# Patient Record
Sex: Female | Born: 1975 | Race: White | Hispanic: No | Marital: Single | Smoking: Current every day smoker
Health system: Southern US, Community
[De-identification: ages and names within clinical notes are randomized; demographics above are authoritative.]

## PROBLEM LIST (undated history)

## (undated) DIAGNOSIS — F329 Major depressive disorder, single episode, unspecified: Secondary | ICD-10-CM

## (undated) DIAGNOSIS — E669 Obesity, unspecified: Secondary | ICD-10-CM

## (undated) DIAGNOSIS — N289 Disorder of kidney and ureter, unspecified: Secondary | ICD-10-CM

## (undated) DIAGNOSIS — F32A Depression, unspecified: Secondary | ICD-10-CM

## (undated) DIAGNOSIS — F319 Bipolar disorder, unspecified: Secondary | ICD-10-CM

## (undated) DIAGNOSIS — M1990 Unspecified osteoarthritis, unspecified site: Secondary | ICD-10-CM

## (undated) DIAGNOSIS — I10 Essential (primary) hypertension: Secondary | ICD-10-CM

## (undated) DIAGNOSIS — G4730 Sleep apnea, unspecified: Secondary | ICD-10-CM

## (undated) DIAGNOSIS — E7800 Pure hypercholesterolemia, unspecified: Secondary | ICD-10-CM

## (undated) DIAGNOSIS — A4902 Methicillin resistant Staphylococcus aureus infection, unspecified site: Secondary | ICD-10-CM

## (undated) HISTORY — PX: FRACTURE SURGERY: SHX138

---

## 2003-04-12 ENCOUNTER — Emergency Department (HOSPITAL_COMMUNITY): Admission: EM | Admit: 2003-04-12 | Discharge: 2003-04-13 | Payer: Self-pay | Admitting: Emergency Medicine

## 2003-04-13 ENCOUNTER — Encounter: Payer: Self-pay | Admitting: Emergency Medicine

## 2004-06-03 ENCOUNTER — Emergency Department (HOSPITAL_COMMUNITY): Admission: EM | Admit: 2004-06-03 | Discharge: 2004-06-03 | Payer: Self-pay | Admitting: Emergency Medicine

## 2010-10-28 ENCOUNTER — Emergency Department (HOSPITAL_COMMUNITY)
Admission: EM | Admit: 2010-10-28 | Discharge: 2010-10-28 | Disposition: A | Discharge status: Home / Self Care | Payer: Self-pay | Attending: Emergency Medicine | Admitting: Emergency Medicine

## 2010-10-28 ENCOUNTER — Inpatient Hospital Stay (INDEPENDENT_AMBULATORY_CARE_PROVIDER_SITE_OTHER): Admit: 2010-10-28 | Discharge: 2010-10-28 | Disposition: A | Discharge status: Home / Self Care | Payer: Self-pay

## 2010-10-28 DIAGNOSIS — M25559 Pain in unspecified hip: Secondary | ICD-10-CM | POA: Insufficient documentation

## 2010-10-28 LAB — POCT PREGNANCY, URINE: Preg Test, Ur: NEGATIVE

## 2014-04-15 ENCOUNTER — Encounter (HOSPITAL_COMMUNITY): Payer: Self-pay | Admitting: Emergency Medicine

## 2014-04-15 ENCOUNTER — Emergency Department (HOSPITAL_COMMUNITY): Payer: Medicaid Other

## 2014-04-15 ENCOUNTER — Inpatient Hospital Stay (HOSPITAL_COMMUNITY)
Admission: EM | Admit: 2014-04-15 | Discharge: 2014-04-19 | DRG: 571 | Disposition: A | Discharge status: Home / Self Care | Payer: Medicaid Other | Attending: Internal Medicine | Admitting: Internal Medicine

## 2014-04-15 DIAGNOSIS — L03119 Cellulitis of unspecified part of limb: Secondary | ICD-10-CM | POA: Diagnosis present

## 2014-04-15 DIAGNOSIS — M65849 Other synovitis and tenosynovitis, unspecified hand: Secondary | ICD-10-CM

## 2014-04-15 DIAGNOSIS — F17200 Nicotine dependence, unspecified, uncomplicated: Secondary | ICD-10-CM | POA: Diagnosis present

## 2014-04-15 DIAGNOSIS — Z6841 Body Mass Index (BMI) 40.0 and over, adult: Secondary | ICD-10-CM

## 2014-04-15 DIAGNOSIS — I10 Essential (primary) hypertension: Secondary | ICD-10-CM | POA: Diagnosis present

## 2014-04-15 DIAGNOSIS — M65839 Other synovitis and tenosynovitis, unspecified forearm: Secondary | ICD-10-CM | POA: Diagnosis present

## 2014-04-15 DIAGNOSIS — E119 Type 2 diabetes mellitus without complications: Secondary | ICD-10-CM | POA: Diagnosis not present

## 2014-04-15 DIAGNOSIS — E669 Obesity, unspecified: Secondary | ICD-10-CM | POA: Diagnosis present

## 2014-04-15 DIAGNOSIS — Z8614 Personal history of Methicillin resistant Staphylococcus aureus infection: Secondary | ICD-10-CM

## 2014-04-15 DIAGNOSIS — Z23 Encounter for immunization: Secondary | ICD-10-CM

## 2014-04-15 DIAGNOSIS — L02519 Cutaneous abscess of unspecified hand: Principal | ICD-10-CM | POA: Diagnosis present

## 2014-04-15 DIAGNOSIS — G4730 Sleep apnea, unspecified: Secondary | ICD-10-CM | POA: Diagnosis present

## 2014-04-15 DIAGNOSIS — Z7982 Long term (current) use of aspirin: Secondary | ICD-10-CM | POA: Diagnosis not present

## 2014-04-15 DIAGNOSIS — B951 Streptococcus, group B, as the cause of diseases classified elsewhere: Secondary | ICD-10-CM | POA: Diagnosis not present

## 2014-04-15 DIAGNOSIS — M069 Rheumatoid arthritis, unspecified: Secondary | ICD-10-CM | POA: Diagnosis present

## 2014-04-15 DIAGNOSIS — L03113 Cellulitis of right upper limb: Secondary | ICD-10-CM | POA: Diagnosis present

## 2014-04-15 HISTORY — DX: Essential (primary) hypertension: I10

## 2014-04-15 HISTORY — DX: Sleep apnea, unspecified: G47.30

## 2014-04-15 HISTORY — DX: Obesity, unspecified: E66.9

## 2014-04-15 HISTORY — DX: Methicillin resistant Staphylococcus aureus infection, unspecified site: A49.02

## 2014-04-15 HISTORY — DX: Unspecified osteoarthritis, unspecified site: M19.90

## 2014-04-15 LAB — CBC WITH DIFFERENTIAL/PLATELET
Basophils Absolute: 0 10*3/uL (ref 0.0–0.1)
Basophils Relative: 1 % (ref 0–1)
Eosinophils Absolute: 0.1 10*3/uL (ref 0.0–0.7)
Eosinophils Relative: 1 % (ref 0–5)
HCT: 35.9 % — ABNORMAL LOW (ref 36.0–46.0)
Hemoglobin: 12.4 g/dL (ref 12.0–15.0)
Lymphocytes Relative: 33 % (ref 12–46)
Lymphs Abs: 2.1 10*3/uL (ref 0.7–4.0)
MCH: 32.5 pg (ref 26.0–34.0)
MCHC: 34.5 g/dL (ref 30.0–36.0)
MCV: 94 fL (ref 78.0–100.0)
Monocytes Absolute: 0.4 10*3/uL (ref 0.1–1.0)
Monocytes Relative: 6 % (ref 3–12)
Neutro Abs: 3.8 10*3/uL (ref 1.7–7.7)
Neutrophils Relative %: 59 % (ref 43–77)
Platelets: 288 10*3/uL (ref 150–400)
RBC: 3.82 MIL/uL — ABNORMAL LOW (ref 3.87–5.11)
RDW: 13.5 % (ref 11.5–15.5)
WBC: 6.4 10*3/uL (ref 4.0–10.5)

## 2014-04-15 LAB — CBG MONITORING, ED: Glucose-Capillary: 132 mg/dL — ABNORMAL HIGH (ref 70–99)

## 2014-04-15 LAB — BASIC METABOLIC PANEL
Anion gap: 14 (ref 5–15)
BUN: 7 mg/dL (ref 6–23)
CO2: 22 mEq/L (ref 19–32)
Calcium: 8.6 mg/dL (ref 8.4–10.5)
Chloride: 97 mEq/L (ref 96–112)
Creatinine, Ser: 0.7 mg/dL (ref 0.50–1.10)
GFR calc Af Amer: >90 mL/min (ref >90)
GFR calc non Af Amer: >90 mL/min (ref >90)
Glucose, Bld: 128 mg/dL — ABNORMAL HIGH (ref 70–99)
Potassium: 3.5 mEq/L — ABNORMAL LOW (ref 3.7–5.3)
Sodium: 133 mEq/L — ABNORMAL LOW (ref 137–147)

## 2014-04-15 LAB — POC URINE PREG, ED: Preg Test, Ur: NEGATIVE

## 2014-04-15 LAB — I-STAT CG4 LACTIC ACID, ED: Lactic Acid, Venous: 2.37 mmol/L — ABNORMAL HIGH (ref 0.5–2.2)

## 2014-04-15 MED ORDER — OXYCODONE-ACETAMINOPHEN 5-325 MG PO TABS
1.0000 | ORAL_TABLET | Freq: Once | ORAL | Status: AC
  Administered 2014-04-15: 1 via ORAL
  Filled 2014-04-15: qty 1

## 2014-04-15 MED ORDER — NICOTINE 21 MG/24HR TD PT24
21.0000 mg | MEDICATED_PATCH | Freq: Every day | TRANSDERMAL | Status: DC
  Administered 2014-04-15 – 2014-04-18 (×4): 21 mg via TRANSDERMAL
  Filled 2014-04-15 (×5): qty 1

## 2014-04-15 MED ORDER — MORPHINE SULFATE 4 MG/ML IJ SOLN
6.0000 mg | Freq: Once | INTRAMUSCULAR | Status: AC
  Administered 2014-04-15: 6 mg via INTRAVENOUS
  Filled 2014-04-15: qty 2

## 2014-04-15 MED ORDER — VANCOMYCIN HCL IN DEXTROSE 1-5 GM/200ML-% IV SOLN
1000.0000 mg | Freq: Two times a day (BID) | INTRAVENOUS | Status: DC
  Administered 2014-04-15 – 2014-04-16 (×2): 1000 mg via INTRAVENOUS
  Filled 2014-04-15 (×3): qty 200

## 2014-04-15 NOTE — ED Notes (Signed)
Reports possible insect bite or abscess to right posterior hand, large amounts of drainage from wound and reports having fever.

## 2014-04-15 NOTE — ED Notes (Signed)
Right hand with redness noted to 4th digit extending from PIP onto hand with scabbed over wound to dorsum and volar aspect of hand extending up to medial/posterior wrist area; area popped up from a pimple like area to hand one week ago per pt.  She admits to expressing pus from area, in fact, she states she pushed on reddened area of wrist/distal forearm and pus was expressed from hand wound.  Has hx: MRSA in past.

## 2014-04-15 NOTE — Progress Notes (Signed)
ANTIBIOTIC CONSULT NOTE - INITIAL  Pharmacy Consult for vancomycin Indication: Cellulitis  No Known Allergies  Patient Measurements: Height: 5\' 3"  (160 cm) Weight: 270 lb (122.471 kg) IBW/kg (Calculated) : 52.4   Vital Signs: Temp: 98.3 F (36.8 C) (08/25 1734) Temp src: Oral (08/25 1734) BP: 162/69 mmHg (08/25 1734) Pulse Rate: 91 (08/25 1734) Intake/Output from previous day:   Intake/Output from this shift:    Labs: No results found for this basename: WBC, HGB, PLT, LABCREA, CREATININE,  in the last 72 hours CrCl is unknown because no creatinine reading has been taken. No results found for this basename: VANCOTROUGH, VANCOPEAK, VANCORANDOM, GENTTROUGH, GENTPEAK, GENTRANDOM, TOBRATROUGH, TOBRAPEAK, TOBRARND, AMIKACINPEAK, AMIKACINTROU, AMIKACIN,  in the last 72 hours   Microbiology: No results found for this or any previous visit (from the past 720 hour(s)).  Medical History: Past Medical History  Diagnosis Date  . Obesity   . Hypertension   . Arthritis   . Sleep apnea   . MRSA (methicillin resistant Staphylococcus aureus)    Assessment: 38 YOF seen with abscess on right hand x1 week- thinks she was bitten by an insect. Reported fever of 104 today at home- currently afebrile. She failed treatment with outpatient antibiotics. WBC 6.4, currently afebrile. SCr 0.7, est CrCl >194mL/min using normalized formula  Goal of Therapy:  Vancomycin trough level 10-15 mcg/ml  Plan:  1. Vancomycin 1000mg  IV q12h per obesity dosing 2. F/u c/s, clinical progression, LOT, renal function and trough at [facility]  Walaa Carel D. Rishita Petron, PharmD, BCPS Clinical Pharmacist Pager: [phone] 04/15/2014 8:33 PM

## 2014-04-15 NOTE — ED Provider Notes (Signed)
TIME SEEN: 8:24 PM  CHIEF COMPLAINT: Right hand abscess  HPI: Patient is a 38 year old right-hand-dominant female with history of hypertension, rheumatoid arthritis medications who presents to the emergency department with complaints of a right hand abscess is been present for the past week. She states she thinks she may have been bitten by an insect but she is not sure. Denies any injury. She has had redness and purulent drainage from any ulcer in the hand. She states she has from at home which she was taking without relief of her symptoms. She states it is getting worse instead of better. She has had a fever of 104 at home. No nausea, vomiting or diarrhea.  PCP none  ROS: See HPI Constitutional:  fever  Eyes: no drainage  ENT: no runny nose   Cardiovascular:  no chest pain  Resp: no SOB  GI: no vomiting GU: no dysuria Integumentary: no rash  Allergy: no hives  Musculoskeletal: no leg swelling  Neurological: no slurred speech ROS otherwise negative  PAST MEDICAL HISTORY/PAST SURGICAL HISTORY:  Past Medical History  Diagnosis Date  . Obesity   . Hypertension   . Arthritis   . Sleep apnea   . MRSA (methicillin resistant Staphylococcus aureus)     MEDICATIONS:  Prior to Admission medications   Not on File    ALLERGIES:  No Known Allergies  SOCIAL HISTORY:  History  Substance Use Topics  . Smoking status: Current Every Day Smoker    Types: Cigarettes  . Smokeless tobacco: Not on file  . Alcohol Use: No    FAMILY HISTORY: History reviewed. No pertinent family history.  EXAM: BP 162/69  Pulse 91  Temp(Src) 98.3 F (36.8 C) (Oral)  Resp 18  Ht 5\' 3"  (1.6 m)  Wt 270 lb (122.471 kg)  BMI 47.84 kg/m2  SpO2 97%  LMP 03/22/2014 CONSTITUTIONAL: Alert and oriented and responds appropriately to questions. Well-appearing; well-nourished HEAD: Normocephalic EYES: Conjunctivae clear, PERRL ENT: normal nose; no rhinorrhea; moist mucous membranes; pharynx without  lesions noted NECK: Supple, no meningismus, no LAD  CARD: RRR; S1 and S2 appreciated; no murmurs, no clicks, no rubs, no gallops RESP: Normal chest excursion without splinting or tachypnea; breath sounds clear and equal bilaterally; no wheezes, no rhonchi, no rales,  ABD/GI: Normal bowel sounds; non-distended; soft, non-tender, no rebound, no guarding BACK:  The back appears normal and is non-tender to palpation, there is no CVA tenderness EXT: Patient has erythema, warmth and pain over her right dorsal hand going into her right distal wrist without joint effusion, she has full range of motion of her fingers and wrist and elbow on the right side, 2+ DP pulses and radial pulses bilaterally, sensation to light touch intact diffusely, patient is a 2 x 1 cm ulcer without purulent drainage over the dorsal hand proximal to the third and fourth MCP, there is no tenderness over the flexor tendons, there is some erythema over the thenar and hyperthenar abscess of the right hand, Normal ROM in all joints; otherwise extremities are non-tender to palpation; no edema; normal capillary refill; no cyanosis    SKIN: Normal color for age and race; warm NEURO: Moves all extremities equally PSYCH: The patient's mood and manner are appropriate. Grooming and personal hygiene are appropriate.  MEDICAL DECISION MAKING: Patient here with right hand cellulitis. Will obtain labs, cultures. We'll obtain an x-ray to evaluate for possible bony involvement. She has tried oral antibiotics at home and states she is getting worse instead of better.  She is having systemic symptoms. We'll give IV vancomycin. Anticipate patient will need admission given she has failed outpatient treatment.    ED PROGRESS: Patient's labs did not show leukocytosis but she does have an elevated lactate. X-ray show no bony destruction, subcutaneous air. There is no obvious abscess on exam that needs to be drained. We'll discuss with medicine for admission.  Patient will likely need a hand surgery consult. Do not feel she needs an emergently as there is no sign of flexor tenosynovitis, abscess that needs drainage.   9:32 PM  Spoke with IM resident service for admission to medical bed, inpatient.  Layla Maw Nasrin Lanzo, DO 04/15/14 2132

## 2014-04-16 ENCOUNTER — Encounter (HOSPITAL_COMMUNITY): Payer: Medicaid Other | Admitting: Anesthesiology

## 2014-04-16 ENCOUNTER — Encounter (HOSPITAL_COMMUNITY)
Admission: EM | Disposition: A | Discharge status: Home / Self Care | Payer: Self-pay | Source: Home / Self Care | Attending: Internal Medicine

## 2014-04-16 ENCOUNTER — Inpatient Hospital Stay (HOSPITAL_COMMUNITY): Payer: Medicaid Other | Admitting: Anesthesiology

## 2014-04-16 ENCOUNTER — Encounter (HOSPITAL_COMMUNITY): Payer: Self-pay | Admitting: *Deleted

## 2014-04-16 DIAGNOSIS — L03113 Cellulitis of right upper limb: Secondary | ICD-10-CM | POA: Diagnosis present

## 2014-04-16 HISTORY — PX: I & D EXTREMITY: SHX5045

## 2014-04-16 LAB — COMPREHENSIVE METABOLIC PANEL WITH GFR
ALT: 30 U/L (ref 0–35)
AST: 42 U/L — ABNORMAL HIGH (ref 0–37)
Albumin: 2.4 g/dL — ABNORMAL LOW (ref 3.5–5.2)
Alkaline Phosphatase: 59 U/L (ref 39–117)
Anion gap: 12 (ref 5–15)
BUN: 8 mg/dL (ref 6–23)
CO2: 23 meq/L (ref 19–32)
Calcium: 8.7 mg/dL (ref 8.4–10.5)
Chloride: 100 meq/L (ref 96–112)
Creatinine, Ser: 0.75 mg/dL (ref 0.50–1.10)
GFR calc Af Amer: >90 mL/min
GFR calc non Af Amer: >90 mL/min
Glucose, Bld: 105 mg/dL — ABNORMAL HIGH (ref 70–99)
Potassium: 3.8 meq/L (ref 3.7–5.3)
Sodium: 135 meq/L — ABNORMAL LOW (ref 137–147)
Total Bilirubin: <0.2 mg/dL — ABNORMAL LOW (ref 0.3–1.2)
Total Protein: 7 g/dL (ref 6.0–8.3)

## 2014-04-16 LAB — MRSA PCR SCREENING: MRSA by PCR: NEGATIVE

## 2014-04-16 LAB — HIV ANTIBODY (ROUTINE TESTING W REFLEX): HIV 1&2 Ab, 4th Generation: NONREACTIVE

## 2014-04-16 LAB — TSH: TSH: 1.85 u[IU]/mL (ref 0.350–4.500)

## 2014-04-16 LAB — HEMOGLOBIN A1C
Hgb A1c MFr Bld: 5.6 % (ref <5.7)
Mean Plasma Glucose: 114 mg/dL (ref <117)

## 2014-04-16 SURGERY — IRRIGATION AND DEBRIDEMENT EXTREMITY
Anesthesia: General | Site: Hand | Laterality: Right

## 2014-04-16 MED ORDER — VANCOMYCIN HCL 10 G IV SOLR
1500.0000 mg | Freq: Two times a day (BID) | INTRAVENOUS | Status: DC
  Administered 2014-04-17 – 2014-04-18 (×4): 1500 mg via INTRAVENOUS
  Filled 2014-04-16 (×6): qty 1500

## 2014-04-16 MED ORDER — PROPOFOL 10 MG/ML IV EMUL
INTRAVENOUS | Status: AC
  Filled 2014-04-16: qty 100

## 2014-04-16 MED ORDER — LACTATED RINGERS IV SOLN
INTRAVENOUS | Status: DC | PRN
  Administered 2014-04-16: 18:00:00 via INTRAVENOUS

## 2014-04-16 MED ORDER — ACETAMINOPHEN 650 MG RE SUPP: 650.0000 mg | Freq: Four times a day (QID) | RECTAL | Status: DC | PRN

## 2014-04-16 MED ORDER — PNEUMOCOCCAL VAC POLYVALENT 25 MCG/0.5ML IJ INJ
0.5000 mL | INJECTION | INTRAMUSCULAR | Status: AC
  Administered 2014-04-17: 0.5 mL via INTRAMUSCULAR
  Filled 2014-04-16 (×2): qty 0.5

## 2014-04-16 MED ORDER — HYDROMORPHONE HCL PF 1 MG/ML IJ SOLN
INTRAMUSCULAR | Status: AC
  Filled 2014-04-16: qty 1

## 2014-04-16 MED ORDER — ENOXAPARIN SODIUM 40 MG/0.4ML ~~LOC~~ SOLN: 40.0000 mg | SUBCUTANEOUS | Status: DC

## 2014-04-16 MED ORDER — SODIUM CHLORIDE 0.9 % IR SOLN
Status: DC | PRN
  Administered 2014-04-16: 3000 mL

## 2014-04-16 MED ORDER — KETOROLAC TROMETHAMINE 15 MG/ML IJ SOLN
INTRAMUSCULAR | Status: AC
  Administered 2014-04-16: 15 mg via INTRAVENOUS
  Filled 2014-04-16: qty 1

## 2014-04-16 MED ORDER — MORPHINE SULFATE 2 MG/ML IJ SOLN
2.0000 mg | Freq: Once | INTRAMUSCULAR | Status: AC
  Administered 2014-04-16: 2 mg via INTRAVENOUS

## 2014-04-16 MED ORDER — 0.9 % SODIUM CHLORIDE (POUR BTL) OPTIME
TOPICAL | Status: DC | PRN
  Administered 2014-04-16: 2000 mL

## 2014-04-16 MED ORDER — HEPARIN SODIUM (PORCINE) 5000 UNIT/ML IJ SOLN
5000.0000 [IU] | Freq: Three times a day (TID) | INTRAMUSCULAR | Status: DC
  Administered 2014-04-16 – 2014-04-19 (×9): 5000 [IU] via SUBCUTANEOUS
  Filled 2014-04-16 (×15): qty 1

## 2014-04-16 MED ORDER — ONDANSETRON HCL 4 MG/2ML IJ SOLN
INTRAMUSCULAR | Status: DC | PRN
  Administered 2014-04-16: 4 mg via INTRAVENOUS

## 2014-04-16 MED ORDER — PROPOFOL 10 MG/ML IV BOLUS
INTRAVENOUS | Status: DC | PRN
  Administered 2014-04-16: 300 mg via INTRAVENOUS

## 2014-04-16 MED ORDER — HYDROMORPHONE HCL PF 1 MG/ML IJ SOLN
0.2500 mg | INTRAMUSCULAR | Status: AC | PRN
  Administered 2014-04-16 (×8): 0.5 mg via INTRAVENOUS

## 2014-04-16 MED ORDER — MIDAZOLAM HCL 5 MG/5ML IJ SOLN
INTRAMUSCULAR | Status: DC | PRN
  Administered 2014-04-16: 2 mg via INTRAVENOUS

## 2014-04-16 MED ORDER — HYDROMORPHONE HCL PF 1 MG/ML IJ SOLN
INTRAMUSCULAR | Status: AC
  Administered 2014-04-16: 0.5 mg via INTRAVENOUS
  Filled 2014-04-16: qty 1

## 2014-04-16 MED ORDER — ONDANSETRON HCL 4 MG PO TABS: 4.0000 mg | ORAL_TABLET | Freq: Four times a day (QID) | ORAL | Status: DC | PRN

## 2014-04-16 MED ORDER — ONDANSETRON HCL 4 MG/2ML IJ SOLN: 4.0000 mg | Freq: Four times a day (QID) | INTRAMUSCULAR | Status: DC | PRN

## 2014-04-16 MED ORDER — LACTATED RINGERS IV SOLN
INTRAVENOUS | Status: DC
  Administered 2014-04-16: 17:00:00 via INTRAVENOUS

## 2014-04-16 MED ORDER — SODIUM CHLORIDE 0.9 % IV SOLN
INTRAVENOUS | Status: DC
  Administered 2014-04-17: 05:00:00 via INTRAVENOUS

## 2014-04-16 MED ORDER — ONDANSETRON HCL 4 MG/2ML IJ SOLN: 4.0000 mg | Freq: Once | INTRAMUSCULAR | Status: DC | PRN

## 2014-04-16 MED ORDER — FENTANYL CITRATE 0.05 MG/ML IJ SOLN
INTRAMUSCULAR | Status: DC | PRN
  Administered 2014-04-16: 100 ug via INTRAVENOUS
  Administered 2014-04-16 (×2): 50 ug via INTRAVENOUS
  Administered 2014-04-16 (×2): 100 ug via INTRAVENOUS
  Administered 2014-04-16 (×2): 50 ug via INTRAVENOUS

## 2014-04-16 MED ORDER — ASPIRIN EC 81 MG PO TBEC
81.0000 mg | DELAYED_RELEASE_TABLET | Freq: Every day | ORAL | Status: DC
  Administered 2014-04-17 – 2014-04-19 (×3): 81 mg via ORAL
  Filled 2014-04-16 (×4): qty 1

## 2014-04-16 MED ORDER — OXYCODONE-ACETAMINOPHEN 5-325 MG PO TABS
1.0000 | ORAL_TABLET | ORAL | Status: DC | PRN
  Administered 2014-04-16 (×3): 2 via ORAL
  Filled 2014-04-16 (×3): qty 2

## 2014-04-16 MED ORDER — KETOROLAC TROMETHAMINE 15 MG/ML IJ SOLN
15.0000 mg | Freq: Four times a day (QID) | INTRAMUSCULAR | Status: DC | PRN
  Administered 2014-04-16 – 2014-04-18 (×9): 15 mg via INTRAVENOUS
  Filled 2014-04-16 (×8): qty 1

## 2014-04-16 MED ORDER — MORPHINE SULFATE 4 MG/ML IJ SOLN
4.0000 mg | INTRAMUSCULAR | Status: DC | PRN
  Administered 2014-04-16 – 2014-04-17 (×4): 4 mg via INTRAVENOUS
  Filled 2014-04-16 (×4): qty 1

## 2014-04-16 MED ORDER — LIDOCAINE HCL (CARDIAC) 20 MG/ML IV SOLN
INTRAVENOUS | Status: DC | PRN
  Administered 2014-04-16: 50 mg via INTRAVENOUS

## 2014-04-16 MED ORDER — MIDAZOLAM HCL 2 MG/2ML IJ SOLN
INTRAMUSCULAR | Status: AC
  Filled 2014-04-16: qty 2

## 2014-04-16 MED ORDER — FENTANYL CITRATE 0.05 MG/ML IJ SOLN
INTRAMUSCULAR | Status: AC
  Filled 2014-04-16: qty 5

## 2014-04-16 MED ORDER — ACETAMINOPHEN 325 MG PO TABS: 650.0000 mg | ORAL_TABLET | Freq: Four times a day (QID) | ORAL | Status: DC | PRN

## 2014-04-16 MED ORDER — PROPOFOL INFUSION 10 MG/ML OPTIME
INTRAVENOUS | Status: DC | PRN
  Administered 2014-04-16: 125 ug/kg/min via INTRAVENOUS

## 2014-04-16 MED ORDER — MORPHINE SULFATE 2 MG/ML IJ SOLN
INTRAMUSCULAR | Status: AC
  Filled 2014-04-16: qty 1

## 2014-04-16 SURGICAL SUPPLY — 54 items
BANDAGE ELASTIC 3 VELCRO ST LF (GAUZE/BANDAGES/DRESSINGS) ×1 IMPLANT
BANDAGE ELASTIC 4 VELCRO ST LF (GAUZE/BANDAGES/DRESSINGS) ×2 IMPLANT
BNDG CMPR 9X4 STRL LF SNTH (GAUZE/BANDAGES/DRESSINGS) ×1
BNDG COHESIVE 1X5 TAN STRL LF (GAUZE/BANDAGES/DRESSINGS) IMPLANT
BNDG CONFORM 2 STRL LF (GAUZE/BANDAGES/DRESSINGS) IMPLANT
BNDG ESMARK 4X9 LF (GAUZE/BANDAGES/DRESSINGS) ×2 IMPLANT
BNDG GAUZE ELAST 4 BULKY (GAUZE/BANDAGES/DRESSINGS) ×2 IMPLANT
CORDS BIPOLAR (ELECTRODE) ×2 IMPLANT
COVER SURGICAL LIGHT HANDLE (MISCELLANEOUS) ×2 IMPLANT
CUFF TOURNIQUET SINGLE 18IN (TOURNIQUET CUFF) ×2 IMPLANT
CUFF TOURNIQUET SINGLE 24IN (TOURNIQUET CUFF) IMPLANT
DRAIN PENROSE 1/4X12 LTX STRL (WOUND CARE) IMPLANT
DRAPE SURG 17X23 STRL (DRAPES) ×2 IMPLANT
DRSG ADAPTIC 3X8 NADH LF (GAUZE/BANDAGES/DRESSINGS) ×2 IMPLANT
ELECT REM PT RETURN 9FT ADLT (ELECTROSURGICAL)
ELECTRODE REM PT RTRN 9FT ADLT (ELECTROSURGICAL) IMPLANT
GAUZE SPONGE 4X4 12PLY STRL (GAUZE/BANDAGES/DRESSINGS) ×2 IMPLANT
GAUZE XEROFORM 1X8 LF (GAUZE/BANDAGES/DRESSINGS) ×1 IMPLANT
GAUZE XEROFORM 5X9 LF (GAUZE/BANDAGES/DRESSINGS) IMPLANT
GLOVE BIOGEL PI IND STRL 8.5 (GLOVE) ×1 IMPLANT
GLOVE BIOGEL PI INDICATOR 8.5 (GLOVE) ×1
GLOVE SURG ORTHO 8.0 STRL STRW (GLOVE) ×2 IMPLANT
GOWN STRL REUS W/ TWL LRG LVL3 (GOWN DISPOSABLE) ×3 IMPLANT
GOWN STRL REUS W/ TWL XL LVL3 (GOWN DISPOSABLE) ×1 IMPLANT
GOWN STRL REUS W/TWL LRG LVL3 (GOWN DISPOSABLE) ×6
GOWN STRL REUS W/TWL XL LVL3 (GOWN DISPOSABLE) ×2
HANDPIECE INTERPULSE COAX TIP (DISPOSABLE)
KIT BASIN OR (CUSTOM PROCEDURE TRAY) ×2 IMPLANT
KIT ROOM TURNOVER OR (KITS) ×2 IMPLANT
MANIFOLD NEPTUNE II (INSTRUMENTS) ×1 IMPLANT
NDL HYPO 25GX1X1/2 BEV (NEEDLE) IMPLANT
NEEDLE HYPO 25GX1X1/2 BEV (NEEDLE) IMPLANT
NS IRRIG 1000ML POUR BTL (IV SOLUTION) ×3 IMPLANT
PACK ORTHO EXTREMITY (CUSTOM PROCEDURE TRAY) ×2 IMPLANT
PAD ARMBOARD 7.5X6 YLW CONV (MISCELLANEOUS) ×4 IMPLANT
PAD CAST 4YDX4 CTTN HI CHSV (CAST SUPPLIES) ×1 IMPLANT
PADDING CAST COTTON 4X4 STRL (CAST SUPPLIES) ×2
SET HNDPC FAN SPRY TIP SCT (DISPOSABLE) IMPLANT
SOAP 2 % CHG 4 OZ (WOUND CARE) ×2 IMPLANT
SPLINT FIBERGLASS 4X30 (CAST SUPPLIES) ×1 IMPLANT
SPONGE LAP 18X18 X RAY DECT (DISPOSABLE) ×2 IMPLANT
SPONGE LAP 4X18 X RAY DECT (DISPOSABLE) ×1 IMPLANT
SUCTION FRAZIER TIP 10 FR DISP (SUCTIONS) ×2 IMPLANT
SUT ETHILON 4 0 PS 2 18 (SUTURE) ×2 IMPLANT
SUT ETHILON 5 0 P 3 18 (SUTURE)
SUT NYLON ETHILON 5-0 P-3 1X18 (SUTURE) ×1 IMPLANT
SYR CONTROL 10ML LL (SYRINGE) IMPLANT
TOWEL OR 17X24 6PK STRL BLUE (TOWEL DISPOSABLE) ×2 IMPLANT
TOWEL OR 17X26 10 PK STRL BLUE (TOWEL DISPOSABLE) ×2 IMPLANT
TUBE ANAEROBIC SPECIMEN COL (MISCELLANEOUS) ×1 IMPLANT
TUBE CONNECTING 12X1/4 (SUCTIONS) ×2 IMPLANT
UNDERPAD 30X30 INCONTINENT (UNDERPADS AND DIAPERS) ×2 IMPLANT
WATER STERILE IRR 1000ML POUR (IV SOLUTION) ×2 IMPLANT
YANKAUER SUCT BULB TIP NO VENT (SUCTIONS) ×2 IMPLANT

## 2014-04-16 NOTE — Addendum Note (Signed)
Addendum created 04/16/14 2001 by Laverle Hobby, MD   Modules edited: Anesthesia Attestations, Anesthesia Events, Anesthesia Review and Sign Navigator Section, Clinical Notes, Problem List   Clinical Notes:  File: [id number]   Problem List:  Loraine Leriche As Reviewed

## 2014-04-16 NOTE — Anesthesia Postprocedure Evaluation (Signed)
  Anesthesia Post-op Note  Patient: Judy Robles  Procedure(s) Performed: Procedure(s): IRRIGATION AND DEBRIDEMENT EXTREMITY (Right)  Patient Location: PACU  Anesthesia Type:General  Level of Consciousness: awake, alert , oriented and patient cooperative  Airway and Oxygen Therapy: Patient Spontanous Breathing  Post-op Pain: moderate  Post-op Assessment: Post-op Vital signs reviewed, Patient's Cardiovascular Status Stable, Respiratory Function Stable, Patent Airway and No signs of Nausea or vomiting  Post-op Vital Signs: stable  Last Vitals:  Filed Vitals:   04/16/14 1945  BP: 193/98  Pulse: 58  Temp:   Resp: 13    Complications: No apparent anesthesia complications

## 2014-04-16 NOTE — Op Note (Signed)
Judy Robles, Judy Robles                 ACCOUNT NO.:  [id number]  MEDICAL RECORD NO.:  [id number]  LOCATION:  [facility]                         FACILITY:  [facility]  PHYSICIAN:  Madelynn Done, MD  DATE OF BIRTH:  [birth year]  DATE OF PROCEDURE:  04/16/2014 DATE OF DISCHARGE:                              OPERATIVE REPORT   PREOPERATIVE DIAGNOSES: 1. Right hand abscess. 2. Right ring finger extensor tenosynovitis.  POSTOPERATIVE DIAGNOSES: 1. Right hand abscess. 2. Right ring finger extensor tenosynovitis.  ATTENDING PHYSICIAN:  Madelynn Done, M.D., who scrubbed and was present for the entire procedure.  ASSISTANT SURGEON:  None.  ANESTHESIA:  General via LMA.  SURGICAL PROCEDURES: 1. Right hand incision and drainage of dorsal abscess. 2. Right hand extensor tenosynovectomy. 3. Debridement of skin, subcutaneous tissue, excisional debridement,     right hand.  SURGICAL INDICATIONS:  [name] is a right-hand dominant female who had a worsening infection of the dorsal aspect of her right hand.  The patient was seen and evaluated by the Internal Medicine Service, admitted for IV antibiotics.  The patient has persistence of draining wound and after evaluation, it was recommended that the patient undergo the above procedure.  Risks, benefits, and alternatives were discussed in detail with the patient.  Signed informed consent was obtained. Risks include, but not limited to, bleeding; infection; damage to nearby nerves, arteries, or tendons; loss of motion of the wrist and digits; incomplete relief of symptoms; and need for further surgical intervention.  DESCRIPTION OF PROCEDURE:  The patient was properly identified in the preoperative holding area and marked with a permanent marker made on the right hand to indicate the correct operative site.  The patient was then brought back to the operating room, placed in supine on the anesthesia room table, where general anesthesia was  administered.  The patient tolerated this well.  A well-padded tourniquet was then placed on the right brachium and sealed with 1000 drape.  Right upper extremity was then prepped and draped in normal sterile fashion.  Time-out was called, correct site was identified, and the procedure was then begun. Attention was then turned to the right hand where a longitudinal incision was made directly over the ring finger metacarpal, extending into the phalangeal region.  Dissection was carried down through the skin, subcutaneous tissue, where wound cultures were then taken. Portion of the tissue was then sent for a tissue culture.  A moderate amount of purulence was encountered.  Following this, the deep dissection was carried down to the extensor mechanism where the patient did have an abundant proliferative tissue along the course of the extensor mechanism.  This was sharply debrided using knives and rongeurs.  After extensor tenosynovectomy, the wound was then thoroughly irrigated.  Copious wound irrigation ran throughout the wound.  The skin and subcutaneous tissue where the ulceration over the dorsal aspect of the hand was then excised.  This was excised in elliptical fashion sharply with knives.  Debridement was also carried out with sharp scissors and rongeurs.  After drainage and debridement, the wound was irrigated.  Skin was then loosely reapproximated with 4-0 Prolene sutures.  Adaptic dressing and sterile  compressive bandage were then applied.  The patient tolerated the procedure well, returned to the recovery room.  After being placed in a short arm volar splint, extubated, and taken to the recovery room in good condition.  POSTPROCEDURE PLAN:  The patient was admitted back to the Internal Medicine Service, IV antibiotics and pain control.  Discharge when cultures come back and then based on the antibiotics and based on the cultures, we will look at the wound in several days.  We  will continue to follow her as an inpatient and monitor.  If she digresses, the patient may require repeat I and D.     Madelynn Done, MD     FWO/MEDQ  D:  04/16/2014  T:  04/16/2014  Job:  [id number]

## 2014-04-16 NOTE — Brief Op Note (Signed)
04/15/2014 - 04/16/2014  6:44 PM  PATIENT:  [name]  38 y.o. female  PRE-OPERATIVE DIAGNOSIS:  Right Hand Abscess  POST-OPERATIVE DIAGNOSIS:  Right Hand Abscess  PROCEDURE:  Procedure(s): IRRIGATION AND DEBRIDEMENT EXTREMITY (Right)  SURGEON:  Surgeon(s) and Role:    * Sharma Covert, MD - Primary  PHYSICIAN ASSISTANT:   ASSISTANTS: none   ANESTHESIA:   general  EBL:  Total I/O In: 840 [P.O.:340; I.V.:500] Out: 900 [Urine:900]  BLOOD ADMINISTERED:none  DRAINS: none   LOCAL MEDICATIONS USED:  NONE  SPECIMEN:  No Specimen  DISPOSITION OF SPECIMEN:  N/A  COUNTS:  YES  TOURNIQUET:    DICTATION: .Other Dictation: Dictation Number [id number]  PLAN OF CARE: Admit to inpatient   PATIENT DISPOSITION:  PACU - hemodynamically stable.   Delay start of Pharmacological VTE agent (>24hrs) due to surgical blood loss or risk of bleeding: not applicable

## 2014-04-16 NOTE — H&P (Signed)
INTERNAL MEDICINE TEACHING ATTENDING NOTE  Day 1 of stay  Patient name: Judy Robles  MRN: [id number] Date of birth: 1975/09/14   Key clinical points and exam                                                           38 y.o.with hand cellulitis. Met with patient, examined hand. Agree with findings per Dr Randell Patient. Patient reports that she feels slightly improved. Her local exam of right hand reveals an open wound 2 x1 cm on the dorsal surface of hand, purulent drainage, with swelling of the palm and digits, possible extension to the wrist. Good radial pulse. Sensation intact in the hand. Tenderness ++. ROM of fingers and wrist limited in all directions.   I have reviewed the chart, lab results, EKG, imaging and relevant notes of this patient.   Assessment and Plan                                                                      I am worried that the infection might be deeper than just superficial cellulitis. I would like to rule out abscess formation, or pyogenic flexor tenosynovitis. and  and In addition to the plan by housestaff, I would like to add:   - Consult hand surgery for input - consult required today.   - Continue vancomycin. If no improvement in 48 hours, broaden coverage.   - CT hand and wrist to ascertain abscess formation and extension.    - Await blood culture and wound culture reports.   - Change pain control to dilaudid.   - Add insulin coverage if needed (FBS >126, PBS>200, A1c>6.5).    I have seen and evaluated this patient and discussed it with my IM resident team.  Please see the rest of the plan per resident note from today.   [place], Kathryn 04/16/2014, 11:50 AM.

## 2014-04-16 NOTE — Consult Note (Signed)
Reason for Consult:right hand infection Referring Physician: IM service  Judy Robles is an 38 y.o. female.  HPI: Patient is a 38 year old right-hand-dominant female with history of hypertension, rheumatoid arthritis medications who presents to the emergency department with complaints of a right hand abscess is been present for the past week. She states she thinks she may have been bitten by an insect but she is not sure. Denies any injury. She has had redness and purulent drainage from any ulcer in the hand. She states she has from at home which she was taking without relief of her symptoms. She states it is getting worse instead of better. She has had a fever of 104 at home. No nausea, vomiting or diarrhea. SHE WAS ADMITTED OVERNIGHT TO THE IM SERVICE I WAS CONSULTED FOR MANAGEMENT OF HER RIGHT HAND   Past Medical History  Diagnosis Date  . Obesity   . Hypertension   . Arthritis   . Sleep apnea   . MRSA (methicillin resistant Staphylococcus aureus)     History reviewed. No pertinent past surgical history.  History reviewed. No pertinent family history.  Social History:  reports that she has been smoking Cigarettes.  She has been smoking about 0.00 packs per day. She does not have any smokeless tobacco history on file. She reports that she does not drink alcohol or use illicit drugs.  Allergies: No Known Allergies  Medications: I have reviewed the patient's current medications.  Results for orders placed during the hospital encounter of 04/15/14 (from the past 48 hour(s))  CBC WITH DIFFERENTIAL     Status: Abnormal   Collection Time    04/15/14  8:17 PM      Result Value Ref Range   WBC 6.4  4.0 - 10.5 K/uL   RBC 3.82 (*) 3.87 - 5.11 MIL/uL   Hemoglobin 12.4  12.0 - 15.0 g/dL   HCT 35.9 (*) 36.0 - 46.0 %   MCV 94.0  78.0 - 100.0 fL   MCH 32.5  26.0 - 34.0 pg   MCHC 34.5  30.0 - 36.0 g/dL   RDW 13.5  11.5 - 15.5 %   Platelets 288  150 - 400 K/uL   Neutrophils Relative % 59   43 - 77 %   Neutro Abs 3.8  1.7 - 7.7 K/uL   Lymphocytes Relative 33  12 - 46 %   Lymphs Abs 2.1  0.7 - 4.0 K/uL   Monocytes Relative 6  3 - 12 %   Monocytes Absolute 0.4  0.1 - 1.0 K/uL   Eosinophils Relative 1  0 - 5 %   Eosinophils Absolute 0.1  0.0 - 0.7 K/uL   Basophils Relative 1  0 - 1 %   Basophils Absolute 0.0  0.0 - 0.1 K/uL  BASIC METABOLIC PANEL     Status: Abnormal   Collection Time    04/15/14  8:17 PM      Result Value Ref Range   Sodium 133 (*) 137 - 147 mEq/L   Potassium 3.5 (*) 3.7 - 5.3 mEq/L   Chloride 97  96 - 112 mEq/L   CO2 22  19 - 32 mEq/L   Glucose, Bld 128 (*) 70 - 99 mg/dL   BUN 7  6 - 23 mg/dL   Creatinine, Ser 0.70  0.50 - 1.10 mg/dL   Calcium 8.6  8.4 - 10.5 mg/dL   GFR calc non Af Amer >90  >90 mL/min   GFR calc Af Amer >90  >  90 mL/min   Comment: (NOTE)     The eGFR has been calculated using the CKD EPI equation.     This calculation has not been validated in all clinical situations.     eGFR's persistently <90 mL/min signify possible Chronic Kidney     Disease.   Anion gap 14  5 - 15  CBG MONITORING, ED     Status: Abnormal   Collection Time    04/15/14  8:50 PM      Result Value Ref Range   Glucose-Capillary 132 (*) 70 - 99 mg/dL  I-STAT CG4 LACTIC ACID, ED     Status: Abnormal   Collection Time    04/15/14  8:50 PM      Result Value Ref Range   Lactic Acid, Venous 2.37 (*) 0.5 - 2.2 mmol/L  POC URINE PREG, ED     Status: None   Collection Time    04/15/14 11:37 PM      Result Value Ref Range   Preg Test, Ur NEGATIVE  NEGATIVE   Comment:            THE SENSITIVITY OF THIS     METHODOLOGY IS >24 mIU/mL  MRSA PCR SCREENING     Status: None   Collection Time    04/16/14 12:54 AM      Result Value Ref Range   MRSA by PCR NEGATIVE  NEGATIVE   Comment:            The GeneXpert MRSA Assay (FDA     approved for NASAL specimens     only), is one component of a     comprehensive MRSA colonization     surveillance program. It is not      intended to diagnose MRSA     infection nor to guide or     monitor treatment for     MRSA infections.  TSH     Status: None   Collection Time    04/16/14  6:05 AM      Result Value Ref Range   TSH 1.850  0.350 - 4.500 uIU/mL  HEMOGLOBIN A1C     Status: None   Collection Time    04/16/14  6:05 AM      Result Value Ref Range   Hemoglobin A1C 5.6  <5.7 %   Comment: (NOTE)                                                                               According to the ADA Clinical Practice Recommendations for 2011, when     HbA1c is used as a screening test:      >=6.5%   Diagnostic of Diabetes Mellitus               (if abnormal result is confirmed)     5.7-6.4%   Increased risk of developing Diabetes Mellitus     References:Diagnosis and Classification of Diabetes Mellitus,Diabetes     OITG,5498,26(EBRAX 1):S62-S69 and Standards of Medical Care in             Diabetes - 2011,Diabetes Care,2011,34 (Suppl 1):S11-S61.   Mean Plasma Glucose 114  <117 mg/dL   Comment:  Performed at [facility]     Status: Abnormal   Collection Time    04/16/14  6:05 AM      Result Value Ref Range   Sodium 135 (*) 137 - 147 mEq/L   Potassium 3.8  3.7 - 5.3 mEq/L   Chloride 100  96 - 112 mEq/L   CO2 23  19 - 32 mEq/L   Glucose, Bld 105 (*) 70 - 99 mg/dL   BUN 8  6 - 23 mg/dL   Creatinine, Ser 0.75  0.50 - 1.10 mg/dL   Calcium 8.7  8.4 - 10.5 mg/dL   Total Protein 7.0  6.0 - 8.3 g/dL   Albumin 2.4 (*) 3.5 - 5.2 g/dL   AST 42 (*) 0 - 37 U/L   ALT 30  0 - 35 U/L   Alkaline Phosphatase 59  39 - 117 U/L   Total Bilirubin <0.2 (*) 0.3 - 1.2 mg/dL   GFR calc non Af Amer >90  >90 mL/min   GFR calc Af Amer >90  >90 mL/min   Comment: (NOTE)     The eGFR has been calculated using the CKD EPI equation.     This calculation has not been validated in all clinical situations.     eGFR's persistently <90 mL/min signify possible Chronic Kidney     Disease.   Anion gap  12  5 - 15  HIV ANTIBODY (ROUTINE TESTING)     Status: None   Collection Time    04/16/14  6:05 AM      Result Value Ref Range   HIV 1&2 Ab, 4th Generation NONREACTIVE  NONREACTIVE   Comment: (NOTE)     A NONREACTIVE HIV Ag/Ab result does not exclude HIV infection since     the time frame for seroconversion is variable. If acute HIV infection     is suspected, a HIV-1 RNA Qualitative TMA test is recommended.     HIV-1/2 Antibody Diff         Not indicated.     HIV-1 RNA, Qual TMA           Not indicated.     PLEASE NOTE: This information has been disclosed to you from records     whose confidentiality may be protected by state law. If your state     requires such protection, then the state law prohibits you from making     any further disclosure of the information without the specific written     consent of the person to whom it pertains, or as otherwise permitted     by law. A general authorization for the release of medical or other     information is NOT sufficient for this purpose.     The performance of this assay has not been clinically validated in     patients less than 3 years old.     Performed at [facility]    Dg Hand Complete Right  04/15/2014   CLINICAL DATA:  Right hand abscess around the fifth MCP. Redness and swelling down the wrist.  EXAM: RIGHT HAND - COMPLETE 3+ VIEW  COMPARISON:  10/13/2012  FINDINGS: Soft tissue swelling with soft tissue gas demonstrated superficial to the dorsal aspect of the fifth metacarpal bone. This is consistent with clinical history of abscess. No underlying bone changes to suggest osteomyelitis. No radiopaque soft tissue foreign bodies. No evidence of acute fracture or dislocation.  IMPRESSION: Soft tissue swelling  and focal soft tissue gas over the fifth metacarpal region. No acute bony abnormalities.   Electronically Signed   By: Lucienne Capers M.D.   On: 04/15/2014 21:23    ROS AS PER ADMISSION H/ AND P Blood pressure 169/69,  pulse 72, temperature 98.2 F (36.8 C), temperature source Oral, resp. rate 18, height _0  (1.6 m), weight 122.471 kg (270 lb), last menstrual period 03/22/2014, SpO2 98.00%. Physical Exam General Appearance:  Alert, cooperative, no distress, appears stated age  Head:  Normocephalic, without obvious abnormality, atraumatic  Eyes:  Pupils equal, conjunctiva/corneas clear,         Throat: Lips, mucosa, and tongue normal; teeth and gums normal  Neck: No visible masses     Lungs:   respirations unlabored  Chest Wall:  No tenderness or deformity  Heart:  Regular rate and rhythm,  Abdomen:   Soft, non-tender,         Extremities: RIGHT HAND: PHOTO OF HAND IN MEDIA SECTION REVIEWED AND DOCUMENTS EXTENT OF INFECTION OVER DORSUM OF HAND AND DISTAL FOREARM AND DORSUM OF RIGHT RING FINGER  Pulses: 2+ and symmetric  Skin: Skin color, texture, turgor normal, no rashes or lesions     Neurologic: Normal    Assessment/Plan: RIGHT HAND AND DISTAL FOREARM ABSCESS  RIGHT HAND INCISION AND DRAINAGE/DEBRIDEMENT  R/B/A DISCUSSED WITH PT IN HOSPITAL.  PT VOICED UNDERSTANDING OF PLAN CONSENT SIGNED DAY OF SURGERY PT SEEN AND EXAMINED PRIOR TO OPERATIVE PROCEDURE/DAY OF SURGERY SITE MARKED. QUESTIONS ANSWERED WILL REMAIN IN HOSPITAL  FOLLOWING SURGERY WE ARE PLANNING SURGERY FOR YOUR UPPER EXTREMITY. THE RISKS AND BENEFITS OF SURGERY INCLUDE BUT NOT LIMITED TO BLEEDING INFECTION, DAMAGE TO NEARBY NERVES ARTERIES TENDONS, FAILURE OF SURGERY TO ACCOMPLISH ITS INTENDED GOALS, PERSISTENT SYMPTOMS AND NEED FOR FURTHER SURGICAL INTERVENTION. WITH THIS IN MIND WE WILL PROCEED. I HAVE DISCUSSED WITH THE PATIENT THE PRE AND POSTOPERATIVE REGIMEN AND THE DOS AND DON'TS. PT VOICED UNDERSTANDING AND INFORMED CONSENT SIGNED. Linna Hoff 04/16/2014, 6:41 PM

## 2014-04-16 NOTE — Progress Notes (Signed)
ANTIBIOTIC CONSULT NOTE - Follow-up  Pharmacy Consult for vancomycin Indication: Cellulitis  No Known Allergies  Patient Measurements: Height: 5\' 3"  (160 cm) Weight: 270 lb (122.471 kg) IBW/kg (Calculated) : 52.4   Vital Signs: Temp: 98.1 F (36.7 C) (08/26 0446) Temp src: Oral (08/26 0446) BP: 158/80 mmHg (08/26 0511) Pulse Rate: 66 (08/26 0446) Intake/Output from previous day: 08/25 0701 - 08/26 0700 In: 440 [P.O.:240; IV Piggyback:200] Out: -  Intake/Output from this shift: Total I/O In: 340 [P.O.:340] Out: -   Labs:  Recent Labs  04/15/14 2017 04/16/14 0605  WBC 6.4  --   HGB 12.4  --   PLT 288  --   CREATININE 0.70 0.75   Estimated Creatinine Clearance: 118.6 ml/min (by C-G formula based on Cr of 0.75). No results found for this basename: VANCOTROUGH, 04/18/14, VANCORANDOM, GENTTROUGH, GENTPEAK, GENTRANDOM, TOBRATROUGH, TOBRAPEAK, TOBRARND, AMIKACINPEAK, AMIKACINTROU, AMIKACIN,  in the last 72 hours   Microbiology: Recent Results (from the past 720 hour(s))  MRSA PCR SCREENING     Status: None   Collection Time    04/16/14 12:54 AM      Result Value Ref Range Status   MRSA by PCR NEGATIVE  NEGATIVE Final   Comment:            The GeneXpert MRSA Assay (FDA     approved for NASAL specimens     only), is one component of a     comprehensive MRSA colonization     surveillance program. It is not     intended to diagnose MRSA     infection nor to guide or     monitor treatment for     MRSA infections.   Assessment: 38 YOF on Vancomycin (Day #1) for R hand abscess. Noted pt with h/o MRSA abscess. WBC wnl, Tm 99.6. LA 2.37. Appears that vancomycin was not loaded initially (typically need load in obese pt) so level will likely be low. Will plan to increase maintenance dose and check level at Css. SCr stable, est CrCl >100 ml/min  vanc 8/25>>  8/26 Wound>> 8/25 Bld x2>>  Goal of Therapy:  Vancomycin trough level 10-15 mcg/ml  Plan:  1. Increase  Vancomycin to 1500mg  IV q12h 2. F/u c/s, clinical progression, LOT, renal function and trough at [facility]  , PharmD, BCPS Clinical pharmacist, pager [phone] 04/16/2014 11:13 AM

## 2014-04-16 NOTE — H&P (Signed)
Date: 04/16/2014               Patient Name:  Judy Robles MRN: [id number]  DOB: [birth year] Age / Sex: 38 y.o., female   PCP: No Pcp Per Patient         Medical Service: Internal Medicine Teaching Service         Attending Physician: Dr. Aletta Edouard, MD    First Contact: Dr. Glenard Haring Pager: [phone]  Second Contact: Dr. Zada Girt Pager: [phone]       After Hours (After 5p/  First Contact Pager: [phone]  weekends / holidays): Second Contact Pager: [phone]   Chief Complaint: R hand cellulitis and abscess  History of Present Illness: Judy Robles is a 37 year old woman with history of HTN, rheumatoid arthritis, MRSA presenting with right hand abscess x 1 week. She had been working outside. She noticed a small red, pruritic bump. She thought she had an insect bite but never saw an insect. She has been using neosporin on it. It increased in size and eventually spontaneously erupted with yellow pus. The surrounding cellulitis has also been increasing. She reports pain  In her R hand. Denies joint swelling. She had a previous similar abscess on her lower abdomen for which she had leftover bactrim. She took about 3 days worth with some improvement of the abscess and cellulitis but hasn't had any antibiotics since Saturday. No sick contacts. Reports fever to 104 Saturday, chills, nausea, vomiting. Denies chest pain, shortness of breath, abdominal pain, diarrhea, dysuria.   Also notes that she was diagnosed with DM2 but is not on medication. Reports polydipsia and polyuria.  Meds: Current Facility-Administered Medications  Medication Dose Route Frequency Provider Last Rate Last Dose  . nicotine (NICODERM CQ - dosed in mg/24 hours) patch 21 mg  21 mg Transdermal Daily Christen Bame, MD   21 mg at 04/15/14 2223  . vancomycin (VANCOCIN) IVPB 1000 mg/200 mL premix  1,000 mg Intravenous Q12H Lauren Bajbus, RPH 200 mL/hr at 04/15/14 2223 1,000 mg at 04/15/14 2223   Current Outpatient Prescriptions    Medication Sig Dispense Refill  . acetaminophen (TYLENOL) 325 MG tablet Take 650 mg by mouth every 6 (six) hours as needed for mild pain.      . hydrocortisone cream 0.5 % Apply 1 application topically 2 (two) times daily.      . silver sulfADIAZINE (SILVADENE) 1 % cream Apply 1 application topically daily.        Allergies: Allergies as of 04/15/2014  . (No Known Allergies)   Past Medical History  Diagnosis Date  . Obesity   . Hypertension   . Arthritis   . Sleep apnea   . MRSA (methicillin resistant Staphylococcus aureus)    History reviewed. No pertinent past surgical history.  History reviewed. No pertinent family history.  History   Social History  . Marital Status: Single    Spouse Name: N/A    Number of Children: N/A  . Years of Education: N/A   Occupational History  . Not on file.   Social History Main Topics  . Smoking status: Current Every Day Smoker    Types: Cigarettes  . Smokeless tobacco: Not on file  . Alcohol Use: No  . Drug Use: No  . Sexual Activity: Not on file   Other Topics Concern  . Not on file   Social History Narrative  . No narrative on file  Recently moved here from [place]. Was previously followed by Dr. Martha Clan when  she still had insurance. Currently applying for medicaid. More recently seen by Dr. Clovis Riley in [facility].  Review of Systems: Constitutional: +fevers/chills Eyes: no vision changes Ears, nose, mouth, throat, and face: no cough Respiratory: no shortness of breath Cardiovascular: no chest pain Gastrointestinal: +nausea/vomiting, no abdominal pain, no constipation, no diarrhea Genitourinary: no dysuria, no hematuria Integument: per HPI Hematologic/lymphatic: no bleeding/bruising, no edema Musculoskeletal: +Rheumatoid Arthritis, no myalgias Neurological: no paresthesias, no weakness  Physical Exam: Blood pressure 162/46, pulse 71, temperature 98.3 F (36.8 C), temperature source Oral, resp. rate 18, height 5\' 3"   (1.6 m), weight 270 lb (122.471 kg), last menstrual period 03/22/2014, SpO2 99.00%. General Apperance: NAD Head: Normocephalic, atraumatic Eyes: PERRL, EOMI, anicteric sclera Ears: Nares normal, septum midline, mucosa normal Throat: Lips, mucosa and tongue normal  Neck: Supple, trachea midline Back: No tenderness or bony abnormality  Lungs: Clear to auscultation bilaterally. No wheezes, rhonchi or rales. Breathing comfortably Chest Wall: Nontender, no deformity Heart: Regular rate and rhythm, no murmur/rub/gallop Abdomen: Soft, nontender, nondistended, no rebound/guarding Extremities: Warm and well perfused, no edema. R wrist ROM limited by pain. Pulses: 2+ throughout Skin: R dorsal hand erythema 3cmx3cm with extension to R radial wrist. No joint effusion. 2cm x 1cm ulcer over dorsal hand. No drainage.  Neurologic: Alert and oriented x 3. CNII-XII intact. Normal strength and sensation  Lab results: Basic Metabolic Panel:  Recent Labs  81/27/51 2017  NA 133*  K 3.5*  CL 97  CO2 22  GLUCOSE 128*  BUN 7  CREATININE 0.70  CALCIUM 8.6   CBC:  Recent Labs  04/15/14 2017  WBC 6.4  NEUTROABS 3.8  HGB 12.4  HCT 35.9*  MCV 94.0  PLT 288  Relative neutrophils 59 Absolute neutrophils 3.8 No bands  CBG:  Recent Labs  04/15/14 2050  GLUCAP 132*   Misc. Labs: Lactic acid 2.37  Imaging results:  Dg Hand Complete Right  04/15/2014   CLINICAL DATA:  Right hand abscess around the fifth MCP. Redness and swelling down the wrist.  EXAM: RIGHT HAND - COMPLETE 3+ VIEW  COMPARISON:  10/13/2012  FINDINGS: Soft tissue swelling with soft tissue gas demonstrated superficial to the dorsal aspect of the fifth metacarpal bone. This is consistent with clinical history of abscess. No underlying bone changes to suggest osteomyelitis. No radiopaque soft tissue foreign bodies. No evidence of acute fracture or dislocation.  IMPRESSION: Soft tissue swelling and focal soft tissue gas over the  fifth metacarpal region. No acute bony abnormalities.   Electronically Signed   By: Burman Nieves M.D.   On: 04/15/2014 21:23    Assessment & Plan by Problem: Active Problems:   Cellulitis of hand  R hand abscess and erythema: likely skin abscess with spontaneous drainage and surrounding cellulitis. May require further incision and drainage, although no fluctuance appreciated. Pt febrile at home. Afebrile here and she does not meet SIRS criteria. Since she has extensive skin involvement will treat initially with parenteral antimicrobial therapy including coverage for MRSA given her history of MRSA abscess. She was given 1g IV vancomycin in the ED -pain control with Percocet 1-2 tablets Q4hr prn, and Toradol 15mg  Q6hr prn -consider hand surgery consult -continue IV vancomycin   Polydipsia, polyuria: She was diagnosed with DM2 but is not on medication.  -EKG -Hgb A1c -ASA 81mg  daily  HTN: not on medication at home -continue to monitor  FEN -heart healthy diet  DVT ppx: subq heparin 5000u TID  Dispo: Disposition is deferred at this  time, awaiting improvement of current medical problems. Anticipated discharge in approximately 1 day(s).   The patient does not have a current PCP (No Pcp Per Patient) and does not need an [facility] hospital follow-up appointment after discharge.  The patient does not know have transportation limitations that hinder transportation to clinic appointments.  Signed: Griffin Basil, MD 04/16/2014, 12:01 AM

## 2014-04-16 NOTE — Transfer of Care (Signed)
Immediate Anesthesia Transfer of Care Note  Patient: Judy Robles  Procedure(s) Performed: Procedure(s): IRRIGATION AND DEBRIDEMENT EXTREMITY (Right)  Patient Location: PACU  Anesthesia Type:General  Level of Consciousness: awake  Airway & Oxygen Therapy: Patient Spontanous Breathing and Patient connected to face mask oxygen  Post-op Assessment: Report given to PACU RN and Post -op Vital signs reviewed and stable  Post vital signs: Reviewed and stable  Complications: No apparent anesthesia complications

## 2014-04-16 NOTE — Consult Note (Signed)
Full consult note to follow Photos reviewed  Pt with abscess over hand Will proceed to OR tonight for incision and drainage of abscess over dorsum of hand and finger/forearm

## 2014-04-16 NOTE — Progress Notes (Signed)
Subjective:    Currently, the patient reports trouble sleeping last night due to pain and discomfort in her right hand.  It began to spontaneously drain pus last night again.  The pain is focused on the lateral aspect of her right wrist currently.  She reports multiple other pustules that have resolved over the last few months.  One is located on the same arm and the other is on her stomach. She denies fevers or chills since being hospitalized.  She is not currently having SOB or chest tightness.  Interval Events: -Spontaneous draining from abscess over right 4th metacarpal, sent for culture. -Started on IV vancomycin. -Afebrile overnight, BP elevated possibly due to pain. -Given morphine 6 mg and 2 mg IV for pain and Percocet 5-325 mg three times without adequate pain control.   Objective:    Vital Signs:   Temp:  [98.1 F (36.7 C)-99.6 F (37.6 C)] 98.1 F (36.7 C) (08/26 0446) Pulse Rate:  [66-91] 66 (08/26 0446) Resp:  [18] 18 (08/26 0446) BP: (153-183)/(46-114) 158/80 mmHg (08/26 0511) SpO2:  [95 %-99 %] 95 % (08/26 0446) Weight:  [270 lb (122.471 kg)] 270 lb (122.471 kg) (08/25 1734) Last BM Date: 04/15/14  24-hour weight change: Weight change:   Intake/Output:   Intake/Output Summary (Last 24 hours) at 04/16/14 1155 Last data filed at 04/16/14 3716  Gross per 24 hour  Intake    780 ml  Output      0 ml  Net    780 ml      Physical Exam: General: Vital signs reviewed and noted. Well-developed, well-nourished, in no acute distress; alert, appropriate and cooperative throughout examination.  Lungs:  Rhonchi in upper lung lobes. Normal respiratory effort.  Heart: RRR. S1 and S2 normal without gallop, murmur, or rubs.  Abdomen:  BS normoactive. Soft, Nondistended, non-tender.  No masses or organomegaly.  Extremities: Erythema and swelling over lateral right wrist and fourth metacarpal with scab.  Fluctuance noted over wrist and under ulcer. Old healing scabs from  draining pustules on upper right arm and stomach below umbilicus with no current infection. No pretibial edema.   See photos uploaded to Epic.  Labs:  Basic Metabolic Panel:  Recent Labs Lab 04/15/14 2017 04/16/14 0605  NA 133* 135*  K 3.5* 3.8  CL 97 100  CO2 22 23  GLUCOSE 128* 105*  BUN 7 8  CREATININE 0.70 0.75  CALCIUM 8.6 8.7    Liver Function Tests:  Recent Labs Lab 04/16/14 0605  AST 42*  ALT 30  ALKPHOS 59  BILITOT <0.2*  PROT 7.0  ALBUMIN 2.4*   CBC:  Recent Labs Lab 04/15/14 2017  WBC 6.4  NEUTROABS 3.8  HGB 12.4  HCT 35.9*  MCV 94.0  PLT 288   CBG:  Recent Labs Lab 04/15/14 2050  GLUCAP 132*    Microbiology: Results for orders placed during the hospital encounter of 04/15/14  MRSA PCR SCREENING     Status: None   Collection Time    04/16/14 12:54 AM      Result Value Ref Range Status   MRSA by PCR NEGATIVE  NEGATIVE Final   Comment:            The GeneXpert MRSA Assay (FDA     approved for NASAL specimens     only), is one component of a     comprehensive MRSA colonization     surveillance program. It is not     intended to  diagnose MRSA     infection nor to guide or     monitor treatment for     MRSA infections.    Coagulation Studies: No results found for this basename: LABPROT, INR,  in the last 72 hours   Other results: EKG: normal EKG, normal sinus rhythm.  Imaging: Dg Hand Complete Right  04/15/2014   CLINICAL DATA:  Right hand abscess around the fifth MCP. Redness and swelling down the wrist.  EXAM: RIGHT HAND - COMPLETE 3+ VIEW  COMPARISON:  10/13/2012  FINDINGS: Soft tissue swelling with soft tissue gas demonstrated superficial to the dorsal aspect of the fifth metacarpal bone. This is consistent with clinical history of abscess. No underlying bone changes to suggest osteomyelitis. No radiopaque soft tissue foreign bodies. No evidence of acute fracture or dislocation.  IMPRESSION: Soft tissue swelling and focal soft  tissue gas over the fifth metacarpal region. No acute bony abnormalities.   Electronically Signed   By: Burman Nieves M.D.   On: 04/15/2014 21:23     Medications:    Infusions:    Scheduled Medications: . aspirin EC  81 mg Oral Daily  . heparin  5,000 Units Subcutaneous 3 times per day  . nicotine  21 mg Transdermal Daily  . [START ON 04/17/2014] pneumococcal 23 valent vaccine  0.5 mL Intramuscular Tomorrow-1000  . vancomycin  1,500 mg Intravenous Q12H    PRN Medications: acetaminophen, acetaminophen, ketorolac, morphine injection, ondansetron (ZOFRAN) IV, ondansetron   Assessment/ Plan:    Active Problems:   Cellulitis of hand   Cellulitis of right hand  #R hand and wrist cellulitis Erythema stable since yesterday based on markings and patient has been afebrile, so the coverage is likely adequate with IV vanc.  There is concern for fluctuance both on the dorsal aspect of the fourth metacarpal and the lateral right wrist.  This will likely require drainage and warrants further imaging to determine if there is indeed fluid collection.  Consulted hand surgery who will see her later today and recommended NPO for possible procedure.  No evidence of bone involvement currently. She does have a history of MRSA abscess in the past, so will maintain coverage.  She is likely colonized and may benefit from decontamination in the future.  She says that she recently changed living situations to a cleaner location, which may help. -Consulted hand surgery (Dr. Melvyn Novas), appreciate recs. -NPO for possible procedure today. -CT R wrist with contrast today. -Continue IV vancomycin. -Morphine 4 mg q4h IV PRN pain.  #DM2 Diagnosed with DM2 in the past, but not currently on medications.  CBG 100-130 so far. -Follow up A1c drawn this morning. -CBG daily. -Holding ASA today for possible procedure.  #HTN Does not take medications at home. BP elevated to 150-180s today, likely driven by  pain. -Continue to monitor, consider starting an ACE inhibitor if remains elevated   DVT PPX - heparin  CODE STATUS - Full code.  CONSULTS PLACED - Hand surgery.  DISPO - Disposition is deferred at this time, awaiting improvement of cellulitis.   Anticipated discharge in approximately 2-3 day(s).   The patient does not have a current PCP (No PCP Per Patient) and does need an [facility] hospital follow-up appointment after discharge.    Is the [facility] hospital follow-up appointment a one-time only appointment? yes.  Does the patient have transportation limitations that hinder transportation to clinic appointments? unknown   SERVICE NEEDED AT DISCHARGE - TO BE DETERMINED DURING HOSPITAL COURSE  Y = Yes, Blank = No PT:   OT:   RN:   Equipment:   Other:      Length of Stay: 1 day(s)   Signed: Luisa Dago, MD  PGY-1, Internal Medicine Resident Pager: [phone] (7AM-5PM) 04/16/2014, 11:55 AM

## 2014-04-16 NOTE — Anesthesia Preprocedure Evaluation (Addendum)
Anesthesia Evaluation  Patient identified by MRN, date of birth, ID band Patient awake    Reviewed: Allergy & Precautions, H&P , NPO status , Patient's Chart, lab work & pertinent test results  History of Anesthesia Complications (+) MALIGNANT HYPERTHERMIA and Family history of anesthesia reaction  Airway Mallampati: II TM Distance: >3 FB Neck ROM: Full    Dental  (+) Poor Dentition, Dental Advisory Given   Pulmonary Current Smoker,          Cardiovascular hypertension,     Neuro/Psych    GI/Hepatic   Endo/Other    Renal/GU      Musculoskeletal   Abdominal   Peds  Hematology   Anesthesia Other Findings   Reproductive/Obstetrics                          Anesthesia Physical Anesthesia Plan  ASA: III  Anesthesia Plan: General   Post-op Pain Management:    Induction: Intravenous  Airway Management Planned: LMA  Additional Equipment:   Intra-op Plan:   Post-operative Plan: Extubation in OR  Informed Consent: I have reviewed the patients History and Physical, chart, labs and discussed the procedure including the risks, benefits and alternatives for the proposed anesthesia with the patient or authorized representative who has indicated his/her understanding and acceptance.   Dental advisory given  Plan Discussed with: CRNA, Anesthesiologist and Surgeon  Anesthesia Plan Comments:         Anesthesia Quick Evaluation

## 2014-04-16 NOTE — Progress Notes (Signed)
Patient for OR, report given to short stay.

## 2014-04-17 DIAGNOSIS — I10 Essential (primary) hypertension: Secondary | ICD-10-CM | POA: Diagnosis present

## 2014-04-17 LAB — CBC WITH DIFFERENTIAL/PLATELET
BASOS ABS: 0 10*3/uL (ref 0.0–0.1)
Basophils Relative: 1 % (ref 0–1)
EOS ABS: 0.1 10*3/uL (ref 0.0–0.7)
EOS PCT: 2 % (ref 0–5)
HCT: 29.9 % — ABNORMAL LOW (ref 36.0–46.0)
Hemoglobin: 9.7 g/dL — ABNORMAL LOW (ref 12.0–15.0)
LYMPHS PCT: 40 % (ref 12–46)
Lymphs Abs: 2.3 10*3/uL (ref 0.7–4.0)
MCH: 30.9 pg (ref 26.0–34.0)
MCHC: 32.4 g/dL (ref 30.0–36.0)
MCV: 95.2 fL (ref 78.0–100.0)
Monocytes Absolute: 0.4 10*3/uL (ref 0.1–1.0)
Monocytes Relative: 6 % (ref 3–12)
NEUTROS PCT: 52 % (ref 43–77)
Neutro Abs: 3 10*3/uL (ref 1.7–7.7)
PLATELETS: 234 10*3/uL (ref 150–400)
RBC: 3.14 MIL/uL — ABNORMAL LOW (ref 3.87–5.11)
RDW: 13.9 % (ref 11.5–15.5)
WBC: 5.8 10*3/uL (ref 4.0–10.5)

## 2014-04-17 LAB — GLUCOSE, CAPILLARY: Glucose-Capillary: 128 mg/dL — ABNORMAL HIGH (ref 70–99)

## 2014-04-17 MED ORDER — LISINOPRIL 10 MG PO TABS
10.0000 mg | ORAL_TABLET | Freq: Every day | ORAL | Status: DC
  Administered 2014-04-17 – 2014-04-18 (×2): 10 mg via ORAL
  Filled 2014-04-17 (×2): qty 1

## 2014-04-17 MED ORDER — HYDROMORPHONE HCL PF 1 MG/ML IJ SOLN
1.0000 mg | INTRAMUSCULAR | Status: DC | PRN
  Administered 2014-04-17: 2 mg via INTRAVENOUS
  Administered 2014-04-17 – 2014-04-18 (×3): 1 mg via INTRAVENOUS
  Filled 2014-04-17: qty 1
  Filled 2014-04-17: qty 2
  Filled 2014-04-17 (×2): qty 1

## 2014-04-17 MED ORDER — HYDROMORPHONE HCL PF 1 MG/ML IJ SOLN
0.5000 mg | INTRAMUSCULAR | Status: DC | PRN
  Administered 2014-04-17: 0.5 mg via INTRAVENOUS
  Filled 2014-04-17: qty 1

## 2014-04-17 MED ORDER — OXYCODONE-ACETAMINOPHEN 5-325 MG PO TABS
1.0000 | ORAL_TABLET | Freq: Four times a day (QID) | ORAL | Status: DC
  Administered 2014-04-17 – 2014-04-18 (×4): 1 via ORAL
  Filled 2014-04-17 (×4): qty 1

## 2014-04-17 NOTE — Clinical Social Work Note (Signed)
CSW received consult for [employer].  CSW gave patient a list of resources for free meals and [employer].  Patient reported that she has food stamps in [facility] but that there transfer to [employer] has been delayed.  CSW encouraged patient to visit [place] DSS in order to expedite the process.  Patient stated that she moved to [facility] recently and is now living with her ex who is also the father of her 26 year old son who she stated has SSI.  Patient also requested a list of housing in the [place] area.  CSW offered list of shelters but patient wanted low-income housing options.   Patient stated she had no further questions and CSW encouraged her to inform the nurses if she was in need of futher assistance.  CSW signing off for now.  Merlyn Lot, LCSWA Clinical Social Worker [phone]

## 2014-04-17 NOTE — Progress Notes (Signed)
PT SEEN/EXAMINED PT EXPLAINED PROCEDURE, MOTHER PRESENT CONTINUE WITH CURRENT SPLINT AND IV ABX DO NOT CHANGE SPLINT KEEP HAND ELEVATED CONTINUE IV ABX UNTIL CULTURES BACK OK TO GO OUT ON PO ABX ONCE CULTURES BACK WILL LOOK AT WOUND NEXT WEEK  IF QUESTIONS PLEASE CALL [phone]

## 2014-04-17 NOTE — Progress Notes (Signed)
Seen patient on morning rounds. Discussed plan with IM team. Would continue IV antibitoics as per hand surgery recommendations. Please see rest of the details of management per Dr Francella Solian note.

## 2014-04-17 NOTE — Progress Notes (Signed)
Subjective:    Ms. Judy Robles had some pain and discomfort post-operatively, but she is doing better this morning.  She reports being tired this morning along with some diaphoresis.  She denies SOB, chest tightness, nausea, vomiting, fevers, or chills.  Interval Events: -She was taken to the OR by Dr. Melvyn Robles of Orthopedics yesterday for I and D.  Wound cultures were sent. -Dr. Melvyn Robles recommends IV antibiotics until the wound culture results come back. -1/2 blood cultures from admission grew gram positive cocci in clusters. -Afebrile, blood pressure elevated yesterday to 190s, 156/62 this morning.   Objective:    Vital Signs:   Temp:  [98.1 F (36.7 C)-98.6 F (37 C)] 98.6 F (37 C) (08/27 0527) Pulse Rate:  [52-73] 72 (08/27 0614) Resp:  [13-21] 19 (08/27 0527) BP: (154-193)/(60-98) 156/62 mmHg (08/27 0614) SpO2:  [97 %-99 %] 99 % (08/27 0527) Last BM Date: 04/15/14  24-hour weight change: Weight change:   Intake/Output:   Intake/Output Summary (Last 24 hours) at 04/17/14 0912 Last data filed at 04/17/14 0800  Gross per 24 hour  Intake   2460 ml  Output   1250 ml  Net   1210 ml      Physical Exam: General: Vital signs reviewed and noted. Well-developed, well-nourished, in no acute distress; alert, appropriate and cooperative throughout examination.  Lungs:  Clear to auscultation bilaterally, no wheezing, rales or rhonchi.  Heart: RRR. S1 and S2 normal without gallop, murmur, or rubs.  Abdomen:  BS normoactive. Soft, Nondistended, non-tender.  No masses or organomegaly.  Extremities: R wrist wrapped, clean and dry.  Old scabs from draining pustules on upper right arm and stomach below umbilicus with no current infection. No pretibial edema.   See photos uploaded to Epic of wound prior to I and D.  Labs:  Basic Metabolic Panel:  Recent Labs Lab 04/15/14 2017 04/16/14 0605  NA 133* 135*  K 3.5* 3.8  CL 97 100  CO2 22 23  GLUCOSE 128* 105*  BUN 7 8  CREATININE  0.70 0.75  CALCIUM 8.6 8.7    Liver Function Tests:  Recent Labs Lab 04/16/14 0605  AST 42*  ALT 30  ALKPHOS 59  BILITOT <0.2*  PROT 7.0  ALBUMIN 2.4*   CBC:  Recent Labs Lab 04/15/14 2017 04/17/14 0519  WBC 6.4 5.8  NEUTROABS 3.8 3.0  HGB 12.4 9.7*  HCT 35.9* 29.9*  MCV 94.0 95.2  PLT 288 234   CBG:  Recent Labs Lab 04/15/14 2050 04/17/14 0601  GLUCAP 132* 128*    Microbiology: Results for orders placed during the hospital encounter of 04/15/14  CULTURE, BLOOD (ROUTINE X 2)     Status: None   Collection Time    04/15/14  4:39 PM      Result Value Ref Range Status   Specimen Description BLOOD ARM LEFT   Final   Special Requests BOTTLES DRAWN AEROBIC AND ANAEROBIC 5CC   Final   Culture  Setup Time     Final   Value: 04/16/2014 01:26     Performed at [facility]   Culture     Final   Value: GRAM POSITIVE COCCI IN CLUSTERS     Note: Gram Stain Report Called to,Read Back By and Verified With: Judy Robles ON 04/16/2014 AT 8:56P BY Judy Robles     Performed at [facility]   Report Status PENDING   Incomplete  CULTURE, BLOOD (ROUTINE X 2)     Status: None  Collection Time    04/15/14  4:43 PM      Result Value Ref Range Status   Specimen Description BLOOD WRIST LEFT   Final   Special Requests BOTTLES DRAWN AEROBIC AND ANAEROBIC 5CC   Final   Culture  Setup Time     Final   Value: 04/16/2014 01:26     Performed at [facility]   Culture     Final   Value:        BLOOD CULTURE RECEIVED NO GROWTH TO DATE CULTURE WILL BE HELD FOR 5 DAYS BEFORE ISSUING A FINAL NEGATIVE REPORT     Performed at [facility]   Report Status PENDING   Incomplete  MRSA PCR SCREENING     Status: None   Collection Time    04/16/14 12:54 AM      Result Value Ref Range Status   MRSA by PCR NEGATIVE  NEGATIVE Final   Comment:            The GeneXpert MRSA Assay (FDA     approved for NASAL specimens     only), is one component of a     comprehensive  MRSA colonization     surveillance program. It is not     intended to diagnose MRSA     infection nor to guide or     monitor treatment for     MRSA infections.  WOUND CULTURE     Status: None   Collection Time    04/16/14  4:02 AM      Result Value Ref Range Status   Specimen Description HAND   Final   Special Requests Normal   Final   Gram Stain     Final   Value: RARE WBC PRESENT,BOTH PMN AND MONONUCLEAR     NO SQUAMOUS EPITHELIAL CELLS SEEN     NO ORGANISMS SEEN     Performed at [facility]   Culture     Final   Value: NO GROWTH 1 DAY     Performed at [facility]   Report Status PENDING   Incomplete  CULTURE, ROUTINE-ABSCESS     Status: None   Collection Time    04/16/14  6:52 PM      Result Value Ref Range Status   Specimen Description ABSCESS HAND RIGHT   Final   Special Requests PATIENT ON FOLLOWING VANCOMYCIN   Final   Gram Stain PENDING   Incomplete   Culture     Final   Value: NO GROWTH     Performed at [facility]   Report Status PENDING   Incomplete  TISSUE CULTURE     Status: None   Collection Time    04/16/14  6:56 PM      Result Value Ref Range Status   Specimen Description TISSUE HAND RIGHT   Final   Special Requests PATIENT ON FOLLOWING VANCOMYCIN   Final   Gram Stain PENDING   Incomplete   Culture     Final   Value: NO GROWTH     Performed at [facility]   Report Status PENDING   Incomplete    Coagulation Studies: No results found for this basename: LABPROT, INR,  in the last 72 hours  Hgb A1c: 5.6.  Imaging: Dg Hand Complete Right  04/15/2014   CLINICAL DATA:  Right hand abscess around the fifth MCP. Redness and swelling down the wrist.  EXAM: RIGHT HAND - COMPLETE 3+  VIEW  COMPARISON:  10/13/2012  FINDINGS: Soft tissue swelling with soft tissue gas demonstrated superficial to the dorsal aspect of the fifth metacarpal bone. This is consistent with clinical history of abscess. No underlying bone changes to  suggest osteomyelitis. No radiopaque soft tissue foreign bodies. No evidence of acute fracture or dislocation.  IMPRESSION: Soft tissue swelling and focal soft tissue gas over the fifth metacarpal region. No acute bony abnormalities.   Electronically Signed   By: Judy Robles M.D.   On: 04/15/2014 21:23     Medications:    Infusions: . lactated ringers 10 mL/hr at 04/16/14 1705    Scheduled Medications: . aspirin EC  81 mg Oral Daily  . heparin  5,000 Units Subcutaneous 3 times per day  . lisinopril  10 mg Oral Daily  . nicotine  21 mg Transdermal Daily  . pneumococcal 23 valent vaccine  0.5 mL Intramuscular Tomorrow-1000  . vancomycin  1,500 mg Intravenous Q12H    PRN Medications: acetaminophen, acetaminophen, HYDROmorphone (DILAUDID) injection, ketorolac, ondansetron (ZOFRAN) IV, ondansetron   Assessment/ Plan:    Principal Problem:   Cellulitis of right hand Active Problems:   Cellulitis of hand   Essential hypertension, benign  #R hand and wrist cellulitis Taken to OR for incision and drainage yesterday.  Dr. Melvyn Robles of ortho recommends continued IV vancomycin until culture results back.  Grew gram positive cocci in clusters in 1/2 blood cultures yesterday awaiting speciation.  Could be MRSA vs. Contaminant.  Patient has been afebrile since starting antibiotics so coverage appears to be good.  Pain control not good after operation, so will adjust today.  -Continue Vancomycin IV until culture results come back. -Follow up wound and blood cultures. -Carb-modified diet. -Stop IVF. -Switch from morphine 4 mg IV q4h PRN to Dilaudid 0.5 mg IV q2hPRN.  #DM2 Diagnosed with DM2 in the past. A1C 5.6, not currently on medications. -CBG daily. -Continue ASA 81 mg daily.  #HTN Does not take medications at home. BP elevated to 150-190s yesteday.  Possible pain component, but will start treatment today. -Start lisinopril 10 mg daily given history of diabetes.   DVT PPX -  heparin  CODE STATUS - Full code.  CONSULTS PLACED - Hand surgery.  DISPO - Disposition is deferred at this time, awaiting improvement of cellulitis.   Anticipated discharge in approximately 2-3 day(s).   The patient does not have a current PCP (No PCP Per Patient) and does need an [facility] hospital follow-up appointment after discharge.    Is the [facility] hospital follow-up appointment a one-time only appointment? yes.  Does the patient have transportation limitations that hinder transportation to clinic appointments? unknown   SERVICE NEEDED AT DISCHARGE - TO BE DETERMINED DURING HOSPITAL COURSE         Y = Yes, Blank = No PT:   OT:   RN:   Equipment:   Other:      Length of Stay: 2 day(s)   Signed: Luisa Dago, MD  PGY-1, Internal Medicine Resident Pager: [phone] (7AM-5PM) 04/17/2014, 9:12 AM

## 2014-04-18 ENCOUNTER — Encounter (HOSPITAL_COMMUNITY): Payer: Self-pay | Admitting: Orthopedic Surgery

## 2014-04-18 LAB — CULTURE, BLOOD (ROUTINE X 2)

## 2014-04-18 LAB — WOUND CULTURE
Culture: NO GROWTH
SPECIAL REQUESTS: NORMAL

## 2014-04-18 LAB — GLUCOSE, CAPILLARY: Glucose-Capillary: 88 mg/dL (ref 70–99)

## 2014-04-18 LAB — VANCOMYCIN, TROUGH: Vancomycin Tr: 19.9 ug/mL (ref 10.0–20.0)

## 2014-04-18 MED ORDER — SENNOSIDES-DOCUSATE SODIUM 8.6-50 MG PO TABS
1.0000 | ORAL_TABLET | Freq: Two times a day (BID) | ORAL | Status: DC | PRN
  Administered 2014-04-19: 1 via ORAL
  Filled 2014-04-18: qty 1

## 2014-04-18 MED ORDER — OXYCODONE HCL 5 MG PO TABS
10.0000 mg | ORAL_TABLET | ORAL | Status: DC | PRN
  Administered 2014-04-18 – 2014-04-19 (×4): 10 mg via ORAL
  Filled 2014-04-18 (×4): qty 2

## 2014-04-18 MED ORDER — ACETAMINOPHEN 325 MG PO TABS
650.0000 mg | ORAL_TABLET | Freq: Four times a day (QID) | ORAL | Status: DC | PRN
  Administered 2014-04-18 – 2014-04-19 (×2): 650 mg via ORAL
  Filled 2014-04-18 (×2): qty 2

## 2014-04-18 MED ORDER — LISINOPRIL 10 MG PO TABS
10.0000 mg | ORAL_TABLET | Freq: Once | ORAL | Status: AC
  Administered 2014-04-18: 10 mg via ORAL
  Filled 2014-04-18: qty 1

## 2014-04-18 MED ORDER — LISINOPRIL 20 MG PO TABS
20.0000 mg | ORAL_TABLET | Freq: Every day | ORAL | Status: DC
  Administered 2014-04-19: 20 mg via ORAL
  Filled 2014-04-18: qty 1

## 2014-04-18 MED ORDER — VANCOMYCIN HCL 10 G IV SOLR
1250.0000 mg | Freq: Two times a day (BID) | INTRAVENOUS | Status: DC
  Filled 2014-04-18: qty 1250

## 2014-04-18 MED ORDER — HYDROMORPHONE HCL PF 1 MG/ML IJ SOLN
1.0000 mg | INTRAMUSCULAR | Status: DC | PRN
  Administered 2014-04-18: 2 mg via INTRAVENOUS
  Administered 2014-04-18: 1 mg via INTRAVENOUS
  Filled 2014-04-18: qty 2
  Filled 2014-04-18: qty 1

## 2014-04-18 NOTE — Care Management Note (Signed)
  Page 1 of 1   04/18/2014     3:42:22 PM CARE MANAGEMENT NOTE 04/18/2014  Patient:  Judy Robles, Judy Robles   Account Number:  [id number]  Date Initiated:  04/18/2014  Documentation initiated by:  Ronny Flurry  Subjective/Objective Assessment:     Action/Plan:   Anticipated DC Date:     Anticipated DC Plan:  HOME/SELF CARE  In-house referral  Financial Counselor      DC Planning Services  CM consult  [facility]  [facility]      Choice offered to / List presented to:             Status of service:   Medicare Important Message given?   (If response is "NO", the following Medicare IM given date fields will be blank) Date Medicare IM given:   Medicare IM given by:   Date Additional Medicare IM given:   Additional Medicare IM given by:    Discharge Disposition:    Per UR Regulation:    If discussed at Long Length of Stay Meetings, dates discussed:    Comments:  04-18-14 [facility] letter given and explained to patient . [facility] [facility] information given also . Patient voiced understanding on both and will call [facility] to schedule appointment.  Ronny Flurry RN BSN [phone]

## 2014-04-18 NOTE — Progress Notes (Signed)
ANTIBIOTIC CONSULT NOTE - Follow-up  Pharmacy Consult for vancomycin Indication: Cellulitis  No Known Allergies  Patient Measurements: Height: 5\' 3"  (160 cm) Weight: 270 lb (122.471 kg) IBW/kg (Calculated) : 52.4   Vital Signs: Temp: 97.9 F (36.6 C) (08/28 1300) Temp src: Oral (08/28 1300) BP: 140/73 mmHg (08/28 1300) Pulse Rate: 63 (08/28 1300) Intake/Output from previous day: 08/27 0701 - 08/28 0700 In: 1902.7 [P.O.:1080; I.V.:822.7] Out: 1450 [Urine:1450] Intake/Output from this shift:    Labs:  Recent Labs  04/15/14 2017 04/16/14 0605 04/17/14 0519  WBC 6.4  --  5.8  HGB 12.4  --  9.7*  PLT 288  --  234  CREATININE 0.70 0.75  --    Estimated Creatinine Clearance: 118.6 ml/min (by C-G formula based on Cr of 0.75).  Recent Labs  04/18/14 1740  VANCOTROUGH 19.9     Microbiology: Recent Results (from the past 720 hour(s))  CULTURE, BLOOD (ROUTINE X 2)     Status: None   Collection Time    04/15/14  4:39 PM      Result Value Ref Range Status   Specimen Description BLOOD ARM LEFT   Final   Special Requests BOTTLES DRAWN AEROBIC AND ANAEROBIC 5CC   Final   Culture  Setup Time     Final   Value: 04/16/2014 01:26     Performed at 04/18/2014   Culture     Final   Value: STAPHYLOCOCCUS SPECIES (COAGULASE NEGATIVE)     Note: THE SIGNIFICANCE OF ISOLATING THIS ORGANISM FROM A SINGLE SET OF BLOOD CULTURES WHEN MULTIPLE SETS ARE DRAWN IS UNCERTAIN. PLEASE NOTIFY THE MICROBIOLOGY DEPARTMENT WITHIN ONE WEEK IF SPECIATION AND SENSITIVITIES ARE REQUIRED.     Note: Gram Stain Report Called to,Read Back By and Verified With: LOURDES ABIERA ON 04/16/2014 AT 8:56P BY WILEJ     Performed at 04/18/2014   Report Status 04/18/2014 FINAL   Final  CULTURE, BLOOD (ROUTINE X 2)     Status: None   Collection Time    04/15/14  4:43 PM      Result Value Ref Range Status   Specimen Description BLOOD WRIST LEFT   Final   Special Requests BOTTLES DRAWN AEROBIC  AND ANAEROBIC 5CC   Final   Culture  Setup Time     Final   Value: 04/16/2014 01:26     Performed at 04/18/2014   Culture     Final   Value:        BLOOD CULTURE RECEIVED NO GROWTH TO DATE CULTURE WILL BE HELD FOR 5 DAYS BEFORE ISSUING A FINAL NEGATIVE REPORT     Performed at [facility]   Report Status PENDING   Incomplete  MRSA PCR SCREENING     Status: None   Collection Time    04/16/14 12:54 AM      Result Value Ref Range Status   MRSA by PCR NEGATIVE  NEGATIVE Final   Comment:            The GeneXpert MRSA Assay (FDA     approved for NASAL specimens     only), is one component of a     comprehensive MRSA colonization     surveillance program. It is not     intended to diagnose MRSA     infection nor to guide or     monitor treatment for     MRSA infections.  WOUND CULTURE     Status:  None   Collection Time    04/16/14  4:02 AM      Result Value Ref Range Status   Specimen Description HAND   Final   Special Requests Normal   Final   Gram Stain     Final   Value: RARE WBC PRESENT,BOTH PMN AND MONONUCLEAR     NO SQUAMOUS EPITHELIAL CELLS SEEN     NO ORGANISMS SEEN     Performed at [facility]   Culture     Final   Value: NO GROWTH 2 DAYS     Performed at [facility]   Report Status 04/18/2014 FINAL   Final  CULTURE, ROUTINE-ABSCESS     Status: None   Collection Time    04/16/14  6:52 PM      Result Value Ref Range Status   Specimen Description ABSCESS HAND RIGHT   Final   Special Requests PATIENT ON FOLLOWING VANCOMYCIN   Final   Gram Stain     Final   Value: RARE WBC PRESENT, PREDOMINANTLY MONONUCLEAR     NO SQUAMOUS EPITHELIAL CELLS SEEN     NO ORGANISMS SEEN     Performed at [facility]   Culture     Final   Value: NO GROWTH 1 DAY     Performed at [facility]   Report Status PENDING   Incomplete  ANAEROBIC CULTURE     Status: None   Collection Time    04/16/14  6:52 PM      Result Value Ref Range  Status   Specimen Description ABSCESS HAND RIGHT   Final   Special Requests PATIENT ON FOLLOWING VANCOMYCIN   Final   Gram Stain     Final   Value: RARE WBC PRESENT, PREDOMINANTLY MONONUCLEAR     NO SQUAMOUS EPITHELIAL CELLS SEEN     NO ORGANISMS SEEN     Performed at [facility]   Culture     Final   Value: NO ANAEROBES ISOLATED; CULTURE IN PROGRESS FOR 5 DAYS     Performed at [facility]   Report Status PENDING   Incomplete  TISSUE CULTURE     Status: None   Collection Time    04/16/14  6:56 PM      Result Value Ref Range Status   Specimen Description TISSUE HAND RIGHT   Final   Special Requests PATIENT ON FOLLOWING VANCOMYCIN   Final   Gram Stain     Final   Value: RARE WBC PRESENT,BOTH PMN AND MONONUCLEAR     NO SQUAMOUS EPITHELIAL CELLS SEEN     NO ORGANISMS SEEN     Performed at [facility]   Culture     Final   Value: NO GROWTH 1 DAY     Performed at [facility]   Report Status PENDING   Incomplete  ANAEROBIC CULTURE     Status: None   Collection Time    04/16/14  6:56 PM      Result Value Ref Range Status   Specimen Description TISSUE HAND RIGHT   Final   Special Requests PATIENT ON FOLLOWING VANCOMYCIN   Final   Gram Stain     Final   Value: RARE WBC PRESENT,BOTH PMN AND MONONUCLEAR     NO SQUAMOUS EPITHELIAL CELLS SEEN     NO ORGANISMS SEEN     Performed at [facility]     Final  Value: NO ANAEROBES ISOLATED; CULTURE IN PROGRESS FOR 5 DAYS     Performed at [facility]   Report Status PENDING   Incomplete   Assessment: 38 YOF on Vancomycin  for R hand abscess. Noted pt with h/o MRSA abscess. Vanc trough came back at 20 tonight. Will lower the dose to get the level lower.   vanc 8/25>>  8/26 Wound>> 8/25 Bld x2>>1/2 CNS  Goal of Therapy:  Vancomycin trough level 10-15 mcg/ml  Plan:   Change vanc to 1.25g IV q12 Level prn

## 2014-04-18 NOTE — Progress Notes (Signed)
Subjective:    Judy Robles reports continuing to have significant pain in her right hand. She describes some itching and burning along with sharp pain. She thinks she needs more pain medication.  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  She is not currently using them, but she has been prescribed up to 20 mg oxycodone four times per day and Opana 30 mg BID.  She has some diaphoresis at night, but she denies fevers, chills, chest pain, shortness of breath or headaches.  She is continuing to have normal bowel movements.  Interval Events: -1/2 blood cultures coag negative staph, likely contaminant. -Afebrile, BP slightly improved since starting lisinopril. -Awaiting wound culture results.   Objective:    Vital Signs:   Temp:  [97.8 F (36.6 C)-99.2 F (37.3 C)] 97.8 F (36.6 C) (08/28 0519) Pulse Rate:  [63-87] 63 (08/28 0519) Resp:  [16-19] 18 (08/28 0519) BP: (149-173)/(62-72) 164/72 mmHg (08/28 0519) SpO2:  [96 %-98 %] 96 % (08/28 0519) Last BM Date: 04/17/14  24-hour weight change: Weight change:   Intake/Output:   Intake/Output Summary (Last 24 hours) at 04/18/14 1129 Last data filed at 04/18/14 0509  Gross per 24 hour  Intake 1362.67 ml  Output   1450 ml  Net -87.33 ml      Physical Exam: General: Vital signs reviewed and noted. Well-developed, well-nourished, in no acute distress; alert, appropriate and cooperative throughout examination.  Lungs:  Clear to auscultation bilaterally, no wheezing, rales or rhonchi.  Heart: RRR. S1 and S2 normal without gallop, murmur, or rubs.  Abdomen:  BS normoactive. Soft, Nondistended, non-tender.  No masses or organomegaly.  Extremities: R wrist wrapped, clean and dry.  Old scabs from draining pustules on upper right arm and stomach below umbilicus with no current infection. No pretibial edema.   Labs:  Basic Metabolic Panel:  Recent Labs Lab 04/15/14 2017 04/16/14 0605  NA 133*  135*  K 3.5* 3.8  CL 97 100  CO2 22 23  GLUCOSE 128* 105*  BUN 7 8  CREATININE 0.70 0.75  CALCIUM 8.6 8.7    Liver Function Tests:  Recent Labs Lab 04/16/14 0605  AST 42*  ALT 30  ALKPHOS 59  BILITOT <0.2*  PROT 7.0  ALBUMIN 2.4*   CBC:  Recent Labs Lab 04/15/14 2017 04/17/14 0519  WBC 6.4 5.8  NEUTROABS 3.8 3.0  HGB 12.4 9.7*  HCT 35.9* 29.9*  MCV 94.0 95.2  PLT 288 234   CBG:  Recent Labs Lab 04/15/14 2050 04/17/14 0601 04/18/14 0514  GLUCAP 132* 128* 88    Microbiology: Results for orders placed during the hospital encounter of 04/15/14  CULTURE, BLOOD (ROUTINE X 2)     Status: None   Collection Time    04/15/14  4:39 PM      Result Value Ref Range Status   Specimen Description BLOOD ARM LEFT   Final   Special Requests BOTTLES DRAWN AEROBIC AND ANAEROBIC 5CC   Final   Culture  Setup Time     Final   Value: 04/16/2014 01:26     Performed at [facility]   Culture     Final   Value: STAPHYLOCOCCUS SPECIES (COAGULASE NEGATIVE)     Note: THE SIGNIFICANCE OF ISOLATING THIS ORGANISM FROM A SINGLE SET OF BLOOD CULTURES WHEN MULTIPLE SETS ARE DRAWN IS UNCERTAIN. PLEASE NOTIFY THE MICROBIOLOGY DEPARTMENT WITHIN ONE WEEK IF SPECIATION AND SENSITIVITIES ARE REQUIRED.  Note: Gram Stain Report Called to,Read Back By and Verified With: LOURDES ABIERA ON 04/16/2014 AT 8:56P BY WILEJ     Performed at [facility]   Report Status 04/18/2014 FINAL   Final  CULTURE, BLOOD (ROUTINE X 2)     Status: None   Collection Time    04/15/14  4:43 PM      Result Value Ref Range Status   Specimen Description BLOOD WRIST LEFT   Final   Special Requests BOTTLES DRAWN AEROBIC AND ANAEROBIC 5CC   Final   Culture  Setup Time     Final   Value: 04/16/2014 01:26     Performed at [facility]   Culture     Final   Value:        BLOOD CULTURE RECEIVED NO GROWTH TO DATE CULTURE WILL BE HELD FOR 5 DAYS BEFORE ISSUING A FINAL NEGATIVE REPORT     Performed  at [facility]   Report Status PENDING   Incomplete  MRSA PCR SCREENING     Status: None   Collection Time    04/16/14 12:54 AM      Result Value Ref Range Status   MRSA by PCR NEGATIVE  NEGATIVE Final   Comment:            The GeneXpert MRSA Assay (FDA     approved for NASAL specimens     only), is one component of a     comprehensive MRSA colonization     surveillance program. It is not     intended to diagnose MRSA     infection nor to guide or     monitor treatment for     MRSA infections.  WOUND CULTURE     Status: None   Collection Time    04/16/14  4:02 AM      Result Value Ref Range Status   Specimen Description HAND   Final   Special Requests Normal   Final   Gram Stain     Final   Value: RARE WBC PRESENT,BOTH PMN AND MONONUCLEAR     NO SQUAMOUS EPITHELIAL CELLS SEEN     NO ORGANISMS SEEN     Performed at [facility]   Culture     Final   Value: NO GROWTH 2 DAYS     Performed at [facility]   Report Status 04/18/2014 FINAL   Final  CULTURE, ROUTINE-ABSCESS     Status: None   Collection Time    04/16/14  6:52 PM      Result Value Ref Range Status   Specimen Description ABSCESS HAND RIGHT   Final   Special Requests PATIENT ON FOLLOWING VANCOMYCIN   Final   Gram Stain     Final   Value: RARE WBC PRESENT, PREDOMINANTLY MONONUCLEAR     NO SQUAMOUS EPITHELIAL CELLS SEEN     NO ORGANISMS SEEN     Performed at [facility]   Culture     Final   Value: NO GROWTH 1 DAY     Performed at [facility]   Report Status PENDING   Incomplete  ANAEROBIC CULTURE     Status: None   Collection Time    04/16/14  6:52 PM      Result Value Ref Range Status   Specimen Description ABSCESS HAND RIGHT   Final   Special Requests PATIENT ON FOLLOWING VANCOMYCIN   Final   Gram Stain  Final   Value: RARE WBC PRESENT, PREDOMINANTLY MONONUCLEAR     NO SQUAMOUS EPITHELIAL CELLS SEEN     NO ORGANISMS SEEN     Performed at [facility]   Culture     Final   Value: NO ANAEROBES ISOLATED; CULTURE IN PROGRESS FOR 5 DAYS     Performed at [facility]   Report Status PENDING   Incomplete  TISSUE CULTURE     Status: None   Collection Time    04/16/14  6:56 PM      Result Value Ref Range Status   Specimen Description TISSUE HAND RIGHT   Final   Special Requests PATIENT ON FOLLOWING VANCOMYCIN   Final   Gram Stain     Final   Value: RARE WBC PRESENT,BOTH PMN AND MONONUCLEAR     NO SQUAMOUS EPITHELIAL CELLS SEEN     NO ORGANISMS SEEN     Performed at [facility]   Culture     Final   Value: NO GROWTH 1 DAY     Performed at [facility]   Report Status PENDING   Incomplete  ANAEROBIC CULTURE     Status: None   Collection Time    04/16/14  6:56 PM      Result Value Ref Range Status   Specimen Description TISSUE HAND RIGHT   Final   Special Requests PATIENT ON FOLLOWING VANCOMYCIN   Final   Gram Stain     Final   Value: RARE WBC PRESENT,BOTH PMN AND MONONUCLEAR     NO SQUAMOUS EPITHELIAL CELLS SEEN     NO ORGANISMS SEEN     Performed at [facility]   Culture     Final   Value: NO ANAEROBES ISOLATED; CULTURE IN PROGRESS FOR 5 DAYS     Performed at [facility]   Report Status PENDING   Incomplete    Coagulation Studies: No results found for this basename: LABPROT, INR,  in the last 72 hours  Hgb A1c: 5.6.  Imaging: No results found.   Medications:    Infusions: . lactated ringers 10 mL/hr at 04/16/14 1705    Scheduled Medications: . aspirin EC  81 mg Oral Daily  . heparin  5,000 Units Subcutaneous 3 times per day  . lisinopril  10 mg Oral Daily  . nicotine  21 mg Transdermal Daily  . vancomycin  1,500 mg Intravenous Q12H    PRN Medications: acetaminophen, HYDROmorphone (DILAUDID) injection, ketorolac, ondansetron (ZOFRAN) IV, ondansetron, oxyCODONE   Assessment/ Plan:    Principal Problem:   Cellulitis of right hand Active Problems:    Cellulitis of hand   Essential hypertension, benign  #R hand and wrist cellulitis Has been afebrile since being on vancomycin, but continues to have pain.  She requires higher doses of opioids due to previous use for RA.  Will have to be careful at discharge due to history of abuse.  Cannot assess improvement of erythema because ortho does not want wrap removed.  Dr. Melvyn Novas plans to look at the wound next week.  Coag neg staph on 1/2 blood cultures is contaminant. -Keep wrist wrapped until ortho-hand examines next week. -Keep wrist elevated per ortho recs. -Continue Vancomycin IV until culture results come back. -Follow up wound and blood cultures. -Carb-modified diet. -Stop IVF. -Increase from Percocet 5-325 q6h to oxycodone 10 mg q3h PRN with acetaminophen 625 mg q6h PRN. -Increase Dilaudid from 1-2 mg IV q4h PRN to q2h  PRN  #HTN Does not take medications at home. SBP down to 140-160s yesteday after starting lisinopril 10 mg daily.  Pain could be contributing. -Increase lisinopril to 20 mg daily.   DVT PPX - heparin  CODE STATUS - Full code.  CONSULTS PLACED - Hand surgery.  DISPO - Disposition is deferred at this time, awaiting improvement of cellulitis.   Anticipated discharge in approximately 2-3 day(s).   The patient does not have a current PCP (No PCP Per Patient) and does need an [facility] hospital follow-up appointment after discharge.    Is the [facility] hospital follow-up appointment a one-time only appointment? yes.  Does the patient have transportation limitations that hinder transportation to clinic appointments? unknown   SERVICE NEEDED AT DISCHARGE - TO BE DETERMINED DURING HOSPITAL COURSE         Y = Yes, Blank = No PT:   OT:   RN:   Equipment:   Other:      Length of Stay: 3 day(s)   Signed: Luisa Dago, MD  PGY-1, Internal Medicine Resident Pager: [phone] (7AM-5PM) 04/18/2014, 11:29 AM

## 2014-04-19 DIAGNOSIS — I10 Essential (primary) hypertension: Secondary | ICD-10-CM

## 2014-04-19 LAB — GLUCOSE, CAPILLARY: Glucose-Capillary: 85 mg/dL (ref 70–99)

## 2014-04-19 MED ORDER — LISINOPRIL 20 MG PO TABS: 20.0000 mg | ORAL_TABLET | Freq: Every day | ORAL | Status: DC

## 2014-04-19 MED ORDER — OXYCODONE HCL 10 MG PO TABS: 10.0000 mg | ORAL_TABLET | Freq: Four times a day (QID) | ORAL | Status: DC | PRN

## 2014-04-19 MED ORDER — SULFAMETHOXAZOLE-TMP DS 800-160 MG PO TABS
1.0000 | ORAL_TABLET | Freq: Two times a day (BID) | ORAL | Status: DC
Start: 2014-04-19 — End: 2014-04-19
  Administered 2014-04-19: 1 via ORAL
  Filled 2014-04-19: qty 1

## 2014-04-19 MED ORDER — SULFAMETHOXAZOLE-TMP DS 800-160 MG PO TABS
1.0000 | ORAL_TABLET | Freq: Two times a day (BID) | ORAL | Status: AC
Start: 2014-04-19 — End: 2014-04-25

## 2014-04-19 MED ORDER — SENNOSIDES-DOCUSATE SODIUM 8.6-50 MG PO TABS: 1.0000 | ORAL_TABLET | Freq: Every evening | ORAL | Status: DC | PRN

## 2014-04-19 NOTE — Discharge Planning (Addendum)
Copy of AVS to pt and RX for pain to pt who verbalizes understanding. Will dc to private car home with all personal belongings.  D'cd at 1030.

## 2014-04-19 NOTE — Progress Notes (Signed)
Subjective:    Judy Robles is doing well with better control of her pain this morning since her pain medications were increased.  She had a good night of sleep and is feeling more rested.  She denies fevers, chills, chest pain, SOB, or headaches.  Interval Events: -Dr. Melvyn Novas of Ortho hand okay with discharge today on PO antibiotics with follow up this week as outpatient. -VS stable, afebrile. -No new growth on wound or blood cultures.   Objective:    Vital Signs:   Temp:  [97.8 F (36.6 C)-97.9 F (36.6 C)] 97.9 F (36.6 C) (08/29 0254) Pulse Rate:  [59-63] 59 (08/29 0613) Resp:  [18] 18 (08/29 0613) BP: (132-148)/(62-102) 132/102 mmHg (08/29 0613) SpO2:  [98 %-100 %] 100 % (08/29 0613) Last BM Date: 04/17/14  24-hour weight change: Weight change:   Intake/Output:   Intake/Output Summary (Last 24 hours) at 04/19/14 0835 Last data filed at 04/18/14 1831  Gross per 24 hour  Intake    720 ml  Output      0 ml  Net    720 ml      Physical Exam: General: Vital signs reviewed and noted. Well-developed, well-nourished, in no acute distress; alert, appropriate and cooperative throughout examination.  Lungs:  Clear to auscultation bilaterally, no wheezing, rales or rhonchi.  Heart: RRR. S1 and S2 normal without gallop, murmur, or rubs.  Abdomen:  BS normoactive. Soft, Nondistended, non-tender.  No masses or organomegaly.  Extremities: R wrist wrapped, clean and dry.  Old scabs from draining pustules on upper right arm and stomach below umbilicus with no current infection. No pretibial edema.   Labs:  Basic Metabolic Panel:  Recent Labs Lab 04/15/14 2017 04/16/14 0605  NA 133* 135*  K 3.5* 3.8  CL 97 100  CO2 22 23  GLUCOSE 128* 105*  BUN 7 8  CREATININE 0.70 0.75  CALCIUM 8.6 8.7    Liver Function Tests:  Recent Labs Lab 04/16/14 0605  AST 42*  ALT 30  ALKPHOS 59  BILITOT <0.2*  PROT 7.0  ALBUMIN 2.4*   CBC:  Recent Labs Lab 04/15/14 2017  04/17/14 0519  WBC 6.4 5.8  NEUTROABS 3.8 3.0  HGB 12.4 9.7*  HCT 35.9* 29.9*  MCV 94.0 95.2  PLT 288 234   CBG:  Recent Labs Lab 04/15/14 2050 04/17/14 0601 04/18/14 0514 04/19/14 0608  GLUCAP 132* 128* 88 85    Microbiology: Results for orders placed during the hospital encounter of 04/15/14  CULTURE, BLOOD (ROUTINE X 2)     Status: None   Collection Time    04/15/14  4:39 PM      Result Value Ref Range Status   Specimen Description BLOOD ARM LEFT   Final   Special Requests BOTTLES DRAWN AEROBIC AND ANAEROBIC 5CC   Final   Culture  Setup Time     Final   Value: 04/16/2014 01:26     Performed at [facility]   Culture     Final   Value: STAPHYLOCOCCUS SPECIES (COAGULASE NEGATIVE)     Note: THE SIGNIFICANCE OF ISOLATING THIS ORGANISM FROM A SINGLE SET OF BLOOD CULTURES WHEN MULTIPLE SETS ARE DRAWN IS UNCERTAIN. PLEASE NOTIFY THE MICROBIOLOGY DEPARTMENT WITHIN ONE WEEK IF SPECIATION AND SENSITIVITIES ARE REQUIRED.     Note: Gram Stain Report Called to,Read Back By and Verified With: LOURDES ABIERA ON 04/16/2014 AT 8:56P BY Serafina Mitchell     Performed at [facility]   Report Status  04/18/2014 FINAL   Final  CULTURE, BLOOD (ROUTINE X 2)     Status: None   Collection Time    04/15/14  4:43 PM      Result Value Ref Range Status   Specimen Description BLOOD WRIST LEFT   Final   Special Requests BOTTLES DRAWN AEROBIC AND ANAEROBIC 5CC   Final   Culture  Setup Time     Final   Value: 04/16/2014 01:26     Performed at [facility]   Culture     Final   Value:        BLOOD CULTURE RECEIVED NO GROWTH TO DATE CULTURE WILL BE HELD FOR 5 DAYS BEFORE ISSUING A FINAL NEGATIVE REPORT     Performed at [facility]   Report Status PENDING   Incomplete  MRSA PCR SCREENING     Status: None   Collection Time    04/16/14 12:54 AM      Result Value Ref Range Status   MRSA by PCR NEGATIVE  NEGATIVE Final   Comment:            The GeneXpert MRSA Assay (FDA      approved for NASAL specimens     only), is one component of a     comprehensive MRSA colonization     surveillance program. It is not     intended to diagnose MRSA     infection nor to guide or     monitor treatment for     MRSA infections.  WOUND CULTURE     Status: None   Collection Time    04/16/14  4:02 AM      Result Value Ref Range Status   Specimen Description HAND   Final   Special Requests Normal   Final   Gram Stain     Final   Value: RARE WBC PRESENT,BOTH PMN AND MONONUCLEAR     NO SQUAMOUS EPITHELIAL CELLS SEEN     NO ORGANISMS SEEN     Performed at [facility]   Culture     Final   Value: NO GROWTH 2 DAYS     Performed at [facility]   Report Status 04/18/2014 FINAL   Final  CULTURE, ROUTINE-ABSCESS     Status: None   Collection Time    04/16/14  6:52 PM      Result Value Ref Range Status   Specimen Description ABSCESS HAND RIGHT   Final   Special Requests PATIENT ON FOLLOWING VANCOMYCIN   Final   Gram Stain     Final   Value: RARE WBC PRESENT, PREDOMINANTLY MONONUCLEAR     NO SQUAMOUS EPITHELIAL CELLS SEEN     NO ORGANISMS SEEN     Performed at [facility]   Culture     Final   Value: NO GROWTH 2 DAYS     Performed at [facility]   Report Status PENDING   Incomplete  ANAEROBIC CULTURE     Status: None   Collection Time    04/16/14  6:52 PM      Result Value Ref Range Status   Specimen Description ABSCESS HAND RIGHT   Final   Special Requests PATIENT ON FOLLOWING VANCOMYCIN   Final   Gram Stain     Final   Value: RARE WBC PRESENT, PREDOMINANTLY MONONUCLEAR     NO SQUAMOUS EPITHELIAL CELLS SEEN     NO ORGANISMS SEEN     Performed  at [facility]     Final   Value: NO ANAEROBES ISOLATED; CULTURE IN PROGRESS FOR 5 DAYS     Performed at [facility]   Report Status PENDING   Incomplete  TISSUE CULTURE     Status: None   Collection Time    04/16/14  6:56 PM      Result Value Ref Range Status     Specimen Description TISSUE HAND RIGHT   Final   Special Requests PATIENT ON FOLLOWING VANCOMYCIN   Final   Gram Stain     Final   Value: RARE WBC PRESENT,BOTH PMN AND MONONUCLEAR     NO SQUAMOUS EPITHELIAL CELLS SEEN     NO ORGANISMS SEEN     Performed at [facility]   Culture     Final   Value: NO GROWTH 1 DAY     Performed at [facility]   Report Status PENDING   Incomplete  ANAEROBIC CULTURE     Status: None   Collection Time    04/16/14  6:56 PM      Result Value Ref Range Status   Specimen Description TISSUE HAND RIGHT   Final   Special Requests PATIENT ON FOLLOWING VANCOMYCIN   Final   Gram Stain     Final   Value: RARE WBC PRESENT,BOTH PMN AND MONONUCLEAR     NO SQUAMOUS EPITHELIAL CELLS SEEN     NO ORGANISMS SEEN     Performed at [facility]   Culture     Final   Value: NO ANAEROBES ISOLATED; CULTURE IN PROGRESS FOR 5 DAYS     Performed at [facility]   Report Status PENDING   Incomplete    Coagulation Studies: No results found for this basename: LABPROT, INR,  in the last 72 hours  Hgb A1c: 5.6.  Imaging: No results found.   Medications:    Infusions: . lactated ringers 10 mL/hr at 04/16/14 1705    Scheduled Medications: . aspirin EC  81 mg Oral Daily  . heparin  5,000 Units Subcutaneous 3 times per day  . lisinopril  20 mg Oral Daily  . nicotine  21 mg Transdermal Daily  . sulfamethoxazole-trimethoprim  1 tablet Oral Q12H    PRN Medications: acetaminophen, HYDROmorphone (DILAUDID) injection, ketorolac, ondansetron (ZOFRAN) IV, ondansetron, oxyCODONE, senna-docusate   Assessment/ Plan:    Principal Problem:   Cellulitis of right hand Active Problems:   Cellulitis of hand   Essential hypertension, benign  #R hand and wrist cellulitis Continues to be afebrile and doing well.  Increased dose of opioids controlling pain.  She reports having some old Bactrim that she took three doses of as an outpatient with  improvement of her cellulitis.  Also, antibiogram sensitivities suggest Bactrim superior to doxy or clinda for MRSA at [facility].  Talked to Micro and they do not see any growth on any of the cultures other than coag neg staph on 1/2 blood cultures, which is contaminant.  Scheduled ortho follow-up on Tuesday and [facility] appointment in 1.5 weeks. -Discharge home this morning. -Switch from vancomycin to Bactrim 800 mg q12h for 10 day course (ending 04/25/14). -Keep wrist wrapped until ortho-hand examines on Tuesday as outpatient. -Keep wrist elevated per ortho recs. -Follow up final wound and blood cultures this afternoon. -Carb-modified diet. -Will discharge home with oxycodone 10 mg q6h PRN, #30 tablets to get to [facility] appoitment. -[place]ot-S to prevent constipation on  opioids.  #HTN Does not take medications at home, but reports using lisinopril in the past.  BP well controlled on lisinopril 20 mg daily. -Continue lisinopril to 20 mg daily and prescribe on discharge.   DVT PPX - heparin  CODE STATUS - Full code.  CONSULTS PLACED - Hand surgery.  DISPO - Disposition is deferred at this time, awaiting improvement of cellulitis.   Anticipated discharge in approximately 2-3 day(s).   The patient does not have a current PCP (No PCP Per Patient) and does need an [facility] hospital follow-up appointment after discharge.    Is the [facility] hospital follow-up appointment a one-time only appointment? yes.  Does the patient have transportation limitations that hinder transportation to clinic appointments? unknown   SERVICE NEEDED AT DISCHARGE - TO BE DETERMINED DURING HOSPITAL COURSE         Y = Yes, Blank = No PT:   OT:   RN:   Equipment:   Other:      Length of Stay: 4 day(s)   Signed: Luisa Dago, MD  PGY-1, Internal Medicine Resident Pager: [phone] (7AM-5PM) 04/19/2014, 8:35 AM

## 2014-04-19 NOTE — Discharge Summary (Signed)
Name: Judy Robles MRN: [id number] DOB: 06-Jul-1976 38 y.o. PCP: No Pcp Per Patient  Date of Admission: 04/15/2014  8:06 PM Date of Discharge: 04/19/2014 Attending Physician: Judy Edouard, Robles  Discharge Diagnosis:  Principal Problem:   Cellulitis of right hand Active Problems:   Cellulitis of hand   Essential hypertension, benign  Discharge Medications:   Medication List    STOP taking these medications       hydrocortisone cream 0.5 %     silver sulfADIAZINE 1 % cream  Commonly known as:  SILVADENE      TAKE these medications       acetaminophen 325 MG tablet  Commonly known as:  TYLENOL  Take 650 mg by mouth every 6 (six) hours as needed for mild pain.     lisinopril 20 MG tablet  Commonly known as:  PRINIVIL,ZESTRIL  Take 1 tablet (20 mg total) by mouth daily.     Oxycodone HCl 10 MG Tabs  Take 1 tablet (10 mg total) by mouth every 6 (six) hours as needed for severe pain.     senna-docusate 8.6-50 MG per tablet  Commonly known as:  Senokot-S  Take 1 tablet by mouth at bedtime as needed for mild constipation.     sulfamethoxazole-trimethoprim 800-160 MG per tablet  Commonly known as:  BACTRIM DS  Take 1 tablet by mouth every 12 (twelve) hours.        Disposition and follow-up:   Judy Robles was discharged from [facility] in Stable condition.  At the hospital follow up visit please address:  1.  Resolution of cellulitis, recommendations from ortho after appointment Robles Tuesday, establishment with PCP at [facility] and [facility].  2.  Labs / imaging needed at time of follow-up: BMP to check K and Cr after starting lisinopril.  3.  Pending labs/ test needing follow-up: Anaerobic culture results (NGTD)  Follow-up Appointments:     Follow-up Information   Follow up with Judy Robles 04/22/2014. (8:15 am)    Specialty:  Orthopedic Surgery   Contact information:   [address] [place]  [place] [address] [phone]       Follow up with Judy Robles 04/29/2014. (9:15 am)    Specialty:  Internal Medicine   Contact information:   [address] [place] [address] [phone]       Discharge Instructions:  Thank you for allowing [place] to be involved in your healthcare while you were hospitalized at [facility].   Please note that there have been changes to your home medications.  --> PLEASE LOOK AT YOUR DISCHARGE MEDICATION LIST FOR DETAILS.   Please call [phone] if you have any questions or concerns, or any difficulty getting any of your medications.  Please return to the ER if you have worsening of your symptoms or new severe symptoms arise.  Keep taking your antibiotics until they are all gone.  Do not remove the wrap Robles your right wrist until you see Judy Robles the hand surgeon Robles Tuesday morning.  Make sure to go to your follow up appointments with your hand surgeon and in the Internal Medicine Center.  You will need to establish with a PCP who can help manage your medications. Discharge Instructions   Call Robles for:  difficulty breathing, headache or visual disturbances    Complete by:  As directed      Call Robles for:  extreme fatigue  Complete by:  As directed      Call Robles for:  persistant dizziness or light-headedness    Complete by:  As directed      Call Robles for:  persistant nausea and vomiting    Complete by:  As directed      Call Robles for:  redness, tenderness, or signs of infection (pain, swelling, redness, odor or green/yellow discharge around incision site)    Complete by:  As directed      Call Robles for:  severe uncontrolled pain    Complete by:  As directed      Call Robles for:  temperature >100.4    Complete by:  As directed      Diet - low sodium heart healthy    Complete by:  As directed      Increase activity slowly    Complete by:  As directed      Leave dressing Robles - Keep it clean, dry, and intact until clinic  visit    Complete by:  As directed            Consultations: Treatment Team:  Judy Robles  Procedures Performed:  Dg Hand Complete Right  04/15/2014   CLINICAL DATA:  Right hand abscess around the fifth MCP. Redness and swelling down the wrist.  EXAM: RIGHT HAND - COMPLETE 3+ VIEW  COMPARISON:  10/13/2012  FINDINGS: Soft tissue swelling with soft tissue gas demonstrated superficial to the dorsal aspect of the fifth metacarpal bone. This is consistent with clinical history of abscess. No underlying bone changes to suggest osteomyelitis. No radiopaque soft tissue foreign bodies. No evidence of acute fracture or dislocation.  IMPRESSION: Soft tissue swelling and focal soft tissue gas over the fifth metacarpal region. No acute bony abnormalities.   Electronically Signed   By: Burman Nieves M.D.   Robles: 04/15/2014 21:23   Admission HPI:  Judy Robles is a 38 year old woman with history of HTN, rheumatoid arthritis, MRSA presenting with right hand abscess x 1 week. She had been working outside. She noticed a small red, pruritic bump. She thought she had an insect bite but never saw an insect. She has been using neosporin Robles it. It increased in size and eventually spontaneously erupted with yellow pus. The surrounding cellulitis has also been increasing. She reports pain In her R hand. Denies joint swelling. She had a previous similar abscess Robles her lower abdomen for which she had leftover bactrim. She took about 3 days worth with some improvement of the abscess and cellulitis but hasn't had any antibiotics since Saturday. No sick contacts. Reports fever to 104 Saturday, chills, nausea, vomiting. Denies chest pain, shortness of breath, abdominal pain, diarrhea, dysuria.   Also notes that she was diagnosed with DM2 but is not Robles medication. Reports polydipsia and polyuria.  Hospital Course by problem list: Principal Problem:   Cellulitis of right hand Active Problems:   Cellulitis of hand    Essential hypertension, benign   #R hand and wrist cellulitis Judy Robles was noted to have fluctuant and continued drainage of pus from he right hand Robles admission.  Wound and blood cultures were obtained, and she was started Robles IV vancomycin for coverage of gram positive bacteria.  Orthopedics hand surgery was consulted, and Judy Robles took Ms. Kattner to the operating room for incision and drainage.  Wound cultures were obtained during the procedure, and Judy Robles recommended continued IV vancomycin until the culture results came back.  She  grew coagulase negative staph in 1/2 blood cultures that was thought to be contaminant, and her tissue culture from the OR grew group B strep.  She received five days of IV vancomycin and was switched to Bactrim Robles discharge and given five more days worth to complete a 10 day course Robles 04/25/14.  Her pain was managed with IV and oral opioids, and she was discharged home with enough oxycodone to get her to a follow-up appointment.  She may benefit from chlorhexidine decontamination as an outpatient given her history of multiple abscesses in the past.  #Hypertension Ms. Custodio reports a history of hypertension, but she has not been Robles anti-hypertensive medications because she does not currently have a PCP.  Her blood pressure was consistently elevated, so she was started Robles lisinopril, which was titrated up to 20 mg daily with good control of her blood pressure.  She was given a prescription Robles discharge.  #Type 2 Diabetes Ms. Dedmon reports being diagnosed with type 2 diabetes in the past, but her hemoglobin A1C was 5.6 during this admission.  She is not currently taking any medications for diabetes.  Discharge Vitals:   BP 132/102  Pulse 59  Temp(Src) 97.9 F (36.6 C) (Oral)  Resp 18  Ht 5\' 3"  (1.6 m)  Wt 270 lb (122.471 kg)  BMI 47.84 kg/m2  SpO2 100%  LMP 03/22/2014  Discharge Labs:  Results for orders placed during the hospital encounter of 04/15/14 (from  the past 24 hour(s))  VANCOMYCIN, TROUGH     Status: None   Collection Time    04/18/14  5:40 PM      Result Value Ref Range   Vancomycin Tr 19.9  10.0 - 20.0 ug/mL  GLUCOSE, CAPILLARY     Status: None   Collection Time    04/19/14  6:08 AM      Result Value Ref Range   Glucose-Capillary 85  70 - 99 mg/dL   Comment 1 Notify RN      Signed: 04/21/14, Robles 04/19/2014, 8:35 AM    Services Ordered Robles Discharge: None. Equipment Ordered Robles Discharge: None.

## 2014-04-19 NOTE — Discharge Instructions (Signed)
·   Thank you for allowing [place] to be involved in your healthcare while you were hospitalized at [facility].   Please note that there have been changes to your home medications.  --> PLEASE LOOK AT YOUR DISCHARGE MEDICATION LIST FOR DETAILS.   Please call [phone] if you have any questions or concerns, or any difficulty getting any of your medications.  Please return to the ER if you have worsening of your symptoms or new severe symptoms arise.  Keep taking your antibiotics until they are all gone.  Do not remove the wrap on your right wrist until you see Dr. Melvyn Novas the hand surgeon on Tuesday morning.  Make sure to go to your follow up appointments with your hand surgeon and in the Internal Medicine Center.  You will need to establish with a PCP who can help manage your medications.

## 2014-04-20 LAB — TISSUE CULTURE

## 2014-04-20 LAB — CULTURE, ROUTINE-ABSCESS: CULTURE: NO GROWTH

## 2014-04-21 LAB — ANAEROBIC CULTURE

## 2014-04-22 LAB — CULTURE, BLOOD (ROUTINE X 2): Culture: NO GROWTH

## 2014-04-29 ENCOUNTER — Ambulatory Visit: Payer: Self-pay | Admitting: Internal Medicine

## 2014-05-01 ENCOUNTER — Ambulatory Visit (INDEPENDENT_AMBULATORY_CARE_PROVIDER_SITE_OTHER): Payer: Medicaid Other | Admitting: Internal Medicine

## 2014-05-01 ENCOUNTER — Encounter: Payer: Self-pay | Admitting: Internal Medicine

## 2014-05-01 VITALS — BP 142/67 | HR 67 | Temp 98.0°F | Ht 63.0 in | Wt 268.7 lb

## 2014-05-01 DIAGNOSIS — L03119 Cellulitis of unspecified part of limb: Secondary | ICD-10-CM

## 2014-05-01 DIAGNOSIS — L02519 Cutaneous abscess of unspecified hand: Secondary | ICD-10-CM

## 2014-05-01 DIAGNOSIS — L03113 Cellulitis of right upper limb: Secondary | ICD-10-CM

## 2014-05-01 DIAGNOSIS — R197 Diarrhea, unspecified: Secondary | ICD-10-CM | POA: Insufficient documentation

## 2014-05-01 DIAGNOSIS — I10 Essential (primary) hypertension: Secondary | ICD-10-CM

## 2014-05-01 MED ORDER — OXYCODONE-ACETAMINOPHEN 10-325 MG PO TABS: 1.0000 | ORAL_TABLET | Freq: Four times a day (QID) | ORAL | Status: DC | PRN

## 2014-05-01 NOTE — Assessment & Plan Note (Signed)
BP Readings from Last 3 Encounters:  05/01/14 142/67  04/19/14 132/102  04/19/14 132/102    Lab Results  Component Value Date   NA 135* 04/16/2014   K 3.8 04/16/2014   CREATININE 0.75 04/16/2014    Assessment: Blood pressure control:  slightly elevated in the setting of pain Progress toward BP goal:   near goal Comments: she reports compliance with lisinopril  Plan: Medications:  continue current medications:  Lisinopril 20mg  daily Other plans: check BMP this visit

## 2014-05-01 NOTE — Assessment & Plan Note (Signed)
Her wound appears to be healing well.  She says the orthopedic surgeon is happy with the progress and plans to remove the stitches next week.  She would like pain med to help with 8/10 hand pain that she continues to experience.  She says the surgeon mentioned she may need an additional procedure and plans to do imaging at next visit as well. - rx for Percocet 10/325 #30 (this is a one time rx and does not constitute an agreement for continue prescribing of narcotics). - she will see the surgeon next week; will defer to his expertise for further management  - she will keep the wound wrapped and dry (it was re-wrapped with clean Kerlix in clinic and she was provided with some to take home should she need it).

## 2014-05-01 NOTE — Assessment & Plan Note (Addendum)
Six loose BMs x 2 days and reports fever yesterday.  She is afebrile in clinic.  Her recent antibiotic use puts her at risk for cdiff.  She is unable to provide stool sample today.  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

## 2014-05-01 NOTE — Progress Notes (Signed)
Patient ID: Judy Robles, female   DOB: [birth year], 38 y.o.   MRN: [id number]  Internal Medicine Clinic Attending Date of visit: 05/01/2014  I saw and evaluated the patient.  I personally confirmed the key portions of the history and exam documented by Dr. Andrey Campanile and I reviewed pertinent patient test results.  The assessment, diagnosis, and plan were formulated together and I agree with the documentation in the resident's note.

## 2014-05-01 NOTE — Patient Instructions (Addendum)
1. Please do not take Tylenol while taking Percocet (Percocet has tylenol in it).  Be sure to see the hand surgeon next week and follow his recommendations.  Keep hand dry and wrapped.   2. Please take all medications as prescribed.    3. If you have worsening of your symptoms or new symptoms arise, please call the clinic ([phone]), or go to the ER immediately if symptoms are severe.  Please return stool sample to clinic for testing.

## 2014-05-01 NOTE — Progress Notes (Signed)
   Subjective:    Patient ID: Judy Robles, female    DOB: 10/31/1975, 38 y.o.   MRN: [id number]  HPI Comments: Ms. Willenbring is a 38 year old woman with a PMH of HTN.  She is here for hospital follow-up after recent admission for right hand cellulitis requiring I&D.  She saw the orthopedic surgeon last week who was pleased with her progress.  One of her stiches had come out and he plans for the remaining stitches to stay in for an additional 10 days.  Still with hand pain of 8/10.  She says the surgeon gave her percocet #40 on 09/01.  She has been taking 1 pill every four hours and has run out.  She has tried OTC Tylenol, ibuprofen without relief.  She completed Bactrim after discharge.  She got her bandage wet last night in the shower despite wrapping with a plastic bag.  She replaced the bandage with a dry one.  She noted the wound was draining a little serosanguinous drainage but not as much as when she was at the surgeon.      Review of Systems  Constitutional: Positive for fever and appetite change. Negative for chills.       T 101F yesterday. Decreased appetite.  Gastrointestinal: Positive for diarrhea. Negative for nausea, vomiting and blood in stool.       Six times per day x two days.  Genitourinary: Negative for dysuria.  Musculoskeletal:       Hand pain        Objective:   Physical Exam  Vitals reviewed. Constitutional: She is oriented to person, place, and time. She appears well-developed. No distress.  HENT:  Head: Normocephalic and atraumatic.  Mouth/Throat: Oropharynx is clear and moist. No oropharyngeal exudate.  Eyes: EOM are normal. Pupils are equal, round, and reactive to light.  Cardiovascular: Normal rate, regular rhythm and normal heart sounds.  Exam reveals no gallop and no friction rub.   No murmur heard. Pulmonary/Chest: Effort normal and breath sounds normal. No respiratory distress. She has no wheezes. She has no rales.  Abdominal: Soft. Bowel sounds are  normal. She exhibits no distension. There is no tenderness.  Musculoskeletal: Normal range of motion. She exhibits no edema and no tenderness.  Neurological: She is alert and oriented to person, place, and time. No cranial nerve deficit.  Skin: Skin is warm. She is not diaphoretic.  Right hand with ~ 3 inch healing incision with stitches in place.  TTP medial to fifth metacarpal.  No drainage or significant erythema.  Psychiatric: She has a normal mood and affect. Her behavior is normal.          Assessment & Plan:  Please see problem based assessment and plan.

## 2014-05-02 LAB — BASIC METABOLIC PANEL WITH GFR
BUN: 10 mg/dL (ref 6–23)
CHLORIDE: 105 meq/L (ref 96–112)
CO2: 25 meq/L (ref 19–32)
CREATININE: 0.79 mg/dL (ref 0.50–1.10)
Calcium: 9.3 mg/dL (ref 8.4–10.5)
GFR, Est African American: >89 mL/min
GFR, Est Non African American: >89 mL/min
GLUCOSE: 80 mg/dL (ref 70–99)
Potassium: 4.7 mEq/L (ref 3.5–5.3)
Sodium: 138 mEq/L (ref 135–145)

## 2014-05-16 ENCOUNTER — Emergency Department (HOSPITAL_COMMUNITY)
Admission: EM | Admit: 2014-05-16 | Discharge: 2014-05-17 | Disposition: A | Discharge status: Home / Self Care | Payer: Medicaid Other | Attending: Emergency Medicine | Admitting: Emergency Medicine

## 2014-05-16 ENCOUNTER — Encounter (HOSPITAL_COMMUNITY): Payer: Self-pay | Admitting: Emergency Medicine

## 2014-05-16 DIAGNOSIS — F17200 Nicotine dependence, unspecified, uncomplicated: Secondary | ICD-10-CM | POA: Insufficient documentation

## 2014-05-16 DIAGNOSIS — Z79899 Other long term (current) drug therapy: Secondary | ICD-10-CM | POA: Diagnosis not present

## 2014-05-16 DIAGNOSIS — R21 Rash and other nonspecific skin eruption: Secondary | ICD-10-CM | POA: Insufficient documentation

## 2014-05-16 DIAGNOSIS — Z8614 Personal history of Methicillin resistant Staphylococcus aureus infection: Secondary | ICD-10-CM | POA: Insufficient documentation

## 2014-05-16 DIAGNOSIS — M129 Arthropathy, unspecified: Secondary | ICD-10-CM | POA: Diagnosis not present

## 2014-05-16 DIAGNOSIS — K007 Teething syndrome: Secondary | ICD-10-CM | POA: Diagnosis not present

## 2014-05-16 DIAGNOSIS — E669 Obesity, unspecified: Secondary | ICD-10-CM | POA: Diagnosis not present

## 2014-05-16 DIAGNOSIS — R110 Nausea: Secondary | ICD-10-CM | POA: Diagnosis not present

## 2014-05-16 DIAGNOSIS — Z8669 Personal history of other diseases of the nervous system and sense organs: Secondary | ICD-10-CM | POA: Insufficient documentation

## 2014-05-16 DIAGNOSIS — I10 Essential (primary) hypertension: Secondary | ICD-10-CM | POA: Diagnosis not present

## 2014-05-16 DIAGNOSIS — K1370 Unspecified lesions of oral mucosa: Secondary | ICD-10-CM | POA: Diagnosis not present

## 2014-05-16 MED ORDER — MUPIROCIN CALCIUM 2 % EX CREA: 1.0000 "application " | TOPICAL_CREAM | Freq: Three times a day (TID) | CUTANEOUS | Status: DC

## 2014-05-16 MED ORDER — DIPHENHYDRAMINE HCL 25 MG PO CAPS
25.0000 mg | ORAL_CAPSULE | Freq: Once | ORAL | Status: AC
  Administered 2014-05-16: 25 mg via ORAL
  Filled 2014-05-16: qty 1

## 2014-05-16 MED ORDER — HYDROCODONE-ACETAMINOPHEN 5-325 MG PO TABS
2.0000 | ORAL_TABLET | Freq: Once | ORAL | Status: AC
Start: 2014-05-16 — End: 2014-05-16
  Administered 2014-05-16: 2 via ORAL
  Filled 2014-05-16: qty 2

## 2014-05-16 NOTE — ED Provider Notes (Signed)
CSN: [id number]     Arrival date & time 05/16/14  2203 History   First MD Initiated Contact with Patient 05/16/14 2242     Chief Complaint  Patient presents with  . Skin Ulcer  . Mouth Lesions  . Back Pain     (Consider location/radiation/quality/duration/timing/severity/associated sxs/prior Treatment) HPI Judy Robles is a 38 y.o. female with PMH of HTN, OSA, obesity, I&D of right hand 04/16/14 with hospitalization until 04/19/14 presenting with two day history of itchy skin lesions on bilateral chin, lip, left side of face and two on left mid back. Also "raw" lesion on upper palate. She cannot smoke due to the lesion. Patient is sharp, ache that has been unrelieved by benadryl, tylenol, ibuprofen. Patient has no more percocet from her surgery. Patient endorses fever of 101.8 today and mild nausea without emesis or abdominal pain. She denies history of herpes or cold sores in the past. No new sexual partners. No new foods, tick bites, lotions. Patient reports new detergent, pod form of tide instead of tide. No cp, sob, other back pain.     Past Medical History  Diagnosis Date  . Obesity   . Hypertension   . Arthritis   . Sleep apnea   . MRSA (methicillin resistant Staphylococcus aureus)    Past Surgical History  Procedure Laterality Date  . I&d extremity Right 04/16/2014    Procedure: IRRIGATION AND DEBRIDEMENT EXTREMITY;  Surgeon: Sharma Covert, MD;  Location: [facility] OR;  Service: Orthopedics;  Laterality: Right;   No family history on file. History  Substance Use Topics  . Smoking status: Current Every Day Smoker -- 0.25 packs/day    Types: Cigarettes  . Smokeless tobacco: Not on file     Comment: 1/2PPD  . Alcohol Use: No   OB History   Grav Para Term Preterm Abortions TAB SAB Ect Mult Living                 Review of Systems  Constitutional: Positive for fever. Negative for chills.  HENT: Negative for congestion and rhinorrhea.   Eyes: Negative for visual disturbance.   Respiratory: Negative for cough and shortness of breath.   Cardiovascular: Negative for chest pain and palpitations.  Gastrointestinal: Positive for nausea. Negative for vomiting and diarrhea.  Genitourinary: Negative for dysuria and hematuria.  Musculoskeletal: Negative for back pain and gait problem.  Skin: Positive for rash.  Neurological: Negative for weakness and headaches.      Allergies  Review of patient's allergies indicates no known allergies.  Home Medications   Prior to Admission medications   Medication Sig Start Date End Date Taking? Authorizing Provider  acetaminophen (TYLENOL) 325 MG tablet Take 650 mg by mouth every 6 (six) hours as needed for mild pain.   Yes Historical Provider, MD  lisinopril (PRINIVIL,ZESTRIL) 20 MG tablet Take 1 tablet (20 mg total) by mouth daily. 04/19/14  Yes Luisa Dago, MD  oxyCODONE-acetaminophen (PERCOCET) 10-325 MG per tablet Take 1 tablet by mouth every 6 (six) hours as needed for pain. 05/01/14  Yes Alex Elson Clan, DO  senna-docusate (SENOKOT-S) 8.6-50 MG per tablet Take 1 tablet by mouth at bedtime as needed for mild constipation. 04/19/14  Yes Luisa Dago, MD  mupirocin cream (BACTROBAN) 2 % Apply 1 application topically 3 (three) times daily. 05/16/14   Louann Sjogren, PA-C   BP 147/84  Pulse 82  Temp(Src) 98 F (36.7 C) (Oral)  Resp 19  Ht 5\' 3"  (1.6 m)  Wt  260 lb (117.935 kg)  BMI 46.07 kg/m2  SpO2 100%  LMP 04/27/2014 Physical Exam  Nursing note and vitals reviewed. Constitutional: She appears well-developed and well-nourished. No distress.  HENT:  Head: Normocephalic and atraumatic.  No trismus, neck lesions or masses. Poor dentition. 71mm area of inflammation midline on upper palate. Not abscess.  Eyes: Conjunctivae and EOM are normal. Right eye exhibits no discharge. Left eye exhibits no discharge.  Cardiovascular: Normal rate, regular rhythm and normal heart sounds.   Pulmonary/Chest: Effort normal and breath  sounds normal. No respiratory distress. She has no wheezes.  Abdominal: Soft. Bowel sounds are normal. She exhibits no distension. There is no tenderness.  Neurological: She is alert. She exhibits normal muscle tone. Coordination normal.  Skin: Skin is warm and dry. She is not diaphoretic.  Multiple 2-3cm lesions on back, bilateral chin and lip and left face and neck. Lesions are excoriations and scabs. Back lesions with mild induration, erythema and are tender to palpation.    ED Course  Procedures (including critical care time) Labs Review Labs Reviewed - No data to display  Imaging Review No results found.   EKG Interpretation None     Meds given in ED:  Medications  diphenhydrAMINE (BENADRYL) capsule 25 mg (25 mg Oral Given 05/16/14 2329)  HYDROcodone-acetaminophen (NORCO/VICODIN) 5-325 MG per tablet 2 tablet (2 tablets Oral Given 05/16/14 2329)    New Prescriptions   MUPIROCIN CREAM (BACTROBAN) 2 %    Apply 1 application topically 3 (three) times daily.      MDM   Final diagnoses:  Excoriated rash  Mouth lesion   Patient with rash to back, neck, bilateral chin and left face and neck. Patient with significant excoriations. Lesions with mild erythema, induration and tenderness. Lesions may be infected. Script for bactroban, three times a day. Lesion in mouth due to inflammation. No abscess. Patient without signs of peritonsilar abscess. Continue to use tylenol, ibuprofen, benadryl for symptomatic relief.  Patient given norco in ED with only mild improvement in pain. Discussed no script for pain meds. Patient told not to scratch lesions to prevent secondary infection.  Patient without a PCP and encouraged to establish care and follow up. Resources provided.  Discussed return precautions with patient. Discussed all results and patient verbalizes understanding and agrees with plan.  This is a shared patient. This patient was discussed with the physician who saw and evaluated  the patient and agrees with the plan.     Louann Sjogren, PA-C 05/17/14 0011

## 2014-05-16 NOTE — ED Provider Notes (Signed)
  Face-to-face evaluation   History: She complains of a rash, for several days. She was on Bactrim prior to this,. She states that the rash hurts, but does not itch. She states that the wound on her right hand, is healing.  Physical exam: Alert, anxious, cooperative. Right hand surgical wound is healing without signs of infection. Skin with scattered excoriations, varying sizes from 0.5-1.5 cm. There are no areas of fluctuance or drainage.  Medical screening examination/treatment/procedure(s) were conducted as a shared visit with non-physician practitioner(s) and myself.  I personally evaluated the patient during the encounter  Flint Melter, MD 05/18/14 1205

## 2014-05-16 NOTE — Discharge Instructions (Signed)
Return to the emergency room with worsening of symptoms, new symptoms or with symptoms that are concerning, fevers, warmth, redness, swelling and tenderness of rash.  DO NOT SCRATCH. Take benadryl for itchiness and use bandaids to help stop scratching. Use ibuprofen and tylenol for the pain. Apply bactroban ointment to areas three times a day.  Please call your doctor for a followup appointment within 24-48 hours. When you talk to your doctor please let them know that you were seen in the emergency department and have them acquire all of your records so that they can discuss the findings with you and formulate a treatment plan to fully care for your new and ongoing problems. If you do not have a primary care provider please call the number below under ED resources to establish care with a provider and follow up.    Emergency Department Resource Guide 1) Find a Doctor and Pay Out of Pocket Although you won't have to find out who is covered by your insurance plan, it is a good idea to ask around and get recommendations. You will then need to call the office and see if the doctor you have chosen will accept you as a new patient and what types of options they offer for patients who are self-pay. Some doctors offer discounts or will set up payment plans for their patients who do not have insurance, but you will need to ask so you aren't surprised when you get to your appointment.  2) Contact Your Local Health Department Not all health departments have doctors that can see patients for sick visits, but many do, so it is worth a call to see if yours does. If you don't know where your local health department is, you can check in your phone book. The CDC also has a tool to help you locate your state's health department, and many state websites also have listings of all of their local health departments.  3) Find a Walk-in Clinic If your illness is not likely to be very severe or complicated, you may want to try  a walk in clinic. These are popping up all over the country in pharmacies, drugstores, and shopping centers. They're usually staffed by nurse practitioners or physician assistants that have been trained to treat common illnesses and complaints. They're usually fairly quick and inexpensive. However, if you have serious medical issues or chronic medical problems, these are probably not your best option.  No Primary Care Doctor: - Call Health Connect at  [phone] - they can help you locate a primary care doctor that  accepts your insurance, provides certain services, etc. - Physician Referral Service- [phone]  Chronic Pain Problems: Organization         Address  Phone   Notes  [facility]  [phone] Patients need to be referred by their primary care doctor.   Medication Assistance: Organization         Address  Phone   Notes  [facility] Medication [facility] [address] [place] [place], [place] [address] [phone] --Must be a resident of [facility] -- Must have NO insurance coverage whatsoever (no Medicaid/ Medicare, etc.) -- The pt. MUST have a primary care doctor that directs their care regularly and follows them in the community   [facility]  [phone]   [employer]ning  [phone]    Agencies that provide inexpensive medical care: Retail buyer  Notes  [facility]  [phone]   [facility]    [phone]   [facility] [address] [place], [place] [address] [phone]   [facility] [address] [place] [phone]   Planned Parenthood    [phone]   [facility]    [phone]   [facility]  [address], [place] Phone:  [phone], Fax:  [phone] Hours of Operation:  9 am - 6 pm, M-F.  Also accepts Medicaid/Medicare and self-pay.  [facility]  [address], [place] Phone: [phone], Fax: [phone]. Hours of Operation:  8:30 am - 5:30 pm, M-F.  Also accepts Medicaid and self-pay.  [facility] [place] [address], [place] Phone: [phone]   [facility] [address] Natasha Bence [place], [place] [phone], Ext. [phone] Mondays & Thursdays: 7-9 AM.  First 15 patients are seen on a first come, first serve basis.    Medicaid-accepting [facility] Providers:  Organization         Address  Phone   Notes  [facility] [address], [place] [phone] Also accepts self-pay patients.  [facility] [address] Laurell Josephs [place], [place]  [phone]   [facility] [address], [place] [phone]   [facility] [address], [place] [phone]   Renaye Rakers [address], [place]   [phone] Only accepts [place] Access [place] patients after they have their name applied to their card.   Self-Pay (no insurance) in [facility]:  Organization         Address  Phone   Notes  Sickle Cell Patients, [facility] [address] [place], [place] [phone]   [facility] [address] [place], [place] [phone]   [facility]  [address], [place] [phone]   [facility]/Dr. Osei-Bonsu  [address], [place] or [address], [place] [phone] Phone number for both [place] and [place] locations is the same.  [facility] [address], [place] [phone]   [facility] [address] [place] or [address] [phone]   [facility] [address], [place] [phone], phone; [phone], fax  Sees patients 1st and 3rd Saturday of every month.  Must not qualify for public or private insurance (i.e. Medicaid, Medicare, [place] Health Choice, Veterans' Benefits)  Household income should be no more than 200% of the poverty level The clinic cannot treat you if you are pregnant or think you are pregnant  Sexually transmitted diseases are not treated at the clinic.    Dental Care: Organization         Address  Phone  Notes  [facility] Department of [facility] [address] [place], [place] [phone] Accepts children up to age 66 who are enrolled in [place] or [place] Health Choice; pregnant women with a Medicaid card; and children who have applied for Medicaid or [place] Health Choice, but were declined, whose parents can pay a reduced fee at time of service.  [facility]  Department of [facility]  [address], [place] [phone] Accepts children up to age 31 who are enrolled in [place] or [place] Health Choice; pregnant women with a Medicaid card; and children who have applied for Medicaid or [place] Health Choice, but were declined, whose parents can pay a reduced fee at time of service.  [facility]  [address] [place], [place] [phone] Patients are seen by appointment only. Walk-ins are not accepted. [facility] will see patients 68 years of age and older. Monday - Tuesday (8am-5pm) Most Wednesdays (8:30-5pm) $30 per visit, cash only  [facility]  [address], [facility] [phone] Patients are seen by appointment only. Walk-ins are not accepted. [facility] will see patients 73 years of age and older. One Wednesday Evening (Monthly: Volunteer Based).  $30 per visit, cash only  [employer]  [phone] for adults; Children under age 54, call Graduate Pediatric Dentistry at [phone]. Children aged 2-14, please call [phone] to  request a pediatric application.  Dental services are provided in all areas of dental care including fillings, crowns and bridges, complete and partial dentures, implants, gum treatment, root canals, and extractions. Preventive care is also provided. Treatment is provided to both adults and children. Patients are selected via a lottery and there is often a waiting list.   [facility] [address] [place]  [phone] [email or web address]   [facility] [address] [place], [place] [phone], Ext. [phone] Second and Fourth Thursday of each month, opens at 6:30 AM; Clinic ends at 9 AM.  Patients are seen on a first-come first-served basis, and a limited number are seen during each clinic.   [facility]  [address] Ether Griffins [place], [place] [phone]   Eligibility Requirements You must have lived in [place], [place], or [place] counties for at least the last three months.   You cannot be eligible for state or federal sponsored [place], including [employer], [place], or [employer].   You generally cannot be eligible for healthcare insurance through your employer.    How to apply: Eligibility screenings are held every Tuesday and Wednesday afternoon from 1:00 pm until 4:00 pm. You do not need an appointment for the interview!  [facility] [address] [place], [place] [phone]   [facility] Health Department  [phone]   [facility] Health Department  [phone]   [facility] Health Department  [phone]    Behavioral Health Resources in the Community: Intensive Outpatient Programs Organization         Address  Phone  Notes  [facility] [address] [place], [place] [phone]   [facility] Outpatient [address] [place], [place] [phone]   [facility] [address] [place], [place]  [phone]   [facility] Mental Health [address],  [place], [place] [phone] or [phone]   Substance Abuse Resources Organization         Address  Phone  Notes  [facility]  [phone]   [facility]  [phone]   [facility]  [phone]   Floydene Flock  [phone]   Residential & Outpatient Substance Abuse Program  [phone]   Psychological Services Organization         Address  Phone  Notes  [facility]  [phone]   [facility]  [phone]   [facility] [address], [place] [phone] or [phone]    Mobile Crisis Teams Organization         Address  Phone  Notes  [facility], Mobile Crisis Care Unit  [phone]   [facility]  [address] [place], [place] [phone]   Doristine Locks [address] [place] [phone]    Self-Help/Support Groups Organization         Address  Phone             Notes  [facility] [place] - variety of support groups  [phone]- [phone] Call for more information  Narcotics Anonymous (NA), [facility] [address], [employer] [place]  2 meetings at this location   Statistician         Address  Phone  Notes  [facility] [address],    [place]  [phone]   [facility]  [address] [place] [address], [place], [place] [phone]   [facility] [address] [place], [place] [phone] Admissions: 8am-3pm M-F  [facility] [address]    [place], [place] [phone]   [facility] [address] [place], [place], [place] [phone]   [facility] [address],  [place], [place] [phone]   [facility] - Intensive Outpatient [address] Laure[address] Joseph[address], [place], [place] [phone]   [facility] ([facility] [address] [place]  [place], [place] [phone] or [phone]   [facility] ([facility]) [address], [place], [place] [phone] Accepts Medicaid  [facility] [address]  [place] [phone] Substance Abuse/Addiction Treatment   [facility] Organization         Address  Phone  Notes  [facility]  [phone]   Angie Fava, PhD [address] Ervin Knack [place], [place]   [phone] or [phone]   [facility] Behavioral   [address] [place], [place] [phone]   [facility] [address], [place], [place] [phone] Insurance/Medicaid/sponsorship through [facility] [address]                                    [place], [place] [phone] Therapy/tele-psych/case  [facility] [address], [place] [phone]    Dr. Lolly Mustache  [phone]   [facility]  [employer] [facility] Dept. 1) [address], [place] 2) [address] [place] [address], [place] 3)  [address], [place] [phone]  [phone]   [facility] Child Abuse Hotline 850-387-9633 or [phone] (After Hours)         Contact Dermatitis Contact dermatitis is a reaction to certain substances that touch the skin. Contact dermatitis can be either irritant contact dermatitis or allergic contact dermatitis. Irritant contact dermatitis does not require previous exposure to the substance for a reaction to occur.Allergic contact dermatitis only occurs if you have been exposed to the substance before. Upon a repeat exposure, your body reacts to the substance.  CAUSES  Many substances can cause contact dermatitis. Irritant dermatitis is most commonly caused by repeated exposure  to mildly irritating substances, such as:  Makeup.  Soaps.  Detergents.  Bleaches.  Acids.  Metal salts, such as nickel. Allergic  contact dermatitis is most commonly caused by exposure to:  Poisonous plants.  Chemicals (deodorants, shampoos).  Jewelry.  Latex.  Neomycin in triple antibiotic cream.  Preservatives in products, including clothing. SYMPTOMS  The area of skin that is exposed may develop:  Dryness or flaking.  Redness.  Cracks.  Itching.  Pain or a burning sensation.  Blisters. With allergic contact dermatitis, there may also be swelling in areas such as the eyelids, mouth, or genitals.  DIAGNOSIS  Your caregiver can usually tell what the problem is by doing a physical exam. In cases where the cause is uncertain and an allergic contact dermatitis is suspected, a patch skin test may be performed to help determine the cause of your dermatitis. TREATMENT Treatment includes protecting the skin from further contact with the irritating substance by avoiding that substance if possible. Barrier creams, powders, and gloves may be helpful. Your caregiver may also recommend:  Steroid creams or ointments applied 2 times daily. For best results, soak the rash area in cool water for 20 minutes. Then apply the medicine. Cover the area with a plastic wrap. You can store the steroid cream in the refrigerator for a "chilly" effect on your rash. That may decrease itching. Oral steroid medicines may be needed in more severe cases.  Antibiotics or antibacterial ointments if a skin infection is present.  Antihistamine lotion or an antihistamine taken by mouth to ease itching.  Lubricants to keep moisture in your skin.  Burow's solution to reduce redness and soreness or to dry a weeping rash. Mix one packet or tablet of solution in 2 cups cool water. Dip a clean washcloth in the mixture, wring it out a bit, and put it on the affected area. Leave the cloth in place for 30 minutes. Do this as often as possible throughout the day.  Taking several cornstarch or baking soda baths daily if the area is too large to  cover with a washcloth. Harsh chemicals, such as alkalis or acids, can cause skin damage that is like a burn. You should flush your skin for 15 to 20 minutes with cold water after such an exposure. You should also seek immediate medical care after exposure. Bandages (dressings), antibiotics, and pain medicine may be needed for severely irritated skin.  HOME CARE INSTRUCTIONS  Avoid the substance that caused your reaction.  Keep the area of skin that is affected away from hot water, soap, sunlight, chemicals, acidic substances, or anything else that would irritate your skin.  Do not scratch the rash. Scratching may cause the rash to become infected.  You may take cool baths to help stop the itching.  Only take over-the-counter or prescription medicines as directed by your caregiver.  See your caregiver for follow-up care as directed to make sure your skin is healing properly. SEEK MEDICAL CARE IF:   Your condition is not better after 3 days of treatment.  You seem to be getting worse.  You see signs of infection such as swelling, tenderness, redness, soreness, or warmth in the affected area.  You have any problems related to your medicines. Document Released: 08/05/2000 Document Revised: 10/31/2011 Document Reviewed: 01/11/2011 [facility] Patient Information 2015 [place], [place]. This information is not intended to replace advice given to you by your health care provider. Make sure you discuss any questions you have with your health care provider.

## 2014-05-16 NOTE — ED Notes (Signed)
Patient arrives with complaint of painful skin lesions. States that the has had them get "really bad" in the past. Shows site on right hand where surgery was required. Tonight states that she has one in the roof of her mouth and one in her mid back and explains they are severely painful. Area on back appears scabbed with minimal induration; surrounding area is tender but not discolored. Reports recent history of hospitalization for an area on her abdomen which caused her to be very sick with nausea and vomiting.

## 2014-05-17 NOTE — ED Notes (Signed)
Patient is alert and orientedx4.  Patient was explained discharge instructions and they understood them with no questions.  The patient's baby's father, Elsie Saas is coming to take the patient home.

## 2014-05-18 NOTE — ED Provider Notes (Signed)
Medical screening examination/treatment/procedure(s) were conducted as a shared visit with non-physician practitioner(s) and myself.  I personally evaluated the patient during the encounter.   EKG Interpretation None       Flint Melter, MD 05/18/14 1206

## 2015-07-02 ENCOUNTER — Inpatient Hospital Stay (HOSPITAL_COMMUNITY)
Admission: AD | Admit: 2015-07-02 | Discharge: 2015-07-04 | DRG: 683 | Disposition: A | Discharge status: Home / Self Care | Payer: Medicaid Other | Source: Other Acute Inpatient Hospital | Attending: Internal Medicine | Admitting: Internal Medicine

## 2015-07-02 ENCOUNTER — Inpatient Hospital Stay: Admit: 2015-07-02 | Payer: Self-pay | Admitting: Cardiovascular Disease

## 2015-07-02 DIAGNOSIS — R112 Nausea with vomiting, unspecified: Secondary | ICD-10-CM | POA: Diagnosis not present

## 2015-07-02 DIAGNOSIS — Z87440 Personal history of urinary (tract) infections: Secondary | ICD-10-CM

## 2015-07-02 DIAGNOSIS — F17210 Nicotine dependence, cigarettes, uncomplicated: Secondary | ICD-10-CM | POA: Diagnosis present

## 2015-07-02 DIAGNOSIS — G4730 Sleep apnea, unspecified: Secondary | ICD-10-CM | POA: Diagnosis present

## 2015-07-02 DIAGNOSIS — D649 Anemia, unspecified: Secondary | ICD-10-CM | POA: Diagnosis present

## 2015-07-02 DIAGNOSIS — K529 Noninfective gastroenteritis and colitis, unspecified: Secondary | ICD-10-CM | POA: Diagnosis present

## 2015-07-02 DIAGNOSIS — G8929 Other chronic pain: Secondary | ICD-10-CM | POA: Diagnosis present

## 2015-07-02 DIAGNOSIS — Z79891 Long term (current) use of opiate analgesic: Secondary | ICD-10-CM | POA: Diagnosis not present

## 2015-07-02 DIAGNOSIS — E861 Hypovolemia: Secondary | ICD-10-CM | POA: Diagnosis present

## 2015-07-02 DIAGNOSIS — R197 Diarrhea, unspecified: Secondary | ICD-10-CM | POA: Diagnosis not present

## 2015-07-02 DIAGNOSIS — E860 Dehydration: Secondary | ICD-10-CM | POA: Diagnosis present

## 2015-07-02 DIAGNOSIS — Z6841 Body Mass Index (BMI) 40.0 and over, adult: Secondary | ICD-10-CM

## 2015-07-02 DIAGNOSIS — I959 Hypotension, unspecified: Secondary | ICD-10-CM | POA: Diagnosis not present

## 2015-07-02 DIAGNOSIS — N179 Acute kidney failure, unspecified: Principal | ICD-10-CM | POA: Diagnosis present

## 2015-07-02 DIAGNOSIS — I9589 Other hypotension: Secondary | ICD-10-CM | POA: Diagnosis present

## 2015-07-02 DIAGNOSIS — F419 Anxiety disorder, unspecified: Secondary | ICD-10-CM | POA: Diagnosis present

## 2015-07-02 DIAGNOSIS — Z23 Encounter for immunization: Secondary | ICD-10-CM

## 2015-07-02 DIAGNOSIS — E669 Obesity, unspecified: Secondary | ICD-10-CM | POA: Diagnosis present

## 2015-07-02 DIAGNOSIS — I10 Essential (primary) hypertension: Secondary | ICD-10-CM | POA: Diagnosis present

## 2015-07-02 DIAGNOSIS — N170 Acute kidney failure with tubular necrosis: Secondary | ICD-10-CM | POA: Diagnosis not present

## 2015-07-02 LAB — CBC WITH DIFFERENTIAL/PLATELET
BASOS PCT: 0 %
Basophils Absolute: 0 10*3/uL (ref 0.0–0.1)
EOS PCT: 1 %
Eosinophils Absolute: 0.1 10*3/uL (ref 0.0–0.7)
HCT: 31.3 % — ABNORMAL LOW (ref 36.0–46.0)
Hemoglobin: 10.7 g/dL — ABNORMAL LOW (ref 12.0–15.0)
Lymphocytes Relative: 24 %
Lymphs Abs: 2.4 10*3/uL (ref 0.7–4.0)
MCH: 31.9 pg (ref 26.0–34.0)
MCHC: 34.2 g/dL (ref 30.0–36.0)
MCV: 93.4 fL (ref 78.0–100.0)
MONO ABS: 0.7 10*3/uL (ref 0.1–1.0)
MONOS PCT: 6 %
NEUTROS PCT: 69 %
Neutro Abs: 7.1 10*3/uL (ref 1.7–7.7)
PLATELETS: 234 10*3/uL (ref 150–400)
RBC: 3.35 MIL/uL — ABNORMAL LOW (ref 3.87–5.11)
RDW: 13.5 % (ref 11.5–15.5)
WBC: 10.3 10*3/uL (ref 4.0–10.5)

## 2015-07-02 LAB — LACTIC ACID, PLASMA: LACTIC ACID, VENOUS: 1.7 mmol/L (ref 0.5–2.0)

## 2015-07-02 LAB — MRSA PCR SCREENING: MRSA by PCR: NEGATIVE

## 2015-07-02 NOTE — Progress Notes (Addendum)
Made MD Toniann Fail aware of pts labs being available and much different than the labs drawn at [facility].  Pt is resting well alert and orientated x 4. Asking for food and pain medication, MD Made aware.

## 2015-07-03 ENCOUNTER — Inpatient Hospital Stay (HOSPITAL_COMMUNITY): Payer: Medicaid Other

## 2015-07-03 ENCOUNTER — Encounter (HOSPITAL_COMMUNITY): Payer: Self-pay | Admitting: Internal Medicine

## 2015-07-03 DIAGNOSIS — I9589 Other hypotension: Secondary | ICD-10-CM

## 2015-07-03 DIAGNOSIS — R112 Nausea with vomiting, unspecified: Secondary | ICD-10-CM

## 2015-07-03 DIAGNOSIS — I959 Hypotension, unspecified: Secondary | ICD-10-CM

## 2015-07-03 DIAGNOSIS — G8929 Other chronic pain: Secondary | ICD-10-CM | POA: Diagnosis present

## 2015-07-03 DIAGNOSIS — D649 Anemia, unspecified: Secondary | ICD-10-CM

## 2015-07-03 DIAGNOSIS — N179 Acute kidney failure, unspecified: Principal | ICD-10-CM

## 2015-07-03 DIAGNOSIS — N171 Acute kidney failure with acute cortical necrosis: Secondary | ICD-10-CM

## 2015-07-03 DIAGNOSIS — R197 Diarrhea, unspecified: Secondary | ICD-10-CM

## 2015-07-03 LAB — BASIC METABOLIC PANEL
ANION GAP: 11 (ref 5–15)
BUN: 34 mg/dL — ABNORMAL HIGH (ref 6–20)
CALCIUM: 8.4 mg/dL — AB (ref 8.9–10.3)
CO2: 26 mmol/L (ref 22–32)
Chloride: 97 mmol/L — ABNORMAL LOW (ref 101–111)
Creatinine, Ser: 3.58 mg/dL — ABNORMAL HIGH (ref 0.44–1.00)
GFR, EST AFRICAN AMERICAN: 17 mL/min — AB (ref >60)
GFR, EST NON AFRICAN AMERICAN: 15 mL/min — AB (ref >60)
Glucose, Bld: 115 mg/dL — ABNORMAL HIGH (ref 65–99)
Potassium: 3.6 mmol/L (ref 3.5–5.1)
Sodium: 134 mmol/L — ABNORMAL LOW (ref 135–145)

## 2015-07-03 LAB — URINALYSIS, ROUTINE W REFLEX MICROSCOPIC
BILIRUBIN URINE: NEGATIVE
Glucose, UA: NEGATIVE mg/dL
KETONES UR: NEGATIVE mg/dL
Nitrite: NEGATIVE
PH: 5.5 (ref 5.0–8.0)
Protein, ur: NEGATIVE mg/dL
Specific Gravity, Urine: 1.008 (ref 1.005–1.030)
UROBILINOGEN UA: 0.2 mg/dL (ref 0.0–1.0)

## 2015-07-03 LAB — CBC
HCT: 31.9 % — ABNORMAL LOW (ref 36.0–46.0)
HEMOGLOBIN: 10.6 g/dL — AB (ref 12.0–15.0)
MCH: 30.5 pg (ref 26.0–34.0)
MCHC: 33.2 g/dL (ref 30.0–36.0)
MCV: 91.9 fL (ref 78.0–100.0)
Platelets: 251 10*3/uL (ref 150–400)
RBC: 3.47 MIL/uL — AB (ref 3.87–5.11)
RDW: 13.3 % (ref 11.5–15.5)
WBC: 10 10*3/uL (ref 4.0–10.5)

## 2015-07-03 LAB — SODIUM, URINE, RANDOM: Sodium, Ur: 42 mmol/L

## 2015-07-03 LAB — LACTIC ACID, PLASMA: LACTIC ACID, VENOUS: 1.6 mmol/L (ref 0.5–2.0)

## 2015-07-03 LAB — COMPREHENSIVE METABOLIC PANEL
ALT: 23 U/L (ref 14–54)
AST: 33 U/L (ref 15–41)
Albumin: 2.7 g/dL — ABNORMAL LOW (ref 3.5–5.0)
Alkaline Phosphatase: 44 U/L (ref 38–126)
Anion gap: 9 (ref 5–15)
BUN: 35 mg/dL — ABNORMAL HIGH (ref 6–20)
CO2: 27 mmol/L (ref 22–32)
CREATININE: 4.34 mg/dL — AB (ref 0.44–1.00)
Calcium: 8.1 mg/dL — ABNORMAL LOW (ref 8.9–10.3)
Chloride: 95 mmol/L — ABNORMAL LOW (ref 101–111)
GFR calc non Af Amer: 12 mL/min — ABNORMAL LOW (ref >60)
GFR, EST AFRICAN AMERICAN: 14 mL/min — AB (ref >60)
GLUCOSE: 96 mg/dL (ref 65–99)
Potassium: 3.4 mmol/L — ABNORMAL LOW (ref 3.5–5.1)
SODIUM: 131 mmol/L — AB (ref 135–145)
Total Bilirubin: 0.4 mg/dL (ref 0.3–1.2)
Total Protein: 6.4 g/dL — ABNORMAL LOW (ref 6.5–8.1)

## 2015-07-03 LAB — URINE MICROSCOPIC-ADD ON

## 2015-07-03 LAB — CK: Total CK: 317 U/L — ABNORMAL HIGH (ref 38–234)

## 2015-07-03 LAB — CREATININE, URINE, RANDOM: Creatinine, Urine: 66.71 mg/dL

## 2015-07-03 LAB — PROCALCITONIN: PROCALCITONIN: 0.33 ng/mL

## 2015-07-03 MED ORDER — ONDANSETRON HCL 4 MG/2ML IJ SOLN: 4.0000 mg | Freq: Four times a day (QID) | INTRAMUSCULAR | Status: DC | PRN

## 2015-07-03 MED ORDER — SODIUM CHLORIDE 0.9 % IV SOLN
INTRAVENOUS | Status: DC
  Administered 2015-07-03: 20:00:00 via INTRAVENOUS
  Administered 2015-07-03: 1000 mL via INTRAVENOUS

## 2015-07-03 MED ORDER — INFLUENZA VAC SPLIT QUAD 0.5 ML IM SUSY
0.5000 mL | PREFILLED_SYRINGE | INTRAMUSCULAR | Status: AC
  Administered 2015-07-04: 0.5 mL via INTRAMUSCULAR
  Filled 2015-07-03: qty 0.5

## 2015-07-03 MED ORDER — ONDANSETRON HCL 4 MG PO TABS: 4.0000 mg | ORAL_TABLET | Freq: Four times a day (QID) | ORAL | Status: DC | PRN

## 2015-07-03 MED ORDER — SODIUM CHLORIDE 0.9 % IV BOLUS (SEPSIS)
1000.0000 mL | Freq: Once | INTRAVENOUS | Status: AC
  Administered 2015-07-03: 1000 mL via INTRAVENOUS

## 2015-07-03 MED ORDER — ALPRAZOLAM 0.5 MG PO TABS
0.5000 mg | ORAL_TABLET | Freq: Three times a day (TID) | ORAL | Status: DC | PRN
  Administered 2015-07-03 – 2015-07-04 (×3): 0.5 mg via ORAL
  Filled 2015-07-03 (×3): qty 1

## 2015-07-03 MED ORDER — ENOXAPARIN SODIUM 30 MG/0.3ML ~~LOC~~ SOLN
30.0000 mg | SUBCUTANEOUS | Status: DC
  Administered 2015-07-03 – 2015-07-04 (×2): 30 mg via SUBCUTANEOUS
  Filled 2015-07-03 (×2): qty 0.3

## 2015-07-03 MED ORDER — OXYCODONE HCL 5 MG PO TABS
5.0000 mg | ORAL_TABLET | Freq: Four times a day (QID) | ORAL | Status: DC | PRN
  Administered 2015-07-03 – 2015-07-04 (×5): 5 mg via ORAL
  Filled 2015-07-03 (×5): qty 1

## 2015-07-03 NOTE — Progress Notes (Signed)
Initial Nutrition Assessment  DOCUMENTATION CODES:   Morbid obesity  INTERVENTION:   No nutrition intervention at this time --- patient declined  NUTRITION DIAGNOSIS:   Unintentional weight loss related to other (see comment) (unknown etiology) as evidenced by per patient/family report  GOAL:   Patient will meet greater than or equal to 90% of their needs  MONITOR:   PO intake, Labs, Weight trends, I & O's  REASON FOR ASSESSMENT:   Malnutrition Screening Tool  ASSESSMENT:   39 y.o. Female with history of HTN, chronic pain and anxiety was [facility] because of ARF and possible need of dialysis. Patient had gone to her dentist office today and over that patient was sent to ER because patient was feeling weak.  Patient reports her appetite is fine, however, she does care for this hospital food.  Endorses an approximate 20 lb weight loss in the last month of unknown etiology.  Was eating well.  Declined addition of oral nutrition supplements.  Nutrition focused physical exam completed.  No muscle or subcutaneous fat depletion noticed.  Diet Order:  Diet Heart Room service appropriate?: Yes; Fluid consistency:: Thin  Skin:  Reviewed, no issues  Last BM:  N/A  Height:   Ht Readings from Last 1 Encounters:  07/02/15 5\' 3"  (1.6 m)    Weight:   Wt Readings from Last 1 Encounters:  07/02/15 270 lb 6.4 oz (122.653 kg)    Wt Readings from Last 10 Encounters:  07/02/15 270 lb 6.4 oz (122.653 kg)  05/16/14 260 lb (117.935 kg)  05/01/14 268 lb 11.2 oz (121.882 kg)  04/15/14 270 lb (122.471 kg)    Ideal Body Weight:  52.2 kg  BMI:  Body mass index is 47.91 kg/(m^2).  Estimated Nutritional Needs:   Kcal:  1900-2100  Protein:  90-100 gm  Fluid:  1.9-2.1 L  EDUCATION NEEDS:   No education needs identified at this time  04/17/14, RD, LDN Pager #: [phone] After-Hours Pager #: [phone]

## 2015-07-03 NOTE — H&P (Signed)
[facility] History and Physical  Judy Robles:[id number] DOB: [birth year] DOA: 07/02/2015  Referring physician: Patient was transferred from [facility]. PCP: Jearld Lesch, MD  Specialists: None.  Chief Complaint: Weakness.  HPI: Judy Robles is a 39 y.o. female with history of hypertension, chronic pain and anxiety was transferred from [facility] because of acute renal failure and possible need of dialysis. As per the patient, patient was recently treated for UTI last week with Cipro and all the last 2 days has been having multiple episodes of nausea and vomiting and diarrhea. Denies any abdominal pain chest pain or shortness of breath. Patient was feeling weak. Patient had gone to her dentist office today and over that patient was sent to ER because patient was feeling weak. In the ER patient was found to be hypotensive and labs reveal creatinine of around 5.9 with a baseline last year was normal. UA was showing negative for leukocyte esterase and nitrites were showing WBC and RBCs and squamous cells.. Urine drug screen is positive for methadone and benzodiazepine amphetamine and oxycodone. Chest x-ray was unremarkable and lactic as was mildly elevated at 2.3. Patient had received 4 L normalcy in bolus in the ER. At this time patient's blood pressure still in the 90 systolic. Patient is afebrile. Patient will be admitted for acute renal failure probably from dehydration.  Review of Systems: As presented in the history of presenting illness, rest negative.  Past Medical History  Diagnosis Date  . Obesity   . Hypertension   . Arthritis   . Sleep apnea   . MRSA (methicillin resistant Staphylococcus aureus)    Past Surgical History  Procedure Laterality Date  . I&d extremity Right 04/16/2014    Procedure: IRRIGATION AND DEBRIDEMENT EXTREMITY;  Surgeon: Sharma Covert, MD;  Location: [facility] OR;  Service: Orthopedics;  Laterality: Right;   Social History:   reports that she has been smoking Cigarettes.  She has been smoking about 0.25 packs per day. She does not have any smokeless tobacco history on file. She reports that she does not drink alcohol or use illicit drugs. Where does patient live home. Can patient participate in ADLs? Yes.  Allergies  Allergen Reactions  . Tylenol [Acetaminophen]     Family History:  Family History  Problem Relation Age of Onset  . Throat cancer Mother   . Hypertension Father   . Stroke Father       Prior to Admission medications   Medication Sig Start Date End Date Taking? Authorizing Provider  acetaminophen (TYLENOL) 325 MG tablet Take 650 mg by mouth every 6 (six) hours as needed for mild pain.    Historical Provider, MD  lisinopril (PRINIVIL,ZESTRIL) 20 MG tablet Take 1 tablet (20 mg total) by mouth daily. 04/19/14   Donavan Foil, MD  mupirocin cream (BACTROBAN) 2 % Apply 1 application topically 3 (three) times daily. 05/16/14   Oswaldo Conroy, PA-C  oxyCODONE-acetaminophen (PERCOCET) 10-325 MG per tablet Take 1 tablet by mouth every 6 (six) hours as needed for pain. 05/01/14   Yolanda Manges, DO  senna-docusate (SENOKOT-S) 8.6-50 MG per tablet Take 1 tablet by mouth at bedtime as needed for mild constipation. 04/19/14   Donavan Foil, MD    Physical Exam: Filed Vitals:   07/02/15 2100 07/02/15 2200  BP: 96/53 96/53  Pulse: 79 78  Temp: 98.8 F (37.1 C)   TempSrc: Oral   Resp:  18  Height: 5\' 3"  (1.6 m)  Weight: 122.653 kg (270 lb 6.4 oz)   SpO2: 100% 100%     General:  Moderately built and nourished.  Eyes: Anicteric no pallor.  ENT: No discharge from the ears eyes nose and mouth.  Neck: No mass felt. No JVD appreciated.  Cardiovascular: S1-S2 heard.  Respiratory: No rhonchi or crepitations.  Abdomen: Soft nontender bowel sounds present.  Skin: No rash.  Musculoskeletal: No edema.  Psychiatric: Appears normal.  Neurologic: Alert awake oriented to time place and person.  Moves all extremities.  Labs on Admission:  Basic Metabolic Panel:  Recent Labs Lab 07/02/15 2300  NA 131*  K 3.4*  CL 95*  CO2 27  GLUCOSE 96  BUN 35*  CREATININE 4.34*  CALCIUM 8.1*   Liver Function Tests:  Recent Labs Lab 07/02/15 2300  AST 33  ALT 23  ALKPHOS 44  BILITOT 0.4  PROT 6.4*  ALBUMIN 2.7*   No results for input(s): LIPASE, AMYLASE in the last 168 hours. No results for input(s): AMMONIA in the last 168 hours. CBC:  Recent Labs Lab 07/02/15 2300  WBC 10.3  NEUTROABS 7.1  HGB 10.7*  HCT 31.3*  MCV 93.4  PLT 234   Cardiac Enzymes: No results for input(s): CKTOTAL, CKMB, CKMBINDEX, TROPONINI in the last 168 hours.  BNP (last 3 results) No results for input(s): BNP in the last 8760 hours.  ProBNP (last 3 results) No results for input(s): PROBNP in the last 8760 hours.  CBG: No results for input(s): GLUCAP in the last 168 hours.  Radiological Exams on Admission: No results found.   Assessment/Plan Principal Problem:   Acute renal failure (ARF) (HCC) Active Problems:   Nausea vomiting and diarrhea   Hypotension   Chronic anemia   Chronic pain   ARF (acute renal failure) (HCC)   1. Acute renal failure nonoliguric - probably from renal cause probably from dehydration secondary to nausea vomiting and diarrhea and hypotension. I have ordered one more bolus of normal saline and we will continue with normal saline at 1 50 mL per hour. Closely follow intake output and metabolic panel. CT abdomen is pending to rule out any hydronephrosis. We will discontinue patient's lisinopril for now due to renal failure. 2. Nausea vomiting and diarrhea - probably secondary to gastroenteritis. Since patient also was recently on antibiotics he will check stool for C. difficile and GI pathogen panel. Abdomen appears benign on exam. 3. Hypotension - probably from dehydration and patient taking antihypertensives on being dehydrated. Patient does not look septic.  Continue with hydration. Recheck lactic acid level and also procalcitonin levels. UA is negative for leukocyte esterase and nitrites which were done in the ER at [facility]. Chest x-ray was unremarkable. Patient is afebrile. 4. Chronic pain - I have decrease patient's home dose of oxycodone 10 mg to 5 mg because of renal failure. 5. Anemia - follow CBC. If there is no significant for hemoglobin further workup as outpatient. 6. History of anxiety on Xanax. 7. Tobacco abuse - tobacco cessation counseling requested.  I have reviewed patient's old charts and labs. Patient's pregnancy screen is negative at [facility].   DVT Prophylaxis Lovenox.  Code Status: Full code.  Family Communication: Discussed with patient.  Disposition Plan: Admit to inpatient.    KAKRAKANDY,ARSHAD N. [facility] Pager [phone].  If 7PM-7AM, please contact night-coverage [email or web address] Password [email or web address] 07/03/2015, 1:39 AM

## 2015-07-03 NOTE — Progress Notes (Signed)
[facility] PROGRESS NOTE  Judy Robles QIW:[id number] DOB: 1976-04-17 DOA: 07/02/2015 PCP: Jearld Lesch, MD  Assessment/Plan: 1-Acute renal failure nonoliguric; in setting of hypotension, hypovolemia and ACE use.  Improving with IV fluids.  Continue to hold lisinopril.  Keep SBP above 100.  Continue with IV fluids.  CT negative for hydronephrosis. Cr has decreased to 3 from 4.  Pyuria; follow urine culture.   Nausea vomiting and diarrhea; Probably gastroenteritis.  CT abdomen negative.  C diff and GI pathogen pending.   Hypotension; secondary to hypovolemia. Improved. Continue with IV fluids.  Lactic acid and pro calcitonin negative.  Hold anti-hypertensive.   Chronic pain -patient's home dose of oxycodone 10 mg to 5 mg because of renal failure.  Anemia - follow CBC. If there is no significant for hemoglobin further workup as outpatient.  History of anxiety on Xanax.  Tobacco abuse - tobacco cessation counseling requested.    Code Status: Full code.  Family Communication: care discussed with patient  Disposition Plan: Remain inpatient    Consultants:  none  Procedures:  CT abdomen; negative for hydronephrosis.   Antibiotics:  none  HPI/Subjective: Feeling better, no more diarrhea. Denies dysuria.   Objective: Filed Vitals:   07/03/15 0700  BP: 124/59  Pulse:   Temp:   Resp: 29    Intake/Output Summary (Last 24 hours) at 07/03/15 0721 Last data filed at 07/03/15 0300  Gross per 24 hour  Intake      0 ml  Output   1000 ml  Net  -1000 ml   Filed Weights   07/02/15 2100  Weight: 122.653 kg (270 lb 6.4 oz)    Exam:   General:  Alert in no distress  Cardiovascular: S 1, S 2 RRR  Respiratory: CTA  Abdomen: BS present, soft, nt  Musculoskeletal: no edema  Data Reviewed: Basic Metabolic Panel:  Recent Labs Lab 07/02/15 2300 07/03/15 0330  NA 131* 134*  K 3.4* 3.6  CL 95* 97*  CO2 27 26  GLUCOSE 96 115*  BUN 35* 34*   CREATININE 4.34* 3.58*  CALCIUM 8.1* 8.4*   Liver Function Tests:  Recent Labs Lab 07/02/15 2300  AST 33  ALT 23  ALKPHOS 44  BILITOT 0.4  PROT 6.4*  ALBUMIN 2.7*   No results for input(s): LIPASE, AMYLASE in the last 168 hours. No results for input(s): AMMONIA in the last 168 hours. CBC:  Recent Labs Lab 07/02/15 2300 07/03/15 0330  WBC 10.3 10.0  NEUTROABS 7.1  --   HGB 10.7* 10.6*  HCT 31.3* 31.9*  MCV 93.4 91.9  PLT 234 251   Cardiac Enzymes:  Recent Labs Lab 07/03/15 0330  CKTOTAL 317*   BNP (last 3 results) No results for input(s): BNP in the last 8760 hours.  ProBNP (last 3 results) No results for input(s): PROBNP in the last 8760 hours.  CBG: No results for input(s): GLUCAP in the last 168 hours.  Recent Results (from the past 240 hour(s))  MRSA PCR Screening     Status: None   Collection Time: 07/02/15  9:46 PM  Result Value Ref Range Status   MRSA by PCR NEGATIVE NEGATIVE Final    Comment:        The GeneXpert MRSA Assay (FDA approved for NASAL specimens only), is one component of a comprehensive MRSA colonization surveillance program. It is not intended to diagnose MRSA infection nor to guide or monitor treatment for MRSA infections.      Studies: Ct  Abdomen Pelvis Wo Contrast  07/03/2015  CLINICAL DATA:  Acute renal failure. EXAM: CT ABDOMEN AND PELVIS WITHOUT CONTRAST TECHNIQUE: Multidetector CT imaging of the abdomen and pelvis was performed following the standard protocol without IV contrast. COMPARISON:  04/06/2011 FINDINGS: Lower chest: Pleural/ diaphragmatic nodularity on the right, unchanged from prior exam and considered benign. Tiny subpleural nodule in the left lower lobe, described on exam from 2014 and of doubtful clinical significance. The heart is normal in size. No pleural effusion. Liver: No focal lesion, prominence size.  Likely steatosis. Hepatobiliary: Gallbladder decompressed, no calcified stone. No biliary  dilatation. Pancreas: Normal. Spleen: Enlarged, measuring 13.5 x 12.5 x 8.0 cm for volume of 813. Coarse calcification anteriorly. Adrenal glands: No nodule. Kidneys: Symmetric renal size. No hydronephrosis. No perinephric stranding. No urolithiasis. Stomach/Bowel: Small hiatal hernia. Stomach physiologically distended. There are no dilated or thickened small bowel loops. Small volume of stool throughout the colon without colonic wall thickening. The appendix is normal. Vascular/Lymphatic: No retroperitoneal adenopathy. Abdominal aorta is normal in caliber. Reproductive: Uterus is unremarkable. Ovaries symmetric in size. No adnexal mass. Bladder: Decompressed by Foley catheter not well evaluated. Other: No free air, free fluid, or intra-abdominal fluid collection. Tiny fat containing umbilical hernia. Musculoskeletal: There are no acute or suspicious osseous abnormalities. Facet arthropathy at L5-S1. Mild degenerative change at the sacroiliac joints. IMPRESSION: 1. No hydronephrosis or obstructive uropathy. Normal CT appearance of the kidneys. 2. Hepatosplenomegaly and hepatic steatosis. Electronically Signed   By: Rubye Oaks M.D.   On: 07/03/2015 02:35    Scheduled Meds: . enoxaparin (LOVENOX) injection  30 mg Subcutaneous Q24H  . [START ON 07/04/2015] Influenza vac split quadrivalent PF  0.5 mL Intramuscular Tomorrow-1000   Continuous Infusions:   Principal Problem:   Acute renal failure (ARF) (HCC) Active Problems:   Nausea vomiting and diarrhea   Hypotension   Chronic anemia   Chronic pain   ARF (acute renal failure) (HCC)    Time spent: 35 minutes.      Hartley Barefoot A  [facility] Pager [phone]. If 7PM-7AM, please contact night-coverage at [email or web address], password [facility] 07/03/2015, 7:21 AM  LOS: 1 day

## 2015-07-03 NOTE — Progress Notes (Signed)
Utilization review completed. Leighann Amadon, RN, BSN. 

## 2015-07-03 NOTE — Progress Notes (Signed)
Unable to collect stool specimen.

## 2015-07-04 DIAGNOSIS — N170 Acute kidney failure with tubular necrosis: Secondary | ICD-10-CM

## 2015-07-04 LAB — BASIC METABOLIC PANEL
ANION GAP: 6 (ref 5–15)
BUN: 22 mg/dL — ABNORMAL HIGH (ref 6–20)
CO2: 25 mmol/L (ref 22–32)
Calcium: 8.2 mg/dL — ABNORMAL LOW (ref 8.9–10.3)
Chloride: 106 mmol/L (ref 101–111)
Creatinine, Ser: 1.4 mg/dL — ABNORMAL HIGH (ref 0.44–1.00)
GFR calc Af Amer: 53 mL/min — ABNORMAL LOW (ref >60)
GFR, EST NON AFRICAN AMERICAN: 46 mL/min — AB (ref >60)
GLUCOSE: 90 mg/dL (ref 65–99)
POTASSIUM: 3.6 mmol/L (ref 3.5–5.1)
Sodium: 137 mmol/L (ref 135–145)

## 2015-07-04 LAB — URINE CULTURE: CULTURE: NO GROWTH

## 2015-07-04 MED ORDER — AMLODIPINE BESYLATE 2.5 MG PO TABS: 2.5000 mg | ORAL_TABLET | Freq: Every day | ORAL | Status: DC

## 2015-07-04 NOTE — Progress Notes (Signed)
Pt discharged at 10:30, ambulated with NA to meet ride. Pt discharged with Rx, signed AVS, charger and cellphone. Pt has no questions regarding her care.

## 2015-07-04 NOTE — Discharge Summary (Signed)
Physician Discharge Summary  Judy Robles SWN:[id number] DOB: 08/21/1976 DOA: 07/02/2015  PCP: Jearld Lesch, MD  Admit date: 07/02/2015 Discharge date: 07/05/2015  Time spent: 35 minutes  Recommendations for Outpatient Follow-up:  Needs evaluation for anemia.  Needs repeat renal; function test   Discharge Diagnoses:    Acute renal failure (ARF) (HCC)   Nausea vomiting and diarrhea   Hypotension   Chronic anemia   Chronic pain   ARF (acute renal failure) (HCC)   Discharge Condition: stable.   Diet recommendation: renal diet   Filed Weights   07/02/15 2100  Weight: 122.653 kg (270 lb 6.4 oz)    History of present illness:  Judy Robles is a 39 y.o. female with history of hypertension, chronic pain and anxiety was transferred from [facility] because of acute renal failure and possible need of dialysis. As per the patient, patient was recently treated for UTI last week with Cipro and all the last 2 days has been having multiple episodes of nausea and vomiting and diarrhea. Denies any abdominal pain chest pain or shortness of breath. Patient was feeling weak. Patient had gone to her dentist office today and over that patient was sent to ER because patient was feeling weak. In the ER patient was found to be hypotensive and labs reveal creatinine of around 5.9 with a baseline last year was normal. UA was showing negative for leukocyte esterase and nitrites were showing WBC and RBCs and squamous cells.. Urine drug screen is positive for methadone and benzodiazepine amphetamine and oxycodone. Chest x-ray was unremarkable and lactic as was mildly elevated at 2.3. Patient had received 4 L normalcy in bolus in the ER. At this time patient's blood pressure still in the 90 systolic. Patient is afebrile. Patient will be admitted for acute renal failure probably from dehydration.  Hospital Course:  1-Acute renal failure nonoliguric; in setting of hypotension, hypovolemia and ACE  use.  Improving with IV fluids.  Continue to hold lisinopril at discharge .  Keep SBP above 100.  Treated with IV fluids.  CT negative for hydronephrosis. Cr has decreased to 1.4 from 4 on admission.  Pyuria; urine culture no growth.   Nausea vomiting and diarrhea; Probably gastroenteritis.  CT abdomen negative.  C diff and GI pathogen not send for test, diarrhea resolved on admission.   Hypotension; secondary to hypovolemia.  Resolved with IV fluids.  Lactic acid and pro calcitonin negative. resolved.   HTN; hold lisinopril at discharge due to renal failure. BP increasing, will discharge patient on low dose Norvasc.   Chronic pain -patient's home dose of oxycodone 10 mg to 5 mg because of renal failure.  Anemia - follow CBC. further workup as outpatient.  History of anxiety on Xanax.  Tobacco abuse - tobacco cessation counseling requested.  Procedures:  none  Consultations:  none  Discharge Exam: Filed Vitals:   07/04/15 0900  BP: 135/85  Pulse: 98  Temp: 98.1 F (36.7 C)  Resp: 20    General: Alert in no distress Cardiovascular: S 1, S 2 RRR Respiratory: CTA  Discharge Instructions   Discharge Instructions    Diet - low sodium heart healthy    Complete by:  As directed      Increase activity slowly    Complete by:  As directed           Discharge Medication List as of 07/04/2015  9:31 AM    CONTINUE these medications which have CHANGED   Details  amLODipine (NORVASC) 2.5 MG tablet Take 1 tablet (2.5 mg total) by mouth daily., Starting 07/04/2015, Until Discontinued, Print      CONTINUE these medications which have NOT CHANGED   Details  ALPRAZolam (XANAX) 1 MG tablet Take 1 mg by mouth at bedtime as needed for sleep., Until Discontinued, Historical Med      STOP taking these medications     lisinopril (PRINIVIL,ZESTRIL) 20 MG tablet        Allergies  Allergen Reactions  . Tylenol [Acetaminophen]    Follow-up Information     Follow up with Jearld Lesch, MD In 1 week.   Specialty:  Family Medicine   Contact information:   [address] [place] [address] [phone]        The results of significant diagnostics from this hospitalization (including imaging, microbiology, ancillary and laboratory) are listed below for reference.    Significant Diagnostic Studies: Ct Abdomen Pelvis Wo Contrast  07/03/2015  CLINICAL DATA:  Acute renal failure. EXAM: CT ABDOMEN AND PELVIS WITHOUT CONTRAST TECHNIQUE: Multidetector CT imaging of the abdomen and pelvis was performed following the standard protocol without IV contrast. COMPARISON:  04/06/2011 FINDINGS: Lower chest: Pleural/ diaphragmatic nodularity on the right, unchanged from prior exam and considered benign. Tiny subpleural nodule in the left lower lobe, described on exam from 2014 and of doubtful clinical significance. The heart is normal in size. No pleural effusion. Liver: No focal lesion, prominence size.  Likely steatosis. Hepatobiliary: Gallbladder decompressed, no calcified stone. No biliary dilatation. Pancreas: Normal. Spleen: Enlarged, measuring 13.5 x 12.5 x 8.0 cm for volume of 813. Coarse calcification anteriorly. Adrenal glands: No nodule. Kidneys: Symmetric renal size. No hydronephrosis. No perinephric stranding. No urolithiasis. Stomach/Bowel: Small hiatal hernia. Stomach physiologically distended. There are no dilated or thickened small bowel loops. Small volume of stool throughout the colon without colonic wall thickening. The appendix is normal. Vascular/Lymphatic: No retroperitoneal adenopathy. Abdominal aorta is normal in caliber. Reproductive: Uterus is unremarkable. Ovaries symmetric in size. No adnexal mass. Bladder: Decompressed by Foley catheter not well evaluated. Other: No free air, free fluid, or intra-abdominal fluid collection. Tiny fat containing umbilical hernia. Musculoskeletal: There are no acute or suspicious osseous  abnormalities. Facet arthropathy at L5-S1. Mild degenerative change at the sacroiliac joints. IMPRESSION: 1. No hydronephrosis or obstructive uropathy. Normal CT appearance of the kidneys. 2. Hepatosplenomegaly and hepatic steatosis. Electronically Signed   By: Rubye Oaks M.D.   On: 07/03/2015 02:35    Microbiology: Recent Results (from the past 240 hour(s))  MRSA PCR Screening     Status: None   Collection Time: 07/02/15  9:46 PM  Result Value Ref Range Status   MRSA by PCR NEGATIVE NEGATIVE Final    Comment:        The GeneXpert MRSA Assay (FDA approved for NASAL specimens only), is one component of a comprehensive MRSA colonization surveillance program. It is not intended to diagnose MRSA infection nor to guide or monitor treatment for MRSA infections.   Culture, Urine     Status: None   Collection Time: 07/03/15  6:18 AM  Result Value Ref Range Status   Specimen Description URINE, CATHETERIZED  Final   Special Requests NONE  Final   Culture NO GROWTH 1 DAY  Final   Report Status 07/04/2015 FINAL  Final     Labs: Basic Metabolic Panel:  Recent Labs Lab 07/02/15 2300 07/03/15 0330 07/04/15 0350  NA 131* 134* 137  K 3.4* 3.6 3.6  CL  95* 97* 106  CO2 27 26 25   GLUCOSE 96 115* 90  BUN 35* 34* 22*  CREATININE 4.34* 3.58* 1.40*  CALCIUM 8.1* 8.4* 8.2*   Liver Function Tests:  Recent Labs Lab 07/02/15 2300  AST 33  ALT 23  ALKPHOS 44  BILITOT 0.4  PROT 6.4*  ALBUMIN 2.7*   No results for input(s): LIPASE, AMYLASE in the last 168 hours. No results for input(s): AMMONIA in the last 168 hours. CBC:  Recent Labs Lab 07/02/15 2300 07/03/15 0330  WBC 10.3 10.0  NEUTROABS 7.1  --   HGB 10.7* 10.6*  HCT 31.3* 31.9*  MCV 93.4 91.9  PLT 234 251   Cardiac Enzymes:  Recent Labs Lab 07/03/15 0330  CKTOTAL 317*   BNP: BNP (last 3 results) No results for input(s): BNP in the last 8760 hours.  ProBNP (last 3 results) No results for input(s):  PROBNP in the last 8760 hours.  CBG: No results for input(s): GLUCAP in the last 168 hours.     Signed:  13/11/16 A  [facility] 07/05/2015, 3:11 PM

## 2016-07-21 ENCOUNTER — Encounter (HOSPITAL_COMMUNITY): Payer: Self-pay

## 2016-07-21 ENCOUNTER — Inpatient Hospital Stay (HOSPITAL_COMMUNITY)
Admission: AD | Admit: 2016-07-21 | Discharge: 2016-07-27 | DRG: 897 | Disposition: A | Discharge status: Home / Self Care | Payer: Medicaid Other | Source: Intra-hospital | Attending: Psychiatry | Admitting: Psychiatry

## 2016-07-21 DIAGNOSIS — F332 Major depressive disorder, recurrent severe without psychotic features: Secondary | ICD-10-CM | POA: Diagnosis present

## 2016-07-21 DIAGNOSIS — Z79899 Other long term (current) drug therapy: Secondary | ICD-10-CM | POA: Diagnosis not present

## 2016-07-21 DIAGNOSIS — Z801 Family history of malignant neoplasm of trachea, bronchus and lung: Secondary | ICD-10-CM | POA: Diagnosis not present

## 2016-07-21 DIAGNOSIS — R45851 Suicidal ideations: Secondary | ICD-10-CM | POA: Diagnosis present

## 2016-07-21 DIAGNOSIS — F1120 Opioid dependence, uncomplicated: Secondary | ICD-10-CM | POA: Diagnosis present

## 2016-07-21 DIAGNOSIS — F1920 Other psychoactive substance dependence, uncomplicated: Secondary | ICD-10-CM | POA: Diagnosis present

## 2016-07-21 DIAGNOSIS — N189 Chronic kidney disease, unspecified: Secondary | ICD-10-CM | POA: Diagnosis present

## 2016-07-21 DIAGNOSIS — Z9114 Patient's other noncompliance with medication regimen: Secondary | ICD-10-CM | POA: Diagnosis not present

## 2016-07-21 DIAGNOSIS — Z888 Allergy status to other drugs, medicaments and biological substances status: Secondary | ICD-10-CM | POA: Diagnosis not present

## 2016-07-21 DIAGNOSIS — F11229 Opioid dependence with intoxication, unspecified: Principal | ICD-10-CM | POA: Diagnosis present

## 2016-07-21 DIAGNOSIS — F17210 Nicotine dependence, cigarettes, uncomplicated: Secondary | ICD-10-CM | POA: Diagnosis present

## 2016-07-21 DIAGNOSIS — Z8249 Family history of ischemic heart disease and other diseases of the circulatory system: Secondary | ICD-10-CM | POA: Diagnosis not present

## 2016-07-21 DIAGNOSIS — I129 Hypertensive chronic kidney disease with stage 1 through stage 4 chronic kidney disease, or unspecified chronic kidney disease: Secondary | ICD-10-CM | POA: Diagnosis present

## 2016-07-21 DIAGNOSIS — Z823 Family history of stroke: Secondary | ICD-10-CM | POA: Diagnosis not present

## 2016-07-21 DIAGNOSIS — I10 Essential (primary) hypertension: Secondary | ICD-10-CM | POA: Diagnosis present

## 2016-07-21 MED ORDER — LORAZEPAM 1 MG PO TABS
1.0000 mg | ORAL_TABLET | Freq: Two times a day (BID) | ORAL | Status: AC
  Administered 2016-07-23: 1 mg via ORAL

## 2016-07-21 MED ORDER — LORAZEPAM 1 MG PO TABS
ORAL_TABLET | ORAL | Status: AC
  Filled 2016-07-21: qty 1

## 2016-07-21 MED ORDER — NAPROXEN 500 MG PO TABS: 500.0000 mg | ORAL_TABLET | Freq: Two times a day (BID) | ORAL | Status: DC | PRN

## 2016-07-21 MED ORDER — METHOCARBAMOL 500 MG PO TABS
500.0000 mg | ORAL_TABLET | Freq: Three times a day (TID) | ORAL | Status: AC | PRN
  Administered 2016-07-23 – 2016-07-25 (×6): 500 mg via ORAL
  Filled 2016-07-21: qty 1

## 2016-07-21 MED ORDER — LORAZEPAM 1 MG PO TABS
1.0000 mg | ORAL_TABLET | Freq: Every day | ORAL | Status: AC
  Administered 2016-07-25: 1 mg via ORAL

## 2016-07-21 MED ORDER — LOPERAMIDE HCL 2 MG PO CAPS: 2.0000 mg | ORAL_CAPSULE | ORAL | Status: AC | PRN

## 2016-07-21 MED ORDER — PNEUMOCOCCAL VAC POLYVALENT 25 MCG/0.5ML IJ INJ
0.5000 mL | INJECTION | INTRAMUSCULAR | Status: AC
  Administered 2016-07-22: 0.5 mL via INTRAMUSCULAR

## 2016-07-21 MED ORDER — DICYCLOMINE HCL 20 MG PO TABS: 20.0000 mg | ORAL_TABLET | Freq: Four times a day (QID) | ORAL | Status: AC | PRN

## 2016-07-21 MED ORDER — ONDANSETRON 4 MG PO TBDP: 4.0000 mg | ORAL_TABLET | Freq: Four times a day (QID) | ORAL | Status: AC | PRN

## 2016-07-21 MED ORDER — MAGNESIUM HYDROXIDE 400 MG/5ML PO SUSP: 30.0000 mL | Freq: Every day | ORAL | Status: DC | PRN

## 2016-07-21 MED ORDER — VITAMIN B-1 100 MG PO TABS
100.0000 mg | ORAL_TABLET | Freq: Every day | ORAL | Status: DC
  Administered 2016-07-22 – 2016-07-27 (×6): 100 mg via ORAL
  Filled 2016-07-21 (×8): qty 1

## 2016-07-21 MED ORDER — ADULT MULTIVITAMIN W/MINERALS CH
1.0000 | ORAL_TABLET | Freq: Every day | ORAL | Status: DC
  Administered 2016-07-22 – 2016-07-27 (×6): 1 via ORAL
  Filled 2016-07-21 (×9): qty 1

## 2016-07-21 MED ORDER — LORAZEPAM 1 MG PO TABS: 1.0000 mg | ORAL_TABLET | Freq: Four times a day (QID) | ORAL | Status: AC | PRN

## 2016-07-21 MED ORDER — CLONIDINE HCL 0.1 MG PO TABS
0.1000 mg | ORAL_TABLET | Freq: Every day | ORAL | Status: DC
  Administered 2016-07-26: 0.1 mg via ORAL
  Filled 2016-07-21 (×2): qty 1

## 2016-07-21 MED ORDER — CLONIDINE HCL 0.1 MG PO TABS
0.1000 mg | ORAL_TABLET | ORAL | Status: AC
  Administered 2016-07-23 – 2016-07-25 (×2): 0.1 mg via ORAL
  Filled 2016-07-21 (×4): qty 1

## 2016-07-21 MED ORDER — INFLUENZA VAC SPLIT QUAD 0.5 ML IM SUSY
0.5000 mL | PREFILLED_SYRINGE | INTRAMUSCULAR | Status: AC
  Administered 2016-07-22: 0.5 mL via INTRAMUSCULAR
  Filled 2016-07-21: qty 0.5

## 2016-07-21 MED ORDER — LORAZEPAM 1 MG PO TABS
1.0000 mg | ORAL_TABLET | Freq: Three times a day (TID) | ORAL | Status: AC
  Administered 2016-07-22 – 2016-07-23 (×3): 1 mg via ORAL

## 2016-07-21 MED ORDER — CLONIDINE HCL 0.1 MG PO TABS
0.1000 mg | ORAL_TABLET | Freq: Four times a day (QID) | ORAL | Status: AC
  Administered 2016-07-22 – 2016-07-23 (×4): 0.1 mg via ORAL
  Filled 2016-07-21 (×8): qty 1

## 2016-07-21 MED ORDER — HYDROXYZINE HCL 25 MG PO TABS
25.0000 mg | ORAL_TABLET | Freq: Four times a day (QID) | ORAL | Status: DC | PRN
  Administered 2016-07-23 – 2016-07-27 (×7): 25 mg via ORAL
  Filled 2016-07-21 (×7): qty 1

## 2016-07-21 MED ORDER — LORAZEPAM 1 MG PO TABS
1.0000 mg | ORAL_TABLET | Freq: Four times a day (QID) | ORAL | Status: AC
  Administered 2016-07-21: 1 mg via ORAL

## 2016-07-21 MED ORDER — ALUM & MAG HYDROXIDE-SIMETH 200-200-20 MG/5ML PO SUSP: 30.0000 mL | ORAL | Status: DC | PRN

## 2016-07-21 MED ORDER — TRAZODONE HCL 50 MG PO TABS
50.0000 mg | ORAL_TABLET | Freq: Every evening | ORAL | Status: DC | PRN
  Administered 2016-07-21 – 2016-07-26 (×6): 50 mg via ORAL
  Filled 2016-07-21 (×6): qty 1
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
## 2016-07-21 NOTE — Progress Notes (Signed)
Judy Robles is a 40 year old being admitted voluntarily to [facility] from [facility] ED.  She presented to the ED to get help for her addiction and increasing depression.  She did reported some suicidal ideation but no specific plan or intent.  She denied HI or A/V hallucinations.  She did report she feels hopeless, helpless and worthless at times.  Sleep and appetite have been affected and reported that they go up and down at times.  She reported that she is currently homeless and isn't sure where she is going to go when she is discharged.  She denied having a problem with addiction due to having chronic pain in her right ankle (due to fall in July requiring surgery).  "I have to have another surgery because the screws that they put in there are coming out."  Informed her that she will need to talk with the doctor about the medication and that she is on an opiate detox protocol.  She denied drinking alcohol on a regular basis and did report that she was taking xanax as well from her medical doctor.  She denied abusing her oxycodone or her xanax.  Oriented her to the unit.   Admission paperwork completed and signed.  Belongings searched and secured in locker 13.  We will continue to monitor the progress towards her goals.

## 2016-07-21 NOTE — Tx Team (Addendum)
Initial Treatment Plan 07/21/2016 11:32 PM Judy Robles [id number]    PATIENT STRESSORS: Financial difficulties Health problems Marital or family conflict   PATIENT STRENGTHS: Ability for insight Average or above average intelligence Capable of independent living Communication skills   PATIENT IDENTIFIED PROBLEMS: "I am concerned about abusing drugs the way I am"  "i want to be able to think positively and believe in myself again"  Substance abuse  depression               DISCHARGE CRITERIA:  Ability to meet basic life and health needs Adequate post-discharge living arrangements Improved stabilization in mood, thinking, and/or behavior  PRELIMINARY DISCHARGE PLAN: Attend aftercare/continuing care group Outpatient therapy Placement in alternative living arrangements  PATIENT/FAMILY INVOLVEMENT: This treatment plan has been presented to and reviewed with the patient, Judy Robles.  The patient and family have been given the opportunity to ask questions and make suggestions.  Jonetta Speak, RN 07/21/2016, 11:32 PM

## 2016-07-22 DIAGNOSIS — F11229 Opioid dependence with intoxication, unspecified: Principal | ICD-10-CM

## 2016-07-22 DIAGNOSIS — Z79899 Other long term (current) drug therapy: Secondary | ICD-10-CM

## 2016-07-22 DIAGNOSIS — Z823 Family history of stroke: Secondary | ICD-10-CM

## 2016-07-22 DIAGNOSIS — Z801 Family history of malignant neoplasm of trachea, bronchus and lung: Secondary | ICD-10-CM

## 2016-07-22 DIAGNOSIS — N189 Chronic kidney disease, unspecified: Secondary | ICD-10-CM

## 2016-07-22 DIAGNOSIS — Z8249 Family history of ischemic heart disease and other diseases of the circulatory system: Secondary | ICD-10-CM

## 2016-07-22 DIAGNOSIS — F332 Major depressive disorder, recurrent severe without psychotic features: Secondary | ICD-10-CM

## 2016-07-22 DIAGNOSIS — Z888 Allergy status to other drugs, medicaments and biological substances status: Secondary | ICD-10-CM

## 2016-07-22 MED ORDER — LORAZEPAM 1 MG PO TABS
ORAL_TABLET | ORAL | Status: AC
  Administered 2016-07-22: 1 mg
  Filled 2016-07-22: qty 1

## 2016-07-22 MED ORDER — LORAZEPAM 1 MG PO TABS
ORAL_TABLET | ORAL | Status: AC
  Filled 2016-07-22: qty 1

## 2016-07-22 MED ORDER — GABAPENTIN 800 MG PO TABS
800.0000 mg | ORAL_TABLET | Freq: Three times a day (TID) | ORAL | Status: DC
  Administered 2016-07-22 – 2016-07-27 (×17): 800 mg via ORAL
  Filled 2016-07-22 (×24): qty 1

## 2016-07-22 MED ORDER — VENLAFAXINE HCL ER 75 MG PO CP24
75.0000 mg | ORAL_CAPSULE | Freq: Every day | ORAL | Status: DC
  Administered 2016-07-22 – 2016-07-27 (×6): 75 mg via ORAL
  Filled 2016-07-22 (×10): qty 1

## 2016-07-22 MED ORDER — IBUPROFEN 600 MG PO TABS
600.0000 mg | ORAL_TABLET | Freq: Four times a day (QID) | ORAL | Status: DC | PRN
  Administered 2016-07-23 – 2016-07-26 (×8): 600 mg via ORAL
  Filled 2016-07-22 (×8): qty 1

## 2016-07-22 MED ORDER — AMLODIPINE BESYLATE 5 MG PO TABS
5.0000 mg | ORAL_TABLET | Freq: Every day | ORAL | Status: DC
  Administered 2016-07-22 – 2016-07-25 (×3): 5 mg via ORAL
  Filled 2016-07-22 (×6): qty 1

## 2016-07-22 NOTE — Progress Notes (Signed)
Patient presents with flat affect and depressed but pleasant behavior during admission interview and assessment. VS monitored and recorded. Skin check performed with Herbert Seta RN and revealed right forearm had a large scar from a spider bite and MRSA 6 years ago. Contraband was not found. Patient was oriented to unit and schedule. Pt states "I realize I need to be here". Pt denies SI/HI/AVH at this time. PO fluids provided. Safety maintained. Rest encouraged.

## 2016-07-22 NOTE — [facility] Suicide Risk Assessment (Signed)
[facility] INPATIENT:  Family/Significant Other Suicide Prevention Education  Suicide Prevention Education:  Patient Refusal for Family/Significant Other Suicide Prevention Education: The patient Judy Robles has refused to provide written consent for family/significant other to be provided Family/Significant Other Suicide Prevention Education during admission and/or prior to discharge.  Physician notified.  SPE completed with pt, as pt refused to consent to family contact. SPI pamphlet provided to pt and pt was encouraged to share information with support network, ask questions, and talk about any concerns relating to SPE. Pt denies access to guns/firearms and verbalized understanding of information provided. Mobile Crisis information also provided to pt.   [name] N Smart LCSW 07/22/2016, 11:04 AM

## 2016-07-22 NOTE — [facility] Suicide Risk Assessment (Signed)
[facility] Admission Suicide Risk Assessment   Nursing information obtained from:    Demographic factors:    Current Mental Status:    Loss Factors:    Historical Factors:    Risk Reduction Factors:     Total Time spent with patient: 30 minutes Principal Problem: Polysubstance dependence including opioid type drug with complication, episodic abuse (HCC) Diagnosis:   Patient Active Problem List   Diagnosis Date Noted  . Chronic renal insufficiency [N18.9] 07/22/2016  . Polysubstance dependence including opioid type drug with complication, episodic abuse (HCC) [P54.656] 07/21/2016  . MDD (major depressive disorder), recurrent severe, without psychosis (HCC) [F33.2] 07/21/2016  . Chronic anemia [D64.9] 07/03/2015  . Chronic pain [G89.29] 07/03/2015  . Diarrhea [R19.7] 05/01/2014  . Essential hypertension, benign [I10] 04/17/2014   Subjective Data: Patient denies current suicidal or homicidal ideation, plan or intent.  Continued Clinical Symptoms:  Alcohol Use Disorder Identification Test Final Score (AUDIT): 0 The "Alcohol Use Disorders Identification Test", Guidelines for Use in Primary Care, Second Edition.  World Science writer [facility]). Score between 0-7:  no or low risk or alcohol related problems. Score between 8-15:  moderate risk of alcohol related problems. Score between 16-19:  high risk of alcohol related problems. Score 20 or above:  warrants further diagnostic evaluation for alcohol dependence and treatment.   CLINICAL FACTORS:   Alcohol/Substance Abuse/Dependencies Previous Psychiatric Diagnoses and Treatments Medical Diagnoses and Treatments/Surgeries   Musculoskeletal: Strength & Muscle Tone: within normal limits Gait & Station: normal Patient leans: N/A  Psychiatric Specialty Exam: Physical Exam  ROS  Blood pressure (!) 157/78, pulse 87, temperature 98.3 F (36.8 C), temperature source Oral, resp. rate 16, height 5\' 3"  (1.6 m), weight 110.2 kg (243 lb), SpO2  100 %.Body mass index is 43.05 kg/m.   General Appearance: Casual  Eye Contact:  Good  Speech:  Clear and Coherent  Volume:  Normal  Mood:  Anxious  Affect:  Congruent  Thought Process:  Coherent  Orientation:  Negative  Thought Content:  Negative  Suicidal Thoughts:  No  Homicidal Thoughts:  No  Memory:  Negative  Judgement:  Fair  Insight:  Fair  Psychomotor Activity:  Normal  Concentration:  Concentration: Fair  Recall:  of Knowledge:  Fair  Language:  Good  Akathisia:  No  Handed:  Right  AIMS (if indicated):     Assets:  Resilience  ADL's:  Intact  Cognition:  WNL  Sleep:  Number of Hours: 6     COGNITIVE FEATURES THAT CONTRIBUTE TO RISK:  None    SUICIDE RISK:   Mild:  Suicidal ideation of limited frequency, intensity, duration, and specificity.  There are no identifiable plans, no associated intent, mild dysphoria and related symptoms, good self-control (both objective and subjective assessment), few other risk factors, and identifiable protective factors, including available and accessible social support.   PLAN OF CARE: see PAA  I certify that inpatient services furnished can reasonably be expected to improve the patient's condition.  [employer], MD 07/22/2016, 10:21 AM

## 2016-07-22 NOTE — Progress Notes (Signed)
CSW met with pt individually to discuss aftercare plan. Pt reports that currently she has nowhere to live at discharge and no income. Pt had been intermittently staying with a friend prior to her admission, but reports that she cannot return there. She reports no family supports. Pt is interested in [facility] and [facility] for possible inpatient treatment. CSW made pt aware that she cannot take narcotic pain medications at either facility. She was given [facility] packet and encouraged to call Cecille Rubin in admissions to complete phone assessment. Pt verbalized understanding of above and went to lye down, reporting that she was tired. Referral made to [facility] this afternoon 07/22/2016 1:43 PM. Pt provided with [facility] list and shelter list as well.   Maxie Better, MSW, LCSW Clinical Social Worker 07/22/2016 1:43 PM

## 2016-07-22 NOTE — Progress Notes (Signed)
Recreation Therapy Notes  Date: 07/22/16 Time: 0930 Location: 300 Hall Dayroom  Group Topic: Stress Management  Goal Area(s) Addresses:  Patient will verbalize importance of using healthy stress management.  Patient will identify positive emotions associated with healthy stress management.   Intervention: Stress Management  Activity :  Guided Imagery.  LRT introduced the stress management technique of guided imagery.  LRT read a script so patients could experience the concept of guided imagery.  Patients were to follow along as LRT read script to engage in activity.  Education:  Stress Management, Discharge Planning.   Education Outcome: Acknowledges edcuation/In group clarification offered/Needs additional education  Clinical Observations/Feedback: Pt did not attend group.   Caroll Rancher, LRT/CTRS         Caroll Rancher A 07/22/2016 11:52 AM

## 2016-07-22 NOTE — Tx Team (Signed)
Interdisciplinary Treatment and Diagnostic Plan Update  07/22/2016 Time of Session: 9:30AM Judy Robles MRN: [id number]  Principal Diagnosis: Polysubstance dependence including opioid type drug with complication, episodic abuse (HCC)  Secondary Diagnoses: Principal Problem:   Polysubstance dependence including opioid type drug with complication, episodic abuse (HCC) Active Problems:   MDD (major depressive disorder), recurrent severe, without psychosis (HCC)   Polysubstance dependence including opioid type drug without complication, episodic abuse (HCC)   Current Medications:  Current Facility-Administered Medications  Medication Dose Route Frequency Provider Last Rate Last Dose  . alum & mag hydroxide-simeth (MAALOX/MYLANTA) 200-200-20 MG/5ML suspension 30 mL  30 mL Oral Q4H PRN Laveda Abbe, NP      . cloNIDine (CATAPRES) tablet 0.1 mg  0.1 mg Oral QID Laveda Abbe, NP   0.1 mg at 07/22/16 1025   Followed by  . [START ON 07/23/2016] cloNIDine (CATAPRES) tablet 0.1 mg  0.1 mg Oral BH-qamhs Laveda Abbe, NP       Followed by  . [START ON 07/26/2016] cloNIDine (CATAPRES) tablet 0.1 mg  0.1 mg Oral QAC breakfast Laveda Abbe, NP      . dicyclomine (BENTYL) tablet 20 mg  20 mg Oral Q6H PRN Laveda Abbe, NP      . gabapentin (NEURONTIN) tablet 800 mg  800 mg Oral TID Acquanetta Sit, MD      . hydrOXYzine (ATARAX/VISTARIL) tablet 25 mg  25 mg Oral Q6H PRN Laveda Abbe, NP      . Influenza vac split quadrivalent PF (FLUARIX) injection 0.5 mL  0.5 mL Intramuscular Tomorrow-1000 Acquanetta Sit, MD      . loperamide (IMODIUM) capsule 2-4 mg  2-4 mg Oral PRN Laveda Abbe, NP      . LORazepam (ATIVAN) 1 MG tablet           . LORazepam (ATIVAN) tablet 1 mg  1 mg Oral Q6H PRN Laveda Abbe, NP      . LORazepam (ATIVAN) tablet 1 mg  1 mg Oral QID Laveda Abbe, NP   1 mg at 07/21/16 2310   Followed by  . LORazepam  (ATIVAN) tablet 1 mg  1 mg Oral TID Laveda Abbe, NP       Followed by  . [START ON 07/23/2016] LORazepam (ATIVAN) tablet 1 mg  1 mg Oral BID Laveda Abbe, NP       Followed by  . [START ON 07/25/2016] LORazepam (ATIVAN) tablet 1 mg  1 mg Oral Daily Laveda Abbe, NP      . magnesium hydroxide (MILK OF MAGNESIA) suspension 30 mL  30 mL Oral Daily PRN Laveda Abbe, NP      . methocarbamol (ROBAXIN) tablet 500 mg  500 mg Oral Q8H PRN Laveda Abbe, NP      . multivitamin with minerals tablet 1 tablet  1 tablet Oral Daily Laveda Abbe, NP   1 tablet at 07/22/16 0810  . naproxen (NAPROSYN) tablet 500 mg  500 mg Oral BID PRN Laveda Abbe, NP      . ondansetron (ZOFRAN-ODT) disintegrating tablet 4 mg  4 mg Oral Q6H PRN Laveda Abbe, NP      . pneumococcal 23 valent vaccine (PNU-IMMUNE) injection 0.5 mL  0.5 mL Intramuscular Tomorrow-1000 Acquanetta Sit, MD      . thiamine (VITAMIN B-1) tablet 100 mg  100 mg Oral Daily Laveda Abbe, NP   100 mg at 07/22/16  0800  . traZODone (DESYREL) tablet 50 mg  50 mg Oral QHS PRN Laveda Abbe, NP   50 mg at 07/21/16 2310  . venlafaxine XR (EFFEXOR-XR) 24 hr capsule 75 mg  75 mg Oral Q breakfast Acquanetta Sit, MD       PTA Medications: Prescriptions Prior to Admission  Medication Sig Dispense Refill Last Dose  . ALPRAZolam (XANAX) 1 MG tablet Take 1 mg by mouth at bedtime as needed for sleep.   Past Week at Unknown time  . amLODipine (NORVASC) 2.5 MG tablet Take 1 tablet (2.5 mg total) by mouth daily. 30 tablet 0     Patient Stressors: Financial difficulties Health problems Marital or family conflict  Patient Strengths: Ability for insight Average or above average intelligence Capable of independent living Communication skills  Treatment Modalities: Medication Management, Group therapy, Case management,  1 to 1 session with clinician, Psychoeducation, Recreational  therapy.   Physician Treatment Plan for Primary Diagnosis: Polysubstance dependence including opioid type drug with complication, episodic abuse (HCC) Long Term Goal(s):     Short Term Goals:    Medication Management: Evaluate patient's response, side effects, and tolerance of medication regimen.  Therapeutic Interventions: 1 to 1 sessions, Unit Group sessions and Medication administration.  Evaluation of Outcomes: Progressing  Physician Treatment Plan for Secondary Diagnosis: Principal Problem:   Polysubstance dependence including opioid type drug with complication, episodic abuse (HCC) Active Problems:   MDD (major depressive disorder), recurrent severe, without psychosis (HCC)   Polysubstance dependence including opioid type drug without complication, episodic abuse (HCC)  Long Term Goal(s):     Short Term Goals:       Medication Management: Evaluate patient's response, side effects, and tolerance of medication regimen.  Therapeutic Interventions: 1 to 1 sessions, Unit Group sessions and Medication administration.  Evaluation of Outcomes: Progressing   RN Treatment Plan for Primary Diagnosis: Polysubstance dependence including opioid type drug with complication, episodic abuse (HCC) Long Term Goal(s): Knowledge of disease and therapeutic regimen to maintain health will improve  Short Term Goals: Ability to remain free from injury will improve, Ability to disclose and discuss suicidal ideas and Ability to identify and develop effective coping behaviors will improve  Medication Management: RN will administer medications as ordered by provider, will assess and evaluate patient's response and provide education to patient for prescribed medication. RN will report any adverse and/or side effects to prescribing provider.  Therapeutic Interventions: 1 on 1 counseling sessions, Psychoeducation, Medication administration, Evaluate responses to treatment, Monitor vital signs and CBGs as  ordered, Perform/monitor CIWA, COWS, AIMS and Fall Risk screenings as ordered, Perform wound care treatments as ordered.  Evaluation of Outcomes: Progressing   LCSW Treatment Plan for Primary Diagnosis: Polysubstance dependence including opioid type drug with complication, episodic abuse (HCC) Long Term Goal(s): Safe transition to appropriate next level of care at discharge, Engage patient in therapeutic group addressing interpersonal concerns.  Short Term Goals: Engage patient in aftercare planning with referrals and resources, Facilitate patient progression through stages of change regarding substance use diagnoses and concerns and Identify triggers associated with mental health/substance abuse issues  Therapeutic Interventions: Assess for all discharge needs, 1 to 1 time with Social worker, Explore available resources and support systems, Assess for adequacy in community support network, Educate family and significant other(s) on suicide prevention, Complete Psychosocial Assessment, Interpersonal group therapy.  Evaluation of Outcomes: Progressing   Progress in Treatment: Attending groups: No. new to unit. Continuing to assess.  Participating in groups: No.  Taking medication as prescribed: Yes. Toleration medication: Yes. Family/Significant other contact made: No, will contact:  family member if patient consents. Patient understands diagnosis: Yes. Discussing patient identified problems/goals with staff: Yes. Medical problems stabilized or resolved: Yes. Denies suicidal/homicidal ideation: No. Issues/concerns per patient self-inventory: Yes. Other: none   New problem(s) identified: No, Describe:  n/a  New Short Term/Long Term Goal(s): medication stabililzation; detox; development of comprehensive mental wellness/sobriety plan.   Discharge Plan or Barriers: CSW assessing for appropriate referrals. Pt currently follows up with her PCP for psychiatric medications.   Reason for  Continuation of Hospitalization: Depression Medication stabilization Suicidal ideation Withdrawal symptoms  Estimated Length of Stay: 3-5 days   Attendees: Patient: 07/22/2016 9:14 AM  Physician: Dr. Mckinley Jewel MD 07/22/2016 9:14 AM  Nursing: Bayard Hugger RN 07/22/2016 9:14 AM  RN Care Manager: Juliann Pares 07/22/2016 9:14 AM  Social Worker: Trula Slade, LCSW 07/22/2016 9:14 AM  Recreational Therapist:  07/22/2016 9:14 AM  Other: Serena Colonel NP 07/22/2016 9:14 AM  Other:  07/22/2016 9:14 AM  Other: 07/22/2016 9:14 AM    Scribe for Treatment Team: Ledell Peoples Smart, LCSW 07/22/2016 9:14 AM

## 2016-07-22 NOTE — Progress Notes (Addendum)
Nursing Progress Note: 7p-7a D: Pt currently presents with a flat affect and behavior.  Pt states "I feel better just by being here. I'm not withdrawling right now." Pt reports ok sleep with current medication regimen.   A: Pt provided with medications per providers orders. Pt's labs and vitals were monitored throughout the night. Pt supported emotionally and encouraged to express concerns and questions. Pt educated on medications.  R: Pt's safety ensured with 15 minute and environmental checks. Pt currently denies SI/HI/Self Harm and A/V hallucinations. Pt verbally agrees to seek staff if SI/HI or A/VH occurs and to consult with staff before acting on any harmful thoughts. Will continue POC.
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
## 2016-07-22 NOTE — Progress Notes (Signed)
Patient attended AA group meeting.  

## 2016-07-22 NOTE — Progress Notes (Signed)
D:Pt rates depression as a 7 and anxiety as a 10 on 0-10 scale with 10 being the most. Pt reports that she has had depression for a long time and stopped her medication. She is asking for a medication adjustment.  A:Offered support, encouragement and 15 minute checks. R:Pt denies si and hi. She denies hallucinations. Safety maintained on the unit.

## 2016-07-23 DIAGNOSIS — F17210 Nicotine dependence, cigarettes, uncomplicated: Secondary | ICD-10-CM

## 2016-07-23 LAB — LIPID PANEL
Cholesterol: 165 mg/dL (ref 0–200)
HDL: 56 mg/dL (ref >40)
LDL CALC: 89 mg/dL (ref 0–99)
TRIGLYCERIDES: 101 mg/dL (ref <150)
Total CHOL/HDL Ratio: 2.9 RATIO
VLDL: 20 mg/dL (ref 0–40)

## 2016-07-23 LAB — IRON AND TIBC
Iron: 65 ug/dL (ref 28–170)
SATURATION RATIOS: 13 % (ref 10.4–31.8)
TIBC: 511 ug/dL — AB (ref 250–450)
UIBC: 446 ug/dL

## 2016-07-23 LAB — GROWTH HORMONE: Growth Hormone: 1.6 ng/mL (ref 0.0–10.0)

## 2016-07-23 LAB — TSH: TSH: 0.989 u[IU]/mL (ref 0.350–4.500)

## 2016-07-23 MED ORDER — LORAZEPAM 1 MG PO TABS
ORAL_TABLET | ORAL | Status: AC
  Filled 2016-07-23: qty 1

## 2016-07-23 MED ORDER — METHOCARBAMOL 500 MG PO TABS
ORAL_TABLET | ORAL | Status: AC
  Filled 2016-07-23: qty 1

## 2016-07-23 MED ORDER — METHOCARBAMOL 500 MG PO TABS
ORAL_TABLET | ORAL | Status: AC
Start: 2016-07-23 — End: 2016-07-23
  Filled 2016-07-23: qty 1

## 2016-07-23 NOTE — Progress Notes (Signed)
Data. Patient denies SI/HI/AVH. Patient isolating in her room throughout the day, napping. Affect is blunt and has minimal brightening with interaction. On her self assessment patient reports 6/10 for depression and anxiety and 5/10 for hopelessness. Her goal for today is: "Thinking more positive".  Action. Emotional support and encouragement offered. Education provided on medication, indications and side effect. Q 15 minute checks done for safety. Response. Safety on the unit maintained through 15 minute checks.  Medications taken as prescribed. Not attending groups. Remained calm and appropriate through out shift.

## 2016-07-23 NOTE — Plan of Care (Signed)
Problem: Education: Goal: Knowledge of the prescribed therapeutic regimen will improve Outcome: Progressing PAtient was able to take her medications as ordered this shift.

## 2016-07-23 NOTE — [facility] Group Notes (Signed)
[facility] Group Notes:  (Nursing/MHT/Case Management/Adjunct)  Date:  07/23/2016  Time:  12:36 PM  Type of Therapy:  Nurse Education  Participation Level:  Did Not Attend   Almira Bar 07/23/2016, 12:36 PM

## 2016-07-23 NOTE — Progress Notes (Signed)
Patient did attend the evening speaker AA meeting.  

## 2016-07-23 NOTE — Progress Notes (Signed)
[facility] MD Progress Note  07/23/2016 11:26 AM Judy Robles  MRN:  [id number] Subjective:  Alert oriented, depressed, remains somewhat vague about her drug and opiate use HPI: Patient remains somewhat vague about her opiate use and says only used cocaine once.  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 Remains depressed, slept fair but endorses depression. No psychotic symptoms Aggravating factor: can not work. Legal charges for forging checks . Lost place to stay Modifying factor: some support from family Severity of depression: 4/10. 10 being no depression  Gabapentin and effexor has been started.  Withdrawal protocol for Opiates has been started   Principal Problem: Polysubstance dependence including opioid type drug with complication, episodic abuse (HCC) Diagnosis:   Patient Active Problem List   Diagnosis Date Noted  . Chronic renal insufficiency [N18.9] 07/22/2016  . Polysubstance dependence including opioid type drug with complication, episodic abuse (HCC) [K91.791] 07/21/2016  . MDD (major depressive disorder), recurrent severe, without psychosis (HCC) [F33.2] 07/21/2016  . Chronic anemia [D64.9] 07/03/2015  . Chronic pain [G89.29] 07/03/2015  . Diarrhea [R19.7] 05/01/2014  . Essential hypertension, benign [I10] 04/17/2014   Total Time spent with patient: 20 minutes  Past Psychiatric History: Opiate use disorder, Depression  Past Medical History:  Past Medical History:  Diagnosis Date  . Arthritis   . Hypertension   . MRSA (methicillin resistant Staphylococcus aureus)   . Obesity   . Sleep apnea     Past Surgical History:  Procedure Laterality Date  . I&D EXTREMITY Right 04/16/2014   Procedure: IRRIGATION AND DEBRIDEMENT EXTREMITY;  Surgeon: Sharma Covert, MD;  Location: [facility] OR;  Service: Orthopedics;  Laterality: Right;   Family History:  Family History  Problem Relation Age of Onset  . Throat cancer Mother   . Hypertension Father   . Stroke Father    Family  Psychiatric  History: Depression.  Social History:  History  Alcohol Use No     History  Drug Use No    Social History   Social History  . Marital status: Single    Spouse name: N/A  . Number of children: N/A  . Years of education: N/A   Social History Main Topics  . Smoking status: Current Every Day Smoker    Packs/day: 0.25    Types: Cigarettes  . Smokeless tobacco: Never Used     Comment: 1/2PPD  . Alcohol use No  . Drug use: No  . Sexual activity: Yes    Birth control/ protection: Surgical   Other Topics Concern  . None   Social History Narrative  . None   Additional Social History:                         Sleep: Fair  Appetite:  Fair  Current Medications: Current Facility-Administered Medications  Medication Dose Route Frequency Provider Last Rate Last Dose  . LORazepam (ATIVAN) 1 MG tablet           . methocarbamol (ROBAXIN) 500 MG tablet           . alum & mag hydroxide-simeth (MAALOX/MYLANTA) 200-200-20 MG/5ML suspension 30 mL  30 mL Oral Q4H PRN Laveda Abbe, NP      . amLODipine (NORVASC) tablet 5 mg  5 mg Oral Daily Acquanetta Sit, MD   5 mg at 07/23/16 0758  . cloNIDine (CATAPRES) tablet 0.1 mg  0.1 mg Oral QID Laveda Abbe, NP   0.1 mg at  07/23/16 0759   Followed by  . cloNIDine (CATAPRES) tablet 0.1 mg  0.1 mg Oral BH-qamhs Laveda Abbe, NP       Followed by  . [START ON 07/26/2016] cloNIDine (CATAPRES) tablet 0.1 mg  0.1 mg Oral QAC breakfast Laveda Abbe, NP      . dicyclomine (BENTYL) tablet 20 mg  20 mg Oral Q6H PRN Laveda Abbe, NP      . gabapentin (NEURONTIN) tablet 800 mg  800 mg Oral TID Acquanetta Sit, MD   800 mg at 07/23/16 0759  . hydrOXYzine (ATARAX/VISTARIL) tablet 25 mg  25 mg Oral Q6H PRN Laveda Abbe, NP      . ibuprofen (ADVIL,MOTRIN) tablet 600 mg  600 mg Oral Q6H PRN Jackelyn Poling, NP   600 mg at 07/23/16 0803  . loperamide (IMODIUM) capsule 2-4 mg  2-4 mg  Oral PRN Laveda Abbe, NP      . LORazepam (ATIVAN) tablet 1 mg  1 mg Oral Q6H PRN Laveda Abbe, NP      . LORazepam (ATIVAN) tablet 1 mg  1 mg Oral BID Laveda Abbe, NP       Followed by  . [START ON 07/25/2016] LORazepam (ATIVAN) tablet 1 mg  1 mg Oral Daily Laveda Abbe, NP      . magnesium hydroxide (MILK OF MAGNESIA) suspension 30 mL  30 mL Oral Daily PRN Laveda Abbe, NP      . methocarbamol (ROBAXIN) tablet 500 mg  500 mg Oral Q8H PRN Laveda Abbe, NP   500 mg at 07/23/16 0803  . multivitamin with minerals tablet 1 tablet  1 tablet Oral Daily Laveda Abbe, NP   1 tablet at 07/23/16 0800  . ondansetron (ZOFRAN-ODT) disintegrating tablet 4 mg  4 mg Oral Q6H PRN Laveda Abbe, NP      . thiamine (VITAMIN B-1) tablet 100 mg  100 mg Oral Daily Laveda Abbe, NP   100 mg at 07/23/16 0800  . traZODone (DESYREL) tablet 50 mg  50 mg Oral QHS PRN Laveda Abbe, NP   50 mg at 07/22/16 2158  . venlafaxine XR (EFFEXOR-XR) 24 hr capsule 75 mg  75 mg Oral Q breakfast Acquanetta Sit, MD   75 mg at 07/23/16 0800    Lab Results:  Results for orders placed or performed during the hospital encounter of 07/21/16 (from the past 48 hour(s))  TSH     Status: None   Collection Time: 07/23/16  6:28 AM  Result Value Ref Range   TSH 0.989 0.350 - 4.500 uIU/mL    Comment: Performed by a 3rd Generation assay with a functional sensitivity of <=0.01 uIU/mL. Performed at [facility]     Blood Alcohol level:  No results found for: [facility]  Metabolic Disorder Labs: Lab Results  Component Value Date   HGBA1C 5.6 04/16/2014   MPG 114 04/16/2014   No results found for: PROLACTIN No results found for: CHOL, TRIG, HDL, CHOLHDL, VLDL, LDLCALC  Physical Findings: AIMS: Facial and Oral Movements Muscles of Facial Expression: None, normal Lips and Perioral Area: None, normal Jaw: None, normal Tongue: None,  normal,Extremity Movements Upper (arms, wrists, hands, fingers): None, normal Lower (legs, knees, ankles, toes): None, normal, Trunk Movements Neck, shoulders, hips: None, normal, Overall Severity Severity of abnormal movements (highest score from questions above): None, normal Incapacitation due to abnormal movements: None, normal Patient's awareness of abnormal movements (  rate only patient's report): No Awareness,    CIWA:  CIWA-Ar Total: 4 COWS:  COWS Total Score: 0  Musculoskeletal: Strength & Muscle Tone: within normal limits Gait & Station: normal Patient leans: no lean  Psychiatric Specialty Exam: Physical Exam  Constitutional: She appears well-developed.  HENT:  Head: Normocephalic.    Review of Systems  Cardiovascular: Negative for chest pain.  Skin: Negative for rash.  Psychiatric/Behavioral: Positive for depression and substance abuse. Negative for suicidal ideas. The patient is nervous/anxious.     Blood pressure (!) 155/82, pulse 84, temperature 98 F (36.7 C), resp. rate 18, height 5\' 3"  (1.6 m), weight 110.2 kg (243 lb), SpO2 100 %.Body mass index is 43.05 kg/m.  General Appearance: Casual  Eye Contact:  Fair  Speech:  Slow  Volume:  Decreased  Mood:  Dysphoric  Affect:  Constricted and Depressed  Thought Process:  Goal Directed  Orientation:  Full (Time, Place, and Person)  Thought Content:  Rumination  Suicidal Thoughts:  No  Homicidal Thoughts:  No  Memory:  Immediate;   Fair Recent;   Fair  Judgement:  Poor  Insight:  Shallow  Psychomotor Activity:  Decreased  Concentration:  Concentration: Fair and Attention Span: Fair  Recall:  of Knowledge:  Fair  Language:  Fair  Akathisia:  Negative  Handed:  Right  AIMS (if indicated):     Assets:  Desire for Improvement  ADL's:  Intact  Cognition:  WNL  Sleep:  Number of Hours: 6.25     Treatment Plan Summary: Daily contact with patient to assess and evaluate symptoms and progress in  treatment, Medication management and Plan as follows  Opiate use disorder: continue clonidine and lorazepam for withdrawals. Monitor vitals Continue gabapentin Major depression: continue effexor Anxiety disorder; holf off xanax. Continue effexor and withdrawal protocol Supportive group therapy and develop insight  [employer], MD 07/23/2016, 11:26 AM

## 2016-07-23 NOTE — [facility] Group Notes (Addendum)
Adult Psychoeducational Group Note  Date:  07/23/2016 Time:  4:29 PM  Group Topic/Focus:  Identifying Needs:   The focus of this group is to help patients identify their personal needs that have been historically problematic and identify healthy behaviors to address their needs.   Participation Level:  Did Not Attend   Lauris Poag 07/23/2016, 4:29 PM

## 2016-07-23 NOTE — Progress Notes (Signed)
Nursing Progress Note: 7p-7a D: Pt currently presents with a depressed/flat affect and behavior. Pt reports to writer that their goal is to "be less isolative." Pt states "I learned how to better communicate my feelings in group today." Pt reports good sleep with current medication regimen.   A: Pt provided with medications per providers orders. Pt's labs and vitals were monitored throughout the night. Pt supported emotionally and encouraged to express concerns and questions. Pt educated on medications.  R: Pt's safety ensured with 15 minute and environmental checks. Pt currently denies SI/HI/Self Harm and A/V hallucinations. Pt verbally agrees to seek staff if SI/HI or A/VH occurs and to consult with staff before acting on any harmful thoughts. Will continue POC.

## 2016-07-23 NOTE — [facility] Group Notes (Signed)
[facility] Group Notes: (Clinical Social Work)   07/23/2016      Type of Therapy:  Group Therapy   Participation Level:  Did Not Attend despite MHT prompting   Ambrose Mantle, LCSW 07/23/2016, 12:49 PM

## 2016-07-24 MED ORDER — METHOCARBAMOL 500 MG PO TABS
ORAL_TABLET | ORAL | Status: AC
  Filled 2016-07-24: qty 1

## 2016-07-24 MED ORDER — METHOCARBAMOL 500 MG PO TABS
ORAL_TABLET | ORAL | Status: AC
  Administered 2016-07-24: 500 mg via ORAL
  Filled 2016-07-24: qty 1

## 2016-07-24 NOTE — [facility] Group Notes (Signed)
[facility] Group Notes:  (Nursing/MHT/Case Management/Adjunct)  Date:  07/24/2016  Time:  4:56 PM  Type of Therapy:  Nurse Education  Participation Level:  Did Not Attend   Almira Bar 07/24/2016, 4:56 PM

## 2016-07-24 NOTE — Progress Notes (Signed)
[facility] MD Progress Note  07/24/2016 11:55 AM Judy Robles  MRN:  [id number] Subjective:  Alert oriented, depressed, remains somewhat vague about her drug and opiate use HPI: Patient somewhat sedate, does not appear to active in ward.  Was vague about her opiate use. Endorses one time cocaine.  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 10 being no depression Tolerating mediations.   Gabapentin and effexor has been started.  On opiate withdrawal protocol.  Principal Problem: Polysubstance dependence including opioid type drug with complication, episodic abuse (HCC) Diagnosis:   Patient Active Problem List   Diagnosis Date Noted  . Chronic renal insufficiency [N18.9] 07/22/2016  . Polysubstance dependence including opioid type drug with complication, episodic abuse (HCC) [U54.270] 07/21/2016  . MDD (major depressive disorder), recurrent severe, without psychosis (HCC) [F33.2] 07/21/2016  . Chronic anemia [D64.9] 07/03/2015  . Chronic pain [G89.29] 07/03/2015  . Diarrhea [R19.7] 05/01/2014  . Essential hypertension, benign [I10] 04/17/2014   Total Time spent with patient: 20 minutes  Past Psychiatric History: Opiate use disorder, Depression  Past Medical History:  Past Medical History:  Diagnosis Date  . Arthritis   . Hypertension   . MRSA (methicillin resistant Staphylococcus aureus)   . Obesity   . Sleep apnea     Past Surgical History:  Procedure Laterality Date  . I&D EXTREMITY Right 04/16/2014   Procedure: IRRIGATION AND DEBRIDEMENT EXTREMITY;  Surgeon: Sharma Covert, MD;  Location: [facility] OR;  Service: Orthopedics;  Laterality: Right;   Family History:  Family History  Problem Relation Age of Onset  . Throat cancer Mother   . Hypertension Father   . Stroke Father    Family Psychiatric  History: Depression.  Social  History:  History  Alcohol Use No     History  Drug Use No    Social History   Social History  . Marital status: Single    Spouse name: N/A  . Number of children: N/A  . Years of education: N/A   Social History Main Topics  . Smoking status: Current Every Day Smoker    Packs/day: 0.25    Types: Cigarettes  . Smokeless tobacco: Never Used     Comment: 1/2PPD  . Alcohol use No  . Drug use: No  . Sexual activity: Yes    Birth control/ protection: Surgical   Other Topics Concern  . None   Social History Narrative  . None   Additional Social History:                         Sleep: Fair  Appetite:  Fair  Current Medications: Current Facility-Administered Medications  Medication Dose Route Frequency Provider Last Rate Last Dose  . alum & mag hydroxide-simeth (MAALOX/MYLANTA) 200-200-20 MG/5ML suspension 30 mL  30 mL Oral Q4H PRN Laveda Abbe, NP      . amLODipine (NORVASC) tablet 5 mg  5 mg Oral Daily Acquanetta Sit, MD   5 mg at 07/23/16 0758  . cloNIDine (CATAPRES) tablet 0.1 mg  0.1 mg Oral BH-qamhs Laveda Abbe, NP   0.1 mg at 07/23/16 2117   Followed by  . [START ON 07/26/2016] cloNIDine (CATAPRES) tablet 0.1 mg  0.1 mg Oral QAC breakfast Laveda Abbe, NP      . dicyclomine (BENTYL) tablet 20 mg  20  mg Oral Q6H PRN Laveda AbbeLaurie Britton Parks, NP      . gabapentin (NEURONTIN) tablet 800 mg  800 mg Oral TID Acquanetta SitElizabeth Woods Oates, MD   800 mg at 07/23/16 1732  . hydrOXYzine (ATARAX/VISTARIL) tablet 25 mg  25 mg Oral Q6H PRN Laveda AbbeLaurie Britton Parks, NP   25 mg at 07/23/16 2119  . ibuprofen (ADVIL,MOTRIN) tablet 600 mg  600 mg Oral Q6H PRN Jackelyn PolingJason A Berry, NP   600 mg at 07/23/16 1735  . loperamide (IMODIUM) capsule 2-4 mg  2-4 mg Oral PRN Laveda AbbeLaurie Britton Parks, NP      . LORazepam (ATIVAN) tablet 1 mg  1 mg Oral BID Laveda AbbeLaurie Britton Parks, NP   1 mg at 07/23/16 1733   Followed by  . [START ON 07/25/2016] LORazepam (ATIVAN) tablet 1 mg  1 mg  Oral Daily Laveda AbbeLaurie Britton Parks, NP      . magnesium hydroxide (MILK OF MAGNESIA) suspension 30 mL  30 mL Oral Daily PRN Laveda AbbeLaurie Britton Parks, NP      . methocarbamol (ROBAXIN) tablet 500 mg  500 mg Oral Q8H PRN Laveda AbbeLaurie Britton Parks, NP   500 mg at 07/23/16 1735  . multivitamin with minerals tablet 1 tablet  1 tablet Oral Daily Laveda AbbeLaurie Britton Parks, NP   1 tablet at 07/23/16 0800  . ondansetron (ZOFRAN-ODT) disintegrating tablet 4 mg  4 mg Oral Q6H PRN Laveda AbbeLaurie Britton Parks, NP      . thiamine (VITAMIN B-1) tablet 100 mg  100 mg Oral Daily Laveda AbbeLaurie Britton Parks, NP   100 mg at 07/23/16 0800  . traZODone (DESYREL) tablet 50 mg  50 mg Oral QHS PRN Laveda AbbeLaurie Britton Parks, NP   50 mg at 07/23/16 2119  . venlafaxine XR (EFFEXOR-XR) 24 hr capsule 75 mg  75 mg Oral Q breakfast Acquanetta SitElizabeth Woods Oates, MD   75 mg at 07/23/16 0800    Lab Results:  Results for orders placed or performed during the hospital encounter of 07/21/16 (from the past 48 hour(s))  Growth hormone     Status: None   Collection Time: 07/23/16  6:28 AM  Result Value Ref Range   Growth Hormone 1.6 0.0 - 10.0 ng/mL    Comment: (NOTE) Performed At: [facility] [address] [place], [place] [id number] Mila HomerHancock William F MD NW:[id number]:[phone] Performed at [facility]   Iron and TIBC     Status: Abnormal   Collection Time: 07/23/16  6:28 AM  Result Value Ref Range   Iron 65 28 - 170 ug/dL   TIBC 578511 (H) 469250 - 629450 ug/dL   Saturation Ratios 13 10.4 - 31.8 %   UIBC 446 ug/dL    Comment: [facility]  Lipid panel     Status: None   Collection Time: 07/23/16  6:28 AM  Result Value Ref Range   Cholesterol 165 0 - 200 mg/dL   Triglycerides 528101 <413<150 mg/dL   HDL 56 >24>40 mg/dL   Total CHOL/HDL Ratio 2.9 RATIO   VLDL 20 0 - 40 mg/dL   LDL Cholesterol 89 0 - 99 mg/dL    Comment:        Total Cholesterol/HDL:CHD Risk Coronary Heart Disease Risk Table                     Men   Women  1/2 Average  Risk   3.4   3.3  Average Risk       5.0  4.4  2 X Average Risk   9.6   7.1  3 X Average Risk  23.4   11.0        Use the calculated Patient Ratio above and the CHD Risk Table to determine the patient's CHD Risk.        ATP III CLASSIFICATION (LDL):  <100     mg/dL   Optimal  277-412  mg/dL   Near or Above                    Optimal  130-159  mg/dL   Borderline  878-676  mg/dL   High  >720     mg/dL   Very High Performed at [facility]   TSH     Status: None   Collection Time: 07/23/16  6:28 AM  Result Value Ref Range   TSH 0.989 0.350 - 4.500 uIU/mL    Comment: Performed by a 3rd Generation assay with a functional sensitivity of <=0.01 uIU/mL. Performed at [facility]     Blood Alcohol level:  No results found for: [facility]  Metabolic Disorder Labs: Lab Results  Component Value Date   HGBA1C 5.6 04/16/2014   MPG 114 04/16/2014   No results found for: PROLACTIN Lab Results  Component Value Date   CHOL 165 07/23/2016   TRIG 101 07/23/2016   HDL 56 07/23/2016   CHOLHDL 2.9 07/23/2016   VLDL 20 07/23/2016   LDLCALC 89 07/23/2016    Physical Findings: AIMS: Facial and Oral Movements Muscles of Facial Expression: None, normal Lips and Perioral Area: None, normal Jaw: None, normal Tongue: None, normal,Extremity Movements Upper (arms, wrists, hands, fingers): None, normal Lower (legs, knees, ankles, toes): None, normal, Trunk Movements Neck, shoulders, hips: None, normal, Overall Severity Severity of abnormal movements (highest score from questions above): None, normal Incapacitation due to abnormal movements: None, normal Patient's awareness of abnormal movements (rate only patient's report): No Awareness, Dental Status Current problems with teeth and/or dentures?: No Does patient usually wear dentures?: No  CIWA:  CIWA-Ar Total: 0 COWS:  COWS Total Score: 2  Musculoskeletal: Strength & Muscle Tone: within normal limits Gait &  Station: normal Patient leans: no lean  Psychiatric Specialty Exam: Physical Exam  Constitutional: She appears well-developed.  HENT:  Head: Normocephalic.    Review of Systems  Cardiovascular: Negative for palpitations.  Gastrointestinal: Negative for nausea.  Skin: Negative for rash.  Psychiatric/Behavioral: Positive for depression.    Blood pressure 109/65, pulse 79, temperature 98 F (36.7 C), resp. rate 18, height 5\' 3"  (1.6 m), weight 110.2 kg (243 lb), SpO2 100 %.Body mass index is 43.05 kg/m.  General Appearance: Casual  Eye Contact:  Fair  Speech:  Slow  Volume:  Decreased  Mood:  depressed  Affect: constricted  Thought Process:  Goal Directed  Orientation:  Full (Time, Place, and Person)  Thought Content:  Rumination  Suicidal Thoughts:  No  Homicidal Thoughts:  No  Memory:  Immediate;   Fair Recent;   Fair  Judgement:  Poor  Insight:  Shallow  Psychomotor Activity:  Decreased  Concentration:  Concentration: Fair and Attention Span: Fair  Recall:  of Knowledge:  Fair  Language:  Fair  Akathisia:  Negative  Handed:  Right  AIMS (if indicated):     Assets:  Desire for Improvement  ADL's:  Intact  Cognition:  WNL  Sleep:  Number of Hours: 6.25     Treatment Plan Summary:  Daily contact with patient to assess and evaluate symptoms and progress in treatment, Medication management and Plan as follows  Opiate use disorder: continue protocol with clonidine.  Vitals improved Continue gabapentin Major depression: continue effexor. Anxiety disorder; holf off xanax. Continue effexor and withdrawal protocol Supportive group therapy and develop insight  Thresa Ross, MD 07/24/2016, 11:55 AMPatient ID: Judy Robles, female   DOB: 07-05-1976, 40 y.o.   MRN: [id number]

## 2016-07-24 NOTE — [facility] Group Notes (Signed)
[facility] Group Notes:  (Nursing/MHT/Case Management/Adjunct)  Date:  07/24/2016  Time:  5:08 PM  Type of Therapy:  Nurse Education  Participation Level:  Did Not Attend   Almira Bar 07/24/2016, 5:08 PM

## 2016-07-24 NOTE — Progress Notes (Signed)
Patient did attend the evening speaker AA meeting.  

## 2016-07-24 NOTE — Plan of Care (Signed)
Problem: Education: Goal: Knowledge of the prescribed therapeutic regimen will improve Outcome: Progressing Patient has taken medications, but has been late for them. Needs continued education on the importance of taking medications at the scheduled times.

## 2016-07-24 NOTE — Progress Notes (Signed)
Nursing Progress Note: 7p-7a D: Pt currently presents with a depressed/flat affect and pleasant behavior. Pt reports to writer that their goal is to "go to more groups." Pt states "i'm feeling more steady today. I am not tremoring so much." Pt reports good sleep with current medication regimen.   A: Pt provided with medications per providers orders. Pt's labs and vitals were monitored throughout the night. Pt supported emotionally and encouraged to express concerns and questions. Pt educated on medications.  R: Pt's safety ensured with 15 minute and environmental checks. Pt currently denies SI/HI/Self Harm and A/V hallucinations. Pt verbally agrees to seek staff if SI/HI or A/VH occurs and to consult with staff before acting on any harmful thoughts. Will continue POC.

## 2016-07-24 NOTE — Progress Notes (Addendum)
Data. Patient denies SI/HI/AVH. Patient continues to isolate in her room this shift. Her affect is brighter and some smiling noted. She reports feeling physically more healthy today. On her self assessment paient reports 7/10 for depression and hopelessness and 8/10 for anxiety. Her goal for today is: "be more involved".  Action. Emotional support and encouragement offered. Education provided on medication, indications and side effect. Q 15 minute checks done for safety. Response. Safety on the unit maintained through 15 minute checks.  Medications taken as prescribed. Did no attended groups.

## 2016-07-24 NOTE — [facility] Group Notes (Signed)
[facility] Group Notes: (Clinical Social Work)   07/24/2016      Type of Therapy:  Group Therapy   Participation Level:  Did Not Attend despite MHT prompting   Ambrose Mantle, LCSW 07/24/2016, 11:31 AM

## 2016-07-25 MED ORDER — ADULT MULTIVITAMIN W/MINERALS CH
ORAL_TABLET | ORAL | Status: AC
Start: 2016-07-25 — End: 2016-07-25
  Filled 2016-07-25: qty 1

## 2016-07-25 MED ORDER — VITAMIN B-1 100 MG PO TABS
ORAL_TABLET | ORAL | Status: AC
  Filled 2016-07-25: qty 1

## 2016-07-25 MED ORDER — METHOCARBAMOL 500 MG PO TABS
ORAL_TABLET | ORAL | Status: AC
  Administered 2016-07-25: 500 mg via ORAL
  Filled 2016-07-25: qty 1

## 2016-07-25 MED ORDER — LORAZEPAM 1 MG PO TABS
ORAL_TABLET | ORAL | Status: AC
  Filled 2016-07-25: qty 1

## 2016-07-25 MED ORDER — NICOTINE 7 MG/24HR TD PT24
7.0000 mg | MEDICATED_PATCH | Freq: Every day | TRANSDERMAL | Status: DC
  Administered 2016-07-25 – 2016-07-27 (×3): 7 mg via TRANSDERMAL
  Filled 2016-07-25 (×5): qty 1

## 2016-07-25 MED ORDER — ACETAMINOPHEN 325 MG PO TABS: 650.0000 mg | ORAL_TABLET | Freq: Four times a day (QID) | ORAL | Status: DC | PRN

## 2016-07-25 MED ORDER — GUAIFENESIN 100 MG/5ML PO SOLN
5.0000 mL | ORAL | Status: DC | PRN
  Administered 2016-07-25: 100 mg via ORAL
  Filled 2016-07-25: qty 5

## 2016-07-25 MED ORDER — METHOCARBAMOL 500 MG PO TABS
ORAL_TABLET | ORAL | Status: AC
  Filled 2016-07-25: qty 1

## 2016-07-25 MED ORDER — AMLODIPINE BESYLATE 10 MG PO TABS
10.0000 mg | ORAL_TABLET | Freq: Every day | ORAL | Status: DC
  Administered 2016-07-26 – 2016-07-27 (×2): 10 mg via ORAL
  Filled 2016-07-25 (×4): qty 1

## 2016-07-25 NOTE — Progress Notes (Signed)
Patient ID: Judy Robles, female   DOB: October 26, 1975, 40 y.o.   MRN: [id number]  DAR: Pt. Denies SI/HI and A/V Hallucinations. She reports sleep is poor, appetite is good, energy level is low, and concentration is good. She rates depression 6/10, hopelessness 6/10, and anxiety 8/10. Patient does report pain that is chronic in nature and is receiving medication for this. Support and encouragement provided to the patient. Scheduled medications and PRN medications administered to patient per physician's orders. Patient is seen in the milieu however appears to keep to herself. She is pleasant upon interaction but affect is flat and mood is depressed. Patient is cooperative with staff at this time and no behavioral issues noted. Q15 minute checks are maintained for safety.

## 2016-07-25 NOTE — Progress Notes (Signed)
Recreation Therapy Notes  Date: 07/25/16 Time: 0930 Location: [address] Dayroom  Group Topic: Stress Management  Goal Area(s) Addresses:  Patient will verbalize importance of using healthy stress management.  Patient will identify positive emotions associated with healthy stress management.   Intervention: Calm App  Activity :  LRT introduced the stress management concept of meditation.  LRT played a meditation on anxiety from the Calm App.  Patients were to follow along with the app to engage in the meditation.  Education:  Stress Management, Discharge Planning.   Education Outcome: Acknowledges edcuation/In group clarification offered/Needs additional education  Clinical Observations/Feedback: Pt did not attend group.    Caroll Rancher, LRT/CTRS         Caroll Rancher A 07/25/2016 11:50 AM

## 2016-07-25 NOTE — Progress Notes (Signed)
Patient ID: [name], female   DOB: July 22, 1976, 40 y.o.   MRN: [id number]  Pt currently presents with a depressed affect and behavior. Pt tearfully states "I don't have anyone, I don't know what I am going to do when I leave here." Pt reports good sleep with current medication regimen. Pt reports pain in her R foot, unsure of cause. Requests medications often for pain.   Pt provided with medications per providers orders. Pt's labs and vitals were monitored throughout the night. Pt supported emotionally and encouraged to express concerns and questions. Pt educated on medications.  Pt's safety ensured with 15 minute and environmental checks. Pt endorses passive SI, no plan while at [facility] but plan to "take all of my lisinopril medications to damage my kidneys." Pt currently denies HI and A/V hallucinations. Pt verbally agrees to seek staff if HI or A/VH occurs and to consult with staff before acting on any harmful thoughts. Will continue POC.

## 2016-07-25 NOTE — Progress Notes (Signed)
[facility] MD Progress Note  07/25/2016 1:57 PM  Patient Active Problem List   Diagnosis Date Noted  . Chronic renal insufficiency 07/22/2016  . Polysubstance dependence including opioid type drug with complication, episodic abuse (HCC) 07/21/2016  . MDD (major depressive disorder), recurrent severe, without psychosis (HCC) 07/21/2016  . Chronic anemia 07/03/2015  . Chronic pain 07/03/2015  . Diarrhea 05/01/2014  . Essential hypertension, benign 04/17/2014    Diagnosis: Opiate use disorder, severe history of depression and other use disorders  Subjective: Patient reports that she is not actually allergic to Tylenol but was told to avoid it secondary to history of elevated liver function tests. She reports that she is having a cough currently on congestion and would like some kind of cough medicine. She denies any current suicidal or homicidal ideation, plan or intent and denies any significant detox symptoms. Patient is lying in bed napping all morning and chart reflects that her level of participation has been somewhat variable. Patient does admit she is not anxious to be discharged but she is potentially interested in attending a program such as [facility]  Objective: Well-developed heavyset woman in no apparent distress napping in bed but easily arousable and pleasant and appropriate. Mood is all right affect is congruent thought processes linear and goal-directed speech and motor appear within normal limits thought content denies any current suicidal or homicidal ideation, plan or intent IQ appears an average range insight and judgment are fair  TSH, growth hormone cholesterol panel all within normal limits     Current Facility-Administered Medications (Cardiovascular):  Melene Muller ON 07/26/2016] amLODipine (NORVASC) tablet 10 mg .  [EXPIRED] cloNIDine (CATAPRES) tablet 0.1 mg **FOLLOWED BY** cloNIDine (CATAPRES) tablet 0.1 mg **FOLLOWED BY** [START ON 07/26/2016] cloNIDine (CATAPRES) tablet 0.1  mg   Current Facility-Administered Medications (Respiratory):  .  guaiFENesin (ROBITUSSIN) 100 MG/5ML solution 100 mg   Current Facility-Administered Medications (Analgesics):  .  ibuprofen (ADVIL,MOTRIN) tablet 600 mg     Current Facility-Administered Medications (Other):  .  alum & mag hydroxide-simeth (MAALOX/MYLANTA) 200-200-20 MG/5ML suspension 30 mL .  dicyclomine (BENTYL) tablet 20 mg .  gabapentin (NEURONTIN) tablet 800 mg .  hydrOXYzine (ATARAX/VISTARIL) tablet 25 mg .  loperamide (IMODIUM) capsule 2-4 mg .  LORazepam (ATIVAN) 1 MG tablet .  methocarbamol (ROBAXIN) 500 MG tablet .  methocarbamol (ROBAXIN) tablet 500 mg .  multivitamin with minerals tablet 1 tablet .  multivitamin with minerals tablet .  nicotine (NICODERM CQ - dosed in mg/24 hr) patch 7 mg .  ondansetron (ZOFRAN-ODT) disintegrating tablet 4 mg .  thiamine (VITAMIN B-1) 100 MG tablet .  thiamine (VITAMIN B-1) tablet 100 mg .  traZODone (DESYREL) tablet 50 mg .  venlafaxine XR (EFFEXOR-XR) 24 hr capsule 75 mg  No current outpatient prescriptions on file.  Vital Signs:Blood pressure 124/65, pulse 78, temperature 98.4 F (36.9 C), temperature source Oral, resp. rate 20, height 5\' 3"  (1.6 m), weight 110.2 kg (243 lb), SpO2 100 %.    Lab Results: No results found for this or any previous visit (from the past 48 hour(s)).  Physical Findings: AIMS: Facial and Oral Movements Muscles of Facial Expression: None, normal Lips and Perioral Area: None, normal Jaw: None, normal Tongue: None, normal,Extremity Movements Upper (arms, wrists, hands, fingers): None, normal Lower (legs, knees, ankles, toes): None, normal, Trunk Movements Neck, shoulders, hips: None, normal, Overall Severity Severity of abnormal movements (highest score from questions above): None, normal Incapacitation due to abnormal movements: None, normal Patient's awareness of  abnormal movements (rate only patient's report): No Awareness,  Dental Status Current problems with teeth and/or dentures?: No Does patient usually wear dentures?: No  CIWA:  CIWA-Ar Total: 0 COWS:  COWS Total Score: 1   Assessment/Plan: Patient complains of congestion we'll order Robitussin when necessary and monitor  Patient may have Tylenol as this is more of a caution then and allergy. Patient was informed that her renal functions remained depressed and she should not take NSAIDs.  Social work reports patient does have a phone interview with [facility] today and it is hoped patient will participate. Discussed with patient that she is reaching the end of her inpatient stay and will be discharged in the next day or so  Acquanetta Sit, MD 07/25/2016, 1:57 PM

## 2016-07-25 NOTE — Progress Notes (Signed)
The patient attended the evening A.A.meeting and was appropriate.  

## 2016-07-25 NOTE — [facility] Group Notes (Signed)
[facility] LCSW Group Therapy  07/25/2016 3:12 PM  Type of Therapy:  Group Therapy  Participation Level:  Active  Participation Quality:  Attentive  Affect:  Appropriate  Cognitive:  Alert and Oriented  Insight:  Engaged  Engagement in Therapy:  Improving  Modes of Intervention:  Discussion, Education, Exploration, Problem-solving, Rapport Building, Socialization and Support  Summary of Progress/Problems: Today's Topic: Overcoming Obstacles. Patients identified one short term goal and potential obstacles in reaching this goal. Patients processed barriers involved in overcoming these obstacles. Patients identified steps necessary for overcoming these obstacles and explored motivation (internal and external) for facing these difficulties head on. Alexandera was attentive and engaged during today's processing group. She shared that her biggest obstacle was staying sober and being in a safe place that allowed her to maintain sobriety. Karleigh was receptive to feedback from the group and CSW. She plans to complete a phone interview with [facility] this afternoon and is working with admissions at [facility] for a more longterm placement as well. Chloris continues to show progress in the group setting and improving insight. She states that going into treatment is necessary for her in early recovery.   Joby Hershkowitz N Smart LCSW 07/25/2016, 3:12 PM

## 2016-07-25 NOTE — [facility] Group Notes (Addendum)
Invited to attend group.  Did not attend.     Spiritual care group on grief and loss facilitated by chaplain Burnis Kingfisher   Group opened with brief discussion and psycho-social ed around grief and loss in relationships and in relation to self - identifying life patterns, circumstances, changes that cause losses. Established group norm of speaking from own life experience. Group goal of establishing open and affirming space for members to share loss and experience with grief, normalize grief experience and provide psycho social education and grief support.

## 2016-07-26 MED ORDER — VENLAFAXINE HCL ER 75 MG PO CP24: 75.0000 mg | ORAL_CAPSULE | Freq: Every day | ORAL | 0 refills | Status: DC

## 2016-07-26 MED ORDER — NICOTINE 7 MG/24HR TD PT24: 7.0000 mg | MEDICATED_PATCH | Freq: Every day | TRANSDERMAL | 0 refills | Status: DC

## 2016-07-26 MED ORDER — GABAPENTIN 800 MG PO TABS: 800.0000 mg | ORAL_TABLET | Freq: Three times a day (TID) | ORAL | 0 refills | Status: DC

## 2016-07-26 MED ORDER — METHOCARBAMOL 500 MG PO TABS
ORAL_TABLET | ORAL | Status: AC
  Administered 2016-07-26: 500 mg via ORAL
  Filled 2016-07-26: qty 1

## 2016-07-26 MED ORDER — TRAZODONE HCL 50 MG PO TABS: 50.0000 mg | ORAL_TABLET | Freq: Every evening | ORAL | 0 refills | Status: DC | PRN

## 2016-07-26 MED ORDER — METHOCARBAMOL 500 MG PO TABS
500.0000 mg | ORAL_TABLET | Freq: Three times a day (TID) | ORAL | Status: DC | PRN
  Administered 2016-07-26 – 2016-07-27 (×3): 500 mg via ORAL
  Filled 2016-07-26 (×3): qty 1

## 2016-07-26 MED ORDER — AMLODIPINE BESYLATE 10 MG PO TABS: 10.0000 mg | ORAL_TABLET | Freq: Every day | ORAL | 0 refills | Status: DC

## 2016-07-26 MED ORDER — HYDROXYZINE HCL 25 MG PO TABS: 25.0000 mg | ORAL_TABLET | Freq: Four times a day (QID) | ORAL | 0 refills | Status: DC | PRN

## 2016-07-26 NOTE — Discharge Summary (Signed)
Physician Discharge Summary Note  Patient:  Judy Robles is an 40 y.o., female MRN:  [id number] DOB:  11-Nov-1975 Patient phone:  [phone] (home)  Patient address:   [address] [place] [address],  Total Time spent with patient: 30 minutes  Date of Admission:  07/21/2016 Date of Discharge: 07/27/2016  Reason for Admission:  Depression and substance abuse  Principal Problem: Polysubstance dependence including opioid type drug with complication, episodic abuse [facility]) Discharge Diagnoses: Patient Active Problem List   Diagnosis Date Noted  . Chronic renal insufficiency [N18.9] 07/22/2016  . Polysubstance dependence including opioid type drug with complication, episodic abuse (HCC) [T59.741] 07/21/2016  . MDD (major depressive disorder), recurrent severe, without psychosis (HCC) [F33.2] 07/21/2016  . Chronic anemia [D64.9] 07/03/2015  . Chronic pain [G89.29] 07/03/2015  . Diarrhea [R19.7] 05/01/2014  . Essential hypertension, benign [I10] 04/17/2014    Past Psychiatric History: see HPI  Past Medical History:  Past Medical History:  Diagnosis Date  . Arthritis   . Hypertension   . MRSA (methicillin resistant Staphylococcus aureus)   . Obesity   . Sleep apnea     Past Surgical History:  Procedure Laterality Date  . I&D EXTREMITY Right 04/16/2014   Procedure: IRRIGATION AND DEBRIDEMENT EXTREMITY;  Surgeon: Sharma Covert, MD;  Location: [facility] OR;  Service: Orthopedics;  Laterality: Right;   Family History:  Family History  Problem Relation Age of Onset  . Throat cancer Mother   . Hypertension Father   . Stroke Father    Family Psychiatric  History: see HPI Social History:  History  Alcohol Use No     History  Drug Use No    Social History   Social History  . Marital status: Single    Spouse name: N/A  . Number of children: N/A  . Years of education: N/A   Social History Main Topics  . Smoking status: Current Every Day Smoker    Packs/day: 0.25   Types: Cigarettes  . Smokeless tobacco: Never Used     Comment: 1/2PPD  . Alcohol use No  . Drug use: No  . Sexual activity: Yes    Birth control/ protection: Surgical   Other Topics Concern  . None   Social History Narrative  . None    Hospital Course:  Judy Robles is a 40 y.o. female who presented voluntarily to [facility] for depression and drug abuse.   Physical Findings: AIMS: Facial and Oral Movements Muscles of Facial Expression: None, normal Lips and Perioral Area: None, normal Jaw: None, normal Tongue: None, normal,Extremity Movements Upper (arms, wrists, hands, fingers): None, normal Lower (legs, knees, ankles, toes): None, normal, Trunk Movements Neck, shoulders, hips: None, normal, Overall Severity Severity of abnormal movements (highest score from questions above): None, normal Incapacitation due to abnormal movements: None, normal Patient's awareness of abnormal movements (rate only patient's report): No Awareness, Dental Status Current problems with teeth and/or dentures?: No Does patient usually wear dentures?: No  CIWA:  CIWA-Ar Total: 0 COWS:  COWS Total Score: 0  Musculoskeletal: Strength & Muscle Tone: within normal limits Gait & Station: normal Patient leans: N/A  Psychiatric Specialty Exam: Physical Exam  Nursing note and vitals reviewed. Psychiatric: She has a normal mood and affect. Her speech is normal and behavior is normal. Judgment and thought content normal. Cognition and memory are normal.    Review of Systems  All other systems reviewed and are negative.   Blood pressure 137/74, pulse 64, temperature 98.1 F (  36.7 C), temperature source Oral, resp. rate 16, height 5\' 3"  (1.6 m), weight 110.2 kg (243 lb), SpO2 100 %.Body mass index is 43.05 kg/m.    Have you used any form of tobacco in the last 30 days? (Cigarettes, Smokeless Tobacco, Cigars, and/or Pipes): Yes  Has this patient used any form of tobacco in the last 30 days?  (Cigarettes, Smokeless Tobacco, Cigars, and/or Pipes) Yes, Rx given to patient  Blood Alcohol level:  No results found for: [facility]  Metabolic Disorder Labs:  Lab Results  Component Value Date   HGBA1C 5.6 04/16/2014   MPG 114 04/16/2014   No results found for: PROLACTIN Lab Results  Component Value Date   CHOL 165 07/23/2016   TRIG 101 07/23/2016   HDL 56 07/23/2016   CHOLHDL 2.9 07/23/2016   VLDL 20 07/23/2016   LDLCALC 89 07/23/2016    See Psychiatric Specialty Exam and Suicide Risk Assessment completed by Attending Physician prior to discharge.  Discharge destination:  Home  Is patient on multiple antipsychotic therapies at discharge:  No   Has Patient had three or more failed trials of antipsychotic monotherapy by history:  No  Recommended Plan for Multiple Antipsychotic Therapies: NA     Medication List    STOP taking these medications   hydrochlorothiazide 25 MG tablet Commonly known as:  HYDRODIURIL   zolpidem 10 MG tablet Commonly known as:  AMBIEN     TAKE these medications     Indication  amLODipine 10 MG tablet Commonly known as:  NORVASC Take 1 tablet (10 mg total) by mouth daily. Start taking on:  07/27/2016 What changed:  Another medication with the same name was removed. Continue taking this medication, and follow the directions you see here.  Indication:  High Blood Pressure Disorder   gabapentin 800 MG tablet Commonly known as:  NEURONTIN Take 1 tablet (800 mg total) by mouth 3 (three) times daily.  Indication:  Agitation, Neuropathic Pain   hydrOXYzine 25 MG tablet Commonly known as:  ATARAX/VISTARIL Take 1 tablet (25 mg total) by mouth every 6 (six) hours as needed for anxiety.  Indication:  Anxiety Neurosis   nicotine 7 mg/24hr patch Commonly known as:  NICODERM CQ - dosed in mg/24 hr Place 1 patch (7 mg total) onto the skin daily. Start taking on:  07/27/2016  Indication:  Nicotine Addiction   traZODone 50 MG tablet Commonly known  as:  DESYREL Take 1 tablet (50 mg total) by mouth at bedtime as needed for sleep.  Indication:  Trouble Sleeping   venlafaxine XR 75 MG 24 hr capsule Commonly known as:  EFFEXOR-XR Take 1 capsule (75 mg total) by mouth daily with breakfast. Start taking on:  07/27/2016  Indication:  Major Depressive Disorder      Follow-up Information    [facility] Follow up on 07/27/2016.   Why:  Appt on this date at 12:15PM for hospital follow-up appt. Please bring Photo ID, any insurance information if you have it.  Contact information: [address] [place] [address]           Follow-up recommendations:  Activity:  as tol Diet:  as tol  Comments:  1.  Take all your medications as prescribed.   2.  Report any adverse side effects to outpatient provider. 3.  Patient instructed to not use alcohol or illegal drugs while on prescription medicines. 4.  In the event of worsening symptoms, instructed patient to call 911, the crisis  hotline or go to nearest emergency room for evaluation of symptoms.  Signed: Lindwood Qua, NP [facility] 07/26/2016, 4:09 PM

## 2016-07-26 NOTE — Plan of Care (Signed)
Problem: Activity: Goal: Interest or engagement in activities will improve Outcome: Not Progressing Pt remains in room during group. Minimal staff and peer interaction

## 2016-07-26 NOTE — [facility] Group Notes (Signed)
[facility] LCSW Group Therapy  07/26/2016 2:27 PM  Type of Therapy:  Group Therapy  Participation Level:  Did Not Attend-pt invited. Sleeping in room.   Summary of Progress/Problems: MHA Speaker came to talk about his personal journey with substance abuse and addiction. The pt processed ways by which to relate to the speaker. MHA speaker provided handouts and educational information pertaining to groups and services offered by the [facility].   Genevra Orne N Smart LCSW 07/26/2016, 2:27 PM
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
## 2016-07-26 NOTE — Progress Notes (Signed)
Patient ID: [name], female   DOB: 03/25/1976, 40 y.o.   MRN: [id number]  Pt currently presents with a flat affect and depressed behavior. Pt reports to writer that their goal is to "not be bored, I think I am anxious about tomorrow." Pt states "I don't know where I am going tomorrow, I don't have any resources." Pt reports good sleep with current medication regimen. Pt endorsing right foot pain.   Pt provided with medications per providers orders. Pt's labs and vitals were monitored throughout the night. Pt supported emotionally and encouraged to express concerns and questions. Pt educated on medications and stress reduction techniques.   Pt's safety ensured with 15 minute and environmental checks. Pt currently denies SI/HI and A/V hallucinations. Pt verbally agrees to seek staff if SI/HI or A/VH occurs and to consult with staff before acting on any harmful thoughts. Will continue POC.

## 2016-07-26 NOTE — Progress Notes (Signed)
Recreation Therapy Notes   Animal-Assisted Activity (AAA) Program Checklist/Progress Notes Patient Eligibility Criteria Checklist & Daily Group note for Rec TxIntervention  Date: 12.05.2017 Time: 2:45pm Location: [address]    AAA/T Program Assumption of Risk Form signed by Patient/ or Parent Legal Guardian Yes  Patient is free of allergies or sever asthma Yes  Patient reports no fear of animals Yes  Patient reports no history of cruelty to animals Yes  Patient understands his/her participation is voluntary Yes  Behavioral Response: Did not attend.   Marykay Lex Fredia [place], LRT/CTRS        Aryn Safran L 07/26/2016 3:01 PM

## 2016-07-26 NOTE — [facility] Group Notes (Signed)
The focus of this group is to educate the patient on the purpose and policies of crisis stabilization and provide a format to answer questions about their admission.  The group details unit policies and expectations of patients while admitted.  Patient did not attend 0900 nurse education orientation group this morning.  Patient stayed in room.  

## 2016-07-26 NOTE — Progress Notes (Signed)
Pt presents with sad, flat affect. Isolates to self and room. Did not attend morning groups. Reports sleeping fair with medication. Appetite good, energy level low. Pt rates depression and anxiety at 6, and hopelessness at 4. Denies SI, HI, AVH.  Encouragement and support offered. Medications given as prescribed. Safety checks maintained. Pt remains safe on unit with q 15 min checks.

## 2016-07-26 NOTE — Progress Notes (Signed)
Pt was in bed all day. Pt upset and disappointed that she was declined at [facility], [facility], and [facility] due to sleep apnea diagnosis. Pt given [employer] and shelter list. CSW contacted local shelters-no beds available today. Pt has [facility] outpatient appt in [place] tomorrow at 12:15PM. CSW attempted to speak with pt about discharge plans several times today. Pt under covers and not willing to speak today. Therefore, CSW unable to confirm pt's mode of transportation to appt. (if she does not have a ride home, she can take [employer] bus, but it only runs in the afternoon and she would miss her follow-up appt). CSW and MD to follow-up with pt in the morning.  Trula Slade, MSW, LCSW Clinical Social Worker 07/26/2016 4:07 PM

## 2016-07-26 NOTE — Progress Notes (Signed)
[facility] MD Progress Note  07/26/2016 12:37 PM  Patient Active Problem List   Diagnosis Date Noted  . Chronic renal insufficiency 07/22/2016  . Polysubstance dependence including opioid type drug with complication, episodic abuse (HCC) 07/21/2016  . MDD (major depressive disorder), recurrent severe, without psychosis (HCC) 07/21/2016  . Chronic anemia 07/03/2015  . Chronic pain 07/03/2015  . Diarrhea 05/01/2014  . Essential hypertension, benign 04/17/2014    Diagnosis: Polysubstance use disorder  Subjective: Patient expresses disappointment that she was not able to get into the program she was exploring. Apparently her diagnosis of sleep apnea is a barrier to placement in these programs and they will only accept a note excusing her from the doctor that made the diagnosis. Patient denies any suicidal or homicidal ideation, plan or intent but expresses some disappointment and is not sure what her next step should be. She will be working with Child psychotherapist to identify options and plan is for her to discharge either today or tomorrow based on how this proceeds.  Patient reports "I wish I could have an Ativan" and we discussed again how Ativan is a problematic medication for someone would polysubstance dependence and I recommend that she continue to abstain from controlled substances although of course I will not be in charge of her treatment after discharge.  Objective: Well-developed heavyset woman lying in bed sleeping at approximately 10 AM but easily arousable. Pleasant and appropriate mood is described as being somewhat down and affect is a bit dysphoric as well as sleepy thought processes are linear and goal-directed thought content denies any suicidal or homicidal ideation, plan or intent alert and oriented, IQ appears an average range insight and judgment are fair to limited     Current Facility-Administered Medications (Cardiovascular):  .  amLODipine (NORVASC) tablet 10 mg .  [EXPIRED]  cloNIDine (CATAPRES) tablet 0.1 mg **FOLLOWED BY** [EXPIRED] cloNIDine (CATAPRES) tablet 0.1 mg **FOLLOWED BY** cloNIDine (CATAPRES) tablet 0.1 mg   Current Facility-Administered Medications (Respiratory):  .  guaiFENesin (ROBITUSSIN) 100 MG/5ML solution 100 mg   Current Facility-Administered Medications (Analgesics):  .  acetaminophen (TYLENOL) tablet 650 mg .  ibuprofen (ADVIL,MOTRIN) tablet 600 mg     Current Facility-Administered Medications (Other):  .  alum & mag hydroxide-simeth (MAALOX/MYLANTA) 200-200-20 MG/5ML suspension 30 mL .  gabapentin (NEURONTIN) tablet 800 mg .  hydrOXYzine (ATARAX/VISTARIL) tablet 25 mg .  multivitamin with minerals tablet 1 tablet .  nicotine (NICODERM CQ - dosed in mg/24 hr) patch 7 mg .  thiamine (VITAMIN B-1) tablet 100 mg .  traZODone (DESYREL) tablet 50 mg .  venlafaxine XR (EFFEXOR-XR) 24 hr capsule 75 mg  No current outpatient prescriptions on file.  Vital Signs:Blood pressure 137/74, pulse 64, temperature 98.1 F (36.7 C), temperature source Oral, resp. rate 16, height 5\' 3"  (1.6 m), weight 110.2 kg (243 lb), SpO2 100 %.    Lab Results: No results found for this or any previous visit (from the past 48 hour(s)).  Physical Findings: AIMS: Facial and Oral Movements Muscles of Facial Expression: None, normal Lips and Perioral Area: None, normal Jaw: None, normal Tongue: None, normal,Extremity Movements Upper (arms, wrists, hands, fingers): None, normal Lower (legs, knees, ankles, toes): None, normal, Trunk Movements Neck, shoulders, hips: None, normal, Overall Severity Severity of abnormal movements (highest score from questions above): None, normal Incapacitation due to abnormal movements: None, normal Patient's awareness of abnormal movements (rate only patient's report): No Awareness, Dental Status Current problems with teeth and/or dentures?: No Does patient usually wear  dentures?: No  CIWA:  CIWA-Ar Total: 0 COWS:  COWS  Total Score: 2   Assessment/Plan: Patient expresses disappointment that she was not able to be placed in several programs secondary to health concerns. She will work with the Child psychotherapist as above. Plan is to discharge today or tomorrow if present course continues.  Acquanetta Sit, MD 07/26/2016, 12:37 PM

## 2016-07-27 MED ORDER — TRAZODONE HCL 50 MG PO TABS
50.0000 mg | ORAL_TABLET | Freq: Once | ORAL | Status: AC
  Administered 2016-07-27: 50 mg via ORAL
  Filled 2016-07-27 (×2): qty 1

## 2016-07-27 NOTE — Tx Team (Signed)
Interdisciplinary Treatment and Diagnostic Plan Update  07/27/2016 Time of Session: 9:30AM Judy Robles MRN: [id number]  Principal Diagnosis: Polysubstance dependence including opioid type drug with complication, episodic abuse ([name])  Secondary Diagnoses: Principal Problem:   Polysubstance dependence including opioid type drug with complication, episodic abuse ([name]) Active Problems:   Essential hypertension, benign   MDD (major depressive disorder), recurrent severe, without psychosis ([name])   Chronic renal insufficiency   Current Medications:  Current Facility-Administered Medications  Medication Dose Route Frequency Provider Last Rate Last Dose  . alum & mag hydroxide-simeth (MAALOX/MYLANTA) 200-200-20 MG/5ML suspension 30 mL  30 mL Oral Q4H PRN Ethelene Hal, NP      . amLODipine (NORVASC) tablet 10 mg  10 mg Oral Daily Linard Millers, MD   10 mg at 07/27/16 5027  . cloNIDine (CATAPRES) tablet 0.1 mg  0.1 mg Oral QAC breakfast Ethelene Hal, NP   0.1 mg at 07/26/16 7412  . gabapentin (NEURONTIN) tablet 800 mg  800 mg Oral TID Linard Millers, MD   800 mg at 07/27/16 8786  . guaiFENesin (ROBITUSSIN) 100 MG/5ML solution 100 mg  5 mL Oral Q4H PRN Linard Millers, MD   100 mg at 07/25/16 1300  . hydrOXYzine (ATARAX/VISTARIL) tablet 25 mg  25 mg Oral Q6H PRN Ethelene Hal, NP   25 mg at 07/26/16 1943  . ibuprofen (ADVIL,MOTRIN) tablet 600 mg  600 mg Oral Q6H PRN Rozetta Nunnery, NP   600 mg at 07/26/16 2336  . methocarbamol (ROBAXIN) tablet 500 mg  500 mg Oral Q8H PRN Linard Millers, MD   500 mg at 07/27/16 0044  . multivitamin with minerals tablet 1 tablet  1 tablet Oral Daily Ethelene Hal, NP   1 tablet at 07/27/16 [phone]  . nicotine (NICODERM CQ - dosed in mg/24 hr) patch 7 mg  7 mg Transdermal Daily Linard Millers, MD   7 mg at 07/27/16 0947  . thiamine (VITAMIN B-1) tablet 100 mg  100 mg Oral Daily Ethelene Hal, NP   100  mg at 07/27/16 0962  . traZODone (DESYREL) tablet 50 mg  50 mg Oral QHS PRN Ethelene Hal, NP   50 mg at 07/26/16 2117  . venlafaxine XR (EFFEXOR-XR) 24 hr capsule 75 mg  75 mg Oral Q breakfast Linard Millers, MD   75 mg at 07/27/16 8366   PTA Medications: Prescriptions Prior to Admission  Medication Sig Dispense Refill Last Dose  . amLODipine (NORVASC) 10 MG tablet Take 10 mg by mouth daily.   07/21/2016 at 11am  . hydrochlorothiazide (HYDRODIURIL) 25 MG tablet Take 25 mg by mouth daily.   07/21/2016 at 11am  . venlafaxine XR (EFFEXOR-XR) 75 MG 24 hr capsule Take 75 mg by mouth daily with breakfast.   07/21/2016 at Unknown time  . zolpidem (AMBIEN) 10 MG tablet Take 10 mg by mouth at bedtime.   07/21/2016 at Unknown time  . amLODipine (NORVASC) 2.5 MG tablet Take 1 tablet (2.5 mg total) by mouth daily. (Patient not taking: Reported on 07/22/2016) 30 tablet 0 Not Taking at Unknown time    Patient Stressors: Financial difficulties Health problems Marital or family conflict  Patient Strengths: Ability for insight Average or above average intelligence Capable of independent living Communication skills  Treatment Modalities: Medication Management, Group therapy, Case management,  1 to 1 session with clinician, Psychoeducation, Recreational therapy.   Physician Treatment Plan for Primary Diagnosis: Polysubstance dependence including opioid type  drug with complication, episodic abuse ([name]) Long Term Goal(s): Improvement in symptoms so as ready for discharge Improvement in symptoms so as ready for discharge   Short Term Goals: Ability to demonstrate self-control will improve Ability to identify changes in lifestyle to reduce recurrence of condition will improve Ability to identify triggers associated with substance abuse/mental health issues will improve  Medication Management: Evaluate patient's response, side effects, and tolerance of medication regimen.  Therapeutic  Interventions: 1 to 1 sessions, Unit Group sessions and Medication administration.  Evaluation of Outcomes: Met  Physician Treatment Plan for Secondary Diagnosis: Principal Problem:   Polysubstance dependence including opioid type drug with complication, episodic abuse ([place]) Active Problems:   Essential hypertension, benign   MDD (major depressive disorder), recurrent severe, without psychosis ([name])   Chronic renal insufficiency  Long Term Goal(s): Improvement in symptoms so as ready for discharge Improvement in symptoms so as ready for discharge   Short Term Goals: Ability to demonstrate self-control will improve Ability to identify changes in lifestyle to reduce recurrence of condition will improve Ability to identify triggers associated with substance abuse/mental health issues will improve     Medication Management: Evaluate patient's response, side effects, and tolerance of medication regimen.  Therapeutic Interventions: 1 to 1 sessions, Unit Group sessions and Medication administration.  Evaluation of Outcomes: Met   RN Treatment Plan for Primary Diagnosis: Polysubstance dependence including opioid type drug with complication, episodic abuse ([name]) Long Term Goal(s): Knowledge of disease and therapeutic regimen to maintain health will improve  Short Term Goals: Ability to remain free from injury will improve, Ability to disclose and discuss suicidal ideas and Ability to identify and develop effective coping behaviors will improve  Medication Management: RN will administer medications as ordered by provider, will assess and evaluate patient's response and provide education to patient for prescribed medication. RN will report any adverse and/or side effects to prescribing provider.  Therapeutic Interventions: 1 on 1 counseling sessions, Psychoeducation, Medication administration, Evaluate responses to treatment, Monitor vital signs and CBGs as ordered, Perform/monitor CIWA, COWS,  AIMS and Fall Risk screenings as ordered, Perform wound care treatments as ordered.  Evaluation of Outcomes: Met   LCSW Treatment Plan for Primary Diagnosis: Polysubstance dependence including opioid type drug with complication, episodic abuse ([place]) Long Term Goal(s): Safe transition to appropriate next level of care at discharge, Engage patient in therapeutic group addressing interpersonal concerns.  Short Term Goals: Engage patient in aftercare planning with referrals and resources, Facilitate patient progression through stages of change regarding substance use diagnoses and concerns and Identify triggers associated with mental health/substance abuse issues  Therapeutic Interventions: Assess for all discharge needs, 1 to 1 time with Social worker, Explore available resources and support systems, Assess for adequacy in community support network, Educate family and significant other(s) on suicide prevention, Complete Psychosocial Assessment, Interpersonal group therapy.  Evaluation of Outcomes: Met   Progress in Treatment: Attending groups: intermittently  Participating in groups: minimally, when she attends  Taking medication as prescribed: Yes. Toleration medication: Yes. Family/Significant other contact made: SPE completed with pt; pt declined to consent to family contact.  Patient understands diagnosis: Yes. Discussing patient identified problems/goals with staff: Yes. Medical problems stabilized or resolved: Yes. Denies suicidal/homicidal ideation: No. Issues/concerns per patient self-inventory: Yes. Other: none   New problem(s) identified: No, Describe:  n/a  New Short Term/Long Term Goal(s): medication stabililzation; detox; development of comprehensive mental wellness/sobriety plan.   Discharge Plan or Barriers: No beds available at [facility]. Pt declined at  [facility]-sleep apnea. [facility] not acccepting [place] clients. Pt plans to return home; follow-up with  [facility].   Reason for Continuation of Hospitalization: none  Estimated Length of Stay: d/c today   Attendees: Patient: 07/27/2016 9:51 AM  Physician: Dr. Sharolyn Douglas MD 07/27/2016 9:51 AM  Nursing: Geri Seminole RN 07/27/2016 9:51 AM  RN Care Manager: Lars Pinks CM 07/27/2016 9:51 AM  Social Worker: Maxie Better, LCSW 07/27/2016 9:51 AM  Recreational Therapist:  07/27/2016 9:51 AM  Other: Agustina Caroli NP; Samuel Jester NP 07/27/2016 9:51 AM  Other:  07/27/2016 9:51 AM  Other: 07/27/2016 9:51 AM    Scribe for Treatment Team: [place], LCSW 07/27/2016 9:51 AM

## 2016-07-27 NOTE — Progress Notes (Signed)
  [facility] Adult Case Management Discharge Plan :  Will you be returning to the same living situation after discharge:  Yes,  home At discharge, do you have transportation home?: Yes, friend coming at 1pm  Do you have the ability to pay for your medications: Yes,  mental health  Release of information consent forms completed and submitted to medical records by CSW. Patient to Follow up at: Follow-up Information    [facility] Follow up on 07/28/2016.   Why:  Appt on this date at 9:45AM for hospital follow-up appt. Please bring Photo ID, social security card, any proof of income, and insurance information if you have it. Thank you.  Contact information: [address] [place] [address] [phone]           Next level of care provider has access to [facility] Link:no  Safety Planning and Suicide Prevention discussed: Yes,  SPE completed with pt; pt declined to consent to family contact. SPI pamphlet and Mobile Crisis information provided.   Have you used any form of tobacco in the last 30 days? (Cigarettes, Smokeless Tobacco, Cigars, and/or Pipes): Yes  Has patient been referred to the Quitline?: Patient refused referral  Patient has been referred for addiction treatment: Yes  Ostin Mathey N Smart LCSW 07/27/2016, 9:50 AM

## 2016-07-27 NOTE — Progress Notes (Signed)
Pt d/c from the hospital with her ex husband. All items returned. D/C instructions given and prescriptions given. Pt denies si and hi.

## 2016-07-27 NOTE — Progress Notes (Signed)
Recreation Therapy Notes  Date: 07/27/16 Time: 0930 Location: 300 Dayroom  Group Topic: Stress Management  Goal Area(s) Addresses:  Patient will verbalize importance of using healthy stress management.  Patient will identify positive emotions associated with healthy stress management.   Intervention: Stress Management  Activity :  Guided Imagery.  LRT introduced the stress management technique of guided imagery to the patients.  LRT read a script to allow patients to envision their peaceful place.  Patients were to follow allow with the script to engage in the activity.    Education:  Stress Management, Discharge Planning.   Education Outcome: Acknowledges edcuation/In group clarification offered/Needs additional education  Clinical Observations/Feedback: Pt did not attend group.   Caroll Rancher, LRT/CTRS         Caroll Rancher A 07/27/2016 12:38 PM

## 2016-09-04 ENCOUNTER — Emergency Department (HOSPITAL_COMMUNITY)
Admission: EM | Admit: 2016-09-04 | Discharge: 2016-09-04 | Disposition: A | Discharge status: Home / Self Care | Payer: Medicaid Other | Attending: Emergency Medicine | Admitting: Emergency Medicine

## 2016-09-04 ENCOUNTER — Emergency Department (HOSPITAL_COMMUNITY): Payer: Medicaid Other

## 2016-09-04 ENCOUNTER — Encounter (HOSPITAL_COMMUNITY): Payer: Self-pay | Admitting: Emergency Medicine

## 2016-09-04 DIAGNOSIS — Z5321 Procedure and treatment not carried out due to patient leaving prior to being seen by health care provider: Secondary | ICD-10-CM | POA: Diagnosis not present

## 2016-09-04 DIAGNOSIS — Z760 Encounter for issue of repeat prescription: Secondary | ICD-10-CM | POA: Diagnosis present

## 2016-09-04 DIAGNOSIS — F17210 Nicotine dependence, cigarettes, uncomplicated: Secondary | ICD-10-CM | POA: Diagnosis not present

## 2016-09-04 DIAGNOSIS — I10 Essential (primary) hypertension: Secondary | ICD-10-CM | POA: Insufficient documentation

## 2016-09-04 DIAGNOSIS — R079 Chest pain, unspecified: Secondary | ICD-10-CM | POA: Diagnosis not present

## 2016-09-04 DIAGNOSIS — F419 Anxiety disorder, unspecified: Secondary | ICD-10-CM

## 2016-09-04 DIAGNOSIS — Z765 Malingerer [conscious simulation]: Secondary | ICD-10-CM

## 2016-09-04 DIAGNOSIS — Z7289 Other problems related to lifestyle: Secondary | ICD-10-CM | POA: Insufficient documentation

## 2016-09-04 DIAGNOSIS — G894 Chronic pain syndrome: Secondary | ICD-10-CM

## 2016-09-04 LAB — I-STAT TROPONIN, ED: TROPONIN I, POC: 0.01 ng/mL (ref 0.00–0.08)

## 2016-09-04 LAB — BASIC METABOLIC PANEL
ANION GAP: 11 (ref 5–15)
BUN: 18 mg/dL (ref 6–20)
CALCIUM: 9.2 mg/dL (ref 8.9–10.3)
CO2: 20 mmol/L — ABNORMAL LOW (ref 22–32)
Chloride: 106 mmol/L (ref 101–111)
Creatinine, Ser: 1.09 mg/dL — ABNORMAL HIGH (ref 0.44–1.00)
GFR calc Af Amer: >60 mL/min (ref >60)
Glucose, Bld: 92 mg/dL (ref 65–99)
POTASSIUM: 3.4 mmol/L — AB (ref 3.5–5.1)
SODIUM: 137 mmol/L (ref 135–145)

## 2016-09-04 LAB — CBC
HEMATOCRIT: 34.5 % — AB (ref 36.0–46.0)
Hemoglobin: 11.7 g/dL — ABNORMAL LOW (ref 12.0–15.0)
MCH: 30 pg (ref 26.0–34.0)
MCHC: 33.9 g/dL (ref 30.0–36.0)
MCV: 88.5 fL (ref 78.0–100.0)
Platelets: 267 10*3/uL (ref 150–400)
RBC: 3.9 MIL/uL (ref 3.87–5.11)
RDW: 13.8 % (ref 11.5–15.5)
WBC: 8.3 10*3/uL (ref 4.0–10.5)

## 2016-09-04 MED ORDER — LORAZEPAM 1 MG PO TABS
1.0000 mg | ORAL_TABLET | Freq: Once | ORAL | Status: AC
  Administered 2016-09-04: 1 mg via ORAL
  Filled 2016-09-04: qty 1

## 2016-09-04 MED ORDER — IBUPROFEN 800 MG PO TABS
800.0000 mg | ORAL_TABLET | Freq: Once | ORAL | Status: AC
  Administered 2016-09-04: 800 mg via ORAL
  Filled 2016-09-04: qty 1

## 2016-09-04 MED ORDER — ALBUTEROL SULFATE HFA 108 (90 BASE) MCG/ACT IN AERS
2.0000 | INHALATION_SPRAY | RESPIRATORY_TRACT | Status: DC
  Administered 2016-09-04: 2 via RESPIRATORY_TRACT
  Filled 2016-09-04: qty 6.7

## 2016-09-04 NOTE — ED Notes (Signed)
Called back to recheck vital signs, no answer

## 2016-09-04 NOTE — ED Triage Notes (Addendum)
Per EMS-Pt requesting medication refill. Pt has been out of her anxiety medications x 1 week. Pt is alert, oriented and cooperative. Pt may need occasional verbal redirection

## 2016-09-04 NOTE — ED Triage Notes (Signed)
Pt requests medication refill. Takes: Xanax 1 mg TID, oxycodone 15 mg QID for foot pain, amlodiptine, hydrochlorithiazide.

## 2016-09-04 NOTE — ED Triage Notes (Signed)
1st call for pt to room with no response.

## 2016-09-04 NOTE — Discharge Instructions (Signed)
You must see your Physician for management of your medication.  You should still have medications from your last prescriptions.  Attempting to obtain narcotics by providing false information is a felony.

## 2016-09-04 NOTE — ED Notes (Signed)
2nd call for pt for room with no response.

## 2016-09-04 NOTE — ED Provider Notes (Signed)
[facility]-EMERGENCY DEPT Provider Note   CSN: [id number] Arrival date & time: 09/04/16  1017  By signing my name below, I, Soijett Blue, attest that this documentation has been prepared under the direction and in the presence of Langston Masker, PA-C Electronically Signed: Soijett Blue, ED Scribe. 09/04/16. 12:25 PM.  History   Chief Complaint Chief Complaint  Patient presents with  . Medication Refill    HPI Judy Robles is a 41 y.o. female with a PMHx of HTN, MDD, polysubstance dependence, who presents to the Emergency Department brought in by EMS complaining of medication refill onset 1 week ago. Pt states that 1 week ago she had a panic attack episode that lasted 3 hours with subsequent anxiety. Pt notes that she ran out of her 1 mg xanax that she takes TID, 15 mg oxycodone that she takes QID for right foot fracture, 10 mg amlodipine daily, albuterol inhaler, 150 mg Effexor BID, and 25 mg HCTZ daily. Pt reports that she has had a lot of external stressors recently. She has not tried any medications for the relief for her symptoms. She denies fever, chills, confusion, and any other symptoms.   The history is provided by the patient and the EMS personnel. No language interpreter was used.    Past Medical History:  Diagnosis Date  . Arthritis   . Hypertension   . MRSA (methicillin resistant Staphylococcus aureus)   . Obesity   . Sleep apnea     Patient Active Problem List   Diagnosis Date Noted  . Chronic renal insufficiency 07/22/2016  . Polysubstance dependence including opioid type drug with complication, episodic abuse (HCC) 07/21/2016  . MDD (major depressive disorder), recurrent severe, without psychosis (HCC) 07/21/2016  . Chronic anemia 07/03/2015  . Chronic pain 07/03/2015  . Diarrhea 05/01/2014  . Essential hypertension, benign 04/17/2014    Past Surgical History:  Procedure Laterality Date  . I&D EXTREMITY Right 04/16/2014   Procedure: IRRIGATION AND DEBRIDEMENT  EXTREMITY;  Surgeon: Sharma Covert, MD;  Location: [facility] OR;  Service: Orthopedics;  Laterality: Right;    OB History    No data available       Home Medications    Prior to Admission medications   Medication Sig Start Date End Date Taking? Authorizing Provider  amLODipine (NORVASC) 10 MG tablet Take 1 tablet (10 mg total) by mouth daily. 07/27/16   Adonis Brook, NP  gabapentin (NEURONTIN) 800 MG tablet Take 1 tablet (800 mg total) by mouth 3 (three) times daily. 07/26/16   Adonis Brook, NP  hydrOXYzine (ATARAX/VISTARIL) 25 MG tablet Take 1 tablet (25 mg total) by mouth every 6 (six) hours as needed for anxiety. 07/26/16   Adonis Brook, NP  nicotine (NICODERM CQ - DOSED IN MG/24 HR) 7 mg/24hr patch Place 1 patch (7 mg total) onto the skin daily. 07/27/16   Adonis Brook, NP  traZODone (DESYREL) 50 MG tablet Take 1 tablet (50 mg total) by mouth at bedtime as needed for sleep. 07/26/16   Adonis Brook, NP  venlafaxine XR (EFFEXOR-XR) 75 MG 24 hr capsule Take 1 capsule (75 mg total) by mouth daily with breakfast. 07/27/16   Adonis Brook, NP    Family History Family History  Problem Relation Age of Onset  . Throat cancer Mother   . Hypertension Father   . Stroke Father     Social History Social History  Substance Use Topics  . Smoking status: Current Every Day Smoker    Packs/day: 0.25  Types: Cigarettes  . Smokeless tobacco: Never Used     Comment: 1/2PPD  . Alcohol use No     Allergies   Tylenol [acetaminophen] and Lisinopril   Review of Systems Review of Systems  Constitutional: Negative for chills and fever.  Psychiatric/Behavioral: Negative for confusion. The patient is nervous/anxious.    Physical Exam Updated Vital Signs BP 138/93 (BP Location: Left Arm)   Pulse 85   Resp 16   SpO2 100%   Physical Exam  Constitutional: She is oriented to person, place, and time. She appears well-developed and well-nourished. No distress.  HENT:  Head: Normocephalic  and atraumatic.  Eyes: EOM are normal.  Neck: Neck supple.  Cardiovascular: Normal rate, regular rhythm and normal heart sounds.  Exam reveals no gallop and no friction rub.   No murmur heard. Pulmonary/Chest: Effort normal and breath sounds normal. No respiratory distress. She has no wheezes. She has no rales.  Abdominal: She exhibits no distension.  Musculoskeletal: Normal range of motion.  Neurological: She is alert and oriented to person, place, and time.  Skin: Skin is warm and dry.  Psychiatric: Her behavior is normal. Her mood appears anxious.  Nursing note and vitals reviewed.   ED Treatments / Results  DIAGNOSTIC STUDIES: Oxygen Saturation is 100% on RA, nl by my interpretation.    COORDINATION OF CARE: 12:24 PM Discussed treatment plan with pt at bedside which includes ativan, motrin, and albuterol inhaler and pt agreed to plan.  Procedures Procedures (including critical care time)  Medications Ordered in ED Medications - No data to display   Initial Impression / Assessment and Plan / ED Course  I have reviewed the triage vital signs and the nursing notes.   Clinical Course     Per the [place] controlled substance reporting system, pt refilled her Ambien on 08/24/16, xanax on 08/10/2016, and oxycodone on 08/10/2016. Pt will be given ativan, motrin, and albuterol inhaler while in the ED.  When I asked pt about her prescriptions she reports her sister has her medications.  Pt is seen by Dr. Lerry Liner.  Pt has a history of substance abuse/drug seeking.  Pt has had conviction for forging prescriptions.  I advised pt she should still have 6 days of percocet and ativan. Pt just filled ambien rx. She was given ativan 1 tablet here. Ibuprofen for pain.  Pt reports she is living with her Mother.  Mother will pick her up here after dose of ativan.    Final Clinical Impressions(s) / ED Diagnoses   Final diagnoses:  Chronic pain syndrome  Anxiety  Drug-seeking behavior     New Prescriptions Discharge Medication List as of 09/04/2016 12:37 PM     Pt changed story when advised RX's are not due for renewal and ED is not appropriate place to mange her medications. Pt states she just needs a dose of medication for anxiety.       I personally performed the services in this documentation, which was scribed in my presence.  The recorded information has been reviewed and considered.   Barnet Pall. Elson Areas, PA-C 09/04/16 1736    Lonia Skinner [place], PA-C 09/04/16 1736    Lavera Guise, MD 09/05/16 [phone]
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
## 2016-10-01 ENCOUNTER — Emergency Department (HOSPITAL_COMMUNITY): Payer: Medicaid Other

## 2016-10-01 ENCOUNTER — Emergency Department (HOSPITAL_COMMUNITY)
Admission: EM | Admit: 2016-10-01 | Discharge: 2016-10-02 | Disposition: A | Discharge status: Home / Self Care | Payer: Medicaid Other | Source: Home / Self Care | Attending: Emergency Medicine | Admitting: Emergency Medicine

## 2016-10-01 ENCOUNTER — Encounter (HOSPITAL_COMMUNITY): Payer: Self-pay | Admitting: Emergency Medicine

## 2016-10-01 DIAGNOSIS — Z79899 Other long term (current) drug therapy: Secondary | ICD-10-CM

## 2016-10-01 DIAGNOSIS — I10 Essential (primary) hypertension: Secondary | ICD-10-CM

## 2016-10-01 DIAGNOSIS — F17210 Nicotine dependence, cigarettes, uncomplicated: Secondary | ICD-10-CM | POA: Insufficient documentation

## 2016-10-01 DIAGNOSIS — W06XXXA Fall from bed, initial encounter: Secondary | ICD-10-CM | POA: Insufficient documentation

## 2016-10-01 DIAGNOSIS — M79671 Pain in right foot: Secondary | ICD-10-CM

## 2016-10-01 DIAGNOSIS — Y939 Activity, unspecified: Secondary | ICD-10-CM

## 2016-10-01 DIAGNOSIS — R51 Headache: Secondary | ICD-10-CM

## 2016-10-01 DIAGNOSIS — Y9289 Other specified places as the place of occurrence of the external cause: Secondary | ICD-10-CM

## 2016-10-01 DIAGNOSIS — M25561 Pain in right knee: Secondary | ICD-10-CM

## 2016-10-01 DIAGNOSIS — Y999 Unspecified external cause status: Secondary | ICD-10-CM | POA: Insufficient documentation

## 2016-10-01 DIAGNOSIS — W19XXXA Unspecified fall, initial encounter: Secondary | ICD-10-CM

## 2016-10-01 MED ORDER — NALOXONE HCL 4 MG/0.1ML NA LIQD
1.0000 | Freq: Once | NASAL | Status: AC
  Administered 2016-10-01: 1 via NASAL
  Filled 2016-10-01: qty 4
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
## 2016-10-01 NOTE — ED Notes (Signed)
Received bedside report from [facility]
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
## 2016-10-01 NOTE — ED Provider Notes (Signed)
Care assumed from previous provider PA Gekas. Please see note for further details. Case discussed, plan agreed upon. Briefly, patient is a 41 y.o. female who presents to ED after fall. All imaging reassuring. Hx of foot injury and has been non-compliant with home care and ortho follow up. Patient appears quite drowsy with prior APP evaluation. Has rx for xanax which she took prior to arrival. Will continue to monitor in ED and re-evaluate when patient is more alert. Will need to again stress importance of orthopedic follow up as well.   Nursing staff informs me patient is no longer drowsy and appears back to baseline. Patient re-evaluated. Ambulated to restroom independently. Speech clear. Appears safe for discharge. Requesting pain medication for chronic foot pain. Motrin given. Stressed importance of ortho follow up. All questions answered.    [facility] Ward, PA-C 10/02/16 0037    Cy Blamer, MD 10/02/16 [phone]

## 2016-10-02 MED ORDER — IBUPROFEN 200 MG PO TABS
400.0000 mg | ORAL_TABLET | Freq: Once | ORAL | Status: AC
  Administered 2016-10-02: 400 mg via ORAL
  Filled 2016-10-02: qty 2

## 2016-10-02 NOTE — ED Notes (Signed)
Pt A&O and ambulatory with steady gait.  Waiting for ride in lobby.

## 2016-10-02 NOTE — ED Notes (Signed)
Pt ambulated independently to bathroom w/slightly unsteady gait but reports that its from pain in her R foot and knee.  A&Ox4.  Follows commands and answers questions well.  Asking for pain medication.  VS stable.

## 2016-10-02 NOTE — Discharge Instructions (Signed)
Please call the orthopedist listed to schedule a follow up appointment.  Return to ER for new or worsening symptoms, any additional concerns.

## 2016-10-03 ENCOUNTER — Emergency Department (HOSPITAL_COMMUNITY): Payer: Medicaid Other

## 2016-10-03 ENCOUNTER — Inpatient Hospital Stay (HOSPITAL_COMMUNITY): Payer: Medicaid Other

## 2016-10-03 ENCOUNTER — Encounter (HOSPITAL_COMMUNITY): Payer: Self-pay | Admitting: *Deleted

## 2016-10-03 ENCOUNTER — Inpatient Hospital Stay (HOSPITAL_COMMUNITY)
Admission: EM | Admit: 2016-10-03 | Discharge: 2016-10-08 | DRG: 917 | Disposition: A | Discharge status: Home / Self Care | Payer: Medicaid Other | Attending: Oncology | Admitting: Oncology

## 2016-10-03 DIAGNOSIS — I10 Essential (primary) hypertension: Secondary | ICD-10-CM | POA: Diagnosis present

## 2016-10-03 DIAGNOSIS — J690 Pneumonitis due to inhalation of food and vomit: Secondary | ICD-10-CM | POA: Diagnosis present

## 2016-10-03 DIAGNOSIS — F17210 Nicotine dependence, cigarettes, uncomplicated: Secondary | ICD-10-CM | POA: Diagnosis present

## 2016-10-03 DIAGNOSIS — F1911 Other psychoactive substance abuse, in remission: Secondary | ICD-10-CM

## 2016-10-03 DIAGNOSIS — F1194 Opioid use, unspecified with opioid-induced mood disorder: Secondary | ICD-10-CM | POA: Diagnosis not present

## 2016-10-03 DIAGNOSIS — F419 Anxiety disorder, unspecified: Secondary | ICD-10-CM | POA: Diagnosis present

## 2016-10-03 DIAGNOSIS — Z590 Homelessness: Secondary | ICD-10-CM

## 2016-10-03 DIAGNOSIS — M1990 Unspecified osteoarthritis, unspecified site: Secondary | ICD-10-CM | POA: Diagnosis present

## 2016-10-03 DIAGNOSIS — T1491XA Suicide attempt, initial encounter: Secondary | ICD-10-CM | POA: Diagnosis not present

## 2016-10-03 DIAGNOSIS — R402213 Coma scale, best verbal response, none, at hospital admission: Secondary | ICD-10-CM | POA: Diagnosis present

## 2016-10-03 DIAGNOSIS — Z6841 Body Mass Index (BMI) 40.0 and over, adult: Secondary | ICD-10-CM

## 2016-10-03 DIAGNOSIS — J189 Pneumonia, unspecified organism: Secondary | ICD-10-CM | POA: Diagnosis present

## 2016-10-03 DIAGNOSIS — G4733 Obstructive sleep apnea (adult) (pediatric): Secondary | ICD-10-CM | POA: Diagnosis present

## 2016-10-03 DIAGNOSIS — Z886 Allergy status to analgesic agent status: Secondary | ICD-10-CM | POA: Diagnosis not present

## 2016-10-03 DIAGNOSIS — R042 Hemoptysis: Secondary | ICD-10-CM | POA: Diagnosis present

## 2016-10-03 DIAGNOSIS — F1924 Other psychoactive substance dependence with psychoactive substance-induced mood disorder: Secondary | ICD-10-CM | POA: Diagnosis present

## 2016-10-03 DIAGNOSIS — L8990 Pressure ulcer of unspecified site, unspecified stage: Secondary | ICD-10-CM | POA: Insufficient documentation

## 2016-10-03 DIAGNOSIS — Z800 Family history of malignant neoplasm of digestive organs: Secondary | ICD-10-CM

## 2016-10-03 DIAGNOSIS — G9340 Encephalopathy, unspecified: Secondary | ICD-10-CM | POA: Diagnosis present

## 2016-10-03 DIAGNOSIS — E669 Obesity, unspecified: Secondary | ICD-10-CM | POA: Diagnosis present

## 2016-10-03 DIAGNOSIS — B1920 Unspecified viral hepatitis C without hepatic coma: Secondary | ICD-10-CM | POA: Diagnosis present

## 2016-10-03 DIAGNOSIS — Z808 Family history of malignant neoplasm of other organs or systems: Secondary | ICD-10-CM

## 2016-10-03 DIAGNOSIS — T424X2A Poisoning by benzodiazepines, intentional self-harm, initial encounter: Secondary | ICD-10-CM | POA: Diagnosis present

## 2016-10-03 DIAGNOSIS — L89613 Pressure ulcer of right heel, stage 3: Secondary | ICD-10-CM | POA: Diagnosis present

## 2016-10-03 DIAGNOSIS — Z8249 Family history of ischemic heart disease and other diseases of the circulatory system: Secondary | ICD-10-CM

## 2016-10-03 DIAGNOSIS — F332 Major depressive disorder, recurrent severe without psychotic features: Secondary | ICD-10-CM | POA: Diagnosis not present

## 2016-10-03 DIAGNOSIS — R402323 Coma scale, best motor response, extension, at hospital admission: Secondary | ICD-10-CM | POA: Diagnosis present

## 2016-10-03 DIAGNOSIS — R000 Tachycardia, unspecified: Secondary | ICD-10-CM

## 2016-10-03 DIAGNOSIS — Z79899 Other long term (current) drug therapy: Secondary | ICD-10-CM | POA: Diagnosis not present

## 2016-10-03 DIAGNOSIS — Z8614 Personal history of Methicillin resistant Staphylococcus aureus infection: Secondary | ICD-10-CM

## 2016-10-03 DIAGNOSIS — Z888 Allergy status to other drugs, medicaments and biological substances status: Secondary | ICD-10-CM | POA: Diagnosis not present

## 2016-10-03 DIAGNOSIS — J9602 Acute respiratory failure with hypercapnia: Secondary | ICD-10-CM | POA: Diagnosis present

## 2016-10-03 DIAGNOSIS — Z823 Family history of stroke: Secondary | ICD-10-CM

## 2016-10-03 DIAGNOSIS — J9601 Acute respiratory failure with hypoxia: Secondary | ICD-10-CM | POA: Diagnosis present

## 2016-10-03 DIAGNOSIS — R918 Other nonspecific abnormal finding of lung field: Secondary | ICD-10-CM

## 2016-10-03 DIAGNOSIS — G92 Toxic encephalopathy: Secondary | ICD-10-CM | POA: Diagnosis present

## 2016-10-03 DIAGNOSIS — R402113 Coma scale, eyes open, never, at hospital admission: Secondary | ICD-10-CM | POA: Diagnosis present

## 2016-10-03 DIAGNOSIS — Z79891 Long term (current) use of opiate analgesic: Secondary | ICD-10-CM

## 2016-10-03 DIAGNOSIS — T50902D Poisoning by unspecified drugs, medicaments and biological substances, intentional self-harm, subsequent encounter: Secondary | ICD-10-CM | POA: Diagnosis not present

## 2016-10-03 DIAGNOSIS — F1920 Other psychoactive substance dependence, uncomplicated: Secondary | ICD-10-CM | POA: Diagnosis present

## 2016-10-03 DIAGNOSIS — F11229 Opioid dependence with intoxication, unspecified: Secondary | ICD-10-CM | POA: Diagnosis not present

## 2016-10-03 DIAGNOSIS — F329 Major depressive disorder, single episode, unspecified: Secondary | ICD-10-CM | POA: Diagnosis present

## 2016-10-03 DIAGNOSIS — J181 Lobar pneumonia, unspecified organism: Secondary | ICD-10-CM | POA: Diagnosis not present

## 2016-10-03 DIAGNOSIS — Z915 Personal history of self-harm: Secondary | ICD-10-CM | POA: Diagnosis not present

## 2016-10-03 DIAGNOSIS — G8929 Other chronic pain: Secondary | ICD-10-CM | POA: Diagnosis present

## 2016-10-03 LAB — HEPATIC FUNCTION PANEL
ALK PHOS: 42 U/L (ref 38–126)
ALT: 43 U/L (ref 14–54)
AST: 102 U/L — AB (ref 15–41)
Albumin: 2.3 g/dL — ABNORMAL LOW (ref 3.5–5.0)
BILIRUBIN DIRECT: 0.1 mg/dL (ref 0.1–0.5)
BILIRUBIN TOTAL: 0.5 mg/dL (ref 0.3–1.2)
Indirect Bilirubin: 0.4 mg/dL (ref 0.3–0.9)
Total Protein: 5.3 g/dL — ABNORMAL LOW (ref 6.5–8.1)

## 2016-10-03 LAB — I-STAT ARTERIAL BLOOD GAS, ED
Acid-base deficit: 3 mmol/L — ABNORMAL HIGH (ref 0.0–2.0)
Bicarbonate: 23.9 mmol/L (ref 20.0–28.0)
O2 Saturation: 100 %
PCO2 ART: 53.6 mmHg — AB (ref 32.0–48.0)
Patient temperature: 98.6
TCO2: 25 mmol/L (ref 0–100)
pH, Arterial: 7.257 — ABNORMAL LOW (ref 7.350–7.450)
pO2, Arterial: 304 mmHg — ABNORMAL HIGH (ref 83.0–108.0)

## 2016-10-03 LAB — RAPID URINE DRUG SCREEN, HOSP PERFORMED
AMPHETAMINES: NOT DETECTED
BENZODIAZEPINES: POSITIVE — AB
Barbiturates: NOT DETECTED
COCAINE: NOT DETECTED
Opiates: POSITIVE — AB
TETRAHYDROCANNABINOL: NOT DETECTED

## 2016-10-03 LAB — I-STAT CHEM 8, ED
BUN: 13 mg/dL (ref 6–20)
CHLORIDE: 103 mmol/L (ref 101–111)
Calcium, Ion: 1.1 mmol/L — ABNORMAL LOW (ref 1.15–1.40)
Creatinine, Ser: 1 mg/dL (ref 0.44–1.00)
Glucose, Bld: 121 mg/dL — ABNORMAL HIGH (ref 65–99)
HCT: 35 % — ABNORMAL LOW (ref 36.0–46.0)
Hemoglobin: 11.9 g/dL — ABNORMAL LOW (ref 12.0–15.0)
POTASSIUM: 4.2 mmol/L (ref 3.5–5.1)
SODIUM: 140 mmol/L (ref 135–145)
TCO2: 23 mmol/L (ref 0–100)

## 2016-10-03 LAB — CBC WITH DIFFERENTIAL/PLATELET
BASOS ABS: 0 10*3/uL (ref 0.0–0.1)
Basophils Relative: 0 %
EOS PCT: 0 %
Eosinophils Absolute: 0 10*3/uL (ref 0.0–0.7)
HCT: 35.9 % — ABNORMAL LOW (ref 36.0–46.0)
HEMOGLOBIN: 11.6 g/dL — AB (ref 12.0–15.0)
LYMPHS ABS: 0.9 10*3/uL (ref 0.7–4.0)
LYMPHS PCT: 12 %
MCH: 30.1 pg (ref 26.0–34.0)
MCHC: 32.3 g/dL (ref 30.0–36.0)
MCV: 93.2 fL (ref 78.0–100.0)
Monocytes Absolute: 0.4 10*3/uL (ref 0.1–1.0)
Monocytes Relative: 5 %
NEUTROS PCT: 83 %
Neutro Abs: 6.5 10*3/uL (ref 1.7–7.7)
PLATELETS: 222 10*3/uL (ref 150–400)
RBC: 3.85 MIL/uL — AB (ref 3.87–5.11)
RDW: 13.5 % (ref 11.5–15.5)
WBC: 7.9 10*3/uL (ref 4.0–10.5)

## 2016-10-03 LAB — URINALYSIS, ROUTINE W REFLEX MICROSCOPIC
Bilirubin Urine: NEGATIVE
GLUCOSE, UA: NEGATIVE mg/dL
HGB URINE DIPSTICK: NEGATIVE
Ketones, ur: NEGATIVE mg/dL
Leukocytes, UA: NEGATIVE
Nitrite: NEGATIVE
PH: 5 (ref 5.0–8.0)
Protein, ur: NEGATIVE mg/dL
Specific Gravity, Urine: 1.018 (ref 1.005–1.030)

## 2016-10-03 LAB — CBG MONITORING, ED: Glucose-Capillary: 131 mg/dL — ABNORMAL HIGH (ref 65–99)

## 2016-10-03 LAB — TRIGLYCERIDES: TRIGLYCERIDES: 175 mg/dL — AB (ref <150)

## 2016-10-03 LAB — GLUCOSE, CAPILLARY: Glucose-Capillary: 97 mg/dL (ref 65–99)

## 2016-10-03 LAB — PROCALCITONIN: PROCALCITONIN: 1.2 ng/mL

## 2016-10-03 LAB — ETHANOL: Alcohol, Ethyl (B): <5 mg/dL (ref <5)

## 2016-10-03 LAB — I-STAT CG4 LACTIC ACID, ED: Lactic Acid, Venous: 2.34 mmol/L (ref 0.5–1.9)

## 2016-10-03 LAB — LACTIC ACID, PLASMA: Lactic Acid, Venous: 1.3 mmol/L (ref 0.5–1.9)

## 2016-10-03 LAB — INFLUENZA PANEL BY PCR (TYPE A & B)
Influenza A By PCR: NEGATIVE
Influenza B By PCR: NEGATIVE

## 2016-10-03 LAB — PROTIME-INR
INR: 1.15
Prothrombin Time: 14.7 seconds (ref 11.4–15.2)

## 2016-10-03 LAB — MRSA PCR SCREENING: MRSA by PCR: POSITIVE — AB

## 2016-10-03 LAB — SAMPLE TO BLOOD BANK

## 2016-10-03 LAB — APTT: aPTT: 35 seconds (ref 24–36)

## 2016-10-03 MED ORDER — SODIUM CHLORIDE 0.9 % IV SOLN: 250.0000 mL | INTRAVENOUS | Status: DC | PRN

## 2016-10-03 MED ORDER — FENTANYL CITRATE (PF) 100 MCG/2ML IJ SOLN: 100.0000 ug | INTRAMUSCULAR | Status: DC | PRN

## 2016-10-03 MED ORDER — VANCOMYCIN HCL IN DEXTROSE 1-5 GM/200ML-% IV SOLN
1000.0000 mg | Freq: Three times a day (TID) | INTRAVENOUS | Status: DC
  Administered 2016-10-03 – 2016-10-04 (×3): 1000 mg via INTRAVENOUS
  Filled 2016-10-03 (×4): qty 200

## 2016-10-03 MED ORDER — PIPERACILLIN-TAZOBACTAM 3.375 G IVPB
3.3750 g | Freq: Three times a day (TID) | INTRAVENOUS | Status: DC
  Administered 2016-10-04 – 2016-10-07 (×10): 3.375 g via INTRAVENOUS
  Filled 2016-10-03 (×11): qty 50

## 2016-10-03 MED ORDER — PROPOFOL 1000 MG/100ML IV EMUL: 0.0000 ug/kg/min | INTRAVENOUS | Status: DC

## 2016-10-03 MED ORDER — PROPOFOL 1000 MG/100ML IV EMUL
0.0000 ug/kg/min | INTRAVENOUS | Status: DC
  Administered 2016-10-03: 10 ug/kg/min via INTRAVENOUS
  Filled 2016-10-03: qty 100

## 2016-10-03 MED ORDER — ORAL CARE MOUTH RINSE
15.0000 mL | Freq: Four times a day (QID) | OROMUCOSAL | Status: DC
  Administered 2016-10-03 – 2016-10-04 (×2): 15 mL via OROMUCOSAL

## 2016-10-03 MED ORDER — ETOMIDATE 2 MG/ML IV SOLN
INTRAVENOUS | Status: AC | PRN
  Administered 2016-10-03: 30 mg via INTRAVENOUS

## 2016-10-03 MED ORDER — CALCIUM GLUCONATE 10 % IV SOLN
1.0000 g | Freq: Once | INTRAVENOUS | Status: AC
  Administered 2016-10-03: 1 g via INTRAVENOUS
  Filled 2016-10-03: qty 10

## 2016-10-03 MED ORDER — MUPIROCIN 2 % EX OINT
1.0000 "application " | TOPICAL_OINTMENT | Freq: Two times a day (BID) | CUTANEOUS | Status: AC
  Administered 2016-10-03 – 2016-10-08 (×10): 1 via NASAL
  Filled 2016-10-03 (×5): qty 22

## 2016-10-03 MED ORDER — SODIUM CHLORIDE 0.9 % IV SOLN
INTRAVENOUS | Status: DC
  Administered 2016-10-03: 17:00:00 via INTRAVENOUS

## 2016-10-03 MED ORDER — PROPOFOL 1000 MG/100ML IV EMUL
INTRAVENOUS | Status: AC
  Filled 2016-10-03: qty 100

## 2016-10-03 MED ORDER — FENTANYL CITRATE (PF) 100 MCG/2ML IJ SOLN
100.0000 ug | INTRAMUSCULAR | Status: DC | PRN
  Administered 2016-10-03 – 2016-10-04 (×3): 100 ug via INTRAVENOUS
  Filled 2016-10-03 (×3): qty 2

## 2016-10-03 MED ORDER — CHLORHEXIDINE GLUCONATE CLOTH 2 % EX PADS
6.0000 | MEDICATED_PAD | Freq: Every day | CUTANEOUS | Status: DC
  Administered 2016-10-04 – 2016-10-08 (×4): 6 via TOPICAL

## 2016-10-03 MED ORDER — NALOXONE HCL 0.4 MG/ML IJ SOLN
0.2000 mg | Freq: Once | INTRAMUSCULAR | Status: AC
  Administered 2016-10-03: 0.2 mg via INTRAVENOUS
  Filled 2016-10-03: qty 1

## 2016-10-03 MED ORDER — SODIUM CHLORIDE 0.9 % IV SOLN
2000.0000 mg | Freq: Once | INTRAVENOUS | Status: AC
  Administered 2016-10-03: 2000 mg via INTRAVENOUS
  Filled 2016-10-03: qty 2000

## 2016-10-03 MED ORDER — MIDAZOLAM HCL 2 MG/2ML IJ SOLN: 2.0000 mg | INTRAMUSCULAR | Status: DC | PRN

## 2016-10-03 MED ORDER — CHLORHEXIDINE GLUCONATE 0.12% ORAL RINSE (MEDLINE KIT)
15.0000 mL | Freq: Two times a day (BID) | OROMUCOSAL | Status: DC
  Administered 2016-10-03 – 2016-10-04 (×2): 15 mL via OROMUCOSAL

## 2016-10-03 MED ORDER — SODIUM CHLORIDE 0.9 % IV BOLUS (SEPSIS)
1000.0000 mL | Freq: Once | INTRAVENOUS | Status: AC
  Administered 2016-10-03: 1000 mL via INTRAVENOUS

## 2016-10-03 MED ORDER — PIPERACILLIN-TAZOBACTAM 3.375 G IVPB
3.3750 g | Freq: Three times a day (TID) | INTRAVENOUS | Status: DC
  Administered 2016-10-03: 3.375 g via INTRAVENOUS
  Filled 2016-10-03 (×3): qty 50

## 2016-10-03 MED ORDER — FENTANYL CITRATE (PF) 100 MCG/2ML IJ SOLN
100.0000 ug | INTRAMUSCULAR | Status: DC | PRN
  Administered 2016-10-03 – 2016-10-04 (×2): 100 ug via INTRAVENOUS
  Filled 2016-10-03 (×2): qty 2

## 2016-10-03 MED ORDER — DEXTROSE 5 % IV SOLN: 2.0000 g | Freq: Three times a day (TID) | INTRAVENOUS | Status: DC

## 2016-10-03 MED ORDER — PANTOPRAZOLE SODIUM 40 MG IV SOLR
40.0000 mg | Freq: Every day | INTRAVENOUS | Status: DC
  Administered 2016-10-03: 40 mg via INTRAVENOUS
  Filled 2016-10-03: qty 40

## 2016-10-03 MED ORDER — HEPARIN SODIUM (PORCINE) 5000 UNIT/ML IJ SOLN
5000.0000 [IU] | Freq: Three times a day (TID) | INTRAMUSCULAR | Status: DC
  Administered 2016-10-03 – 2016-10-08 (×16): 5000 [IU] via SUBCUTANEOUS
  Filled 2016-10-03 (×17): qty 1

## 2016-10-03 MED ORDER — IBUPROFEN 100 MG/5ML PO SUSP
600.0000 mg | Freq: Once | ORAL | Status: AC
  Administered 2016-10-04: 600 mg
  Filled 2016-10-03: qty 30

## 2016-10-03 MED ORDER — DEXTROSE 5 % IV SOLN
1.0000 g | Freq: Once | INTRAVENOUS | Status: AC
  Administered 2016-10-03: 1 g via INTRAVENOUS
  Filled 2016-10-03: qty 1

## 2016-10-03 MED ORDER — ROCURONIUM BROMIDE 50 MG/5ML IV SOLN
INTRAVENOUS | Status: AC | PRN
  Administered 2016-10-03: 100 mg via INTRAVENOUS

## 2016-10-03 NOTE — ED Notes (Signed)
Pt is agreeable to being transferred to [facility].

## 2016-10-03 NOTE — ED Notes (Signed)
Pt states she doesn't want Mr. Earlene Plater to be added as her emergency contact or to be made aware of her medical conditions. Mr. Earlene Plater came to the hospital and was requesting information about the pt. He was told we are unable to provide any due to HIPPA.

## 2016-10-03 NOTE — ED Triage Notes (Signed)
Per EMS - pt from rehab facility. Pt coughing up dark red sputum. Denied shortness of breath. SpO2 74% on RA. Placed on 4L [place], SpO2 improved to 94%. Wheezing and rhonchi. Albuterol w/ some relief. Seen yesterday at [facility]. Had xanax filled yesterday, 26 pills missing.
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
## 2016-10-03 NOTE — Progress Notes (Signed)
RT transported pt from ED A12 to 2M12 on ventilator. Pt stable throughout with no complications. VS within normal limits. Bedside report given to receiving RT. RT will continue to monitor.

## 2016-10-03 NOTE — ED Notes (Signed)
Spoke with admitting about putting in orders to admit to [facility] unit.

## 2016-10-03 NOTE — Procedures (Signed)
Intubation Procedure Note Judy Robles [id number] 12/14/1975  Procedure: Intubation Indications: Airway protection and maintenance  Procedure Details Consent: Unable to obtain consent because of altered level of consciousness. Time Out: Verified patient identification, verified procedure, site/side was marked, verified correct patient position, special equipment/implants available, medications/allergies/relevent history reviewed, required imaging and test results available.  Performed  Maximum sterile technique was used including gloves, gown, hand hygiene and mask.  MAC and 3    Evaluation Hemodynamic Status: BP stable throughout; O2 sats: stable throughout Patient's Current Condition: stable Complications: No apparent complications Patient did tolerate procedure well. Chest X-ray ordered to verify placement.  CXR: pending.  Pt intubated using glidescope #3 blade with 7.5 ett secured at 24 at the lip. Positive color change noted on etco2, bilateral BS, direct visualization, CXR pending. Pt stable throughout with no complications. RT will continue to monitor.    Jesse Sans 10/03/2016

## 2016-10-03 NOTE — Progress Notes (Signed)
Pharmacy Antibiotic Note Judy Robles is a 41 y.o. female admitted on 10/03/2016 with concern for aspiration pneumonia.  Pharmacy has been consulted for Zosyn and vancomycin dosing.  SCr 1, nCrCl ~27mL/min  Plan: -Zosyn 3.375 grams IV every 8 hours (infused over 4 hours) -Vancomycin 2g IV x1, then 1g IV q8h -follow c/s, clinical progression, renal function, level PRN    Temp (24hrs), Avg:101.3 F (38.5 C), Min:100.4 F (38 C), Max:102.1 F (38.9 C)   Recent Labs Lab 10/03/16 1001 10/03/16 1029  WBC 7.9  --   CREATININE  --  1.00  LATICACIDVEN  --  2.34*     Antimicrobials this admission: Vancomycin 2/12 >>  Zosyn 2/12 >>  Dose adjustments this admission: n/a  Microbiology results: 2/12 BCx:  2/12 UCx:     Thank you for allowing pharmacy to be a part of this patient's care.  Pollyann Samples, PharmD, BCPS 10/03/2016, 3:21 PM

## 2016-10-03 NOTE — ED Notes (Signed)
Pt is no longer alert and is unable to be aroused by sternal rub. Dr. Rubin Payor notified. Will begin process for intubation.

## 2016-10-03 NOTE — H&P (Signed)
Date: 10/03/2016               Patient Name:  Judy Robles MRN: [id number]  DOB: 1976-02-23 Age / Sex: 41 y.o., female   PCP: Jearld Lesch, MD         Medical Service: Internal Medicine Teaching Service         Attending Physician: Dr. Tyson Alias, MD    First Contact: Dr. Samuella Cota Pager: [phone]  Second Contact: Dr. Karma Greaser Pager: [phone]       After Hours (After 5p/  First Contact Pager: [phone]  weekends / holidays): Second Contact Pager: [phone]   Chief Complaint: cough  History of Present Illness:  Ms. Bennetts is a 41yo female with PMH of depression, h/o substance abuse currently on suboxone presenting [facility] via EMS from rehab facility with hemoptysis; EMS found her to be hypoxic to 74% on RA with improvement to 94% on 4L [place]. Per EDP note, patient had no complaints of shortness of breath. At that time, patient was alert, oriented and participating in conversation. Upon our initial evaluation of patient, she was more somnolent but would arouse to verbal stimuli and answer in 1-2 words; patient was on 4L [place] and satting in low 90's. On second evaluation, patient would at first arouse to painful stimuli, but this regressed and she progressed to NRB to maintain oxygen saturation and had no response to narcan. EDP was called in to facilitate intubation as patient's GCS was 3.  Per triage notes, patient filled Xanax script yesterday (confirmed with [facility] database), 26 pills missing.   Meds PTA:  Xanax 1mg  TID Amlodipine 10mg  daily Gabapentin 800mg  TID Hydroxyzine 25mg  q6hrs PRN Proair PRN Suboxone 8-2mg  film Trazodone 50mg  qhs PRN Venlafaxine 150mg  BID  Allergies: Allergies as of 10/03/2016 - Review Complete 10/01/2016  Allergen Reaction Noted  . Tylenol [acetaminophen] Itching and Other (See Comments) 07/03/2015  . Lisinopril Other (See Comments) 07/22/2016   Past Medical History:  Diagnosis Date  . Arthritis   . Hypertension   . MRSA (methicillin  resistant Staphylococcus aureus)   . Obesity   . Sleep apnea     Family History: Mother with throat cancer; father with hypertension and stroke  Social History: Per chart - current every day 1/2ppd; no illicit drug or alcohol abuse. Previous history of IV drug use  Review of Systems: Unable to obtain due to patient sedation.  Physical Exam: Blood pressure 99/62, pulse 100, temperature 102.1 F (38.9 C), temperature source Rectal, resp. rate 14, SpO2 99 %. General: not alert.  Head: normocephalic and atraumatic.  Eyes: pupils minimally reactive, ~94mm diameter, symmetric  Mouth: dried blood around mouth and nares; unable to visualize oropharynx well  Neck: supple, full passive ROM.  Lungs: bilateral coarse crackles, no wheezing, no increased work of breathing Heart: normal rate, regular rhythm, no murmur, no gallop, and no rubs  Abdomen: soft, non-tender, normal bowel sounds, no distention, no guarding, no rebound tenderness.  Msk: no joint swelling, no joint warmth, and no redness over joints.  Pulses: 2+ bilateral radial pulses, 1+ bilateral DP pulses Extremities: No cyanosis, clubbing. Mild bil LE edema Neurologic: initially alert and oriented per ED RN; at our first evaluation would intermittently respond to verbal stimuli; at second evaluation would not respond to painful stimuli Skin: turgor normal and no rashes.  Psych: altered  LABS: WBC 7.9, Hgb 11.6 (b/l), Hct 25.9, Plts 222 Na 140, K 4.2, Cl 103, BUN 13, Cr  1.00, Glu 121 LA 2.34   CXR: Personally reviewed - Left sided opacity suggestive of pneumonia vs hemorrhage  Assessment & Plan by Problem: Principal Problem:   Acute respiratory failure with hypoxia (HCC) Active Problems:   Acute encephalopathy  Acute respiratory failure with hypoxia, acute encephalopathy: Patient with increasing somnolence and oxygen requirement from first arrival to the ED without administration of sedating meds by the ED. She is on suboxone  therapy and Xanax, which was noted to be down 26 pills within a day and a half of filling. She initially presented with hemoptysis with CXR concerning for CAP vs hemorrhage on the left. Etiology could be xanex overdose with association with aspiration pneumonia possibly. LA 2.3, no leukocytosis; other labs pending. Prior to decline in status, patient was started on IVF and vanc/cefepime for CAP coverage.  Patient was intubated due to concern increasing oxygen requirement and inability to protect airway. PCCM contacted for transfer of care until patient extubated and stable for floor.  --Appreciate PCCM assistance --sent for CXR for tube positioning, abg --BCx sent --labs pending - EtOH, UDS, UA, hepatic panel, APTT, PT  Dispo: Admit patient to Inpatient with expected length of stay greater than 2 midnights. Pt will be admitted to ICU under PCCM; we will resume care once patient is stable for transfer out of ICU.  Signed: Nyra Market, MD 10/03/2016, 3:08 PM  Pager [phone]

## 2016-10-03 NOTE — Progress Notes (Signed)
eLink Physician-Brief Progress Note Patient Name: Judy Robles DOB: [birth year] MRN: [id number]   Date of Service  10/03/2016  HPI/Events of Note  T 102.26F  Note zosyn started earlier today, B opacities on CXR. UA clean. Cx's have been sent. She is allergic to tylenol.   eICU Interventions  Renal fxn checked, no evidence ARF. Will give ibuprofen x 1, follow     Intervention Category Minor Interventions: Routine modifications to care plan (e.g. PRN medications for pain, fever)  Judy Robles S. 10/03/2016, 11:31 PM

## 2016-10-03 NOTE — ED Notes (Signed)
Attempted IV access x2.  ?

## 2016-10-03 NOTE — H&P (Signed)
PULMONARY / CRITICAL CARE MEDICINE   Name: Judy Robles MRN: [id number] DOB: May 02, 1976    ADMISSION DATE:  10/03/2016 CONSULTATION DATE:  10/03/16  REFERRING MD:  Rubin Payor - EDP  CHIEF COMPLAINT:  AMS  HISTORY OF PRESENT ILLNESS:  Pt is encephelopathic; therefore, this HPI is obtained from chart review.  UDS pending.    PAST MEDICAL HISTORY :  She  has a past medical history of Arthritis; Hypertension; MRSA (methicillin resistant Staphylococcus aureus); Obesity; and Sleep apnea.  PAST SURGICAL HISTORY: She  has a past surgical history that includes I&D extremity (Right, 04/16/2014).  Allergies  Allergen Reactions  . Tylenol [Acetaminophen] Itching and Other (See Comments)    whelps  . Lisinopril Other (See Comments)    Shuts down kidney    No current facility-administered medications on file prior to encounter.    Current Outpatient Prescriptions on File Prior to Encounter  Medication Sig  . ALPRAZolam (XANAX) 1 MG tablet Take 1 mg by mouth 3 (three) times daily.  Marland Kitchen amLODipine (NORVASC) 10 MG tablet Take 1 tablet (10 mg total) by mouth daily. (Patient not taking: Reported on 10/01/2016)  . gabapentin (NEURONTIN) 800 MG tablet Take 1 tablet (800 mg total) by mouth 3 (three) times daily.  . hydrOXYzine (ATARAX/VISTARIL) 25 MG tablet Take 1 tablet (25 mg total) by mouth every 6 (six) hours as needed for anxiety. (Patient not taking: Reported on 10/01/2016)  . nicotine (NICODERM CQ - DOSED IN MG/24 HR) 7 mg/24hr patch Place 1 patch (7 mg total) onto the skin daily. (Patient not taking: Reported on 10/01/2016)  . PROAIR HFA 108 (90 Base) MCG/ACT inhaler Inhale 2 puffs into the lungs every 6 (six) hours as needed for wheezing or shortness of breath.   . SUBOXONE 8-2 MG FILM Place 1 Film under the tongue 2 (two) times daily.  . traZODone (DESYREL) 50 MG tablet Take 1 tablet (50 mg total) by mouth at bedtime as needed for sleep. (Patient not taking: Reported on 10/01/2016)  .  venlafaxine (EFFEXOR) 75 MG tablet Take 150 mg by mouth 2 (two) times daily.  Marland Kitchen venlafaxine XR (EFFEXOR-XR) 75 MG 24 hr capsule Take 1 capsule (75 mg total) by mouth daily with breakfast. (Patient not taking: Reported on 10/01/2016)    FAMILY HISTORY:  Her indicated that the status of her mother is unknown. She indicated that the status of her father is unknown.    SOCIAL HISTORY: She  reports that she has been smoking Cigarettes.  She has been smoking about 0.25 packs per day. She has never used smokeless tobacco. She reports that she does not drink alcohol or use drugs.  REVIEW OF SYSTEMS:   Unable to obtain as pt is encephalopathic.  SUBJECTIVE:  On vent, unresponsive.  VITAL SIGNS: BP 104/67   Pulse 104   Temp 102.1 F (38.9 C) (Rectal)   Resp 22   SpO2 93%   HEMODYNAMICS:    VENTILATOR SETTINGS:    INTAKE / OUTPUT: No intake/output data recorded.   PHYSICAL EXAMINATION: General: Adult female, in NAD. Neuro: Sedated, not responsive. HEENT: [place]/AT. PERRL, sclerae anicteric. Cardiovascular: RRR, no M/R/G.  Lungs: Respirations even and unlabored.  Coarse left base. Abdomen: Obese, BS x 4, soft, NT/ND.  Musculoskeletal: No gross deformities, no edema.  Skin: Intact, warm, no rashes.  LABS:  BMET  Recent Labs Lab 10/03/16 1029  NA 140  K 4.2  CL 103  BUN 13  CREATININE 1.00  GLUCOSE 121*  Electrolytes No results for input(s): CALCIUM, MG, PHOS in the last 168 hours.  CBC  Recent Labs Lab 10/03/16 1001 10/03/16 1029  WBC 7.9  --   HGB 11.6* 11.9*  HCT 35.9* 35.0*  PLT 222  --     Coag's No results for input(s): APTT, INR in the last 168 hours.  Sepsis Markers  Recent Labs Lab 10/03/16 1029  LATICACIDVEN 2.34*    ABG  Recent Labs Lab 10/03/16 1437  PHART 7.257*  PCO2ART 53.6*  PO2ART 304.0*    Liver Enzymes No results for input(s): AST, ALT, ALKPHOS, BILITOT, ALBUMIN in the last 168 hours.  Cardiac Enzymes No results for  input(s): TROPONINI, PROBNP in the last 168 hours.  Glucose  Recent Labs Lab 10/03/16 1401  GLUCAP 131*    Imaging Dg Chest Portable 1 View  Result Date: 10/03/2016 CLINICAL DATA:  41 year old presenting with acute onset of cough, shortness of breath and hemoptysis. Current history of hypertension and sleep apnea. Current smoker. EXAM: PORTABLE CHEST 1 VIEW COMPARISON:  09/04/2016, 02/12/2016. FINDINGS: Cardiac silhouette upper normal in size for the AP portable technique, accentuated by the less than optimal inspiration. Asymmetric interstitial and airspace opacities involving the left lung. Right lung clear. Possible left pleural effusion. No pneumothorax. IMPRESSION: Asymmetric interstitial airspace opacities throughout the left lung, likely pneumonia, possibly pulmonary hemorrhage given the history of hemoptysis. Asymmetric pulmonary edema is felt less likely as the right lung is clear. Electronically Signed   By: Hulan Saas M.D.   On: 10/03/2016 10:19     STUDIES:  CXR 2/12 > left base opacities, PNA vs aspiration vs DAH.  CULTURES: Blood 02/12 > Sputum 02/12 >  ANTIBIOTICS: Zosyn 02/12 >  SIGNIFICANT EVENTS: 02/12 > admit.  LINES/TUBES: ETT 02/12 >  DISCUSSION: 41 y.o. female who is in rehab facility for hx polysubstance dependence (on suboxone and xanax).  Presented to [facility] ED 02/11 with right knee and foot pain, rehab facility staff informed ED that pt only had 26 xanax left (90 pills filled just 1 day prior).  She was later discharged, then presented back to [facility] ED 02/12 coughing up red colored sputum.  While in ED, became completely unresponsive with GCS 3; therefore, required intubation for airway protection.  ASSESSMENT / PLAN:  NEUROLOGIC A:   Acute encephalopathy - concern for xanax overdose (had 90 pills filled on 02/10 and as of 02/11, rehab facility informed ED staff that pt only had 26 pills left).  Question whether she has had any additional  ingestion. Hx polysubstance dependence (on suboxone and xanax). P:   Sedation:  Propofol gtt / Fentanyl PRN. RASS goal: 0 to -1. Daily WUA. Assess UDS. Hold preadmission alprazolam, gabapentin, hydroxyzine, suboxone, trazodone, venlafaxine.  PULMONARY A: VDRF - due to inability to protect the airway. PNA (possible aspiration) vs DAH (hx coughing up red colored sputum; Hgb reassuring). Hx OSA - unclear whether on CPAP or not. P:   Full vent support. Wean as able. VAP prevention measures. SBT in AM if neuro status improved. Assess UDS (? Whether hemoptysis could represent crack lung if cocaine +). CXR in AM.  CARDIOVASCULAR A:  Hx HTN. P:  Monitor hemodynamics. Repeat lactate. Hold preadmission amlodipine.  RENAL A:   Hypocalcemia. P:   NS @ 100. 1g Ca gluconate. BMP in AM.  GASTROINTESTINAL A:   GI prophylaxis. Nutrition. Obesity. P:   SUP: Pantoprazole. NPO.  HEMATOLOGIC A:   VTE Prophylaxis. P:  SCD's / heparin. CBC in AM.  INFECTIOUS A:   Possible PNA (aspiration). P:   Abx as above (vanc / zosyn) - cover for HCAP for now given that she is from rehab facility. Follow cultures as above. PCT algorithm to limit abx exposure.  ENDOCRINE A:   No acute issues.   P:   No interventions required.   Family updated: None available.  Interdisciplinary Family Meeting v Palliative Care Meeting:  Due by: 10/09/16.  CC time: 30 min.   Rutherford Guys, [place] - [facility] [place] [facility] Pager: [phone]  or [phone] 10/03/2016, 2:51 PM  ATTENDING NOTE / ATTESTATION NOTE :   I have discussed the case with the resident/APP  Rutherford Guys PA.   I agree with the resident/APP's  history, physical examination, assessment, and plans.    I have edited the above note and modified it according to our agreed history, physical examination, assessment and plan.   Briefly, Judy Robles is a 41 y.o. female with PMH as outlined below.  She presented to [facility] ED 02/12 because she had been coughing up red colored sputum. She had been seen at [facility] ED 1 day prior for right knee and foot pain.  She has a hx of polysubstance abuse (is on suboxone and xanax) and she is apparently at a rehab facility.  Per ED note 02/11, she had gotten 14 suboxone and 90 xanax filled the day prior (02/10) and rehab facility informed EDP that pt had 14 suboxone left but only 26 xanax left.  Pt had told facility that the xanax had been stolen.  In ED, she was initially somnolent but easily arouseable and was fully conversant.  A friend had come to visit her and per RN, friend had asked to get medical information but RN informed him that she could not share any info with him.  Friend then went back into room and then left shortly thereafter.  RN states that roughly 30 minutes later, pt became completely unresponsive with GCS 3 therefore required intubation for airway protection.  No family around at the time of my interview.  Chart review done.    Vitals:  Vitals:   10/03/16 1500 10/03/16 1511 10/03/16 1515 10/03/16 1530  BP: 112/63  101/56 100/60  Pulse: 98  93 90  Resp: 14  22 26   Temp:      TempSrc:      SpO2: 100% 100% 100% 98%    Constitutional/General: well-nourished, well-developed, intubated, sedated, not in any distress  There is no height or weight on file to calculate BMI. Wt Readings from Last 3 Encounters:  10/01/16 110.2 kg (243 lb)  07/02/15 122.7 kg (270 lb 6.4 oz)  05/16/14 117.9 kg (260 lb)    HEENT: PERLA, anicteric sclerae. (-) Oral thrush. Intubated, ETT in place  Neck: No masses. Midline trachea. No JVD, (-) LAD. (-) bruits appreciated.  Respiratory/Chest: Grossly normal chest. (-) deformity. (-) Accessory muscle use.  Symmetric expansion. Diminished BS on both lower lung zones. (-) wheezing, crackles, rhonchi (-) egophony  Cardiovascular: Regular rate and  rhythm, heart sounds normal, no murmur or gallops,  Trace  peripheral edema  Gastrointestinal:  Normal bowel sounds. Soft, non-tender. No hepatosplenomegaly.  (-) masses.   Musculoskeletal:  Normal muscle tone.   Extremities: Grossly normal. (-) clubbing, cyanosis.  (-) edema  Skin: (-) rash,lesions seen.   Neurological/Psychiatric : sedated, intubated. CN grossly intact. (-) lateralizing signs.     CBC Recent Labs  10/03/16  1001  10/03/16  1029  WBC  7.9   --   HGB  11.6*  11.9*  HCT  35.9*  35.0*  PLT  222   --     Coag's No results for input(s): APTT, INR in the last 72 hours.  BMET Recent Labs     10/03/16  1029  NA  140  K  4.2  CL  103  BUN  13  CREATININE  1.00  GLUCOSE  121*    Electrolytes No results for input(s): CALCIUM, MG, PHOS in the last 72 hours.  Sepsis Markers No results for input(s): PROCALCITON, O2SATVEN in the last 72 hours.  Invalid input(s): LACTICACIDVEN  ABG Recent Labs     10/03/16  1437  PHART  7.257*  PCO2ART  53.6*  PO2ART  304.0*    Liver Enzymes No results for input(s): AST, ALT, ALKPHOS, BILITOT, ALBUMIN in the last 72 hours.  Cardiac Enzymes No results for input(s): TROPONINI, PROBNP in the last 72 hours.  Glucose Recent Labs     10/03/16  1401  GLUCAP  131*    Imaging Ct Head Wo Contrast  Result Date: 10/01/2016 CLINICAL DATA:  Pain after fall. EXAM: CT HEAD WITHOUT CONTRAST TECHNIQUE: Contiguous axial images were obtained from the base of the skull through the vertex without intravenous contrast. COMPARISON:  None. FINDINGS: Brain: No evidence of acute infarction, hemorrhage, hydrocephalus, extra-axial collection or mass lesion/mass effect. Vascular: No hyperdense vessel or unexpected calcification. Skull: Normal. Negative for fracture or focal lesion. Sinuses/Orbits: A small amount of fluid is seen in the left maxillary sinus. There is mucosal thickening in the right maxillary sinus. Paranasal sinuses, mastoid air cells, and middle ears are otherwise normal.  Other: None. IMPRESSION: Sinus disease as above.  No acute intracranial abnormality. Electronically Signed   By: Gerome Sam III M.D   On: 10/01/2016 18:38   Dg Chest Portable 1 View  Result Date: 10/03/2016 CLINICAL DATA:  Intubation. Hemoptysis earlier today. Current history of hypertension and MRSA. EXAM: PORTABLE CHEST 1 VIEW 2:37 p.m.: COMPARISON:  Portable chest x-ray earlier today 10:06 a.m. and previously. FINDINGS: Endotracheal tube tip in satisfactory position approximately 3 cm above the carina. Nasogastric tube tip in the fundus of the stomach. Cardiac silhouette normal in size for AP portable technique. Suboptimal inspiration. Improved aeration in the left lung since earlier today, though patchy airspace opacities persist. Right lung remains clear. IMPRESSION: 1. Endotracheal tube tip in satisfactory position approximately 3 cm above the carina. 2. Nasogastric tube tip in the fundus of the stomach. 3. Improved aeration in the left lung since earlier in the day, though patchy airspace opacities persist. 4. Right lung remains clear. Electronically Signed   By: Hulan Saas M.D.   On: 10/03/2016 14:51   Dg Chest Portable 1 View  Result Date: 10/03/2016 CLINICAL DATA:  41 year old presenting with acute onset of cough, shortness of breath and hemoptysis. Current history of hypertension and sleep apnea. Current smoker. EXAM: PORTABLE CHEST 1 VIEW COMPARISON:  09/04/2016, 02/12/2016. FINDINGS: Cardiac silhouette upper normal in size for the AP portable technique, accentuated by the less than optimal inspiration. Asymmetric interstitial and airspace opacities involving the left lung. Right lung clear. Possible left pleural effusion. No pneumothorax. IMPRESSION: Asymmetric interstitial airspace opacities throughout the left lung, likely pneumonia, possibly pulmonary hemorrhage given the history of hemoptysis. Asymmetric pulmonary edema is felt less likely as the right lung is clear.  Electronically Signed   By: Hulan Saas  M.D.   On: 10/03/2016 10:19   Dg Knee Complete 4 Views Right  Result Date: 10/01/2016 CLINICAL DATA:  Knee pain after falling EXAM: RIGHT KNEE - COMPLETE 4+ VIEW COMPARISON:  None. FINDINGS: No evidence of fracture, dislocation, or joint effusion. No evidence of arthropathy or other focal bone abnormality. Soft tissues are unremarkable. IMPRESSION: Negative. Electronically Signed   By: Gerome Sam III M.D   On: 10/01/2016 18:31   Dg Foot Complete Right  Result Date: 10/01/2016 CLINICAL DATA:  Fall out of bed, right foot pain EXAM: RIGHT FOOT COMPLETE - 3+ VIEW COMPARISON:  04/24/2016, 04/09/2016 FINDINGS: Old fracture deformity proximal third proximal phalanx. Stable screw fixation of base of fifth metatarsal fracture. Increased lucency around the head of the screw at the metatarsal base suspicious for loosening. Separation of fracture fragments appears slightly more prominent on the lateral view compared with 04/24/2016 but grossly similar compared to 04/09/2016 IMPRESSION: 1. Old fracture deformity of the proximal third proximal phalanx 2. Status post screw fixation of slightly comminuted base of fifth metatarsal fracture with ununited fracture fragments. Increasing lucency around the head of the screw suspicious for loosening or progressive bone resorption. 3. No definite acute fracture or malalignment is visualized. Electronically Signed   By: Jasmine Pang M.D.   On: 10/01/2016 18:37     Assessment/Plan :  Acute Hypoxemic Hypercapneic respiratory failure 2/2 unable to protect airway.  Concern for xanax overdose.  Not sure if intentional.  Pt with several ED visits recently related to "pain" and " anxiety". Concern for asp PNA / HCAP as well.  - vent support.  - once awake, anticipate we can easily extubate unless with concurrent PNA/infectious process.  - check PCT, follow up CXR. Panculture.  - cont Vanc/Zosyn for now >> If cultures are (-) and  pt is improved, suggest quickly deescalate.   Xanax Overdose.  Concern for Polysubstance Abuse.  Anxiety.  Chronic Pain.  - holding off on pain meds until she wakes up - if necessary, may try fentanyl pushes +/- versed pushes.  - may try precedex drip if she ends up being agitated as well.  - check UDS, ETOH level. - cont IVF - needs a Comptroller. Not sure if intentional OD.  Will need psych evaln.      I spent  30  minutes of Critical Care time with this patient today. This is my time spent independent of the APP or resident.   Family :  No family at bedside.    Pollie Meyer, MD 10/03/2016, 3:33 PM [place] [facility] Pager [phone] After 3 pm or if no answer, call [phone]

## 2016-10-03 NOTE — Progress Notes (Signed)
Pharmacy Antibiotic Note  Judy Robles is a 41 y.o. female admitted on 10/03/2016 with pneumonia.  Pharmacy has been consulted for vancomycin dosing. Also to continue on cefepime for PNA.  SCr 1, nCrCl ~43mL/min  Plan: -cefepime 2g IV q8h per MD -vancomycin 2g IV x1, then 1g IV q8h -follow c/s, clinical progression, renal function, level PRN -follow change of LOT- 8 day course entered    Temp (24hrs), Avg:101.3 F (38.5 C), Min:100.4 F (38 C), Max:102.1 F (38.9 C)   Recent Labs Lab 10/03/16 1001 10/03/16 1029  WBC 7.9  --   CREATININE  --  1.00  LATICACIDVEN  --  2.34*    Estimated Creatinine Clearance: 89 mL/min (by C-G formula based on SCr of 1 mg/dL).    Allergies  Allergen Reactions  . Tylenol [Acetaminophen] Itching and Other (See Comments)    whelps  . Lisinopril Other (See Comments)    Shuts down kidney    Antimicrobials this admission: Vancomycin 2/12 >> (2/20) Cefepime 2/12 >> (2/20)  Dose adjustments this admission: n/a  Microbiology results: 2/12 BCx:  2/12 UCx:     Thank you for allowing pharmacy to be a part of this patient's care.  Rydge Texidor D. Aldo Sondgeroth, PharmD, BCPS Clinical Pharmacist Pager: [phone] 10/03/2016 10:53 AM

## 2016-10-03 NOTE — Sedation Documentation (Signed)
Pt intubated with a positive color change.

## 2016-10-03 NOTE — Progress Notes (Signed)
Pts home medications counted and taken to pharmacy per policy.  At the bedside pt has 1 patient belonging bag that has clothes, 1 black cell phone and 1 black cell phone charger.

## 2016-10-04 ENCOUNTER — Inpatient Hospital Stay (HOSPITAL_COMMUNITY): Payer: Medicaid Other

## 2016-10-04 DIAGNOSIS — G9340 Encephalopathy, unspecified: Secondary | ICD-10-CM

## 2016-10-04 DIAGNOSIS — T50902D Poisoning by unspecified drugs, medicaments and biological substances, intentional self-harm, subsequent encounter: Secondary | ICD-10-CM

## 2016-10-04 DIAGNOSIS — J9601 Acute respiratory failure with hypoxia: Secondary | ICD-10-CM

## 2016-10-04 DIAGNOSIS — I10 Essential (primary) hypertension: Secondary | ICD-10-CM

## 2016-10-04 LAB — BLOOD GAS, ARTERIAL
Acid-base deficit: 1.4 mmol/L (ref 0.0–2.0)
BICARBONATE: 23.2 mmol/L (ref 20.0–28.0)
Drawn by: 31101
FIO2: 40
LHR: 14 {breaths}/min
MECHVT: 440 mL
O2 SAT: 99.5 %
PATIENT TEMPERATURE: 98.6
PEEP: 5 cmH2O
PH ART: 7.362 (ref 7.350–7.450)
PO2 ART: 183 mmHg — AB (ref 83.0–108.0)
pCO2 arterial: 42 mmHg (ref 32.0–48.0)

## 2016-10-04 LAB — CBC
HEMATOCRIT: 27.9 % — AB (ref 36.0–46.0)
HEMOGLOBIN: 8.9 g/dL — AB (ref 12.0–15.0)
MCH: 30.1 pg (ref 26.0–34.0)
MCHC: 31.9 g/dL (ref 30.0–36.0)
MCV: 94.3 fL (ref 78.0–100.0)
Platelets: 165 10*3/uL (ref 150–400)
RBC: 2.96 MIL/uL — AB (ref 3.87–5.11)
RDW: 13.7 % (ref 11.5–15.5)
WBC: 9.9 10*3/uL (ref 4.0–10.5)

## 2016-10-04 LAB — BASIC METABOLIC PANEL
Anion gap: 8 (ref 5–15)
BUN: 11 mg/dL (ref 6–20)
CHLORIDE: 106 mmol/L (ref 101–111)
CO2: 24 mmol/L (ref 22–32)
Calcium: 7.8 mg/dL — ABNORMAL LOW (ref 8.9–10.3)
Creatinine, Ser: 0.95 mg/dL (ref 0.44–1.00)
GFR calc Af Amer: >60 mL/min (ref >60)
GFR calc non Af Amer: >60 mL/min (ref >60)
Glucose, Bld: 114 mg/dL — ABNORMAL HIGH (ref 65–99)
POTASSIUM: 3.9 mmol/L (ref 3.5–5.1)
SODIUM: 138 mmol/L (ref 135–145)

## 2016-10-04 LAB — PROCALCITONIN: Procalcitonin: 1.23 ng/mL

## 2016-10-04 LAB — MAGNESIUM: Magnesium: 1.6 mg/dL — ABNORMAL LOW (ref 1.7–2.4)

## 2016-10-04 LAB — PHOSPHORUS: Phosphorus: 2.6 mg/dL (ref 2.5–4.6)

## 2016-10-04 MED ORDER — FENTANYL CITRATE (PF) 100 MCG/2ML IJ SOLN
50.0000 ug | INTRAMUSCULAR | Status: DC | PRN
  Administered 2016-10-04 – 2016-10-05 (×2): 50 ug via INTRAVENOUS
  Filled 2016-10-04: qty 2

## 2016-10-04 MED ORDER — MAGNESIUM SULFATE 2 GM/50ML IV SOLN
2.0000 g | Freq: Once | INTRAVENOUS | Status: AC
  Administered 2016-10-04: 2 g via INTRAVENOUS
  Filled 2016-10-04: qty 50

## 2016-10-04 MED ORDER — FENTANYL CITRATE (PF) 100 MCG/2ML IJ SOLN
25.0000 ug | INTRAMUSCULAR | Status: DC | PRN
  Administered 2016-10-04: 25 ug via INTRAVENOUS
  Filled 2016-10-04 (×2): qty 2

## 2016-10-04 MED ORDER — IBUPROFEN 400 MG PO TABS
400.0000 mg | ORAL_TABLET | Freq: Four times a day (QID) | ORAL | Status: DC | PRN
  Administered 2016-10-04 – 2016-10-07 (×5): 400 mg via ORAL
  Filled 2016-10-04: qty 1
  Filled 2016-10-04: qty 2
  Filled 2016-10-04 (×3): qty 1

## 2016-10-04 NOTE — Progress Notes (Signed)
[facility] ADULT ICU REPLACEMENT PROTOCOL FOR AM LAB REPLACEMENT ONLY  The patient does apply for the [facility] Adult ICU Electrolyte Replacment Protocol based on the criteria listed below:   1. Is GFR >/= 40 ml/min? Yes.    Patient's GFR today is >60 2. Is urine output >/= 0.5 ml/kg/hr for the last 6 hours? Yes.   Patient's UOP is 0.6 ml/kg/hr 3. Is BUN < 60 mg/dL? Yes.    Patient's BUN today is 11 4. Abnormal electrolyte(s): Mag 1.6 5. Ordered repletion with: protocol 6. If a panic level lab has been reported, has the CCM MD in charge been notified? No..   Physician:    Markus Daft A 10/04/2016 3:23 AM

## 2016-10-04 NOTE — Progress Notes (Signed)
eLink Physician-Brief Progress Note Patient Name: Judy Robles DOB: [birth year] MRN: [id number]   Date of Service  10/04/2016  HPI/Events of Note  Patient c/o pain. Hx of chronic pain.   eICU Interventions  Will order: 1. Increase Fentanyl IV to 50 mcg IV Q 2 hours PRN.      Intervention Category Intermediate Interventions: Pain - evaluation and management  Jeryl Wilbourn Dennard Nip 10/04/2016, 5:56 PM

## 2016-10-04 NOTE — Progress Notes (Signed)
Nutrition Brief Note  Patient identified on the Malnutrition Screening Tool (MST) Report. 7% weight loss within the past year is not significant.  Wt Readings from Last 15 Encounters:  10/04/16 251 lb 15.8 oz (114.3 kg)  10/01/16 243 lb (110.2 kg)  07/02/15 270 lb 6.4 oz (122.7 kg)  05/16/14 260 lb (117.9 kg)  05/01/14 268 lb 11.2 oz (121.9 kg)  04/15/14 270 lb (122.5 kg)    Body mass index is 43.25 kg/m. Patient meets criteria for class 3, extreme/morbid obesity based on current BMI.   Patient was extubated this morning. Current diet order is regular, patient is consuming 100% of meals at this time. Labs and medications reviewed.   No nutrition interventions warranted at this time. If nutrition issues arise, please consult RD.   Joaquin Courts, RD, LDN, CNSC Pager [phone] After Hours Pager [phone]

## 2016-10-04 NOTE — Care Management Note (Signed)
Case Management Note  Patient Details  Name: Judy Robles MRN: [id number] Date of Birth: [birth year]  Subjective/Objective:     Pt admitted with  AMS             Action/Plan:  PTA pt states she was at a treatment/rehab facility for substance abuse - CSW consulted.     Expected Discharge Date:                  Expected Discharge Plan:     In-House Referral:  Clinical Social Work  Discharge planning Services  CM Consult  Post Acute Care Choice:    Choice offered to:     DME Arranged:    DME Agency:     HH Arranged:    HH Agency:     Status of Service:  In process, will continue to follow  If discussed at Long Length of Stay Meetings, dates discussed:    Additional Comments:  Cherylann Parr, RN 10/04/2016, 3:13 PM

## 2016-10-04 NOTE — Progress Notes (Signed)
eLink Physician-Brief Progress Note Patient Name: Judy Robles DOB: 03/25/1976 MRN: [id number]   Date of Service  10/04/2016  HPI/Events of Note  Fever to 102.0 F - Already on Zosyn and pan cultured yesterday. Allergic to Tylenol. Creatinine = 0.95.  eICU Interventions  Will order: 1. Motrin 400 mg PO Q 6 hours PRN Temp > 101.0 F.     Intervention Category Major Interventions: Infection - evaluation and management  Sommer,Steven Eugene 10/04/2016, 7:47 PM
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
## 2016-10-04 NOTE — Procedures (Signed)
Extubation Procedure Note  Patient Details:   Name: BRIDGID PRINTZ DOB: 1975/12/22 MRN: [id number]   Airway Documentation:     Evaluation  O2 sats: stable throughout Complications: No apparent complications Patient did tolerate procedure well. Bilateral Breath Sounds: Rhonchi   Yes  3l/min [place]  Incentive spirometer instructed  Newt Lukes 10/04/2016, 10:27 AM

## 2016-10-04 NOTE — Progress Notes (Signed)
Pt transported to 5W17 with suicide sitter. Pt belongings transported. Upon arrival, nurse at bedside.

## 2016-10-05 ENCOUNTER — Inpatient Hospital Stay (HOSPITAL_COMMUNITY): Payer: Medicaid Other

## 2016-10-05 DIAGNOSIS — Z79899 Other long term (current) drug therapy: Secondary | ICD-10-CM

## 2016-10-05 DIAGNOSIS — T424X1A Poisoning by benzodiazepines, accidental (unintentional), initial encounter: Secondary | ICD-10-CM

## 2016-10-05 DIAGNOSIS — F1120 Opioid dependence, uncomplicated: Secondary | ICD-10-CM

## 2016-10-05 DIAGNOSIS — J690 Pneumonitis due to inhalation of food and vomit: Secondary | ICD-10-CM

## 2016-10-05 DIAGNOSIS — F329 Major depressive disorder, single episode, unspecified: Secondary | ICD-10-CM

## 2016-10-05 DIAGNOSIS — T1491XA Suicide attempt, initial encounter: Secondary | ICD-10-CM

## 2016-10-05 DIAGNOSIS — Z590 Homelessness: Secondary | ICD-10-CM

## 2016-10-05 DIAGNOSIS — F1194 Opioid use, unspecified with opioid-induced mood disorder: Secondary | ICD-10-CM

## 2016-10-05 DIAGNOSIS — F17210 Nicotine dependence, cigarettes, uncomplicated: Secondary | ICD-10-CM

## 2016-10-05 DIAGNOSIS — T424X2A Poisoning by benzodiazepines, intentional self-harm, initial encounter: Principal | ICD-10-CM

## 2016-10-05 DIAGNOSIS — Z765 Malingerer [conscious simulation]: Secondary | ICD-10-CM

## 2016-10-05 DIAGNOSIS — F11229 Opioid dependence with intoxication, unspecified: Secondary | ICD-10-CM

## 2016-10-05 DIAGNOSIS — Z915 Personal history of self-harm: Secondary | ICD-10-CM

## 2016-10-05 DIAGNOSIS — L8990 Pressure ulcer of unspecified site, unspecified stage: Secondary | ICD-10-CM | POA: Insufficient documentation

## 2016-10-05 DIAGNOSIS — F419 Anxiety disorder, unspecified: Secondary | ICD-10-CM

## 2016-10-05 DIAGNOSIS — F332 Major depressive disorder, recurrent severe without psychotic features: Secondary | ICD-10-CM

## 2016-10-05 DIAGNOSIS — Z888 Allergy status to other drugs, medicaments and biological substances status: Secondary | ICD-10-CM

## 2016-10-05 DIAGNOSIS — J181 Lobar pneumonia, unspecified organism: Secondary | ICD-10-CM

## 2016-10-05 LAB — BASIC METABOLIC PANEL
Anion gap: 10 (ref 5–15)
BUN: 10 mg/dL (ref 6–20)
CALCIUM: 8.4 mg/dL — AB (ref 8.9–10.3)
CO2: 23 mmol/L (ref 22–32)
CREATININE: 1.05 mg/dL — AB (ref 0.44–1.00)
Chloride: 105 mmol/L (ref 101–111)
GFR calc Af Amer: >60 mL/min (ref >60)
GFR calc non Af Amer: >60 mL/min (ref >60)
GLUCOSE: 98 mg/dL (ref 65–99)
Potassium: 3.5 mmol/L (ref 3.5–5.1)
Sodium: 138 mmol/L (ref 135–145)

## 2016-10-05 LAB — PHOSPHORUS: PHOSPHORUS: 2.2 mg/dL — AB (ref 2.5–4.6)

## 2016-10-05 LAB — CBC
HCT: 29.6 % — ABNORMAL LOW (ref 36.0–46.0)
Hemoglobin: 9.6 g/dL — ABNORMAL LOW (ref 12.0–15.0)
MCH: 30.2 pg (ref 26.0–34.0)
MCHC: 32.4 g/dL (ref 30.0–36.0)
MCV: 93.1 fL (ref 78.0–100.0)
PLATELETS: 172 10*3/uL (ref 150–400)
RBC: 3.18 MIL/uL — ABNORMAL LOW (ref 3.87–5.11)
RDW: 13.8 % (ref 11.5–15.5)
WBC: 10.1 10*3/uL (ref 4.0–10.5)

## 2016-10-05 LAB — PROCALCITONIN: PROCALCITONIN: 1.85 ng/mL

## 2016-10-05 LAB — MAGNESIUM: Magnesium: 1.9 mg/dL (ref 1.7–2.4)

## 2016-10-05 MED ORDER — DM-GUAIFENESIN ER 30-600 MG PO TB12
1.0000 | ORAL_TABLET | Freq: Two times a day (BID) | ORAL | Status: DC
  Administered 2016-10-05 – 2016-10-08 (×6): 1 via ORAL
  Filled 2016-10-05 (×6): qty 1

## 2016-10-05 MED ORDER — SENNOSIDES-DOCUSATE SODIUM 8.6-50 MG PO TABS
1.0000 | ORAL_TABLET | Freq: Two times a day (BID) | ORAL | Status: DC
  Administered 2016-10-05 – 2016-10-06 (×4): 1 via ORAL
  Filled 2016-10-05 (×4): qty 1

## 2016-10-05 MED ORDER — VENLAFAXINE HCL 75 MG PO TABS
150.0000 mg | ORAL_TABLET | Freq: Two times a day (BID) | ORAL | Status: DC
  Administered 2016-10-05 – 2016-10-08 (×8): 150 mg via ORAL
  Filled 2016-10-05 (×10): qty 2

## 2016-10-05 MED ORDER — ALPRAZOLAM 0.5 MG PO TABS: 0.5000 mg | ORAL_TABLET | Freq: Three times a day (TID) | ORAL | Status: DC | PRN

## 2016-10-05 MED ORDER — BUPRENORPHINE HCL 8 MG SL SUBL
8.0000 mg | SUBLINGUAL_TABLET | Freq: Two times a day (BID) | SUBLINGUAL | Status: DC
  Administered 2016-10-05 – 2016-10-08 (×7): 8 mg via SUBLINGUAL
  Filled 2016-10-05 (×7): qty 1

## 2016-10-05 MED ORDER — CHLORDIAZEPOXIDE HCL 5 MG PO CAPS
10.0000 mg | ORAL_CAPSULE | Freq: Three times a day (TID) | ORAL | Status: DC
  Administered 2016-10-05 (×2): 10 mg via ORAL
  Filled 2016-10-05 (×2): qty 2

## 2016-10-05 NOTE — Progress Notes (Signed)
Pharmacy Antibiotic Note Judy Robles is a 41 y.o. female admitted on 10/03/2016 with concern for aspiration pneumonia.  Pharmacy has been consulted for Zosyn dosing. Renal function is stable. Plan is to change to Augmentin once afebrile; has been afebrile today so far.   Plan: -Continue Zosyn 3.375 grams IV every 8 hours (infused over 4 hours) -Recommend 7 days total of antibiotics -Pharmacy signing off   Height: 5\' 4"  (162.6 cm) Weight: 253 lb 15.5 oz (115.2 kg) IBW/kg (Calculated) : 54.7 Temp (24hrs), Avg:99.4 F (37.4 C), Min:97.7 F (36.5 C), Max:102.7 F (39.3 C)   Recent Labs Lab 10/03/16 1001 10/03/16 1029 10/03/16 1558 10/04/16 0207 10/05/16 0643  WBC 7.9  --   --  9.9 10.1  CREATININE  --  1.00  --  0.95 1.05*  LATICACIDVEN  --  2.34* 1.3  --   --      Thank you for allowing pharmacy to be a part of this patient's care.  10/07/16, PharmD, BCPS Clinical Pharmacist Phone for today [phone] Main pharmacy - [phone] 10/05/2016 1:05 PM

## 2016-10-05 NOTE — Progress Notes (Signed)
   Transfer Summary: Patient initially presented to ED on 10/03/16 with hemoptysis and showed evidence of pneumonia on CXR for which [facility] was contacted for admission. During her ED stay, she became progressively sedated, hypoxic and eventually unresponsive at which time she was intubated as patient was unable to protect her airway, and care was taken over by PCCM. Patient was treated with IV abx for her pneumonia and was successfully extubated 10/04/16 and transferred back to [facility]. The cause of her somnolence is presumed to be benzodiazepine overdose.  Subjective:  This morning, patient endorses continued cough that is not productive, without chest pain or shortness of breath. Otherwise she has no further physical complaints.   We spoke at length about her lack of a support system. She was previously living at a drug rehab facility but she does not want to return to that particular one. Patient is unsure about events surrounding her admission. She admits to prior suicidal ideation but cannot say whether she had these at current presentation. She is also unsure about accidental overdose on Xanax and specifically states that it would be unlikely as her main problem is pain control and addiction to opioids. She continued to have intermittent tearful episodes, perseverated on her relationship with her sister as well as the events surrounding her admission.   Objective:  Vital signs in last 24 hours: Vitals:   10/04/16 2000 10/04/16 2100 10/05/16 0507 10/05/16 0628  BP: (!) 141/63 (!) 135/58 (!) 110/55   Pulse: (!) 117 (!) 109 (!) 102   Resp: (!) 25 (!) 22 18   Temp:  98.5 F (36.9 C) 98.8 F (37.1 C)   TempSrc:  Oral Axillary   SpO2: 97% 93% 97%   Weight:    253 lb 15.5 oz (115.2 kg)  Height:       Constitutional: tearful CV: RRR, no murmurs, rubs or gallops Resp: CTAB, no increased work of breathing, she is oxygenating well on RA, LE edema, pulses intact Abd: soft, +BS,  NDNT  Assessment/Plan:  Principal Problem:   Acute respiratory failure with hypoxia (HCC) Active Problems:   Acute encephalopathy   Pressure injury of skin  Acute respiratory failure with hypoxia and hypercarbia, encephalopathy: Resolved, presumed to be 2/2 Xanax overdose complicated by pneumonia. She is maintaining her oxygen saturations in the 90's on RA.  Pneumonia: Patient with L sided pneumonia. She continues to have nonproductive cough, and had continued to have breakthrough fevers with last one last night to 102.50F.  --continue Zocyn until afebrile then switch to oral abx; Day 3 --continue monitoring for worsening respiratory status  Chronic opioid use disorder: Patient on suboxone therapy prescribed by Dr. Tollie Eth.  --buprenorphine 8mg  BID  Anxiety/MDD: Patient with questionable +/- suicidal ideations, history of suicide attempts. On evaluation today, she is very tearful, tangential, and perseverates on relationship with sister and circumstances surrounding her admission.  --restart home venlafaxine 150mg  daily --restart decreased dose of alprazolam 0.5mg  TID PRN to avoid benzo withdrawal --Psych consulted - appreciate their assistance  HTN: Patient with history of hypertension, previously treated with amlodipine. BP 110/55 this AM. --continue monitoring   Dispo: Anticipated discharge in approximately 2-3 day(s).   , MD 10/05/2016, 10:01 AM Pager [phone]

## 2016-10-05 NOTE — Consult Note (Signed)
[place] [facility] Consult   Reason for Consult:  Intentional overdose and polysubstance abuse Referring Physician:  Dr. Hedwig Morton Patient Identification: ENA DEMARY MRN:  [id number] Principal Diagnosis: CAP (community acquired pneumonia) Diagnosis:   Patient Active Problem List   Diagnosis Date Noted  . Pressure injury of skin [L89.90] 10/05/2016  . CAP (community acquired pneumonia) [J18.9] 10/03/2016  . Chronic renal insufficiency [N18.9] 07/22/2016  . Polysubstance dependence including opioid type drug with complication, episodic abuse ([place]) [B35.329] 07/21/2016  . MDD (major depressive disorder), recurrent severe, without psychosis ([place]) [F33.2] 07/21/2016  . Chronic anemia [D64.9] 07/03/2015  . Chronic pain [G89.29] 07/03/2015  . Diarrhea [R19.7] 05/01/2014  . Essential hypertension, benign [I10] 04/17/2014    Total Time spent with patient: 1 hour  Subjective:   Judy Robles is a 41 y.o. female patient admitted with intentional overdose required intubation on admission.  HPI:  Judy Robles is a 40yo female with PMH of depression, h/o substance abuse currently on suboxone admitted to hospital status post intentional drug overdose and required intubation on admission. Patient seen, chart reviewed and case discussed with the patient, patient's sister and mother who were at bedside with patient verbal permission. Patient reported she was involved with substance abuse rehabilitation program and [place] but unfortunately not able to stay  with the program and continued to be intoxicated with a drug of abuse mostly opioids and benzodiazepines. Reportedly when she contacted her mother and sister who provided $100 for her personal needs but she ended up calling her drug be less on the phone and received no drug of abuse including opioids and benzodiazepines leg Xanax. Patient was upset her son came to the program and taken half of for Xanax. Patient stated she has taken  rest of her medication but denies intention to end her life. Patient endorses she has ongoing polysubstance abuse versus dependence and needed rehabilitation services. Patient has no detox needs. Patient denied any withdrawal symptoms of opioids are linear densities at this time. Review of her medical records indicated patient has been involved with cocaine, Xanax, opioid pills and heroin in the past. Patient also has legal charges for forging prescription medication and has been placed in jail for a few weeks before she was released to the rehabilitation program. Patient was known for stealing from other people to support her drug of abuse. Patient consents to herself homeless at this time. UDS is positive for opiates and benzo's.  Past Psychiatric History: she has multiple acute psych admissions to [facility] and her recent admit was on November 2017 for increased symptoms of depression, suicide ideation and detox treatment.  Risk to Self: Is patient at risk for suicide?: NO Risk to Others:   Prior Inpatient Therapy:   Prior Outpatient Therapy:    Past Medical History:  Past Medical History:  Diagnosis Date  . Arthritis   . Hypertension   . MRSA (methicillin resistant Staphylococcus aureus)   . Obesity   . Sleep apnea     Past Surgical History:  Procedure Laterality Date  . I&D EXTREMITY Right 04/16/2014   Procedure: IRRIGATION AND DEBRIDEMENT EXTREMITY;  Surgeon: Linna Hoff, MD;  Location: [place];  Service: Orthopedics;  Laterality: Right;   Family History:  Family History  Problem Relation Age of Onset  . Throat cancer Mother   . Hypertension Father   . Stroke Father    Family Psychiatric  History: Significant for alcoholism multiple family members and drug of abuse in her young  children 22 and 43 years old who have been in group home and also has legal problems. Social History:  History  Alcohol Use No     History  Drug Use No    Social History   Social History  . Marital  status: Single    Spouse name: N/A  . Number of children: N/A  . Years of education: N/A   Social History Main Topics  . Smoking status: Current Every Day Smoker    Packs/day: 0.25    Types: Cigarettes  . Smokeless tobacco: Never Used     Comment: 1/2PPD  . Alcohol use No  . Drug use: No  . Sexual activity: Yes    Birth control/ protection: Surgical   Other Topics Concern  . None   Social History Narrative  . None   Additional Social History:    Allergies:   Allergies  Allergen Reactions  . Tylenol [Acetaminophen] Itching and Other (See Comments)    Welts  . Lisinopril Other (See Comments)    Shuts down kidneys    Labs:  Results for orders placed or performed during the hospital encounter of 10/03/16 (from the past 48 hour(s))  CBG monitoring, ED     Status: Abnormal   Collection Time: 10/03/16  2:01 PM  Result Value Ref Range   Glucose-Capillary 131 (H) 65 - 99 mg/dL  I-Stat arterial blood gas, ED     Status: Abnormal   Collection Time: 10/03/16  2:37 PM  Result Value Ref Range   pH, Arterial 7.257 (L) 7.350 - 7.450   pCO2 arterial 53.6 (H) 32.0 - 48.0 mmHg   pO2, Arterial 304.0 (H) 83.0 - 108.0 mmHg   Bicarbonate 23.9 20.0 - 28.0 mmol/L   TCO2 25 0 - 100 mmol/L   O2 Saturation 100.0 %   Acid-base deficit 3.0 (H) 0.0 - 2.0 mmol/L   Patient temperature 98.6 F    Collection site RADIAL, ALLEN'S TEST ACCEPTABLE    Drawn by RT    Sample type ARTERIAL   Urine rapid drug screen (hosp performed)     Status: Abnormal   Collection Time: 10/03/16  3:29 PM  Result Value Ref Range   Opiates POSITIVE (A) NONE DETECTED   Cocaine NONE DETECTED NONE DETECTED   Benzodiazepines POSITIVE (A) NONE DETECTED   Amphetamines NONE DETECTED NONE DETECTED   Tetrahydrocannabinol NONE DETECTED NONE DETECTED   Barbiturates NONE DETECTED NONE DETECTED    Comment:        DRUG SCREEN FOR MEDICAL PURPOSES ONLY.  IF CONFIRMATION IS NEEDED FOR ANY PURPOSE, NOTIFY LAB WITHIN 5 DAYS.         LOWEST DETECTABLE LIMITS FOR URINE DRUG SCREEN Drug Class       Cutoff (ng/mL) Amphetamine      1000 Barbiturate      200 Benzodiazepine   500 Tricyclics       938 Opiates          300 Cocaine          300 THC              50   Urinalysis, Routine w reflex microscopic     Status: Abnormal   Collection Time: 10/03/16  3:30 PM  Result Value Ref Range   Color, Urine YELLOW YELLOW   APPearance HAZY (A) CLEAR   Specific Gravity, Urine 1.018 1.005 - 1.030   pH 5.0 5.0 - 8.0   Glucose, UA NEGATIVE NEGATIVE mg/dL   Hgb urine  dipstick NEGATIVE NEGATIVE   Bilirubin Urine NEGATIVE NEGATIVE   Ketones, ur NEGATIVE NEGATIVE mg/dL   Protein, ur NEGATIVE NEGATIVE mg/dL   Nitrite NEGATIVE NEGATIVE   Leukocytes, UA NEGATIVE NEGATIVE  Influenza panel by PCR (type A & B)     Status: None   Collection Time: 10/03/16  3:46 PM  Result Value Ref Range   Influenza A By PCR NEGATIVE NEGATIVE   Influenza B By PCR NEGATIVE NEGATIVE    Comment: (NOTE) The Xpert Xpress Flu assay is intended as an aid in the diagnosis of  influenza and should not be used as a sole basis for treatment.  This  assay is FDA approved for nasopharyngeal swab specimens only. Nasal  washings and aspirates are unacceptable for Xpert Xpress Flu testing.   Ethanol     Status: None   Collection Time: 10/03/16  3:58 PM  Result Value Ref Range   Alcohol, Ethyl (B) <5 <5 mg/dL    Comment:        LOWEST DETECTABLE LIMIT FOR SERUM ALCOHOL IS 5 mg/dL FOR MEDICAL PURPOSES ONLY   Hepatic function panel     Status: Abnormal   Collection Time: 10/03/16  3:58 PM  Result Value Ref Range   Total Protein 5.3 (L) 6.5 - 8.1 g/dL   Albumin 2.3 (L) 3.5 - 5.0 g/dL   AST 102 (H) 15 - 41 U/L   ALT 43 14 - 54 U/L   Alkaline Phosphatase 42 38 - 126 U/L   Total Bilirubin 0.5 0.3 - 1.2 mg/dL   Bilirubin, Direct 0.1 0.1 - 0.5 mg/dL   Indirect Bilirubin 0.4 0.3 - 0.9 mg/dL  APTT     Status: None   Collection Time: 10/03/16  3:58 PM   Result Value Ref Range   aPTT 35 24 - 36 seconds  Protime-INR     Status: None   Collection Time: 10/03/16  3:58 PM  Result Value Ref Range   Prothrombin Time 14.7 11.4 - 15.2 seconds   INR 1.15   Lactic acid, plasma     Status: None   Collection Time: 10/03/16  3:58 PM  Result Value Ref Range   Lactic Acid, Venous 1.3 0.5 - 1.9 mmol/L  Triglycerides     Status: Abnormal   Collection Time: 10/03/16  3:58 PM  Result Value Ref Range   Triglycerides 175 (H) <150 mg/dL  Procalcitonin - Baseline     Status: None   Collection Time: 10/03/16  3:58 PM  Result Value Ref Range   Procalcitonin 1.20 ng/mL    Comment:        Interpretation: PCT > 0.5 ng/mL and <= 2 ng/mL: Systemic infection (sepsis) is possible, but other conditions are known to elevate PCT as well. (NOTE)         ICU PCT Algorithm               Non ICU PCT Algorithm    ----------------------------     ------------------------------         PCT < 0.25 ng/mL                 PCT < 0.1 ng/mL     Stopping of antibiotics            Stopping of antibiotics       strongly encouraged.               strongly encouraged.    ----------------------------     ------------------------------  PCT level decrease by               PCT < 0.25 ng/mL       >= 80% from peak PCT       OR PCT 0.25 - 0.5 ng/mL          Stopping of antibiotics                                             encouraged.     Stopping of antibiotics           encouraged.    ----------------------------     ------------------------------       PCT level decrease by              PCT >= 0.25 ng/mL       < 80% from peak PCT        AND PCT >= 0.5 ng/mL             Continuing antibiotics                                              encouraged.       Continuing antibiotics            encouraged.    ----------------------------     ------------------------------     PCT level increase compared          PCT > 0.5 ng/mL         with peak PCT AND          PCT >= 0.5  ng/mL             Escalation of antibiotics                                          strongly encouraged.      Escalation of antibiotics        strongly encouraged.   Glucose, capillary     Status: None   Collection Time: 10/03/16  4:35 PM  Result Value Ref Range   Glucose-Capillary 97 65 - 99 mg/dL   Comment 1 Notify RN    Comment 2 Document in Chart   MRSA PCR Screening     Status: Abnormal   Collection Time: 10/03/16  4:39 PM  Result Value Ref Range   MRSA by PCR POSITIVE (A) NEGATIVE    Comment:        The GeneXpert MRSA Assay (FDA approved for NASAL specimens only), is one component of a comprehensive MRSA colonization surveillance program. It is not intended to diagnose MRSA infection nor to guide or monitor treatment for MRSA infections. RESULT CALLED TO, READ BACK BY AND VERIFIED WITH: Ferrel Logan RN [id number] 10/03/16 A BROWNING   CBC     Status: Abnormal   Collection Time: 10/04/16  2:07 AM  Result Value Ref Range   WBC 9.9 4.0 - 10.5 K/uL   RBC 2.96 (L) 3.87 - 5.11 MIL/uL   Hemoglobin 8.9 (L) 12.0 - 15.0 g/dL    Comment: REPEATED TO VERIFY DELTA CHECK NOTED    HCT 27.9 (L) 36.0 - 46.0 %  MCV 94.3 78.0 - 100.0 fL   MCH 30.1 26.0 - 34.0 pg   MCHC 31.9 30.0 - 36.0 g/dL   RDW 13.7 11.5 - 15.5 %   Platelets 165 150 - 400 K/uL  Basic metabolic panel     Status: Abnormal   Collection Time: 10/04/16  2:07 AM  Result Value Ref Range   Sodium 138 135 - 145 mmol/L   Potassium 3.9 3.5 - 5.1 mmol/L   Chloride 106 101 - 111 mmol/L   CO2 24 22 - 32 mmol/L   Glucose, Bld 114 (H) 65 - 99 mg/dL   BUN 11 6 - 20 mg/dL   Creatinine, Ser 0.95 0.44 - 1.00 mg/dL   Calcium 7.8 (L) 8.9 - 10.3 mg/dL   GFR calc non Af Amer >60 >60 mL/min   GFR calc Af Amer >60 >60 mL/min    Comment: (NOTE) The eGFR has been calculated using the CKD EPI equation. This calculation has not been validated in all clinical situations. eGFR's persistently <60 mL/min signify possible Chronic  Kidney Disease.    Anion gap 8 5 - 15  Magnesium     Status: Abnormal   Collection Time: 10/04/16  2:07 AM  Result Value Ref Range   Magnesium 1.6 (L) 1.7 - 2.4 mg/dL  Phosphorus     Status: None   Collection Time: 10/04/16  2:07 AM  Result Value Ref Range   Phosphorus 2.6 2.5 - 4.6 mg/dL  Procalcitonin     Status: None   Collection Time: 10/04/16  2:07 AM  Result Value Ref Range   Procalcitonin 1.23 ng/mL    Comment:        Interpretation: PCT > 0.5 ng/mL and <= 2 ng/mL: Systemic infection (sepsis) is possible, but other conditions are known to elevate PCT as well. (NOTE)         ICU PCT Algorithm               Non ICU PCT Algorithm    ----------------------------     ------------------------------         PCT < 0.25 ng/mL                 PCT < 0.1 ng/mL     Stopping of antibiotics            Stopping of antibiotics       strongly encouraged.               strongly encouraged.    ----------------------------     ------------------------------       PCT level decrease by               PCT < 0.25 ng/mL       >= 80% from peak PCT       OR PCT 0.25 - 0.5 ng/mL          Stopping of antibiotics                                             encouraged.     Stopping of antibiotics           encouraged.    ----------------------------     ------------------------------       PCT level decrease by              PCT >= 0.25 ng/mL       <  80% from peak PCT        AND PCT >= 0.5 ng/mL             Continuing antibiotics                                              encouraged.       Continuing antibiotics            encouraged.    ----------------------------     ------------------------------     PCT level increase compared          PCT > 0.5 ng/mL         with peak PCT AND          PCT >= 0.5 ng/mL             Escalation of antibiotics                                          strongly encouraged.      Escalation of antibiotics        strongly encouraged.   Blood gas, arterial      Status: Abnormal   Collection Time: 10/04/16  4:04 AM  Result Value Ref Range   FIO2 40.00    Delivery systems VENTILATOR    Mode PRESSURE REGULATED VOLUME CONTROL    VT 440 mL   LHR 14 resp/min   Peep/cpap 5.0 cm H20   pH, Arterial 7.362 7.350 - 7.450   pCO2 arterial 42.0 32.0 - 48.0 mmHg   pO2, Arterial 183 (H) 83.0 - 108.0 mmHg   Bicarbonate 23.2 20.0 - 28.0 mmol/L   Acid-base deficit 1.4 0.0 - 2.0 mmol/L   O2 Saturation 99.5 %   Patient temperature 98.6    Collection site RIGHT RADIAL    Drawn by [id number]    Sample type ARTERIAL DRAW    Allens test (pass/fail) PASS PASS  Procalcitonin     Status: None   Collection Time: 10/05/16  6:43 AM  Result Value Ref Range   Procalcitonin 1.85 ng/mL    Comment:        Interpretation: PCT > 0.5 ng/mL and <= 2 ng/mL: Systemic infection (sepsis) is possible, but other conditions are known to elevate PCT as well. (NOTE)         ICU PCT Algorithm               Non ICU PCT Algorithm    ----------------------------     ------------------------------         PCT < 0.25 ng/mL                 PCT < 0.1 ng/mL     Stopping of antibiotics            Stopping of antibiotics       strongly encouraged.               strongly encouraged.    ----------------------------     ------------------------------       PCT level decrease by               PCT < 0.25 ng/mL       >= 80% from peak PCT  OR PCT 0.25 - 0.5 ng/mL          Stopping of antibiotics                                             encouraged.     Stopping of antibiotics           encouraged.    ----------------------------     ------------------------------       PCT level decrease by              PCT >= 0.25 ng/mL       < 80% from peak PCT        AND PCT >= 0.5 ng/mL             Continuing antibiotics                                              encouraged.       Continuing antibiotics            encouraged.    ----------------------------     ------------------------------      PCT level increase compared          PCT > 0.5 ng/mL         with peak PCT AND          PCT >= 0.5 ng/mL             Escalation of antibiotics                                          strongly encouraged.      Escalation of antibiotics        strongly encouraged.   Magnesium in AM     Status: None   Collection Time: 10/05/16  6:43 AM  Result Value Ref Range   Magnesium 1.9 1.7 - 2.4 mg/dL  Basic metabolic panel     Status: Abnormal   Collection Time: 10/05/16  6:43 AM  Result Value Ref Range   Sodium 138 135 - 145 mmol/L   Potassium 3.5 3.5 - 5.1 mmol/L   Chloride 105 101 - 111 mmol/L   CO2 23 22 - 32 mmol/L   Glucose, Bld 98 65 - 99 mg/dL   BUN 10 6 - 20 mg/dL   Creatinine, Ser 1.05 (H) 0.44 - 1.00 mg/dL   Calcium 8.4 (L) 8.9 - 10.3 mg/dL   GFR calc non Af Amer >60 >60 mL/min   GFR calc Af Amer >60 >60 mL/min    Comment: (NOTE) The eGFR has been calculated using the CKD EPI equation. This calculation has not been validated in all clinical situations. eGFR's persistently <60 mL/min signify possible Chronic Kidney Disease.    Anion gap 10 5 - 15  CBC     Status: Abnormal   Collection Time: 10/05/16  6:43 AM  Result Value Ref Range   WBC 10.1 4.0 - 10.5 K/uL   RBC 3.18 (L) 3.87 - 5.11 MIL/uL   Hemoglobin 9.6 (L) 12.0 - 15.0 g/dL   HCT 29.6 (L) 36.0 - 46.0 %   MCV 93.1 78.0 - 100.0 fL  MCH 30.2 26.0 - 34.0 pg   MCHC 32.4 30.0 - 36.0 g/dL   RDW 13.8 11.5 - 15.5 %   Platelets 172 150 - 400 K/uL  Phosphorus     Status: Abnormal   Collection Time: 10/05/16  6:43 AM  Result Value Ref Range   Phosphorus 2.2 (L) 2.5 - 4.6 mg/dL    Current Facility-Administered Medications  Medication Dose Route Frequency Provider Last Rate Last Dose  . 0.9 %  sodium chloride infusion  250 mL Intravenous PRN Rahul P Desai, PA-C      . ALPRAZolam Duanne Moron) tablet 0.5 mg  0.5 mg Oral TID PRN Axel Filler, MD      . buprenorphine (SUBUTEX) sublingual tablet 8 mg  8 mg Sublingual BID  Axel Filler, MD   8 mg at 10/05/16 0086  . Chlorhexidine Gluconate Cloth 2 % PADS 6 each  6 each Topical Q0600 Rush Landmark, MD   6 each at 10/04/16 0600  . heparin injection 5,000 Units  5,000 Units Subcutaneous Q8H Rahul P Desai, PA-C   5,000 Units at 10/05/16 0542  . ibuprofen (ADVIL,MOTRIN) tablet 400 mg  400 mg Oral Q6H PRN Anders Simmonds, MD   400 mg at 10/04/16 2012  . mupirocin ointment (BACTROBAN) 2 % 1 application  1 application Nasal BID Jose Shirl Harris, MD   1 application at 76/19/50 [phone]  . piperacillin-tazobactam (ZOSYN) IVPB 3.375 g  3.375 g Intravenous Q8H Axel Filler, MD   3.375 g at 10/05/16 7124  . senna-docusate (Senokot-S) tablet 1 tablet  1 tablet Oral BID Alphonzo Grieve, MD   1 tablet at 10/05/16 1136  . venlafaxine (EFFEXOR) tablet 150 mg  150 mg Oral BID WC Axel Filler, MD        Musculoskeletal: Strength & Muscle Tone: decreased Gait & Station: unable to stand Patient leans: N/A  Psychiatric Specialty Exam: Physical Exam as per history and physical   ROS she has been receiving continuous oxygen by cannula and intravenous antibiotic treatment by for pneumonia. Patient denied nausea, vomiting, abdomen pain. No Fever-chills, No Headache, No changes with Vision or hearing, reports vertigo No problems swallowing food or Liquids, No Chest pain, Cough or Shortness of Breath, No Abdominal pain, No Nausea or Vommitting, Bowel movements are regular, No Blood in stool or Urine, No dysuria, No new skin rashes or bruises, No new joints pains-aches,  No new weakness, tingling, numbness in any extremity, No recent weight gain or loss, No polyuria, polydypsia or polyphagia,   A full 10 point Review of Systems was done, except as stated above, all other Review of Systems were negative.  Blood pressure (!) 110/55, pulse (!) 102, temperature 98.8 F (37.1 C), temperature source Axillary, resp. rate 18, height _0  (1.626 m),  weight 115.2 kg (253 lb 15.5 oz), SpO2 97 %.Body mass index is 43.59 kg/m.  General Appearance: Disheveled and Guarded, morbid obesity   Eye Contact:  Good  Speech:  Clear and Coherent  Volume:  Decreased  Mood:  Anxious and Depressed  Affect:  Constricted and Depressed  Thought Process:  Coherent and Goal Directed  Orientation:  Full (Time, Place, and Person)  Thought Content:  Rumination  Suicidal Thoughts:  No  Homicidal Thoughts:  No  Memory:  Immediate;   Fair Recent;   Poor Remote;   Poor  Judgement:  Impaired  Insight:  Fair  Psychomotor Activity:  Decreased  Concentration:  Concentration: Fair  and Attention Span: Fair  Recall:  Good  Fund of Knowledge:  Good  Language:  Good  Akathisia:  Negative  Handed:  Right  AIMS (if indicated):     Assets:  Communication Skills Desire for Improvement Leisure Time Resilience  ADL's:  Intact  Cognition:  Impaired,  Mild  Sleep:        Treatment Plan Summary: 41 years old female with the polysubstance abuse, Maj. depressive disorder, substance-induced mood disorder, status post intentional drug overdose [Xanax], urine drug screen is positive for opiates and benzodiazepines, fell participate in rehabilitation program and [employer].   Case discussed with the patient and her mother's and sister were at bedside with patient consent Patient denies active suicidal/homicidal ideation, intention or plans.  Patient does not have withdrawal symptoms of it opiates or Xanax at this time does not meet criteria for detox treatment Discontinue alprazolam which patient has been abusing for long time Patient should not be prescribed short-term benzodiazepines or opiates  We provide brief therapy of Librium 10 mg 3 times daily to prevent withdrawal seizures  Continue Effexor XL 225 mg daily for depression , which is maximal limit for depression  Daily contact with patient to assess and evaluate symptoms and progress in treatment and  Medication management  Disposition: Patient will be referred to the residential substance abuse treatment program like [facility] when medically stable. No evidence of imminent risk to self or others at present.   Supportive therapy provided about ongoing stressors.  Ambrose Finland, MD 10/05/2016 12:37 PM

## 2016-10-06 LAB — HIV ANTIBODY (ROUTINE TESTING W REFLEX): HIV Screen 4th Generation wRfx: NONREACTIVE

## 2016-10-06 MED ORDER — SODIUM CHLORIDE 0.9 % IV BOLUS (SEPSIS)
500.0000 mL | Freq: Once | INTRAVENOUS | Status: AC
  Administered 2016-10-06: 500 mL via INTRAVENOUS

## 2016-10-06 NOTE — Progress Notes (Signed)
   Subjective:  No acute events overnight. Patient was appropriately started on Librium taper for prevention of benzo withdrawal; this morning she had slurred speech and appeared slightly sedated, however she was able to converse appropriately, follow commands, and was oriented. She endorses continued nonproductive cough though feels as though there is phlegm she needs to expectorate. She denies shortness of breath, chest pain or other complaints this morning.   Objective:  Vital signs in last 24 hours: Vitals:   10/06/16 0000 10/06/16 0555 10/06/16 0558 10/06/16 0605  BP:  (!) 90/41  (!) 90/56  Pulse:  69    Resp:  18    Temp: 99 F (37.2 C)  98.6 F (37 C)   TempSrc: Oral  Oral   SpO2:  94%    Weight:   253 lb 12.8 oz (115.1 kg)   Height:       Constitutional: slurred speech, appears tired CV: RRR, no murmurs, rubs or gallops Resp: CTAB, no increased work of breathing, she is oxygenating well on RA, LE edema, pulses intact Abd: soft, +BS, NDNT  Assessment/Plan:  Principal Problem:   CAP (community acquired pneumonia) Active Problems:   Polysubstance dependence including opioid type drug with complication, episodic abuse (HCC)   Pressure injury of skin  Pneumonia: Patient with L sided pneumonia. She continues to have nonproductive cough, and had continued to have breakthrough fevers with last one last night to 102F. She remains on room air and lung exam is without rales, rhonchi or wheezing. --continue Zocyn until afebrile then switch to oral abx; Day 4 --continue monitoring for worsening respiratory status  Chronic opioid use disorder: Patient on suboxone therapy prescribed by Dr. Tollie Eth.  --buprenorphine 8mg  BID  Anxiety/MDD: Patient with more stable mood today. Librium taper was started yesterday, however this morning patient appeared oversedated --venlafaxine 150mg  daily, can increase to max 225mg  daily per psych if needed --d/c librium; can add PRN if signs of  benzo withdrawal --Psych consulted - appreciate their assistance - placement in rehab facility  HTN: Patient with history of hypertension, previously treated with amlodipine. --continue monitoring   Dispo: Anticipated discharge in approximately 2-3 day(s).   , MD 10/06/2016, 10:03 AM Pager [phone]

## 2016-10-06 NOTE — Progress Notes (Signed)
Page on call about pt bp, waiting for reply

## 2016-10-07 ENCOUNTER — Telehealth: Payer: Self-pay

## 2016-10-07 ENCOUNTER — Other Ambulatory Visit: Payer: Self-pay | Admitting: Student in an Organized Health Care Education/Training Program

## 2016-10-07 LAB — HEPATITIS B SURFACE ANTIGEN: Hepatitis B Surface Ag: NEGATIVE

## 2016-10-07 LAB — HEPATITIS B CORE ANTIBODY, TOTAL: Hep B Core Total Ab: NEGATIVE

## 2016-10-07 LAB — COMMENT2 - HEP PANEL

## 2016-10-07 LAB — HEPATITIS C ANTIBODY (REFLEX)

## 2016-10-07 LAB — HEPATITIS B SURFACE ANTIBODY,QUALITATIVE: Hep B S Ab: REACTIVE

## 2016-10-07 MED ORDER — BUPRENORPHINE HCL-NALOXONE HCL 8-2 MG SL FILM: 1.0000 | ORAL_FILM | Freq: Two times a day (BID) | SUBLINGUAL | 0 refills | Status: DC

## 2016-10-07 MED ORDER — SENNOSIDES-DOCUSATE SODIUM 8.6-50 MG PO TABS
2.0000 | ORAL_TABLET | Freq: Two times a day (BID) | ORAL | Status: DC
  Administered 2016-10-07 – 2016-10-08 (×3): 2 via ORAL
  Filled 2016-10-07 (×3): qty 2

## 2016-10-07 MED ORDER — AMOXICILLIN-POT CLAVULANATE 875-125 MG PO TABS
1.0000 | ORAL_TABLET | Freq: Two times a day (BID) | ORAL | Status: DC
  Administered 2016-10-07 – 2016-10-08 (×3): 1 via ORAL
  Filled 2016-10-07 (×3): qty 1

## 2016-10-07 NOTE — Progress Notes (Signed)
Patient re-evaluated in afternoon to discuss discharge options. She is still having sedation with episodes of falling asleep but is easily arousable. I do not feel she is safe for discharge today due to this continued sedation. We will re-evaluate tomorrow.  Today, there were no rehab beds available. Patient is agreeable to f/u with [facility] for evaluation for residential rehab with them. She was provided with information for suboxone clinics in the area until she enters rehab. Prior to admission, Dr. Tollie Eth has provided her with suboxone through her rehab facility.  Nyra Market, MD IMTS - PGY1 Pager [phone]

## 2016-10-07 NOTE — Telephone Encounter (Signed)
Hospital TOC per Dr Samuella Cota, discharge 10/07/2016, appt 10/12/2016

## 2016-10-07 NOTE — Clinical Social Work Note (Signed)
CSW met with pt @ bedside for support and to discuss DC planning.   Pt reported to CSW that she was in rehab a week @ [facility] in [place]. CSW unable to find a facility by this name but pt reports she doesn't want to go back and the only facility she would consider is the [facility] in [place]. CSW will check bed availability there.  Pt is ok with doing outpt @ [facility] if [facility] has no openings. Pt was living with her boyfriend in [place] but reports that her boyfriend is back with his wife and she may have to go with his in-laws.  CSW will continue to offer pt support and assist with DC plan.  CSW will continue to follow.  Damaria Vachon B. Joline Maxcy Clinical Social Work Dept Weekend Social Worker [phone] 9:38 AM

## 2016-10-07 NOTE — Progress Notes (Signed)
   Subjective:  No acute events overnight. Patient states her cough is improving and has not other complaints. She is agreeable to go back to rehab facility, even . No signs or symptoms of benzo withdrawal.  Objective:  Vital signs in last 24 hours: Vitals:   10/06/16 1353 10/06/16 2139 10/07/16 0526 10/07/16 0605  BP: (!) 96/48 (!) 100/50 (!) 97/42   Pulse: 76 88 70   Resp: 16 18 18    Temp: 98.3 F (36.8 C) 97.4 F (36.3 C) 98.5 F (36.9 C)   TempSrc:  Oral Oral   SpO2: 94% 100% 99%   Weight:    249 lb 14.4 oz (113.4 kg)  Height:       Constitutional: still a little sleepy CV: RRR, no murmurs, rubs or gallops Resp: CTAB, no increased work of breathing, she is oxygenating well on RA, LE edema, pulses intact Abd: soft, +BS, NDNT  Assessment/Plan:  Principal Problem:   CAP (community acquired pneumonia) Active Problems:   Polysubstance dependence including opioid type drug with complication, episodic abuse (HCC)   Pressure injury of skin  Pneumonia: Patient with L sided pneumonia. She continues to have nonproductive cough that is improving and she has remained afebrile. Exam is reassuring for no rales, wheezes or rhonchi. She has been using her incentive spirometer. --switch to augmentin 875mg  BID; Day 5/7  Chronic opioid use disorder: Patient on suboxone therapy prescribed by Dr. through patient's rehab facility.  --buprenorphine 8mg  BID  Anxiety/MDD: Patient some labile mood today.  --venlafaxine 150mg  daily --Psych consulted - appreciate their assistance - placement in rehab facility  HTN: Patient with history of hypertension, previously treated with amlodipine. --continue monitoring   Dispo: Anticipated discharge possibly today to drug rehab facility if availability; SW aware and will see patient today.   7/7, MD 10/07/2016, 7:50 AM Pager [phone]

## 2016-10-07 NOTE — Clinical Social Work Note (Signed)
CSW contacted [facility], spoke to [place], this is a all female house. Jamal referred SW to [facility] which is a female facility.  CSW contacted [facility] [phone], spoke to Ray and transferred to Pearletha Furl, Dietitian to discuss bed availability. Laurelyn Sickle was not available, CSW left VM with contact information.  CSW will continue to follow.  Chanae Gemma B. Gean Quint Clinical Social Work Dept Weekend Social Worker [phone] 9:51 AM

## 2016-10-08 DIAGNOSIS — T424X5A Adverse effect of benzodiazepines, initial encounter: Secondary | ICD-10-CM

## 2016-10-08 DIAGNOSIS — G92 Toxic encephalopathy: Secondary | ICD-10-CM

## 2016-10-08 DIAGNOSIS — R768 Other specified abnormal immunological findings in serum: Secondary | ICD-10-CM

## 2016-10-08 LAB — CULTURE, BLOOD (ROUTINE X 2)
Culture: NO GROWTH
Culture: NO GROWTH

## 2016-10-08 MED ORDER — SENNOSIDES-DOCUSATE SODIUM 8.6-50 MG PO TABS: 2.0000 | ORAL_TABLET | Freq: Two times a day (BID) | ORAL | 0 refills | Status: DC

## 2016-10-08 MED ORDER — DM-GUAIFENESIN ER 30-600 MG PO TB12: 1.0000 | ORAL_TABLET | Freq: Two times a day (BID) | ORAL | 0 refills | Status: DC

## 2016-10-08 MED ORDER — AMOXICILLIN-POT CLAVULANATE 875-125 MG PO TABS: 1.0000 | ORAL_TABLET | Freq: Two times a day (BID) | ORAL | 0 refills | Status: DC

## 2016-10-08 NOTE — Progress Notes (Signed)
Clinical Social Worker met with patient at bedside to offer support and discuss patients needs at discharge. Patient stated she was staying at [facility] before being admitted to the [facility] contacted facility several times and was unable to get a hold of an employee. Patient asked CSW contact family members patient could stay with before checking her self into [facility] (inpateint substance abuse center). Family members stated that at this time they are unable to house patient. CSW gave patient a list of shelters in the area that she could stay with until she can check herself in [place]. Patient agreeable to discharge to [facility]. Patient was given resource information which include homeless shelters and inpatient/outpatient clinics to help with substance use disorder.   Rhea Pink, MSW,  [place]

## 2016-10-08 NOTE — Progress Notes (Signed)
   Subjective:  No acute events overnight. Patient appears much more alert and awake this morning. She states she was able to sleep through the night. She has no specific complaints this morning.   She is still interested in returning to the rehab facility she came from if bed is still available. If unavailable and no other beds, she is aware of needing to get in contact with either a suboxone clinic or to f/u with [place] on a consistent basis. As no clear plans yet this AM, will f/u this afternoon and lay out our options.   Objective:  Vital signs in last 24 hours: Vitals:   10/07/16 0526 10/07/16 0605 10/07/16 2058 10/08/16 0500  BP: (!) 97/42  (!) 113/46 120/82  Pulse: 70  77 79  Resp: 18  18 17   Temp: 98.5 F (36.9 C)  98.5 F (36.9 C) 98.6 F (37 C)  TempSrc: Oral  Oral Oral  SpO2: 99%  93% 94%  Weight:  249 lb 14.4 oz (113.4 kg)  251 lb 1.7 oz (113.9 kg)  Height:       Constitutional: NAD, appears more awake today CV: RRR, no murmurs, rubs or gallops Resp: CTAB, no increased work of breathing, she is oxygenating well on RA, LE edema, pulses intact Abd: soft, +BS, NDNT  Assessment/Plan:  Principal Problem:   Community acquired pneumonia of left lung Active Problems:   Polysubstance dependence including opioid type drug with complication, episodic abuse (HCC)   Pressure injury of skin  Pneumonia: Resolving cough; exam without rales, wheezes or rhonchi. Saturating well on RA.  --augmentin 875mg  BID; Day 6/7  Chronic opioid use disorder: Patient on suboxone therapy prescribed by Dr. through patient's rehab facility.  --buprenorphine 8mg  BID --waiting on word regarding going back to rehab facility; if unavailable right now, will f/u with [facility] outpt  Anxiety/MDD:  --venlafaxine 150mg  daily  HTN: Patient with history of hypertension, previously treated with amlodipine. She has remained normotensive.   Dispo: Anticipated discharge possibly today to drug rehab  facility if availability; SW aware and will see patient today.   8/7, MD 10/08/2016, 10:40 AM Pager [phone]

## 2016-10-08 NOTE — Discharge Summary (Signed)
Name: Judy Robles MRN: [id number] DOB: 13-May-1976 41 y.o. PCP: Harvie Junior, MD  Date of Admission: 10/03/2016  9:29 AM Date of Discharge: 10/08/2016 Attending Physician: Annia Belt, MD  Discharge Diagnosis: 1. Pneumonia Principal Problem:   Community acquired pneumonia of left lung Active Problems:   Polysubstance dependence including opioid type drug with complication, episodic abuse ([place])   Pressure injury of skin   Discharge Medications: Allergies as of 10/08/2016      Reactions   Tylenol [acetaminophen] Itching, Other (See Comments)   Welts   Lisinopril Other (See Comments)   Shuts down kidneys      Medication List    STOP taking these medications   ALPRAZolam 1 MG tablet Commonly known as:  XANAX   amLODipine 10 MG tablet Commonly known as:  NORVASC   gabapentin 400 MG capsule Commonly known as:  NEURONTIN   gabapentin 800 MG tablet Commonly known as:  NEURONTIN   hydrOXYzine 25 MG tablet Commonly known as:  ATARAX/VISTARIL   traZODone 50 MG tablet Commonly known as:  DESYREL     TAKE these medications   amoxicillin-clavulanate 875-125 MG tablet Commonly known as:  AUGMENTIN Take 1 tablet by mouth every 12 (twelve) hours.   Buprenorphine HCl-Naloxone HCl 8-2 MG Film Commonly known as:  SUBOXONE Place 1 Film under the tongue 2 (two) times daily.   dextromethorphan-guaiFENesin 30-600 MG 12hr tablet Commonly known as:  MUCINEX DM Take 1 tablet by mouth 2 (two) times daily.   nicotine 7 mg/24hr patch Commonly known as:  NICODERM CQ - dosed in mg/24 hr Place 1 patch (7 mg total) onto the skin daily.   PROAIR HFA 108 (90 Base) MCG/ACT inhaler Generic drug:  albuterol Inhale 2 puffs into the lungs every 6 (six) hours as needed for wheezing or shortness of breath.   senna-docusate 8.6-50 MG tablet Commonly known as:  Senokot-S Take 2 tablets by mouth 2 (two) times daily.   venlafaxine 75 MG tablet Commonly known as:   EFFEXOR Take 150 mg by mouth 2 (two) times daily. What changed:  Another medication with the same name was removed. Continue taking this medication, and follow the directions you see here.       Disposition and follow-up:   Judy Robles was discharged from [facility] in Stable condition.  At the hospital follow up visit please address:  1.  Pneumonia: --Has she finished her course of augmentin? Last day 2/18 --Is she having chills, fevers, productive cough?  Chronic opioid use: --has she established with suboxone clinic or looked into rehabs? --Has she followed up with [facility] to pursue residential rehab? --we have provided her with 1 week of suboxone 39m TID  Anxiety: --has patient taken further xanax, gabapentin, trazodone or other sedative medications?  Hep C: Patient tested positive for Hep C Ab and RNA. --please refer to infectious disease for possible treatment  HTN: Patient in past was on amlodipine and HCTZ. BP's low-normal throughout admission so meds have been held and not restarted. --assess BP for need for treatment.   2.  Labs / imaging needed at time of follow-up: None  3.  Pending labs/ test needing follow-up: None  Follow-up Appointments: Follow-up Information    [place] 10[facility].   Contact information: [id number]. Schedule an appointment as soon as possible for a visit.  Contact information: [address] [place] [address] [phone]           Hospital Course by problem list: Principal Problem:   Community acquired pneumonia of left lung Active Problems:   Polysubstance dependence including opioid type drug with complication, episodic abuse ([place])   Pressure injury of skin   Judy Robles is a 41yo female with PMH of chronic opioid abuse, HTN, depression presented to the [facility] with hemoptysis  and was found to have left sided pneumonia. Patient became progressively somnolent, likely due to Xanax overdose, to the point where intubation was necessary to ensure protection of her airway. She was successfully extubated and continued to be treated for pneumonia.  Encephalopathy 2/2 toxin ingestion: After arrival to the ED, patient became progressively somnolent, requiring intubation to protect her airway. She was managed by critical care and successfully extubated on Day 2, and weaned off of supplemental oxygen shortly afterwards. It is thought that her encephalopathy was due to overdose on Xanax.   Pneumonia: Patient initially presented with hemoptysis, and was found to have a left sided pneumonia. She was placed on IV antibiotics and transitioned to Augmentin once extubated, afebrile x 24hr. Post-extubation she did not require supplemental oxygen. She responded well to antibiotics and just had a lingering nonproductive cough which was treated supportively. At discharge, patient had 3 more doses of Augmentin to end on 2/18 for a full 7 day course.   Chronic opioid abuse, polysubstance abuse: Patient with presumed Xanax overdose as she had >25 pills missing from newly filled Xanax. Patient maintains she did not intentionally overdose. Psychiatry evaluated patient and recommended she return to residential rehab and [facility] if possible to also address her anxiety and depression. Patient was in residential rehab prior to admission undergoing suboxone treatment. While inpatient and out of the ICU, she was provided with buprenorphine. Once out of the ICU, she was started on Librium taper, but the following morning was found to be drowsy so this was stopped. She did not display benzo withdrawal symptoms. We were trying to place patient back into her rehab or another similar facility at discharge, but no beds were available. She was given list of shelters and suboxone clinics with which she could establish  with the help from social work. She was given information for setting up outpatient appointment with [facility] for assessment and possible subsequent admission for residential rehab. She was also given one week script for suboxone until she is able to establish. She was advised to discontinue any sedative medication.  HTN: Patient on home amlodipine and HCTZ which she was not taking. Her blood pressures were low-normal so she was not restarted on any anti-hypertensives.   Hep C: Patient had screening HIV, Hep B and Hep C on admission. HIV was negative, she has immunity to Hep B; she was positive for Hep C antibodies, and was RNA positive. Patient is interested in pursuing treatment. She will need to be referred to ID or GI for treatment of Hep C.   Discharge Vitals:   BP 120/82 (BP Location: Right Arm)   Pulse 79   Temp 98.6 F (37 C) (Oral)   Resp 17   Ht '5\' 4"'$  (1.626 m)   Wt 251 lb 1.7 oz (113.9 kg)   SpO2 94%   BMI 43.10 kg/m   Pertinent Labs, Studies, and Procedures:  CBC Latest Ref Rng & Units 10/09/2016 10/05/2016 10/04/2016  WBC 4.0 - 10.5 K/uL 6.5 10.1 9.9  Hemoglobin 12.0 -  15.0 g/dL 8.8(L) 9.6(L) 8.9(L)  Hematocrit 36.0 - 46.0 % 27.0(L) 29.6(L) 27.9(L)  Platelets 150 - 400 K/uL 229 172 165   CMP Latest Ref Rng & Units 10/09/2016 10/05/2016 10/04/2016  Glucose 65 - 99 mg/dL 82 98 114(H)  BUN 6 - 20 mg/dL 13 10 11   Creatinine 0.44 - 1.00 mg/dL 1.07(H) 1.05(H) 0.95  Sodium 135 - 145 mmol/L 136 138 138  Potassium 3.5 - 5.1 mmol/L 4.3 3.5 3.9  Chloride 101 - 111 mmol/L 104 105 106  CO2 22 - 32 mmol/L 22 23 24   Calcium 8.9 - 10.3 mg/dL 8.6(L) 8.4(L) 7.8(L)  Total Protein 6.5 - 8.1 g/dL - - -  Total Bilirubin 0.3 - 1.2 mg/dL - - -  Alkaline Phos 38 - 126 U/L - - -  AST 15 - 41 U/L - - -  ALT 14 - 54 U/L - - -   Blood Culture    Component Value Date/Time   SDES BLOOD LEFT ANTECUBITAL 10/03/2016 1022   SPECREQUEST BOTTLES DRAWN AEROBIC AND ANAEROBIC  5CC 10/03/2016 1022   CULT  NO GROWTH 5 DAYS 10/03/2016 1022   REPTSTATUS 10/08/2016 FINAL 10/03/2016 1022   Hepatic Function Latest Ref Rng & Units 10/03/2016 07/02/2015 04/16/2014  Total Protein 6.5 - 8.1 g/dL 5.3(L) 6.4(L) 7.0  Albumin 3.5 - 5.0 g/dL 2.3(L) 2.7(L) 2.4(L)  AST 15 - 41 U/L 102(H) 33 42(H)  ALT 14 - 54 U/L 43 23 30  Alk Phosphatase 38 - 126 U/L 42 44 59  Total Bilirubin 0.3 - 1.2 mg/dL 0.5 0.4 <0.2(L)  Bilirubin, Direct 0.1 - 0.5 mg/dL 0.1 - -   HIV 10/06/2016: negative Hep B 10/06/2016: sAb positive, sAg negative, cAb negative Hep C 10/06/2016: Ab positive and RNA positive  Discharge Instructions: Discharge Instructions    Call MD for:  difficulty breathing, headache or visual disturbances    Complete by:  As directed    Call MD for:  extreme fatigue    Complete by:  As directed    Call MD for:  persistant dizziness or light-headedness    Complete by:  As directed    Call MD for:  persistant nausea and vomiting    Complete by:  As directed    Call MD for:  redness, tenderness, or signs of infection (pain, swelling, redness, odor or green/yellow discharge around incision site)    Complete by:  As directed    Call MD for:  severe uncontrolled pain    Complete by:  As directed    Call MD for:  temperature >100.4    Complete by:  As directed    Diet - low sodium heart healthy    Complete by:  As directed    Discharge instructions    Complete by:  As directed    For your pneumonia, please take the augmentin for three more doses every 12 hours starting tonight at 9pm.   You were provided with information for shelters, rehabs and suboxone clinics.   You were provided with one week script for suboxone until you get established either with a suboxone clinic or our internal medicine center clinic. You have an appointment with our clinic next week.    Please stop taking gabapentin, trazodone, xanax, or any other sedating medicine.   Increase activity slowly    Complete by:  As directed       Signed: Alphonzo Grieve, MD 10/08/2016, 3:15 PM   Pager [phone]

## 2016-10-09 ENCOUNTER — Observation Stay (HOSPITAL_COMMUNITY)
Admission: EM | Admit: 2016-10-09 | Discharge: 2016-10-11 | Disposition: A | Discharge status: Home / Self Care | Payer: Medicaid Other | Attending: Oncology | Admitting: Oncology

## 2016-10-09 ENCOUNTER — Emergency Department (HOSPITAL_COMMUNITY): Payer: Medicaid Other

## 2016-10-09 ENCOUNTER — Encounter (HOSPITAL_COMMUNITY): Payer: Self-pay | Admitting: Emergency Medicine

## 2016-10-09 ENCOUNTER — Emergency Department (HOSPITAL_BASED_OUTPATIENT_CLINIC_OR_DEPARTMENT_OTHER): Payer: Medicaid Other

## 2016-10-09 DIAGNOSIS — M79671 Pain in right foot: Secondary | ICD-10-CM | POA: Diagnosis not present

## 2016-10-09 DIAGNOSIS — G4730 Sleep apnea, unspecified: Secondary | ICD-10-CM | POA: Diagnosis not present

## 2016-10-09 DIAGNOSIS — J181 Lobar pneumonia, unspecified organism: Secondary | ICD-10-CM | POA: Diagnosis not present

## 2016-10-09 DIAGNOSIS — I10 Essential (primary) hypertension: Secondary | ICD-10-CM | POA: Diagnosis not present

## 2016-10-09 DIAGNOSIS — F1290 Cannabis use, unspecified, uncomplicated: Secondary | ICD-10-CM | POA: Insufficient documentation

## 2016-10-09 DIAGNOSIS — Z8614 Personal history of Methicillin resistant Staphylococcus aureus infection: Secondary | ICD-10-CM | POA: Insufficient documentation

## 2016-10-09 DIAGNOSIS — F17210 Nicotine dependence, cigarettes, uncomplicated: Secondary | ICD-10-CM | POA: Diagnosis not present

## 2016-10-09 DIAGNOSIS — B1920 Unspecified viral hepatitis C without hepatic coma: Secondary | ICD-10-CM | POA: Insufficient documentation

## 2016-10-09 DIAGNOSIS — F1110 Opioid abuse, uncomplicated: Secondary | ICD-10-CM | POA: Insufficient documentation

## 2016-10-09 DIAGNOSIS — Z800 Family history of malignant neoplasm of digestive organs: Secondary | ICD-10-CM

## 2016-10-09 DIAGNOSIS — R400 Somnolence: Secondary | ICD-10-CM

## 2016-10-09 DIAGNOSIS — W19XXXA Unspecified fall, initial encounter: Secondary | ICD-10-CM | POA: Diagnosis not present

## 2016-10-09 DIAGNOSIS — R0789 Other chest pain: Secondary | ICD-10-CM | POA: Diagnosis not present

## 2016-10-09 DIAGNOSIS — Z8249 Family history of ischemic heart disease and other diseases of the circulatory system: Secondary | ICD-10-CM | POA: Diagnosis not present

## 2016-10-09 DIAGNOSIS — J189 Pneumonia, unspecified organism: Secondary | ICD-10-CM | POA: Diagnosis not present

## 2016-10-09 DIAGNOSIS — D509 Iron deficiency anemia, unspecified: Secondary | ICD-10-CM | POA: Insufficient documentation

## 2016-10-09 DIAGNOSIS — M75102 Unspecified rotator cuff tear or rupture of left shoulder, not specified as traumatic: Secondary | ICD-10-CM | POA: Insufficient documentation

## 2016-10-09 DIAGNOSIS — M79672 Pain in left foot: Secondary | ICD-10-CM | POA: Diagnosis not present

## 2016-10-09 DIAGNOSIS — F1920 Other psychoactive substance dependence, uncomplicated: Secondary | ICD-10-CM | POA: Diagnosis not present

## 2016-10-09 DIAGNOSIS — Z79891 Long term (current) use of opiate analgesic: Secondary | ICD-10-CM | POA: Insufficient documentation

## 2016-10-09 DIAGNOSIS — Z823 Family history of stroke: Secondary | ICD-10-CM

## 2016-10-09 DIAGNOSIS — F1120 Opioid dependence, uncomplicated: Secondary | ICD-10-CM

## 2016-10-09 DIAGNOSIS — R778 Other specified abnormalities of plasma proteins: Secondary | ICD-10-CM

## 2016-10-09 DIAGNOSIS — R079 Chest pain, unspecified: Secondary | ICD-10-CM | POA: Diagnosis not present

## 2016-10-09 DIAGNOSIS — M25512 Pain in left shoulder: Secondary | ICD-10-CM | POA: Diagnosis not present

## 2016-10-09 DIAGNOSIS — I340 Nonrheumatic mitral (valve) insufficiency: Secondary | ICD-10-CM | POA: Diagnosis not present

## 2016-10-09 DIAGNOSIS — Z6841 Body Mass Index (BMI) 40.0 and over, adult: Secondary | ICD-10-CM | POA: Diagnosis not present

## 2016-10-09 DIAGNOSIS — D649 Anemia, unspecified: Secondary | ICD-10-CM

## 2016-10-09 DIAGNOSIS — Z590 Homelessness: Secondary | ICD-10-CM | POA: Diagnosis not present

## 2016-10-09 DIAGNOSIS — F419 Anxiety disorder, unspecified: Secondary | ICD-10-CM | POA: Insufficient documentation

## 2016-10-09 DIAGNOSIS — F1910 Other psychoactive substance abuse, uncomplicated: Secondary | ICD-10-CM | POA: Diagnosis not present

## 2016-10-09 DIAGNOSIS — M7989 Other specified soft tissue disorders: Secondary | ICD-10-CM

## 2016-10-09 DIAGNOSIS — F329 Major depressive disorder, single episode, unspecified: Secondary | ICD-10-CM | POA: Diagnosis not present

## 2016-10-09 DIAGNOSIS — R7989 Other specified abnormal findings of blood chemistry: Secondary | ICD-10-CM

## 2016-10-09 LAB — RAPID URINE DRUG SCREEN, HOSP PERFORMED
AMPHETAMINES: NOT DETECTED
Barbiturates: NOT DETECTED
Benzodiazepines: POSITIVE — AB
Cocaine: NOT DETECTED
OPIATES: POSITIVE — AB
TETRAHYDROCANNABINOL: NOT DETECTED

## 2016-10-09 LAB — I-STAT TROPONIN, ED: Troponin i, poc: 0.2 ng/mL (ref 0.00–0.08)

## 2016-10-09 LAB — CBC
HCT: 27 % — ABNORMAL LOW (ref 36.0–46.0)
Hemoglobin: 8.8 g/dL — ABNORMAL LOW (ref 12.0–15.0)
MCH: 29.7 pg (ref 26.0–34.0)
MCHC: 32.6 g/dL (ref 30.0–36.0)
MCV: 91.2 fL (ref 78.0–100.0)
Platelets: 229 10*3/uL (ref 150–400)
RBC: 2.96 MIL/uL — ABNORMAL LOW (ref 3.87–5.11)
RDW: 13.6 % (ref 11.5–15.5)
WBC: 6.5 10*3/uL (ref 4.0–10.5)

## 2016-10-09 LAB — DIFFERENTIAL
BASOS ABS: 0 10*3/uL (ref 0.0–0.1)
Basophils Relative: 1 %
EOS ABS: 0.2 10*3/uL (ref 0.0–0.7)
Eosinophils Relative: 3 %
LYMPHS ABS: 2 10*3/uL (ref 0.7–4.0)
Lymphocytes Relative: 38 %
MONOS PCT: 10 %
Monocytes Absolute: 0.5 10*3/uL (ref 0.1–1.0)
NEUTROS ABS: 2.5 10*3/uL (ref 1.7–7.7)
Neutrophils Relative %: 48 %

## 2016-10-09 LAB — ECHOCARDIOGRAM COMPLETE
Height: 63 in
Weight: 3932.8 oz

## 2016-10-09 LAB — BRAIN NATRIURETIC PEPTIDE: B Natriuretic Peptide: 474.1 pg/mL — ABNORMAL HIGH (ref 0.0–100.0)

## 2016-10-09 LAB — BASIC METABOLIC PANEL WITH GFR
Anion gap: 10 (ref 5–15)
BUN: 13 mg/dL (ref 6–20)
CO2: 22 mmol/L (ref 22–32)
Calcium: 8.6 mg/dL — ABNORMAL LOW (ref 8.9–10.3)
Chloride: 104 mmol/L (ref 101–111)
Creatinine, Ser: 1.07 mg/dL — ABNORMAL HIGH (ref 0.44–1.00)
GFR calc Af Amer: >60 mL/min
GFR calc non Af Amer: >60 mL/min
Glucose, Bld: 82 mg/dL (ref 65–99)
Potassium: 4.3 mmol/L (ref 3.5–5.1)
Sodium: 136 mmol/L (ref 135–145)

## 2016-10-09 LAB — HEPATITIS C VRS RNA DETECT BY PCR-QUAL: HEPATITIS C VRS RNA BY PCR-QUAL: POSITIVE — AB

## 2016-10-09 LAB — TROPONIN I
Troponin I: 0.18 ng/mL
Troponin I: 0.19 ng/mL (ref <0.03)
Troponin I: 0.16 ng/mL (ref <0.03)

## 2016-10-09 MED ORDER — BUPRENORPHINE HCL 8 MG SL SUBL
8.0000 mg | SUBLINGUAL_TABLET | Freq: Two times a day (BID) | SUBLINGUAL | Status: DC
  Administered 2016-10-09 – 2016-10-11 (×5): 8 mg via SUBLINGUAL
  Filled 2016-10-09 (×5): qty 1

## 2016-10-09 MED ORDER — MUPIROCIN 2 % EX OINT
1.0000 "application " | TOPICAL_OINTMENT | Freq: Two times a day (BID) | CUTANEOUS | Status: DC
  Administered 2016-10-09 – 2016-10-11 (×4): 1 via NASAL
  Filled 2016-10-09: qty 22

## 2016-10-09 MED ORDER — MORPHINE SULFATE (PF) 4 MG/ML IV SOLN
4.0000 mg | Freq: Once | INTRAVENOUS | Status: AC
  Administered 2016-10-09: 4 mg via INTRAVENOUS
  Filled 2016-10-09: qty 1

## 2016-10-09 MED ORDER — VENLAFAXINE HCL 75 MG PO TABS
150.0000 mg | ORAL_TABLET | Freq: Two times a day (BID) | ORAL | Status: DC
  Administered 2016-10-09: 150 mg via ORAL
  Filled 2016-10-09 (×2): qty 2

## 2016-10-09 MED ORDER — KETOROLAC TROMETHAMINE 30 MG/ML IJ SOLN
30.0000 mg | Freq: Four times a day (QID) | INTRAMUSCULAR | Status: DC | PRN
  Administered 2016-10-09 – 2016-10-10 (×3): 30 mg via INTRAVENOUS
  Filled 2016-10-09 (×3): qty 1

## 2016-10-09 MED ORDER — VENLAFAXINE HCL 75 MG PO TABS: 75.0000 mg | ORAL_TABLET | Freq: Two times a day (BID) | ORAL | Status: DC

## 2016-10-09 MED ORDER — FUROSEMIDE 10 MG/ML IJ SOLN
40.0000 mg | Freq: Once | INTRAMUSCULAR | Status: AC
  Administered 2016-10-09: 40 mg via INTRAVENOUS
  Filled 2016-10-09: qty 4

## 2016-10-09 MED ORDER — HEPARIN (PORCINE) IN NACL 100-0.45 UNIT/ML-% IJ SOLN
1000.0000 [IU]/h | INTRAMUSCULAR | Status: DC
  Filled 2016-10-09 (×2): qty 250

## 2016-10-09 MED ORDER — SODIUM CHLORIDE 0.9 % IV BOLUS (SEPSIS): 1000.0000 mL | Freq: Once | INTRAVENOUS | Status: DC

## 2016-10-09 MED ORDER — CHLORHEXIDINE GLUCONATE CLOTH 2 % EX PADS
6.0000 | MEDICATED_PAD | Freq: Every day | CUTANEOUS | Status: DC
Start: 2016-10-10 — End: 2016-10-11
  Administered 2016-10-10 – 2016-10-11 (×2): 6 via TOPICAL

## 2016-10-09 MED ORDER — HEPARIN BOLUS VIA INFUSION
4000.0000 [IU] | Freq: Once | INTRAVENOUS | Status: DC
  Filled 2016-10-09: qty 4000

## 2016-10-09 MED ORDER — ENOXAPARIN SODIUM 40 MG/0.4ML ~~LOC~~ SOLN
40.0000 mg | SUBCUTANEOUS | Status: DC
  Administered 2016-10-09 – 2016-10-10 (×2): 40 mg via SUBCUTANEOUS
  Filled 2016-10-09 (×2): qty 0.4

## 2016-10-09 MED ORDER — AMOXICILLIN-POT CLAVULANATE 875-125 MG PO TABS: 1.0000 | ORAL_TABLET | Freq: Two times a day (BID) | ORAL | Status: DC

## 2016-10-09 MED ORDER — VENLAFAXINE HCL 75 MG PO TABS
75.0000 mg | ORAL_TABLET | Freq: Two times a day (BID) | ORAL | Status: DC
  Administered 2016-10-09 – 2016-10-11 (×4): 75 mg via ORAL
  Filled 2016-10-09 (×6): qty 1

## 2016-10-09 MED ORDER — AMOXICILLIN-POT CLAVULANATE 875-125 MG PO TABS
1.0000 | ORAL_TABLET | Freq: Two times a day (BID) | ORAL | Status: AC
  Administered 2016-10-09 – 2016-10-10 (×3): 1 via ORAL
  Filled 2016-10-09 (×3): qty 1

## 2016-10-09 MED ORDER — ASPIRIN 81 MG PO CHEW
324.0000 mg | CHEWABLE_TABLET | Freq: Once | ORAL | Status: AC
  Administered 2016-10-09: 324 mg via ORAL
  Filled 2016-10-09: qty 4

## 2016-10-09 MED ORDER — POLYETHYLENE GLYCOL 3350 17 G PO PACK
17.0000 g | PACK | Freq: Once | ORAL | Status: AC
  Administered 2016-10-09: 17 g via ORAL
  Filled 2016-10-09: qty 1

## 2016-10-09 NOTE — H&P (Signed)
Date: 10/09/2016               Patient Name:  Judy Robles MRN: [id number]  DOB: 07-Mar-1976 Age / Sex: 41 y.o., female   PCP: Jearld Lesch, MD         Medical Service: Internal Medicine Teaching Service         Attending Physician: Dr. Doneen Poisson, MD    First Contact: Dr. Nyra Market Pager: [phone]  Second Contact: Dr. Valentino Nose  Pager: [phone]       After Hours (After 5p/  First Contact Pager: [phone]  weekends / holidays): Second Contact Pager: [phone]   Chief Complaint: Chest Pain  History of Present Illness: Ms. Judy Robles is a 41 year old female with history of Depression, H/o chronic opioid use disorder, and hypertension who presents to the ED with atypical chest pain. Patient had been discharged on 10/08/2016 from [facility] and while waiting for her ride stated she experienced sudden onset left-sided chest pain.  She reports she was walking when she experienced the chest pain. She describes the pain as a constant aching pain that has been ongoing for the past 6 hours. She reports that it has progressively gotten worse over the past 6 hours and nothing seems to make the pain better. She has associated symptoms of dizziness, shortness of breath, cough and left-sided arm pain.  She stated " I felt that I needed to be rechecked back into the hospital". She reports the cough is non productive and started after being in the ICU during previous admission.  She states she has chest pain when she coughs.  She denies pleuritic chest pain, diaphoresis or fever/chills.  She reports having damage to her left rotator cuff 3 months ago and reports currently that her left arm hurts along with her shoulder when she lifts her arm. She also reports a history of right foot surgery in July 2017.       Meds:  No outpatient prescriptions have been marked as taking for the 10/09/16 encounter [facility] Encounter).     Allergies: Allergies as of 10/09/2016 - Review Complete  10/09/2016  Allergen Reaction Noted  . Tylenol [acetaminophen] Itching and Other (See Comments) 07/03/2015  . Lisinopril Other (See Comments) 07/22/2016   Past Medical History:  Diagnosis Date  . Arthritis   . Hypertension   . MRSA (methicillin resistant Staphylococcus aureus)   . Obesity   . Sleep apnea     Family History:  Family History  Problem Relation Age of Onset  . Throat cancer Mother   . Hypertension Father   . Stroke Father   Grandmother:  Congestive Heart Failure Grandfather:  Congestive Heart Failure   Social History: Tobacco:  Current everyday smoker.  30 pack year smoking history Alcohol use: Denies  Illicit drug use: occasional marijuana and cocaine use.  "issues with pain pills in the past"  Review of Systems: A complete ROS was negative except as per HPI.   Physical Exam: Blood pressure 110/61, pulse 70, temperature 98.5 F (36.9 C), temperature source Oral, resp. rate 18, SpO2 100 %. Physical Exam  Constitutional: She is oriented to person, place, and time and well-developed, well-nourished, and in no distress.  Eyes: Pupils are equal, round, and reactive to light.  Neck: Normal range of motion. Neck supple.  JVD 1-2 cm above sternum   Cardiovascular: Normal rate, regular rhythm and normal heart sounds.  Exam reveals no gallop and no friction rub.  No murmur heard. Pulmonary/Chest: Effort normal and breath sounds normal. She has no wheezes. She has no rales.  Abdominal: Soft. Bowel sounds are normal. She exhibits no distension and no mass. There is no tenderness. There is no rebound and no guarding.  Musculoskeletal:  3+ pitting edema on the right lower extremity No pitting edema on left lower extremity Bilateral calf tenderness Left arm and shoulder pain on external rotation of the left arm     Neurological: She is alert and oriented to person, place, and time.  Skin: Skin is warm and dry.    CBC    Component Value Date/Time   WBC 6.5  10/09/2016 0226   RBC 2.96 (L) 10/09/2016 0226   HGB 8.8 (L) 10/09/2016 0226   HCT 27.0 (L) 10/09/2016 0226   PLT 229 10/09/2016 0226   MCV 91.2 10/09/2016 0226   MCH 29.7 10/09/2016 0226   MCHC 32.6 10/09/2016 0226   RDW 13.6 10/09/2016 0226   LYMPHSABS 2.0 10/09/2016 0302   MONOABS 0.5 10/09/2016 0302   EOSABS 0.2 10/09/2016 0302   BASOSABS 0.0 10/09/2016 0302   BMET    Component Value Date/Time   NA 136 10/09/2016 0226   K 4.3 10/09/2016 0226   CL 104 10/09/2016 0226   CO2 22 10/09/2016 0226   GLUCOSE 82 10/09/2016 0226   BUN 13 10/09/2016 0226   CREATININE 1.07 (H) 10/09/2016 0226   CREATININE 0.79 05/01/2014 1502   CALCIUM 8.6 (L) 10/09/2016 0226   GFRNONAA >60 10/09/2016 0226   GFRNONAA >89 05/01/2014 1502   GFRAA >60 10/09/2016 0226   GFRAA >89 05/01/2014 1502     EKG: Normal sinus rhythm, no ST elevation or T wave inversion, unchanged from previous   CXR: Persistent multifocal opacities involving the left mid and lower lung, concerning for pneumonia. Overall, opacities are slightly more dense as compared to most recent radiograph from 10/05/2016.  Assessment & Plan by Problem: Principal Problem: Atypical Chest pain Patient presents with sudden onset of aching left sided chest pain with pain radiating to the left arm that has been ongoing for the past 6 hrs.  EKG is not concerning for ischemia or infarction.  Patient has an elevated troponin of 0.18, previous poc troponin was 0.2. Risk factors for CAD include smoking and hypertension.  At this point ACS is less likely.  Heart score of 2.  Will trend troponins.  Patient complains of bilateral lower extremity edema and calf tenderness on exam.  On previous admission it was noted that bilateral lower extremity was present.  Patient is satting 100% on room air and not tachycardic.  Wells score is low risk probability for PE.   Patient does present with mild JVD and shortness of breath with ambulation.  Symptoms currently  concerning for congestive heart failure although no LVH seen on EKG or cardiomegaly on chest x-ray.  BNP is elevated.  Will obtain Echo.  Also patient noted I/O to be negative 1.2L on 2/17.  Will give a dose of lasix.    Patient was recently admitted on 10/03/2016 for encephalopathy due to acute hypercarbic respiratory failure with chest x-ray consistent with left sided pneumonia.  She was admitted to the ICU as she required intubation.  She was thought to have overdosed on benzodiazepines.  UDS was positive for benzos and opiates on 2/12.  She was treated with proper antibiotics and on day of discharge she was oxygenating well on room air.  I suspect her current chest pain is  residual from her pneumonia in combination with musculoskeletal pain.  She reports chest pain is reproducible on palpation and also has chest pain when she coughs.  - Trend troponin - Echo - Lasix    - UDS  Community acquired pneumonia of left lung Patient was on treatment day 6 of 7 with antibiotics for pneumonia.  Will give last dose today of augmentin. -augmentin  Microcytic anemia Currently hemoglobin 8.8.  Stable with slow trend downwards.  No active signs of bleeding.  Will continue to monitor - CBC  Polysubstance dependence including opioid type drug with complication, episodic abuse (HCC) She was recently admitted for benzodiazapine overdose.  Prior to admission on 2/12 patient was on suboxone therapy prescribed by Dr. Tollie Eth through patient's rehab facility.  Will continue buprenorphine. - Buprenorphine     Essential hypertension, benign History of hypertension and reports she was HCTZ and amlodipine.  Currently normotensive - Holding blood pressure medication  Anxiety/MDD:  -venlafaxine 150mg  daily   Dispo: Admit patient to Observation with expected length of stay less than 2 midnights.  Signed: , DO 10/09/2016, 5:25 AM  Pager: [phone]

## 2016-10-09 NOTE — Progress Notes (Signed)
  Echocardiogram 2D Echocardiogram has been performed.  Judy Robles 10/09/2016, 1:25 PM

## 2016-10-09 NOTE — ED Notes (Signed)
Admitting MD at bedside.

## 2016-10-09 NOTE — Progress Notes (Signed)
Pt alert and oriented, watching TV, shift assessment done. Pt refused bed alarm on, educated on patient's safety plan. Will continue to do hourly rounding.

## 2016-10-09 NOTE — Progress Notes (Signed)
Patient  Arrived on the floor around 10:00, alert and oriented, c/o leg pain, v/s stable. Awaiting MD to order pain med,SR on the monitor.will continue to monitor the patient

## 2016-10-09 NOTE — Progress Notes (Signed)
ANTICOAGULATION CONSULT NOTE - Initial Consult  Pharmacy Consult for Heparin  Indication: chest pain/ACS  Allergies  Allergen Reactions  . Tylenol [Acetaminophen] Itching and Other (See Comments)    Welts  . Lisinopril Other (See Comments)    Shuts down kidneys    Vital Signs: Temp: 98.5 F (36.9 C) (02/18 0223) Temp Source: Oral (02/18 0223) BP: 110/61 (02/18 0223) Pulse Rate: 70 (02/18 0223)  Labs:  Recent Labs  10/09/16 0226  HGB 8.8*  HCT 27.0*  PLT 229  CREATININE 1.07*    Estimated Creatinine Clearance: 84.8 mL/min (by C-G formula based on SCr of 1.07 mg/dL (H)).   Medical History: Past Medical History:  Diagnosis Date  . Arthritis   . Hypertension   . MRSA (methicillin resistant Staphylococcus aureus)   . Obesity   . Sleep apnea     Assessment: 41 y/o F who was discharged on 2/17 after treatment for PNA now back with chest pain. Starting heparin for mildly elevated troponin. Pt is anemic with Hgb 8.8, no bleeding noted, renal function ok. PTA meds reviewed.   Goal of Therapy:  Heparin level 0.3-0.7 units/ml Monitor platelets by anticoagulation protocol: Yes   Plan:  -Heparin 4000 units BOLUS -Start heparin drip at 1000 units/hr -1200 HL -Daily CBC/HL -Monitor for bleeding  Judy Robles 10/09/2016,3:10 AM

## 2016-10-09 NOTE — ED Triage Notes (Signed)
Patient presents to ED after being d/c'd yesterday.  Patient was sitting in the lobby after her discharge, and states she developed a left sided chest ache, and "my legs feel hard a rock".  No pitting edema noted.  Pt c/o SOB and dizziness, denies any other associated symptoms.

## 2016-10-09 NOTE — Progress Notes (Signed)
Subjective: Judy Robles reports that following discharge last night she was waiting in the ED waiting room for her ride to pick her up; states her friend was coming to get her and she was to stay with her for the next few days. While in the ED she noted left chest pain and left arm pain. She reports the left sided chest pain was aching in nature and worse with deep inspiration and cough. The left arm pain felt different and was sharp in nature with pain to her left wrist. Notes history of left shoulder rotator cuff tear. She noted worsened shortness of breath, nausea, dizziness. She was also very concerned over swelling in her bilateral legs that she reports is new. This morning, she reports no further chest pain but reports improved but persistent left arm pain, still sharp in nature. Main complaints are leg edema and pain in her bilateral feet. Complains of the nursing staff 'diluting' her pain medicine earlier and it not helping with the pain.   Objective: Vital signs in last 24 hours: Vitals:   10/09/16 0815 10/09/16 0845 10/09/16 0939 10/09/16 1012  BP:   114/60 96/72  Pulse: 63  66 73  Resp: 23 14  20   Temp:    97.8 F (36.6 C)  TempSrc:    Oral  SpO2: 98%  97% 94%  Weight:    245 lb 12.8 oz (111.5 kg)  Height:    5\' 3"  (1.6 m)   Weight change:   Intake/Output Summary (Last 24 hours) at 10/09/16 1038 Last data filed at 10/09/16 1014  Gross per 24 hour  Intake                0 ml  Output             2000 ml  Net            -2000 ml    Physical Exam  Constitutional: She is well-developed, well-nourished, and in no distress. No distress.  Neck: No JVD present.  Cardiovascular: Normal rate, regular rhythm and normal heart sounds.   Pulmonary/Chest: Effort normal. No respiratory distress. She has no wheezes. She has no rales.  Mildly decreased breath sounds over left lower lung fields. No crackles present.   Musculoskeletal:  1+ pitting edema in bilateral lower extremities.  Bilateral calves equal size and diameter. Painful to palpation over anterior and posterior surfaces. Intact 2+ distal pulses.  Left arm with intact sensation. Patient unable to abduct shoulder past 90 degrees secondary to pain. Full range of motion and no pain with passive movement. 5/5 strength present in left upper extremity.    Medications: I have reviewed the patient's current medications. Scheduled Meds: . amoxicillin-clavulanate  1 tablet Oral Q12H  . buprenorphine  8 mg Sublingual Q12H  . venlafaxine  150 mg Oral BID WC   Continuous Infusions: PRN Meds:. Assessment/Plan:  Atypical Chest pain: Chest pain has resolved. Persistent left arm pain with history of rotator cuff injury in remote past. EKG unremarkable with no ischemic changes suggestive of ACS. Troponin mildly elevated at 0.18 at admission. Does not appear volume overload on exam with no JVD, crackles but does have 1+ pitting edema; though did receive Lasix IV 40 mg x1 dose prior to my exam. No pleural fluid on CXR; only notable for persistent LLL PNA currently on treatment. No history of CHF and does not report a history of CHF symptoms and symptoms began acutely last night. Low clinical suspicion for  PE as patient was on [facility] while in the hospital and had no hypoxia or tachycardia. Moreover, calf pain is bilateral and calf size is equal bilaterally.  I suspect her chest pain is multifactorial from her resolving PNA and her left shoulder rotator cuff injury. Will trend troponins and repeat EKG later today. UDS positive for benzos and opiates (benzos given at last hospitalization and on buprenorphine).  - Trend troponin - Repeat EKG this afternoon - Echo - Toradol prn; if effective can trial Voltaren gel  Community acquired pneumonia of left lung Finish day 7 of treatment today with Augmentin bid, last dose this pm  Microcytic anemia Currently hemoglobin 8.8.  Stable with slow trend downwards.  No active signs of bleeding.   Will continue to monitor - CBC  Polysubstance dependence including opioid type drug with complication, episodic abuse (HCC) She was recently admitted for benzodiazapine overdose.  Prior to admission on 2/12 patient was on suboxone therapy prescribed by Dr. Tollie Eth through patient's rehab facility.  Will continue buprenorphine. - Buprenorphine    - Social work consult  Essential hypertension, benign History of hypertension and reports she was HCTZ and amlodipine.  Currently normotensive to soft.  - Holding blood pressure medication  Anxiety/MDD: -venlafaxine 150mg  daily  Homelessness: Patient has varying reports of who she will stay with and where she will go at discharge. She is currently homeless. She has reported that she will stay with family, friends and has no where to go with different conversations with various providers. Will consult social work for these issues.   Dispo: Possible discharge later today pending cardiac evaluation   The patient does have a current PCP Karren Burly, MD) and does need an [facility] hospital follow-up appointment after discharge.  The patient does have transportation limitations that hinder transportation to clinic appointments.  .Services Needed at time of discharge: Y = Yes, Blank = No PT:   OT:   RN:   Equipment:   Other:     LOS: 0 days   [facility], MD IMTS PGY-2 [phone] 10/09/2016, 10:38 AM

## 2016-10-09 NOTE — Progress Notes (Signed)
MD called said he concern about the pt falling asleep while talking to the patient. Assess the pt., patient is sleepy but easily arouse , pt. Stated subutex and effexor make her sleepy. Check the patient belonging no medicine find. Pt request to decrease her effexor dose, will notiifed MD and continue to monitor patient.

## 2016-10-09 NOTE — ED Notes (Signed)
Patient transported to X-ray 

## 2016-10-09 NOTE — ED Provider Notes (Signed)
[facility]-EMERGENCY DEPT Provider Note   CSN: [id number] Arrival date & time: 10/09/16  0216    By signing my name below, I, Valentino Saxon, attest that this documentation has been prepared under the direction and in the presence of Dione Booze, MD. Electronically Signed: Valentino Saxon, ED Scribe. 10/09/16. 3:09 AM.  History   Chief Complaint Chief Complaint  Patient presents with  . Chest Pain  . Leg Swelling   The history is provided by the patient. No language interpreter was used.   HPI Comments: Judy Robles is a 41 y.o. female with PMHx of HTN and h/o substance abuse who presents to the Emergency Department complaining of 8/10, constant, aching generalized chest pain onset 11 pm yesterday evening. Pt was last seen in the ED on 02/12 and was diagnosed with community acquired pneumonia of left lung. Pt reports associated mild SOB, dizziness and bilateral leg swelling accompanied by bilateral leg pain. She notes walking outside of the ED and having minimal difficulty due to increasing leg pain. Pt describes feeling like her legs were "hard as a rock". No alleviating factors noted. Pt denies nausea and sweats. Pt states she is former smoker. She also denies recent illicit drug use. She notes having a Fhx of heart problems on her paternal side.   Past Medical History:  Diagnosis Date  . Arthritis   . Hypertension   . MRSA (methicillin resistant Staphylococcus aureus)   . Obesity   . Sleep apnea     Patient Active Problem List   Diagnosis Date Noted  . Pressure injury of skin 10/05/2016  . Community acquired pneumonia of left lung 10/03/2016  . Chronic renal insufficiency 07/22/2016  . Polysubstance dependence including opioid type drug with complication, episodic abuse (HCC) 07/21/2016  . MDD (major depressive disorder), recurrent severe, without psychosis (HCC) 07/21/2016  . Chronic anemia 07/03/2015  . Chronic pain 07/03/2015  . Diarrhea 05/01/2014  . Essential  hypertension, benign 04/17/2014    Past Surgical History:  Procedure Laterality Date  . I&D EXTREMITY Right 04/16/2014   Procedure: IRRIGATION AND DEBRIDEMENT EXTREMITY;  Surgeon: Sharma Covert, MD;  Location: [facility] OR;  Service: Orthopedics;  Laterality: Right;    OB History    No data available       Home Medications    Prior to Admission medications   Medication Sig Start Date End Date Taking? Authorizing Provider  amoxicillin-clavulanate (AUGMENTIN) 875-125 MG tablet Take 1 tablet by mouth every 12 (twelve) hours. 10/08/16 10/10/16  Nyra Market, MD  Buprenorphine HCl-Naloxone HCl (SUBOXONE) 8-2 MG FILM Place 1 Film under the tongue 2 (two) times daily. 10/07/16   Tyson Alias, MD  dextromethorphan-guaiFENesin [facility] DM) 30-600 MG 12hr tablet Take 1 tablet by mouth 2 (two) times daily. 10/08/16   Nyra Market, MD  nicotine (NICODERM CQ - DOSED IN MG/24 HR) 7 mg/24hr patch Place 1 patch (7 mg total) onto the skin daily. Patient not taking: Reported on 10/01/2016 07/27/16   Adonis Brook, NP  PROAIR HFA 108 [phone] Base) MCG/ACT inhaler Inhale 2 puffs into the lungs every 6 (six) hours as needed for wheezing or shortness of breath.  09/30/16   Historical Provider, MD  senna-docusate (SENOKOT-S) 8.6-50 MG tablet Take 2 tablets by mouth 2 (two) times daily. 10/08/16   Nyra Market, MD  venlafaxine (EFFEXOR) 75 MG tablet Take 150 mg by mouth 2 (two) times daily.    Historical Provider, MD    Family History Family History  Problem Relation Age  of Onset  . Throat cancer Mother   . Hypertension Father   . Stroke Father     Social History Social History  Substance Use Topics  . Smoking status: Current Every Day Smoker    Packs/day: 0.25    Types: Cigarettes  . Smokeless tobacco: Never Used     Comment: 1/2PPD  . Alcohol use No     Allergies   Tylenol [acetaminophen] and Lisinopril   Review of Systems Review of Systems  Respiratory: Positive for shortness of breath.    Cardiovascular: Positive for chest pain and leg swelling.  Gastrointestinal: Negative for nausea.  Musculoskeletal: Positive for myalgias (bilateral leg pain).  Neurological: Positive for dizziness.  All other systems reviewed and are negative.    Physical Exam Updated Vital Signs BP 110/61 (BP Location: Left Arm)   Pulse 70   Temp 98.5 F (36.9 C) (Oral)   Resp 18   SpO2 100%   Physical Exam  Constitutional: She is oriented to person, place, and time. She appears well-developed and well-nourished.  HENT:  Head: Normocephalic and atraumatic.  Eyes: EOM are normal. Pupils are equal, round, and reactive to light.  Neck: Normal range of motion. Neck supple. No JVD present.  Cardiovascular: Normal rate, regular rhythm and normal heart sounds.   No murmur heard. Pulmonary/Chest: Effort normal and breath sounds normal. She has no wheezes. She has no rales. She exhibits no tenderness.  Abdominal: Soft. Bowel sounds are normal. She exhibits no distension and no mass. There is no tenderness.  Musculoskeletal: Normal range of motion. She exhibits edema.  3+ pretibial and pedal edema.   Lymphadenopathy:    She has no cervical adenopathy.  Neurological: She is alert and oriented to person, place, and time. No cranial nerve deficit. She exhibits normal muscle tone. Coordination normal.  Skin: Skin is warm and dry. No rash noted.  Psychiatric: She has a normal mood and affect. Her behavior is normal. Judgment and thought content normal.  Nursing note and vitals reviewed.    ED Treatments / Results   DIAGNOSTIC STUDIES: Oxygen Saturation is 100% on RA, normal by my interpretation.    COORDINATION OF CARE: 3:05 AM Discussed treatment plan with pt at bedside which includes labs, chest imaging, EKG and pain management and pt agreed to plan.   Labs (all labs ordered are listed, but only abnormal results are displayed) Labs Reviewed  BASIC METABOLIC PANEL - Abnormal; Notable for the  following:       Result Value   Creatinine, Ser 1.07 (*)    Calcium 8.6 (*)    All other components within normal limits  CBC - Abnormal; Notable for the following:    RBC 2.96 (*)    Hemoglobin 8.8 (*)    HCT 27.0 (*)    All other components within normal limits  I-STAT TROPOININ, ED - Abnormal; Notable for the following:    Troponin i, poc 0.20 (*)    All other components within normal limits  DIFFERENTIAL  BRAIN NATRIURETIC PEPTIDE  HEPARIN LEVEL (UNFRACTIONATED)  TROPONIN I    EKG  EKG Interpretation  Date/Time:  Sunday October 09 2016 02:30:09 EST Ventricular Rate:  65 PR Interval:  136 QRS Duration: 90 QT Interval:  428 QTC Calculation: 445 R Axis:   53 Text Interpretation:  Normal sinus rhythm Cannot rule out Anterior infarct , age undetermined Abnormal ECG When compared with ECG of 09/04/2016, HEART RATE has decreased Confirmed by Preston Fleeting  MD, Fonnie Crookshanks ([id number]) on  10/09/2016 2:28:43 AM       Radiology Dg Chest 2 View  Result Date: 10/09/2016 CLINICAL DATA:  Initial evaluation for acute chest pain, or recent pneumonia. EXAM: CHEST  2 VIEW COMPARISON:  Prior radiograph from 10/05/2016. FINDINGS: Cardiac and mediastinal silhouettes are stable in size and contour, and remain within normal limits. Lungs normally inflated. Mild elevation left hemidiaphragm, stable. Persistent scattered infiltrates predominantly involving the lingula and left lower lobe, compatible with multifocal pneumonia. Overall, opacities are slightly more dense as compared to previous. No new consolidative opacity within the right lung. No pulmonary edema or pleural effusion. No pneumothorax. Osseous structures unchanged. IMPRESSION: Persistent multifocal opacities involving the left mid and lower lung, concerning for pneumonia. Overall, opacities are slightly more dense as compared to most recent radiograph from 10/05/2016. Electronically Signed   By: Rise Mu M.D.   On: 10/09/2016 03:08     Procedures Procedures (including critical care time) CRITICAL CARE Performed by: ZMOQH,UTMLY Total critical care time: 40 minutes Critical care time was exclusive of separately billable procedures and treating other patients. Critical care was necessary to treat or prevent imminent or life-threatening deterioration. Critical care was time spent personally by me on the following activities: development of treatment plan with patient and/or surrogate as well as nursing, discussions with consultants, evaluation of patient's response to treatment, examination of patient, obtaining history from patient or surrogate, ordering and performing treatments and interventions, ordering and review of laboratory studies, ordering and review of radiographic studies, pulse oximetry and re-evaluation of patient's condition.  Medications Ordered in ED Medications  aspirin chewable tablet 324 mg (not administered)  morphine 4 MG/ML injection 4 mg (not administered)     Initial Impression / Assessment and Plan / ED Course  I have reviewed the triage vital signs and the nursing notes.  Pertinent labs & imaging results that were available during my care of the patient were reviewed by me and considered in my medical decision making (see chart for details).  Chest pain in patient just discharged from hospital buying episode of pneumonia. ECG shows no acute changes, but troponin is elevated. No troponins obtained while in the hospital. She was in ICU for several days. It is very possible that elevated troponin is related to demand ischemia while admitted to ICU. However, she will need serial troponins to assess that. In the meantime, she is started on heparin for possible ACS, and is given morphine for pain. Also, hemoglobin is low although not significantly lower than recent value. There has been a drop since hospital admission 5 days ago. Case is discussed with Dr. Allena Katz of internal medicine teaching service who  agrees to admit the patient.  Final Clinical Impressions(s) / ED Diagnoses   Final diagnoses:  Chest pain, unspecified type  Elevated troponin I level  Normochromic normocytic anemia    New Prescriptions New Prescriptions   No medications on file    I personally performed the services described in this documentation, which was scribed in my presence. The recorded information has been reviewed and is accurate.       Dione Booze, MD 10/09/16 [phone]

## 2016-10-09 NOTE — ED Notes (Signed)
Attempted IV x2, once in L forearm, once in R antecub.  Both unsuccessful.

## 2016-10-10 ENCOUNTER — Observation Stay (HOSPITAL_BASED_OUTPATIENT_CLINIC_OR_DEPARTMENT_OTHER): Payer: Medicaid Other

## 2016-10-10 DIAGNOSIS — R0789 Other chest pain: Secondary | ICD-10-CM | POA: Diagnosis not present

## 2016-10-10 DIAGNOSIS — R079 Chest pain, unspecified: Secondary | ICD-10-CM

## 2016-10-10 DIAGNOSIS — R748 Abnormal levels of other serum enzymes: Secondary | ICD-10-CM | POA: Diagnosis not present

## 2016-10-10 LAB — NM MYOCAR MULTI W/SPECT W/WALL MOTION / EF
CSEPPHR: 82 {beats}/min
Estimated workload: 1 METS
Exercise duration (min): 5 min
MPHR: 178 {beats}/min
Percent HR: 46 %
Rest HR: 66 {beats}/min

## 2016-10-10 MED ORDER — REGADENOSON 0.4 MG/5ML IV SOLN
INTRAVENOUS | Status: AC
  Filled 2016-10-10: qty 5

## 2016-10-10 MED ORDER — TECHNETIUM TC 99M TETROFOSMIN IV KIT
10.0000 | PACK | Freq: Once | INTRAVENOUS | Status: AC | PRN
  Administered 2016-10-10: 10 via INTRAVENOUS

## 2016-10-10 MED ORDER — REGADENOSON 0.4 MG/5ML IV SOLN
0.4000 mg | Freq: Once | INTRAVENOUS | Status: AC
  Administered 2016-10-10: 0.4 mg via INTRAVENOUS
  Filled 2016-10-10: qty 5

## 2016-10-10 NOTE — Progress Notes (Signed)
Subjective: Denies any further chest pain this morning. No shortness of breath, palpitations, nausea or vomiting. Still very concerned about her foot pain. Reports the nursing staff is holding it due to her low blood pressures; however she is only receiving toradol prn and would be no reason to hold this for low blood pressures.   Objective: Vital signs in last 24 hours: Vitals:   10/09/16 1200 10/09/16 1958 10/10/16 0030 10/10/16 0439  BP: 110/62 (!) 98/49 124/64 (!) 99/51  Pulse: (!) 57 64 70   Resp: 18 18 18 18   Temp: 97.4 F (36.3 C) 98 F (36.7 C) 98 F (36.7 C) 98 F (36.7 C)  TempSrc: Oral Oral Oral Oral  SpO2: 100% 99% 94% 96%  Weight:    243 lb 11.2 oz (110.5 kg)  Height:       Weight change:   Intake/Output Summary (Last 24 hours) at 10/10/16 10/12/16 Last data filed at 10/10/16 10/12/16  Gross per 24 hour  Intake              582 ml  Output             2800 ml  Net            -2218 ml    Physical Exam  Constitutional: She is well-developed, well-nourished, and in no distress. No distress.  Cardiovascular: Normal rate, regular rhythm and normal heart sounds.   Pulmonary/Chest: Effort normal and breath sounds normal.  Musculoskeletal:  Trace pedal edema   Medications: I have reviewed the patient's current medications. Scheduled Meds: . amoxicillin-clavulanate  1 tablet Oral Q12H  . buprenorphine  8 mg Sublingual Q12H  . Chlorhexidine Gluconate Cloth  6 each Topical Q0600  . enoxaparin (LOVENOX) injection  40 mg Subcutaneous Q24H  . mupirocin ointment  1 application Nasal BID  . venlafaxine  75 mg Oral BID WC   Continuous Infusions: PRN Meds:.ketorolac Assessment/Plan:  Atypical Chest pain: No further chest pain. Does not appear volume overloaded on exam. 2D ECHO yesterday shows EF 60-65% with mild LVH, no wall motion abnormalities. Mild mitral regurgitation and PA peak pressure 46 mm Hg. Otherwise normal study. Troponins were 0..18>0.19>0.16 yesterday. EKG did  not show any acute ischemic changes. Mild troponin elevation may be from subclinical infarction during acute decompensation requiring intubation and ICU admission last week. Chest pain more consistent with possible left shoulder tendonitis but will have cards evaluation given troponin elevation. She does have risk factors including obesity, HTN and smoking. Held NPO over night. Consulted cardiology today for an evaluation and will get get stress test today.  -F/U stress test -Possible D/C this afternoon pending above  Right foot pain with dorsal swelling: Edema improved. Will try ACE bandage as patient reports this helps. Continue Toradol prn.   Community acquired pneumonia of left lung Finished course of azithromycin. Will need follow up outpatient for resolution.   Polysubstance dependence including opioid type drug with complication, episodic abuse (HCC) She was recently admitted for benzodiazapine overdose. Prior to admission on 2/12 patient was on suboxone therapy prescribed by Dr. 4/12 through patient's rehab facility. Will continue buprenorphine. - Buprenorphine  - Social work consult  Essential hypertension, benign History of hypertension and reports she was HCTZ and amlodipine. Currently normotensive to soft.  - Holding blood pressure medication  Anxiety/MDD: Patient reports somnolence with taking the venlafaxine. Will reduce dose.  -venlafaxine 75 mg daily  Homelessness: Patient has varying reports of who she will stay with and where  she will go at discharge. She is currently homeless. She has reported that she will stay with family, friends and has no where to go with different conversations with various providers. Will consult social work for these issues.   Dispo: Possible D/C home today pending stress test   The patient does have a current PCP Karren Burly Artelia Laroche, MD) and does need an [facility] hospital follow-up appointment after discharge.  The patient does have  transportation limitations that hinder transportation to clinic appointments.  .Services Needed at time of discharge: Y = Yes, Blank = No PT:   OT:   RN:   Equipment:   Other:     LOS: 0 days   Valentino Nose, MD IMTS PGY-2 [phone] 10/10/2016, 9:23 AM

## 2016-10-10 NOTE — Discharge Summary (Signed)
Name: Judy Robles MRN: [id number] DOB: Jul 30, 1976 41 y.o. PCP: Jearld Lesch, MD  Date of Admission: 10/09/2016  2:27 AM Date of Discharge: 10/11/2016 Attending Physician: Levert Feinstein, MD  Discharge Diagnosis: Principal Problem:   Chest pain Active Problems:   Essential hypertension, benign   Polysubstance dependence including opioid type drug with complication, episodic abuse (HCC)   Community acquired pneumonia of left lung  Discharge Medications: Allergies as of 10/11/2016      Reactions   Tylenol [acetaminophen] Itching, Other (See Comments)   Welts   Lisinopril Other (See Comments)   Shuts down kidneys      Medication List    STOP taking these medications   amoxicillin-clavulanate 875-125 MG tablet Commonly known as:  AUGMENTIN   dextromethorphan-guaiFENesin 30-600 MG 12hr tablet Commonly known as:  MUCINEX DM   nicotine 7 mg/24hr patch Commonly known as:  NICODERM CQ - dosed in mg/24 hr     TAKE these medications   Buprenorphine HCl-Naloxone HCl 8-2 MG Film Commonly known as:  SUBOXONE Place 1 Film under the tongue 2 (two) times daily.   PROAIR HFA 108 (90 Base) MCG/ACT inhaler Generic drug:  albuterol Inhale 2 puffs into the lungs every 6 (six) hours as needed for wheezing or shortness of breath.   senna-docusate 8.6-50 MG tablet Commonly known as:  Senokot-S Take 2 tablets by mouth 2 (two) times daily.   venlafaxine 75 MG tablet Commonly known as:  EFFEXOR Take 150 mg by mouth 2 (two) times daily.       Disposition and follow-up:   Judy Robles was discharged from [facility] in Good condition.  At the hospital follow up visit please address:  1.   Chest pain: Resolved. Non-cardiac. Possibly 2/2 left shoulder tendonitis.  Chronic opioid use: Has she been able to establish at a rehab facility? Able to obtain Suboxone? Hep C: Needs follow up with ID PNA: Finished course of ABX. Will need repeat CXR in about 6  weeks.   2.  Labs / imaging needed at time of follow-up: None  3.  Pending labs/ test needing follow-up: None  Follow-up Appointments: Follow-up Information    [facility] Follow up on 10/12/2016.   Why:  @ 2:45 PM, please arrive to your appointment at least 15 mintues prior  Contact information: [address] [place] [address] [phone]          Hospital Course by problem list:  Atypical Chest pain: Judy Robles presented to the ED with sudden onset left sided chest pain, left shoulder pain. Chest pain was described as aching that was worse with coughing. History of left rotator cuff injury per her report about 3 months ago. Pain was worse with external rotation. EKG was unremarkable for any ischemic changes and had mildly elevated troponins at 0.18 > 0.19 > 0.16. Chest pain resolved spontaneous. Cardiology was consulted and patient underwent nuclear stress test which showed no reversible ischemia or infarction, normal LV wall motion, EF 66%; low risk study. ECHO was done and showed normal EF, mild LVH but otherwise normal study.   Right foot pain with dorsal swelling: Chronic right foot pain with recent surgery. Improved with ACE bandaging and Toradol. Provided Voltaren gel.   Community acquired pneumonia of left lung Finished course of azithromycin (7 days total) during hospitalization.  Will need follow up outpatient for repeat CXR.  Polysubstance dependence including opioid type drug with complication, episodic abuse (  HCC) She was recently admitted for benzodiazapine overdose. Prior to admission on 2/12 patient was on suboxone therapy prescribed by Dr. Tollie Eth through patient's rehab facility. Will continue buprenorphine at discharge. Social work helped arranged outpatient follow up with [facility] outpatient. Provided patient with 7 day prescription of buprenorphine and follow up in clinic next week until she is able to establish  with a facility as an outpatient.   Essential hypertension, benign BP stable. Resumed home medications.   Anxiety/MDD: Reduced dosing of Venlafaxine to 75 mg daily due to patients complaints about somnolence.   Discharge Vitals:   BP (!) 157/67 (BP Location: Left Arm)   Pulse 79   Temp 98.3 F (36.8 C) (Oral)   Resp 18   Ht 5\' 3"  (1.6 m)   Wt 243 lb 6.4 oz (110.4 kg)   SpO2 94%   BMI 43.12 kg/m   Pertinent Labs, Studies, and Procedures:   Troponin 0.18 > 0.19 > 0.16 > 0.08  2D ECHO Study Conclusions  - Left ventricle: The cavity size was normal. Wall thickness was   increased in a pattern of mild LVH. Systolic function was normal.   The estimated ejection fraction was in the range of 60% to 65%.   Wall motion was normal; there were no regional wall motion   abnormalities. - Mitral valve: Calcified annulus. There was mild regurgitation.   Valve area by continuity equation (using LVOT flow): 1.8 cm^2. - Left atrium: The atrium was moderately dilated. - Atrial septum: No defect or patent foramen ovale was identified. - Pulmonary arteries: PA peak pressure: 46 mm Hg (S).  NM Stress Test  TECHNIQUE: Standard myocardial SPECT imaging was performed after resting intravenous injection of 10 mCi Tc-42m tetrofosmin. Subsequently, intravenous infusion of Lexiscan was performed under the supervision of the Cardiology staff. At peak effect of the drug, 30 mCi Tc-60m tetrofosmin was injected intravenously and standard myocardial SPECT imaging was performed. Quantitative gated imaging was also performed to evaluate left ventricular wall motion, and estimate left ventricular ejection fraction.  COMPARISON:  None.  FINDINGS: Perfusion: No decreased activity in the left ventricle on stress imaging to suggest reversible ischemia or infarction. Wall Motion: Normal left ventricular wall motion. No left ventricular dilation. Left Ventricular Ejection Fraction: 66 % End  diastolic volume 106 ml End systolic volume 36 ml  IMPRESSION: 1. No reversible ischemia or infarction. 2. Normal left ventricular wall motion. 3. Left ventricular ejection fraction is 66%. 4. Non invasive risk stratification*: Low  Discharge Instructions: Discharge Instructions    Call MD for:  difficulty breathing, headache or visual disturbances    Complete by:  As directed    Call MD for:  persistant dizziness or light-headedness    Complete by:  As directed    Call MD for:  severe uncontrolled pain    Complete by:  As directed    Diet - low sodium heart healthy    Complete by:  As directed    Diet - low sodium heart healthy    Complete by:  As directed    Increase activity slowly    Complete by:  As directed    Increase activity slowly    Complete by:  As directed       Signed: 84m, MD 10/11/2016, 11:13 AM   Pager: [phone]

## 2016-10-10 NOTE — Progress Notes (Signed)
Back from Stress Test, spoke with B.Simmons from Cardiology, stated not to discharge patient today ,will do blood works in am.

## 2016-10-10 NOTE — Consult Note (Signed)
Patient ID: Judy Robles MRN: [id number], DOB/AGE: Jul 13, 1976   Admit date: 10/09/2016   Reason for Consult: Chest/ lLeft Should Pain; Abnormal Troponin Requesting MD: Dr. Cyndie Chime, Internal Medicine    Primary Physician: Jearld Lesch, MD Primary Cardiologist: New (Dr. Excell Seltzer)  Pt. Profile:  41 y/o female, w/ h/o HTN and tobacco use but no cardiac history. Also with obesity, depression, recent presumed left rotator cuff injury, and chronic opioid use disorder on Suboxone who was recently admitted to the hospital after a benzodiazepine overdose resulting in intubation and left lower lobe pneumonia. Improved with antibiotics. Discharged on 2/17 and readmitted 2/18 with acute chest and left shoulder pain. Cardiology consulted for chest pain in the setting of mildly elevated troponins x 3.   Problem List  Past Medical History:  Diagnosis Date  . Arthritis   . Hypertension   . MRSA (methicillin resistant Staphylococcus aureus)   . Obesity   . Sleep apnea     Past Surgical History:  Procedure Laterality Date  . I&D EXTREMITY Right 04/16/2014   Procedure: IRRIGATION AND DEBRIDEMENT EXTREMITY;  Surgeon: Sharma Covert, MD;  Location: [facility] OR;  Service: Orthopedics;  Laterality: Right;     Allergies  Allergies  Allergen Reactions  . Tylenol [Acetaminophen] Itching and Other (See Comments)    Welts  . Lisinopril Other (See Comments)    Shuts down kidneys    HPI  41 y/o female, w/ h/o HTN but no cardiac history. Also with morbid obesity, depression, recent left rotator cuff injury, and chronic opioid use disorder on Suboxone who was recently admitted to the hospital after a benzodiazepine overdose resulting in intubation and left lower lobe pneumonia. Improved with antibiotics. Discharged on 2/17 and readmitted 2/18 with acute chest and left shoulder pain. Cardiology consulted for chest pain in the setting of mildly elevated troponins x 3. Flat low level trend  0.18>>0.19>>0.16. SCr 1.07. BUN 13.  BNP also mildly elevated at 474. Repeat CXR showed persistent multifocal opacities involving the left mid and lower lung, concerning for pneumonia. Overall, opacities are slightly more dense as compared to most recent radiograph from 10/05/2016. She is afebrile. WBC is WNL. Her EKG shows NSR w/o ischemia. 2D echo 2/18 showed normal LVEF at 60-65% with normal wall motion. No effusion and no significant valvular abnormalities. She has been readmitted by internal medicine and is being treated with Augmentin.   Pt reports her chest discomfort has resolved. It was described as an "achey" discomfort in her chest day of admission. She denies any further recurrence. She also denies any recent h/o exertional CP or dyspnea.   She continues to have intermittent left shoulder pain, worse with movement of arm. Described as a sharp shooting pain. She does not recall any specific injury to her shoulder but notes that she started having problems with her left shoulder a few months ago. No MRI but she was told it was likely a rotator cuff injury. She received an injection a few weeks back that resulted in temporary improvement. She has limitations with forward flexion and internal rotation. These movements exacerbates her shoulder pain. She denies any family h/o CAD or SCD.     Home Medications  Prior to Admission medications   Medication Sig Start Date End Date Taking? Authorizing Provider  amoxicillin-clavulanate (AUGMENTIN) 875-125 MG tablet Take 1 tablet by mouth every 12 (twelve) hours. 10/08/16 10/10/16  Nyra Market, MD  Buprenorphine HCl-Naloxone HCl (SUBOXONE) 8-2 MG FILM Place 1 Film under the  tongue 2 (two) times daily. 10/07/16   Tyson Alias, MD  dextromethorphan-guaiFENesin [facility] DM) 30-600 MG 12hr tablet Take 1 tablet by mouth 2 (two) times daily. 10/08/16   Nyra Market, MD  nicotine (NICODERM CQ - DOSED IN MG/24 HR) 7 mg/24hr patch Place 1 patch (7 mg  total) onto the skin daily. Patient not taking: Reported on 10/01/2016 07/27/16   Adonis Brook, NP  PROAIR HFA 108 [phone] Base) MCG/ACT inhaler Inhale 2 puffs into the lungs every 6 (six) hours as needed for wheezing or shortness of breath.  09/30/16   Historical Provider, MD  senna-docusate (SENOKOT-S) 8.6-50 MG tablet Take 2 tablets by mouth 2 (two) times daily. 10/08/16   Nyra Market, MD  venlafaxine (EFFEXOR) 75 MG tablet Take 150 mg by mouth 2 (two) times daily.    Historical Provider, MD    Hospital Meds  . amoxicillin-clavulanate  1 tablet Oral Q12H  . buprenorphine  8 mg Sublingual Q12H  . Chlorhexidine Gluconate Cloth  6 each Topical Q0600  . enoxaparin (LOVENOX) injection  40 mg Subcutaneous Q24H  . mupirocin ointment  1 application Nasal BID  . venlafaxine  75 mg Oral BID WC    Family History  Family History  Problem Relation Age of Onset  . Throat cancer Mother   . Hypertension Father   . Stroke Father     Social History  Social History   Social History  . Marital status: Single    Spouse name: N/A  . Number of children: N/A  . Years of education: N/A   Occupational History  . Not on file.   Social History Main Topics  . Smoking status: Current Every Day Smoker    Packs/day: 0.25    Types: Cigarettes  . Smokeless tobacco: Never Used     Comment: 1/2PPD  . Alcohol use No  . Drug use: No  . Sexual activity: Yes    Birth control/ protection: Surgical   Other Topics Concern  . Not on file   Social History Narrative  . No narrative on file     Review of Systems General:  No chills, fever, night sweats or weight changes.  Cardiovascular:  No chest pain, dyspnea on exertion, edema, orthopnea, palpitations, paroxysmal nocturnal dyspnea. Dermatological: No rash, lesions/masses Respiratory: No cough, dyspnea Urologic: No hematuria, dysuria Abdominal:   No nausea, vomiting, diarrhea, bright red blood per rectum, melena, or hematemesis Neurologic:  No  visual changes, wkns, changes in mental status. All other systems reviewed and are otherwise negative except as noted above.  Physical Exam  Blood pressure (!) 99/51, pulse 70, temperature 98 F (36.7 C), temperature source Oral, resp. rate 18, height 5\' 3"  (1.6 m), weight 243 lb 11.2 oz (110.5 kg), SpO2 96 %.  General: Pleasant, NAD Psych: Normal affect. Neuro: Alert and oriented X 3. Moves all extremities spontaneously. HEENT: Normal  Neck: Supple without bruits or JVD. Lungs:  Resp regular and unlabored, CTA. Heart: RRR no s3, s4, or murmurs. Abdomen: Soft, non-tender, non-distended, BS + x 4.  Extremities: No clubbing, cyanosis or edema. DP/PT/Radials 2+ and equal bilaterally. Limited active ROM with forward flexion of left arm, decreased ROM with internal rotation. Tenderness over left shoulder joint  Labs  Troponin (Point of Care Test)  Recent Labs  10/09/16 0238  TROPIPOC 0.20*    Recent Labs  10/09/16 0401 10/09/16 1058 10/09/16 1546  TROPONINI 0.18* 0.19* 0.16*   Lab Results  Component Value Date   WBC 6.5  10/09/2016   HGB 8.8 (L) 10/09/2016   HCT 27.0 (L) 10/09/2016   MCV 91.2 10/09/2016   PLT 229 10/09/2016    Recent Labs Lab 10/03/16 1558  10/09/16 0226  NA  --   < > 136  K  --   < > 4.3  CL  --   < > 104  CO2  --   < > 22  BUN  --   < > 13  CREATININE  --   < > 1.07*  CALCIUM  --   < > 8.6*  PROT 5.3*  --   --   BILITOT 0.5  --   --   ALKPHOS 42  --   --   ALT 43  --   --   AST 102*  --   --   GLUCOSE  --   < > 82  < > = values in this interval not displayed. Lab Results  Component Value Date   CHOL 165 07/23/2016   HDL 56 07/23/2016   LDLCALC 89 07/23/2016   TRIG 175 (H) 10/03/2016   No results found for: DDIMER   Radiology/Studies  Dg Chest 2 View  Result Date: 10/09/2016 CLINICAL DATA:  Initial evaluation for acute chest pain, or recent pneumonia. EXAM: CHEST  2 VIEW COMPARISON:  Prior radiograph from 10/05/2016. FINDINGS:  Cardiac and mediastinal silhouettes are stable in size and contour, and remain within normal limits. Lungs normally inflated. Mild elevation left hemidiaphragm, stable. Persistent scattered infiltrates predominantly involving the lingula and left lower lobe, compatible with multifocal pneumonia. Overall, opacities are slightly more dense as compared to previous. No new consolidative opacity within the right lung. No pulmonary edema or pleural effusion. No pneumothorax. Osseous structures unchanged. IMPRESSION: Persistent multifocal opacities involving the left mid and lower lung, concerning for pneumonia. Overall, opacities are slightly more dense as compared to most recent radiograph from 10/05/2016. Electronically Signed   By: Rise Mu M.D.   On: 10/09/2016 03:08   Ct Head Wo Contrast  Result Date: 10/01/2016 CLINICAL DATA:  Pain after fall. EXAM: CT HEAD WITHOUT CONTRAST TECHNIQUE: Contiguous axial images were obtained from the base of the skull through the vertex without intravenous contrast. COMPARISON:  None. FINDINGS: Brain: No evidence of acute infarction, hemorrhage, hydrocephalus, extra-axial collection or mass lesion/mass effect. Vascular: No hyperdense vessel or unexpected calcification. Skull: Normal. Negative for fracture or focal lesion. Sinuses/Orbits: A small amount of fluid is seen in the left maxillary sinus. There is mucosal thickening in the right maxillary sinus. Paranasal sinuses, mastoid air cells, and middle ears are otherwise normal. Other: None. IMPRESSION: Sinus disease as above.  No acute intracranial abnormality. Electronically Signed   By: Gerome Sam III M.D   On: 10/01/2016 18:38   Dg Chest Port 1 View  Result Date: 10/05/2016 CLINICAL DATA:  Pneumonia and cough. EXAM: PORTABLE CHEST 1 VIEW COMPARISON:  10/04/2016 FINDINGS: Endotracheal tube and nasogastric have been removed. There are residual airspace densities in the mid and lower left lung. Few  peribronchial densities at the right lung base. Heart size is within normal limits. Negative for a pneumothorax. Atherosclerotic calcifications at the aortic arch. IMPRESSION: Persistent subtle densities in the left lung. Findings remain concerning for pneumonia or asymmetric edema. Electronically Signed   By: Richarda Overlie M.D.   On: 10/05/2016 07:55   Dg Chest Port 1 View  Result Date: 10/04/2016 CLINICAL DATA:  Respiratory failure, shortness of breath, encephalopathy. EXAM: PORTABLE CHEST 1 VIEW COMPARISON:  Portable chest x-ray of October 03, 2016 FINDINGS: The lungs are well-expanded. Confluent interstitial infiltrate is present in the mid and lower left lung and is more conspicuous today. There are similar findings in the right infrahilar region also more conspicuous. The cardiac silhouette is top-normal in size. The pulmonary vascularity is not engorged. There is no definite pleural effusion. The endotracheal tube tip lies 1.8 cm above the carina. The esophagogastric tube tip projects below the inferior margin of the image with the proximal port at or just below the GE junction. IMPRESSION: Interval development of further interstitial opacity in the mid and lower left lung and right lower lobe worrisome for pneumonia. Top-normal cardiac size without pulmonary edema. Advancement of the nasogastric tube by approximately 5 cm would assure that the proximal port remains below the GE junction. Electronically Signed   By: David  [place] M.D.   On: 10/04/2016 07:01   Dg Chest Portable 1 View  Result Date: 10/03/2016 CLINICAL DATA:  Intubation. Hemoptysis earlier today. Current history of hypertension and MRSA. EXAM: PORTABLE CHEST 1 VIEW 2:37 p.m.: COMPARISON:  Portable chest x-ray earlier today 10:06 a.m. and previously. FINDINGS: Endotracheal tube tip in satisfactory position approximately 3 cm above the carina. Nasogastric tube tip in the fundus of the stomach. Cardiac silhouette normal in size for AP  portable technique. Suboptimal inspiration. Improved aeration in the left lung since earlier today, though patchy airspace opacities persist. Right lung remains clear. IMPRESSION: 1. Endotracheal tube tip in satisfactory position approximately 3 cm above the carina. 2. Nasogastric tube tip in the fundus of the stomach. 3. Improved aeration in the left lung since earlier in the day, though patchy airspace opacities persist. 4. Right lung remains clear. Electronically Signed   By: Hulan Saas M.D.   On: 10/03/2016 14:51   Dg Chest Portable 1 View  Result Date: 10/03/2016 CLINICAL DATA:  41 year old presenting with acute onset of cough, shortness of breath and hemoptysis. Current history of hypertension and sleep apnea. Current smoker. EXAM: PORTABLE CHEST 1 VIEW COMPARISON:  09/04/2016, 02/12/2016. FINDINGS: Cardiac silhouette upper normal in size for the AP portable technique, accentuated by the less than optimal inspiration. Asymmetric interstitial and airspace opacities involving the left lung. Right lung clear. Possible left pleural effusion. No pneumothorax. IMPRESSION: Asymmetric interstitial airspace opacities throughout the left lung, likely pneumonia, possibly pulmonary hemorrhage given the history of hemoptysis. Asymmetric pulmonary edema is felt less likely as the right lung is clear. Electronically Signed   By: Hulan Saas M.D.   On: 10/03/2016 10:19   Dg Knee Complete 4 Views Right  Result Date: 10/01/2016 CLINICAL DATA:  Knee pain after falling EXAM: RIGHT KNEE - COMPLETE 4+ VIEW COMPARISON:  None. FINDINGS: No evidence of fracture, dislocation, or joint effusion. No evidence of arthropathy or other focal bone abnormality. Soft tissues are unremarkable. IMPRESSION: Negative. Electronically Signed   By: Gerome Sam III M.D   On: 10/01/2016 18:31   Dg Foot Complete Right  Result Date: 10/01/2016 CLINICAL DATA:  Fall out of bed, right foot pain EXAM: RIGHT FOOT COMPLETE - 3+ VIEW  COMPARISON:  04/24/2016, 04/09/2016 FINDINGS: Old fracture deformity proximal third proximal phalanx. Stable screw fixation of base of fifth metatarsal fracture. Increased lucency around the head of the screw at the metatarsal base suspicious for loosening. Separation of fracture fragments appears slightly more prominent on the lateral view compared with 04/24/2016 but grossly similar compared to 04/09/2016 IMPRESSION: 1. Old fracture deformity of the proximal third proximal phalanx 2.  Status post screw fixation of slightly comminuted base of fifth metatarsal fracture with ununited fracture fragments. Increasing lucency around the head of the screw suspicious for loosening or progressive bone resorption. 3. No definite acute fracture or malalignment is visualized. Electronically Signed   By: Jasmine Pang M.D.   On: 10/01/2016 18:37    ECG  NSR. No ischemia   Echocardiogram 10/09/16  Study Conclusions  - Left ventricle: The cavity size was normal. Wall thickness was   increased in a pattern of mild LVH. Systolic function was normal.   The estimated ejection fraction was in the range of 60% to 65%.   Wall motion was normal; there were no regional wall motion   abnormalities. - Mitral valve: Calcified annulus. There was mild regurgitation.   Valve area by continuity equation (using LVOT flow): 1.8 cm^2. - Left atrium: The atrium was moderately dilated. - Atrial septum: No defect or patent foramen ovale was identified. - Pulmonary arteries: PA peak pressure: 46 mm Hg (S).     ASSESSMENT AND PLAN  1. Atypical Chest and Shoulder Pain: her CP was atypical and has resolved. No exertional component. Her EKG shows NSR w/o ischemia. Her 2D echo shows normal LVEF w/o wall motion abnormalities. She is being treated for PNA and has a presumed left rotator cuff injury and her symptoms seem more c/w with that etiology, given reproducible symptoms with forward flexion and internal rotation. She also lacks  active ROM with both movements. Doubt coronary ischemia, however she has mildly elevated troponins (0.18>>0.19>>0.16) and cardiac risk factors which include HTN and tobacco abuse. Suspect likely demand ischemia from her PNA, however given her risk factors, we will plan nuclear stress testing today to risk stratify.    Signed, Robbie Lis, PA-C 10/10/2016, 8:41 AM  Patient seen and examined. I agree with the assessment and plan as detailed above. See also my additional thoughts below.   I have discussed all of the findings carefully with Robbie Lis, PA-C. I agree with her assessment as outlined above. The troponins are elevated, but the trend is flat. Unfortunately we cannot find any prior troponins. There is no evidence of right ventricular dilatation or dysfunction to suggest significant pulmonary emboli. Left ventricular function is normal by echo. I do feel that it is appropriate in this setting to proceed with in-hospital pharmacologic stress nuclear evaluation. Also, I would like to follow her troponin trend further. I have ordered a troponin for early tomorrow morning.  Willa Rough, MD, [facility] 10/10/2016 10:04 AM

## 2016-10-10 NOTE — Progress Notes (Signed)
   Nuclear stress test completed. Radiologist interpretation pending.   Judy Robles 10/10/2016

## 2016-10-10 NOTE — Discharge Instructions (Addendum)
Judy Robles, your chest pain was likely coming from your shoulder and not related to your heart. The stress test we got here was negative for any evidence of heart disease. Please take tylenol or ibuprofen and the voltaren gel as needed for pain.  It will be very important for you to follow up with one of the rehabilitation facilities for further treatment. [facility] has bed availabilities today but it is fist come first served. We have provided you with a week supply of the Suboxone until you are able to get back into treatment. Please follow up in our clinic downstairs on 10/19/16 at 2:45 PM for a one time visit if you are unable to get into an inpatient rehabilitation facility. Our number is [phone] if you have any questions or concerns.

## 2016-10-11 DIAGNOSIS — F418 Other specified anxiety disorders: Secondary | ICD-10-CM

## 2016-10-11 DIAGNOSIS — J189 Pneumonia, unspecified organism: Secondary | ICD-10-CM

## 2016-10-11 DIAGNOSIS — Z888 Allergy status to other drugs, medicaments and biological substances status: Secondary | ICD-10-CM | POA: Diagnosis not present

## 2016-10-11 DIAGNOSIS — B1920 Unspecified viral hepatitis C without hepatic coma: Secondary | ICD-10-CM | POA: Diagnosis not present

## 2016-10-11 DIAGNOSIS — R748 Abnormal levels of other serum enzymes: Secondary | ICD-10-CM | POA: Diagnosis not present

## 2016-10-11 DIAGNOSIS — F1910 Other psychoactive substance abuse, uncomplicated: Secondary | ICD-10-CM

## 2016-10-11 DIAGNOSIS — I10 Essential (primary) hypertension: Secondary | ICD-10-CM

## 2016-10-11 DIAGNOSIS — Z886 Allergy status to analgesic agent status: Secondary | ICD-10-CM

## 2016-10-11 DIAGNOSIS — F1110 Opioid abuse, uncomplicated: Secondary | ICD-10-CM

## 2016-10-11 DIAGNOSIS — R0789 Other chest pain: Secondary | ICD-10-CM | POA: Diagnosis not present

## 2016-10-11 LAB — TROPONIN I: Troponin I: 0.08 ng/mL (ref <0.03)

## 2016-10-11 MED ORDER — DICLOFENAC SODIUM 1 % TD GEL
2.0000 g | Freq: Four times a day (QID) | TRANSDERMAL | Status: DC | PRN
  Filled 2016-10-11: qty 100

## 2016-10-11 NOTE — Progress Notes (Signed)
Patient Name: Judy Robles Date of Encounter: 10/11/2016  Primary Cardiologist: New- [facility] Problem List     Principal Problem:   Chest pain Active Problems:   Essential hypertension, benign   Polysubstance dependence including opioid type drug with complication, episodic abuse [facility])   Community acquired pneumonia of left lung     Subjective   No complaints. Up watching TV.   Inpatient Medications    Scheduled Meds: . buprenorphine  8 mg Sublingual Q12H  . Chlorhexidine Gluconate Cloth  6 each Topical Q0600  . enoxaparin (LOVENOX) injection  40 mg Subcutaneous Q24H  . mupirocin ointment  1 application Nasal BID  . venlafaxine  75 mg Oral BID WC   Continuous Infusions:  PRN Meds: diclofenac sodium   Vital Signs    Vitals:   10/10/16 1130 10/10/16 1404 10/10/16 2227 10/11/16 0637  BP: (!) 127/58 132/68 (!) 124/57 (!) 157/67  Pulse: 87 86 66 79  Resp:  18 20 18   Temp:  97.8 F (36.6 C) 97.5 F (36.4 C) 98.3 F (36.8 C)  TempSrc:  Oral Oral Oral  SpO2:  97% 96% 94%  Weight:    243 lb 6.4 oz (110.4 kg)  Height:        Intake/Output Summary (Last 24 hours) at 10/11/16 1015 Last data filed at 10/10/16 2227  Gross per 24 hour  Intake              120 ml  Output              825 ml  Net             -705 ml   Filed Weights   10/09/16 1012 10/10/16 0439 10/11/16 0637  Weight: 245 lb 12.8 oz (111.5 kg) 243 lb 11.2 oz (110.5 kg) 243 lb 6.4 oz (110.4 kg)    Physical Exam   GEN: Well nourished, well developed, in no acute distress. Obese, disheveled appearing HEENT: Grossly normal.  Neck: Supple, no JVD, carotid bruits, or masses. Cardiac: RRR, no murmurs, rubs, or gallops. No clubbing, cyanosis, edema.  Radials/DP/PT 2+ and equal bilaterally.  Respiratory:  Respirations regular and unlabored, clear to auscultation bilaterally. GI: Soft, nontender, nondistended, BS + x 4. MS: no deformity or atrophy. Skin: warm and dry, no rash. Neuro:   Strength and sensation are intact. Psych: AAOx3.  Normal affect.  Labs    CBC  Recent Labs  10/09/16 0226 10/09/16 0302  WBC 6.5  --   NEUTROABS  --  2.5  HGB 8.8*  --   HCT 27.0*  --   MCV 91.2  --   PLT 229  --    Basic Metabolic Panel  Recent Labs  10/09/16 0226  NA 136  K 4.3  CL 104  CO2 22  GLUCOSE 82  BUN 13  CREATININE 1.07*  CALCIUM 8.6*   Liver Function Tests No results for input(s): AST, ALT, ALKPHOS, BILITOT, PROT, ALBUMIN in the last 72 hours. No results for input(s): LIPASE, AMYLASE in the last 72 hours. Cardiac Enzymes  Recent Labs  10/09/16 1058 10/09/16 1546 10/11/16 0543  TROPONINI 0.19* 0.16* 0.08*   BNP Invalid input(s): POCBNP D-Dimer No results for input(s): DDIMER in the last 72 hours. Hemoglobin A1C No results for input(s): HGBA1C in the last 72 hours. Fasting Lipid Panel No results for input(s): CHOL, HDL, LDLCALC, TRIG, CHOLHDL, LDLDIRECT in the last 72 hours. Thyroid Function Tests No results for input(s): TSH, T4TOTAL,  T3FREE, THYROIDAB in the last 72 hours.  Invalid input(s): FREET3  Telemetry    NSR with PVCS - Personally Reviewed   Radiology    Nm Myocar Multi W/spect W/wall Motion / Ef  Result Date: 10/10/2016 CLINICAL DATA:  41 year old with chest pain. EXAM: MYOCARDIAL IMAGING WITH SPECT (REST AND PHARMACOLOGIC-STRESS) GATED LEFT VENTRICULAR WALL MOTION STUDY LEFT VENTRICULAR EJECTION FRACTION TECHNIQUE: Standard myocardial SPECT imaging was performed after resting intravenous injection of 10 mCi Tc-68m tetrofosmin. Subsequently, intravenous infusion of Lexiscan was performed under the supervision of the Cardiology staff. At peak effect of the drug, 30 mCi Tc-62m tetrofosmin was injected intravenously and standard myocardial SPECT imaging was performed. Quantitative gated imaging was also performed to evaluate left ventricular wall motion, and estimate left ventricular ejection fraction. COMPARISON:  None. FINDINGS:  Perfusion: No decreased activity in the left ventricle on stress imaging to suggest reversible ischemia or infarction. Wall Motion: Normal left ventricular wall motion. No left ventricular dilation. Left Ventricular Ejection Fraction: 66 % End diastolic volume 106 ml End systolic volume 36 ml IMPRESSION: 1. No reversible ischemia or infarction. 2. Normal left ventricular wall motion. 3. Left ventricular ejection fraction is 66%. 4. Non invasive risk stratification*: Low *2012 Appropriate Use Criteria for Coronary Revascularization Focused Update: J Am Coll Cardiol. 2012;59(9):857-881. http://content.dementiazones.com.aspx?articleid=1201161 Electronically Signed   By: Richarda Overlie M.D.   On: 10/10/2016 15:21    Cardiac Studies   10/10/16 Nuclear stress test: low risk EF 66%  Echocardiogram 10/09/16 Study Conclusions  - Left ventricle: The cavity size was normal. Wall thickness was increased in a pattern of mild LVH. Systolic function was normal. The estimated ejection fraction was in the range of 60% to 65%. Wall motion was normal; there were no regional wall motion abnormalities. - Mitral valve: Calcified annulus. There was mild regurgitation. Valve area by continuity equation (using LVOT flow): 1.8 cm^2. - Left atrium: The atrium was moderately dilated. - Atrial septum: No defect or patent foramen ovale was identified. - Pulmonary arteries: PA peak pressure: 46 mm Hg (S).      Patient Profile     41 y/o female, w/ h/o HTN but no cardiac history. Also with morbid obesity, depression, recent left rotator cuff injury, and chronic opioid use disorder on Suboxone who was recently admitted to the hospital after a benzodiazepine overdose resulting in intubation and left lower lobe pneumonia. Cardiology consulted for mildly elevated troponin and chest pain.   Assessment & Plan    Atypical Chest and Shoulder Pain: her CP was atypical and has resolved. No exertional component. Her  EKG shows NSR w/o ischemia. Her 2D echo shows normal LVEF w/o wall motion abnormalities. She is being treated for PNA and has a presumed left rotator cuff injury and her symptoms seem more c/w with that etiology, given reproducible symptoms with forward flexion and internal rotation. She also lacks active ROM with both movements. Doubt coronary ischemia, however she has mildly elevated troponins (0.18>>0.19>>0.16>>0.08) and cardiac risk factors which include HTN and tobacco abuse. Suspect likely demand ischemia from her PNA, however given her risk factors nuclear stress test was ordered. Nuclear stress test yesterday was low risk, EF 66%. We will sign off. Okay for discharge from our point of view. No outpatient cardiology follow up needed.   Signed, Cline Crock, PA-C  10/11/2016, 10:15 AM   Patient seen, examined. Available data reviewed. Agree with findings, assessment, and plan as outlined by Carlean Jews, PA-C.   Patient is independently interviewed and examined. She  is alert and oriented in no distress. Lungs are clear. Heart is regular rate and rhythm without murmur. Extremities have no edema. She has been chest pain-free. Her nuclear scan and echo results are reviewed. She is appropriate for discharge when she is medically stable. I do not think she has any evidence of ischemic heart disease and she should not require any cardiology follow-up unless problems arise in the future. Okay to follow-up with her primary care physician.  Tonny Bollman, M.D. 10/11/2016 12:49 PM

## 2016-10-11 NOTE — Telephone Encounter (Signed)
No answer, no vmail, at first # I was told it was not the pt's #

## 2016-10-11 NOTE — Progress Notes (Signed)
   Subjective: Ms. Rady is doing well this morning. She has no complaints today. Denies any chest pain, shortness of breath, palpitations, lightheadedness/dizziness, nausea or vomiting.   Objective: Vital signs in last 24 hours: Vitals:   10/10/16 1130 10/10/16 1404 10/10/16 2227 10/11/16 0637  BP: (!) 127/58 132/68 (!) 124/57 (!) 157/67  Pulse: 87 86 66 79  Resp:  18 20 18   Temp:  97.8 F (36.6 C) 97.5 F (36.4 C) 98.3 F (36.8 C)  TempSrc:  Oral Oral Oral  SpO2:  97% 96% 94%  Weight:    243 lb 6.4 oz (110.4 kg)  Height:       Weight change: -2 lb 6.4 oz (-1.089 kg)  Intake/Output Summary (Last 24 hours) at 10/11/16 0703 Last data filed at 10/10/16 2227  Gross per 24 hour  Intake              120 ml  Output              825 ml  Net             -705 ml    Physical Exam  Constitutional: She is well-developed, well-nourished, and in no distress.  Cardiovascular: Normal rate, regular rhythm and normal heart sounds.   Pulmonary/Chest: Effort normal and breath sounds normal.  Abdominal: Soft. Bowel sounds are normal.  Musculoskeletal:  Trace pedal edema bilaterally  Nursing note and vitals reviewed.  Medications: I have reviewed the patient's current medications. Scheduled Meds: . buprenorphine  8 mg Sublingual Q12H  . Chlorhexidine Gluconate Cloth  6 each Topical Q0600  . enoxaparin (LOVENOX) injection  40 mg Subcutaneous Q24H  . mupirocin ointment  1 application Nasal BID  . venlafaxine  75 mg Oral BID WC   Continuous Infusions: PRN Meds:.ketorolac Assessment/Plan:  Atypical Chest pain: Resolved. Nuclear stress test done yesterday with no reversible ischemia or infarction, normal LV wall motion, EF 66%; low risk study. Cardiology recommended repeat troponin this am to continue to trend. Trops 0.18 > 0.19 > 0.16 on admission. Troponin 0.08. Will discharge later today after Cardiology evaluation.  Right foot pain with dorsal swelling: Only trace edema today. ACE  bandage appears to be helping. Voltaren gel prn.  Community acquired pneumonia of left lung Finished course of azithromycin. Will need follow up outpatient for repeat CXR.  Polysubstance dependence including opioid type drug with complication, episodic abuse (HCC) She was recently admitted for benzodiazapine overdose. Prior to admission on 2/12 patient was on suboxone therapy prescribed by Dr. 4/12 through patient's rehab facility. Will continue buprenorphine. - Buprenorphine  - Social work consulted  Essential hypertension, benign BP mildly elevated this morning at 157/67. Holding home antihypertensives due to soft BP initially. Will resume on discharge.   Anxiety/MDD: Continue Venlafaxine 75 mg daily  Homelessness: Social work consulted. She reports to me today that she has a friend that can get her and stay with at discharge today.   Dispo: Discharge later today  The patient does have a current PCP Tollie Eth Karren Burly, MD) and does need an [facility] hospital follow-up appointment after discharge.  The patient does have transportation limitations that hinder transportation to clinic appointments.  [facility], MD IMTS PGY-2 [phone] 10/11/2016, 7:03 AM

## 2016-10-11 NOTE — Progress Notes (Signed)
Pt given all of her home meds that were in pharmacy and a taxi volcher.  RN did discharge teaching with pt.  Pt verbalized understanding.  Instructed pt to call taxi service on volcher and to let RN know when to bring her a wc.  Pt refused wc.  Pt stated, "I want to walk downstairs to see my mom".  Pt left the floor with all belongings.

## 2016-10-11 NOTE — Clinical Social Work Note (Addendum)
[facility] has a bed available today but stated that it is first-come-first serve. Patient notified and given a list of things she needs to bring. Patient questioning whether she wants to go there or to another facility that she has been to before in [place]. Patient is working on getting transportation by family/friend. CSW will continue to follow.  Charlynn Court, CSW [phone]  1:36 pm  Patient's sister and mother are at bedside from out of town. Patient's sister wants patient to go to [facility] in [place]. Patient is upset and says that she is willing to go wherever her sister wants her to go. CSW discussed with admissions coordinator at the rehab facility. Awaiting email with intake information from admissions coordinator.  Charlynn Court, CSW [phone]  1:59 pm CSW has still not received email from [place] facility. Patient and her family are aware that [facility] is still an option.   Charlynn Court, CSW [phone]  4:46 pm Patient wants to return to her previous rehab, [facility] on [address], [place] [phone]). RN notified. Cab voucher given to nurse's station Diplomatic Services operational officer so RN can call when patient is ready. [facility] notified that patient will not be coming to their facility. CSW provided patient with application to the [facility] facility.  CSW signing off.   Charlynn Court, CSW [phone]

## 2016-10-12 ENCOUNTER — Ambulatory Visit: Payer: Self-pay

## 2016-10-14 ENCOUNTER — Emergency Department (HOSPITAL_COMMUNITY): Payer: Medicaid Other

## 2016-10-14 ENCOUNTER — Encounter (HOSPITAL_COMMUNITY): Payer: Self-pay | Admitting: Nurse Practitioner

## 2016-10-14 ENCOUNTER — Emergency Department (HOSPITAL_COMMUNITY)
Admission: EM | Admit: 2016-10-14 | Discharge: 2016-10-14 | Disposition: A | Discharge status: Home / Self Care | Payer: Medicaid Other | Attending: Emergency Medicine | Admitting: Emergency Medicine

## 2016-10-14 DIAGNOSIS — F17210 Nicotine dependence, cigarettes, uncomplicated: Secondary | ICD-10-CM | POA: Insufficient documentation

## 2016-10-14 DIAGNOSIS — S93432A Sprain of tibiofibular ligament of left ankle, initial encounter: Secondary | ICD-10-CM | POA: Diagnosis not present

## 2016-10-14 DIAGNOSIS — Y92009 Unspecified place in unspecified non-institutional (private) residence as the place of occurrence of the external cause: Secondary | ICD-10-CM | POA: Diagnosis not present

## 2016-10-14 DIAGNOSIS — R52 Pain, unspecified: Secondary | ICD-10-CM

## 2016-10-14 DIAGNOSIS — Y939 Activity, unspecified: Secondary | ICD-10-CM | POA: Diagnosis not present

## 2016-10-14 DIAGNOSIS — S93492A Sprain of other ligament of left ankle, initial encounter: Secondary | ICD-10-CM

## 2016-10-14 DIAGNOSIS — Z79899 Other long term (current) drug therapy: Secondary | ICD-10-CM | POA: Diagnosis not present

## 2016-10-14 DIAGNOSIS — S99912A Unspecified injury of left ankle, initial encounter: Secondary | ICD-10-CM | POA: Diagnosis present

## 2016-10-14 DIAGNOSIS — Y999 Unspecified external cause status: Secondary | ICD-10-CM | POA: Insufficient documentation

## 2016-10-14 DIAGNOSIS — I10 Essential (primary) hypertension: Secondary | ICD-10-CM | POA: Insufficient documentation

## 2016-10-14 DIAGNOSIS — X58XXXA Exposure to other specified factors, initial encounter: Secondary | ICD-10-CM | POA: Insufficient documentation

## 2016-10-14 MED ORDER — IBUPROFEN 800 MG PO TABS
800.0000 mg | ORAL_TABLET | Freq: Once | ORAL | Status: AC
  Administered 2016-10-14: 800 mg via ORAL
  Filled 2016-10-14: qty 1

## 2016-10-14 NOTE — ED Triage Notes (Signed)
Pt arrived via EMS from rehab facility [facility]. Per ems patient states she is having some left ankle pain and redness. Pt is ambulatory and walked to get on their gurney. A&O x 4. Pt talkative about lifestyle the entire ride. Rates pain 8/10. States pain has been ongoing for several weeks not related to injury. VSS 154/72, 78, 20, 98% RA.

## 2016-10-14 NOTE — ED Notes (Signed)
D/c information reviewed with patient. She verbalizes understanding of chronic pain management and follow up with her PCP.

## 2016-10-14 NOTE — ED Provider Notes (Signed)
[facility]-EMERGENCY DEPT Provider Note   CSN: [id number] Arrival date & time: 10/14/16  0330     History   Chief Complaint Chief Complaint  Patient presents with  . Ankle Pain    HPI Judy Robles is a 41 y.o. female.  The history is provided by the patient. No language interpreter was used.  Ankle Pain   The incident occurred more than 2 days ago. The incident occurred at home. The injury mechanism is unknown. The pain is present in the left ankle. The quality of the pain is described as throbbing. The pain is moderate. The pain has been constant since onset. Pertinent negatives include no numbness, no inability to bear weight, no loss of motion, no muscle weakness, no loss of sensation and no tingling. She reports no foreign bodies present. Nothing aggravates the symptoms. She has tried nothing for the symptoms. The treatment provided no relief.    Past Medical History:  Diagnosis Date  . Arthritis   . Hypertension   . MRSA (methicillin resistant Staphylococcus aureus)   . Obesity   . Sleep apnea     Patient Active Problem List   Diagnosis Date Noted  . Chest pain 10/09/2016  . Pressure injury of skin 10/05/2016  . Community acquired pneumonia of left lung 10/03/2016  . Chronic renal insufficiency 07/22/2016  . Polysubstance dependence including opioid type drug with complication, episodic abuse (HCC) 07/21/2016  . MDD (major depressive disorder), recurrent severe, without psychosis (HCC) 07/21/2016  . Chronic pain 07/03/2015  . Diarrhea 05/01/2014  . Essential hypertension, benign 04/17/2014    Past Surgical History:  Procedure Laterality Date  . I&D EXTREMITY Right 04/16/2014   Procedure: IRRIGATION AND DEBRIDEMENT EXTREMITY;  Surgeon: Sharma Covert, MD;  Location: [facility] OR;  Service: Orthopedics;  Laterality: Right;    OB History    No data available       Home Medications    Prior to Admission medications   Medication Sig Start Date End Date Taking?  Authorizing Provider  Buprenorphine HCl-Naloxone HCl (SUBOXONE) 8-2 MG FILM Place 1 Film under the tongue 2 (two) times daily. 10/07/16   Tyson Alias, MD  PROAIR HFA 108 [phone] Base) MCG/ACT inhaler Inhale 2 puffs into the lungs every 6 (six) hours as needed for wheezing or shortness of breath.  09/30/16   Historical Provider, MD  senna-docusate (SENOKOT-S) 8.6-50 MG tablet Take 2 tablets by mouth 2 (two) times daily. 10/08/16   Nyra Market, MD  venlafaxine (EFFEXOR) 75 MG tablet Take 150 mg by mouth 2 (two) times daily.    Historical Provider, MD    Family History Family History  Problem Relation Age of Onset  . Throat cancer Mother   . Hypertension Father   . Stroke Father     Social History Social History  Substance Use Topics  . Smoking status: Current Every Day Smoker    Packs/day: 0.25    Types: Cigarettes  . Smokeless tobacco: Never Used     Comment: 1/2PPD  . Alcohol use No     Allergies   Tylenol [acetaminophen] and Lisinopril   Review of Systems Review of Systems  Respiratory: Negative for shortness of breath.   Cardiovascular: Negative for chest pain, palpitations and leg swelling.  Musculoskeletal: Positive for arthralgias. Negative for gait problem.  Neurological: Negative for tingling and numbness.  All other systems reviewed and are negative.    Physical Exam Updated Vital Signs BP 138/84 (BP Location: Right Arm)   Pulse  100   Temp 98.7 F (37.1 C) (Oral)   Resp 18   Ht 5\' 3"  (1.6 m)   Wt 243 lb (110.2 kg)   LMP 09/27/2016   SpO2 99%   BMI 43.05 kg/m   Physical Exam  Constitutional: She is oriented to person, place, and time. She appears well-developed and well-nourished. No distress.  HENT:  Head: Normocephalic and atraumatic.  Mouth/Throat: No oropharyngeal exudate.  Eyes: EOM are normal. Pupils are equal, round, and reactive to light.  Neck: Normal range of motion. Neck supple.  Cardiovascular: Normal rate, regular rhythm and  intact distal pulses.   Pulmonary/Chest: Effort normal and breath sounds normal.  Abdominal: Soft. Bowel sounds are normal. She exhibits no mass. There is no tenderness. There is no rebound and no guarding.  Musculoskeletal: Normal range of motion.  Neurological: She is alert and oriented to person, place, and time.  Skin: Skin is warm and dry. Capillary refill takes less than 2 seconds.  Psychiatric: She has a normal mood and affect.     ED Treatments / Results   Vitals:   10/14/16 0333 10/14/16 0506  BP: 141/78 138/84  Pulse: 94 100  Resp: 20 18  Temp: 98.6 F (37 C) 98.7 F (37.1 C)     Radiology Dg Ankle Complete Left  Result Date: 10/14/2016 CLINICAL DATA:  Ankle pain without injury.  Erythema and edema. EXAM: LEFT ANKLE COMPLETE - 3+ VIEW COMPARISON:  Left ankle radiograph 01/31/2015 FINDINGS: There is severe circumferential soft tissue swelling at the left ankle. There is no fracture. The ankle mortise is approximated. IMPRESSION: Severe circumferential left ankle soft tissue swelling without underlying osseous abnormality. In the appropriate clinical context, cellulitis should be considered. Electronically Signed   By: 04/02/2015 M.D.   On: 10/14/2016 04:49    Procedures Procedures (including critical care time)  Medications Ordered in ED Medications  ibuprofen (ADVIL,MOTRIN) tablet 800 mg (800 mg Oral Given 10/14/16 0503)      Final Clinical Impressions(s) / ED Diagnoses   Final diagnoses:  Sprain of anterior talofibular ligament of left ankle, initial encounter    New Prescriptions Discharge Medication List as of 10/14/2016  5:05 AM       Triana Coover, MD 10/14/16 [phone]

## 2016-10-14 NOTE — ED Notes (Signed)
Bed: WA03 Expected date:  Expected time:  Means of arrival:  Comments: EMS ambulatory ankle pain

## 2016-10-14 NOTE — ED Notes (Signed)
Patient states the pain has been going on for several weeks. States "I saw a provider at the rehab facility yesterday and he ruled out a blood clot. I am not sure how he knows since he didn't give me anything for pain or do x-rays. I have been taking Tylenol but that don't work." Denies any recent injuries to the leg or ankle.

## 2016-10-18 ENCOUNTER — Telehealth: Payer: Self-pay | Admitting: Specialist

## 2016-10-18 NOTE — Telephone Encounter (Signed)
APT. REMINDER CALL, NO ANSWER, NO VOICEMAIL °

## 2016-10-19 ENCOUNTER — Ambulatory Visit: Payer: Self-pay

## 2016-11-15 ENCOUNTER — Encounter (HOSPITAL_COMMUNITY): Payer: Self-pay

## 2016-11-15 ENCOUNTER — Emergency Department (HOSPITAL_COMMUNITY): Payer: Medicaid Other

## 2016-11-15 ENCOUNTER — Emergency Department (HOSPITAL_COMMUNITY)
Admission: EM | Admit: 2016-11-15 | Discharge: 2016-11-15 | Disposition: A | Discharge status: Home / Self Care | Payer: Medicaid Other | Attending: Emergency Medicine | Admitting: Emergency Medicine

## 2016-11-15 DIAGNOSIS — I10 Essential (primary) hypertension: Secondary | ICD-10-CM | POA: Diagnosis not present

## 2016-11-15 DIAGNOSIS — M79671 Pain in right foot: Secondary | ICD-10-CM | POA: Diagnosis not present

## 2016-11-15 DIAGNOSIS — F17210 Nicotine dependence, cigarettes, uncomplicated: Secondary | ICD-10-CM | POA: Insufficient documentation

## 2016-11-15 DIAGNOSIS — Z79899 Other long term (current) drug therapy: Secondary | ICD-10-CM | POA: Insufficient documentation

## 2016-11-15 NOTE — ED Provider Notes (Signed)
[facility]-EMERGENCY DEPT Provider Note   CSN: [id number] Arrival date & time: 11/15/16  2683    By signing my name below, I, Judy Robles, attest that this documentation has been prepared under the direction and in the presence of Judy Robles, [place]. Electronically Signed: Bobbie Robles, Scribe. 11/15/16. 9:56 AM. History   Chief Complaint Chief Complaint  Patient presents with  . Foot Pain    The history is provided by the patient. No language interpreter was used.  HPI Comments: Judy Robles is a 41 y.o. female who presents to the Emergency Department complaining of worsening sharp right foot pain for the past 7 days. She rates her pain 10/10 sharp pain that radiates to her great toe. She has tried taking 800mg  ibuprofen with no relief. The patient states that in July of 2017 she had broke her foot in 3 places. She states that she was at a friend's house and fell through some loose tiles in the floor at that time. She had surgery following this incident and has been having chronic intermittent pain since. States that several months after her surgery she was experiencing increased pain and was told by her orthopedic surgeon that "there was an infection there and the screw was loose but he would not operate on me because he said I did not take care of it before. I tried to get another 06-03-2005 but no one else will operate either". She states she has been moving around a lot and "between housing" so she has not been able to find a new orthopedic surgeon. She reports numbness and tingling on the plantar side of her foot currently. She denies any recent injuries/falls/trauma, urinary symptoms, back pain, and leg pain. She states that she does a lot of walking on a daily basis. She states that she has been trying to find a new orthopedic surgery for pain management.  Past Medical History:  Diagnosis Date  . Arthritis   . Hypertension   . MRSA (methicillin resistant Staphylococcus aureus)   .  Obesity   . Sleep apnea     Patient Active Problem List   Diagnosis Date Noted  . Chest pain 10/09/2016  . Pressure injury of skin 10/05/2016  . Community acquired pneumonia of left lung 10/03/2016  . Chronic renal insufficiency 07/22/2016  . Polysubstance dependence including opioid type drug with complication, episodic abuse (HCC) 07/21/2016  . MDD (major depressive disorder), recurrent severe, without psychosis (HCC) 07/21/2016  . Chronic pain 07/03/2015  . Diarrhea 05/01/2014  . Essential hypertension, benign 04/17/2014    Past Surgical History:  Procedure Laterality Date  . I&D EXTREMITY Right 04/16/2014   Procedure: IRRIGATION AND DEBRIDEMENT EXTREMITY;  Surgeon: 04/18/2014, MD;  Location: [facility] OR;  Service: Orthopedics;  Laterality: Right;    OB History    No data available       Home Medications    Prior to Admission medications   Medication Sig Start Date End Date Taking? Authorizing Provider  Buprenorphine HCl-Naloxone HCl (SUBOXONE) 8-2 MG FILM Place 1 Film under the tongue 2 (two) times daily. 10/07/16   10/09/16, MD  PROAIR HFA 108 [phone] Base) MCG/ACT inhaler Inhale 2 puffs into the lungs every 6 (six) hours as needed for wheezing or shortness of breath.  09/30/16   Historical Provider, MD  senna-docusate (SENOKOT-S) 8.6-50 MG tablet Take 2 tablets by mouth 2 (two) times daily. 10/08/16   10/10/16, MD  venlafaxine (EFFEXOR) 75 MG tablet Take 150 mg  by mouth 2 (two) times daily.    Historical Provider, MD    Family History Family History  Problem Relation Age of Onset  . Throat cancer Mother   . Hypertension Father   . Stroke Father     Social History Social History  Substance Use Topics  . Smoking status: Current Every Day Smoker    Packs/day: 0.25    Types: Cigarettes  . Smokeless tobacco: Never Used     Comment: 1/2PPD  . Alcohol use No     Allergies   Tylenol [acetaminophen] and Lisinopril   Review of Systems Review of  Systems  Constitutional: Negative for chills and fever.  Respiratory: Negative for cough.   Cardiovascular: Negative for chest pain.  Gastrointestinal: Negative for abdominal pain.  Genitourinary: Negative for difficulty urinating, dysuria, frequency and hematuria.  Musculoskeletal: Positive for myalgias. Negative for back pain.       Right foot pain  Skin: Negative for wound.  Neurological: Positive for numbness.     Physical Exam Updated Vital Signs BP (!) 153/87 (BP Location: Right Arm)   Pulse 86   Temp 98.3 F (36.8 C) (Oral)   Resp 16   LMP 09/29/2016 (Within Weeks)   SpO2 100%   Physical Exam  Constitutional: She is oriented to person, place, and time. She appears well-developed and well-nourished. No distress.  HENT:  Head: Normocephalic and atraumatic.  Eyes: Conjunctivae and EOM are normal. Right eye exhibits no discharge. Left eye exhibits no discharge. No scleral icterus.  Neck: No JVD present. No tracheal deviation present. No thyromegaly present.  Cardiovascular: Normal rate and intact distal pulses.   2+ dp/pt pulses b/l  Pulmonary/Chest: Effort normal. No respiratory distress.  Abdominal: She exhibits no distension.  Musculoskeletal: She exhibits tenderness. She exhibits no edema or deformity.  Pt able to plantarflex and dorsiflex right ankle fully, pain with dorsiflexion, limited inversion and eversion. Generally tender overlying dorsum of foot, no point of maximal tenderness. No ligamentous laxity noted, no swelling, erythema, or warmth of right ankle joint or MCP. No calf tenderness. Normal ROM of left ankle  Neurological: She is alert and oriented to person, place, and time.  Numbness to plantar surface of foot, chronic. 5/5 strength to b/l feet  Skin: Skin is warm and dry. Capillary refill takes less than 2 seconds. No rash noted. She is not diaphoretic. No erythema.  Psychiatric: She has a normal mood and affect. Her behavior is normal. Judgment and thought  content normal.     ED Treatments / Results  DIAGNOSTIC STUDIES: Oxygen Saturation is 100% on RA, normal by my interpretation.    COORDINATION OF CARE: 9:45 AM Discussed treatment plan with pt at bedside and pt agreed to plan. I will check the patient's right foot X-ray. I will discharge the patient with a post op boot and crutches.  Labs (all labs ordered are listed, but only abnormal results are displayed) Labs Reviewed - No data to display  EKG  EKG Interpretation None       Radiology Dg Foot Complete Right  Result Date: 11/15/2016 CLINICAL DATA:  41 year old female with right foot pain for the past 7 days. No injury. Pain extends to great toe. Sustained fracture July 2017. Subsequent encounter. EXAM: RIGHT FOOT COMPLETE - 3+ VIEW COMPARISON:  10/01/2016 FINDINGS: Remote fracture proximal right fifth metatarsal treated with screw placement. Ununited fracture with separation of fracture fragments. Lucency around the proximal screw may indicate loosening. Remote fracture right third proximal phalanx. No  acute fracture or dislocation is noted. Mild hallux valgus deformity. Minimal ossific structures adjacent to the right first metatarsal head may reflect changes of remote injury. Plantar spur. Small spur at the level of the Achilles tendon insertion site. Nonspecific soft tissue calcifications may be related to venous structure. IMPRESSION: No significant change since recent exam as detailed above. Electronically Signed   By: Lacy Duverney M.D.   On: 11/15/2016 10:59    Procedures Procedures (including critical care time)  Medications Ordered in ED Medications - No data to display   Initial Impression / Assessment and Plan / ED Course  I have reviewed the triage vital signs and the nursing notes.  Pertinent labs & imaging results that were available during my care of the patient were reviewed by me and considered in my medical decision making (see chart for details).      40yof presents with 7 day worsening of chronic foot pain. Pain worse with ambulation and weight-bearing. Pt afebrile, VS at pt's baseline. No instability, deformity, erythema, or warmth noted. Strength intact. Foot xray shows no significant change from prior xrays 1 month ago. There is some lucency surrounding hardware. Do not suspect gout, septic arthritis, osteomyelitis, or acute traumatic injury. Recommend follow up with ortho in 2-3 days for re-evaluation and discussion about further treatment options. Reviewed [place] substance database, pt most recently filled #21/1 suboxone 8-2mg  four days ago on 11/11/16. Discussed that I am not able to provide narcotic pain medication without violating pain contract. Provided pt with ace wrap, post-op boot, and crutches. Discussed use of heat/ice, compression, elevation, and NSAIDs for discomfort. Pt understand and is agreeable to this plan. Discussed ED return precautions.   Final Clinical Impressions(s) / ED Diagnoses   Final diagnoses:  Foot pain, right    New Prescriptions New Prescriptions   No medications on file   I personally performed the services described in this documentation, which was scribed in my presence. The recorded information has been reviewed and is accurate.    Jeanie Sewer, [place] 11/15/16 1128    Alvira Monday, MD 11/19/16 1836

## 2016-11-15 NOTE — Progress Notes (Signed)
Orthopedic Tech Progress Note Patient Details:  Judy Robles [birth year] [id number]  Ortho Devices Type of Ortho Device: Postop shoe/boot, Crutches Ortho Device/Splint Location: rle Ortho Device/Splint Interventions: Application   Ramanda Paules 11/15/2016, 11:38 AM

## 2016-11-15 NOTE — ED Notes (Signed)
Patient transported to X-ray 

## 2016-11-15 NOTE — ED Notes (Signed)
Ortho Tech Paged 

## 2016-11-15 NOTE — ED Triage Notes (Signed)
Pt states she had right foot surgery last year and has had a problem with the screw coming out. She reports she has had increased pain in her foot. She was recently placed in a cast to try to correct the problem. Pt states pain has continued to worsen and she needs to acquire a new orthopedic surgeon.

## 2016-11-20 ENCOUNTER — Encounter (HOSPITAL_COMMUNITY): Payer: Self-pay | Admitting: Emergency Medicine

## 2016-11-20 ENCOUNTER — Emergency Department (HOSPITAL_COMMUNITY)
Admission: EM | Admit: 2016-11-20 | Discharge: 2016-11-20 | Disposition: A | Discharge status: Home / Self Care | Payer: Medicaid Other | Attending: Emergency Medicine | Admitting: Emergency Medicine

## 2016-11-20 DIAGNOSIS — F17210 Nicotine dependence, cigarettes, uncomplicated: Secondary | ICD-10-CM | POA: Insufficient documentation

## 2016-11-20 DIAGNOSIS — F419 Anxiety disorder, unspecified: Secondary | ICD-10-CM | POA: Diagnosis present

## 2016-11-20 DIAGNOSIS — I10 Essential (primary) hypertension: Secondary | ICD-10-CM | POA: Diagnosis not present

## 2016-11-20 DIAGNOSIS — F1110 Opioid abuse, uncomplicated: Secondary | ICD-10-CM | POA: Diagnosis not present

## 2016-11-20 NOTE — ED Notes (Signed)
Pt awakens to verbal stimuli.  Pt stated "the place where I stay, called the ambulance."  Pt unable to state why she came; pt back to sleep.

## 2016-11-20 NOTE — ED Notes (Addendum)
Pt is rocking back and forth in the chair in the triage room, unable to sit still. 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 She said, "I'm here because I have no where else to go."

## 2016-11-20 NOTE — ED Triage Notes (Signed)
Pt presents from EMS for evaluation of anxiety for the last few day. Pt states she has been in detox treatment and is currently on Suboxne. Pt thinks anxiety may be from taking the suboxone. Pt restless at triage and has to be redirected when answering questions. Pt denies any SI/HI

## 2016-11-20 NOTE — ED Provider Notes (Signed)
[facility]-EMERGENCY DEPT Provider Note    By signing my name below, I, Earmon Phoenix, attest that this documentation has been prepared under the direction and in the presence of Tomasita Crumble, MD. Electronically Signed: Earmon Phoenix, ED Scribe. 11/20/16. 2:50 AM.    History   Chief Complaint Chief Complaint  Patient presents with  . Anxiety    The history is provided by the patient and medical records. No language interpreter was used.    Judy Robles is an obese 41 y.o. female with PMHx of polysubstance abuse, HTN, chronic renal insufficiency, MDD and chronic pain brought in by EMS who presents to the Emergency Department complaining of worsening anxiety over the past 2-3 days. She is soundly sleeping on exam. There are no modifying factors noted. She has no complaints despite being asked multiple times. She states she simply wants to sleep.   Past Medical History:  Diagnosis Date  . Arthritis   . Hypertension   . MRSA (methicillin resistant Staphylococcus aureus)   . Obesity   . Sleep apnea     Patient Active Problem List   Diagnosis Date Noted  . Chest pain 10/09/2016  . Pressure injury of skin 10/05/2016  . Community acquired pneumonia of left lung 10/03/2016  . Chronic renal insufficiency 07/22/2016  . Polysubstance dependence including opioid type drug with complication, episodic abuse (HCC) 07/21/2016  . MDD (major depressive disorder), recurrent severe, without psychosis (HCC) 07/21/2016  . Chronic pain 07/03/2015  . Diarrhea 05/01/2014  . Essential hypertension, benign 04/17/2014    Past Surgical History:  Procedure Laterality Date  . I&D EXTREMITY Right 04/16/2014   Procedure: IRRIGATION AND DEBRIDEMENT EXTREMITY;  Surgeon: Sharma Covert, MD;  Location: [facility] OR;  Service: Orthopedics;  Laterality: Right;    OB History    No data available       Home Medications    Prior to Admission medications   Medication Sig Start Date End Date Taking?  Authorizing Provider  Buprenorphine HCl-Naloxone HCl (SUBOXONE) 8-2 MG FILM Place 1 Film under the tongue 2 (two) times daily. 10/07/16   Tyson Alias, MD  PROAIR HFA 108 [phone] Base) MCG/ACT inhaler Inhale 2 puffs into the lungs every 6 (six) hours as needed for wheezing or shortness of breath.  09/30/16   Historical Provider, MD  senna-docusate (SENOKOT-S) 8.6-50 MG tablet Take 2 tablets by mouth 2 (two) times daily. 10/08/16   Nyra Market, MD  venlafaxine (EFFEXOR) 75 MG tablet Take 150 mg by mouth 2 (two) times daily.    Historical Provider, MD    Family History Family History  Problem Relation Age of Onset  . Throat cancer Mother   . Hypertension Father   . Stroke Father     Social History Social History  Substance Use Topics  . Smoking status: Current Every Day Smoker    Packs/day: 0.25    Types: Cigarettes  . Smokeless tobacco: Never Used     Comment: 1/2PPD  . Alcohol use No     Allergies   Tylenol [acetaminophen] and Lisinopril   Review of Systems Review of Systems 10 Systems reviewed and are negative for acute change except as noted in the HPI.    Physical Exam Updated Vital Signs BP (!) 150/111 (BP Location: Left Arm)   Pulse 95   Temp 98.2 F (36.8 C) (Oral)   Resp 18   Ht 5\' 3"  (1.6 m)   Wt 230 lb (104.3 kg)   SpO2 94%   BMI  40.74 kg/m   Physical Exam  Constitutional: She is oriented to person, place, and time. She appears well-developed and well-nourished. No distress.  HENT:  Head: Normocephalic and atraumatic.  Nose: Nose normal.  Mouth/Throat: Oropharynx is clear and moist. No oropharyngeal exudate.  Eyes: Conjunctivae and EOM are normal. Pupils are equal, round, and reactive to light. No scleral icterus.  Neck: Normal range of motion. Neck supple. No JVD present. No tracheal deviation present. No thyromegaly present.  Cardiovascular: Normal rate, regular rhythm and normal heart sounds.  Exam reveals no gallop and no friction rub.   No  murmur heard. Pulmonary/Chest: Effort normal and breath sounds normal. No respiratory distress. She has no wheezes. She exhibits no tenderness.  Abdominal: Soft. Bowel sounds are normal. She exhibits no distension and no mass. There is no tenderness. There is no rebound and no guarding.  Musculoskeletal: Normal range of motion. She exhibits no edema or tenderness.  Lymphadenopathy:    She has no cervical adenopathy.  Neurological: She is alert and oriented to person, place, and time. No cranial nerve deficit. She exhibits normal muscle tone.  Drowsy but able to arouse with verbal stimuli  Skin: Skin is warm and dry. No rash noted. No erythema. No pallor.  Nursing note and vitals reviewed.    ED Treatments / Results  DIAGNOSTIC STUDIES: Oxygen Saturation is 94% on RA, adequate by my interpretation.   COORDINATION OF CARE: 2:48 AM- Will discharge home.   Medications - No data to display  Labs (all labs ordered are listed, but only abnormal results are displayed) Labs Reviewed - No data to display  EKG  EKG Interpretation None       Radiology No results found.  Procedures Procedures (including critical care time)  Medications Ordered in ED Medications - No data to display   Initial Impression / Assessment and Plan / ED Course  I have reviewed the triage vital signs and the nursing notes.  Pertinent labs & imaging results that were available during my care of the patient were reviewed by me and considered in my medical decision making (see chart for details).    Patient presents to the ED for needing rest. She was sleeping on my exam.  When awoken she has no complaints and denies anxiety.  She appears well and in NAD.  VS are within her normal limits. Patient is safe for Dc.    I personally performed the services described in this documentation, which was scribed in my presence. The recorded information has been reviewed and is accurate.     Final Clinical  Impressions(s) / ED Diagnoses   Final diagnoses:  Opioid abuse    New Prescriptions New Prescriptions   No medications on file     Tomasita Crumble, MD 11/20/16 [phone]

## 2016-11-20 NOTE — ED Notes (Signed)
Called [employer], transferred to [employer], informed pt was picked up from [employer].  [phone].  This Clinical research associate dialed [phone], spoke with [place].  She stated "I told her to call me when she was done."  Informed Clovis Fredrickson, pt has been d/c'd.  Clovis Fredrickson stated "I'm on my way to pick her up.  She's staying @ [facility]".

## 2016-11-30 ENCOUNTER — Emergency Department (HOSPITAL_COMMUNITY): Payer: Medicaid Other

## 2016-11-30 ENCOUNTER — Emergency Department (HOSPITAL_COMMUNITY)
Admission: EM | Admit: 2016-11-30 | Discharge: 2016-11-30 | Disposition: A | Discharge status: Home / Self Care | Payer: Medicaid Other | Attending: Emergency Medicine | Admitting: Emergency Medicine

## 2016-11-30 DIAGNOSIS — N189 Chronic kidney disease, unspecified: Secondary | ICD-10-CM | POA: Diagnosis not present

## 2016-11-30 DIAGNOSIS — J188 Other pneumonia, unspecified organism: Secondary | ICD-10-CM | POA: Diagnosis not present

## 2016-11-30 DIAGNOSIS — F17210 Nicotine dependence, cigarettes, uncomplicated: Secondary | ICD-10-CM | POA: Diagnosis not present

## 2016-11-30 DIAGNOSIS — R0602 Shortness of breath: Secondary | ICD-10-CM | POA: Diagnosis present

## 2016-11-30 DIAGNOSIS — I129 Hypertensive chronic kidney disease with stage 1 through stage 4 chronic kidney disease, or unspecified chronic kidney disease: Secondary | ICD-10-CM | POA: Diagnosis not present

## 2016-11-30 DIAGNOSIS — J189 Pneumonia, unspecified organism: Secondary | ICD-10-CM

## 2016-11-30 LAB — CBC
HCT: 30.1 % — ABNORMAL LOW (ref 36.0–46.0)
Hemoglobin: 9.6 g/dL — ABNORMAL LOW (ref 12.0–15.0)
MCH: 29.6 pg (ref 26.0–34.0)
MCHC: 31.9 g/dL (ref 30.0–36.0)
MCV: 92.9 fL (ref 78.0–100.0)
Platelets: 196 10*3/uL (ref 150–400)
RBC: 3.24 MIL/uL — ABNORMAL LOW (ref 3.87–5.11)
RDW: 14.4 % (ref 11.5–15.5)
WBC: 6.6 10*3/uL (ref 4.0–10.5)

## 2016-11-30 LAB — BASIC METABOLIC PANEL
Anion gap: 11 (ref 5–15)
BUN: 10 mg/dL (ref 6–20)
CO2: 24 mmol/L (ref 22–32)
Calcium: 9.1 mg/dL (ref 8.9–10.3)
Chloride: 104 mmol/L (ref 101–111)
Creatinine, Ser: 0.87 mg/dL (ref 0.44–1.00)
GFR calc Af Amer: >60 mL/min (ref >60)
GFR calc non Af Amer: >60 mL/min (ref >60)
Glucose, Bld: 119 mg/dL — ABNORMAL HIGH (ref 65–99)
Potassium: 3.4 mmol/L — ABNORMAL LOW (ref 3.5–5.1)
Sodium: 139 mmol/L (ref 135–145)

## 2016-11-30 LAB — I-STAT TROPONIN, ED: Troponin i, poc: 0 ng/mL (ref 0.00–0.08)

## 2016-11-30 MED ORDER — IBUPROFEN 400 MG PO TABS
600.0000 mg | ORAL_TABLET | Freq: Once | ORAL | Status: AC
  Administered 2016-11-30: 600 mg via ORAL
  Filled 2016-11-30: qty 1

## 2016-11-30 MED ORDER — LEVOFLOXACIN 500 MG PO TABS
500.0000 mg | ORAL_TABLET | Freq: Every day | ORAL | 0 refills | Status: DC
Start: 2016-11-30 — End: 2017-03-07

## 2016-11-30 MED ORDER — AMOXICILLIN 500 MG PO CAPS: 1000.0000 mg | ORAL_CAPSULE | Freq: Once | ORAL | Status: DC

## 2016-11-30 MED ORDER — AZITHROMYCIN 250 MG PO TABS: 500.0000 mg | ORAL_TABLET | Freq: Once | ORAL | Status: DC

## 2016-11-30 MED ORDER — LEVOFLOXACIN 750 MG PO TABS
750.0000 mg | ORAL_TABLET | Freq: Once | ORAL | Status: AC
Start: 2016-11-30 — End: 2016-11-30
  Administered 2016-11-30: 750 mg via ORAL
  Filled 2016-11-30: qty 1

## 2016-11-30 NOTE — ED Notes (Signed)
Pt stable, understands discharge instructions, and reasons for return.   

## 2016-11-30 NOTE — ED Provider Notes (Signed)
[facility]-EMERGENCY DEPT Provider Note   CSN: [id number] Arrival date & time: 11/30/16  1154  By signing my name below, I, Judy Robles, attest that this documentation has been prepared under the direction and in the presence of [name], MD. Electronically Signed: Rosario Robles, ED Scribe. 11/30/16. 3:17 PM.  History   Chief Complaint Chief Complaint  Patient presents with  . Chest Pain  . Shortness of Breath   The history is provided by the patient and medical records. No language interpreter was used.    HPI Comments: Judy Robles is a 41 y.o. female with a PMHx of HTN, obesity, and polysubstance dependence, who presents to the Emergency Department complaining of persistent, waxing and waning shortness of breath beginning one week ago, worsening over the past three days. She reports associated chest pain, upper back pain, and persistent cough secondary to her shortness of breath. She additionally states that her anxiety has been acutely worse since her worsening shortness of breath. Her shortness of breath is worse with getting up and walking short distances. She has been taking anti-tussives at home with some temporary relief of her cough. Pt was also seen in the ED one month ago for same, and at that time she was dx'd w/ left lower lobe PNA. She completed a course of antibiotics following admission and discharge, however, she did not have f/u CXR. She denies fever, or any other associated symptoms.   Past Medical History:  Diagnosis Date  . Arthritis   . Hypertension   . MRSA (methicillin resistant Staphylococcus aureus)   . Obesity   . Sleep apnea    Patient Active Problem List   Diagnosis Date Noted  . Chest pain 10/09/2016  . Pressure injury of skin 10/05/2016  . Community acquired pneumonia of left lung 10/03/2016  . Chronic renal insufficiency 07/22/2016  . Polysubstance dependence including opioid type drug with complication, episodic abuse (HCC)  07/21/2016  . MDD (major depressive disorder), recurrent severe, without psychosis (HCC) 07/21/2016  . Chronic pain 07/03/2015  . Diarrhea 05/01/2014  . Essential hypertension, benign 04/17/2014   Past Surgical History:  Procedure Laterality Date  . I&D EXTREMITY Right 04/16/2014   Procedure: IRRIGATION AND DEBRIDEMENT EXTREMITY;  Surgeon: Sharma Covert, MD;  Location: [facility] OR;  Service: Orthopedics;  Laterality: Right;   OB History    No data available     Home Medications    Prior to Admission medications   Medication Sig Start Date End Date Taking? Authorizing Provider  ADDERALL XR 30 MG 24 hr capsule Take 30 mg by mouth daily. 11/14/16   Historical Provider, MD  ALPRAZolam Prudy Feeler) 1 MG tablet Take 1 mg by mouth 3 (three) times daily. 09/30/16   Historical Provider, MD  Buprenorphine HCl-Naloxone HCl (SUBOXONE) 8-2 MG FILM Place 1 Film under the tongue 2 (two) times daily. 10/07/16   Tyson Alias, MD  busPIRone (BUSPAR) 15 MG tablet Take 7.5 mg by mouth 2 (two) times daily. 11/24/16   Historical Provider, MD  gabapentin (NEURONTIN) 400 MG capsule Take 800 mg by mouth 4 (four) times daily. 11/24/16   Historical Provider, MD  ibuprofen (ADVIL,MOTRIN) 800 MG tablet Take 800 mg by mouth 3 (three) times daily as needed for pain. 11/11/16   Historical Provider, MD  PROAIR HFA 108 (90 Base) MCG/ACT inhaler Inhale 2 puffs into the lungs every 6 (six) hours as needed for wheezing or shortness of breath.  09/30/16   Historical Provider, MD  senna-docusate (  SENOKOT-S) 8.6-50 MG tablet Take 2 tablets by mouth 2 (two) times daily. 10/08/16   Nyra Market, MD  venlafaxine (EFFEXOR) 75 MG tablet Take 150 mg by mouth 2 (two) times daily.    Historical Provider, MD   Family History Family History  Problem Relation Age of Onset  . Throat cancer Mother   . Hypertension Father   . Stroke Father    Social History Social History  Substance Use Topics  . Smoking status: Current Every Day Smoker     Packs/day: 0.25    Types: Cigarettes  . Smokeless tobacco: Never Used     Comment: 1/2PPD  . Alcohol use No   Allergies   Tylenol [acetaminophen] and Lisinopril  Review of Systems Review of Systems  Constitutional: Negative for fever.  Respiratory: Positive for cough and shortness of breath.   Cardiovascular: Positive for chest pain.  Musculoskeletal: Positive for back pain.  Psychiatric/Behavioral: The patient is nervous/anxious.   All other systems reviewed and are negative.  Physical Exam Updated Vital Signs BP (!) 133/55 (BP Location: Left Arm)   Pulse 96   Temp 98.9 F (37.2 C) (Oral)   Resp 17   SpO2 96%   Physical Exam  Constitutional: She appears well-developed and well-nourished.  HENT:  Head: Normocephalic.  Right Ear: External ear normal.  Left Ear: External ear normal.  Nose: Nose normal.  Mouth/Throat: Oropharynx is clear and moist.  Eyes: Conjunctivae are normal. Right eye exhibits no discharge. Left eye exhibits no discharge.  Neck: Normal range of motion.  Cardiovascular: Normal rate, regular rhythm and normal heart sounds.   No murmur heard. Pulmonary/Chest: Effort normal. No respiratory distress. She has no wheezes. She has no rales.  Frequent coughing. Coarse breath sound bilaterally, left worse than right.   Abdominal: Soft. She exhibits no distension. There is no tenderness. There is no rebound and no guarding.  Musculoskeletal: Normal range of motion. She exhibits no edema or tenderness.  Neurological: She is alert. No cranial nerve deficit. Coordination normal.  Skin: Skin is warm and dry. No rash noted. No erythema. No pallor.  Psychiatric: She has a normal mood and affect. Her behavior is normal.  Nursing note and vitals reviewed.  ED Treatments / Results  DIAGNOSTIC STUDIES: Oxygen Saturation is 98% on RA, normal by my interpretation.   COORDINATION OF CARE: 3:08 PM-Discussed next steps with pt. Pt verbalized understanding and is  agreeable with the plan.   Labs (all labs ordered are listed, but only abnormal results are displayed) Labs Reviewed  BASIC METABOLIC PANEL - Abnormal; Notable for the following:       Result Value   Potassium 3.4 (*)    Glucose, Bld 119 (*)    All other components within normal limits  CBC - Abnormal; Notable for the following:    RBC 3.24 (*)    Hemoglobin 9.6 (*)    HCT 30.1 (*)    All other components within normal limits  I-STAT TROPOININ, ED   EKG  EKG Interpretation None      Radiology Dg Chest 2 View  Result Date: 11/30/2016 CLINICAL DATA:  Upper respiratory issues for 2 weeks, shortness of breath, dry cough, history COPD, smoking, hypertension, sleep apnea EXAM: CHEST  2 VIEW COMPARISON:  10/09/2016 FINDINGS: Normal heart size, mediastinal contours, and pulmonary vascularity. Persistent infiltrate in the LEFT lower lobe consistent with pneumonia. Minimal peribronchial thickening. RIGHT lung clear. No pleural effusion or pneumothorax. Bones demineralized. IMPRESSION: Persistent LEFT lower lobe infiltrate  consistent with pneumonia. In the setting of non resolving infiltrate, consider either inadequate treatment or atypical etiologies. Radiographic followup until resolution recommended. Electronically Signed   By: Ulyses Southward M.D.   On: 11/30/2016 13:01   Procedures Procedures   Medications Ordered in ED Medications - No data to display  Initial Impression / Assessment and Plan / ED Course  I have reviewed the triage vital signs and the nursing notes.  Pertinent labs & imaging results that were available during my care of the patient were reviewed by me and considered in my medical decision making (see chart for details).    40yF with cough. CXR with likey pneumonia. Afebrile. Tolerating PO. WOB not significantly increased and not hypoxic.   Final Clinical Impressions(s) / ED Diagnoses   Final diagnoses:  HCAP (healthcare-associated pneumonia)    New  Prescriptions New Prescriptions   No medications on file   I personally preformed the services scribed in my presence. The recorded information has been reviewed is accurate. [name], MD.    [name], MD 12/08/16 1000

## 2016-11-30 NOTE — ED Triage Notes (Signed)
Pt arrives via POv from home with chest pain and sob for the last couple weeks. Per patient pain and SOB has increased over the last several days. Denies recent fever. VSS.

## 2016-12-19 ENCOUNTER — Encounter (HOSPITAL_COMMUNITY): Payer: Self-pay

## 2016-12-19 ENCOUNTER — Emergency Department (HOSPITAL_COMMUNITY): Payer: Medicaid Other

## 2016-12-19 ENCOUNTER — Emergency Department (HOSPITAL_COMMUNITY)
Admission: EM | Admit: 2016-12-19 | Discharge: 2016-12-19 | Disposition: A | Discharge status: Home / Self Care | Payer: Medicaid Other | Attending: Emergency Medicine | Admitting: Emergency Medicine

## 2016-12-19 DIAGNOSIS — N189 Chronic kidney disease, unspecified: Secondary | ICD-10-CM | POA: Diagnosis not present

## 2016-12-19 DIAGNOSIS — R0789 Other chest pain: Secondary | ICD-10-CM | POA: Diagnosis present

## 2016-12-19 DIAGNOSIS — F419 Anxiety disorder, unspecified: Secondary | ICD-10-CM | POA: Insufficient documentation

## 2016-12-19 DIAGNOSIS — F17210 Nicotine dependence, cigarettes, uncomplicated: Secondary | ICD-10-CM | POA: Insufficient documentation

## 2016-12-19 DIAGNOSIS — I129 Hypertensive chronic kidney disease with stage 1 through stage 4 chronic kidney disease, or unspecified chronic kidney disease: Secondary | ICD-10-CM | POA: Insufficient documentation

## 2016-12-19 LAB — I-STAT TROPONIN, ED: TROPONIN I, POC: 0 ng/mL (ref 0.00–0.08)

## 2016-12-19 LAB — BASIC METABOLIC PANEL
Anion gap: 8 (ref 5–15)
BUN: 9 mg/dL (ref 6–20)
CALCIUM: 9.2 mg/dL (ref 8.9–10.3)
CO2: 27 mmol/L (ref 22–32)
CREATININE: 0.91 mg/dL (ref 0.44–1.00)
Chloride: 102 mmol/L (ref 101–111)
GFR calc Af Amer: >60 mL/min (ref >60)
GLUCOSE: 106 mg/dL — AB (ref 65–99)
Potassium: 4.1 mmol/L (ref 3.5–5.1)
Sodium: 137 mmol/L (ref 135–145)

## 2016-12-19 LAB — CBC
HCT: 31.1 % — ABNORMAL LOW (ref 36.0–46.0)
Hemoglobin: 10.3 g/dL — ABNORMAL LOW (ref 12.0–15.0)
MCH: 30.2 pg (ref 26.0–34.0)
MCHC: 33.1 g/dL (ref 30.0–36.0)
MCV: 91.2 fL (ref 78.0–100.0)
Platelets: 187 10*3/uL (ref 150–400)
RBC: 3.41 MIL/uL — ABNORMAL LOW (ref 3.87–5.11)
RDW: 14.1 % (ref 11.5–15.5)
WBC: 4.2 10*3/uL (ref 4.0–10.5)

## 2016-12-19 MED ORDER — IPRATROPIUM BROMIDE HFA 17 MCG/ACT IN AERS: 2.0000 | INHALATION_SPRAY | Freq: Four times a day (QID) | RESPIRATORY_TRACT | 12 refills | Status: DC | PRN

## 2016-12-19 MED ORDER — ALPRAZOLAM 0.25 MG PO TABS
1.0000 mg | ORAL_TABLET | Freq: Once | ORAL | Status: AC
  Administered 2016-12-19: 1 mg via ORAL
  Filled 2016-12-19: qty 4

## 2016-12-19 MED ORDER — PROAIR HFA 108 (90 BASE) MCG/ACT IN AERS: 2.0000 | INHALATION_SPRAY | Freq: Four times a day (QID) | RESPIRATORY_TRACT | 12 refills | Status: DC | PRN

## 2016-12-19 MED ORDER — OXYCODONE-ACETAMINOPHEN 5-325 MG PO TABS
1.0000 | ORAL_TABLET | Freq: Once | ORAL | Status: AC
  Administered 2016-12-19: 1 via ORAL
  Filled 2016-12-19: qty 1

## 2016-12-19 NOTE — ED Triage Notes (Signed)
Per Pt, Pt reports "not feeling well" starting yesterday. Pt complains that she had some bilateral leg swelling noted and then last night started to get some SOB and Chest pain. Pt reports feeling anxious.

## 2016-12-19 NOTE — ED Provider Notes (Signed)
[facility]PT Provider Note   CSN: [id number] Arrival date & time: 12/19/16  1301     History   Chief Complaint Chief Complaint  Patient presents with  . Chest Pain    HPI Judy Robles is a 41 y.o. female.  The history is provided by the patient and medical records.  Chest Pain   This is a recurrent problem. The current episode started 2 days ago. The problem occurs constantly. The problem has not changed since onset.The pain is associated with rest. Pain location: left anterior chest. The pain is moderate. The quality of the pain is described as pressure-like. The pain does not radiate. Associated symptoms include cough and shortness of breath. Pertinent negatives include no abdominal pain, no back pain, no fever, no palpitations and no vomiting. She has tried nothing for the symptoms. Risk factors include obesity and smoking/tobacco exposure.  Her past medical history is significant for anxiety/panic attacks and hypertension.  Pertinent negatives for past medical history include no CAD, no diabetes and no seizures.    Past Medical History:  Diagnosis Date  . Arthritis   . Hypertension   . MRSA (methicillin resistant Staphylococcus aureus)   . Obesity   . Sleep apnea     Patient Active Problem List   Diagnosis Date Noted  . Chest pain 10/09/2016  . Pressure injury of skin 10/05/2016  . Community acquired pneumonia of left lung 10/03/2016  . Chronic renal insufficiency 07/22/2016  . Polysubstance dependence including opioid type drug with complication, episodic abuse (HCC) 07/21/2016  . MDD (major depressive disorder), recurrent severe, without psychosis (HCC) 07/21/2016  . Chronic pain 07/03/2015  . Diarrhea 05/01/2014  . Essential hypertension, benign 04/17/2014    Past Surgical History:  Procedure Laterality Date  . I&D EXTREMITY Right 04/16/2014   Procedure: IRRIGATION AND DEBRIDEMENT EXTREMITY;  Surgeon: Sharma Covert, MD;  Location: [facility] OR;  Service:  Orthopedics;  Laterality: Right;    OB History    No data available       Home Medications    Prior to Admission medications   Medication Sig Start Date End Date Taking? Authorizing Provider  ADDERALL XR 30 MG 24 hr capsule Take 30 mg by mouth daily. 11/14/16   Historical Provider, MD  ALPRAZolam Prudy Feeler) 1 MG tablet Take 1 mg by mouth 3 (three) times daily. 09/30/16   Historical Provider, MD  Buprenorphine HCl-Naloxone HCl (SUBOXONE) 8-2 MG FILM Place 1 Film under the tongue 2 (two) times daily. 10/07/16   Tyson Alias, MD  busPIRone (BUSPAR) 15 MG tablet Take 7.5 mg by mouth 2 (two) times daily. 11/24/16   Historical Provider, MD  gabapentin (NEURONTIN) 400 MG capsule Take 800 mg by mouth 4 (four) times daily. 11/24/16   Historical Provider, MD  ibuprofen (ADVIL,MOTRIN) 800 MG tablet Take 800 mg by mouth 3 (three) times daily as needed for pain. 11/11/16   Historical Provider, MD  levofloxacin (LEVAQUIN) 500 MG tablet Take 1 tablet (500 mg total) by mouth daily. 11/30/16   [name], MD  PROAIR HFA 108 [phone] Base) MCG/ACT inhaler Inhale 2 puffs into the lungs every 6 (six) hours as needed for wheezing or shortness of breath.  09/30/16   Historical Provider, MD  senna-docusate (SENOKOT-S) 8.6-50 MG tablet Take 2 tablets by mouth 2 (two) times daily. 10/08/16   Nyra Market, MD  venlafaxine (EFFEXOR) 75 MG tablet Take 150 mg by mouth 2 (two) times daily.    Historical Provider, MD  Family History Family History  Problem Relation Age of Onset  . Throat cancer Mother   . Hypertension Father   . Stroke Father     Social History Social History  Substance Use Topics  . Smoking status: Current Every Day Smoker    Packs/day: 0.25    Types: Cigarettes  . Smokeless tobacco: Never Used     Comment: 1/2PPD  . Alcohol use No     Allergies   Tylenol [acetaminophen] and Lisinopril   Review of Systems Review of Systems  Constitutional: Negative for chills and fever.  HENT:  Negative for ear pain and sore throat.   Eyes: Negative for pain and visual disturbance.  Respiratory: Positive for cough and shortness of breath.   Cardiovascular: Positive for chest pain. Negative for palpitations.  Gastrointestinal: Negative for abdominal pain and vomiting.  Genitourinary: Negative for dysuria and hematuria.  Musculoskeletal: Negative for arthralgias and back pain.  Skin: Negative for color change and rash.  Neurological: Negative for seizures and syncope.  All other systems reviewed and are negative.    Physical Exam Updated Vital Signs BP 97/80 (BP Location: Right Arm)   Pulse 84   Temp 97.8 F (36.6 C) (Oral)   Resp 18   Ht 5\' 3"  (1.6 m)   Wt 104.3 kg   SpO2 100%   BMI 40.74 kg/m   Physical Exam  Constitutional: She appears well-developed and well-nourished. No distress.  HENT:  Head: Normocephalic and atraumatic.  Eyes: Conjunctivae are normal.  Neck: Neck supple.  Cardiovascular: Normal rate and regular rhythm.   No murmur heard. Pulmonary/Chest: Effort normal and breath sounds normal. No respiratory distress.  Abdominal: Soft. There is no tenderness.  Musculoskeletal: She exhibits no edema.  Neurological: She is alert.  Skin: Skin is warm and dry.  Psychiatric: She has a normal mood and affect.  Nursing note and vitals reviewed.    ED Treatments / Results  Labs (all labs ordered are listed, but only abnormal results are displayed) Labs Reviewed  CBC - Abnormal; Notable for the following:       Result Value   RBC 3.41 (*)    Hemoglobin 10.3 (*)    HCT 31.1 (*)    All other components within normal limits  BASIC METABOLIC PANEL  I-STAT TROPOININ, ED    EKG  EKG Interpretation None       Radiology Dg Chest 2 View  Result Date: 12/19/2016 CLINICAL DATA:  Shortness of breath, weakness, bilateral lower extremity edema. Previous episode of pneumonia 2-3 weeks ago. History of COPD current smoker. EXAM: CHEST  2 VIEW COMPARISON:   Chest x-ray of November 30, 2016 FINDINGS: There has been interval clearing of the infiltrate in the left lower lobe. Minimal if any residual density is present here. There is no pleural effusion. The heart and pulmonary vascularity are normal. The trachea is midline. There is calcification in the wall of the aortic arch. The bony thorax exhibits no acute abnormality. IMPRESSION: No CHF. Interval near total clearing of infiltrate in the left lower lobe. An additional follow-up chest x-ray in 2-3 weeks is recommended to assure complete clearing. Electronically Signed   By: David  December 02, 2016 M.D.   On: 12/19/2016 13:46    Procedures Procedures (including critical care time)  Medications Ordered in ED Medications - No data to display   Initial Impression / Assessment and Plan / ED Course  I have reviewed the triage vital signs and the nursing notes.  Pertinent labs &  imaging results that were available during my care of the patient were reviewed by me and considered in my medical decision making (see chart for details).    Pt with h/o polysubstance abuse, HTN, chronic renal insufficiency, MDD and chronic pain presents with CP. She says she started feeling "bad" two days ago & is concerned that she has PNA again; she says she's had it twice this year already. She is also complaining of "anxiety" and says she's going to "jump out of my skin." Endorses cough, subjective fevers, chest "tightness", & SOB; denies HA, lightheadedness, diaphoresis, N/V/D, urinary symptoms. She just moved to [facility] & is also requesting referrals for a PCP & pain doctor so she can get her medications refilled.  VS & exam as above. EKG: NSR @ 82bpm w/o signs of ischemia. CXR with interval improvement of LLL infiltrate. Labs unremarkable, including undetectable troponin.   Discussed results with the Pt at beside; using shared decision-making, the Pt opted to leave before her 2nd troponin. Symptoms likely related to resolving PNA  and anxiety.  Explained all results to the Pt. Will discharge the Pt home with prescriptions for albuterol & ipratropium inhalers. Recommending calling the number in her d/c paperwork to establish primary care & f/u w/a pain clinic in [place]. ED return precautions provided. Pt acknowledged understanding of, and concurrence with the plan. All questions answered to her satisfaction. In stable condition at the time of discharge.  Final Clinical Impressions(s) / ED Diagnoses   Final diagnoses:  Anxiety    New Prescriptions New Prescriptions   No medications on file     Forest Becker, MD 12/19/16 1503    Canary Brim Tegeler, MD 12/23/16 1558

## 2016-12-27 ENCOUNTER — Emergency Department (HOSPITAL_COMMUNITY)
Admission: EM | Admit: 2016-12-27 | Discharge: 2016-12-27 | Disposition: A | Discharge status: Home / Self Care | Payer: No Typology Code available for payment source | Attending: Emergency Medicine | Admitting: Emergency Medicine

## 2016-12-27 ENCOUNTER — Encounter (HOSPITAL_COMMUNITY): Payer: Self-pay | Admitting: Emergency Medicine

## 2016-12-27 ENCOUNTER — Emergency Department (HOSPITAL_COMMUNITY): Payer: No Typology Code available for payment source

## 2016-12-27 DIAGNOSIS — Y999 Unspecified external cause status: Secondary | ICD-10-CM | POA: Diagnosis not present

## 2016-12-27 DIAGNOSIS — S7002XA Contusion of left hip, initial encounter: Secondary | ICD-10-CM

## 2016-12-27 DIAGNOSIS — Y9389 Activity, other specified: Secondary | ICD-10-CM | POA: Diagnosis not present

## 2016-12-27 DIAGNOSIS — Y92410 Unspecified street and highway as the place of occurrence of the external cause: Secondary | ICD-10-CM | POA: Insufficient documentation

## 2016-12-27 DIAGNOSIS — S79912A Unspecified injury of left hip, initial encounter: Secondary | ICD-10-CM | POA: Diagnosis present

## 2016-12-27 DIAGNOSIS — S7012XA Contusion of left thigh, initial encounter: Secondary | ICD-10-CM | POA: Insufficient documentation

## 2016-12-27 LAB — I-STAT BETA HCG BLOOD, ED (MC, WL, AP ONLY)

## 2016-12-27 MED ORDER — OXYCODONE HCL 5 MG PO TABS
5.0000 mg | ORAL_TABLET | Freq: Once | ORAL | Status: AC
  Administered 2016-12-27: 5 mg via ORAL
  Filled 2016-12-27: qty 1

## 2016-12-27 MED ORDER — FLUCONAZOLE 100 MG PO TABS
150.0000 mg | ORAL_TABLET | Freq: Once | ORAL | Status: AC
  Administered 2016-12-27: 150 mg via ORAL
  Filled 2016-12-27: qty 2
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
## 2016-12-27 NOTE — ED Notes (Signed)
Papers reviewed and patient verbalizes understanding   

## 2016-12-27 NOTE — Discharge Instructions (Signed)
Continue use of motrin every 8 hours for 4-5 days. Ice leg as well.
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
## 2017-01-29 ENCOUNTER — Emergency Department (HOSPITAL_COMMUNITY)
Admission: EM | Admit: 2017-01-29 | Discharge: 2017-01-29 | Disposition: A | Discharge status: Home / Self Care | Payer: Medicaid Other | Attending: Emergency Medicine | Admitting: Emergency Medicine

## 2017-01-29 ENCOUNTER — Encounter (HOSPITAL_COMMUNITY): Payer: Self-pay | Admitting: *Deleted

## 2017-01-29 DIAGNOSIS — R21 Rash and other nonspecific skin eruption: Secondary | ICD-10-CM

## 2017-01-29 DIAGNOSIS — F17210 Nicotine dependence, cigarettes, uncomplicated: Secondary | ICD-10-CM | POA: Insufficient documentation

## 2017-01-29 DIAGNOSIS — L03119 Cellulitis of unspecified part of limb: Secondary | ICD-10-CM | POA: Diagnosis not present

## 2017-01-29 DIAGNOSIS — Z79899 Other long term (current) drug therapy: Secondary | ICD-10-CM | POA: Insufficient documentation

## 2017-01-29 DIAGNOSIS — I10 Essential (primary) hypertension: Secondary | ICD-10-CM | POA: Insufficient documentation

## 2017-01-29 LAB — CBC
HCT: 37.8 % (ref 36.0–46.0)
Hemoglobin: 12.2 g/dL (ref 12.0–15.0)
MCH: 29.7 pg (ref 26.0–34.0)
MCHC: 32.3 g/dL (ref 30.0–36.0)
MCV: 92 fL (ref 78.0–100.0)
PLATELETS: 209 10*3/uL (ref 150–400)
RBC: 4.11 MIL/uL (ref 3.87–5.11)
RDW: 13.7 % (ref 11.5–15.5)
WBC: 8.2 10*3/uL (ref 4.0–10.5)

## 2017-01-29 LAB — COMPREHENSIVE METABOLIC PANEL
ALK PHOS: 68 U/L (ref 38–126)
ALT: 20 U/L (ref 14–54)
AST: 34 U/L (ref 15–41)
Albumin: 3.4 g/dL — ABNORMAL LOW (ref 3.5–5.0)
Anion gap: 10 (ref 5–15)
BILIRUBIN TOTAL: 0.4 mg/dL (ref 0.3–1.2)
BUN: 11 mg/dL (ref 6–20)
CHLORIDE: 104 mmol/L (ref 101–111)
CO2: 23 mmol/L (ref 22–32)
CREATININE: 0.97 mg/dL (ref 0.44–1.00)
Calcium: 9.4 mg/dL (ref 8.9–10.3)
GFR calc Af Amer: >60 mL/min (ref >60)
Glucose, Bld: 104 mg/dL — ABNORMAL HIGH (ref 65–99)
Potassium: 3.7 mmol/L (ref 3.5–5.1)
Sodium: 137 mmol/L (ref 135–145)
TOTAL PROTEIN: 7.6 g/dL (ref 6.5–8.1)

## 2017-01-29 MED ORDER — TRAMADOL HCL 50 MG PO TABS
50.0000 mg | ORAL_TABLET | Freq: Once | ORAL | Status: AC
  Administered 2017-01-29: 50 mg via ORAL
  Filled 2017-01-29: qty 1

## 2017-01-29 MED ORDER — ETODOLAC 300 MG PO CAPS: 300.0000 mg | ORAL_CAPSULE | Freq: Three times a day (TID) | ORAL | 0 refills | Status: DC

## 2017-01-29 MED ORDER — CEPHALEXIN 500 MG PO CAPS: 500.0000 mg | ORAL_CAPSULE | Freq: Four times a day (QID) | ORAL | 0 refills | Status: DC

## 2017-01-29 MED ORDER — CEPHALEXIN 250 MG PO CAPS
500.0000 mg | ORAL_CAPSULE | Freq: Once | ORAL | Status: AC
  Administered 2017-01-29: 500 mg via ORAL
  Filled 2017-01-29: qty 2

## 2017-01-29 NOTE — ED Provider Notes (Signed)
[facility]PT Provider Note   CSN: [id number] Arrival date & time: 01/29/17  1610  By signing my name below, I, [name], attest that this documentation has been prepared under the direction and in the presence of Linwood Dibbles, MD. Electronically Signed: [name], ED Scribe. 01/29/17. 5:23 PM.  History   Chief Complaint Chief Complaint  Patient presents with  . Rash   The history is provided by the patient. No language interpreter was used.   HPI Comments: Judy Robles is a 41 y.o. female w/ a h/o MRSA, who presents to the Emergency Department complaining of persistent, worsening bilateral foot pain w/ associated swelling and areas of redness beginning this morning. She notes associated subjective fever. She denies the areas as being pruritic. No new soaps, lotions, detergents, foods, animals, plants, or medications. No trauma or injury over the area. Her pain is worse with ambulation and palpation over the area. No noted treatments for her symptoms were tried prior to coming into the ED. No h/o similar. She denies cough, chest pain, shortness of breath, or any other associated symptoms.   Past Medical History:  Diagnosis Date  . Arthritis   . Hypertension   . MRSA (methicillin resistant Staphylococcus aureus)   . Obesity   . Sleep apnea    Patient Active Problem List   Diagnosis Date Noted  . Chest pain 10/09/2016  . Pressure injury of skin 10/05/2016  . Community acquired pneumonia of left lung 10/03/2016  . Chronic renal insufficiency 07/22/2016  . Polysubstance dependence including opioid type drug with complication, episodic abuse (HCC) 07/21/2016  . MDD (major depressive disorder), recurrent severe, without psychosis (HCC) 07/21/2016  . Chronic pain 07/03/2015  . Diarrhea 05/01/2014  . Essential hypertension, benign 04/17/2014   Past Surgical History:  Procedure Laterality Date  . I&D EXTREMITY Right 04/16/2014   Procedure: IRRIGATION AND  DEBRIDEMENT EXTREMITY;  Surgeon: Sharma Covert, MD;  Location: [facility] OR;  Service: Orthopedics;  Laterality: Right;   OB History    No data available     Home Medications    Prior to Admission medications   Medication Sig Start Date End Date Taking? Authorizing Provider  ADDERALL XR 30 MG 24 hr capsule Take 30 mg by mouth daily. 11/14/16   [provider]  ALPRAZolam Prudy Feeler) 1 MG tablet Take 1 mg by mouth 3 (three) times daily. 09/30/16   [provider]  Buprenorphine HCl-Naloxone HCl (SUBOXONE) 8-2 MG FILM Place 1 Film under the tongue 2 (two) times daily. Patient not taking: Reported on 12/27/2016 10/07/16   Tyson Alias, MD  busPIRone (BUSPAR) 15 MG tablet Take 7.5 mg by mouth 2 (two) times daily. 11/24/16   [provider]  cephALEXin (KEFLEX) 500 MG capsule Take 1 capsule (500 mg total) by mouth 4 (four) times daily. 01/29/17   Linwood Dibbles, MD  etodolac (LODINE) 300 MG capsule Take 1 capsule (300 mg total) by mouth every 8 (eight) hours. 01/29/17   Linwood Dibbles, MD  gabapentin (NEURONTIN) 400 MG capsule Take 800 mg by mouth 4 (four) times daily. 11/24/16   [provider]  ibuprofen (ADVIL,MOTRIN) 800 MG tablet Take 800 mg by mouth 3 (three) times daily as needed for pain. 11/11/16   [provider]  ipratropium (ATROVENT HFA) 17 MCG/ACT inhaler Inhale 2 puffs into the lungs every 6 (six) hours as needed for wheezing. 12/19/16   Forest Becker, MD  levofloxacin (LEVAQUIN) 500 MG tablet Take 1 tablet (500  mg total) by mouth daily. Patient not taking: Reported on 12/27/2016 11/30/16   [name], MD  [facility] HFA 108 [phone] Base) MCG/ACT inhaler Inhale 2 puffs into the lungs every 6 (six) hours as needed for wheezing or shortness of breath. 12/19/16   Forest Becker, MD  senna-docusate (SENOKOT-S) 8.6-50 MG tablet Take 2 tablets by mouth 2 (two) times daily. 10/08/16   Nyra Market, MD  venlafaxine (EFFEXOR) 75 MG tablet Take 150 mg by mouth 2 (two)  times daily.    [provider]  venlafaxine XR (EFFEXOR-XR) 150 MG 24 hr capsule Take 150 mg by mouth daily. 12/16/16   [provider]   Family History Family History  Problem Relation Age of Onset  . Throat cancer Mother   . Hypertension Father   . Stroke Father    Social History Social History  Substance Use Topics  . Smoking status: Current Every Day Smoker    Packs/day: 0.25    Types: Cigarettes  . Smokeless tobacco: Never Used     Comment: 1/2PPD  . Alcohol use No   Allergies   Tylenol [acetaminophen] and Lisinopril  Review of Systems Review of Systems  Respiratory: Negative for cough and shortness of breath.   Cardiovascular: Positive for leg swelling. Negative for chest pain.  Skin: Positive for color change and rash.  All other systems reviewed and are negative.  Physical Exam Updated Vital Signs BP (!) 162/97 (BP Location: Left Arm)   Pulse (!) 123   Temp 98.5 F (36.9 C) (Oral)   Resp (!) 22   Ht 1.6 m (5\' 3" )   Wt 106.6 kg (235 lb)   SpO2 96%   BMI 41.63 kg/m   Physical Exam  Constitutional: She appears well-developed and well-nourished. No distress.  HENT:  Head: Normocephalic and atraumatic.  Right Ear: External ear normal.  Left Ear: External ear normal.  Eyes: Conjunctivae are normal. Right eye exhibits no discharge. Left eye exhibits no discharge. No scleral icterus.  Neck: Neck supple. No tracheal deviation present.  Cardiovascular: Normal rate, regular rhythm and intact distal pulses.   Pulmonary/Chest: Effort normal and breath sounds normal. No stridor. No respiratory distress. She has no wheezes. She has no rales.  Abdominal: Soft. Bowel sounds are normal. She exhibits no distension. There is no tenderness. There is no rebound and no guarding.  Musculoskeletal: She exhibits edema and tenderness.  Erythematous rash bilateral lower extremities, warm to the touch. Brisk cap refill. No cyanosis.   Neurological: She is alert.  She has normal strength. No sensory deficit. Cranial nerve deficit: no gross deficits. She exhibits normal muscle tone. She displays no seizure activity. Coordination normal.  Skin: Skin is warm and dry. No rash noted.  Psychiatric: She has a normal mood and affect.  Nursing note and vitals reviewed.  ED Treatments / Results  DIAGNOSTIC STUDIES: Oxygen Saturation is 96% on RA, normal by my interpretation.   COORDINATION OF CARE: 5:17 PM-Discussed next steps with pt. Pt verbalized understanding and is agreeable with the plan.   Labs (all labs ordered are listed, but only abnormal results are displayed) Labs Reviewed  COMPREHENSIVE METABOLIC PANEL - Abnormal; Notable for the following:       Result Value   Glucose, Bld 104 (*)    Albumin 3.4 (*)    All other components within normal limits  CBC    Procedures Procedures   Medications Ordered in ED Medications  cephALEXin (KEFLEX) capsule 500 mg (not administered)  traMADol (  ULTRAM) tablet 50 mg (not administered)    Initial Impression / Assessment and Plan / ED Course  I have reviewed the triage vital signs and the nursing notes.  Pertinent labs & imaging results that were available during my care of the patient were reviewed by me and considered in my medical decision making (see chart for details).  Clinical Course as of Jan 30 1731  Sun Jan 29, 2017  1728 [place] database.  Regular suboxone prescriptions.  Last was 5/29 and 6/6 there was a t3 presciption  [JK]    Clinical Course User Index [JK] Linwood Dibbles, MD    Labs are reassuring.  Bilateral erythema noted on lower extremities.   No history to suggest sunburn.  Suspect cellulitis.  Will dc home with antibiotics, nsaids.   Follow up with PCP  Final Clinical Impressions(s) / ED Diagnoses   Final diagnoses:  Rash  Cellulitis of lower extremity, unspecified laterality   New Prescriptions New Prescriptions   CEPHALEXIN (KEFLEX) 500 MG CAPSULE    Take 1 capsule (500 mg  total) by mouth 4 (four) times daily.   ETODOLAC (LODINE) 300 MG CAPSULE    Take 1 capsule (300 mg total) by mouth every 8 (eight) hours.   I personally performed the services described in this documentation, which was scribed in my presence.  The recorded information has been reviewed and is accurate.     Linwood Dibbles, MD 01/29/17 [phone]

## 2017-01-29 NOTE — Discharge Instructions (Signed)
Take the medications as prescribed, follow-up with your doctor next week to be rechecked, monitor for fever, worsening symptoms

## 2017-01-29 NOTE — ED Triage Notes (Signed)
The pt is c/o a rash on both her legs from the knees down  It just started this am.   No itching just pain.  She has a circular lesion to the lt foot medially and a similar lesioin to the rt lateral knee  She denies being outside long enough to be bitten by something.  No lesions anywhere else  No sob.  No previous history  lmp irregular

## 2017-01-31 ENCOUNTER — Emergency Department (HOSPITAL_COMMUNITY): Payer: Medicaid Other

## 2017-01-31 ENCOUNTER — Encounter (HOSPITAL_COMMUNITY): Payer: Self-pay | Admitting: Emergency Medicine

## 2017-01-31 ENCOUNTER — Emergency Department (HOSPITAL_COMMUNITY)
Admission: EM | Admit: 2017-01-31 | Discharge: 2017-01-31 | Disposition: A | Discharge status: Home / Self Care | Payer: Medicaid Other | Attending: Emergency Medicine | Admitting: Emergency Medicine

## 2017-01-31 DIAGNOSIS — I872 Venous insufficiency (chronic) (peripheral): Secondary | ICD-10-CM | POA: Diagnosis not present

## 2017-01-31 DIAGNOSIS — L03116 Cellulitis of left lower limb: Secondary | ICD-10-CM | POA: Insufficient documentation

## 2017-01-31 DIAGNOSIS — L03115 Cellulitis of right lower limb: Secondary | ICD-10-CM | POA: Diagnosis not present

## 2017-01-31 DIAGNOSIS — R0602 Shortness of breath: Secondary | ICD-10-CM | POA: Diagnosis not present

## 2017-01-31 DIAGNOSIS — F17210 Nicotine dependence, cigarettes, uncomplicated: Secondary | ICD-10-CM | POA: Insufficient documentation

## 2017-01-31 DIAGNOSIS — I10 Essential (primary) hypertension: Secondary | ICD-10-CM | POA: Diagnosis not present

## 2017-01-31 DIAGNOSIS — M7989 Other specified soft tissue disorders: Secondary | ICD-10-CM | POA: Diagnosis present

## 2017-01-31 DIAGNOSIS — Z79899 Other long term (current) drug therapy: Secondary | ICD-10-CM | POA: Insufficient documentation

## 2017-01-31 DIAGNOSIS — L03119 Cellulitis of unspecified part of limb: Secondary | ICD-10-CM

## 2017-01-31 LAB — CBC
HEMATOCRIT: 36.4 % (ref 36.0–46.0)
HEMOGLOBIN: 12 g/dL (ref 12.0–15.0)
MCH: 30.3 pg (ref 26.0–34.0)
MCHC: 33 g/dL (ref 30.0–36.0)
MCV: 91.9 fL (ref 78.0–100.0)
Platelets: 190 10*3/uL (ref 150–400)
RBC: 3.96 MIL/uL (ref 3.87–5.11)
RDW: 13.6 % (ref 11.5–15.5)
WBC: 6.2 10*3/uL (ref 4.0–10.5)

## 2017-01-31 LAB — BRAIN NATRIURETIC PEPTIDE: B Natriuretic Peptide: 22.6 pg/mL (ref 0.0–100.0)

## 2017-01-31 LAB — BASIC METABOLIC PANEL
ANION GAP: 9 (ref 5–15)
BUN: 8 mg/dL (ref 6–20)
CO2: 28 mmol/L (ref 22–32)
Calcium: 9.1 mg/dL (ref 8.9–10.3)
Chloride: 100 mmol/L — ABNORMAL LOW (ref 101–111)
Creatinine, Ser: 1.19 mg/dL — ABNORMAL HIGH (ref 0.44–1.00)
GFR calc Af Amer: >60 mL/min (ref >60)
GFR, EST NON AFRICAN AMERICAN: 55 mL/min — AB (ref >60)
GLUCOSE: 106 mg/dL — AB (ref 65–99)
POTASSIUM: 3.6 mmol/L (ref 3.5–5.1)
SODIUM: 137 mmol/L (ref 135–145)

## 2017-01-31 MED ORDER — SULFAMETHOXAZOLE-TRIMETHOPRIM 800-160 MG PO TABS
1.0000 | ORAL_TABLET | Freq: Once | ORAL | Status: AC
  Administered 2017-01-31: 1 via ORAL
  Filled 2017-01-31: qty 1

## 2017-01-31 MED ORDER — SULFAMETHOXAZOLE-TRIMETHOPRIM 800-160 MG PO TABS: 1.0000 | ORAL_TABLET | Freq: Two times a day (BID) | ORAL | 0 refills | Status: AC

## 2017-01-31 MED ORDER — FUROSEMIDE 20 MG PO TABS: 20.0000 mg | ORAL_TABLET | Freq: Every day | ORAL | 0 refills | Status: DC

## 2017-01-31 MED ORDER — FUROSEMIDE 20 MG PO TABS
40.0000 mg | ORAL_TABLET | Freq: Once | ORAL | Status: AC
  Administered 2017-01-31: 40 mg via ORAL
  Filled 2017-01-31: qty 2

## 2017-01-31 MED ORDER — IBUPROFEN 400 MG PO TABS
600.0000 mg | ORAL_TABLET | Freq: Once | ORAL | Status: AC
  Administered 2017-01-31: 600 mg via ORAL
  Filled 2017-01-31: qty 1

## 2017-01-31 NOTE — Discharge Instructions (Signed)
Continue taking Keflex as prescribed, also start Bactrim as prescribed.  You May take 2 doses of Lasix to help with swelling.  See your family doctor by the end of the week for recheck.

## 2017-01-31 NOTE — ED Triage Notes (Signed)
Pt having chest pain, midsternal, states is under "a great deal of stress" also c/o swelling in bilateral legs--  Pt is living in transitional housing, has talked with a nurse practitioner who told her that her labs were abnormal from 2 days ago.

## 2017-01-31 NOTE — ED Notes (Signed)
Pt upset about not receiving a stronger pain medication and states she has ibuprofen at home. Pt informed we aren't able to give her anything stronger. Pt requests to speak with Dr. Donnald Garre, MD informed.

## 2017-01-31 NOTE — ED Notes (Signed)
Patient transported to X-ray 

## 2017-01-31 NOTE — ED Notes (Signed)
ED Provider at bedside. 

## 2017-01-31 NOTE — ED Provider Notes (Signed)
[facility]-EM[facility]Y DEPT Provider Note   CSN: [id number] Arrival date & time: 01/31/17  1228     History   Chief Complaint Chief Complaint  Patient presents with  . Chest Pain  . Shortness of Breath    HPI Judy Robles is a 41 y.o. female.  HPI Patient reports ongoing swelling of her legs. This is been a persistent problem. She reports are also somewhat red. She reports this is very painful for her and she needs a stronger pain medication than ibuprofen. No fever no chills. She reports she is also got pressure in her chest. This been coming and going. She does report she has a lot of stress right now and is working in trying to get a better living situation. Sometimes she feels short of breath. No cough or sputum production. Past Medical History:  Diagnosis Date  . Arthritis   . Hypertension   . MRSA (methicillin resistant Staphylococcus aureus)   . Obesity   . Sleep apnea     Patient Active Problem List   Diagnosis Date Noted  . Chest pain 10/09/2016  . Pressure injury of skin 10/05/2016  . Community acquired pneumonia of left lung 10/03/2016  . Chronic renal insufficiency 07/22/2016  . Polysubstance dependence including opioid type drug with complication, episodic abuse (HCC) 07/21/2016  . MDD (major depressive disorder), recurrent severe, without psychosis (HCC) 07/21/2016  . Chronic pain 07/03/2015  . Diarrhea 05/01/2014  . Essential hypertension, benign 04/17/2014    Past Surgical History:  Procedure Laterality Date  . I&D EXTREMITY Right 04/16/2014   Procedure: IRRIGATION AND DEBRIDEMENT EXTREMITY;  Surgeon: Sharma Covert, MD;  Location: [facility] OR;  Service: Orthopedics;  Laterality: Right;    OB History    No data available       Home Medications    Prior to Admission medications   Medication Sig Start Date End Date Taking? Authorizing Provider  ADDERALL XR 30 MG 24 hr capsule Take 30 mg by mouth daily. 11/14/16  Yes [provider]  ALPRAZolam  Prudy Feeler) 1 MG tablet Take 1 mg by mouth 3 (three) times daily. 09/30/16  Yes [provider]  Buprenorphine HCl-Naloxone HCl (SUBOXONE) 8-2 MG FILM Place 1 Film under the tongue 2 (two) times daily. 10/07/16  Yes Tyson Alias, MD  busPIRone (BUSPAR) 15 MG tablet Take 7.5 mg by mouth 2 (two) times daily. 11/24/16  Yes [provider]  cephALEXin (KEFLEX) 500 MG capsule Take 1 capsule (500 mg total) by mouth 4 (four) times daily. 01/29/17  Yes Linwood Dibbles, MD  etodolac (LODINE) 300 MG capsule Take 1 capsule (300 mg total) by mouth every 8 (eight) hours. 01/29/17  Yes Linwood Dibbles, MD  gabapentin (NEURONTIN) 400 MG capsule Take 800 mg by mouth 4 (four) times daily. 11/24/16  Yes [provider]  ibuprofen (ADVIL,MOTRIN) 800 MG tablet Take 800 mg by mouth 3 (three) times daily as needed for pain. 11/11/16  Yes [provider]  ipratropium (ATROVENT HFA) 17 MCG/ACT inhaler Inhale 2 puffs into the lungs every 6 (six) hours as needed for wheezing. 12/19/16  Yes Petit, Janyth Pupa, MD  senna-docusate (SENOKOT-S) 8.6-50 MG tablet Take 2 tablets by mouth 2 (two) times daily. 10/08/16  Yes Nyra Market, MD  venlafaxine (EFFEXOR) 75 MG tablet Take 150 mg by mouth 2 (two) times daily.   Yes [provider]  venlafaxine XR (EFFEXOR-XR) 150 MG 24 hr capsule Take 150 mg by mouth daily. 12/16/16  Yes [provider]  levofloxacin (LEVAQUIN) 500 MG tablet Take 1 tablet (500 mg total) by mouth daily. Patient not taking: Reported on 12/27/2016 11/30/16   [name], MD  [facility] HFA [phone] Base) MCG/ACT inhaler Inhale 2 puffs into the lungs every 6 (six) hours as needed for wheezing or shortness of breath. 12/19/16   Forest Becker, MD    Family History Family History  Problem Relation Age of Onset  . Throat cancer Mother   . Hypertension Father   . Stroke Father     Social History Social History  Substance Use Topics  . Smoking status: Current Every Day Smoker      Packs/day: 0.25    Types: Cigarettes  . Smokeless tobacco: Never Used     Comment: 1/2PPD  . Alcohol use No     Allergies   Tylenol [acetaminophen] and Lisinopril   Review of Systems Review of Systems  10 Systems reviewed and are negative for acute change except as noted in the HPI.  Physical Exam Updated Vital Signs BP (!) 142/75 (BP Location: Right Arm)   Pulse 88   Temp 99.5 F (37.5 C) (Oral)   Resp 18   Ht 5\' 3"  (1.6 m)   Wt 106.6 kg (235 lb)   SpO2 98%   BMI 41.63 kg/m   Physical Exam  Constitutional: She is oriented to person, place, and time. She appears well-developed and well-nourished. No distress.  Patient is nontoxic and alert. Clinically well and appearance. No respiratory distress. Morbid obesity.  HENT:  Head: Normocephalic and atraumatic.  Eyes: Conjunctivae and EOM are normal.  Neck: Neck supple.  Cardiovascular: Normal rate and regular rhythm.   No murmur heard. Pulmonary/Chest: Effort normal and breath sounds normal. No respiratory distress.  Abdominal: Soft. There is no tenderness.  Musculoskeletal: She exhibits edema.  Bilateral lower extremity edema 1+ and symmetric. A chronic appearance. Skin condition is warm and dry and intact. She does have several more discrete areas of erythema around the lower leg predominantly medially and on the anterior surface. This is blanching. No streaking. No erythema above the lower one third of the leg. Foot has no erythema. No wounds.  Neurological: She is alert and oriented to person, place, and time. No cranial nerve deficit. She exhibits normal muscle tone. Coordination normal.  Skin: Skin is warm and dry.  Psychiatric: She has a normal mood and affect.  Nursing note and vitals reviewed.    ED Treatments / Results  Labs (all labs ordered are listed, but only abnormal results are displayed) Labs Reviewed  BASIC METABOLIC PANEL - Abnormal; Notable for the following:       Result Value   Chloride 100  (*)    Glucose, Bld 106 (*)    Creatinine, Ser 1.19 (*)    GFR calc non Af Amer 55 (*)    All other components within normal limits  CBC  BRAIN NATRIURETIC PEPTIDE  I-STAT TROPOININ, ED    EKG  EKG Interpretation  Date/Time:  Tuesday January 31 2017 12:34:13 EDT Ventricular Rate:  105 PR Interval:  126 QRS Duration: 80 QT Interval:  340 QTC Calculation: 449 R Axis:   64 Text Interpretation:  Sinus tachycardia Otherwise normal ECG Confirmed by 07-09-2000 [phone]) on 01/31/2017 2:08:07 PM       Radiology Dg Chest 2 View  Result Date: 01/31/2017 CLINICAL DATA:  Midsternal chest pain EXAM: CHEST  2 VIEW COMPARISON:  12/19/2016 FINDINGS: Normal heart size and mediastinal contours. No acute infiltrate or  edema. No effusion or pneumothorax. No acute osseous findings. IMPRESSION: No evidence of active disease. Electronically Signed   By: Marnee Spring M.D.   On: 01/31/2017 13:29    Procedures Procedures (including critical care time)  Medications Ordered in ED Medications  sulfamethoxazole-trimethoprim (BACTRIM DS,SEPTRA DS) 800-160 MG per tablet 1 tablet (1 tablet Oral Given 01/31/17 1449)  furosemide (LASIX) tablet 40 mg (40 mg Oral Given 01/31/17 1449)  ibuprofen (ADVIL,MOTRIN) tablet 600 mg (600 mg Oral Given 01/31/17 1449)     Initial Impression / Assessment and Plan / ED Course  I have reviewed the triage vital signs and the nursing notes.  Pertinent labs & imaging results that were available during my care of the patient were reviewed by me and considered in my medical decision making (see chart for details).     Final Clinical Impressions(s) / ED Diagnoses   Final diagnoses:  Chronic venous stasis dermatitis  Cellulitis of lower extremity, unspecified laterality  Patient does have findings consistent with chronic venous stasis. There is some erythema which may be an exacerbation of cellulitis. There also may be an element of chronic venous dermatitis. Patient  however does not have any open wounds or ulcerations. She was prescribed Keflex reports she is not seeing significant improvement. Bactrim will be added. Patient reports she needs pain medication stronger than ibuprofen. At this time however the patient does have history of polysubstance abuse and treatment with Suboxone. She is advised that narcotics are not indicated at this time. Patient is counseled follow-up with her primary provider and is provided with resources.  New Prescriptions New Prescriptions   No medications on file     Arby Barrette, MD 01/31/17 [phone]

## 2017-03-07 ENCOUNTER — Emergency Department (HOSPITAL_COMMUNITY): Payer: Medicaid Other

## 2017-03-07 ENCOUNTER — Other Ambulatory Visit: Payer: Self-pay

## 2017-03-07 ENCOUNTER — Encounter (HOSPITAL_COMMUNITY): Payer: Self-pay

## 2017-03-07 ENCOUNTER — Emergency Department (HOSPITAL_COMMUNITY)
Admission: EM | Admit: 2017-03-07 | Discharge: 2017-03-07 | Disposition: A | Discharge status: Home / Self Care | Payer: Medicaid Other | Attending: Emergency Medicine | Admitting: Emergency Medicine

## 2017-03-07 DIAGNOSIS — N3000 Acute cystitis without hematuria: Secondary | ICD-10-CM

## 2017-03-07 DIAGNOSIS — J189 Pneumonia, unspecified organism: Secondary | ICD-10-CM

## 2017-03-07 DIAGNOSIS — N189 Chronic kidney disease, unspecified: Secondary | ICD-10-CM | POA: Insufficient documentation

## 2017-03-07 DIAGNOSIS — R2243 Localized swelling, mass and lump, lower limb, bilateral: Secondary | ICD-10-CM | POA: Insufficient documentation

## 2017-03-07 DIAGNOSIS — F17210 Nicotine dependence, cigarettes, uncomplicated: Secondary | ICD-10-CM | POA: Insufficient documentation

## 2017-03-07 DIAGNOSIS — I129 Hypertensive chronic kidney disease with stage 1 through stage 4 chronic kidney disease, or unspecified chronic kidney disease: Secondary | ICD-10-CM | POA: Insufficient documentation

## 2017-03-07 DIAGNOSIS — R0602 Shortness of breath: Secondary | ICD-10-CM | POA: Diagnosis present

## 2017-03-07 DIAGNOSIS — Z79899 Other long term (current) drug therapy: Secondary | ICD-10-CM | POA: Insufficient documentation

## 2017-03-07 DIAGNOSIS — R509 Fever, unspecified: Secondary | ICD-10-CM | POA: Diagnosis not present

## 2017-03-07 DIAGNOSIS — J181 Lobar pneumonia, unspecified organism: Secondary | ICD-10-CM | POA: Diagnosis not present

## 2017-03-07 DIAGNOSIS — A599 Trichomoniasis, unspecified: Secondary | ICD-10-CM | POA: Diagnosis not present

## 2017-03-07 LAB — BASIC METABOLIC PANEL
Anion gap: 6 (ref 5–15)
BUN: 9 mg/dL (ref 6–20)
CHLORIDE: 102 mmol/L (ref 101–111)
CO2: 27 mmol/L (ref 22–32)
Calcium: 9.1 mg/dL (ref 8.9–10.3)
Creatinine, Ser: 0.95 mg/dL (ref 0.44–1.00)
GFR calc non Af Amer: >60 mL/min (ref >60)
Glucose, Bld: 98 mg/dL (ref 65–99)
POTASSIUM: 4.4 mmol/L (ref 3.5–5.1)
SODIUM: 135 mmol/L (ref 135–145)

## 2017-03-07 LAB — URINALYSIS, ROUTINE W REFLEX MICROSCOPIC
Bilirubin Urine: NEGATIVE
GLUCOSE, UA: NEGATIVE mg/dL
HGB URINE DIPSTICK: NEGATIVE
KETONES UR: NEGATIVE mg/dL
NITRITE: NEGATIVE
PROTEIN: NEGATIVE mg/dL
Specific Gravity, Urine: 1.011 (ref 1.005–1.030)
pH: 6 (ref 5.0–8.0)

## 2017-03-07 LAB — CBC
HCT: 35.6 % — ABNORMAL LOW (ref 36.0–46.0)
Hemoglobin: 11.6 g/dL — ABNORMAL LOW (ref 12.0–15.0)
MCH: 29.5 pg (ref 26.0–34.0)
MCHC: 32.6 g/dL (ref 30.0–36.0)
MCV: 90.6 fL (ref 78.0–100.0)
Platelets: 193 10*3/uL (ref 150–400)
RBC: 3.93 MIL/uL (ref 3.87–5.11)
RDW: 13.4 % (ref 11.5–15.5)
WBC: 11.2 10*3/uL — AB (ref 4.0–10.5)

## 2017-03-07 LAB — HEPATIC FUNCTION PANEL
ALT: 16 U/L (ref 14–54)
AST: 23 U/L (ref 15–41)
Albumin: 2.8 g/dL — ABNORMAL LOW (ref 3.5–5.0)
Alkaline Phosphatase: 53 U/L (ref 38–126)
BILIRUBIN DIRECT: 0.1 mg/dL (ref 0.1–0.5)
BILIRUBIN INDIRECT: 0.5 mg/dL (ref 0.3–0.9)
TOTAL PROTEIN: 6.3 g/dL — AB (ref 6.5–8.1)
Total Bilirubin: 0.6 mg/dL (ref 0.3–1.2)

## 2017-03-07 LAB — I-STAT BETA HCG BLOOD, ED (MC, WL, AP ONLY)

## 2017-03-07 LAB — RAPID URINE DRUG SCREEN, HOSP PERFORMED
AMPHETAMINES: NOT DETECTED
BENZODIAZEPINES: NOT DETECTED
Barbiturates: NOT DETECTED
COCAINE: POSITIVE — AB
Opiates: NOT DETECTED
Tetrahydrocannabinol: NOT DETECTED

## 2017-03-07 LAB — I-STAT TROPONIN, ED
Troponin i, poc: 0.01 ng/mL (ref 0.00–0.08)
Troponin i, poc: 0.01 ng/mL (ref 0.00–0.08)

## 2017-03-07 LAB — I-STAT CG4 LACTIC ACID, ED: Lactic Acid, Venous: 1.32 mmol/L (ref 0.5–1.9)

## 2017-03-07 MED ORDER — CEPHALEXIN 500 MG PO CAPS: 500.0000 mg | ORAL_CAPSULE | Freq: Three times a day (TID) | ORAL | 0 refills | Status: DC

## 2017-03-07 MED ORDER — AZITHROMYCIN 250 MG PO TABS: 1000.0000 mg | ORAL_TABLET | Freq: Once | ORAL | Status: DC

## 2017-03-07 MED ORDER — BENZONATATE 100 MG PO CAPS: 100.0000 mg | ORAL_CAPSULE | Freq: Two times a day (BID) | ORAL | 0 refills | Status: DC | PRN

## 2017-03-07 MED ORDER — ALBUTEROL SULFATE HFA 108 (90 BASE) MCG/ACT IN AERS
2.0000 | INHALATION_SPRAY | Freq: Once | RESPIRATORY_TRACT | Status: AC
Start: 2017-03-07 — End: 2017-03-07
  Administered 2017-03-07: 2 via RESPIRATORY_TRACT
  Filled 2017-03-07: qty 6.7

## 2017-03-07 MED ORDER — AZITHROMYCIN 250 MG PO TABS: 250.0000 mg | ORAL_TABLET | Freq: Every day | ORAL | 0 refills | Status: DC

## 2017-03-07 MED ORDER — AZITHROMYCIN 250 MG PO TABS
500.0000 mg | ORAL_TABLET | Freq: Once | ORAL | Status: AC
  Administered 2017-03-07: 500 mg via ORAL
  Filled 2017-03-07: qty 2

## 2017-03-07 MED ORDER — CYCLOBENZAPRINE HCL 10 MG PO TABS: 10.0000 mg | ORAL_TABLET | Freq: Three times a day (TID) | ORAL | 0 refills | Status: DC | PRN

## 2017-03-07 MED ORDER — PHENAZOPYRIDINE HCL 200 MG PO TABS: 200.0000 mg | ORAL_TABLET | Freq: Three times a day (TID) | ORAL | 0 refills | Status: DC

## 2017-03-07 MED ORDER — IPRATROPIUM-ALBUTEROL 0.5-2.5 (3) MG/3ML IN SOLN
3.0000 mL | Freq: Once | RESPIRATORY_TRACT | Status: AC
  Administered 2017-03-07: 3 mL via RESPIRATORY_TRACT
  Filled 2017-03-07: qty 3

## 2017-03-07 MED ORDER — SODIUM CHLORIDE 0.9 % IV BOLUS (SEPSIS)
1000.0000 mL | Freq: Once | INTRAVENOUS | Status: AC
  Administered 2017-03-07: 1000 mL via INTRAVENOUS

## 2017-03-07 MED ORDER — HYDROCODONE-HOMATROPINE 5-1.5 MG/5ML PO SYRP
5.0000 mL | ORAL_SOLUTION | ORAL | Status: AC
  Administered 2017-03-07: 5 mL via ORAL
  Filled 2017-03-07: qty 5

## 2017-03-07 MED ORDER — KETOROLAC TROMETHAMINE 30 MG/ML IJ SOLN
30.0000 mg | Freq: Once | INTRAMUSCULAR | Status: AC
  Administered 2017-03-07: 30 mg via INTRAVENOUS
  Filled 2017-03-07: qty 1

## 2017-03-07 MED ORDER — METRONIDAZOLE 500 MG PO TABS
2000.0000 mg | ORAL_TABLET | ORAL | Status: AC
  Administered 2017-03-07: 2000 mg via ORAL
  Filled 2017-03-07: qty 4

## 2017-03-07 MED ORDER — OXYCODONE HCL 5 MG PO TABS
5.0000 mg | ORAL_TABLET | ORAL | Status: AC
  Administered 2017-03-07: 5 mg via ORAL
  Filled 2017-03-07: qty 1

## 2017-03-07 MED ORDER — DEXTROSE 5 % IV SOLN
1.0000 g | Freq: Once | INTRAVENOUS | Status: AC
  Administered 2017-03-07: 1 g via INTRAVENOUS
  Filled 2017-03-07: qty 10

## 2017-03-07 NOTE — ED Notes (Signed)
Pt called out stating her pain was still a 9/10 all over. PA aware. Orders pending

## 2017-03-07 NOTE — ED Notes (Signed)
Pt ambulatory to restroom

## 2017-03-07 NOTE — ED Notes (Signed)
Pt unable to provide sample at this time. Pt falling asleep while RN in room administering pain medication

## 2017-03-07 NOTE — Discharge Instructions (Signed)
Read the information below.  Use the prescribed medication as directed.  Please discuss all new medications with your pharmacist.  You may return to the Emergency Department at any time for worsening condition or any new symptoms that concern you.     If you develop high fevers, worsening abdominal pain, uncontrolled vomiting, or are unable to tolerate fluids by mouth, return to the ER for a recheck.    If you develop high fevers that do not resolve with tylenol or ibuprofen, you have difficulty swallowing or breathing, or you are unable to tolerate fluids by mouth, return to the ER for a recheck.

## 2017-03-07 NOTE — ED Notes (Signed)
Pt aware we need urine sample.  

## 2017-03-07 NOTE — ED Triage Notes (Signed)
Pt endorses left sided chest pain/back pain x several day with shob and foot swelling. Pt able to speak in complete sentences. Denies n/v or dizziness.

## 2017-03-07 NOTE — ED Provider Notes (Signed)
[facility] Provider Note   CSN: [id number] Arrival date & time: 03/07/17  1059     History   Chief Complaint Chief Complaint  Patient presents with  . Chest Pain  . Shortness of Breath    HPI Judy Robles is a 41 y.o. female.  HPI   Pt with hx HTN, obesity, sleep apnea p/w 4 days of fatigue, diffuse body aches, subjective fevers, cough productive of green/yellow sputum, SOB, dull constant left sided chest pain.  Has lower extremity edema.  Also is having dysuria and malodorous urine.   Pt states she hasn't had suboxone in a few days but also denies taking it regularly.  Does not feel she is in withdrawal.  Past Medical History:  Diagnosis Date  . Arthritis   . Hypertension   . MRSA (methicillin resistant Staphylococcus aureus)   . Obesity   . Sleep apnea     Patient Active Problem List   Diagnosis Date Noted  . Chest pain 10/09/2016  . Pressure injury of skin 10/05/2016  . Community acquired pneumonia of left lung 10/03/2016  . Chronic renal insufficiency 07/22/2016  . Polysubstance dependence including opioid type drug with complication, episodic abuse (HCC) 07/21/2016  . MDD (major depressive disorder), recurrent severe, without psychosis (HCC) 07/21/2016  . Chronic pain 07/03/2015  . Diarrhea 05/01/2014  . Essential hypertension, benign 04/17/2014    Past Surgical History:  Procedure Laterality Date  . I&D EXTREMITY Right 04/16/2014   Procedure: IRRIGATION AND DEBRIDEMENT EXTREMITY;  Surgeon: Sharma Covert, MD;  Location: [facility] OR;  Service: Orthopedics;  Laterality: Right;    OB History    No data available       Home Medications    Prior to Admission medications   Medication Sig Start Date End Date Taking? Authorizing Provider  ADDERALL XR 30 MG 24 hr capsule Take 30 mg by mouth daily. 11/14/16  Yes [provider]  ALPRAZolam Prudy Feeler) 1 MG tablet Take 1 mg by mouth 3 (three) times daily. 09/30/16  Yes [provider]    Buprenorphine HCl-Naloxone HCl (SUBOXONE) 8-2 MG FILM Place 1 Film under the tongue 2 (two) times daily. 10/07/16  Yes Tyson Alias, MD  busPIRone (BUSPAR) 15 MG tablet Take 7.5 mg by mouth 2 (two) times daily. 11/24/16  Yes [provider]  etodolac (LODINE) 300 MG capsule Take 1 capsule (300 mg total) by mouth every 8 (eight) hours. 01/29/17  Yes Linwood Dibbles, MD  gabapentin (NEURONTIN) 400 MG capsule Take 800 mg by mouth 4 (four) times daily. 11/24/16  Yes [provider]  ibuprofen (ADVIL,MOTRIN) 800 MG tablet Take 800 mg by mouth 3 (three) times daily as needed for pain. 11/11/16  Yes [provider]  ipratropium (ATROVENT HFA) 17 MCG/ACT inhaler Inhale 2 puffs into the lungs every 6 (six) hours as needed for wheezing. 12/19/16  Yes Forest Becker, MD  [facility] HFA 108 [phone] Base) MCG/ACT inhaler Inhale 2 puffs into the lungs every 6 (six) hours as needed for wheezing or shortness of breath. 12/19/16  Yes Petit, Janyth Pupa, MD  senna-docusate (SENOKOT-S) 8.6-50 MG tablet Take 2 tablets by mouth 2 (two) times daily. 10/08/16  Yes Nyra Market, MD  azithromycin (ZITHROMAX) 250 MG tablet Take 1 tablet (250 mg total) by mouth daily. 03/07/17   Trixie Dredge, PA-C  benzonatate (TESSALON) 100 MG capsule Take 1 capsule (100 mg total) by mouth 2 (two) times daily as needed for cough. 03/07/17   Trixie Dredge, PA-C  cephALEXin (KEFLEX) 500 MG capsule Take 1 capsule (500 mg total) by mouth 3 (three) times daily. 03/07/17   Trixie Dredge, PA-C  cyclobenzaprine (FLEXERIL) 10 MG tablet Take 1 tablet (10 mg total) by mouth 3 (three) times daily as needed for muscle spasms (or pain). 03/07/17   Trixie Dredge, PA-C  furosemide (LASIX) 20 MG tablet Take 1 tablet (20 mg total) by mouth daily. Patient not taking: Reported on 03/07/2017 01/31/17   Arby Barrette, MD  phenazopyridine (PYRIDIUM) 200 MG tablet Take 1 tablet (200 mg total) by mouth 3 (three) times daily. 03/07/17   Trixie Dredge, PA-C     Family History Family History  Problem Relation Age of Onset  . Throat cancer Mother   . Hypertension Father   . Stroke Father     Social History Social History  Substance Use Topics  . Smoking status: Current Every Day Smoker    Packs/day: 1.00    Types: Cigarettes  . Smokeless tobacco: Never Used     Comment: 1/2PPD  . Alcohol use No     Allergies   Tylenol [acetaminophen] and Lisinopril   Review of Systems Review of Systems  All other systems reviewed and are negative.    Physical Exam Updated Vital Signs BP 105/63   Pulse 67   Temp 98.6 F (37 C) (Oral)   Resp 15   Ht 5\' 3"  (1.6 m)   Wt 104.3 kg (230 lb)   SpO2 100%   BMI 40.74 kg/m   Physical Exam  Constitutional: She appears well-developed and well-nourished. No distress.  HENT:  Head: Normocephalic and atraumatic.  Neck: Neck supple.  Cardiovascular: Normal rate and regular rhythm.   Pulmonary/Chest: Effort normal and breath sounds normal. No respiratory distress. She has no wheezes. She has no rales.  Abdominal: Soft. She exhibits no distension. There is no tenderness. There is no rebound, no guarding and no CVA tenderness.  Musculoskeletal: She exhibits edema (bilateral foot edema ).  Neurological: She is alert.  Skin: She is not diaphoretic.  Nursing note and vitals reviewed.    ED Treatments / Results  Labs (all labs ordered are listed, but only abnormal results are displayed) Labs Reviewed  CBC - Abnormal; Notable for the following:       Result Value   WBC 11.2 (*)    Hemoglobin 11.6 (*)    HCT 35.6 (*)    All other components within normal limits  URINALYSIS, ROUTINE W REFLEX MICROSCOPIC - Abnormal; Notable for the following:    APPearance HAZY (*)    Leukocytes, UA LARGE (*)    Bacteria, UA FEW (*)    Squamous Epithelial / LPF 0-5 (*)    All other components within normal limits  HEPATIC FUNCTION PANEL - Abnormal; Notable for the following:    Total Protein 6.3 (*)     Albumin 2.8 (*)    All other components within normal limits  RAPID URINE DRUG SCREEN, HOSP PERFORMED - Abnormal; Notable for the following:    Cocaine POSITIVE (*)    All other components within normal limits  URINE CULTURE  BASIC METABOLIC PANEL  I-STAT TROPOININ, ED  I-STAT BETA HCG BLOOD, ED ([facility], [facility], [facility] ONLY)  I-STAT CG4 LACTIC ACID, ED  Rosezena Sensor, ED    EKG  EKG Interpretation None       Radiology Dg Chest 2 View  Result Date: 03/07/2017 CLINICAL DATA:  Cough.  Chest pain. EXAM: CHEST  2 VIEW COMPARISON:  01/31/2017 .  FINDINGS: Mediastinum and hilar structures normal. Mild left base subsegmental atelectasis. Mild infiltrate cannot be excluded . Heart size normal. No pleural effusion pneumothorax. Degenerative changes thoracic spine. IMPRESSION: Mild left base subsegmental atelectasis. Mild infiltrate cannot be excluded. Electronically Signed   By: Maisie Fus  Register   On: 03/07/2017 11:36    Procedures Procedures (including critical care time)  Medications Ordered in ED Medications  ketorolac (TORADOL) 30 MG/ML injection 30 mg (30 mg Intravenous Given 03/07/17 1520)  sodium chloride 0.9 % bolus 1,000 mL (0 mLs Intravenous Stopped 03/07/17 2000)  azithromycin (ZITHROMAX) tablet 500 mg (500 mg Oral Given 03/07/17 1657)  ipratropium-albuterol (DUONEB) 0.5-2.5 (3) MG/3ML nebulizer solution 3 mL (3 mLs Nebulization Given 03/07/17 1707)  cefTRIAXone (ROCEPHIN) 1 g in dextrose 5 % 50 mL IVPB (0 g Intravenous Stopped 03/07/17 1921)  oxyCODONE (Oxy IR/ROXICODONE) immediate release tablet 5 mg (5 mg Oral Given 03/07/17 1849)  metroNIDAZOLE (FLAGYL) tablet 2,000 mg (2,000 mg Oral Given 03/07/17 1928)  albuterol (PROVENTIL HFA;VENTOLIN HFA) 108 (90 Base) MCG/ACT inhaler 2 puff (2 puffs Inhalation Given 03/07/17 2004)  HYDROcodone-homatropine (HYCODAN) 5-1.5 MG/5ML syrup 5 mL (5 mLs Oral Given 03/07/17 2003)     Initial Impression / Assessment and Plan / ED Course  I have reviewed the  triage vital signs and the nursing notes.  Pertinent labs & imaging results that were available during my care of the patient were reviewed by me and considered in my medical decision making (see chart for details).  Clinical Course as of Mar 07 2006  Tue Mar 07, 2017  1934 Pt advised of trichomonas in her urine.  States she has not been sexually active for "a long time."  Will treat.   Pt also advised of cocaine positive.  She denies any cocaine use at all.  Denies all drug use.    [EW]  1952 Second troponin 0.01.  Negative.    [EW]    Clinical Course User Index [EW] Trixie Dredge, [place]    Afebrile patient with 4 days of fatigue, body aches, respiratory and urinary symptoms. No fever in department.  Labs remarkable only for mild leukocytosis.  UA appears infected.  Clinically doubt pyelonephritis.  CXR demonstrates possible pneumonia.  Pt given IVF, symptomatic medications, azithromycin, rocephin, food/drinks.  Pt d/c back to transitional housing with note to allow her to rest.  There is no hypoxia.  No CVA or abdominal tenderness, no active vomiting.   Discussed result, findings, treatment, and follow up  with patient.  Pt given return precautions.  Pt verbalizes understanding and agrees with plan.      Final Clinical Impressions(s) / ED Diagnoses   Final diagnoses:  Community acquired pneumonia of left lower lobe of lung (HCC)  Acute cystitis without hematuria  Trichomonas infection    New Prescriptions New Prescriptions   AZITHROMYCIN (ZITHROMAX) 250 MG TABLET    Take 1 tablet (250 mg total) by mouth daily.   BENZONATATE (TESSALON) 100 MG CAPSULE    Take 1 capsule (100 mg total) by mouth 2 (two) times daily as needed for cough.   CEPHALEXIN (KEFLEX) 500 MG CAPSULE    Take 1 capsule (500 mg total) by mouth 3 (three) times daily.   CYCLOBENZAPRINE (FLEXERIL) 10 MG TABLET    Take 1 tablet (10 mg total) by mouth 3 (three) times daily as needed for muscle spasms (or pain).    PHENAZOPYRIDINE (PYRIDIUM) 200 MG TABLET    Take 1 tablet (200 mg total) by mouth 3 (three)  times daily.     Trixie Dredge, Cordelia Poche 03/07/17 2009    Alvira Monday, MD 03/08/17 [phone]

## 2017-03-07 NOTE — ED Notes (Signed)
Pt provided with another [place] sandwich

## 2017-03-07 NOTE — ED Notes (Signed)
Pt provided with Turkey sandwich.

## 2017-03-09 LAB — URINE CULTURE

## 2017-04-22 ENCOUNTER — Observation Stay (HOSPITAL_COMMUNITY): Payer: Medicaid Other

## 2017-04-22 ENCOUNTER — Encounter (HOSPITAL_COMMUNITY): Payer: Self-pay | Admitting: Emergency Medicine

## 2017-04-22 ENCOUNTER — Inpatient Hospital Stay (HOSPITAL_COMMUNITY)
Admission: EM | Admit: 2017-04-22 | Discharge: 2017-04-24 | DRG: 917 | Disposition: A | Discharge status: Home / Self Care | Payer: Medicaid Other | Attending: Internal Medicine | Admitting: Internal Medicine

## 2017-04-22 DIAGNOSIS — Z792 Long term (current) use of antibiotics: Secondary | ICD-10-CM

## 2017-04-22 DIAGNOSIS — F17210 Nicotine dependence, cigarettes, uncomplicated: Secondary | ICD-10-CM | POA: Diagnosis present

## 2017-04-22 DIAGNOSIS — Z791 Long term (current) use of non-steroidal anti-inflammatories (NSAID): Secondary | ICD-10-CM

## 2017-04-22 DIAGNOSIS — E669 Obesity, unspecified: Secondary | ICD-10-CM | POA: Diagnosis present

## 2017-04-22 DIAGNOSIS — R0989 Other specified symptoms and signs involving the circulatory and respiratory systems: Secondary | ICD-10-CM

## 2017-04-22 DIAGNOSIS — R0902 Hypoxemia: Secondary | ICD-10-CM | POA: Diagnosis present

## 2017-04-22 DIAGNOSIS — G9341 Metabolic encephalopathy: Secondary | ICD-10-CM | POA: Diagnosis present

## 2017-04-22 DIAGNOSIS — N179 Acute kidney failure, unspecified: Secondary | ICD-10-CM | POA: Diagnosis present

## 2017-04-22 DIAGNOSIS — T424X4A Poisoning by benzodiazepines, undetermined, initial encounter: Principal | ICD-10-CM | POA: Diagnosis present

## 2017-04-22 DIAGNOSIS — G8929 Other chronic pain: Secondary | ICD-10-CM | POA: Diagnosis present

## 2017-04-22 DIAGNOSIS — Z823 Family history of stroke: Secondary | ICD-10-CM

## 2017-04-22 DIAGNOSIS — F332 Major depressive disorder, recurrent severe without psychotic features: Secondary | ICD-10-CM | POA: Diagnosis present

## 2017-04-22 DIAGNOSIS — F609 Personality disorder, unspecified: Secondary | ICD-10-CM | POA: Diagnosis present

## 2017-04-22 DIAGNOSIS — T50901A Poisoning by unspecified drugs, medicaments and biological substances, accidental (unintentional), initial encounter: Secondary | ICD-10-CM | POA: Diagnosis present

## 2017-04-22 DIAGNOSIS — Z8614 Personal history of Methicillin resistant Staphylococcus aureus infection: Secondary | ICD-10-CM

## 2017-04-22 DIAGNOSIS — Z886 Allergy status to analgesic agent status: Secondary | ICD-10-CM

## 2017-04-22 DIAGNOSIS — Z888 Allergy status to other drugs, medicaments and biological substances status: Secondary | ICD-10-CM

## 2017-04-22 DIAGNOSIS — Z6841 Body Mass Index (BMI) 40.0 and over, adult: Secondary | ICD-10-CM

## 2017-04-22 DIAGNOSIS — Z79899 Other long term (current) drug therapy: Secondary | ICD-10-CM

## 2017-04-22 DIAGNOSIS — T50904A Poisoning by unspecified drugs, medicaments and biological substances, undetermined, initial encounter: Secondary | ICD-10-CM

## 2017-04-22 DIAGNOSIS — Z8249 Family history of ischemic heart disease and other diseases of the circulatory system: Secondary | ICD-10-CM

## 2017-04-22 DIAGNOSIS — F1410 Cocaine abuse, uncomplicated: Secondary | ICD-10-CM | POA: Diagnosis present

## 2017-04-22 DIAGNOSIS — I10 Essential (primary) hypertension: Secondary | ICD-10-CM | POA: Diagnosis present

## 2017-04-22 DIAGNOSIS — Z808 Family history of malignant neoplasm of other organs or systems: Secondary | ICD-10-CM

## 2017-04-22 DIAGNOSIS — R402423 Glasgow coma scale score 9-12, at hospital admission: Secondary | ICD-10-CM | POA: Diagnosis present

## 2017-04-22 DIAGNOSIS — X58XXXA Exposure to other specified factors, initial encounter: Secondary | ICD-10-CM | POA: Diagnosis present

## 2017-04-22 DIAGNOSIS — M1990 Unspecified osteoarthritis, unspecified site: Secondary | ICD-10-CM | POA: Diagnosis present

## 2017-04-22 DIAGNOSIS — Z79891 Long term (current) use of opiate analgesic: Secondary | ICD-10-CM

## 2017-04-22 DIAGNOSIS — G4730 Sleep apnea, unspecified: Secondary | ICD-10-CM | POA: Diagnosis present

## 2017-04-22 DIAGNOSIS — Y92810 Car as the place of occurrence of the external cause: Secondary | ICD-10-CM

## 2017-04-22 LAB — COMPREHENSIVE METABOLIC PANEL
ALT: 49 U/L (ref 14–54)
ANION GAP: 9 (ref 5–15)
AST: 54 U/L — ABNORMAL HIGH (ref 15–41)
Albumin: 3.6 g/dL (ref 3.5–5.0)
Alkaline Phosphatase: 64 U/L (ref 38–126)
BUN: 17 mg/dL (ref 6–20)
CO2: 24 mmol/L (ref 22–32)
Calcium: 9.6 mg/dL (ref 8.9–10.3)
Chloride: 101 mmol/L (ref 101–111)
Creatinine, Ser: 1.68 mg/dL — ABNORMAL HIGH (ref 0.44–1.00)
GFR calc non Af Amer: 36 mL/min — ABNORMAL LOW (ref >60)
GFR, EST AFRICAN AMERICAN: 42 mL/min — AB (ref >60)
Glucose, Bld: 101 mg/dL — ABNORMAL HIGH (ref 65–99)
Potassium: 3.7 mmol/L (ref 3.5–5.1)
SODIUM: 134 mmol/L — AB (ref 135–145)
Total Bilirubin: 0.6 mg/dL (ref 0.3–1.2)
Total Protein: 7.4 g/dL (ref 6.5–8.1)

## 2017-04-22 LAB — I-STAT ARTERIAL BLOOD GAS, ED
Acid-base deficit: 2 mmol/L (ref 0.0–2.0)
Bicarbonate: 23.3 mmol/L (ref 20.0–28.0)
O2 SAT: 99 %
PCO2 ART: 42.1 mmHg (ref 32.0–48.0)
PO2 ART: 164 mmHg — AB (ref 83.0–108.0)
Patient temperature: 98.2
TCO2: 25 mmol/L (ref 22–32)
pH, Arterial: 7.35 (ref 7.350–7.450)

## 2017-04-22 LAB — CBC WITH DIFFERENTIAL/PLATELET
BASOS PCT: 1 %
Basophils Absolute: 0 10*3/uL (ref 0.0–0.1)
EOS ABS: 0.2 10*3/uL (ref 0.0–0.7)
Eosinophils Relative: 2 %
HCT: 38.7 % (ref 36.0–46.0)
Hemoglobin: 12.9 g/dL (ref 12.0–15.0)
Lymphocytes Relative: 46 %
Lymphs Abs: 3.8 10*3/uL (ref 0.7–4.0)
MCH: 30.3 pg (ref 26.0–34.0)
MCHC: 33.3 g/dL (ref 30.0–36.0)
MCV: 90.8 fL (ref 78.0–100.0)
MONOS PCT: 7 %
Monocytes Absolute: 0.6 10*3/uL (ref 0.1–1.0)
NEUTROS ABS: 3.6 10*3/uL (ref 1.7–7.7)
NEUTROS PCT: 44 %
PLATELETS: 215 10*3/uL (ref 150–400)
RBC: 4.26 MIL/uL (ref 3.87–5.11)
RDW: 13.5 % (ref 11.5–15.5)
WBC: 8.1 10*3/uL (ref 4.0–10.5)

## 2017-04-22 LAB — RAPID URINE DRUG SCREEN, HOSP PERFORMED
Amphetamines: NOT DETECTED
BENZODIAZEPINES: POSITIVE — AB
Barbiturates: POSITIVE — AB
COCAINE: POSITIVE — AB
OPIATES: NOT DETECTED
Tetrahydrocannabinol: NOT DETECTED

## 2017-04-22 LAB — I-STAT CG4 LACTIC ACID, ED: Lactic Acid, Venous: 0.94 mmol/L (ref 0.5–1.9)

## 2017-04-22 LAB — ETHANOL

## 2017-04-22 LAB — PHOSPHORUS: PHOSPHORUS: 4.4 mg/dL (ref 2.5–4.6)

## 2017-04-22 LAB — ACETAMINOPHEN LEVEL

## 2017-04-22 LAB — TROPONIN I: Troponin I: <0.03 ng/mL (ref <0.03)

## 2017-04-22 LAB — CREATININE, URINE, RANDOM: CREATININE, URINE: 241.6 mg/dL

## 2017-04-22 LAB — SALICYLATE LEVEL: Salicylate Lvl: <7 mg/dL (ref 2.8–30.0)

## 2017-04-22 LAB — I-STAT BETA HCG BLOOD, ED (MC, WL, AP ONLY)

## 2017-04-22 LAB — AMMONIA
AMMONIA: 25 umol/L (ref 9–35)
Ammonia: 37 umol/L — ABNORMAL HIGH (ref 9–35)

## 2017-04-22 LAB — MAGNESIUM: MAGNESIUM: 1.8 mg/dL (ref 1.7–2.4)

## 2017-04-22 MED ORDER — ALBUTEROL SULFATE (2.5 MG/3ML) 0.083% IN NEBU: 2.5000 mg | INHALATION_SOLUTION | RESPIRATORY_TRACT | Status: DC | PRN

## 2017-04-22 MED ORDER — SODIUM CHLORIDE 0.9 % IV BOLUS (SEPSIS)
1000.0000 mL | Freq: Once | INTRAVENOUS | Status: AC
  Administered 2017-04-22: 1000 mL via INTRAVENOUS

## 2017-04-22 MED ORDER — ONDANSETRON HCL 4 MG/2ML IJ SOLN: 4.0000 mg | Freq: Four times a day (QID) | INTRAMUSCULAR | Status: DC | PRN

## 2017-04-22 MED ORDER — NALOXONE HCL 0.4 MG/ML IJ SOLN
0.4000 mg | INTRAMUSCULAR | Status: DC | PRN
  Administered 2017-04-22: 0.4 mg via INTRAVENOUS
  Filled 2017-04-22: qty 1

## 2017-04-22 MED ORDER — LORAZEPAM 2 MG/ML IJ SOLN: 1.0000 mg | Freq: Four times a day (QID) | INTRAMUSCULAR | Status: DC | PRN

## 2017-04-22 MED ORDER — FLUMAZENIL 0.5 MG/5ML IV SOLN
0.3000 mg | Freq: Once | INTRAVENOUS | Status: DC | PRN
  Filled 2017-04-22: qty 5

## 2017-04-22 MED ORDER — ONDANSETRON HCL 4 MG PO TABS: 4.0000 mg | ORAL_TABLET | Freq: Four times a day (QID) | ORAL | Status: DC | PRN

## 2017-04-22 MED ORDER — SODIUM CHLORIDE 0.9 % IV SOLN
INTRAVENOUS | Status: DC
  Administered 2017-04-22 – 2017-04-24 (×2): via INTRAVENOUS

## 2017-04-22 MED ORDER — HYDRALAZINE HCL 20 MG/ML IJ SOLN: 10.0000 mg | Freq: Three times a day (TID) | INTRAMUSCULAR | Status: DC | PRN

## 2017-04-22 MED ORDER — SENNOSIDES-DOCUSATE SODIUM 8.6-50 MG PO TABS
2.0000 | ORAL_TABLET | Freq: Two times a day (BID) | ORAL | Status: DC
  Administered 2017-04-23 – 2017-04-24 (×3): 2 via ORAL
  Filled 2017-04-22 (×5): qty 2

## 2017-04-22 NOTE — ED Notes (Signed)
Pt remains very sleepy , will arouse to voice

## 2017-04-22 NOTE — ED Triage Notes (Signed)
Pt brought to ED by [facility] for drug overdose. Pt states she took one Suboxone at 4 pm yesterday and 2  Xanax of one mg, pt got Xanax refill yesterday with 90 pills and there is only 32 pill. Narcan given by [employer] pt AOx 4, but sleepy and slurred speech.

## 2017-04-22 NOTE — ED Notes (Signed)
Report called  
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
## 2017-04-22 NOTE — Progress Notes (Signed)
Dr. Melynda Ripple has been paged twice re: pt's NPO status and order sets since pt's arrival on the unit. No orders have been changed, still awaiting response from MD.
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
## 2017-04-22 NOTE — ED Notes (Signed)
Patient transported to CT 
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
## 2017-04-23 DIAGNOSIS — Z79891 Long term (current) use of opiate analgesic: Secondary | ICD-10-CM | POA: Diagnosis not present

## 2017-04-23 DIAGNOSIS — Z808 Family history of malignant neoplasm of other organs or systems: Secondary | ICD-10-CM | POA: Diagnosis not present

## 2017-04-23 DIAGNOSIS — F1410 Cocaine abuse, uncomplicated: Secondary | ICD-10-CM | POA: Diagnosis present

## 2017-04-23 DIAGNOSIS — Z888 Allergy status to other drugs, medicaments and biological substances status: Secondary | ICD-10-CM | POA: Diagnosis not present

## 2017-04-23 DIAGNOSIS — R0902 Hypoxemia: Secondary | ICD-10-CM | POA: Diagnosis present

## 2017-04-23 DIAGNOSIS — R0989 Other specified symptoms and signs involving the circulatory and respiratory systems: Secondary | ICD-10-CM | POA: Diagnosis present

## 2017-04-23 DIAGNOSIS — X58XXXA Exposure to other specified factors, initial encounter: Secondary | ICD-10-CM | POA: Diagnosis present

## 2017-04-23 DIAGNOSIS — Z8614 Personal history of Methicillin resistant Staphylococcus aureus infection: Secondary | ICD-10-CM | POA: Diagnosis not present

## 2017-04-23 DIAGNOSIS — Z791 Long term (current) use of non-steroidal anti-inflammatories (NSAID): Secondary | ICD-10-CM | POA: Diagnosis not present

## 2017-04-23 DIAGNOSIS — G8929 Other chronic pain: Secondary | ICD-10-CM | POA: Diagnosis present

## 2017-04-23 DIAGNOSIS — F332 Major depressive disorder, recurrent severe without psychotic features: Secondary | ICD-10-CM | POA: Diagnosis present

## 2017-04-23 DIAGNOSIS — E669 Obesity, unspecified: Secondary | ICD-10-CM | POA: Diagnosis present

## 2017-04-23 DIAGNOSIS — N179 Acute kidney failure, unspecified: Secondary | ICD-10-CM | POA: Diagnosis present

## 2017-04-23 DIAGNOSIS — Z79899 Other long term (current) drug therapy: Secondary | ICD-10-CM | POA: Diagnosis not present

## 2017-04-23 DIAGNOSIS — T50904S Poisoning by unspecified drugs, medicaments and biological substances, undetermined, sequela: Secondary | ICD-10-CM

## 2017-04-23 DIAGNOSIS — G4730 Sleep apnea, unspecified: Secondary | ICD-10-CM | POA: Diagnosis present

## 2017-04-23 DIAGNOSIS — F331 Major depressive disorder, recurrent, moderate: Secondary | ICD-10-CM | POA: Diagnosis not present

## 2017-04-23 DIAGNOSIS — G9341 Metabolic encephalopathy: Secondary | ICD-10-CM | POA: Diagnosis present

## 2017-04-23 DIAGNOSIS — T50904A Poisoning by unspecified drugs, medicaments and biological substances, undetermined, initial encounter: Secondary | ICD-10-CM

## 2017-04-23 DIAGNOSIS — I10 Essential (primary) hypertension: Secondary | ICD-10-CM | POA: Diagnosis present

## 2017-04-23 DIAGNOSIS — M1990 Unspecified osteoarthritis, unspecified site: Secondary | ICD-10-CM | POA: Diagnosis present

## 2017-04-23 DIAGNOSIS — F14120 Cocaine abuse with intoxication, uncomplicated: Secondary | ICD-10-CM | POA: Diagnosis not present

## 2017-04-23 DIAGNOSIS — Y92810 Car as the place of occurrence of the external cause: Secondary | ICD-10-CM | POA: Diagnosis not present

## 2017-04-23 DIAGNOSIS — F17210 Nicotine dependence, cigarettes, uncomplicated: Secondary | ICD-10-CM | POA: Diagnosis present

## 2017-04-23 DIAGNOSIS — R402423 Glasgow coma scale score 9-12, at hospital admission: Secondary | ICD-10-CM | POA: Diagnosis present

## 2017-04-23 DIAGNOSIS — Z792 Long term (current) use of antibiotics: Secondary | ICD-10-CM | POA: Diagnosis not present

## 2017-04-23 DIAGNOSIS — Z823 Family history of stroke: Secondary | ICD-10-CM | POA: Diagnosis not present

## 2017-04-23 DIAGNOSIS — T424X4A Poisoning by benzodiazepines, undetermined, initial encounter: Secondary | ICD-10-CM | POA: Diagnosis present

## 2017-04-23 DIAGNOSIS — Z6841 Body Mass Index (BMI) 40.0 and over, adult: Secondary | ICD-10-CM | POA: Diagnosis not present

## 2017-04-23 DIAGNOSIS — Z8249 Family history of ischemic heart disease and other diseases of the circulatory system: Secondary | ICD-10-CM | POA: Diagnosis not present

## 2017-04-23 DIAGNOSIS — Z886 Allergy status to analgesic agent status: Secondary | ICD-10-CM | POA: Diagnosis not present

## 2017-04-23 LAB — UREA NITROGEN, URINE: Urea Nitrogen, Ur: 473 mg/dL

## 2017-04-23 LAB — BASIC METABOLIC PANEL
Anion gap: 9 (ref 5–15)
BUN: 12 mg/dL (ref 6–20)
CALCIUM: 8.4 mg/dL — AB (ref 8.9–10.3)
CO2: 17 mmol/L — AB (ref 22–32)
Chloride: 114 mmol/L — ABNORMAL HIGH (ref 101–111)
Creatinine, Ser: 0.83 mg/dL (ref 0.44–1.00)
GFR calc non Af Amer: >60 mL/min (ref >60)
Glucose, Bld: 79 mg/dL (ref 65–99)
POTASSIUM: 3.9 mmol/L (ref 3.5–5.1)
Sodium: 140 mmol/L (ref 135–145)

## 2017-04-23 LAB — CBC
HCT: 35.7 % — ABNORMAL LOW (ref 36.0–46.0)
HEMOGLOBIN: 11.6 g/dL — AB (ref 12.0–15.0)
MCH: 29.9 pg (ref 26.0–34.0)
MCHC: 32.5 g/dL (ref 30.0–36.0)
MCV: 92 fL (ref 78.0–100.0)
Platelets: 152 10*3/uL (ref 150–400)
RBC: 3.88 MIL/uL (ref 3.87–5.11)
RDW: 13.4 % (ref 11.5–15.5)
WBC: 5.9 10*3/uL (ref 4.0–10.5)

## 2017-04-23 LAB — GLUCOSE, CAPILLARY: GLUCOSE-CAPILLARY: 72 mg/dL (ref 65–99)

## 2017-04-23 MED ORDER — ACETAMINOPHEN 500 MG PO TABS: 1000.0000 mg | ORAL_TABLET | Freq: Four times a day (QID) | ORAL | Status: DC | PRN

## 2017-04-23 MED ORDER — MUSCLE RUB 10-15 % EX CREA
TOPICAL_CREAM | Freq: Two times a day (BID) | CUTANEOUS | Status: DC | PRN
  Administered 2017-04-23: 02:00:00 via TOPICAL
  Filled 2017-04-23: qty 85

## 2017-04-23 MED ORDER — IBUPROFEN 400 MG PO TABS
400.0000 mg | ORAL_TABLET | Freq: Four times a day (QID) | ORAL | Status: DC | PRN
  Administered 2017-04-23: 400 mg via ORAL
  Filled 2017-04-23: qty 1

## 2017-04-23 NOTE — Progress Notes (Signed)
Patient requesting something to eat or drink.  Complaining of pain in the left leg and foot.  Received an order for Advil and it was given with sips of water.  Patient more alert than at change of shift.  Will continue to monitor.  Macarthur Critchley, RN
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
## 2017-04-24 DIAGNOSIS — T50901A Poisoning by unspecified drugs, medicaments and biological substances, accidental (unintentional), initial encounter: Secondary | ICD-10-CM

## 2017-04-24 LAB — BASIC METABOLIC PANEL
Anion gap: 5 (ref 5–15)
BUN: 7 mg/dL (ref 6–20)
CHLORIDE: 110 mmol/L (ref 101–111)
CO2: 22 mmol/L (ref 22–32)
Calcium: 8.4 mg/dL — ABNORMAL LOW (ref 8.9–10.3)
Creatinine, Ser: 0.77 mg/dL (ref 0.44–1.00)
Glucose, Bld: 82 mg/dL (ref 65–99)
POTASSIUM: 3.6 mmol/L (ref 3.5–5.1)
Sodium: 137 mmol/L (ref 135–145)

## 2017-04-24 LAB — CBC
HCT: 33.8 % — ABNORMAL LOW (ref 36.0–46.0)
HEMOGLOBIN: 11.1 g/dL — AB (ref 12.0–15.0)
MCH: 29.9 pg (ref 26.0–34.0)
MCHC: 32.8 g/dL (ref 30.0–36.0)
MCV: 91.1 fL (ref 78.0–100.0)
PLATELETS: 147 10*3/uL — AB (ref 150–400)
RBC: 3.71 MIL/uL — AB (ref 3.87–5.11)
RDW: 13.3 % (ref 11.5–15.5)
WBC: 5.6 10*3/uL (ref 4.0–10.5)

## 2017-04-24 LAB — MRSA PCR SCREENING: MRSA BY PCR: NEGATIVE

## 2017-04-24 MED ORDER — BUPRENORPHINE HCL 2 MG SL SUBL
8.0000 mg | SUBLINGUAL_TABLET | Freq: Two times a day (BID) | SUBLINGUAL | Status: DC
  Administered 2017-04-24: 8 mg via SUBLINGUAL
  Filled 2017-04-24: qty 4

## 2017-04-24 MED ORDER — GABAPENTIN 400 MG PO CAPS
800.0000 mg | ORAL_CAPSULE | Freq: Three times a day (TID) | ORAL | Status: DC
  Administered 2017-04-24: 800 mg via ORAL
  Filled 2017-04-24: qty 2

## 2017-04-24 MED ORDER — IBUPROFEN 400 MG PO TABS: 800.0000 mg | ORAL_TABLET | Freq: Three times a day (TID) | ORAL | Status: DC | PRN

## 2017-04-24 MED ORDER — ALPRAZOLAM 0.5 MG PO TABS
1.0000 mg | ORAL_TABLET | Freq: Three times a day (TID) | ORAL | Status: DC
  Administered 2017-04-24: 1 mg via ORAL
  Filled 2017-04-24: qty 2

## 2017-04-24 MED ORDER — AMPHETAMINE-DEXTROAMPHET ER 10 MG PO CP24: 30.0000 mg | ORAL_CAPSULE | Freq: Every day | ORAL | Status: DC

## 2017-04-24 MED ORDER — IPRATROPIUM BROMIDE 0.02 % IN SOLN: 0.5000 mg | Freq: Four times a day (QID) | RESPIRATORY_TRACT | Status: DC | PRN

## 2017-04-24 NOTE — Progress Notes (Signed)
Patient assisted to the restroom and voided 700 of urine, when patient wiped there was blood on the tissue.  Will continue to monitor.

## 2017-04-24 NOTE — Evaluation (Signed)
Physical Therapy Evaluation and Discharge Patient Details Name: Judy Robles MRN: [id number] DOB: 07-Jan-1976 Today's Date: 04/24/2017   History of Present Illness  Pt is a 41 y/o female admitted secondary to drug overdose. PMH includes depression, arthritis, HTN, sleep apnea, and polysubstance dependence.   Clinical Impression  Patient evaluated by Physical Therapy with no further acute PT needs identified. All education has been completed and the patient has no further questions. Pt at independent to supervision level for all mobility. Some R foot pain noted, however, pt reports that is her baseline. Pt scored 20 on DGI indicating low fall risk. See below for any follow-up Physical Therapy or equipment needs. PT is signing off. Thank you for this referral. If needs change, please re-consult.      Follow Up Recommendations No PT follow up    Equipment Recommendations  None recommended by PT    Recommendations for Other Services       Precautions / Restrictions Precautions Precautions: None Restrictions Weight Bearing Restrictions: No      Mobility  Bed Mobility Overal bed mobility: Independent             General bed mobility comments: No assist required   Transfers Overall transfer level: Modified independent Equipment used: None             General transfer comment: Steady upon standing. No assist required   Ambulation/Gait Ambulation/Gait assistance: Supervision Ambulation Distance (Feet): 200 Feet Assistive device: None Gait Pattern/deviations: Step-through pattern;Decreased step length - right;Decreased step length - left;Decreased weight shift to right;Antalgic Gait velocity: WNL  Gait velocity interpretation: at or above normal speed for age/gender General Gait Details: Slightly antalgic gait secondary to R foot pain at baseline, however, pt reports gait is at her baseline. Supervision for safety. Able to perform dynamic gait tasks without LOB.    Stairs Stairs: Yes Stairs assistance: Supervision Stair Management: One rail Left;Alternating pattern;Step to pattern;Forwards Number of Stairs: 8 General stair comments: Slow, cautious stair navigation, however, overall steady. Use of alternating pattern for ascending, however, used step to pattern during descent for increased safety. Verbal cues for sequencing given RLE pain.   Wheelchair Mobility    Modified Rankin (Stroke Patients Only)       Balance Overall balance assessment: Needs assistance Sitting-balance support: No upper extremity supported;Feet supported Sitting balance-Leahy Scale: Good     Standing balance support: No upper extremity supported;During functional activity Standing balance-Leahy Scale: Good                   Standardized Balance Assessment Standardized Balance Assessment : Dynamic Gait Index   Dynamic Gait Index Level Surface: Normal Change in Gait Speed: Normal Gait with Horizontal Head Turns: Normal Gait with Vertical Head Turns: Normal Gait and Pivot Turn: Mild Impairment Step Over Obstacle: Mild Impairment Step Around Obstacles: Normal Steps: Moderate Impairment Total Score: 20       Pertinent Vitals/Pain Pain Assessment: 0-10 Pain Score: 6  Pain Location: R foot  Pain Descriptors / Indicators: Aching Pain Intervention(s): Limited activity within patient's tolerance;Monitored during session;Repositioned    Home Living Family/patient expects to be discharged to:: Private residence Living Arrangements: Non-relatives/Friends Available Help at Discharge: Friend(s);Available PRN/intermittently;Family Type of Home: Apartment Home Access: Stairs to enter Entrance Stairs-Rails: Doctor, general practice of Steps: 3 Home Layout: One level Home Equipment: None      Prior Function Level of Independence: Independent  Hand Dominance   Dominant Hand: Right    Extremity/Trunk Assessment   Upper  Extremity Assessment Upper Extremity Assessment: RUE deficits/detail;LUE deficits/detail RUE Deficits / Details: Numbness in fingertips  LUE Deficits / Details: Numbness in fingertips     Lower Extremity Assessment Lower Extremity Assessment: RLE deficits/detail;Overall [employer] for tasks assessed RLE Deficits / Details: R foot pain at baseline secondary to previous surgery 1 yr ago.     Cervical / Trunk Assessment Cervical / Trunk Assessment: Normal  Communication   Communication: No difficulties  Cognition Arousal/Alertness: Awake/alert Behavior During Therapy: WFL for tasks assessed/performed Overall Cognitive Status: Within Functional Limits for tasks assessed                                        General Comments General comments (skin integrity, edema, etc.): Pt reports she feels like she is at her normal. Discussed ankle ROM exercises to help decrease pain in R foot.     Exercises     Assessment/Plan    PT Assessment Patent does not need any further PT services  PT Problem List         PT Treatment Interventions      PT Goals (Current goals can be found in the Care Plan section)  Acute Rehab PT Goals Patient Stated Goal: to go home  PT Goal Formulation: With patient Time For Goal Achievement: 04/24/17 Potential to Achieve Goals: Good    Frequency     Barriers to discharge        Co-evaluation               AM-PAC PT "6 Clicks" Daily Activity  Outcome Measure Difficulty turning over in bed (including adjusting bedclothes, sheets and blankets)?: None Difficulty moving from lying on back to sitting on the side of the bed? : None Difficulty sitting down on and standing up from a chair with arms (e.g., wheelchair, bedside commode, etc,.)?: None Help needed moving to and from a bed to chair (including a wheelchair)?: None Help needed walking in hospital room?: None Help needed climbing 3-5 steps with a railing? : None 6 Click Score: 24     End of Session Equipment Utilized During Treatment: Gait belt Activity Tolerance: Patient tolerated treatment well Patient left: in bed;with call bell/phone within reach Nurse Communication: Mobility status PT Visit Diagnosis: Other abnormalities of gait and mobility (R26.89)    Time: 5681-2751 PT Time Calculation (min) (ACUTE ONLY): 16 min   Charges:   PT Evaluation $PT Eval Low Complexity: 1 Low     PT G Codes:        Gladys Damme, PT, DPT  Acute Rehabilitation Services  Pager: [phone]   Lehman Prom 04/24/2017, 9:42 AM
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
## 2017-04-24 NOTE — Progress Notes (Signed)
Went searching for patient's Suboxone and Xanax prescriptions not in pharmacy or security lockup. Consulted with the ED triage nurse and [employer]. They stated that if patient is brought in with suspected drug overdose the police will take the medicine and destroy it at the police department. I advised the pt. About the situation and she was dismayed and concerned with withdrawal. I told her I will follow up after I talk to my charge nurse about what to do next.

## 2017-04-24 NOTE — Progress Notes (Addendum)
Nsg Discharge Note  Admit Date:  04/22/2017 Discharge date: 04/24/2017   [name] to be D/C'd Home per MD order.  AVS completed.  Copy for chart, and copy for patient signed, and dated. Patient/caregiver able to verbalize understanding.  Discharge Medication: Allergies as of 04/24/2017      Reactions   Tylenol [acetaminophen] Itching, Other (See Comments)   Welts   Lisinopril Other (See Comments)   Shuts down kidneys      Medication List    TAKE these medications   ADDERALL XR 30 MG 24 hr capsule Generic drug:  amphetamine-dextroamphetamine Take 30 mg by mouth daily.   ALPRAZolam 1 MG tablet Commonly known as:  XANAX Take 1 mg by mouth 3 (three) times daily.   Buprenorphine HCl-Naloxone HCl 8-2 MG Film Commonly known as:  SUBOXONE Place 1 Film under the tongue 2 (two) times daily.   busPIRone 15 MG tablet Commonly known as:  BUSPAR Take 7.5 mg by mouth 2 (two) times daily.   etodolac 300 MG capsule Commonly known as:  LODINE Take 1 capsule (300 mg total) by mouth every 8 (eight) hours.   gabapentin 400 MG capsule Commonly known as:  NEURONTIN Take 800 mg by mouth 3 (three) times daily.   ibuprofen 800 MG tablet Commonly known as:  ADVIL,MOTRIN Take 800 mg by mouth 3 (three) times daily as needed for pain.   ipratropium 17 MCG/ACT inhaler Commonly known as:  ATROVENT HFA Inhale 2 puffs into the lungs every 6 (six) hours as needed for wheezing.   PROAIR HFA 108 (90 Base) MCG/ACT inhaler Generic drug:  albuterol Inhale 2 puffs into the lungs every 6 (six) hours as needed for wheezing or shortness of breath.   senna-docusate 8.6-50 MG tablet Commonly known as:  Senokot-S Take 2 tablets by mouth 2 (two) times daily.            Discharge Care Instructions        Start     Ordered   04/24/17 0000  Increase activity slowly     04/24/17 0955   04/24/17 0000  Diet - low sodium heart healthy     04/24/17 0955      Discharge Assessment: Vitals:   04/24/17 0459 04/24/17 1306  BP: 139/68 (!) 153/86  Pulse: 62 63  Resp: 16 16  Temp: 98.2 F (36.8 C) 98.4 F (36.9 C)  SpO2: 99% 97%   Skin clean, dry and intact without evidence of skin break down, no evidence of skin tears noted. IV catheter discontinued intact. Site without signs and symptoms of complications - no redness or edema noted at insertion site, patient denies c/o pain - only slight tenderness at site.  Dressing with slight pressure applied.  D/c Instructions-Education: Discharge instructions given to patient/family with verbalized understanding. D/c education completed with patient/family including follow up instructions, medication list, d/c activities limitations if indicated, with other d/c instructions as indicated by MD - patient able to verbalize understanding, all questions fully answered. Patient instructed to return to ED, call 911, or call MD for any changes in condition.  Patient escorted via WC, and D/C home via taxi.  Lorene Dy, RN 04/24/2017 1:17 PM

## 2017-05-01 ENCOUNTER — Ambulatory Visit (HOSPITAL_COMMUNITY): Payer: Self-pay

## 2017-05-01 ENCOUNTER — Encounter (HOSPITAL_COMMUNITY): Payer: Self-pay | Admitting: Emergency Medicine

## 2017-05-01 ENCOUNTER — Emergency Department (HOSPITAL_COMMUNITY): Payer: Medicaid Other

## 2017-05-01 ENCOUNTER — Emergency Department (HOSPITAL_COMMUNITY)
Admission: EM | Admit: 2017-05-01 | Discharge: 2017-05-01 | Disposition: A | Discharge status: Home / Self Care | Payer: Medicaid Other | Attending: Emergency Medicine | Admitting: Emergency Medicine

## 2017-05-01 DIAGNOSIS — M79671 Pain in right foot: Secondary | ICD-10-CM | POA: Diagnosis not present

## 2017-05-01 DIAGNOSIS — Z5321 Procedure and treatment not carried out due to patient leaving prior to being seen by health care provider: Secondary | ICD-10-CM | POA: Diagnosis not present

## 2017-05-01 NOTE — ED Triage Notes (Signed)
Pt comes with complaints of a possible UTI and confusion related to it.  States she has been weaker than normal since being diagnosed with it and fell off the bed this morning hurting her right foot.  Hx of surgery on that foot.  Pt reports she was unable to take all of her antibiotics.  Vital signs do not concern for sepsis at this time.  Pt A&O x4 during triage.  Not able to ambulate freely with injured foot.

## 2017-05-01 NOTE — ED Triage Notes (Addendum)
Per EMS-states pain all over since yesterday-states she has'nt taken methadone in a couple of days but took it today-states right foot pain and swelling from old injury

## 2017-05-26 ENCOUNTER — Encounter (HOSPITAL_COMMUNITY): Payer: Self-pay | Admitting: Emergency Medicine

## 2017-05-26 ENCOUNTER — Emergency Department (HOSPITAL_COMMUNITY)
Admission: EM | Admit: 2017-05-26 | Discharge: 2017-05-26 | Disposition: A | Discharge status: Home / Self Care | Payer: Medicaid Other | Attending: Emergency Medicine | Admitting: Emergency Medicine

## 2017-05-26 DIAGNOSIS — M79605 Pain in left leg: Secondary | ICD-10-CM | POA: Diagnosis not present

## 2017-05-26 DIAGNOSIS — M549 Dorsalgia, unspecified: Secondary | ICD-10-CM | POA: Insufficient documentation

## 2017-05-26 DIAGNOSIS — M79604 Pain in right leg: Secondary | ICD-10-CM | POA: Insufficient documentation

## 2017-05-26 DIAGNOSIS — Z5321 Procedure and treatment not carried out due to patient leaving prior to being seen by health care provider: Secondary | ICD-10-CM | POA: Diagnosis not present

## 2017-05-26 NOTE — ED Notes (Signed)
No answer for room x2 

## 2017-05-26 NOTE — ED Notes (Signed)
Pt did not answer when called for room 

## 2017-05-26 NOTE — ED Triage Notes (Signed)
Per EMS, pt has multiple complaints. c/o back pain that radiates down both legs. Pt also wants to be checked for lupus and diabetes.

## 2017-06-20 ENCOUNTER — Encounter (HOSPITAL_COMMUNITY): Payer: Self-pay | Admitting: *Deleted

## 2017-06-20 ENCOUNTER — Emergency Department (HOSPITAL_COMMUNITY)
Admission: EM | Admit: 2017-06-20 | Discharge: 2017-06-21 | Disposition: A | Discharge status: Home / Self Care | Payer: Medicaid Other | Attending: Emergency Medicine | Admitting: Emergency Medicine

## 2017-06-20 ENCOUNTER — Emergency Department (HOSPITAL_COMMUNITY): Payer: Medicaid Other

## 2017-06-20 DIAGNOSIS — M6281 Muscle weakness (generalized): Secondary | ICD-10-CM | POA: Insufficient documentation

## 2017-06-20 DIAGNOSIS — R112 Nausea with vomiting, unspecified: Secondary | ICD-10-CM

## 2017-06-20 DIAGNOSIS — R400 Somnolence: Secondary | ICD-10-CM | POA: Diagnosis not present

## 2017-06-20 DIAGNOSIS — F17210 Nicotine dependence, cigarettes, uncomplicated: Secondary | ICD-10-CM | POA: Diagnosis not present

## 2017-06-20 DIAGNOSIS — I10 Essential (primary) hypertension: Secondary | ICD-10-CM | POA: Diagnosis not present

## 2017-06-20 DIAGNOSIS — Z79899 Other long term (current) drug therapy: Secondary | ICD-10-CM | POA: Diagnosis not present

## 2017-06-20 DIAGNOSIS — R531 Weakness: Secondary | ICD-10-CM

## 2017-06-20 LAB — HEPATIC FUNCTION PANEL
ALT: 37 U/L (ref 14–54)
AST: 41 U/L (ref 15–41)
Albumin: 3.4 g/dL — ABNORMAL LOW (ref 3.5–5.0)
Alkaline Phosphatase: 68 U/L (ref 38–126)
BILIRUBIN INDIRECT: 0.4 mg/dL (ref 0.3–0.9)
Bilirubin, Direct: 0.1 mg/dL (ref 0.1–0.5)
TOTAL PROTEIN: 7.4 g/dL (ref 6.5–8.1)
Total Bilirubin: 0.5 mg/dL (ref 0.3–1.2)

## 2017-06-20 LAB — URINALYSIS, COMPLETE (UACMP) WITH MICROSCOPIC
Bilirubin Urine: NEGATIVE
GLUCOSE, UA: NEGATIVE mg/dL
Hgb urine dipstick: NEGATIVE
Ketones, ur: NEGATIVE mg/dL
LEUKOCYTES UA: NEGATIVE
Nitrite: NEGATIVE
PH: 6 (ref 5.0–8.0)
Protein, ur: NEGATIVE mg/dL
SPECIFIC GRAVITY, URINE: 1.005 (ref 1.005–1.030)

## 2017-06-20 LAB — URINALYSIS, ROUTINE W REFLEX MICROSCOPIC
Bilirubin Urine: NEGATIVE
GLUCOSE, UA: NEGATIVE mg/dL
Hgb urine dipstick: NEGATIVE
Ketones, ur: NEGATIVE mg/dL
LEUKOCYTES UA: NEGATIVE
Nitrite: NEGATIVE
PH: 6 (ref 5.0–8.0)
PROTEIN: NEGATIVE mg/dL
Specific Gravity, Urine: 1.017 (ref 1.005–1.030)

## 2017-06-20 LAB — BASIC METABOLIC PANEL
Anion gap: 9 (ref 5–15)
BUN: 16 mg/dL (ref 6–20)
CALCIUM: 9.3 mg/dL (ref 8.9–10.3)
CHLORIDE: 108 mmol/L (ref 101–111)
CO2: 21 mmol/L — AB (ref 22–32)
CREATININE: 1.02 mg/dL — AB (ref 0.44–1.00)
GFR calc Af Amer: >60 mL/min (ref >60)
GFR calc non Af Amer: >60 mL/min (ref >60)
Glucose, Bld: 86 mg/dL (ref 65–99)
Potassium: 4 mmol/L (ref 3.5–5.1)
Sodium: 138 mmol/L (ref 135–145)

## 2017-06-20 LAB — RAPID URINE DRUG SCREEN, HOSP PERFORMED
AMPHETAMINES: NOT DETECTED
BARBITURATES: NOT DETECTED
BENZODIAZEPINES: POSITIVE — AB
Cocaine: NOT DETECTED
Opiates: NOT DETECTED
Tetrahydrocannabinol: NOT DETECTED

## 2017-06-20 LAB — CBC
HCT: 38 % (ref 36.0–46.0)
Hemoglobin: 12.5 g/dL (ref 12.0–15.0)
MCH: 31.1 pg (ref 26.0–34.0)
MCHC: 32.9 g/dL (ref 30.0–36.0)
MCV: 94.5 fL (ref 78.0–100.0)
PLATELETS: 188 10*3/uL (ref 150–400)
RBC: 4.02 MIL/uL (ref 3.87–5.11)
RDW: 13.5 % (ref 11.5–15.5)
WBC: 7.3 10*3/uL (ref 4.0–10.5)

## 2017-06-20 LAB — ACETAMINOPHEN LEVEL

## 2017-06-20 LAB — AMMONIA: AMMONIA: 30 umol/L (ref 9–35)

## 2017-06-20 LAB — LIPASE, BLOOD: LIPASE: 31 U/L (ref 11–51)

## 2017-06-20 LAB — I-STAT CG4 LACTIC ACID, ED: Lactic Acid, Venous: 0.93 mmol/L (ref 0.5–1.9)

## 2017-06-20 LAB — ETHANOL

## 2017-06-20 LAB — SALICYLATE LEVEL

## 2017-06-20 LAB — CBG MONITORING, ED: Glucose-Capillary: 86 mg/dL (ref 65–99)

## 2017-06-20 MED ORDER — SODIUM CHLORIDE 0.9 % IV BOLUS (SEPSIS)
1000.0000 mL | Freq: Once | INTRAVENOUS | Status: AC
  Administered 2017-06-20: 1000 mL via INTRAVENOUS

## 2017-06-20 NOTE — ED Notes (Addendum)
IV attempted x 1 without success. Pt able to tell me her name and how she got here. Does take pain medication for back pain, denies use since arrival.

## 2017-06-20 NOTE — ED Notes (Signed)
No answer when trying to take vitals.

## 2017-06-20 NOTE — ED Notes (Signed)
Lab needs another specimen of urine for UDS. Marylene Land RN notified

## 2017-06-20 NOTE — ED Triage Notes (Signed)
To ED for eval of feeling weak. States she was told last week that her creat levels were high but she was too sick to come to the ED. States she has been vomiting and urination has decreased.

## 2017-06-20 NOTE — ED Notes (Signed)
Patient transported to X-ray 

## 2017-06-20 NOTE — ED Notes (Signed)
Pt found sitting, slumped in the wheelchair,in the waiting room; pt lethargic, will respond to name

## 2017-06-20 NOTE — ED Provider Notes (Signed)
[facility] EMERGENCY DEPARTMENT Provider Note   CSN: [id number] Arrival date & time: 06/20/17  1520     History   Chief Complaint Chief Complaint  Patient presents with  . Weakness    HPI Judy Robles is a 41 y.o. female with history of chronic pain on gabapentin, depression, anxiety on Xanax, hypertension, chronic opioid use disorder on Suboxone and recent intentional Xanax overdose 04/2017 presents to ED for generalized weakness 3-4 days. Woke up this morning felt nauseated and threw up 3 times, nonbilious nonbloody emesis. Patient is very somnolent during exam and when asked why she so sleepy she states she has been unable to sleep in the last 3-4 days and she is very tired.  She denies illicit drug use, takes xanax TID and has only taken one today so far. She denies HI, SI, AVH. She denies taking anything to harm herself or overdose today.   No fevers, chills, CP, SOB, abdominal pain, melena, hematochezia, vaginal bleeding or dysuria. She is axo to self, place, time.  She is asking for something to eat. HPI  Past Medical History:  Diagnosis Date  . Arthritis   . Hypertension   . MRSA (methicillin resistant Staphylococcus aureus)   . Obesity   . Sleep apnea     Patient Active Problem List   Diagnosis Date Noted  . Drug overdose 04/22/2017  . Chest pain 10/09/2016  . Pressure injury of skin 10/05/2016  . Community acquired pneumonia of left lung 10/03/2016  . Chronic renal insufficiency 07/22/2016  . Polysubstance dependence including opioid type drug with complication, episodic abuse (HCC) 07/21/2016  . MDD (major depressive disorder), recurrent severe, without psychosis (HCC) 07/21/2016  . Chronic pain 07/03/2015  . Diarrhea 05/01/2014  . Essential hypertension, benign 04/17/2014    Past Surgical History:  Procedure Laterality Date  . I&D EXTREMITY Right 04/16/2014   Procedure: IRRIGATION AND DEBRIDEMENT EXTREMITY;  Surgeon: Sharma Covert, MD;   Location: [facility] OR;  Service: Orthopedics;  Laterality: Right;    OB History    No data available       Home Medications    Prior to Admission medications   Medication Sig Start Date End Date Taking? Authorizing Provider  ADDERALL XR 30 MG 24 hr capsule Take 30 mg by mouth daily. 11/14/16   [provider]  ALPRAZolam Prudy Feeler) 1 MG tablet Take 1 mg by mouth 3 (three) times daily. 09/30/16   [provider]  Buprenorphine HCl-Naloxone HCl (SUBOXONE) 8-2 MG FILM Place 1 Film under the tongue 2 (two) times daily. 10/07/16   Tyson Alias, MD  busPIRone (BUSPAR) 15 MG tablet Take 7.5 mg by mouth 2 (two) times daily. 11/24/16   [provider]  etodolac (LODINE) 300 MG capsule Take 1 capsule (300 mg total) by mouth every 8 (eight) hours. 01/29/17   Linwood Dibbles, MD  gabapentin (NEURONTIN) 400 MG capsule Take 800 mg by mouth 3 (three) times daily.  11/24/16   [provider]  ibuprofen (ADVIL,MOTRIN) 800 MG tablet Take 800 mg by mouth 3 (three) times daily as needed for pain. 11/11/16   [provider]  ipratropium (ATROVENT HFA) 17 MCG/ACT inhaler Inhale 2 puffs into the lungs every 6 (six) hours as needed for wheezing. 12/19/16   Forest Becker, MD  PROAIR HFA 108 [phone] Base) MCG/ACT inhaler Inhale 2 puffs into the lungs every 6 (six) hours as needed for wheezing or shortness of breath. 12/19/16   Forest Becker, MD  senna-docusate (SENOKOT-S) 8.6-50 MG tablet Take 2 tablets by mouth 2 (two) times daily. 10/08/16   Nyra Market, MD    Family History Family History  Problem Relation Age of Onset  . Throat cancer Mother   . Hypertension Father   . Stroke Father     Social History Social History  Substance Use Topics  . Smoking status: Current Every Day Smoker    Packs/day: 1.00    Types: Cigarettes  . Smokeless tobacco: Never Used     Comment: 1/2PPD  . Alcohol use No     Allergies   Tylenol [acetaminophen] and Lisinopril   Review of  Systems Review of Systems  Constitutional: Negative for fever.  Respiratory: Negative for shortness of breath.   Cardiovascular: Negative for chest pain.  Gastrointestinal: Positive for nausea and vomiting. Negative for abdominal pain, blood in stool, constipation and diarrhea.  Genitourinary: Positive for decreased urine volume. Negative for difficulty urinating, dysuria and hematuria.  Musculoskeletal: Positive for myalgias.  Skin: Negative for wound.  Neurological: Positive for weakness (generalized). Negative for syncope, light-headedness and headaches.  Psychiatric/Behavioral: Positive for sleep disturbance. Negative for self-injury and suicidal ideas.     Physical Exam Updated Vital Signs BP 131/71   Pulse 78   Temp 98.2 F (36.8 C) (Oral)   Resp (!) 28   Ht 5\' 3"  (1.6 m)   Wt 108.9 kg (240 lb)   SpO2 98%   BMI 42.51 kg/m   Physical Exam  Constitutional: She is oriented to person, place, and time. She appears well-developed and well-nourished. No distress.  Somnolent, keeps her eyes closed during conversation and speaking but AxO to self place and time.   HENT:  Head: Normocephalic and atraumatic.  Right Ear: External ear normal.  Left Ear: External ear normal.  Nose: Nose normal.  Dry lips, moist mucous membranes Oropharynx and tonsils normal   Eyes: Pupils are equal, round, and reactive to light. Conjunctivae and EOM are normal. No scleral icterus.  3 mm pupils bilaterally, PERRL and EOMs intact bilaterally  Neck: Normal range of motion. Neck supple.  Cardiovascular: Normal rate, regular rhythm and normal heart sounds.   No murmur heard. Pulmonary/Chest: Effort normal and breath sounds normal. She has no wheezes.  Abdominal: Soft. She exhibits no distension. There is no tenderness.  Pt with eyes closed during exam, does not show signs of discomfort with palpation of abdomen No suprapubic or CVA tenderness  Musculoskeletal: Normal range of motion. She exhibits no  deformity.  Neurological: She is alert and oriented to person, place, and time.  Slow speech.  Strength 5/5 with hand grip and ankle flexion/extension.   Sensation to light touch intact in hands and feet. No trunk sway. CN I and VIII not tested. CN II-XII intact bilaterally.   Skin: Skin is warm and dry. Capillary refill takes less than 2 seconds.  Psychiatric: She has a normal mood and affect. Her behavior is normal. Judgment and thought content normal.  Nursing note and vitals reviewed.    ED Treatments / Results  Labs (all labs ordered are listed, but only abnormal results are displayed) Labs Reviewed  BASIC METABOLIC PANEL - Abnormal; Notable for the following:       Result Value   CO2 21 (*)    Creatinine, Ser 1.02 (*)    All other components within normal limits  URINALYSIS, ROUTINE W REFLEX MICROSCOPIC - Abnormal; Notable for the following:    APPearance CLOUDY (*)    All other  components within normal limits  HEPATIC FUNCTION PANEL - Abnormal; Notable for the following:    Albumin 3.4 (*)    All other components within normal limits  ACETAMINOPHEN LEVEL - Abnormal; Notable for the following:    Acetaminophen (Tylenol), Serum <10 (*)    All other components within normal limits  URINALYSIS, COMPLETE (UACMP) WITH MICROSCOPIC - Abnormal; Notable for the following:    Bacteria, UA RARE (*)    Squamous Epithelial / LPF 0-5 (*)    All other components within normal limits  RAPID URINE DRUG SCREEN, HOSP PERFORMED - Abnormal; Notable for the following:    Benzodiazepines POSITIVE (*)    All other components within normal limits  CBC  LIPASE, BLOOD  AMMONIA  SALICYLATE LEVEL  ETHANOL  CBG MONITORING, ED  I-STAT CG4 LACTIC ACID, ED  I-STAT CG4 LACTIC ACID, ED    EKG  EKG Interpretation  Date/Time:  Tuesday June 20 2017 21:18:18 EDT Ventricular Rate:  60 PR Interval:    QRS Duration: 92 QT Interval:  434 QTC Calculation: 434 R Axis:   74 Text Interpretation:   Sinus rhythm Probable left atrial enlargement Confirmed by Jacalyn Lefevre [phone]) on 06/20/2017 9:21:40 PM       Radiology Dg Chest 2 View  Result Date: 06/20/2017 CLINICAL DATA:  41 y/o  F; cough, hypotension, generalized weakness. EXAM: CHEST  2 VIEW COMPARISON:  04/22/2017 chest radiograph FINDINGS: Stable borderline cardiomegaly. Aortic atherosclerosis with calcification. Clear lungs. No pleural effusion or pneumothorax. No acute osseous abnormality is evident. IMPRESSION: No acute pulmonary process identified. Electronically Signed   By: Mitzi Hansen M.D.   On: 06/20/2017 21:47   Ct Head Wo Contrast  Result Date: 06/21/2017 CLINICAL DATA:  Somnolence. EXAM: CT HEAD WITHOUT CONTRAST TECHNIQUE: Contiguous axial images were obtained from the base of the skull through the vertex without intravenous contrast. COMPARISON:  04/22/2017 FINDINGS: Brain: No intracranial hemorrhage, mass effect, or midline shift. No hydrocephalus. The basilar cisterns are patent. No evidence of territorial infarct or acute ischemia. No extra-axial or intracranial fluid collection. Vascular: No hyperdense vessel or unexpected calcification. Skull: No fracture or focal lesion. Sinuses/Orbits: Chronic right maxillary mucosal thickening with mild increase from prior exam. No fluid levels. Mastoid air cells are clear. Visualized orbits are normal. Other: None. IMPRESSION: No acute intracranial abnormality. Electronically Signed   By: Rubye Oaks M.D.   On: 06/21/2017 00:01    Procedures Procedures (including critical care time)  Medications Ordered in ED Medications  sodium chloride 0.9 % bolus 1,000 mL (1,000 mLs Intravenous New Bag/Given 06/20/17 2231)  sodium chloride 0.9 % bolus 1,000 mL (1,000 mLs Intravenous New Bag/Given 06/20/17 2231)     Initial Impression / Assessment and Plan / ED Course  I have reviewed the triage vital signs and the nursing notes.  Pertinent labs & imaging results  that were available during my care of the patient were reviewed by me and considered in my medical decision making (see chart for details).  Clinical Course as of Jun 21 116  Wed Jun 21, 2017  0034 I reevaluated patient. She is now more responsive to verbal stimuli. She denies nausea, vomiting, abdominal pain. Vital signs are within normal limits. Will attempt to ambulate and by mouth challenge, anticipate discharge.  [CG]    Clinical Course User Index [CG] Liberty Handy, PA-C   41 yo female w/ h/o chronic pain on gabapentin, depression and anxiety on xanax, recent intentional xanax OD, presents with generalized weakness,  nausea and vomiting. On exam, she is somnolent and keeps her eyes closed while she is talking. She denies intentional OD and states she has only taken one xanax today and no gabapentin. Given previous documented h/o intentional xanax overdose last month, concern for OD of xanax or gabapentin or both vs GI etiology causing dehydration vs infectious process vs central nervous system cause. She has dry lips and somnolent on exam but is AxOx4 and w/o gross neuro deficits. No abdominal tenderness, G/R/R.   Lab work today including CBC, LFT, BMP, lipase, U/A, lactic acid, CXR, EKG, CT head unremarkable. UDS +BZD. No evidence of infection. Abdomen has remained non tender.    Final Clinical Impressions(s) / ED Diagnoses   Upon re-evaluation pt states she feels better. I personally ambulated patient with RN, she has slow but steady gait, she ambulated out of her room without assistance. She is requesting a sandwich. Pt appropriate for d/c   Final diagnoses:  Generalized weakness  Nausea and vomiting in adult    New Prescriptions New Prescriptions   No medications on file     Krrish Freund, ClaudiJerrell Mylara J, PA-C 06/21/17 0117    Jacalyn LefevreHaviland, Julie, MD 06/23/17 1705

## 2017-06-20 NOTE — ED Notes (Signed)
Still no response when called in waiting room.

## 2017-06-21 ENCOUNTER — Encounter (HOSPITAL_COMMUNITY): Payer: Self-pay | Admitting: Emergency Medicine

## 2017-06-21 DIAGNOSIS — R197 Diarrhea, unspecified: Secondary | ICD-10-CM | POA: Diagnosis not present

## 2017-06-21 DIAGNOSIS — Z5321 Procedure and treatment not carried out due to patient leaving prior to being seen by health care provider: Secondary | ICD-10-CM | POA: Diagnosis not present

## 2017-06-21 LAB — CBC
HEMATOCRIT: 36.3 % (ref 36.0–46.0)
Hemoglobin: 11.7 g/dL — ABNORMAL LOW (ref 12.0–15.0)
MCH: 31.1 pg (ref 26.0–34.0)
MCHC: 32.2 g/dL (ref 30.0–36.0)
MCV: 96.5 fL (ref 78.0–100.0)
Platelets: 173 10*3/uL (ref 150–400)
RBC: 3.76 MIL/uL — ABNORMAL LOW (ref 3.87–5.11)
RDW: 13.9 % (ref 11.5–15.5)
WBC: 8.8 10*3/uL (ref 4.0–10.5)

## 2017-06-21 NOTE — ED Notes (Signed)
Pt was given a sandwich and a drink/also ambulated in the hall

## 2017-06-21 NOTE — ED Triage Notes (Signed)
Pt reports emesis, diarrhea and back pain radiating down to feet. Irritable, EMS states possible withdrawal s/s. Hx using cocaine.

## 2017-06-21 NOTE — Discharge Instructions (Signed)
Your lab work and imaging was normal today.   I suspect you may be weak from dehydration.   Resume all your home medications.   Return for worsening symptoms, fever, inability to keep fluids down.

## 2017-06-22 ENCOUNTER — Emergency Department (HOSPITAL_COMMUNITY)
Admission: EM | Admit: 2017-06-22 | Discharge: 2017-06-22 | Disposition: A | Discharge status: Home / Self Care | Payer: Medicaid Other | Attending: Emergency Medicine | Admitting: Emergency Medicine

## 2017-06-22 LAB — COMPREHENSIVE METABOLIC PANEL
ALBUMIN: 3.3 g/dL — AB (ref 3.5–5.0)
ALT: 38 U/L (ref 14–54)
AST: 43 U/L — AB (ref 15–41)
Alkaline Phosphatase: 67 U/L (ref 38–126)
Anion gap: 6 (ref 5–15)
BILIRUBIN TOTAL: 0.5 mg/dL (ref 0.3–1.2)
BUN: 15 mg/dL (ref 6–20)
CHLORIDE: 106 mmol/L (ref 101–111)
CO2: 25 mmol/L (ref 22–32)
CREATININE: 1.1 mg/dL — AB (ref 0.44–1.00)
Calcium: 9 mg/dL (ref 8.9–10.3)
GFR calc Af Amer: >60 mL/min (ref >60)
GLUCOSE: 150 mg/dL — AB (ref 65–99)
Potassium: 4.4 mmol/L (ref 3.5–5.1)
Sodium: 137 mmol/L (ref 135–145)
TOTAL PROTEIN: 7.3 g/dL (ref 6.5–8.1)

## 2017-06-22 LAB — URINALYSIS, ROUTINE W REFLEX MICROSCOPIC
BILIRUBIN URINE: NEGATIVE
GLUCOSE, UA: NEGATIVE mg/dL
HGB URINE DIPSTICK: NEGATIVE
Ketones, ur: NEGATIVE mg/dL
Leukocytes, UA: NEGATIVE
Nitrite: NEGATIVE
Protein, ur: NEGATIVE mg/dL
SPECIFIC GRAVITY, URINE: 1.006 (ref 1.005–1.030)
pH: 6 (ref 5.0–8.0)

## 2017-06-22 LAB — POC URINE PREG, ED: PREG TEST UR: NEGATIVE

## 2017-06-22 LAB — LIPASE, BLOOD: Lipase: 34 U/L (ref 11–51)

## 2017-06-22 NOTE — ED Notes (Signed)
Per charge pt LWBS 

## 2017-06-22 NOTE — ED Notes (Signed)
Pt did not answer when called for room. RN checked waiting room and triage area.

## 2017-09-02 ENCOUNTER — Emergency Department (HOSPITAL_COMMUNITY): Payer: Medicaid Other

## 2017-09-02 ENCOUNTER — Encounter (HOSPITAL_COMMUNITY): Payer: Self-pay | Admitting: Emergency Medicine

## 2017-09-02 ENCOUNTER — Inpatient Hospital Stay (HOSPITAL_COMMUNITY)
Admission: EM | Admit: 2017-09-02 | Discharge: 2017-09-08 | DRG: 871 | Disposition: A | Discharge status: Home / Self Care | Payer: Medicaid Other | Attending: Family Medicine | Admitting: Family Medicine

## 2017-09-02 DIAGNOSIS — F1920 Other psychoactive substance dependence, uncomplicated: Secondary | ICD-10-CM | POA: Diagnosis present

## 2017-09-02 DIAGNOSIS — J9601 Acute respiratory failure with hypoxia: Secondary | ICD-10-CM | POA: Diagnosis present

## 2017-09-02 DIAGNOSIS — E871 Hypo-osmolality and hyponatremia: Secondary | ICD-10-CM | POA: Diagnosis present

## 2017-09-02 DIAGNOSIS — I700 Atherosclerosis of aorta: Secondary | ICD-10-CM | POA: Diagnosis present

## 2017-09-02 DIAGNOSIS — J181 Lobar pneumonia, unspecified organism: Secondary | ICD-10-CM | POA: Diagnosis present

## 2017-09-02 DIAGNOSIS — R599 Enlarged lymph nodes, unspecified: Secondary | ICD-10-CM | POA: Diagnosis present

## 2017-09-02 DIAGNOSIS — I959 Hypotension, unspecified: Secondary | ICD-10-CM

## 2017-09-02 DIAGNOSIS — B1920 Unspecified viral hepatitis C without hepatic coma: Secondary | ICD-10-CM | POA: Diagnosis present

## 2017-09-02 DIAGNOSIS — D696 Thrombocytopenia, unspecified: Secondary | ICD-10-CM | POA: Diagnosis present

## 2017-09-02 DIAGNOSIS — R6520 Severe sepsis without septic shock: Secondary | ICD-10-CM | POA: Diagnosis present

## 2017-09-02 DIAGNOSIS — I2510 Atherosclerotic heart disease of native coronary artery without angina pectoris: Secondary | ICD-10-CM | POA: Diagnosis present

## 2017-09-02 DIAGNOSIS — J810 Acute pulmonary edema: Secondary | ICD-10-CM | POA: Diagnosis present

## 2017-09-02 DIAGNOSIS — N179 Acute kidney failure, unspecified: Secondary | ICD-10-CM | POA: Diagnosis present

## 2017-09-02 DIAGNOSIS — R112 Nausea with vomiting, unspecified: Secondary | ICD-10-CM | POA: Diagnosis present

## 2017-09-02 DIAGNOSIS — A419 Sepsis, unspecified organism: Secondary | ICD-10-CM | POA: Diagnosis present

## 2017-09-02 DIAGNOSIS — E872 Acidosis: Secondary | ICD-10-CM | POA: Diagnosis present

## 2017-09-02 DIAGNOSIS — D638 Anemia in other chronic diseases classified elsewhere: Secondary | ICD-10-CM | POA: Diagnosis present

## 2017-09-02 DIAGNOSIS — F17210 Nicotine dependence, cigarettes, uncomplicated: Secondary | ICD-10-CM | POA: Diagnosis present

## 2017-09-02 DIAGNOSIS — J189 Pneumonia, unspecified organism: Secondary | ICD-10-CM | POA: Diagnosis present

## 2017-09-02 DIAGNOSIS — Z8614 Personal history of Methicillin resistant Staphylococcus aureus infection: Secondary | ICD-10-CM

## 2017-09-02 DIAGNOSIS — G4733 Obstructive sleep apnea (adult) (pediatric): Secondary | ICD-10-CM | POA: Diagnosis present

## 2017-09-02 DIAGNOSIS — J811 Chronic pulmonary edema: Secondary | ICD-10-CM | POA: Diagnosis present

## 2017-09-02 DIAGNOSIS — R4182 Altered mental status, unspecified: Secondary | ICD-10-CM | POA: Diagnosis present

## 2017-09-02 DIAGNOSIS — K760 Fatty (change of) liver, not elsewhere classified: Secondary | ICD-10-CM | POA: Diagnosis present

## 2017-09-02 DIAGNOSIS — Z716 Tobacco abuse counseling: Secondary | ICD-10-CM

## 2017-09-02 DIAGNOSIS — I509 Heart failure, unspecified: Secondary | ICD-10-CM | POA: Diagnosis not present

## 2017-09-02 DIAGNOSIS — G9341 Metabolic encephalopathy: Secondary | ICD-10-CM | POA: Diagnosis present

## 2017-09-02 DIAGNOSIS — R0600 Dyspnea, unspecified: Secondary | ICD-10-CM

## 2017-09-02 DIAGNOSIS — Z6841 Body Mass Index (BMI) 40.0 and over, adult: Secondary | ICD-10-CM | POA: Diagnosis not present

## 2017-09-02 LAB — CBC WITH DIFFERENTIAL/PLATELET
BASOS PCT: 0 %
Basophils Absolute: 0 10*3/uL (ref 0.0–0.1)
EOS ABS: 0 10*3/uL (ref 0.0–0.7)
Eosinophils Relative: 0 %
HEMATOCRIT: 33.9 % — AB (ref 36.0–46.0)
HEMOGLOBIN: 11.3 g/dL — AB (ref 12.0–15.0)
Lymphocytes Relative: 12 %
Lymphs Abs: 0.7 10*3/uL (ref 0.7–4.0)
MCH: 31.2 pg (ref 26.0–34.0)
MCHC: 33.3 g/dL (ref 30.0–36.0)
MCV: 93.6 fL (ref 78.0–100.0)
Monocytes Absolute: 0.6 10*3/uL (ref 0.1–1.0)
Monocytes Relative: 10 %
NEUTROS ABS: 4.6 10*3/uL (ref 1.7–7.7)
NEUTROS PCT: 78 %
Platelets: 92 10*3/uL — ABNORMAL LOW (ref 150–400)
RBC: 3.62 MIL/uL — AB (ref 3.87–5.11)
RDW: 13 % (ref 11.5–15.5)
WBC: 6 10*3/uL (ref 4.0–10.5)

## 2017-09-02 LAB — LIPASE, BLOOD: Lipase: 23 U/L (ref 11–51)

## 2017-09-02 LAB — URINALYSIS, ROUTINE W REFLEX MICROSCOPIC
Bilirubin Urine: NEGATIVE
Glucose, UA: NEGATIVE mg/dL
Hgb urine dipstick: NEGATIVE
Ketones, ur: NEGATIVE mg/dL
LEUKOCYTES UA: NEGATIVE
NITRITE: NEGATIVE
PROTEIN: NEGATIVE mg/dL
Specific Gravity, Urine: 1.005 (ref 1.005–1.030)
pH: 5 (ref 5.0–8.0)

## 2017-09-02 LAB — COMPREHENSIVE METABOLIC PANEL
ALK PHOS: 46 U/L (ref 38–126)
ALT: 34 U/L (ref 14–54)
ANION GAP: 7 (ref 5–15)
AST: 48 U/L — ABNORMAL HIGH (ref 15–41)
Albumin: 2.7 g/dL — ABNORMAL LOW (ref 3.5–5.0)
BUN: 20 mg/dL (ref 6–20)
CALCIUM: 8.1 mg/dL — AB (ref 8.9–10.3)
CO2: 20 mmol/L — ABNORMAL LOW (ref 22–32)
Chloride: 104 mmol/L (ref 101–111)
Creatinine, Ser: 1.45 mg/dL — ABNORMAL HIGH (ref 0.44–1.00)
GFR calc non Af Amer: 43 mL/min — ABNORMAL LOW (ref >60)
GFR, EST AFRICAN AMERICAN: 50 mL/min — AB (ref >60)
Glucose, Bld: 112 mg/dL — ABNORMAL HIGH (ref 65–99)
POTASSIUM: 3.4 mmol/L — AB (ref 3.5–5.1)
Sodium: 131 mmol/L — ABNORMAL LOW (ref 135–145)
TOTAL PROTEIN: 6.2 g/dL — AB (ref 6.5–8.1)
Total Bilirubin: 1.1 mg/dL (ref 0.3–1.2)

## 2017-09-02 LAB — I-STAT TROPONIN, ED: TROPONIN I, POC: 0.03 ng/mL (ref 0.00–0.08)

## 2017-09-02 LAB — I-STAT CG4 LACTIC ACID, ED
LACTIC ACID, VENOUS: 2.74 mmol/L — AB (ref 0.5–1.9)
LACTIC ACID, VENOUS: 2.36 mmol/L — AB (ref 0.5–1.9)
Lactic Acid, Venous: 2.06 mmol/L (ref 0.5–1.9)

## 2017-09-02 LAB — RAPID URINE DRUG SCREEN, HOSP PERFORMED
Amphetamines: NOT DETECTED
BENZODIAZEPINES: POSITIVE — AB
Barbiturates: NOT DETECTED
COCAINE: NOT DETECTED
OPIATES: NOT DETECTED
Tetrahydrocannabinol: NOT DETECTED

## 2017-09-02 LAB — SALICYLATE LEVEL: Salicylate Lvl: <7 mg/dL (ref 2.8–30.0)

## 2017-09-02 LAB — STREP PNEUMONIAE URINARY ANTIGEN: STREP PNEUMO URINARY ANTIGEN: NEGATIVE

## 2017-09-02 LAB — CBG MONITORING, ED: GLUCOSE-CAPILLARY: 101 mg/dL — AB (ref 65–99)

## 2017-09-02 LAB — ACETAMINOPHEN LEVEL

## 2017-09-02 LAB — I-STAT BETA HCG BLOOD, ED (MC, WL, AP ONLY)

## 2017-09-02 LAB — PREGNANCY, URINE: Preg Test, Ur: NEGATIVE

## 2017-09-02 LAB — INFLUENZA PANEL BY PCR (TYPE A & B)
INFLAPCR: NEGATIVE
Influenza B By PCR: NEGATIVE

## 2017-09-02 LAB — LACTIC ACID, PLASMA: Lactic Acid, Venous: 2.3 mmol/L (ref 0.5–1.9)

## 2017-09-02 MED ORDER — AZITHROMYCIN 500 MG IV SOLR
500.0000 mg | Freq: Once | INTRAVENOUS | Status: AC
  Administered 2017-09-02: 500 mg via INTRAVENOUS
  Filled 2017-09-02: qty 500

## 2017-09-02 MED ORDER — NALOXONE HCL 0.4 MG/ML IJ SOLN
0.2000 mg | Freq: Once | INTRAMUSCULAR | Status: AC
  Administered 2017-09-02: 0.2 mg via INTRAVENOUS
  Filled 2017-09-02: qty 1

## 2017-09-02 MED ORDER — SODIUM CHLORIDE 0.9 % IV BOLUS (SEPSIS)
1000.0000 mL | Freq: Once | INTRAVENOUS | Status: AC
  Administered 2017-09-02: 1000 mL via INTRAVENOUS

## 2017-09-02 MED ORDER — VANCOMYCIN HCL 10 G IV SOLR
2000.0000 mg | Freq: Once | INTRAVENOUS | Status: AC
  Administered 2017-09-02: 2000 mg via INTRAVENOUS
  Filled 2017-09-02: qty 2000

## 2017-09-02 MED ORDER — VANCOMYCIN HCL IN DEXTROSE 1-5 GM/200ML-% IV SOLN: 1000.0000 mg | INTRAVENOUS | Status: DC

## 2017-09-02 MED ORDER — NALOXONE HCL 0.4 MG/ML IJ SOLN
INTRAMUSCULAR | Status: AC
  Administered 2017-09-02: 0.4 mg/mL
  Filled 2017-09-02: qty 1

## 2017-09-02 MED ORDER — CEFTRIAXONE SODIUM 1 G IJ SOLR
1.0000 g | Freq: Once | INTRAMUSCULAR | Status: AC
  Administered 2017-09-02: 1 g via INTRAVENOUS
  Filled 2017-09-02: qty 10

## 2017-09-02 MED ORDER — ENOXAPARIN SODIUM 40 MG/0.4ML ~~LOC~~ SOLN
40.0000 mg | SUBCUTANEOUS | Status: DC
  Administered 2017-09-03 (×2): 40 mg via SUBCUTANEOUS
  Filled 2017-09-02 (×2): qty 0.4

## 2017-09-02 MED ORDER — ONDANSETRON HCL 4 MG PO TABS: 4.0000 mg | ORAL_TABLET | Freq: Four times a day (QID) | ORAL | Status: DC | PRN

## 2017-09-02 MED ORDER — DEXTROSE 5 % IV SOLN
500.0000 mg | INTRAVENOUS | Status: DC
  Administered 2017-09-03 – 2017-09-04 (×2): 500 mg via INTRAVENOUS
  Filled 2017-09-02 (×3): qty 500

## 2017-09-02 MED ORDER — ONDANSETRON HCL 4 MG/2ML IJ SOLN: 4.0000 mg | Freq: Four times a day (QID) | INTRAMUSCULAR | Status: DC | PRN

## 2017-09-02 MED ORDER — DEXTROSE 5 % IV SOLN: 1.0000 g | INTRAVENOUS | Status: DC

## 2017-09-02 MED ORDER — ACETAMINOPHEN 650 MG RE SUPP: 650.0000 mg | Freq: Four times a day (QID) | RECTAL | Status: DC | PRN

## 2017-09-02 MED ORDER — ACETAMINOPHEN 325 MG PO TABS: 650.0000 mg | ORAL_TABLET | Freq: Four times a day (QID) | ORAL | Status: DC | PRN

## 2017-09-02 MED ORDER — SODIUM CHLORIDE 0.9 % IV SOLN
INTRAVENOUS | Status: DC
  Administered 2017-09-03 – 2017-09-05 (×6): via INTRAVENOUS

## 2017-09-02 MED ORDER — POTASSIUM CHLORIDE 10 MEQ/100ML IV SOLN
10.0000 meq | INTRAVENOUS | Status: AC
  Administered 2017-09-03: 10 meq via INTRAVENOUS
  Filled 2017-09-02: qty 100

## 2017-09-02 NOTE — ED Notes (Addendum)
Pt's significant other Interior and spatial designer an at home pregnancy test that shows postive.  RN notified.

## 2017-09-02 NOTE — Progress Notes (Signed)
Pharmacy Antibiotic Note  Judy Robles is a 42 y.o. female presented to the ED on 09/02/2017 with suspected flu. CXR showed PNA. Patient may also be pregnant.  To start vancomycin for PNA.  - scr 1.45 (crcl~66)  Plan: - vancomycin 2000 mg IV x1, then 1000 mg IV q24h for est AUC 410 (goal 400-500) - f/u renal funct - f/u ur pregnancy test ________________________     Temp (24hrs), Avg:99.3 F (37.4 C), Min:99.3 F (37.4 C), Max:99.3 F (37.4 C)  Recent Labs  Lab 09/02/17 1708 09/02/17 1906 09/02/17 1953  WBC 6.0  --   --   CREATININE 1.45*  --   --   LATICACIDVEN  --  2.74* 2.06*    CrCl cannot be calculated (Unknown ideal weight.).    Allergies  Allergen Reactions  . Tylenol [Acetaminophen] Itching and Other (See Comments)    Welts  . Lisinopril Other (See Comments)    Shuts down kidneys     Thank you for allowing pharmacy to be a part of this patient's care.  Lucia Gaskins 09/02/2017 8:21 PM

## 2017-09-02 NOTE — ED Notes (Addendum)
Placed pt on 2L for low O2 sats 89-91% while sleeping. Provider notified

## 2017-09-02 NOTE — ED Notes (Signed)
Patient transported to X-ray 

## 2017-09-02 NOTE — ED Notes (Signed)
I Stat Lactic 2.36 RN and Provider notified

## 2017-09-02 NOTE — ED Provider Notes (Signed)
[facility]-EMERGENCY DEPT Provider Note   CSN: [id number] Arrival date & time: 09/02/17  1556     History   Chief Complaint Chief Complaint  Patient presents with  . Emesis  . Weakness    HPI Judy Robles is a 42 y.o. female with PMH/o polysubstance dependence, chronic renal insufficiency who presents for evaluation of nausea/vomiting and generalized weakness that has been ongoing for the last 24 hours.  Patient reports multiple episodes of nonbloody, nonbilious emesis.  She states that she has not been able to tolerate any p.o. since yesterday.  Patient states that she attempted to eat some soup today but states that she did not have an appetite.  The patient reports that this has made her generally weak. She denies any focal weakness.  She also reports that she has been having several episodes of nonbloody diarrhea.  She states that she had subjective fever chills yesterday but did not actually measure her temperature.  Patient states that today, she started having some chest pain that has been ongoing for the last several hours. She states that it is not worse with deep inspiration or exertion.  Patient has a history of substance abuse and.  She states the last time she took Suboxone was approximately 5 days ago.  Patient states that she did not want to take it anymore as she did not like the way feel.  Patient states that she took her Xanax today.  She denies any other medications were used.  Patient denies any cocaine, heroin, marijuana use.  She denies any alcohol use.  The history is provided by the patient.    Past Medical History:  Diagnosis Date  . Arthritis   . Hypertension   . MRSA (methicillin resistant Staphylococcus aureus)   . Obesity   . Sleep apnea     Patient Active Problem List   Diagnosis Date Noted  . Community acquired pneumonia of right lower lobe of lung (HCC) 09/02/2017  . Sepsis associated hypotension (HCC) 09/02/2017  . AKI (acute  kidney injury) (HCC) 09/02/2017  . Nausea and vomiting 09/02/2017  . Drug overdose 04/22/2017  . Chest pain 10/09/2016  . Pressure injury of skin 10/05/2016  . Polysubstance dependence including opioid type drug with complication, episodic abuse (HCC) 07/21/2016  . MDD (major depressive disorder), recurrent severe, without psychosis (HCC) 07/21/2016  . Chronic pain 07/03/2015  . Diarrhea 05/01/2014  . Essential hypertension, benign 04/17/2014    Past Surgical History:  Procedure Laterality Date  . I&D EXTREMITY Right 04/16/2014   Procedure: IRRIGATION AND DEBRIDEMENT EXTREMITY;  Surgeon: Sharma Covert, MD;  Location: [facility] OR;  Service: Orthopedics;  Laterality: Right;    OB History    No data available       Home Medications    Prior to Admission medications   Medication Sig Start Date End Date Taking? Authorizing Provider  ADDERALL XR 30 MG 24 hr capsule Take 30 mg by mouth daily. 11/14/16   [provider]  ALPRAZolam Prudy Feeler) 1 MG tablet Take 1 mg by mouth 3 (three) times daily. 09/30/16   [provider]  Buprenorphine HCl-Naloxone HCl (SUBOXONE) 8-2 MG FILM Place 1 Film under the tongue 2 (two) times daily. 10/07/16   Tyson Alias, MD  busPIRone (BUSPAR) 15 MG tablet Take 7.5 mg by mouth 2 (two) times daily. 11/24/16   [provider]  etodolac (LODINE) 300 MG capsule Take 1 capsule (300 mg total) by mouth every 8 (eight)  hours. 01/29/17   Linwood Dibbles, MD  gabapentin (NEURONTIN) 400 MG capsule Take 800 mg by mouth 3 (three) times daily.  11/24/16   [provider]  ibuprofen (ADVIL,MOTRIN) 800 MG tablet Take 800 mg by mouth 3 (three) times daily as needed for pain. 11/11/16   [provider]  ipratropium (ATROVENT HFA) 17 MCG/ACT inhaler Inhale 2 puffs into the lungs every 6 (six) hours as needed for wheezing. 12/19/16   Forest Becker, MD  PROAIR HFA 108 [phone] Base) MCG/ACT inhaler Inhale 2 puffs into the lungs every 6 (six) hours as  needed for wheezing or shortness of breath. 12/19/16   Forest Becker, MD  senna-docusate (SENOKOT-S) 8.6-50 MG tablet Take 2 tablets by mouth 2 (two) times daily. 10/08/16   Nyra Market, MD    Family History Family History  Problem Relation Age of Onset  . Throat cancer Mother   . Hypertension Father   . Stroke Father     Social History Social History   Tobacco Use  . Smoking status: Current Every Day Smoker    Packs/day: 1.00    Types: Cigarettes  . Smokeless tobacco: Never Used  . Tobacco comment: 1/2PPD  Substance Use Topics  . Alcohol use: No  . Drug use: Yes    Comment: "crack"     Allergies   Tylenol [acetaminophen] and Lisinopril   Review of Systems Review of Systems  Constitutional: Positive for fever (subjective).  Respiratory: Negative for cough and shortness of breath.   Cardiovascular: Positive for chest pain. Negative for leg swelling.  Gastrointestinal: Positive for diarrhea, nausea and vomiting. Negative for abdominal pain.  Genitourinary: Negative for dysuria and hematuria.  Neurological: Negative for headaches.     Physical Exam Updated Vital Signs BP 112/72   Pulse 85   Temp 99.3 F (37.4 C) (Oral)   Resp (!) 26   Ht 5\' 7"  (1.702 m)   Wt 117.9 kg (260 lb)   SpO2 100%   BMI 40.72 kg/m   Physical Exam  Constitutional: She is oriented to person, place, and time. She appears well-developed and well-nourished.  HENT:  Head: Normocephalic and atraumatic.  Mouth/Throat: Oropharynx is clear and moist and mucous membranes are normal.  Dried brown crust surrounding the mouth.  No evidence of blood.  Eyes: Conjunctivae, EOM and lids are normal. Pupils are equal, round, and reactive to light.  Neck: Full passive range of motion without pain.  Cardiovascular: Normal rate, regular rhythm and normal heart sounds. Exam reveals no gallop and no friction rub.  No murmur heard. Pulses:      Radial pulses are 2+ on the right side, and 2+ on the  left side.       Dorsalis pedis pulses are 2+ on the right side, and 3+ on the left side.  Pulmonary/Chest: Effort normal. Tachypnea noted. No respiratory distress. She has no wheezes. She has rales.  Diffuse rales noted throughout the lower lung fields.  Abdominal: Soft. Normal appearance. There is no tenderness. There is no rigidity and no guarding.  Musculoskeletal: Normal range of motion.  Neurological: She is alert and oriented to person, place, and time.  Follows commands, Moves all extremities  5/5 strength to BUE and BLE  Sensation intact throughout all major nerve distributions  Skin: Skin is warm and dry. Capillary refill takes less than 2 seconds.  Psychiatric: She has a normal mood and affect. Her speech is normal.  Nursing note and vitals reviewed.    ED Treatments /  Results  Labs (all labs ordered are listed, but only abnormal results are displayed) Labs Reviewed  COMPREHENSIVE METABOLIC PANEL - Abnormal; Notable for the following components:      Result Value   Sodium 131 (*)    Potassium 3.4 (*)    CO2 20 (*)    Glucose, Bld 112 (*)    Creatinine, Ser 1.45 (*)    Calcium 8.1 (*)    Total Protein 6.2 (*)    Albumin 2.7 (*)    AST 48 (*)    GFR calc non Af Amer 43 (*)    GFR calc Af Amer 50 (*)    All other components within normal limits  CBC WITH DIFFERENTIAL/PLATELET - Abnormal; Notable for the following components:   RBC 3.62 (*)    Hemoglobin 11.3 (*)    HCT 33.9 (*)    Platelets 92 (*)    All other components within normal limits  RAPID URINE DRUG SCREEN, HOSP PERFORMED - Abnormal; Notable for the following components:   Benzodiazepines POSITIVE (*)    All other components within normal limits  LACTIC ACID, PLASMA - Abnormal; Notable for the following components:   Lactic Acid, Venous 2.3 (*)    All other components within normal limits  BLOOD GAS, ARTERIAL - Abnormal; Notable for the following components:   pH, Arterial 7.321 (*)    Bicarbonate  17.1 (*)    Acid-base deficit 7.7 (*)    All other components within normal limits  ACETAMINOPHEN LEVEL - Abnormal; Notable for the following components:   Acetaminophen (Tylenol), Serum <10 (*)    All other components within normal limits  I-STAT CG4 LACTIC ACID, ED - Abnormal; Notable for the following components:   Lactic Acid, Venous 2.74 (*)    All other components within normal limits  I-STAT CG4 LACTIC ACID, ED - Abnormal; Notable for the following components:   Lactic Acid, Venous 2.06 (*)    All other components within normal limits  I-STAT CG4 LACTIC ACID, ED - Abnormal; Notable for the following components:   Lactic Acid, Venous 2.36 (*)    All other components within normal limits  CBG MONITORING, ED - Abnormal; Notable for the following components:   Glucose-Capillary 101 (*)    All other components within normal limits  CULTURE, BLOOD (ROUTINE X 2)  CULTURE, BLOOD (ROUTINE X 2)  CULTURE, EXPECTORATED SPUTUM-ASSESSMENT  GRAM STAIN  LIPASE, BLOOD  URINALYSIS, ROUTINE W REFLEX MICROSCOPIC  PREGNANCY, URINE  INFLUENZA PANEL BY PCR (TYPE A & B)  SALICYLATE LEVEL  CBC  BASIC METABOLIC PANEL  HIV ANTIBODY (ROUTINE TESTING)  STREP PNEUMONIAE URINARY ANTIGEN  LACTIC ACID, PLASMA  I-STAT TROPONIN, ED  I-STAT BETA HCG BLOOD, ED ([facility], [facility], [facility] ONLY)  I-STAT CG4 LACTIC ACID, ED    EKG  EKG Interpretation None       Radiology Dg Chest 2 View  Result Date: 09/02/2017 CLINICAL DATA:  Upper respiratory symptoms suspect flu. EXAM: CHEST  2 VIEW COMPARISON:  June 20, 2017 FINDINGS: The mediastinal contour is normal. The heart size is enlarged. There is pulmonary edema. There is consolidation of the right lung base. The bony structures are unremarkable. IMPRESSION: Right lung base pneumonia. Pulmonary edema. Electronically Signed   By: Sherian Rein M.D.   On: 09/02/2017 17:37    Procedures Procedures (including critical care time)  Medications Ordered in  ED Medications  ondansetron (ZOFRAN) tablet 4 mg (not administered)    Or  ondansetron (ZOFRAN) injection 4  mg (not administered)  enoxaparin (LOVENOX) injection 40 mg (not administered)  0.9 %  sodium chloride infusion ( Intravenous Not Given 09/02/17 2015)  potassium chloride 10 mEq in 100 mL IVPB (not administered)  cefTRIAXone (ROCEPHIN) 1 g in dextrose 5 % 50 mL IVPB (not administered)  azithromycin (ZITHROMAX) 500 mg in dextrose 5 % 250 mL IVPB (not administered)  vancomycin (VANCOCIN) IVPB 1000 mg/200 mL premix (not administered)  sodium chloride 0.9 % bolus 1,000 mL (0 mLs Intravenous Stopped 09/02/17 2145)  sodium chloride 0.9 % bolus 1,000 mL (0 mLs Intravenous Stopped 09/02/17 2341)  cefTRIAXone (ROCEPHIN) 1 g in dextrose 5 % 50 mL IVPB (0 g Intravenous Stopped 09/02/17 1929)  azithromycin (ZITHROMAX) 500 mg in dextrose 5 % 250 mL IVPB (0 mg Intravenous Stopped 09/02/17 2101)  sodium chloride 0.9 % bolus 1,000 mL (0 mLs Intravenous Stopped 09/02/17 2101)    And  sodium chloride 0.9 % bolus 1,000 mL (0 mLs Intravenous Stopped 09/02/17 2101)  vancomycin (VANCOCIN) 2,000 mg in sodium chloride 0.9 % 500 mL IVPB (2,000 mg Intravenous New Bag/Given 09/02/17 2146)  naloxone [facility]) injection 0.2 mg (0.2 mg Intravenous Given 09/02/17 2130)  naloxone (NARCAN) 0.4 MG/ML injection (0.4 mg/mL  Given 09/02/17 2231)     Initial Impression / Assessment and Plan / ED Course  I have reviewed the triage vital signs and the nursing notes.  Pertinent labs & imaging results that were available during my care of the patient were reviewed by me and considered in my medical decision making (see chart for details).     42 y.o. F past medical history of polysubstance abuse, chronic renal insufficiency who presents for evaluation of nausea/vomiting and generalized weakness that began about 24 hours ago.  Patient reports that she has not been able to tolerate p.o. since yesterday.  Patient reports that she  started having some chest pain and difficulty breathing today.  Patient reports subjective fever and chills.  She does not take any medications for the symptoms.  On ED arrival, patient is afebrile.  She is slightly hypertensive and oxygen sats are 93% on room air.  No evidence of tachycardia.  On exam, patient is alert and oriented x3.  Patient also reports that she stopped taking Suboxone approximately 5 days ago.  Consider acute infectious etiology versus ACS etiology versus withdrawal.  Do not suspect PE at this time.  Plan to check basic labs. IVF given for fluid resuscitation  Labs and imaging reviewed.  CBC shows anemia that is consistent with previous.  CMP shows hypokalemia at 3.4.  CO2 is 20. Creatinine is 1.45 which is a jump from her baseline.  AST is elevated but that is consistent with previous.  Troponin negative.  Lipase negative.  Chest x-ray reviewed.  Shows consolidation of the right lower lung base consistent with pneumonia. There is also some mention of pulmonary edema. On evaluation, patient does have some rales to the lower lung fields but none otherwise. Urine pending.     Lactic acid reviewed and is elevated.  Given patient's hypotension and elevated lactic acid, code sepsis initiated.  IV antibiotic started for treatment of community-acquired pneumonia.  Given concerns, will consult for admission.  Discussed with hospitalist.  Will plan to admit.  On Hospitalist evaluation, patient had become more somnolent.  Additionally, her respiratory rate increased to 40.  Patient is still hypotensive.  We will add additional urine drug screen, salicylate and acetaminophen level.  We will also get an ABG.  On my initial evaluation, patient was alert and oriented x3 and was talking without any difficulty.  Patient is now somnolent but arousable to verbal stimuli.  Urine drug screen is positive for benzos but no other substance.  UA is negative for any acute signs of infection.  ABG shows  bicarb of 17, pH of 7.3.  Discussed with hospitalist.  Will plan to admit.  Patient responsive to small dose of Narcan.  Patient did have a visitor that was in the room who left shortly before patient became somnolent.  Unsure if patient was given any other substance.  Discussed with hospitalist.  Will continue admission at this time.  Final Clinical Impressions(s) / ED Diagnoses   Final diagnoses:  Community acquired pneumonia of right lower lobe of lung (HCC)  Altered mental status, unspecified altered mental status type    ED Discharge Orders    None       Rosana Hoes 09/02/17 2349    Cathren Laine, MD 09/03/17 [phone]

## 2017-09-02 NOTE — H&P (Signed)
History and Physical    Judy Robles IRJ:[id number] DOB: [birth year] DOA: 09/02/2017  PCP: Jearld Lesch, MD  Patient coming from: Home  I have personally briefly reviewed patient's old medical records in [facility]  Chief Complaint: Vomiting, weakness  HPI: Judy Robles is a 42 y.o. female with medical history significant of polysubstance abuse.  Patient presents to the ED with c/o NBNB emesis episodes.  Onset 24 hours ago.  Persistent, severe.  Cant keep anything down.  Developed CP today.  Subjective fever and chills yesterday.  H/o sub abuse, last suboxone 5 days ago.   ED Course: RLL PNA and pulm edema called on CXR.  Initial BP 70s systolic, improved to 100 systolic after 2.5L.  Given rocephin and azithromycin.  UA neg.   Review of Systems: As per HPI otherwise 10 point review of systems negative.   Past Medical History:  Diagnosis Date  . Arthritis   . Hypertension   . MRSA (methicillin resistant Staphylococcus aureus)   . Obesity   . Sleep apnea     Past Surgical History:  Procedure Laterality Date  . I&D EXTREMITY Right 04/16/2014   Procedure: IRRIGATION AND DEBRIDEMENT EXTREMITY;  Surgeon: Sharma Covert, MD;  Location: [facility] OR;  Service: Orthopedics;  Laterality: Right;     reports that she has been smoking cigarettes.  She has been smoking about 1.00 pack per day. she has never used smokeless tobacco. She reports that she uses drugs. She reports that she does not drink alcohol.  Allergies  Allergen Reactions  . Tylenol [Acetaminophen] Itching and Other (See Comments)    Welts  . Lisinopril Other (See Comments)    Shuts down kidneys    Family History  Problem Relation Age of Onset  . Throat cancer Mother   . Hypertension Father   . Stroke Father      Prior to Admission medications   Medication Sig Start Date End Date Taking? Authorizing Provider  ADDERALL XR 30 MG 24 hr capsule Take 30 mg by mouth daily. 11/14/16   [provider]  ALPRAZolam Prudy Feeler) 1 MG tablet Take 1 mg by mouth 3 (three) times daily. 09/30/16   [provider]  Buprenorphine HCl-Naloxone HCl (SUBOXONE) 8-2 MG FILM Place 1 Film under the tongue 2 (two) times daily. 10/07/16   Tyson Alias, MD  busPIRone (BUSPAR) 15 MG tablet Take 7.5 mg by mouth 2 (two) times daily. 11/24/16   [provider]  etodolac (LODINE) 300 MG capsule Take 1 capsule (300 mg total) by mouth every 8 (eight) hours. 01/29/17   Linwood Dibbles, MD  gabapentin (NEURONTIN) 400 MG capsule Take 800 mg by mouth 3 (three) times daily.  11/24/16   [provider]  ibuprofen (ADVIL,MOTRIN) 800 MG tablet Take 800 mg by mouth 3 (three) times daily as needed for pain. 11/11/16   [provider]  ipratropium (ATROVENT HFA) 17 MCG/ACT inhaler Inhale 2 puffs into the lungs every 6 (six) hours as needed for wheezing. 12/19/16   Forest Becker, MD  PROAIR HFA 108 [phone] Base) MCG/ACT inhaler Inhale 2 puffs into the lungs every 6 (six) hours as needed for wheezing or shortness of breath. 12/19/16   Forest Becker, MD  senna-docusate (SENOKOT-S) 8.6-50 MG tablet Take 2 tablets by mouth 2 (two) times daily. 10/08/16   Nyra Market, MD    Physical Exam: Vitals:   09/02/17 2002 09/02/17 2037 09/02/17 2040 09/02/17 2103  BP: (!) 90/56  Marland Kitchen)  97/50 (!) 103/55  Pulse: 90  89 88  Resp: (!) 31  (!) 32 (!) 33  Temp:      TempSrc:      SpO2: 94%  97% 98%  Weight:  117.9 kg (260 lb)    Height:  5\' 7"  (1.702 m)      Constitutional: Lethargic Eyes: PERRL, lids and conjunctivae normal ENMT: Mucous membranes are moist. Posterior pharynx clear of any exudate or lesions.Normal dentition.  Neck: normal, supple, no masses, no thyromegaly Respiratory: Rales B lower lungs.  Paradoxical abd wall motion, RR 35-40. Cardiovascular: Regular rate and rhythm, no murmurs / rubs / gallops. No extremity edema. 2+ pedal pulses. No carotid bruits.  Abdomen: no tenderness, no masses palpated.  No hepatosplenomegaly. Bowel sounds positive.  Musculoskeletal: no clubbing / cyanosis. No joint deformity upper and lower extremities. Good ROM, no contractures. Normal muscle tone.  Skin: no rashes, lesions, ulcers. No induration Neurologic: MAE, difficult to arrouse Psychiatric: Normal judgment and insight. Alert and oriented x 3. Normal mood.    Labs on Admission: I have personally reviewed following labs and imaging studies  CBC: Recent Labs  Lab 09/02/17 1708  WBC 6.0  NEUTROABS 4.6  HGB 11.3*  HCT 33.9*  MCV 93.6  PLT 92*   Basic Metabolic Panel: Recent Labs  Lab 09/02/17 1708  NA 131*  K 3.4*  CL 104  CO2 20*  GLUCOSE 112*  BUN 20  CREATININE 1.45*  CALCIUM 8.1*   GFR: Estimated Creatinine Clearance: 66.4 mL/min (A) (by C-G formula based on SCr of 1.45 mg/dL (H)). Liver Function Tests: Recent Labs  Lab 09/02/17 1708  AST 48*  ALT 34  ALKPHOS 46  BILITOT 1.1  PROT 6.2*  ALBUMIN 2.7*   Recent Labs  Lab 09/02/17 1708  LIPASE 23   No results for input(s): AMMONIA in the last 168 hours. Coagulation Profile: No results for input(s): INR, PROTIME in the last 168 hours. Cardiac Enzymes: No results for input(s): CKTOTAL, CKMB, CKMBINDEX, TROPONINI in the last 168 hours. BNP (last 3 results) No results for input(s): PROBNP in the last 8760 hours. HbA1C: No results for input(s): HGBA1C in the last 72 hours. CBG: No results for input(s): GLUCAP in the last 168 hours. Lipid Profile: No results for input(s): CHOL, HDL, LDLCALC, TRIG, CHOLHDL, LDLDIRECT in the last 72 hours. Thyroid Function Tests: No results for input(s): TSH, T4TOTAL, FREET4, T3FREE, THYROIDAB in the last 72 hours. Anemia Panel: No results for input(s): VITAMINB12, FOLATE, FERRITIN, TIBC, IRON, RETICCTPCT in the last 72 hours. Urine analysis:    Component Value Date/Time   COLORURINE YELLOW 09/02/2017 2034   APPEARANCEUR CLEAR 09/02/2017 2034   LABSPEC 1.005 09/02/2017 2034    PHURINE 5.0 09/02/2017 2034   GLUCOSEU NEGATIVE 09/02/2017 2034   HGBUR NEGATIVE 09/02/2017 2034   BILIRUBINUR NEGATIVE 09/02/2017 2034   KETONESUR NEGATIVE 09/02/2017 2034   PROTEINUR NEGATIVE 09/02/2017 2034   UROBILINOGEN 0.2 07/03/2015 0618   NITRITE NEGATIVE 09/02/2017 2034   LEUKOCYTESUR NEGATIVE 09/02/2017 2034    Radiological Exams on Admission: Dg Chest 2 View  Result Date: 09/02/2017 CLINICAL DATA:  Upper respiratory symptoms suspect flu. EXAM: CHEST  2 VIEW COMPARISON:  June 20, 2017 FINDINGS: The mediastinal contour is normal. The heart size is enlarged. There is pulmonary edema. There is consolidation of the right lung base. The bony structures are unremarkable. IMPRESSION: Right lung base pneumonia. Pulmonary edema. Electronically Signed   By: Sherian Rein M.D.   On: 09/02/2017 17:37  EKG: Independently reviewed.  Assessment/Plan Principal Problem:   Community acquired pneumonia of right lower lobe of lung (HCC) Active Problems:   Polysubstance dependence including opioid type drug with complication, episodic abuse (HCC)   Sepsis associated hypotension (HCC)   AKI (acute kidney injury) (HCC)   Nausea and vomiting    1. CAP of RLL - causing sepsis, hypotension 1. IVF: 2.5L bolus 2. ABx: rocephin, azithromycin, will add vanc due to SDU/ICU status with CAP, and h/o MRSA+ 3. Flu PCR pending 4. ABG pending given respiratory status.  High risk for needing intubation.  Have discussed this with Dr. Denton Lank. 5. Cultures pending. 2. AKI - secondary to hypotension 3. N/V - 1. Zofran 2. IVF as above  DVT prophylaxis: Lovenox Code Status: Full Family Communication: No family in room Disposition Plan: Home after admit Consults called: None Admission status: Admit to inpatient - inpatient status for severe sepsis with hypotension   Hillary Bow. DO [facility] Pager [phone]  If 7AM-7PM, please contact day team taking care of  patient [email or web address] Password [facility]  09/02/2017, 9:04 PM

## 2017-09-02 NOTE — ED Notes (Signed)
Patient is obtunded with soft BP's 70's-90's. Patient has RR 27. Dr. Julian Reil and Dr. Denton Lank aware. Dr. Julian Reil in room evaluating patient.

## 2017-09-02 NOTE — ED Triage Notes (Signed)
Pt is presented by EMS, bedside reports states that pt suspects she has a flu adding that she came in close contact with people who have been recently diagnosed with a flu. She is complaining of flu-like symptoms of fever, weakness, emesis and generalized weakness.

## 2017-09-02 NOTE — ED Notes (Signed)
Bed: WA18 Expected date:  Expected time:  Means of arrival:  Comments: Resus A 

## 2017-09-03 ENCOUNTER — Inpatient Hospital Stay (HOSPITAL_COMMUNITY): Payer: Medicaid Other

## 2017-09-03 ENCOUNTER — Other Ambulatory Visit: Payer: Self-pay

## 2017-09-03 DIAGNOSIS — I509 Heart failure, unspecified: Secondary | ICD-10-CM

## 2017-09-03 LAB — BASIC METABOLIC PANEL
Anion gap: 5 (ref 5–15)
BUN: 20 mg/dL (ref 6–20)
CALCIUM: 7.6 mg/dL — AB (ref 8.9–10.3)
CO2: 21 mmol/L — ABNORMAL LOW (ref 22–32)
CREATININE: 1.09 mg/dL — AB (ref 0.44–1.00)
Chloride: 112 mmol/L — ABNORMAL HIGH (ref 101–111)
GFR calc Af Amer: >60 mL/min (ref >60)
GLUCOSE: 94 mg/dL (ref 65–99)
Potassium: 4.1 mmol/L (ref 3.5–5.1)
Sodium: 138 mmol/L (ref 135–145)

## 2017-09-03 LAB — CBC
HCT: 32.2 % — ABNORMAL LOW (ref 36.0–46.0)
Hemoglobin: 10.9 g/dL — ABNORMAL LOW (ref 12.0–15.0)
MCH: 31.8 pg (ref 26.0–34.0)
MCHC: 33.9 g/dL (ref 30.0–36.0)
MCV: 93.9 fL (ref 78.0–100.0)
PLATELETS: 148 10*3/uL — AB (ref 150–400)
RBC: 3.43 MIL/uL — ABNORMAL LOW (ref 3.87–5.11)
RDW: 13.4 % (ref 11.5–15.5)
WBC: 7.8 10*3/uL (ref 4.0–10.5)

## 2017-09-03 LAB — I-STAT CG4 LACTIC ACID, ED
LACTIC ACID, VENOUS: 1.55 mmol/L (ref 0.5–1.9)
Lactic Acid, Venous: 1.94 mmol/L — ABNORMAL HIGH (ref 0.5–1.9)

## 2017-09-03 LAB — ECHOCARDIOGRAM COMPLETE
Ao-asc: 26 cm
CHL CUP MV DEC (S): 246
CHL CUP TV REG PEAK VELOCITY: 287 cm/s
EWDT: 246 ms
FS: 37 % (ref 28–44)
HEIGHTINCHES: 67 in
IV/PV OW: 0.8
LA ID, A-P, ES: 44 mm
LA vol: 73.7 mL
LADIAMINDEX: 1.82 cm/m2
LAVOLA4C: 67.7 mL
LAVOLIN: 30.5 mL/m2
LEFT ATRIUM END SYS DIAM: 44 mm
LV PW d: 12 mm — AB (ref 0.6–1.1)
LVOT area: 3.14 cm2
LVOT diameter: 20 mm
MV pk E vel: 133 m/s
MVPG: 7 mmHg
MVPKAVEL: 110 m/s
RV TAPSE: 24.3 mm
TR max vel: 287 cm/s
WEIGHTICAEL: 4160 [oz_av]

## 2017-09-03 LAB — RESPIRATORY PANEL BY PCR
ADENOVIRUS-RVPPCR: NOT DETECTED
BORDETELLA PERTUSSIS-RVPCR: NOT DETECTED
CORONAVIRUS 229E-RVPPCR: NOT DETECTED
CORONAVIRUS HKU1-RVPPCR: NOT DETECTED
CORONAVIRUS NL63-RVPPCR: NOT DETECTED
Chlamydophila pneumoniae: NOT DETECTED
Coronavirus OC43: NOT DETECTED
Influenza A: NOT DETECTED
Influenza B: NOT DETECTED
METAPNEUMOVIRUS-RVPPCR: NOT DETECTED
Mycoplasma pneumoniae: NOT DETECTED
PARAINFLUENZA VIRUS 2-RVPPCR: NOT DETECTED
PARAINFLUENZA VIRUS 3-RVPPCR: NOT DETECTED
Parainfluenza Virus 1: NOT DETECTED
Parainfluenza Virus 4: NOT DETECTED
Respiratory Syncytial Virus: NOT DETECTED
Rhinovirus / Enterovirus: NOT DETECTED

## 2017-09-03 LAB — BLOOD GAS, ARTERIAL
Acid-base deficit: 4.3 mmol/L — ABNORMAL HIGH (ref 0.0–2.0)
Bicarbonate: 19.1 mmol/L — ABNORMAL LOW (ref 20.0–28.0)
Drawn by: 33147
O2 CONTENT: 2 L/min
O2 SAT: 94.3 %
PATIENT TEMPERATURE: 37
pCO2 arterial: 31.1 mmHg — ABNORMAL LOW (ref 32.0–48.0)
pH, Arterial: 7.406 (ref 7.350–7.450)
pO2, Arterial: 70.3 mmHg — ABNORMAL LOW (ref 83.0–108.0)

## 2017-09-03 LAB — TROPONIN I: Troponin I: <0.03 ng/mL (ref <0.03)

## 2017-09-03 LAB — MRSA PCR SCREENING: MRSA BY PCR: NEGATIVE

## 2017-09-03 LAB — MAGNESIUM: MAGNESIUM: 1.5 mg/dL — AB (ref 1.7–2.4)

## 2017-09-03 LAB — AMMONIA: Ammonia: 25 umol/L (ref 9–35)

## 2017-09-03 LAB — HIV ANTIBODY (ROUTINE TESTING W REFLEX): HIV Screen 4th Generation wRfx: NONREACTIVE

## 2017-09-03 MED ORDER — IPRATROPIUM-ALBUTEROL 0.5-2.5 (3) MG/3ML IN SOLN
3.0000 mL | Freq: Three times a day (TID) | RESPIRATORY_TRACT | Status: DC
  Administered 2017-09-03 – 2017-09-06 (×8): 3 mL via RESPIRATORY_TRACT
  Filled 2017-09-03 (×9): qty 3

## 2017-09-03 MED ORDER — ORAL CARE MOUTH RINSE
15.0000 mL | Freq: Two times a day (BID) | OROMUCOSAL | Status: DC
  Administered 2017-09-03 – 2017-09-08 (×9): 15 mL via OROMUCOSAL

## 2017-09-03 MED ORDER — DEXTROSE 5 % IV SOLN
2.0000 g | INTRAVENOUS | Status: DC
  Administered 2017-09-03 – 2017-09-05 (×3): 2 g via INTRAVENOUS
  Filled 2017-09-03 (×4): qty 2

## 2017-09-03 MED ORDER — NALOXONE HCL 2 MG/2ML IJ SOSY
0.2500 mg/h | PREFILLED_SYRINGE | INTRAVENOUS | Status: DC
  Administered 2017-09-03: 0.25 mg/h via INTRAVENOUS
  Filled 2017-09-03: qty 4

## 2017-09-03 MED ORDER — VANCOMYCIN HCL IN DEXTROSE 750-5 MG/150ML-% IV SOLN
750.0000 mg | Freq: Two times a day (BID) | INTRAVENOUS | Status: DC
  Administered 2017-09-03: 750 mg via INTRAVENOUS
  Filled 2017-09-03 (×2): qty 150

## 2017-09-03 MED ORDER — NALOXONE HCL 0.4 MG/ML IJ SOLN
0.4000 mg | INTRAMUSCULAR | Status: DC | PRN
Start: 2017-09-03 — End: 2017-09-06
  Administered 2017-09-03: 0.4 mg via INTRAVENOUS
  Filled 2017-09-03: qty 1

## 2017-09-03 NOTE — H&P (Addendum)
Mental status improved (AAOx4) after 0.4mg  narcan before slowly worsening again over next hour.  Got 2nd round of 0.2mg  narcan.  Mental status not like this on presentation apparently.  Is pretty clear however that despite UDS she does have some form of opiate on board that is markedly effecting her mental status.  If narcan wares off again then will need narcan gtt.  Update: RN called and said patient becoming hard to wake up again and pressures becoming soft again.  Will try Narcan 0.4 (which worked last time) and gtt this time.

## 2017-09-03 NOTE — Progress Notes (Signed)
Pharmacy Antibiotic Note  Judy Robles is a 42 y.o. female presented to the ED on 09/02/2017 with suspected flu, CXR showed PNA. Empiric ceftriaxone and azithromycin were started and pharmacy was consulted to start vancomycin.  Patient received vancomycin 2000 mg IV loading dose on 1/12 at 2146.  Today, 09/03/2017: SCr improved No new culture data  Plan: - Increase vancomycin from 1000 mg q24h to 750 mg q12h for est AUC 472 (goal 400-500) - Increased ceftriaxone to 2 grams IV q24h for weight > 100kg - Follow renal function, cultures, clinical course ________________________  Height: 5\' 7"  (170.2 cm) Weight: 260 lb (117.9 kg) IBW/kg (Calculated) : 61.6  Temp (24hrs), Avg:99.2 F (37.3 C), Min:99.1 F (37.3 C), Max:99.3 F (37.4 C)  Recent Labs  Lab 09/02/17 1708  09/02/17 1953 09/02/17 2027 09/02/17 2143 09/03/17 0500 09/03/17 0548 09/03/17 0752  WBC 6.0  --   --   --   --  7.8  --   --   CREATININE 1.45*  --   --   --   --  1.09*  --   --   LATICACIDVEN  --    < > 2.06* 2.3* 2.36*  --  1.94* 1.55   < > = values in this interval not displayed.    Estimated Creatinine Clearance: 88.4 mL/min (A) (by C-G formula based on SCr of 1.09 mg/dL (H)).    Allergies  Allergen Reactions  . Tylenol [Acetaminophen] Itching and Other (See Comments)    Welts  . Lisinopril Other (See Comments)    Shuts down kidneys   Antimicrobials this admission:  1/12 vancomycin >>  1/12 ceftriaxone >> 1/12 azithromycin >>  Dose adjustments this admission:  1/13 vancomycin increased to 750 mg IV q12h (SCr improved, new est AUC 472) 1/13 ceftriaxone increased to 2 g IV q24h for weight > 100 kg  Microbiology results:  1/12 BCx: collected   Thank you for allowing pharmacy to be a part of this patient's care.  3/12, PharmD, BCPS Pager: [phone] 09/03/2017  10:31 AM

## 2017-09-03 NOTE — Plan of Care (Signed)
  Pain Managment: General experience of comfort will improve 09/03/2017 1958 - Progressing by Antionette Char, RN

## 2017-09-03 NOTE — ED Notes (Signed)
ED TO INPATIENT HANDOFF REPORT  Name/Age/Gender Judy Robles 42 y.o. female  Code Status    Code Status Orders  (From admission, onward)        Start     Ordered   09/02/17 1958  Full code  Continuous     09/02/17 2001    Code Status History    Date Active Date Inactive Code Status Order ID Comments User Context   04/22/2017 08:34 04/24/2017 17:06 Full Code [id number]  Elwin Mocha, MD ED   10/09/2016 10:48 10/11/2016 20:21 Full Code [id number]  Riccardo Dubin, MD Inpatient   10/03/2016 14:47 10/08/2016 22:52 Full Code [id number]  Germain Osgood, PA-C ED   07/21/2016 22:37 07/27/2016 19:26 Full Code [id number]  Ethelene Hal, NP Inpatient   07/03/2015 01:38 07/04/2015 13:33 Full Code [id number]  Rise Patience, MD Inpatient   04/16/2014 00:09 04/19/2014 13:33 Full Code [id number]  Clinton Gallant, MD Inpatient      Home/SNF/Other Home  Chief Complaint flu  Level of Care/Admitting Diagnosis ED Disposition    ED Disposition Condition [place]: [place] [[id number]]  Level of Care: Stepdown [14]  Admit to SDU based on following criteria: Hemodynamic compromise or significant risk of instability:  Patient requiring short term acute titration and management of vasoactive drips, and invasive monitoring (i.e., CVP and Arterial line).  Diagnosis: Community acquired pneumonia of right lower lobe of lung [facility]) [[id number]]  Admitting Physician: Doreatha Massed  Attending Physician: Etta Quill [phone]  Estimated length of stay: past midnight tomorrow  Certification:: I certify this patient will need inpatient services for at least 2 midnights  PT Class (Do Not Modify): Inpatient [101]  PT Acc Code (Do Not Modify): Private [1]       Medical History Past Medical History:  Diagnosis Date  . Arthritis   . Hypertension   . MRSA (methicillin resistant Staphylococcus aureus)   . Obesity   . Sleep apnea      Allergies Allergies  Allergen Reactions  . Tylenol [Acetaminophen] Itching and Other (See Comments)    Welts  . Lisinopril Other (See Comments)    Shuts down kidneys    IV Location/Drains/Wounds Patient Lines/Drains/Airways Status   Active Line/Drains/Airways    Name:   Placement date:   Placement time:   Site:   Days:   Peripheral IV 09/02/17 Right;Upper Arm   09/02/17    1712    Arm   1   Peripheral IV 09/03/17 Left Forearm   09/03/17    0745    Forearm   less than 1   Incision (Closed) 04/16/14 Hand Right   04/16/14    1910     1236   Pressure Injury 10/03/16 Stage III -  Full thickness tissue loss. Subcutaneous fat may be visible but bone, tendon or muscle are NOT exposed. LEFT HEEL stage 3 pressure ulcer   10/03/16    1700     335   Pressure Injury 10/03/16 Stage III -  Full thickness tissue loss. Subcutaneous fat may be visible but bone, tendon or muscle are NOT exposed. RIGHT HEEL stage 3 pressure ulcer   10/03/16    1700     335   Wound / Incision (Open or Dehisced) 04/16/14 Other (Comment) Hand Right dime size scab outer aspect of the posterior hand   04/16/14    0030    Hand   1236  Labs/Imaging Results for orders placed or performed during the hospital encounter of 09/02/17 (from the past 48 hour(s))  Comprehensive metabolic panel     Status: Abnormal   Collection Time: 09/02/17  5:08 PM  Result Value Ref Range   Sodium 131 (L) 135 - 145 mmol/L   Potassium 3.4 (L) 3.5 - 5.1 mmol/L   Chloride 104 101 - 111 mmol/L   CO2 20 (L) 22 - 32 mmol/L   Glucose, Bld 112 (H) 65 - 99 mg/dL   BUN 20 6 - 20 mg/dL   Creatinine, Ser 1.45 (H) 0.44 - 1.00 mg/dL   Calcium 8.1 (L) 8.9 - 10.3 mg/dL   Total Protein 6.2 (L) 6.5 - 8.1 g/dL   Albumin 2.7 (L) 3.5 - 5.0 g/dL   AST 48 (H) 15 - 41 U/L   ALT 34 14 - 54 U/L   Alkaline Phosphatase 46 38 - 126 U/L   Total Bilirubin 1.1 0.3 - 1.2 mg/dL   GFR calc non Af Amer 43 (L) >60 mL/min   GFR calc Af Amer 50 (L) >60 mL/min     Comment: (NOTE) The eGFR has been calculated using the CKD EPI equation. This calculation has not been validated in all clinical situations. eGFR's persistently <60 mL/min signify possible Chronic Kidney Disease.    Anion gap 7 5 - 15  Lipase, blood     Status: None   Collection Time: 09/02/17  5:08 PM  Result Value Ref Range   Lipase 23 11 - 51 U/L  CBC with Differential     Status: Abnormal   Collection Time: 09/02/17  5:08 PM  Result Value Ref Range   WBC 6.0 4.0 - 10.5 K/uL   RBC 3.62 (L) 3.87 - 5.11 MIL/uL   Hemoglobin 11.3 (L) 12.0 - 15.0 g/dL   HCT 33.9 (L) 36.0 - 46.0 %   MCV 93.6 78.0 - 100.0 fL   MCH 31.2 26.0 - 34.0 pg   MCHC 33.3 30.0 - 36.0 g/dL   RDW 13.0 11.5 - 15.5 %   Platelets 92 (L) 150 - 400 K/uL    Comment: REPEATED TO VERIFY SPECIMEN CHECKED FOR CLOTS PLATELET COUNT CONFIRMED BY SMEAR    Neutrophils Relative % 78 %   Neutro Abs 4.6 1.7 - 7.7 K/uL   Lymphocytes Relative 12 %   Lymphs Abs 0.7 0.7 - 4.0 K/uL   Monocytes Relative 10 %   Monocytes Absolute 0.6 0.1 - 1.0 K/uL   Eosinophils Relative 0 %   Eosinophils Absolute 0.0 0.0 - 0.7 K/uL   Basophils Relative 0 %   Basophils Absolute 0.0 0.0 - 0.1 K/uL  I-Stat Troponin, ED (not at [facility])     Status: None   Collection Time: 09/02/17  5:19 PM  Result Value Ref Range   Troponin i, poc 0.03 0.00 - 0.08 ng/mL   Comment 3            Comment: Due to the release kinetics of cTnI, a negative result within the first hours of the onset of symptoms does not rule out myocardial infarction with certainty. If myocardial infarction is still suspected, repeat the test at appropriate intervals.   I-Stat CG4 Lactic Acid, ED     Status: Abnormal   Collection Time: 09/02/17  7:06 PM  Result Value Ref Range   Lactic Acid, Venous 2.74 (HH) 0.5 - 1.9 mmol/L   Comment NOTIFIED PHYSICIAN   I-Stat CG4 Lactic Acid, ED  (not at  [facility])  Status: Abnormal   Collection Time: 09/02/17  7:53 PM  Result Value Ref Range    Lactic Acid, Venous 2.06 (HH) 0.5 - 1.9 mmol/L   Comment NOTIFIED PHYSICIAN   I-Stat Beta hCG blood, ED ([facility], [facility], [facility] only)     Status: None   Collection Time: 09/02/17  7:57 PM  Result Value Ref Range   I-stat hCG, quantitative <5.0 <5 mIU/mL   Comment 3            Comment:   GEST. AGE      CONC.  (mIU/mL)   <=1 WEEK        5 - 50     2 WEEKS       50 - 500     3 WEEKS       100 - 10,000     4 WEEKS     1,000 - 30,000        FEMALE AND NON-PREGNANT FEMALE:     LESS THAN 5 mIU/mL   Lactic acid, plasma     Status: Abnormal   Collection Time: 09/02/17  8:27 PM  Result Value Ref Range   Lactic Acid, Venous 2.3 (HH) 0.5 - 1.9 mmol/L    Comment: CRITICAL RESULT CALLED TO, READ BACK BY AND VERIFIED WITH: J FRICKEY,RN '@2140'$  09/02/17 MKELLY CORRECTED ON 01/12 AT 2136: PREVIOUSLY REPORTED AS 2.3 CRITICAL RESULT CALLED TO, READ BACK BY AND VERIFIED WITH: K RYAN,RN '@2100'$  09/02/17 MKELLY   Urinalysis, Routine w reflex microscopic     Status: None   Collection Time: 09/02/17  8:34 PM  Result Value Ref Range   Color, Urine YELLOW YELLOW   APPearance CLEAR CLEAR   Specific Gravity, Urine 1.005 1.005 - 1.030   pH 5.0 5.0 - 8.0   Glucose, UA NEGATIVE NEGATIVE mg/dL   Hgb urine dipstick NEGATIVE NEGATIVE   Bilirubin Urine NEGATIVE NEGATIVE   Ketones, ur NEGATIVE NEGATIVE mg/dL   Protein, ur NEGATIVE NEGATIVE mg/dL   Nitrite NEGATIVE NEGATIVE   Leukocytes, UA NEGATIVE NEGATIVE  Urine rapid drug screen (hosp performed)     Status: Abnormal   Collection Time: 09/02/17  8:34 PM  Result Value Ref Range   Opiates NONE DETECTED NONE DETECTED   Cocaine NONE DETECTED NONE DETECTED   Benzodiazepines POSITIVE (A) NONE DETECTED   Amphetamines NONE DETECTED NONE DETECTED   Tetrahydrocannabinol NONE DETECTED NONE DETECTED   Barbiturates NONE DETECTED NONE DETECTED    Comment: (NOTE) DRUG SCREEN FOR MEDICAL PURPOSES ONLY.  IF CONFIRMATION IS NEEDED FOR ANY PURPOSE, NOTIFY LAB WITHIN 5 DAYS. LOWEST  DETECTABLE LIMITS FOR URINE DRUG SCREEN Drug Class                     Cutoff (ng/mL) Amphetamine and metabolites    1000 Barbiturate and metabolites    200 Benzodiazepine                 536 Tricyclics and metabolites     300 Opiates and metabolites        300 Cocaine and metabolites        300 THC                            50   Pregnancy, urine     Status: None   Collection Time: 09/02/17  8:34 PM  Result Value Ref Range   Preg Test, Ur NEGATIVE NEGATIVE    Comment:  THE SENSITIVITY OF THIS METHODOLOGY IS >20 mIU/mL.   Strep pneumoniae urinary antigen     Status: None   Collection Time: 09/02/17  8:34 PM  Result Value Ref Range   Strep Pneumo Urinary Antigen NEGATIVE NEGATIVE    Comment:        Infection due to S. pneumoniae cannot be absolutely ruled out since the antigen present may be below the detection limit of the test.   Influenza panel by PCR (type A & B)     Status: None   Collection Time: 09/02/17  8:37 PM  Result Value Ref Range   Influenza A By PCR NEGATIVE NEGATIVE   Influenza B By PCR NEGATIVE NEGATIVE    Comment: (NOTE) The Xpert Xpress Flu assay is intended as an aid in the diagnosis of  influenza and should not be used as a sole basis for treatment.  This  assay is FDA approved for nasopharyngeal swab specimens only. Nasal  washings and aspirates are unacceptable for Xpert Xpress Flu testing.   Acetaminophen level     Status: Abnormal   Collection Time: 09/02/17  8:53 PM  Result Value Ref Range   Acetaminophen (Tylenol), Serum <10 (L) 10 - 30 ug/mL    Comment:        THERAPEUTIC CONCENTRATIONS VARY SIGNIFICANTLY. A RANGE OF 10-30 ug/mL MAY BE AN EFFECTIVE CONCENTRATION FOR MANY PATIENTS. HOWEVER, SOME ARE BEST TREATED AT CONCENTRATIONS OUTSIDE THIS RANGE. ACETAMINOPHEN CONCENTRATIONS >150 ug/mL AT 4 HOURS AFTER INGESTION AND >50 ug/mL AT 12 HOURS AFTER INGESTION ARE OFTEN ASSOCIATED WITH TOXIC REACTIONS.   Blood gas, arterial ([facility] &  [facility] ONLY)     Status: Abnormal (Preliminary result)   Collection Time: 09/02/17  9:15 PM  Result Value Ref Range   FIO2 PENDING    O2 Content 2.0 L/min   Delivery systems NO CHARGE    pH, Arterial 7.321 (L) 7.350 - 7.450   pCO2 arterial 34.0 32.0 - 48.0 mmHg   pO2, Arterial 103 83.0 - 108.0 mmHg   Bicarbonate 17.1 (L) 20.0 - 28.0 mmol/L   Acid-base deficit 7.7 (H) 0.0 - 2.0 mmol/L   O2 Saturation 97.4 %   Patient temperature 98.6    Collection site LEFT RADIAL    Drawn by [id number]    Sample type ARTERIAL DRAW    Allens test (pass/fail) PASS PASS  Salicylate level     Status: None   Collection Time: 09/02/17  9:15 PM  Result Value Ref Range   Salicylate Lvl <2.1 2.8 - 30.0 mg/dL  POC CBG, ED     Status: Abnormal   Collection Time: 09/02/17  9:27 PM  Result Value Ref Range   Glucose-Capillary 101 (H) 65 - 99 mg/dL  I-Stat CG4 Lactic Acid, ED     Status: Abnormal   Collection Time: 09/02/17  9:43 PM  Result Value Ref Range   Lactic Acid, Venous 2.36 (HH) 0.5 - 1.9 mmol/L   Comment NOTIFIED PHYSICIAN   CBC     Status: Abnormal   Collection Time: 09/03/17  5:00 AM  Result Value Ref Range   WBC 7.8 4.0 - 10.5 K/uL   RBC 3.43 (L) 3.87 - 5.11 MIL/uL   Hemoglobin 10.9 (L) 12.0 - 15.0 g/dL   HCT 32.2 (L) 36.0 - 46.0 %   MCV 93.9 78.0 - 100.0 fL   MCH 31.8 26.0 - 34.0 pg   MCHC 33.9 30.0 - 36.0 g/dL   RDW 13.4 11.5 - 15.5 %  Platelets 148 (L) 150 - 400 K/uL    Comment: DELTA CHECK NOTED REPEATED TO VERIFY PLATELET COUNT CONFIRMED BY SMEAR   Basic metabolic panel     Status: Abnormal   Collection Time: 09/03/17  5:00 AM  Result Value Ref Range   Sodium 138 135 - 145 mmol/L    Comment: DELTA CHECK NOTED   Potassium 4.1 3.5 - 5.1 mmol/L    Comment: DELTA CHECK NOTED   Chloride 112 (H) 101 - 111 mmol/L   CO2 21 (L) 22 - 32 mmol/L   Glucose, Bld 94 65 - 99 mg/dL   BUN 20 6 - 20 mg/dL   Creatinine, Ser 1.09 (H) 0.44 - 1.00 mg/dL   Calcium 7.6 (L) 8.9 - 10.3 mg/dL   GFR calc  non Af Amer >60 >60 mL/min   GFR calc Af Amer >60 >60 mL/min    Comment: (NOTE) The eGFR has been calculated using the CKD EPI equation. This calculation has not been validated in all clinical situations. eGFR's persistently <60 mL/min signify possible Chronic Kidney Disease.    Anion gap 5 5 - 15  I-Stat CG4 Lactic Acid, ED  (not at  [facility])     Status: Abnormal   Collection Time: 09/03/17  5:48 AM  Result Value Ref Range   Lactic Acid, Venous 1.94 (H) 0.5 - 1.9 mmol/L  Ammonia     Status: None   Collection Time: 09/03/17  6:16 AM  Result Value Ref Range   Ammonia 25 9 - 35 umol/L    Comment: MODERATE HEMOLYSIS  I-Stat CG4 Lactic Acid, ED     Status: None   Collection Time: 09/03/17  7:52 AM  Result Value Ref Range   Lactic Acid, Venous 1.55 0.5 - 1.9 mmol/L   Dg Chest 2 View  Result Date: 09/02/2017 CLINICAL DATA:  Upper respiratory symptoms suspect flu. EXAM: CHEST  2 VIEW COMPARISON:  June 20, 2017 FINDINGS: The mediastinal contour is normal. The heart size is enlarged. There is pulmonary edema. There is consolidation of the right lung base. The bony structures are unremarkable. IMPRESSION: Right lung base pneumonia. Pulmonary edema. Electronically Signed   By: Abelardo Diesel M.D.   On: 09/02/2017 17:37    Pending Labs Unresulted Labs (From admission, onward)   Start     Ordered   09/04/17 0500  Creatinine, serum  Daily,   R     09/02/17 2054   09/04/17 0500  CBC with Differential/Platelet  Tomorrow morning,   R     09/03/17 0800   09/04/17 2751  Basic metabolic panel  Tomorrow morning,   R     09/03/17 0800   09/03/17 0759  MRSA PCR Screening  Once,   R     09/03/17 0758   09/02/17 2008  Lactic acid, plasma  STAT Now then every 3 hours,   R     09/02/17 2009   09/02/17 2007  Culture, sputum-assessment  Once,   R     09/02/17 2009   09/02/17 2007  Gram stain  Once,   R     09/02/17 2009   09/02/17 2007  HIV antibody  Once,   R     09/02/17 2009   09/02/17 1817   Culture, blood (routine x 2)  BLOOD CULTURE X 2,   STAT     09/02/17 1816      Vitals/Pain Today's Vitals   09/03/17 0630 09/03/17 0701 09/03/17 0730 09/03/17 1000  BP: Marland Kitchen)  110/57 (!) 121/54 (!) 118/49 (!) 115/58  Pulse: 88 86    Resp: (!) 34 (!) 31 (!) 32 (!) 39  Temp:      TempSrc:      SpO2: 99% 98% 98% 97%  Weight:      Height:      PainSc:        Isolation Precautions Droplet precaution  Medications Medications  ondansetron (ZOFRAN) tablet 4 mg (not administered)    Or  ondansetron (ZOFRAN) injection 4 mg (not administered)  enoxaparin (LOVENOX) injection 40 mg (40 mg Subcutaneous Given 09/03/17 0045)  0.9 %  sodium chloride infusion ( Intravenous New Bag/Given 09/03/17 0746)  potassium chloride 10 mEq in 100 mL IVPB (0 mEq Intravenous Stopped 09/03/17 0229)  azithromycin (ZITHROMAX) 500 mg in dextrose 5 % 250 mL IVPB (not administered)  naloxone (NARCAN) injection 0.4 mg (0.4 mg Intravenous Given 09/03/17 0459)  naloxone (NARCAN) 4 mg in dextrose 5 % 250 mL infusion (0.25 mg/hr Intravenous New Bag/Given 09/03/17 0514)  ipratropium-albuterol (DUONEB) 0.5-2.5 (3) MG/3ML nebulizer solution 3 mL (3 mLs Nebulization Given 09/03/17 1030)  vancomycin (VANCOCIN) IVPB 750 mg/150 ml premix (not administered)  cefTRIAXone (ROCEPHIN) 2 g in dextrose 5 % 50 mL IVPB (not administered)  sodium chloride 0.9 % bolus 1,000 mL (0 mLs Intravenous Stopped 09/02/17 2145)  sodium chloride 0.9 % bolus 1,000 mL (0 mLs Intravenous Stopped 09/02/17 2341)  cefTRIAXone (ROCEPHIN) 1 g in dextrose 5 % 50 mL IVPB (0 g Intravenous Stopped 09/02/17 1929)  azithromycin (ZITHROMAX) 500 mg in dextrose 5 % 250 mL IVPB (0 mg Intravenous Stopped 09/02/17 2101)  sodium chloride 0.9 % bolus 1,000 mL (0 mLs Intravenous Stopped 09/02/17 2101)    And  sodium chloride 0.9 % bolus 1,000 mL (0 mLs Intravenous Stopped 09/02/17 2101)  vancomycin (VANCOCIN) 2,000 mg in sodium chloride 0.9 % 500 mL IVPB (0 mg Intravenous Stopped  09/03/17 0043)  naloxone (NARCAN) injection 0.2 mg (0.2 mg Intravenous Given 09/02/17 2130)  naloxone (NARCAN) 0.4 MG/ML injection (0.4 mg/mL  Given 09/02/17 2231)    Mobility walks

## 2017-09-03 NOTE — Progress Notes (Signed)
  Echocardiogram 2D Echocardiogram has been performed.  Judy Robles 09/03/2017, 9:22 AM

## 2017-09-03 NOTE — Progress Notes (Signed)
PROGRESS NOTE  Judy Robles TDD:[id number] DOB: [birth year] DOA: 09/02/2017 PCP: Jearld Lesch, MD   Brief summary:  Patient presented to the ED due to n/v with nonbloody, nonbilious emesis for the last 24hrs, She also reports that she has been having several episodes of nonbloody diarrhea. she reports generalized weakness and subjective fever, she has pleuritic chest pain and feeling sob. she is concerned about flu like symptom due to reported sick contact.   She reports to EDP that "She states the last time she took Suboxone was approximately 5 days ago.  Patient states that she did not want to take it anymore as she did not like the way feel.  Patient states that she took her Xanax today (1/12). "  cxr concerns for pneumonia, she has elevated lactic acidosis, lactic acid 2.7 on presentation, 02 dropped to 89-91% while in the ED, she is put on 2liter oxygen. She became hypotensive In the ED, sbp dropped to the 70's at one point in the ED. code sepsis started in the ED, blood culture/ua obtained in the ED, she is started on ivf/abx, hospitalist called to admit the patient.   Patient initially was alert and oriented , was able to provide detailed history, however, she became somnolent later. perEDP "Patient responsive to small dose of Narcan.  Patient did have a visitor that was in the room who left shortly before patient became somnolent.  Unsure if patient was given any other substance"    HPI/Recap of past 24 hours:  Drowsy On narcan drip, tachypnea, but no hypoxia on 2liter oxygen   Assessment/Plan: Principal Problem:   Community acquired pneumonia of right lower lobe of lung (HCC) Active Problems:   Polysubstance dependence including opioid type drug with complication, episodic abuse (HCC)   Sepsis associated hypotension (HCC)   AKI (acute kidney injury) (HCC)   Nausea and vomiting   Encephalopathy: She was able to provide history to EDP, however she become very lethargic  later From sepsis? From unknown substance brought in by visitor?  She is started on narcan drip in the ED, due to response to narcan dose trial. Repeat uds., check ammonia level,  D/c narcan drip, continue narcan prn.   Sepsis /Acute hypoxic respiratory failure  from pneumonia -she has elevated lactic acidosis, lactic acid 2.7 on presentation, 02 dropped to 89-91% while in the ED, she is put on 2liter oxygen. She became hypotensive In the ED, sbp dropped to the 70's at one point in the ED. -CT chest "Diffuse, but right greater than left airspace and ground-glass opacity, most consistent with infection." " Mild thoracic adenopathy, likely reactive" -Flu test negative. mrsa screening negative, blood culture in process. ua no infection.  Urine strep pneumonia negative.  -Urine legionella pending collection, Sputum culture pending collection,  -continue ivf/rocephin/zithro, d/c vanc   Scattered pulmonary nodules 73mm or less on Ct chest in a heavy smoker Non-contrast chest CT can be considered in 12 months if patient is high-risk.  Thrombocytopenia on presentation: plt on presentation is 103,  Likely from acute infection Improved on repeat, continue to monitor.  AKI: likely prerenal -Cr on presentation 1.45, ua no infection. -Improving on ivf, renal dosing meds  N/V/D: Non in the hospital. On ivf.  Mild hyponatremia: Sodium 131 on presentation, normalized with hydration.  Markedly age advanced coronary artery atherosclerosis / Aortic Atherosclerosis on ct chest: Echo eval Troponin negative  Normocytic anemia: likely anemia on chronic disease hgb close to baseline around 11.  Mild  elevated ast Hepatic steatosis on ct scan H/o hepC  H/o hepatitis C, doubt treated with h/o substance abuse.  H/o polysubstance dependence  "She states the last time she took Suboxone was approximately 5 days ago.  Patient states that she did not want to take it anymore as she did not like the way  feel.  Patient states that she took her Xanax today (1/12). " "Patient denies any cocaine, heroin, marijuana use.  She denies any alcohol use." Significant other reports patient smoke cigarette nonstop.   Psych: adderall, xanax and suboxone listed as home meds.  Currently very lethargic, not able to provide history.   Code Status: full  Family Communication: patient   Disposition Plan: remain in stepdown   Consultants:  none  Procedures:  none  Antibiotics:  vanc from admission   Rocephin/zithro from admission   Objective: BP (!) 118/49   Pulse 86   Temp 99.1 F (37.3 C) (Oral)   Resp (!) 32   Ht 5\' 7"  (1.702 m)   Wt 117.9 kg (260 lb)   SpO2 98%   BMI 40.72 kg/m  No intake or output data in the 24 hours ending 09/03/17 1009 Filed Weights   09/02/17 2037  Weight: 117.9 kg (260 lb)    Exam: Patient is examined daily including today on 09/03/2017, exams remain the same as of yesterday except that has changed    General:  Very drowsy  Cardiovascular: RRR   Respiratory: diminished, no wheezing, some rales, no rhonchi  Abdomen: Soft/ND/NT, positive BS  Musculoskeletal: No Edema  Neuro: drowsy  Data Reviewed: Basic Metabolic Panel: Recent Labs  Lab 09/02/17 1708 09/03/17 0500  NA 131* 138  K 3.4* 4.1  CL 104 112*  CO2 20* 21*  GLUCOSE 112* 94  BUN 20 20  CREATININE 1.45* 1.09*  CALCIUM 8.1* 7.6*   Liver Function Tests: Recent Labs  Lab 09/02/17 1708  AST 48*  ALT 34  ALKPHOS 46  BILITOT 1.1  PROT 6.2*  ALBUMIN 2.7*   Recent Labs  Lab 09/02/17 1708  LIPASE 23   Recent Labs  Lab 09/03/17 0616  AMMONIA 25   CBC: Recent Labs  Lab 09/02/17 1708 09/03/17 0500  WBC 6.0 7.8  NEUTROABS 4.6  --   HGB 11.3* 10.9*  HCT 33.9* 32.2*  MCV 93.6 93.9  PLT 92* 148*   Cardiac Enzymes:   No results for input(s): CKTOTAL, CKMB, CKMBINDEX, TROPONINI in the last 168 hours. BNP (last 3 results) Recent Labs    10/09/16 0302  01/31/17 1238  BNP 474.1* 22.6    ProBNP (last 3 results) No results for input(s): PROBNP in the last 8760 hours.  CBG: Recent Labs  Lab 09/02/17 2127  GLUCAP 101*    No results found for this or any previous visit (from the past 240 hour(s)).   Studies: Dg Chest 2 View  Result Date: 09/02/2017 CLINICAL DATA:  Upper respiratory symptoms suspect flu. EXAM: CHEST  2 VIEW COMPARISON:  June 20, 2017 FINDINGS: The mediastinal contour is normal. The heart size is enlarged. There is pulmonary edema. There is consolidation of the right lung base. The bony structures are unremarkable. IMPRESSION: Right lung base pneumonia. Pulmonary edema. Electronically Signed   By: June 22, 2017 M.D.   On: 09/02/2017 17:37    Scheduled Meds: . enoxaparin (LOVENOX) injection  40 mg Subcutaneous Q24H  . ipratropium-albuterol  3 mL Nebulization TID    Continuous Infusions: . sodium chloride 125 mL/hr at 09/03/17 0746  .  azithromycin    . cefTRIAXone (ROCEPHIN)  IV    . naLOXone (NARCAN) adult infusion for OVERDOSE 0.25 mg/hr (09/03/17 0514)  . vancomycin       Time spent: 35 mins I have personally reviewed and interpreted on  09/03/2017 daily labs, tele strips, imagings as discussed above under date review session and assessment and plans.  I reviewed all nursing notes, pharmacy notes, vitals, pertinent old records  I have discussed plan of care as described above with RN , patient and family (significant other) on 09/03/2017   Albertine Grates MD, PhD  [facility] Pager [phone]. If 7PM-7AM, please contact night-coverage at [email or web address], password [facility] 09/03/2017, 10:09 AM  LOS: 1 day

## 2017-09-04 LAB — RAPID URINE DRUG SCREEN, HOSP PERFORMED
AMPHETAMINES: NOT DETECTED
BARBITURATES: NOT DETECTED
Benzodiazepines: POSITIVE — AB
COCAINE: NOT DETECTED
OPIATES: NOT DETECTED
TETRAHYDROCANNABINOL: NOT DETECTED

## 2017-09-04 LAB — CBC WITH DIFFERENTIAL/PLATELET
BASOS PCT: 0 %
Basophils Absolute: 0 10*3/uL (ref 0.0–0.1)
EOS ABS: 0.1 10*3/uL (ref 0.0–0.7)
Eosinophils Relative: 1 %
HCT: 29.5 % — ABNORMAL LOW (ref 36.0–46.0)
Hemoglobin: 9.9 g/dL — ABNORMAL LOW (ref 12.0–15.0)
LYMPHS ABS: 1.7 10*3/uL (ref 0.7–4.0)
Lymphocytes Relative: 17 %
MCH: 31.8 pg (ref 26.0–34.0)
MCHC: 33.6 g/dL (ref 30.0–36.0)
MCV: 94.9 fL (ref 78.0–100.0)
MONOS PCT: 5 %
Monocytes Absolute: 0.5 10*3/uL (ref 0.1–1.0)
NEUTROS PCT: 77 %
Neutro Abs: 7.6 10*3/uL (ref 1.7–7.7)
PLATELETS: 144 10*3/uL — AB (ref 150–400)
RBC: 3.11 MIL/uL — ABNORMAL LOW (ref 3.87–5.11)
RDW: 13.7 % (ref 11.5–15.5)
WBC: 9.9 10*3/uL (ref 4.0–10.5)

## 2017-09-04 LAB — BLOOD GAS, ARTERIAL
Acid-base deficit: 7.7 mmol/L — ABNORMAL HIGH (ref 0.0–2.0)
Bicarbonate: 17.1 mmol/L — ABNORMAL LOW (ref 20.0–28.0)
DRAWN BY: 514251
FIO2: 28
O2 CONTENT: 2 L/min
O2 SAT: 97.4 %
Patient temperature: 98.6
pCO2 arterial: 34 mmHg (ref 32.0–48.0)
pH, Arterial: 7.321 — ABNORMAL LOW (ref 7.350–7.450)
pO2, Arterial: 103 mmHg (ref 83.0–108.0)

## 2017-09-04 LAB — TROPONIN I: Troponin I: <0.03 ng/mL (ref <0.03)

## 2017-09-04 LAB — BASIC METABOLIC PANEL
Anion gap: 8 (ref 5–15)
BUN: 14 mg/dL (ref 6–20)
CALCIUM: 7.8 mg/dL — AB (ref 8.9–10.3)
CO2: 18 mmol/L — ABNORMAL LOW (ref 22–32)
CREATININE: 0.83 mg/dL (ref 0.44–1.00)
Chloride: 110 mmol/L (ref 101–111)
Glucose, Bld: 107 mg/dL — ABNORMAL HIGH (ref 65–99)
Potassium: 3.7 mmol/L (ref 3.5–5.1)
SODIUM: 136 mmol/L (ref 135–145)

## 2017-09-04 LAB — LACTIC ACID, PLASMA: LACTIC ACID, VENOUS: 1.9 mmol/L (ref 0.5–1.9)

## 2017-09-04 MED ORDER — ENOXAPARIN SODIUM 60 MG/0.6ML ~~LOC~~ SOLN
60.0000 mg | SUBCUTANEOUS | Status: DC
  Administered 2017-09-04 – 2017-09-07 (×4): 60 mg via SUBCUTANEOUS
  Filled 2017-09-04 (×4): qty 0.6

## 2017-09-04 MED ORDER — KETOROLAC TROMETHAMINE 15 MG/ML IJ SOLN
7.5000 mg | Freq: Three times a day (TID) | INTRAMUSCULAR | Status: AC | PRN
  Administered 2017-09-04: 7.5 mg via INTRAVENOUS
  Filled 2017-09-04: qty 1

## 2017-09-04 MED ORDER — SODIUM BICARBONATE 650 MG PO TABS
650.0000 mg | ORAL_TABLET | Freq: Two times a day (BID) | ORAL | Status: DC
  Administered 2017-09-04 – 2017-09-08 (×8): 650 mg via ORAL
  Filled 2017-09-04 (×8): qty 1

## 2017-09-04 MED ORDER — MAGNESIUM SULFATE 2 GM/50ML IV SOLN
2.0000 g | Freq: Once | INTRAVENOUS | Status: AC
  Administered 2017-09-04: 2 g via INTRAVENOUS
  Filled 2017-09-04: qty 50

## 2017-09-04 MED ORDER — GUAIFENESIN ER 600 MG PO TB12
600.0000 mg | ORAL_TABLET | Freq: Two times a day (BID) | ORAL | Status: DC
  Administered 2017-09-04 – 2017-09-08 (×9): 600 mg via ORAL
  Filled 2017-09-04 (×9): qty 1
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
## 2017-09-04 NOTE — Plan of Care (Signed)
  Safety: Ability to remain free from injury will improve, tele sitter monitor 09/04/2017 2100 - Progressing by Antionette Char, RN

## 2017-09-04 NOTE — Progress Notes (Signed)
This RN went in to the patients room to reassess patients abdominal and groin skin. A light green oval pill was found in the patients abdominal fold. The pill was collected for identification. The pill is scored with the number S [id number] on one side. Per identification from pharmacy, the pill is  Alprazolam 1 mg. The MD, charge nurse and administrative coordinator were all informed of the incident.  Patients current vital signs are heart rate 86,  normal sinus rhythm, oxygen is 94 percent on 2 liters via nasal cannula. Blood pressure is 110/46. Respirations are 29.Patient is drowsy but responds to speech and is oriented to person, place, time and situation. A tele sitter order has been placed for patient safety. Will continue to monitor

## 2017-09-04 NOTE — Progress Notes (Signed)
Patient has been breathing 35-50 breaths per minute, short and shallow using accessory  muscles, sat 95 % at 2 L Judy Robles, she is alert and oriented, no wheezing noted, paged on call [place], no order received, will continue to monitor.

## 2017-09-04 NOTE — Care Management Note (Signed)
Case Management Note  Patient Details  Name: Judy Robles MRN: [id number] Date of Birth: 06/07/1976  Subjective/Objective:                  Resp. distress  Action/Plan: Date: September 04, 2017 Marcelle Smiling, BSN, [place], [place] [phone] Chart and notes review for patient progress and needs. Will follow for case management and discharge needs. No cm or discharge needs present at time of this review. Next review date: [id number] Expected Discharge Date:                  Expected Discharge Plan:  Home/Self Care  In-House Referral:     Discharge planning Services  CM Consult  Post Acute Care Choice:    Choice offered to:     DME Arranged:    DME Agency:     HH Arranged:    HH Agency:     Status of Service:  In process, will continue to follow  If discussed at Long Length of Stay Meetings, dates discussed:    Additional Comments:  Golda Acre, RN 09/04/2017, 9:00 AM

## 2017-09-04 NOTE — Progress Notes (Signed)
RT placed patient on CPAP. Patients setting is auto 6-20 cmH2O. Sterile water added to water chamber for humidification. Patient is tolerating well
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
## 2017-09-05 LAB — BASIC METABOLIC PANEL
ANION GAP: 8 (ref 5–15)
BUN: 12 mg/dL (ref 6–20)
CALCIUM: 7.7 mg/dL — AB (ref 8.9–10.3)
CO2: 19 mmol/L — AB (ref 22–32)
Chloride: 110 mmol/L (ref 101–111)
Creatinine, Ser: 0.81 mg/dL (ref 0.44–1.00)
GLUCOSE: 84 mg/dL (ref 65–99)
Potassium: 3.9 mmol/L (ref 3.5–5.1)
Sodium: 137 mmol/L (ref 135–145)

## 2017-09-05 LAB — CBC WITH DIFFERENTIAL/PLATELET
BASOS ABS: 0 10*3/uL (ref 0.0–0.1)
Basophils Relative: 0 %
Eosinophils Absolute: 0.1 10*3/uL (ref 0.0–0.7)
Eosinophils Relative: 1 %
HEMATOCRIT: 28.6 % — AB (ref 36.0–46.0)
Hemoglobin: 9.5 g/dL — ABNORMAL LOW (ref 12.0–15.0)
LYMPHS PCT: 29 %
Lymphs Abs: 2.6 10*3/uL (ref 0.7–4.0)
MCH: 31.8 pg (ref 26.0–34.0)
MCHC: 33.2 g/dL (ref 30.0–36.0)
MCV: 95.7 fL (ref 78.0–100.0)
MONO ABS: 0.6 10*3/uL (ref 0.1–1.0)
Monocytes Relative: 6 %
NEUTROS ABS: 5.6 10*3/uL (ref 1.7–7.7)
Neutrophils Relative %: 64 %
PLATELETS: 148 10*3/uL — AB (ref 150–400)
RBC: 2.99 MIL/uL — AB (ref 3.87–5.11)
RDW: 13.8 % (ref 11.5–15.5)
WBC: 8.8 10*3/uL (ref 4.0–10.5)

## 2017-09-05 LAB — LIPID PANEL
CHOL/HDL RATIO: 4 ratio
Cholesterol: 88 mg/dL (ref 0–200)
HDL: 22 mg/dL — AB (ref >40)
LDL Cholesterol: 50 mg/dL (ref 0–99)
TRIGLYCERIDES: 78 mg/dL (ref <150)
VLDL: 16 mg/dL (ref 0–40)

## 2017-09-05 LAB — EXPECTORATED SPUTUM ASSESSMENT W REFEX TO RESP CULTURE

## 2017-09-05 LAB — EXPECTORATED SPUTUM ASSESSMENT W GRAM STAIN, RFLX TO RESP C

## 2017-09-05 LAB — LEGIONELLA PNEUMOPHILA SEROGP 1 UR AG: L. PNEUMOPHILA SEROGP 1 UR AG: NEGATIVE

## 2017-09-05 MED ORDER — GUAIFENESIN-DM 100-10 MG/5ML PO SYRP
5.0000 mL | ORAL_SOLUTION | ORAL | Status: DC | PRN
  Administered 2017-09-05 – 2017-09-07 (×5): 5 mL via ORAL
  Filled 2017-09-05 (×5): qty 10

## 2017-09-05 MED ORDER — AZITHROMYCIN 250 MG PO TABS
500.0000 mg | ORAL_TABLET | Freq: Every day | ORAL | Status: DC
  Administered 2017-09-05: 500 mg via ORAL
  Filled 2017-09-05: qty 2

## 2017-09-05 MED ORDER — LEVOFLOXACIN 750 MG PO TABS
750.0000 mg | ORAL_TABLET | Freq: Every day | ORAL | Status: DC
  Administered 2017-09-06 – 2017-09-08 (×3): 750 mg via ORAL
  Filled 2017-09-05 (×3): qty 1

## 2017-09-05 NOTE — Progress Notes (Signed)
Patient was assisted into bathroom by NT, left with instructions to call for assist before rising as patient reported need to have BM. RN rounded for safety and assessment, noted that room had very strong burning cigarette odor. Patient still in bathroom. Patient initially denied smoking, reported "I have cigarettes in my purse, that's what the smell is. Besides, I can't smoke, I can't even breathe." RN stayed with patient at bathroom door. When patient stood to return to bed, patient apologized and handed RN her lighter she had hidden under panis. Patient returned to bed. All belongings to nurse's station with patient permission to be searched by security. Skin assessment was done at time of assessment but RN repeated skin assessment under panis and breasts to check for any other belongings, none noted. Patient reports to RN that her purse has cigarettes and some loose Xanax pills she has to hide to keep her son from stealing them. Patient comfortable in bed, O2 in place, educated patient that oxygen is flammable and under no circumstances can she have flame or spark of any kind in her room, patient verbalizes understanding. Tele sitter in place. Will monitor closely for safety.

## 2017-09-05 NOTE — Progress Notes (Addendum)
Patient was transferred from 6E at 1530; alert and oriented x4; vital signs was taken; personal clothes is kept in patient belonging closet; on telesitter for safety. Will monitor patient as protocol.

## 2017-09-05 NOTE — Progress Notes (Signed)
Pharmacy IV to PO conversion  This patient is receiving Azithromycin by the intravenous route. Based on criteria approved by the Pharmacy and Therapeutics Committee, and the Infectious Disease Division, the antibiotic(s) is/are being converted to equivalent oral dose form(s). These criteria include:   Patient being treated for a respiratory tract infection, urinary tract infection, cellulitis, or Clostridium Difficile Associated Diarrhea  The patient is not neutropenic and does not exhibit a GI malabsorption state  The patient is eating (either orally or per tube) and/or has been taking other orally administered medications for at least 24 hours.  The patient is improving clinically (physician assessment and a 24-hour Tmax of <=100.5 F)  If you have any questions about this conversion, please contact the Pharmacy Department (ext [phone]).  Thank you.  Bernadene Person, PharmD Pager: [phone] 09/05/2017, 3:22 PM
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
## 2017-09-05 NOTE — Evaluation (Signed)
Physical Therapy Evaluation Patient Details Name: Judy Robles MRN: [id number] DOB: Feb 24, 1976 Today's Date: 09/05/2017   History of Present Illness  42 yo female cmes to ED with complaint of flu-like symptoms of fever, weakness, emesis and generalized weakness. H/O depression, HTN SA, polysubstance abuse, in hospital  in recnet months for drug overdose  Clinical Impression  Patient ambulated on  RA, sats are 89% . Instructed in pursed lip breaths. Pt admitted with above diagnosis. Pt currently with functional limitations due to the deficits listed below (see PT Problem List).  Pt will benefit from skilled PT to increase their independence and safety with mobility to allow discharge to the venue listed below.      Follow Up Recommendations No PT follow up    Equipment Recommendations  None recommended by PT    Recommendations for Other Services       Precautions / Restrictions Precautions Precautions: Fall Precaution Comments: monitor sats      Mobility  Bed Mobility Overal bed mobility: Independent                Transfers Overall transfer level: Needs assistance   Transfers: Sit to/from Stand Sit to Stand: Supervision            Ambulation/Gait Ambulation/Gait assistance: Supervision Ambulation Distance (Feet): 100 Feet   Gait Pattern/deviations: Step-through pattern Gait velocity: decr   General Gait Details: pushed IV pole  Stairs            Wheelchair Mobility    Modified Rankin (Stroke Patients Only)       Balance                                             Pertinent Vitals/Pain Pain Assessment: Faces Faces Pain Scale: Hurts little more Pain Location: back Pain Descriptors / Indicators: Aching Pain Intervention(s): Monitored during session    Home Living Family/patient expects to be discharged to:: Private residence Living Arrangements: Non-relatives/Friends Available Help at Discharge: Friend(s);Available  PRN/intermittently;Family Type of Home: House Home Access: Stairs to enter   Secretary/administrator of Steps: 3 Home Layout: One level Home Equipment: None      Prior Function Level of Independence: Independent               Hand Dominance        Extremity/Trunk Assessment   Upper Extremity Assessment Upper Extremity Assessment: Generalized weakness    Lower Extremity Assessment Lower Extremity Assessment: Generalized weakness    Cervical / Trunk Assessment Cervical / Trunk Assessment: Normal  Communication   Communication: No difficulties  Cognition Arousal/Alertness: Awake/alert Behavior During Therapy: WFL for tasks assessed/performed Overall Cognitive Status: Impaired/Different from baseline Area of Impairment: Orientation                 Orientation Level: Time             General Comments: not oriented to date      General Comments      Exercises     Assessment/Plan    PT Assessment Patient needs continued PT services  PT Problem List Decreased strength;Decreased activity tolerance;Decreased mobility       PT Treatment Interventions Gait training    PT Goals (Current goals can be found in the Care Plan section)  Acute Rehab PT Goals Patient Stated Goal: to get better PT Goal Formulation: With patient  Time For Goal Achievement: 09/12/17 Potential to Achieve Goals: Good    Frequency Min 2X/week   Barriers to discharge        Co-evaluation               AM-PAC PT "6 Clicks" Daily Activity  Outcome Measure Difficulty turning over in bed (including adjusting bedclothes, sheets and blankets)?: None Difficulty moving from lying on back to sitting on the side of the bed? : None Difficulty sitting down on and standing up from a chair with arms (e.g., wheelchair, bedside commode, etc,.)?: A Little Help needed moving to and from a bed to chair (including a wheelchair)?: A Little Help needed walking in hospital room?: A  Little Help needed climbing 3-5 steps with a railing? : A Lot 6 Click Score: 19    End of Session   Activity Tolerance: Patient tolerated treatment well Patient left: in chair;with nursing/sitter in room(going to new room)   PT Visit Diagnosis: Unsteadiness on feet (R26.81)    Time: 1308-6578 PT Time Calculation (min) (ACUTE ONLY): 28 min   Charges:   PT Evaluation $PT Eval Low Complexity: 1 Low PT Treatments $Gait Training: 8-22 mins   PT G CodesBlanchard Kelch PT [phone]   Rada Hay 09/05/2017, 3:45 PM

## 2017-09-06 ENCOUNTER — Inpatient Hospital Stay (HOSPITAL_COMMUNITY): Payer: Medicaid Other

## 2017-09-06 LAB — CBC WITH DIFFERENTIAL/PLATELET
BASOS ABS: 0 10*3/uL (ref 0.0–0.1)
Basophils Relative: 0 %
Eosinophils Absolute: 0.1 10*3/uL (ref 0.0–0.7)
Eosinophils Relative: 2 %
HEMATOCRIT: 28.9 % — AB (ref 36.0–46.0)
Hemoglobin: 9.7 g/dL — ABNORMAL LOW (ref 12.0–15.0)
LYMPHS ABS: 2.3 10*3/uL (ref 0.7–4.0)
LYMPHS PCT: 39 %
MCH: 31.9 pg (ref 26.0–34.0)
MCHC: 33.6 g/dL (ref 30.0–36.0)
MCV: 95.1 fL (ref 78.0–100.0)
Monocytes Absolute: 0.7 10*3/uL (ref 0.1–1.0)
Monocytes Relative: 11 %
Neutro Abs: 2.9 10*3/uL (ref 1.7–7.7)
Neutrophils Relative %: 48 %
PLATELETS: 135 10*3/uL — AB (ref 150–400)
RBC: 3.04 MIL/uL — AB (ref 3.87–5.11)
RDW: 13.4 % (ref 11.5–15.5)
WBC: 6 10*3/uL (ref 4.0–10.5)

## 2017-09-06 LAB — BASIC METABOLIC PANEL
Anion gap: 7 (ref 5–15)
BUN: 7 mg/dL (ref 6–20)
CALCIUM: 8.3 mg/dL — AB (ref 8.9–10.3)
CO2: 21 mmol/L — ABNORMAL LOW (ref 22–32)
Chloride: 111 mmol/L (ref 101–111)
Creatinine, Ser: 0.82 mg/dL (ref 0.44–1.00)
GFR calc non Af Amer: >60 mL/min (ref >60)
Glucose, Bld: 90 mg/dL (ref 65–99)
Potassium: 3.6 mmol/L (ref 3.5–5.1)
SODIUM: 139 mmol/L (ref 135–145)

## 2017-09-06 LAB — MAGNESIUM: MAGNESIUM: 1.6 mg/dL — AB (ref 1.7–2.4)

## 2017-09-06 MED ORDER — IPRATROPIUM-ALBUTEROL 0.5-2.5 (3) MG/3ML IN SOLN
3.0000 mL | RESPIRATORY_TRACT | Status: DC | PRN
  Administered 2017-09-06: 3 mL via RESPIRATORY_TRACT

## 2017-09-06 MED ORDER — BUPRENORPHINE HCL 2 MG SL SUBL
2.0000 mg | SUBLINGUAL_TABLET | Freq: Every day | SUBLINGUAL | Status: DC
  Administered 2017-09-06 – 2017-09-08 (×3): 2 mg via SUBLINGUAL
  Filled 2017-09-06 (×3): qty 1

## 2017-09-06 MED ORDER — IPRATROPIUM-ALBUTEROL 0.5-2.5 (3) MG/3ML IN SOLN
3.0000 mL | Freq: Four times a day (QID) | RESPIRATORY_TRACT | Status: DC
  Administered 2017-09-07 (×4): 3 mL via RESPIRATORY_TRACT
  Filled 2017-09-06 (×5): qty 3

## 2017-09-06 MED ORDER — SODIUM CHLORIDE 3 % IN NEBU
4.0000 mL | INHALATION_SOLUTION | Freq: Every day | RESPIRATORY_TRACT | Status: DC
  Administered 2017-09-06 – 2017-09-08 (×2): 4 mL via RESPIRATORY_TRACT
  Filled 2017-09-06 (×2): qty 4

## 2017-09-06 MED ORDER — MAGNESIUM SULFATE 4 GM/100ML IV SOLN
4.0000 g | Freq: Once | INTRAVENOUS | Status: AC
  Administered 2017-09-06: 4 g via INTRAVENOUS
  Filled 2017-09-06: qty 100

## 2017-09-06 MED ORDER — METHYLPREDNISOLONE SODIUM SUCC 40 MG IJ SOLR
40.0000 mg | Freq: Two times a day (BID) | INTRAMUSCULAR | Status: DC
  Administered 2017-09-06 – 2017-09-08 (×4): 40 mg via INTRAVENOUS
  Filled 2017-09-06 (×4): qty 1

## 2017-09-06 NOTE — Progress Notes (Signed)
Patient is on room air; spO2=83%; put patient back on 2L oxygen; spO2= 90%; 3L oxygen; spO2= 94%. Notified Dr. Margo Aye at same time.
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
## 2017-09-07 DIAGNOSIS — F1920 Other psychoactive substance dependence, uncomplicated: Secondary | ICD-10-CM

## 2017-09-07 DIAGNOSIS — N179 Acute kidney failure, unspecified: Secondary | ICD-10-CM

## 2017-09-07 DIAGNOSIS — R4182 Altered mental status, unspecified: Secondary | ICD-10-CM

## 2017-09-07 DIAGNOSIS — J181 Lobar pneumonia, unspecified organism: Secondary | ICD-10-CM

## 2017-09-07 LAB — BASIC METABOLIC PANEL
Anion gap: 6 (ref 5–15)
BUN: 9 mg/dL (ref 6–20)
CHLORIDE: 110 mmol/L (ref 101–111)
CO2: 23 mmol/L (ref 22–32)
CREATININE: 0.75 mg/dL (ref 0.44–1.00)
Calcium: 8.6 mg/dL — ABNORMAL LOW (ref 8.9–10.3)
GFR calc Af Amer: >60 mL/min (ref >60)
GFR calc non Af Amer: >60 mL/min (ref >60)
Glucose, Bld: 143 mg/dL — ABNORMAL HIGH (ref 65–99)
POTASSIUM: 4.1 mmol/L (ref 3.5–5.1)
SODIUM: 139 mmol/L (ref 135–145)

## 2017-09-07 LAB — CBC
HEMATOCRIT: 30.6 % — AB (ref 36.0–46.0)
HEMOGLOBIN: 10.3 g/dL — AB (ref 12.0–15.0)
MCH: 31.6 pg (ref 26.0–34.0)
MCHC: 33.7 g/dL (ref 30.0–36.0)
MCV: 93.9 fL (ref 78.0–100.0)
Platelets: 160 10*3/uL (ref 150–400)
RBC: 3.26 MIL/uL — AB (ref 3.87–5.11)
RDW: 13 % (ref 11.5–15.5)
WBC: 3.4 10*3/uL — ABNORMAL LOW (ref 4.0–10.5)

## 2017-09-07 NOTE — Progress Notes (Signed)
Physical Therapy Treatment Patient Details Name: Judy Robles MRN: [id number] DOB: 06-May-1976 Today's Date: 09/07/2017    History of Present Illness 42 yo female cmes to ED with complaint of flu-like symptoms of fever, weakness, emesis and generalized weakness; diagnosed with community acquired pneumonia of right lower lobe of lung . H/O depression, HTN SA, polysubstance abuse, in hospital  in recent months for drug overdose    PT Comments    Pt ambulated in hallway and mobilizing well.  Pt reports no SOB however oxygen saturations dropped, and she required supplemental oxygen.  SATURATION QUALIFICATIONS: (This note is used to comply with regulatory documentation for home oxygen)  Patient Saturations on Room Air at Rest = 92%  Patient Saturations on Room Air while Ambulating = 85%  Patient Saturations on 2 Liters of oxygen while Ambulating = 95%  Please briefly explain why patient needs home oxygen: to improve oxygen saturations during physical activity such as ambulation   Follow Up Recommendations  No PT follow up     Equipment Recommendations  None recommended by PT    Recommendations for Other Services       Precautions / Restrictions Precautions Precautions: Fall Precaution Comments: monitor sats Restrictions Weight Bearing Restrictions: No    Mobility  Bed Mobility Overal bed mobility: Independent                Transfers Overall transfer level: Needs assistance Equipment used: None Transfers: Sit to/from Stand Sit to Stand: Supervision            Ambulation/Gait Ambulation/Gait assistance: Supervision Ambulation Distance (Feet): 350 Feet Assistive device: None Gait Pattern/deviations: Step-through pattern;Decreased stride length     General Gait Details: pt pushed IV pole, monitored saturations and pt required supplemental oxygen   Stairs            Wheelchair Mobility    Modified Rankin (Stroke Patients Only)        Balance                                            Cognition Arousal/Alertness: Awake/alert Behavior During Therapy: WFL for tasks assessed/performed Overall Cognitive Status: Within Functional Limits for tasks assessed                                        Exercises      General Comments        Pertinent Vitals/Pain Pain Assessment: Faces Faces Pain Scale: Hurts a little bit Pain Location: no specific c/o pain Pain Intervention(s): Repositioned    Home Living                      Prior Function            PT Goals (current goals can now be found in the care plan section) Progress towards PT goals: Progressing toward goals    Frequency    Min 2X/week      PT Plan Current plan remains appropriate    Co-evaluation              AM-PAC PT "6 Clicks" Daily Activity  Outcome Measure  Difficulty turning over in bed (including adjusting bedclothes, sheets and blankets)?: None Difficulty moving from lying on back to sitting on the side  of the bed? : None Difficulty sitting down on and standing up from a chair with arms (e.g., wheelchair, bedside commode, etc,.)?: None Help needed moving to and from a bed to chair (including a wheelchair)?: A Little Help needed walking in hospital room?: A Little Help needed climbing 3-5 steps with a railing? : A Little 6 Click Score: 21    End of Session Equipment Utilized During Treatment: Oxygen Activity Tolerance: Patient tolerated treatment well Patient left: in bed;with call bell/phone within reach;Other (comment)(telesitter)   PT Visit Diagnosis: Unsteadiness on feet (R26.81)     Time: 6144-3154 PT Time Calculation (min) (ACUTE ONLY): 12 min  Charges:  $Gait Training: 8-22 mins                    G Codes:       Zenovia Jarred, PT, DPT 09/07/2017 Pager: [phone]  Maida Sale E 09/07/2017, 12:11 PM

## 2017-09-07 NOTE — Progress Notes (Addendum)
Pulse oximetry monitoring continuous applied as Dr. Sharl Ma ordered (verbal) at bedside on 09/07/17.

## 2017-09-07 NOTE — Progress Notes (Signed)
Pt. Placed on CPAP for h/s, placed 2 lpm oxygen into circuit, tolerating well.
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
## 2017-09-08 DIAGNOSIS — R112 Nausea with vomiting, unspecified: Secondary | ICD-10-CM

## 2017-09-08 DIAGNOSIS — A419 Sepsis, unspecified organism: Principal | ICD-10-CM

## 2017-09-08 DIAGNOSIS — I959 Hypotension, unspecified: Secondary | ICD-10-CM

## 2017-09-08 LAB — CREATININE, SERUM
CREATININE: 0.76 mg/dL (ref 0.44–1.00)
GFR calc non Af Amer: >60 mL/min (ref >60)

## 2017-09-08 LAB — CULTURE, BLOOD (ROUTINE X 2)
CULTURE: NO GROWTH
CULTURE: NO GROWTH
SPECIAL REQUESTS: ADEQUATE

## 2017-09-08 LAB — CULTURE, RESPIRATORY W GRAM STAIN: Culture: NORMAL

## 2017-09-08 LAB — CULTURE, RESPIRATORY

## 2017-09-08 MED ORDER — IPRATROPIUM BROMIDE HFA 17 MCG/ACT IN AERS: 2.0000 | INHALATION_SPRAY | Freq: Four times a day (QID) | RESPIRATORY_TRACT | 12 refills | Status: DC | PRN

## 2017-09-08 MED ORDER — PREDNISONE 10 MG PO TABS: ORAL_TABLET | ORAL | 0 refills | Status: DC

## 2017-09-08 MED ORDER — IPRATROPIUM-ALBUTEROL 0.5-2.5 (3) MG/3ML IN SOLN
3.0000 mL | Freq: Three times a day (TID) | RESPIRATORY_TRACT | Status: DC
  Administered 2017-09-08: 3 mL via RESPIRATORY_TRACT
  Filled 2017-09-08 (×2): qty 3

## 2017-09-08 MED ORDER — GABAPENTIN 400 MG PO CAPS: 800.0000 mg | ORAL_CAPSULE | Freq: Three times a day (TID) | ORAL | 3 refills | Status: DC

## 2017-09-08 MED ORDER — PROAIR HFA 108 (90 BASE) MCG/ACT IN AERS: 2.0000 | INHALATION_SPRAY | Freq: Four times a day (QID) | RESPIRATORY_TRACT | 12 refills | Status: DC | PRN
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
## 2017-11-05 ENCOUNTER — Encounter (HOSPITAL_COMMUNITY): Payer: Self-pay | Admitting: Emergency Medicine

## 2017-11-05 ENCOUNTER — Emergency Department (HOSPITAL_COMMUNITY)
Admission: EM | Admit: 2017-11-05 | Discharge: 2017-11-06 | Disposition: A | Discharge status: Home / Self Care | Payer: Medicaid Other | Attending: Emergency Medicine | Admitting: Emergency Medicine

## 2017-11-05 ENCOUNTER — Other Ambulatory Visit: Payer: Self-pay

## 2017-11-05 DIAGNOSIS — R45851 Suicidal ideations: Secondary | ICD-10-CM | POA: Insufficient documentation

## 2017-11-05 DIAGNOSIS — R4587 Impulsiveness: Secondary | ICD-10-CM | POA: Diagnosis not present

## 2017-11-05 DIAGNOSIS — I10 Essential (primary) hypertension: Secondary | ICD-10-CM | POA: Diagnosis not present

## 2017-11-05 DIAGNOSIS — F332 Major depressive disorder, recurrent severe without psychotic features: Secondary | ICD-10-CM | POA: Insufficient documentation

## 2017-11-05 DIAGNOSIS — Z79899 Other long term (current) drug therapy: Secondary | ICD-10-CM | POA: Diagnosis not present

## 2017-11-05 DIAGNOSIS — F17210 Nicotine dependence, cigarettes, uncomplicated: Secondary | ICD-10-CM | POA: Insufficient documentation

## 2017-11-05 DIAGNOSIS — F331 Major depressive disorder, recurrent, moderate: Secondary | ICD-10-CM | POA: Diagnosis not present

## 2017-11-05 DIAGNOSIS — F1920 Other psychoactive substance dependence, uncomplicated: Secondary | ICD-10-CM | POA: Diagnosis present

## 2017-11-05 HISTORY — DX: Major depressive disorder, single episode, unspecified: F32.9

## 2017-11-05 HISTORY — DX: Depression, unspecified: F32.A

## 2017-11-05 LAB — CBC WITH DIFFERENTIAL/PLATELET
BASOS ABS: 0.1 10*3/uL (ref 0.0–0.1)
BASOS PCT: 1 %
Eosinophils Absolute: 0.1 10*3/uL (ref 0.0–0.7)
Eosinophils Relative: 2 %
HEMATOCRIT: 42.6 % (ref 36.0–46.0)
HEMOGLOBIN: 14.5 g/dL (ref 12.0–15.0)
Lymphocytes Relative: 40 %
Lymphs Abs: 2.6 10*3/uL (ref 0.7–4.0)
MCH: 32.3 pg (ref 26.0–34.0)
MCHC: 34 g/dL (ref 30.0–36.0)
MCV: 94.9 fL (ref 78.0–100.0)
Monocytes Absolute: 0.4 10*3/uL (ref 0.1–1.0)
Monocytes Relative: 7 %
NEUTROS ABS: 3.2 10*3/uL (ref 1.7–7.7)
Neutrophils Relative %: 50 %
Platelets: 209 10*3/uL (ref 150–400)
RBC: 4.49 MIL/uL (ref 3.87–5.11)
RDW: 13.1 % (ref 11.5–15.5)
WBC: 6.5 10*3/uL (ref 4.0–10.5)

## 2017-11-05 LAB — ETHANOL

## 2017-11-05 LAB — COMPREHENSIVE METABOLIC PANEL
ALT: 80 U/L — ABNORMAL HIGH (ref 14–54)
ANION GAP: 10 (ref 5–15)
AST: 81 U/L — ABNORMAL HIGH (ref 15–41)
Albumin: 3.7 g/dL (ref 3.5–5.0)
Alkaline Phosphatase: 65 U/L (ref 38–126)
BILIRUBIN TOTAL: 0.7 mg/dL (ref 0.3–1.2)
BUN: 17 mg/dL (ref 6–20)
CO2: 23 mmol/L (ref 22–32)
Calcium: 9.6 mg/dL (ref 8.9–10.3)
Chloride: 106 mmol/L (ref 101–111)
Creatinine, Ser: 1.03 mg/dL — ABNORMAL HIGH (ref 0.44–1.00)
Glucose, Bld: 87 mg/dL (ref 65–99)
POTASSIUM: 3.7 mmol/L (ref 3.5–5.1)
Sodium: 139 mmol/L (ref 135–145)
TOTAL PROTEIN: 7.8 g/dL (ref 6.5–8.1)

## 2017-11-05 LAB — ACETAMINOPHEN LEVEL: Acetaminophen (Tylenol), Serum: <10 ug/mL — ABNORMAL LOW (ref 10–30)

## 2017-11-05 LAB — I-STAT BETA HCG BLOOD, ED (MC, WL, AP ONLY): I-stat hCG, quantitative: <5 m[IU]/mL (ref <5)

## 2017-11-05 LAB — SALICYLATE LEVEL

## 2017-11-05 MED ORDER — OLANZAPINE 10 MG PO TBDP: 10.0000 mg | ORAL_TABLET | Freq: Three times a day (TID) | ORAL | Status: DC | PRN

## 2017-11-05 MED ORDER — GABAPENTIN 400 MG PO CAPS
800.0000 mg | ORAL_CAPSULE | Freq: Three times a day (TID) | ORAL | Status: DC
Start: 2017-11-05 — End: 2017-11-06
  Administered 2017-11-05 – 2017-11-06 (×4): 800 mg via ORAL
  Filled 2017-11-05 (×4): qty 2

## 2017-11-05 MED ORDER — BUPRENORPHINE HCL 8 MG SL SUBL: 8.0000 mg | SUBLINGUAL_TABLET | Freq: Two times a day (BID) | SUBLINGUAL | Status: DC

## 2017-11-05 MED ORDER — CLONIDINE HCL 0.1 MG PO TABS
0.1000 mg | ORAL_TABLET | Freq: Four times a day (QID) | ORAL | Status: DC
  Administered 2017-11-05 – 2017-11-06 (×4): 0.1 mg via ORAL
  Filled 2017-11-05 (×4): qty 1

## 2017-11-05 MED ORDER — ONDANSETRON 4 MG PO TBDP: 4.0000 mg | ORAL_TABLET | Freq: Four times a day (QID) | ORAL | Status: DC | PRN

## 2017-11-05 MED ORDER — NICOTINE 21 MG/24HR TD PT24
21.0000 mg | MEDICATED_PATCH | Freq: Every day | TRANSDERMAL | Status: DC
  Administered 2017-11-05 – 2017-11-06 (×2): 21 mg via TRANSDERMAL
  Filled 2017-11-05 (×2): qty 1

## 2017-11-05 MED ORDER — ALBUTEROL SULFATE HFA 108 (90 BASE) MCG/ACT IN AERS: 2.0000 | INHALATION_SPRAY | Freq: Four times a day (QID) | RESPIRATORY_TRACT | Status: DC | PRN

## 2017-11-05 MED ORDER — IBUPROFEN 800 MG PO TABS: 800.0000 mg | ORAL_TABLET | Freq: Three times a day (TID) | ORAL | Status: DC | PRN

## 2017-11-05 MED ORDER — DICYCLOMINE HCL 20 MG PO TABS: 20.0000 mg | ORAL_TABLET | Freq: Four times a day (QID) | ORAL | Status: DC | PRN

## 2017-11-05 MED ORDER — AMPHETAMINE-DEXTROAMPHET ER 10 MG PO CP24
30.0000 mg | ORAL_CAPSULE | Freq: Two times a day (BID) | ORAL | Status: DC
  Administered 2017-11-05: 30 mg via ORAL
  Filled 2017-11-05: qty 6

## 2017-11-05 MED ORDER — ZIPRASIDONE MESYLATE 20 MG IM SOLR: 20.0000 mg | Freq: Two times a day (BID) | INTRAMUSCULAR | Status: DC | PRN

## 2017-11-05 MED ORDER — LORAZEPAM 1 MG PO TABS: 1.0000 mg | ORAL_TABLET | ORAL | Status: DC | PRN

## 2017-11-05 MED ORDER — CLONIDINE HCL 0.1 MG PO TABS: 0.1000 mg | ORAL_TABLET | ORAL | Status: DC

## 2017-11-05 MED ORDER — ZOLPIDEM TARTRATE 5 MG PO TABS: 5.0000 mg | ORAL_TABLET | Freq: Every evening | ORAL | Status: DC | PRN

## 2017-11-05 MED ORDER — BUPRENORPHINE HCL-NALOXONE HCL 8-2 MG SL SUBL: 1.0000 | SUBLINGUAL_TABLET | Freq: Two times a day (BID) | SUBLINGUAL | Status: DC

## 2017-11-05 MED ORDER — SERTRALINE HCL 50 MG PO TABS
50.0000 mg | ORAL_TABLET | Freq: Every day | ORAL | Status: DC
  Administered 2017-11-06: 50 mg via ORAL
  Filled 2017-11-05: qty 1

## 2017-11-05 MED ORDER — METHOCARBAMOL 500 MG PO TABS
500.0000 mg | ORAL_TABLET | Freq: Three times a day (TID) | ORAL | Status: DC | PRN
  Administered 2017-11-05: 500 mg via ORAL
  Filled 2017-11-05 (×2): qty 1

## 2017-11-05 MED ORDER — HYDROXYZINE HCL 25 MG PO TABS
25.0000 mg | ORAL_TABLET | Freq: Three times a day (TID) | ORAL | Status: DC
  Administered 2017-11-05 – 2017-11-06 (×3): 25 mg via ORAL
  Filled 2017-11-05 (×3): qty 1

## 2017-11-05 MED ORDER — HYDROXYZINE HCL 25 MG PO TABS: 25.0000 mg | ORAL_TABLET | Freq: Four times a day (QID) | ORAL | Status: DC | PRN

## 2017-11-05 MED ORDER — NAPROXEN 500 MG PO TABS
500.0000 mg | ORAL_TABLET | Freq: Two times a day (BID) | ORAL | Status: DC | PRN
  Administered 2017-11-05: 500 mg via ORAL
  Filled 2017-11-05: qty 1

## 2017-11-05 MED ORDER — LOPERAMIDE HCL 2 MG PO CAPS: 2.0000 mg | ORAL_CAPSULE | ORAL | Status: DC | PRN

## 2017-11-05 MED ORDER — CLONIDINE HCL 0.1 MG PO TABS: 0.1000 mg | ORAL_TABLET | Freq: Every day | ORAL | Status: DC

## 2017-11-05 MED ORDER — TRAZODONE HCL 50 MG PO TABS
50.0000 mg | ORAL_TABLET | Freq: Every evening | ORAL | Status: DC | PRN
  Administered 2017-11-05: 50 mg via ORAL
  Filled 2017-11-05: qty 1

## 2017-11-05 MED ORDER — OXYCODONE HCL 5 MG PO TABS: 10.0000 mg | ORAL_TABLET | Freq: Four times a day (QID) | ORAL | Status: DC | PRN

## 2017-11-05 NOTE — ED Provider Notes (Signed)
[facility]-EMERGENCY DEPT Provider Note   CSN: [id number] Arrival date & time: 11/05/17  0606     History   Chief Complaint Chief Complaint  Patient presents with  . Suicidal  . Multiple Complaints    HPI Judy Robles is a 42 y.o. female.  HPI  42 year old female comes in with chief complaint of depression and suicidal ideations.  Patient states that she stopped taking her medication, Zoloft, few days ago because she did not think they were working.  After she stopped taking the medication her symptoms have gotten worse.  Patient did not attempt did not attempt to harm herself prior to ED arrival.  Patient admits to suicidal attempt in the past.  Patient denies any substance abuse.  In addition patient is also complaining of leg pain, which is chronic in nature.  Patient is also having some chest tightness which she attributes to anxiety.  Patient does not have any sig medical history.  Past Medical History:  Diagnosis Date  . Arthritis   . Depression   . Hypertension   . MRSA (methicillin resistant Staphylococcus aureus)   . Obesity   . Sleep apnea     Patient Active Problem List   Diagnosis Date Noted  . Community acquired pneumonia of right lower lobe of lung (HCC) 09/02/2017  . Sepsis associated hypotension (HCC) 09/02/2017  . AKI (acute kidney injury) (HCC) 09/02/2017  . Nausea and vomiting 09/02/2017  . Drug overdose 04/22/2017  . Chest pain 10/09/2016  . Pressure injury of skin 10/05/2016  . Polysubstance dependence including opioid type drug with complication, episodic abuse (HCC) 07/21/2016  . MDD (major depressive disorder), recurrent severe, without psychosis (HCC) 07/21/2016  . Chronic pain 07/03/2015  . Diarrhea 05/01/2014  . Essential hypertension, benign 04/17/2014    Past Surgical History:  Procedure Laterality Date  . I&D EXTREMITY Right 04/16/2014   Procedure: IRRIGATION AND DEBRIDEMENT EXTREMITY;  Surgeon: Sharma Covert, MD;  Location: [facility] OR;  Service: Orthopedics;  Laterality: Right;    OB History    No data available       Home Medications    Prior to Admission medications   Medication Sig Start Date End Date Taking? Authorizing Provider  ADDERALL XR 30 MG 24 hr capsule Take 30 mg by mouth 2 (two) times daily.  11/14/16  Yes [provider]  ALPRAZolam Prudy Feeler) 1 MG tablet Take 1 mg by mouth 3 (three) times daily. 09/30/16  Yes [provider]  gabapentin (NEURONTIN) 400 MG capsule Take 2 capsules (800 mg total) by mouth 3 (three) times daily. Patient taking differently: Take 800 mg by mouth 4 (four) times daily.  09/08/17  Yes Meredeth Ide, MD  ibuprofen (ADVIL,MOTRIN) 800 MG tablet Take 800 mg by mouth 3 (three) times daily as needed for pain. 11/11/16  Yes [provider]  ipratropium (ATROVENT HFA) 17 MCG/ACT inhaler Inhale 2 puffs into the lungs every 6 (six) hours as needed for wheezing. 09/08/17  Yes Meredeth Ide, MD  oxyCODONE (ROXICODONE) 15 MG immediate release tablet Take 15 mg by mouth 4 (four) times daily.   Yes [provider]  PROAIR HFA 108 (90 Base) MCG/ACT inhaler Inhale 2 puffs into the lungs every 6 (six) hours as needed for wheezing or shortness of breath. 09/08/17  Yes Meredeth Ide, MD  sertraline (ZOLOFT) 100 MG tablet Take 100 mg by mouth daily. 10/02/17  Yes [provider]  Buprenorphine HCl-Naloxone HCl (SUBOXONE) 8-2  MG FILM Place 1 Film under the tongue 2 (two) times daily. Patient not taking: Reported on 11/05/2017 10/07/16   Tyson Alias, MD  predniSONE (DELTASONE) 10 MG tablet Prednisone 40 mg po daily x 1 day then Prednisone 30 mg po daily x 1 day then Prednisone 20 mg po daily x 1 day then Prednisone 10 mg daily x 1 day then stop... Patient not taking: Reported on 11/05/2017 09/08/17   Meredeth Ide, MD  senna-docusate (SENOKOT-S) 8.6-50 MG tablet Take 2 tablets by mouth 2 (two) times daily. Patient not taking:  Reported on 11/05/2017 10/08/16   Nyra Market, MD    Family History Family History  Problem Relation Age of Onset  . Throat cancer Mother   . Hypertension Father   . Stroke Father     Social History Social History   Tobacco Use  . Smoking status: Current Every Day Smoker    Packs/day: 1.00    Types: Cigarettes  . Smokeless tobacco: Never Used  . Tobacco comment: 1/2PPD  Substance Use Topics  . Alcohol use: No  . Drug use: No    Comment: "crack" hx; Pt denied current use     Allergies   Tylenol [acetaminophen] and Lisinopril   Review of Systems Review of Systems  Constitutional: Positive for activity change.  Respiratory: Negative for chest tightness.   Cardiovascular: Negative for chest pain.  Gastrointestinal: Negative for abdominal pain.  Psychiatric/Behavioral: Positive for behavioral problems and suicidal ideas. Negative for agitation.  All other systems reviewed and are negative.    Physical Exam Updated Vital Signs BP (!) 129/48   Pulse (!) 58   Temp 98.3 F (36.8 C) (Oral)   Resp 20   SpO2 97%   Physical Exam  Constitutional: She is oriented to person, place, and time. She appears well-developed.  HENT:  Head: Normocephalic and atraumatic.  Eyes: EOM are normal.  Neck: Normal range of motion. Neck supple.  Cardiovascular: Normal rate.  Pulmonary/Chest: Effort normal.  Abdominal: Bowel sounds are normal.  Neurological: She is alert and oriented to person, place, and time.  Skin: Skin is warm and dry.  Psychiatric:  Suicidal ideation  Nursing note and vitals reviewed.    ED Treatments / Results  Labs (all labs ordered are listed, but only abnormal results are displayed) Labs Reviewed  COMPREHENSIVE METABOLIC PANEL - Abnormal; Notable for the following components:      Result Value   Creatinine, Ser 1.03 (*)    AST 81 (*)    ALT 80 (*)    All other components within normal limits  ACETAMINOPHEN LEVEL - Abnormal; Notable for the  following components:   Acetaminophen (Tylenol), Serum <10 (*)    All other components within normal limits  ETHANOL  CBC WITH DIFFERENTIAL/PLATELET  SALICYLATE LEVEL  RAPID URINE DRUG SCREEN, HOSP PERFORMED  I-STAT BETA HCG BLOOD, ED ([facility], [facility], [facility] ONLY)  I-STAT BETA HCG BLOOD, ED ([facility], [facility], [facility] ONLY)    EKG  EKG Interpretation  Date/Time:  Sunday November 05 2017 06:16:41 EDT Ventricular Rate:  73 PR Interval:    QRS Duration: 89 QT Interval:  406 QTC Calculation: 448 R Axis:   48 Text Interpretation:  Sinus rhythm Prominent P waves, nondiagnostic Minimal ST depression, inferior leads No acute changes No significant change since last tracing Confirmed by Derwood Kaplan [id number]) on 11/05/2017 8:38:24 AM       Radiology No results found.  Procedures Procedures (including critical care time)  Medications Ordered  in ED Medications  ibuprofen (ADVIL,MOTRIN) tablet 800 mg (not administered)  oxyCODONE (Oxy IR/ROXICODONE) immediate release tablet 10 mg (not administered)  amphetamine-dextroamphetamine (ADDERALL XR) 24 hr capsule 30 mg (30 mg Oral Given 11/05/17 0942)  gabapentin (NEURONTIN) capsule 800 mg (800 mg Oral Given 11/05/17 0915)  albuterol (PROVENTIL HFA;VENTOLIN HFA) 108 (90 Base) MCG/ACT inhaler 2 puff (not administered)  zolpidem (AMBIEN) tablet 5 mg (not administered)  nicotine (NICODERM CQ - dosed in mg/24 hours) patch 21 mg (21 mg Transdermal Patch Applied 11/05/17 0918)  ziprasidone (GEODON) injection 20 mg (not administered)    And  LORazepam (ATIVAN) tablet 1 mg (not administered)     Initial Impression / Assessment and Plan / ED Course  I have reviewed the triage vital signs and the nursing notes.  Pertinent labs & imaging results that were available during my care of the patient were reviewed by me and considered in my medical decision making (see chart for details).     Pt with hx of depression comes in with cc of SI.  DDx: Depression Substance  abuse Suicidal ideation  Pt is medically cleared for psych evaluation. No SI. Narcotic database reveals that pt is not on chronic pain meds anymore and she is on suboxone.  Final Clinical Impressions(s) / ED Diagnoses   Final diagnoses:  Suicidal ideation  Severe episode of recurrent major depressive disorder, without psychotic features [facility])  Suicidal ideations    ED Discharge Orders    None       Derwood Kaplan, MD 11/05/17 [phone]

## 2017-11-05 NOTE — ED Notes (Signed)
Pt to go to room 37 after dressing out and wanded

## 2017-11-05 NOTE — BH Assessment (Signed)
Assessment Note  Judy Robles is a 42 y.o. female who presented to [facility] on voluntary basis with complaint of suicidal ideation, auditory hallucination (hearing her name being called), depressive symptoms.  Pt was last assessed by TTS in December 2017.  At that time, she was admitted to [facility] for treatment of depression.  Pt is unemployed due to foot injury.  She lives with a friend and is seeking disability.  Pt does not have any outpatient psychiatric/therapy services.  Pt reported as follows:  She is prescribed Zoloft by her PCP.  Last week, she abruptly stopped taking it because she did not believe it was working.  Since then, Pt said, she has experienced an increase the following symptoms:  Suicidal ideation without plan or intent; despondency; insomnia (3 hours per night); hallucination (hearing her name being called); feelings of worthlessness.  Pt also endorsed stressors, including decline in physical health (pain in foot).  Pt denied visual hallucination, homicidal ideation, current substance use, and self-injurious behavior.  Pt requested inpatient care.  During assessment, Pt presented as alert and oriented.  She had good eye contact and was cooperative.  Pt was dressed in scrubs and appeared appropriately groomed.  Pt's mood was sad.  Affect was mood-congruent.  Pt endorsed suicidal ideation and other depressive symptoms.  Pt's speech was normal in rate, rhythm, and volume.  Pt's thought processes were within normal range, and thought content was logical.  Pt's memory and concentration were intact.  Insight, judgment, and impulse control were fair.  Consulted with Irving Burton, NP, who determined that Pt should remain in hospital, be re-started on medication, stabilized, and re-evaluated in AM.  Diagnosis: 33.2 MDD, Recurrent, Severe w/psychotic features  Past Medical History:  Past Medical History:  Diagnosis Date  . Arthritis   . Depression   . Hypertension   . MRSA (methicillin resistant  Staphylococcus aureus)   . Obesity   . Sleep apnea     Past Surgical History:  Procedure Laterality Date  . I&D EXTREMITY Right 04/16/2014   Procedure: IRRIGATION AND DEBRIDEMENT EXTREMITY;  Surgeon: Sharma Covert, MD;  Location: [facility] OR;  Service: Orthopedics;  Laterality: Right;    Family History:  Family History  Problem Relation Age of Onset  . Throat cancer Mother   . Hypertension Father   . Stroke Father     Social History:  reports that she has been smoking cigarettes.  She has been smoking about 1.00 pack per day. she has never used smokeless tobacco. She reports that she does not drink alcohol or use drugs.  Additional Social History:  Alcohol / Drug Use Pain Medications: See MAR Prescriptions: See MAR Over the Counter: See MAr History of alcohol / drug use?: Yes(Pt endorsed past use of crack; no current use; UDS NA)  CIWA: CIWA-Ar BP: (!) 129/48 Pulse Rate: (!) 58 COWS:    Allergies:  Allergies  Allergen Reactions  . Tylenol [Acetaminophen] Itching and Other (See Comments)    Welts  . Lisinopril Other (See Comments)    Shuts down kidneys    Home Medications:  (Not in a hospital admission)  OB/GYN Status:  No LMP recorded. Patient is not currently having periods (Reason: Irregular Periods).  General Assessment Data Location of Assessment: [facility] ED TTS Assessment: In system Is this a Tele or Face-to-Face Assessment?: Face-to-Face Is this an Initial Assessment or a Re-assessment for this encounter?: Initial Assessment Marital status: Single Is patient pregnant?: No Pregnancy Status: No Living Arrangements: Non-relatives/Friends Can  pt return to current living arrangement?: Yes Admission Status: Voluntary Is patient capable of signing voluntary admission?: Yes Referral Source: Self/Family/Friend Insurance type: [place] MCD     Crisis Care Plan Living Arrangements: Non-relatives/Friends Name of Psychiatrist: None Name of Therapist: None  Education  Status Is patient currently in school?: No Is the patient employed, unemployed or receiving disability?: Unemployed  Risk to self with the past 6 months Suicidal Ideation: Yes-Currently Present Has patient been a risk to self within the past 6 months prior to admission? : No Suicidal Intent: No Has patient had any suicidal intent within the past 6 months prior to admission? : No Is patient at risk for suicide?: No Suicidal Plan?: No Has patient had any suicidal plan within the past 6 months prior to admission? : No Access to Means: No What has been your use of drugs/alcohol within the last 12 months?: Pt denied recent use Previous Attempts/Gestures: No Intentional Self Injurious Behavior: None Family Suicide History: No Recent stressful life event(s): Recent negative physical changes(Foot pain; foot needs surgery) Persecutory voices/beliefs?: No Depression: Yes Depression Symptoms: Despondent, Insomnia, Isolating, Feeling worthless/self pity Substance abuse history and/or treatment for substance abuse?: Yes Suicide prevention information given to non-admitted patients: Not applicable  Risk to Others within the past 6 months Homicidal Ideation: No Does patient have any lifetime risk of violence toward others beyond the six months prior to admission? : No Thoughts of Harm to Others: No Current Homicidal Intent: No Current Homicidal Plan: No Access to Homicidal Means: No History of harm to others?: No Assessment of Violence: None Noted Does patient have access to weapons?: No Criminal Charges Pending?: No Does patient have a court date: No Is patient on probation?: No  Psychosis Hallucinations: Auditory(Reports hearing voices occasionally) Delusions: None noted  Mental Status Report Appearance/Hygiene: Unremarkable, In scrubs Eye Contact: Fair Motor Activity: Freedom of movement, Unremarkable Speech: Logical/coherent Level of Consciousness: Alert Mood: Sad,  Helpless Affect: Appropriate to circumstance Anxiety Level: None Thought Processes: Relevant, Coherent Judgement: Partial Orientation: Person, Place, Time, Situation Obsessive Compulsive Thoughts/Behaviors: None  Cognitive Functioning Concentration: Normal Memory: Recent Intact, Remote Intact Is patient IDD: No Is patient DD?: No Insight: Fair Impulse Control: Good Appetite: Good Have you had any weight changes? : No Change Sleep: Decreased Total Hours of Sleep: 3 Vegetative Symptoms: None  ADLScreening [facility] Assessment Services) Patient's cognitive ability adequate to safely complete daily activities?: Yes Patient able to express need for assistance with ADLs?: Yes Independently performs ADLs?: Yes (appropriate for developmental age)  Prior Inpatient Therapy Prior Inpatient Therapy: Yes Prior Therapy Dates: 2017 Prior Therapy Facilty/Provider(s): [facility]; [place] Reason for Treatment: Depression  Prior Outpatient Therapy Prior Outpatient Therapy: No Does patient have an ACCT team?: No Does patient have Intensive In-House Services?  : No Does patient have [facility] services? : No Does patient have P4CC services?: No  ADL Screening (condition at time of admission) Patient's cognitive ability adequate to safely complete daily activities?: Yes Is the patient deaf or have difficulty hearing?: No Does the patient have difficulty seeing, even when wearing glasses/contacts?: No Does the patient have difficulty concentrating, remembering, or making decisions?: No Patient able to express need for assistance with ADLs?: Yes Does the patient have difficulty dressing or bathing?: No Independently performs ADLs?: Yes (appropriate for developmental age) Does the patient have difficulty walking or climbing stairs?: No Weakness of Legs: None Weakness of Arms/Hands: None  Home Assistive Devices/Equipment Home Assistive Devices/Equipment: None  Therapy Consults (therapy consults  require  a physician order) PT Evaluation Needed: No OT Evalulation Needed: No SLP Evaluation Needed: No Abuse/Neglect Assessment (Assessment to be complete while patient is alone) Abuse/Neglect Assessment Can Be Completed: Yes Physical Abuse: Denies Verbal Abuse: Denies Sexual Abuse: Denies Exploitation of patient/patient's resources: Denies Self-Neglect: Denies Values / Beliefs Cultural Requests During Hospitalization: None Spiritual Requests During Hospitalization: None Consults Spiritual Care Consult Needed: No Social Work Consult Needed: No Merchant navy officer (For Healthcare) Does Patient Have a Medical Advance Directive?: No Would patient like information on creating a medical advance directive?: No - Patient declined    Additional Information 1:1 In Past 12 Months?: No CIRT Risk: No Elopement Risk: No Does patient have medical clearance?: Yes     Disposition:  Disposition Initial Assessment Completed for this Encounter: Yes Patient referred to: Other (Comment)(Start meds, stabilize, re-eval in AM)  On Site Evaluation by:   Reviewed with Physician:    Dorris Fetch Mariyanna Mucha 11/05/2017 9:08 AM

## 2017-11-05 NOTE — ED Triage Notes (Signed)
Pt comes in complaining of suicidal ideation, chest wall pain, leg pain and multiple other complaints. Per EMS, 12 lead normal. Patient does not have a plan.

## 2017-11-05 NOTE — ED Notes (Signed)
Bed: [facility] Expected date: 11/05/17 Expected time: 9:20 AM Means of arrival: Other Comments: Held for room 17

## 2017-11-05 NOTE — ED Notes (Signed)
Pt A&O x 3, no distress noted, resting at present.  Monitoring for safety, Q 15 min checks in effect. 

## 2017-11-06 DIAGNOSIS — R4587 Impulsiveness: Secondary | ICD-10-CM | POA: Diagnosis not present

## 2017-11-06 DIAGNOSIS — F331 Major depressive disorder, recurrent, moderate: Secondary | ICD-10-CM | POA: Diagnosis not present

## 2017-11-06 DIAGNOSIS — F17210 Nicotine dependence, cigarettes, uncomplicated: Secondary | ICD-10-CM | POA: Diagnosis not present

## 2017-11-06 MED ORDER — PAROXETINE HCL 20 MG PO TABS
20.0000 mg | ORAL_TABLET | Freq: Every day | ORAL | Status: DC
  Filled 2017-11-06: qty 1

## 2017-11-06 NOTE — [facility] Suicide Risk Assessment (Signed)
Suicide Risk Assessment  Discharge Assessment   [facility] Discharge Suicide Risk Assessment   Principal Problem: Major depressive disorder, recurrent episode, moderate (HCC) Discharge Diagnoses:  Patient Active Problem List   Diagnosis Date Noted  . Polysubstance dependence including opioid type drug with complication, episodic abuse (HCC) [F19.20] 07/21/2016    Priority: High  . MDD (major depressive disorder), recurrent severe, without psychosis (HCC) [F33.2] 07/21/2016    Priority: High  . Major depressive disorder, recurrent episode, moderate (HCC) [F33.1] 11/05/2017  . Community acquired pneumonia of right lower lobe of lung (HCC) [J18.1] 09/02/2017  . Sepsis associated hypotension (HCC) [A41.9, I95.9] 09/02/2017  . AKI (acute kidney injury) (HCC) [N17.9] 09/02/2017  . Nausea and vomiting [R11.2] 09/02/2017  . Drug overdose [T50.901A] 04/22/2017  . Chest pain [R07.9] 10/09/2016  . Pressure injury of skin [L89.90] 10/05/2016  . Chronic pain [G89.29] 07/03/2015  . Diarrhea [R19.7] 05/01/2014  . Essential hypertension, benign [I10] 04/17/2014    Total Time spent with patient: 45 minutes  Musculoskeletal: Strength & Muscle Tone: within normal limits Gait & Station: normal Patient leans: N/A  Psychiatric Specialty Exam: Physical Exam  Constitutional: She is oriented to person, place, and time. She appears well-developed and well-nourished.  HENT:  Head: Normocephalic.  Respiratory: Effort normal.  Musculoskeletal: Normal range of motion.  Neurological: She is alert and oriented to person, place, and time.  Psychiatric: Her speech is normal and behavior is normal. Thought content normal. Cognition and memory are normal. She expresses impulsivity. She exhibits a depressed mood.   Review of Systems  Psychiatric/Behavioral: Positive for depression. Negative for hallucinations, memory loss, substance abuse and suicidal ideas. The patient is not nervous/anxious and does not have  insomnia.   All other systems reviewed and are negative.  Blood pressure 105/64, pulse 70, temperature 98.2 F (36.8 C), temperature source Oral, resp. rate 16, SpO2 97 %.There is no height or weight on file to calculate BMI. General Appearance: Casual Eye Contact:  Good Speech:  Clear and Coherent and Normal Rate Volume:  Normal Mood:  Depressed Affect:  Congruent and Depressed Thought Process:  Coherent, Goal Directed and Linear Orientation:  Full (Time, Place, and Person) Thought Content:  Logical Suicidal Thoughts:  No Homicidal Thoughts:  No Memory:  Immediate;   Good Recent;   Good Remote;   Fair Judgement:  Fair Insight:  Fair Psychomotor Activity:  Normal Concentration:  Concentration: Good and Attention Span: Good Recall:  Good Fund of Knowledge:  Good Language:  Good Akathisia:  No Handed:  Right AIMS (if indicated):    Assets:  Communication Skills ADL's:  Intact Cognition:  WNL   Mental Status Per Nursing Assessment::   On Admission:   Depressed  Demographic Factors:  Caucasian, Low socioeconomic status and Unemployed  Loss Factors: Financial problems/change in socioeconomic status  Historical Factors: Impulsivity  Risk Reduction Factors:   Sense of responsibility to family  Continued Clinical Symptoms:  Depression:   Impulsivity Alcohol/Substance Abuse/Dependencies  Cognitive Features That Contribute To Risk:  Closed-mindedness    Suicide Risk:  Minimal: No identifiable suicidal ideation.  Patients presenting with no risk factors but with morbid ruminations; may be classified as minimal risk based on the severity of the depressive symptoms    Plan Of Care/Follow-up recommendations:  Activity:  as tolerated Diet:  Heart Healthy  Laveda Abbe, NP 11/06/2017, 12:57 PM

## 2017-11-06 NOTE — BH Assessment (Signed)
[facility] Assessment Progress Note  Per Juanetta Beets, DO, this pt does not require psychiatric hospitalization at this time.  Pt is to be discharged from [facility] with recommendation to follow up with [facility].  This has been included in pt's discharge instructions.  Pt's nurse, Carlisle Beers, has been notified.  Doylene Canning, MA Triage Specialist [phone]

## 2017-11-06 NOTE — Consult Note (Addendum)
[facility] Consult   Reason for Consult:  Suicidal ideation Referring Physician:  EDP Patient Identification: Judy Robles MRN:  [id number] Principal Diagnosis: Major depressive disorder, recurrent episode, moderate ([place]) Diagnosis:   Patient Active Problem List   Diagnosis Date Noted  . Polysubstance dependence including opioid type drug with complication, episodic abuse ([place]) [F19.20] 07/21/2016    Priority: High  . MDD (major depressive disorder), recurrent severe, without psychosis ([place]) [F33.2] 07/21/2016    Priority: High  . Major depressive disorder, recurrent episode, moderate ([place]) [F33.1] 11/05/2017  . Community acquired pneumonia of right lower lobe of lung ([place]) [J18.1] 09/02/2017  . Sepsis associated hypotension ([place]) [A41.9, I95.9] 09/02/2017  . AKI (acute kidney injury) ([place]) [N17.9] 09/02/2017  . Nausea and vomiting [R11.2] 09/02/2017  . Drug overdose [T50.901A] 04/22/2017  . Chest pain [R07.9] 10/09/2016  . Pressure injury of skin [L89.90] 10/05/2016  . Chronic pain [G89.29] 07/03/2015  . Diarrhea [R19.7] 05/01/2014  . Essential hypertension, benign [I10] 04/17/2014    Total Time spent with patient: 45 minutes  Subjective:   Judy Robles is a 42 y.o. female patient admitted with suicidal ideation.  HPI:  Pt was seen and chart reviewed with treatment team and Dr Mariea Clonts. Pt denies suicidal/homicidal ideation, denies auditory/visual hallucinations and does not appear to be responding to internal stimuli. Pt stated she is depressed and stopped taking her Zoloft one week ago because she felt like it wasn't working. Pt is willing to try Paxil again which she has been on in the past. Pt stated she takes Suboxone and Ativan but has not had it filled since Oct 01, 2017. Pt's BAL negative but Pt has not given a urine specimen since admission so results of UDS are not known. Pt stated she will go see her Dr on discharge and get back on her Suboxone. Pt was  informed that Suboxone is not prescribed in the emergency room. Pt is able to contract for safety upon discharge. Pt is psychiatrically clear for discharge.  Past Psychiatric History: As above  Risk to Self: None Risk to Others: none Prior Inpatient Therapy: Prior Inpatient Therapy: Yes Prior Therapy Dates: 2017 Prior Therapy Facilty/Provider(s): [facility]; [place] Reason for Treatment: Depression Prior Outpatient Therapy: Prior Outpatient Therapy: No Does patient have an ACCT team?: No Does patient have Intensive In-House Services?  : No Does patient have [facility] services? : No Does patient have P4CC services?: No  Past Medical History:  Past Medical History:  Diagnosis Date  . Arthritis   . Depression   . Hypertension   . MRSA (methicillin resistant Staphylococcus aureus)   . Obesity   . Sleep apnea     Past Surgical History:  Procedure Laterality Date  . I&D EXTREMITY Right 04/16/2014   Procedure: IRRIGATION AND DEBRIDEMENT EXTREMITY;  Surgeon: Linna Hoff, MD;  Location: [facility];  Service: Orthopedics;  Laterality: Right;   Family History:  Family History  Problem Relation Age of Onset  . Throat cancer Mother   . Hypertension Father   . Stroke Father    Family Psychiatric  History: Unknown Social History:  Social History   Substance and Sexual Activity  Alcohol Use No     Social History   Substance and Sexual Activity  Drug Use No   Comment: "crack" hx; Pt denied current use    Social History   Socioeconomic History  . Marital status: Single    Spouse name: None  . Number of children: None  .  Years of education: None  . Highest education level: None  Social Needs  . Financial resource strain: None  . Food insecurity - worry: None  . Food insecurity - inability: None  . Transportation needs - medical: None  . Transportation needs - non-medical: None  Occupational History  . None  Tobacco Use  . Smoking status: Current Every Day Smoker    Packs/day:  1.00    Types: Cigarettes  . Smokeless tobacco: Never Used  . Tobacco comment: 1/2PPD  Substance and Sexual Activity  . Alcohol use: No  . Drug use: No    Comment: "crack" hx; Pt denied current use  . Sexual activity: Yes    Birth control/protection: Surgical  Other Topics Concern  . None  Social History Narrative  . None   Additional Social History: N/A    Allergies:   Allergies  Allergen Reactions  . Tylenol [Acetaminophen] Itching and Other (See Comments)    Welts  . Lisinopril Other (See Comments)    Shuts down kidneys    Labs:  Results for orders placed or performed during the hospital encounter of 11/05/17 (from the past 48 hour(s))  Comprehensive metabolic panel     Status: Abnormal   Collection Time: 11/05/17  8:04 AM  Result Value Ref Range   Sodium 139 135 - 145 mmol/L   Potassium 3.7 3.5 - 5.1 mmol/L   Chloride 106 101 - 111 mmol/L   CO2 23 22 - 32 mmol/L   Glucose, Bld 87 65 - 99 mg/dL   BUN 17 6 - 20 mg/dL   Creatinine, Ser 1.03 (H) 0.44 - 1.00 mg/dL   Calcium 9.6 8.9 - 10.3 mg/dL   Total Protein 7.8 6.5 - 8.1 g/dL   Albumin 3.7 3.5 - 5.0 g/dL   AST 81 (H) 15 - 41 U/L   ALT 80 (H) 14 - 54 U/L   Alkaline Phosphatase 65 38 - 126 U/L   Total Bilirubin 0.7 0.3 - 1.2 mg/dL   GFR calc non Af Amer >60 >60 mL/min   GFR calc Af Amer >60 >60 mL/min    Comment: (NOTE) The eGFR has been calculated using the CKD EPI equation. This calculation has not been validated in all clinical situations. eGFR's persistently <60 mL/min signify possible Chronic Kidney Disease.    Anion gap 10 5 - 15    Comment: Performed at [facility], [place] [address], [place], [place] [address]  Ethanol     Status: None   Collection Time: 11/05/17  8:04 AM  Result Value Ref Range   Alcohol, Ethyl (B) <10 <10 mg/dL    Comment:        LOWEST DETECTABLE LIMIT FOR SERUM ALCOHOL IS 10 mg/dL FOR MEDICAL PURPOSES ONLY Performed at [facility]  [address], [place], [place] [address]   CBC with Diff     Status: None   Collection Time: 11/05/17  8:04 AM  Result Value Ref Range   WBC 6.5 4.0 - 10.5 K/uL   RBC 4.49 3.87 - 5.11 MIL/uL   Hemoglobin 14.5 12.0 - 15.0 g/dL   HCT 42.6 36.0 - 46.0 %   MCV 94.9 78.0 - 100.0 fL   MCH 32.3 26.0 - 34.0 pg   MCHC 34.0 30.0 - 36.0 g/dL   RDW 13.1 11.5 - 15.5 %   Platelets 209 150 - 400 K/uL   Neutrophils Relative % 50 %   Neutro Abs 3.2 1.7 - 7.7 K/uL  Lymphocytes Relative 40 %   Lymphs Abs 2.6 0.7 - 4.0 K/uL   Monocytes Relative 7 %   Monocytes Absolute 0.4 0.1 - 1.0 K/uL   Eosinophils Relative 2 %   Eosinophils Absolute 0.1 0.0 - 0.7 K/uL   Basophils Relative 1 %   Basophils Absolute 0.1 0.0 - 0.1 K/uL    Comment: Performed at [facility], [place] [address], [place], [place] [address]  Salicylate level     Status: None   Collection Time: 11/05/17  8:04 AM  Result Value Ref Range   Salicylate Lvl <0.3 2.8 - 30.0 mg/dL    Comment: Performed at [facility], [place] [address], [place], [place] [address]  Acetaminophen level     Status: Abnormal   Collection Time: 11/05/17  8:04 AM  Result Value Ref Range   Acetaminophen (Tylenol), Serum <10 (L) 10 - 30 ug/mL    Comment:        THERAPEUTIC CONCENTRATIONS VARY SIGNIFICANTLY. A RANGE OF 10-30 ug/mL MAY BE AN EFFECTIVE CONCENTRATION FOR MANY PATIENTS. HOWEVER, SOME ARE BEST TREATED AT CONCENTRATIONS OUTSIDE THIS RANGE. ACETAMINOPHEN CONCENTRATIONS >150 ug/mL AT 4 HOURS AFTER INGESTION AND >50 ug/mL AT 12 HOURS AFTER INGESTION ARE OFTEN ASSOCIATED WITH TOXIC REACTIONS. Performed at [facility], [place] [address] [place], [place] [address]   I-Stat beta hCG blood, ED     Status: None   Collection Time: 11/05/17  8:36 AM  Result Value Ref Range   I-stat hCG, quantitative <5.0 <5 mIU/mL   Comment 3            Comment:   GEST. AGE      CONC.  (mIU/mL)   <=1 WEEK        5 - 50      2 WEEKS       50 - 500     3 WEEKS       100 - 10,000     4 WEEKS     1,000 - 30,000        FEMALE AND NON-PREGNANT FEMALE:     LESS THAN 5 mIU/mL     Current Facility-Administered Medications  Medication Dose Route Frequency Provider Last Rate Last Dose  . albuterol (PROVENTIL HFA;VENTOLIN HFA) 108 (90 Base) MCG/ACT inhaler 2 puff  2 puff Inhalation Q6H PRN Nanavati, Ankit, MD      . cloNIDine (CATAPRES) tablet 0.1 mg  0.1 mg Oral QID Akintayo, Mojeed, MD   0.1 mg at 11/06/17 1038   Followed by  . [START ON 11/07/2017] cloNIDine (CATAPRES) tablet 0.1 mg  0.1 mg Oral BH-qamhs Akintayo, Mojeed, MD       Followed by  . [START ON 11/10/2017] cloNIDine (CATAPRES) tablet 0.1 mg  0.1 mg Oral QAC breakfast Akintayo, Mojeed, MD      . dicyclomine (BENTYL) tablet 20 mg  20 mg Oral Q6H PRN Akintayo, Mojeed, MD      . gabapentin (NEURONTIN) capsule 800 mg  800 mg Oral TID Kathrynn Humble, Ankit, MD   800 mg at 11/06/17 1040  . hydrOXYzine (ATARAX/VISTARIL) tablet 25 mg  25 mg Oral TID Corena Pilgrim, MD   25 mg at 11/06/17 1040  . loperamide (IMODIUM) capsule 2-4 mg  2-4 mg Oral PRN Akintayo, Mojeed, MD      . methocarbamol (ROBAXIN) tablet 500 mg  500 mg Oral Q8H PRN Akintayo, Mojeed, MD   500 mg at 11/05/17 2149  . naproxen (NAPROSYN) tablet 500 mg  500 mg Oral BID PRN Corena Pilgrim, MD   500 mg at 11/05/17 2149  . nicotine (NICODERM CQ - dosed in mg/24 hours) patch 21 mg  21 mg Transdermal Daily Kathrynn Humble, Ankit, MD   21 mg at 11/06/17 1040  . OLANZapine zydis (ZYPREXA) disintegrating tablet 10 mg  10 mg Oral Q8H PRN Akintayo, Mojeed, MD      . ondansetron (ZOFRAN-ODT) disintegrating tablet 4 mg  4 mg Oral Q6H PRN Akintayo, Mojeed, MD      . PARoxetine (PAXIL) tablet 20 mg  20 mg Oral Daily Ethelene Hal, NP      . traZODone (DESYREL) tablet 50 mg  50 mg Oral QHS PRN Rozetta Nunnery, NP   50 mg at 11/05/17 2149   Current Outpatient Medications  Medication Sig Dispense Refill  . ADDERALL XR 30  MG 24 hr capsule Take 30 mg by mouth 2 (two) times daily.   0  . ALPRAZolam (XANAX) 1 MG tablet Take 1 mg by mouth 3 (three) times daily.  0  . gabapentin (NEURONTIN) 400 MG capsule Take 2 capsules (800 mg total) by mouth 3 (three) times daily. (Patient taking differently: Take 800 mg by mouth 4 (four) times daily. ) 90 capsule 3  . ibuprofen (ADVIL,MOTRIN) 800 MG tablet Take 800 mg by mouth 3 (three) times daily as needed for pain.  12  . ipratropium (ATROVENT HFA) 17 MCG/ACT inhaler Inhale 2 puffs into the lungs every 6 (six) hours as needed for wheezing. 1 Inhaler 12  . oxyCODONE (ROXICODONE) 15 MG immediate release tablet Take 15 mg by mouth 4 (four) times daily.    Marland Kitchen PROAIR HFA 108 (90 Base) MCG/ACT inhaler Inhale 2 puffs into the lungs every 6 (six) hours as needed for wheezing or shortness of breath. 1 Inhaler 12  . sertraline (ZOLOFT) 100 MG tablet Take 100 mg by mouth daily.  5  . Buprenorphine HCl-Naloxone HCl (SUBOXONE) 8-2 MG FILM Place 1 Film under the tongue 2 (two) times daily. (Patient not taking: Reported on 11/05/2017) 14 each 0  . predniSONE (DELTASONE) 10 MG tablet Prednisone 40 mg po daily x 1 day then Prednisone 30 mg po daily x 1 day then Prednisone 20 mg po daily x 1 day then Prednisone 10 mg daily x 1 day then stop... (Patient not taking: Reported on 11/05/2017) 10 tablet 0  . senna-docusate (SENOKOT-S) 8.6-50 MG tablet Take 2 tablets by mouth 2 (two) times daily. (Patient not taking: Reported on 11/05/2017) 30 tablet 0    Musculoskeletal: Strength & Muscle Tone: within normal limits Gait & Station: normal Patient leans: N/A  Psychiatric Specialty Exam: Physical Exam  Nursing note and vitals reviewed. Constitutional: She is oriented to person, place, and time. She appears well-developed and well-nourished.  HENT:  Head: Normocephalic.  Respiratory: Effort normal.  Musculoskeletal: Normal range of motion.  Neurological: She is alert and oriented to person, place, and  time.  Psychiatric: Her speech is normal and behavior is normal. Thought content normal. Cognition and memory are normal. She expresses impulsivity. She exhibits a depressed mood.    Review of Systems  Psychiatric/Behavioral: Positive for depression. Negative for hallucinations, memory loss, substance abuse and suicidal ideas. The patient is not nervous/anxious and does not have insomnia.   All other systems reviewed and are negative.   Blood pressure 105/64, pulse 70, temperature 98.2 F (36.8 C), temperature source Oral, resp. rate 16, SpO2 97 %.There is no height or weight on file to calculate  BMI.  General Appearance: Casual  Eye Contact:  Good  Speech:  Clear and Coherent and Normal Rate  Volume:  Normal  Mood:  Depressed  Affect:  Congruent and Depressed  Thought Process:  Coherent, Goal Directed and Linear  Orientation:  Full (Time, Place, and Person)  Thought Content:  Logical  Suicidal Thoughts:  No  Homicidal Thoughts:  No  Memory:  Immediate;   Good Recent;   Good Remote;   Fair  Judgement:  Fair  Insight:  Fair  Psychomotor Activity:  Normal  Concentration:  Concentration: Good and Attention Span: Good  Recall:  Good  Fund of Knowledge:  Good  Language:  Good  Akathisia:  No  Handed:  Right  AIMS (if indicated):   N/A  Assets:  Communication Skills  ADL's:  Intact  Cognition:  WNL  Sleep:   N/A     Treatment Plan Summary: Plan Major depressive disorder, recurrent episode, moderate ([place]) Discharge Home Take all medications as prescribed Start Paxil 20 mg daily for depression.  Avoid the use of alcohol and illicit drugs Follow up with your regular Dr for medication management  Disposition: No evidence of imminent risk to self or others at present.   Patient does not meet criteria for psychiatric inpatient admission. Supportive therapy provided about ongoing stressors. Discussed crisis plan, support from social network, calling 911, coming to the Emergency  Department, and calling Suicide Hotline.  Ethelene Hal, NP 11/06/2017 12:46 PM   Patient seen face-to-face for psychiatric evaluation, chart reviewed and case discussed with the physician extender and developed treatment plan. Reviewed the information documented and agree with the treatment plan.  Buford Dresser, DO 11/06/17 6:10 PM

## 2017-11-06 NOTE — Discharge Instructions (Signed)
To help you maintain a sober lifestyle, a substance abuse treatment program may be beneficial to you.  Contact [facility] at your earliest opportunity to ask about enrolling in their program.  They also offer psychiatry upon request:       [facility]      [address], [place] [address]      [phone]      New patients are seen at the walk-in clinic every Tuesday from 9:00 am - 12:00 pm

## 2017-11-20 ENCOUNTER — Inpatient Hospital Stay (HOSPITAL_COMMUNITY)
Admission: AD | Admit: 2017-11-20 | Discharge: 2017-11-24 | DRG: 885 | Disposition: A | Discharge status: Rehabilitation Facility | Payer: Medicaid Other | Source: Other Acute Inpatient Hospital | Attending: Psychiatry | Admitting: Psychiatry

## 2017-11-20 ENCOUNTER — Encounter (HOSPITAL_COMMUNITY): Payer: Self-pay

## 2017-11-20 DIAGNOSIS — I10 Essential (primary) hypertension: Secondary | ICD-10-CM | POA: Diagnosis present

## 2017-11-20 DIAGNOSIS — Z560 Unemployment, unspecified: Secondary | ICD-10-CM | POA: Diagnosis not present

## 2017-11-20 DIAGNOSIS — R45851 Suicidal ideations: Secondary | ICD-10-CM | POA: Diagnosis present

## 2017-11-20 DIAGNOSIS — Z62810 Personal history of physical and sexual abuse in childhood: Secondary | ICD-10-CM | POA: Diagnosis present

## 2017-11-20 DIAGNOSIS — F17210 Nicotine dependence, cigarettes, uncomplicated: Secondary | ICD-10-CM | POA: Diagnosis present

## 2017-11-20 DIAGNOSIS — G4730 Sleep apnea, unspecified: Secondary | ICD-10-CM | POA: Diagnosis present

## 2017-11-20 DIAGNOSIS — E669 Obesity, unspecified: Secondary | ICD-10-CM | POA: Diagnosis present

## 2017-11-20 DIAGNOSIS — F4310 Post-traumatic stress disorder, unspecified: Secondary | ICD-10-CM | POA: Diagnosis present

## 2017-11-20 DIAGNOSIS — Z6841 Body Mass Index (BMI) 40.0 and over, adult: Secondary | ICD-10-CM | POA: Diagnosis not present

## 2017-11-20 DIAGNOSIS — Z818 Family history of other mental and behavioral disorders: Secondary | ICD-10-CM

## 2017-11-20 DIAGNOSIS — R450 Nervousness: Secondary | ICD-10-CM | POA: Diagnosis not present

## 2017-11-20 DIAGNOSIS — F339 Major depressive disorder, recurrent, unspecified: Secondary | ICD-10-CM | POA: Diagnosis not present

## 2017-11-20 DIAGNOSIS — F419 Anxiety disorder, unspecified: Secondary | ICD-10-CM | POA: Diagnosis not present

## 2017-11-20 DIAGNOSIS — F1110 Opioid abuse, uncomplicated: Secondary | ICD-10-CM | POA: Diagnosis not present

## 2017-11-20 DIAGNOSIS — Z590 Homelessness: Secondary | ICD-10-CM

## 2017-11-20 DIAGNOSIS — G4700 Insomnia, unspecified: Secondary | ICD-10-CM | POA: Diagnosis present

## 2017-11-20 DIAGNOSIS — F329 Major depressive disorder, single episode, unspecified: Secondary | ICD-10-CM | POA: Diagnosis present

## 2017-11-20 DIAGNOSIS — F332 Major depressive disorder, recurrent severe without psychotic features: Secondary | ICD-10-CM | POA: Diagnosis present

## 2017-11-20 MED ORDER — HYDROXYZINE HCL 50 MG PO TABS
50.0000 mg | ORAL_TABLET | Freq: Once | ORAL | Status: AC
  Administered 2017-11-20: 50 mg via ORAL
  Filled 2017-11-20 (×2): qty 1

## 2017-11-20 MED ORDER — ALUM & MAG HYDROXIDE-SIMETH 200-200-20 MG/5ML PO SUSP
30.0000 mL | ORAL | Status: DC | PRN
  Administered 2017-11-22: 30 mL via ORAL
  Filled 2017-11-20: qty 30

## 2017-11-20 MED ORDER — IBUPROFEN 800 MG PO TABS
800.0000 mg | ORAL_TABLET | Freq: Three times a day (TID) | ORAL | Status: DC | PRN
  Administered 2017-11-20 – 2017-11-23 (×5): 800 mg via ORAL
  Filled 2017-11-20 (×6): qty 1

## 2017-11-20 MED ORDER — MAGNESIUM HYDROXIDE 400 MG/5ML PO SUSP: 30.0000 mL | Freq: Every day | ORAL | Status: DC | PRN

## 2017-11-20 MED ORDER — TRAZODONE HCL 50 MG PO TABS
50.0000 mg | ORAL_TABLET | Freq: Every evening | ORAL | Status: DC | PRN
  Administered 2017-11-20 – 2017-11-23 (×4): 50 mg via ORAL
  Filled 2017-11-20 (×4): qty 1

## 2017-11-20 NOTE — Progress Notes (Signed)
Psychoeducational Group Note  Date:  11/20/2017 Time:  2257  Group Topic/Focus:  Wrap-Up Group:   The focus of this group is to help patients review their daily goal of treatment and discuss progress on daily workbooks.  Participation Level: Did Not Attend  Participation Quality:  Not Applicable  Affect:  Not Applicable  Cognitive:  Not Applicable  Insight:  Not Applicable  Engagement in Group: Not Applicable  Additional Comments:  The patient did not attend group since she had not been admitted to the hallway.   Hazle Coca S 11/20/2017, 10:57 PM

## 2017-11-21 ENCOUNTER — Other Ambulatory Visit: Payer: Self-pay

## 2017-11-21 DIAGNOSIS — F4310 Post-traumatic stress disorder, unspecified: Secondary | ICD-10-CM

## 2017-11-21 DIAGNOSIS — F339 Major depressive disorder, recurrent, unspecified: Secondary | ICD-10-CM

## 2017-11-21 DIAGNOSIS — R450 Nervousness: Secondary | ICD-10-CM

## 2017-11-21 DIAGNOSIS — Z818 Family history of other mental and behavioral disorders: Secondary | ICD-10-CM

## 2017-11-21 DIAGNOSIS — G4700 Insomnia, unspecified: Secondary | ICD-10-CM

## 2017-11-21 DIAGNOSIS — F419 Anxiety disorder, unspecified: Secondary | ICD-10-CM

## 2017-11-21 DIAGNOSIS — F1110 Opioid abuse, uncomplicated: Secondary | ICD-10-CM

## 2017-11-21 DIAGNOSIS — F17210 Nicotine dependence, cigarettes, uncomplicated: Secondary | ICD-10-CM

## 2017-11-21 MED ORDER — HYDROXYZINE HCL 50 MG PO TABS
50.0000 mg | ORAL_TABLET | Freq: Four times a day (QID) | ORAL | Status: DC | PRN
  Administered 2017-11-21 – 2017-11-23 (×5): 50 mg via ORAL
  Filled 2017-11-21 (×5): qty 1

## 2017-11-21 MED ORDER — NICOTINE 21 MG/24HR TD PT24
21.0000 mg | MEDICATED_PATCH | Freq: Every day | TRANSDERMAL | Status: DC
  Administered 2017-11-21 – 2017-11-23 (×2): 21 mg via TRANSDERMAL
  Filled 2017-11-21 (×7): qty 1

## 2017-11-21 MED ORDER — ARIPIPRAZOLE 10 MG PO TABS
10.0000 mg | ORAL_TABLET | Freq: Every day | ORAL | Status: DC
  Administered 2017-11-21 – 2017-11-24 (×4): 10 mg via ORAL
  Filled 2017-11-21 (×7): qty 1

## 2017-11-21 MED ORDER — GABAPENTIN 300 MG PO CAPS
300.0000 mg | ORAL_CAPSULE | Freq: Three times a day (TID) | ORAL | Status: DC
  Administered 2017-11-21 – 2017-11-24 (×10): 300 mg via ORAL
  Filled 2017-11-21 (×16): qty 1

## 2017-11-21 NOTE — Plan of Care (Signed)
Patient verbalizes understanding of information, education provided. 
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
## 2017-11-21 NOTE — Progress Notes (Signed)
Pt did not attend morning orientation/goals group. 

## 2017-11-21 NOTE — Progress Notes (Signed)
Patient ID: Judy Robles, female   DOB: 12-Aug-1976, 42 y.o.   MRN: [id number]  42 year old female presents to [facility] with complaints of increased depressive symptoms, hopelessness and passive suicidal ideation, isolation, guilt, not having access to medications and homelessness. Pt blood pressure currently HTN, 161/73 sitting and 142/100 standing. Pt obviously anxious and sad, cries intermittently during assessment. Pt states that since she was put on and then taken off of Paxil over 5 years ago she "has not been the same." Reports she was being seen at [facility] by Dr. Pleas Koch but it recently shut down and she no longer has access to her medications. Pt reports taking Subutex, Xanax, Gabapentin, Ambien and Adderall last on 11/17/17. Per report from [facility], they verified with the clinic that she had not received her controlled substances since mid February. Pt denies having any current support, has an estranged relationship with mother, sister, and two sons. Currently endorses chronic back pain 8/10 that radiates to her legs. Pt asks frequently for medications.   Assessment completed via in person assessment and chart review. Consents signed, skin/belongings search completed and pt oriented to unit. Pt stable at this time. Provider notified of patients concerns, see MAR.  Pt given the opportunity to express concerns and ask questions. Pt given toiletries. Will continue to monitor.
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
## 2017-11-21 NOTE — Progress Notes (Signed)
Patient signed in voluntarily per verbal order from MD. Form placed on chart and Tanya, NS notified for census purposes.

## 2017-11-21 NOTE — [facility] Suicide Risk Assessment (Signed)
[facility] Admission Suicide Risk Assessment   Nursing information obtained from:    Demographic factors:    Current Mental Status:    Loss Factors:    Historical Factors:    Risk Reduction Factors:     Total Time spent with patient: 1 hour Principal Problem: MDD (major depressive disorder) Diagnosis:   Patient Active Problem List   Diagnosis Date Noted  . PTSD (post-traumatic stress disorder) [F43.10] 11/21/2017  . MDD (major depressive disorder) [F32.9] 11/20/2017  . Major depressive disorder, recurrent episode, moderate (HCC) [F33.1] 11/05/2017  . Community acquired pneumonia of right lower lobe of lung (HCC) [J18.1] 09/02/2017  . Sepsis associated hypotension (HCC) [A41.9, I95.9] 09/02/2017  . AKI (acute kidney injury) (HCC) [N17.9] 09/02/2017  . Nausea and vomiting [R11.2] 09/02/2017  . Drug overdose [T50.901A] 04/22/2017  . Chest pain [R07.9] 10/09/2016  . Pressure injury of skin [L89.90] 10/05/2016  . Polysubstance dependence including opioid type drug with complication, episodic abuse (HCC) [F19.20] 07/21/2016  . MDD (major depressive disorder), recurrent severe, without psychosis (HCC) [F33.2] 07/21/2016  . Chronic pain [G89.29] 07/03/2015  . Diarrhea [R19.7] 05/01/2014  . Essential hypertension, benign [I10] 04/17/2014   Subjective Data: see H&P for details  Continued Clinical Symptoms:    The "Alcohol Use Disorders Identification Test", Guidelines for Use in Primary Care, Second Edition.  World Science writer [facility]). Score between 0-7:  no or low risk or alcohol related problems. Score between 8-15:  moderate risk of alcohol related problems. Score between 16-19:  high risk of alcohol related problems. Score 20 or above:  warrants further diagnostic evaluation for alcohol dependence and treatment.   CLINICAL FACTORS:   Severe Anxiety and/or Agitation Depression:   Severe    Psychiatric Specialty Exam: Physical Exam  Nursing note and vitals reviewed.   ROS-  see H&P for details  Blood pressure 137/78, pulse 82, temperature 97.9 F (36.6 C), temperature source Oral, resp. rate 18, height 5' 2.5" (1.588 m), weight 111.1 kg (245 lb), SpO2 100 %.Body mass index is 44.1 kg/m.    COGNITIVE FEATURES THAT CONTRIBUTE TO RISK:  None    SUICIDE RISK:   Mild:  Suicidal ideation of limited frequency, intensity, duration, and specificity.  There are no identifiable plans, no associated intent, mild dysphoria and related symptoms, good self-control (both objective and subjective assessment), few other risk factors, and identifiable protective factors, including available and accessible social support.  PLAN OF CARE: See H&P for details  I certify that inpatient services furnished can reasonably be expected to improve the patient's condition.   Micheal Likens, MD 11/21/2017, 2:37 PM

## 2017-11-21 NOTE — [facility] Suicide Risk Assessment (Signed)
[facility] INPATIENT:  Family/Significant Other Suicide Prevention Education  Suicide Prevention Education:  Patient Refusal for Family/Significant Other Suicide Prevention Education: The patient Judy Robles has refused to provide written consent for family/significant other to be provided Family/Significant Other Suicide Prevention Education during admission and/or prior to discharge.  Physician notified.  SPE completed with pt, as pt refused to consent to family contact. SPI pamphlet provided to pt and pt was encouraged to share information with support network, ask questions, and talk about any concerns relating to SPE. Pt denies access to guns/firearms and verbalized understanding of information provided. Mobile Crisis information also provided to pt.   Macel Yearsley N Smart LCSW 11/21/2017, 11:01 AM

## 2017-11-21 NOTE — Progress Notes (Addendum)
D: Patient observed in bed much of today. Resistant to participation even though staff and MD have strongly encouraged her. Patient states she is anxious to get restarted on her meds. Patient's affect flat, anxious and depressed with congruent mood. Patient does not list goal for today. Denies pain, physical complaints.   A: Medicated per orders, vistaril prn given for anxiety. Level III obs in place for safety. Emotional support offered and self inventory completion encouraged. Also encouraged completion of Suicide Safety Plan and programming participation. Discussed POC with MD, SW.  R: Patient verbalizes understanding of POC. Will reassess effectiveness of prn at 1 hour. Patient endorses passive SI and verbally contracts for safety. No HI/AVH. Pt remains safe on level III obs. Will continue to monitor closely and make verbal contact frequently.

## 2017-11-21 NOTE — Tx Team (Signed)
Initial Treatment Plan 11/21/2017 12:39 AM Judy Robles DXA:[id number]    PATIENT STRESSORS: Financial difficulties Health problems Loss of support Medication change or noncompliance Substance abuse   PATIENT STRENGTHS: Ability for insight Average or above average intelligence   PATIENT IDENTIFIED PROBLEMS: Depression  Suicide risk - " "I'm tired of being in pain, I just don't care"  "I want help figuring out how to get my life together"  "need to apply for disability"  homelessness  Unable to access medications due to [facility] closing and her doctor moving           DISCHARGE CRITERIA:  Improved stabilization in mood, thinking, and/or behavior Need for constant or close observation no longer present Safe-care adequate arrangements made Verbal commitment to aftercare and medication compliance  PRELIMINARY DISCHARGE PLAN: Outpatient therapy Placement in alternative living arrangements  PATIENT/FAMILY INVOLVEMENT: This treatment plan has been presented to and reviewed with the patient, Judy Robles. The patient and family have been given the opportunity to ask questions and make suggestions.  Aurora Mask, RN 11/21/2017, 12:39 AM

## 2017-11-22 NOTE — [facility] Group Notes (Signed)
LCSW Group Therapy Note  11/22/2017 1:15pm  Type of Therapy/Topic:  Group Therapy:  Feelings about Diagnosis  Participation Level:  Did Not Attend--pt invited. Chose to remain in bed.    Description of Group:   This group will allow patients to explore their thoughts and feelings about diagnoses they have received. Patients will be guided to explore their level of understanding and acceptance of these diagnoses. Facilitator will encourage patients to process their thoughts and feelings about the reactions of others to their diagnosis and will guide patients in identifying ways to discuss their diagnosis with significant others in their lives. This group will be process-oriented, with patients participating in exploration of their own experiences, giving and receiving support, and processing challenge from other group members.   Therapeutic Goals: 1. Patient will demonstrate understanding of diagnosis as evidenced by identifying two or more symptoms of the disorder 2. Patient will be able to express two feelings regarding the diagnosis 3. Patient will demonstrate their ability to communicate their needs through discussion and/or role play  Summary of Patient Progress:   x    Therapeutic Modalities:   Cognitive Behavioral Therapy Brief Therapy Feelings Identification    Ledell Peoples Smart, LCSW 11/22/2017 11:19 AM

## 2017-11-22 NOTE — Progress Notes (Addendum)
D:  Patient's self inventory sheet, patient has fair sleep, sleep medication not helpful.  Fair appetite, low energy level, poor concentration.  Rated depression 6, hopeless 7, anxiety 5.  Denied withdrawals.  Then checked irritability.  Denied SI.  Denied physical problems.  Physical pain, back.  No pain medicines.  Goal is work on motivation.  Plans to try harder.  No discharge plans. A:  Medications administered per MD orders.  Emotional support and encouragement given patient. R:  Denied SI and HI, contracts for safety.  Denied A/V hallucinations.  Safety maintained with 15 minute checks.

## 2017-11-22 NOTE — Tx Team (Signed)
Interdisciplinary Treatment and Diagnostic Plan Update  11/22/2017 Time of Session: 0830AM Judy Robles MRN: [id number]  Principal Diagnosis: MDD (major depressive disorder)  Secondary Diagnoses: Principal Problem:   MDD (major depressive disorder) Active Problems:   PTSD (post-traumatic stress disorder)   Current Medications:  Current Facility-Administered Medications  Medication Dose Route Frequency Provider Last Rate Last Dose  . alum & mag hydroxide-simeth (MAALOX/MYLANTA) 200-200-20 MG/5ML suspension 30 mL  30 mL Oral Q4H PRN Fransisca Kaufmann A, NP      . ARIPiprazole (ABILIFY) tablet 10 mg  10 mg Oral Daily Micheal Likens, MD   10 mg at 11/22/17 0827  . gabapentin (NEURONTIN) capsule 300 mg  300 mg Oral TID Micheal Likens, MD   300 mg at 11/22/17 0828  . hydrOXYzine (ATARAX/VISTARIL) tablet 50 mg  50 mg Oral Q6H PRN Micheal Likens, MD   50 mg at 11/22/17 0830  . ibuprofen (ADVIL,MOTRIN) tablet 800 mg  800 mg Oral Q8H PRN Kerry Hough, PA-C   800 mg at 11/22/17 0830  . magnesium hydroxide (MILK OF MAGNESIA) suspension 30 mL  30 mL Oral Daily PRN Fransisca Kaufmann A, NP      . nicotine (NICODERM CQ - dosed in mg/24 hours) patch 21 mg  21 mg Transdermal Daily Donell Sievert E, PA-C   21 mg at 11/21/17 0809  . traZODone (DESYREL) tablet 50 mg  50 mg Oral QHS PRN Thermon Leyland, NP   50 mg at 11/21/17 2121   PTA Medications: Medications Prior to Admission  Medication Sig Dispense Refill Last Dose  . ADDERALL XR 30 MG 24 hr capsule Take 30 mg by mouth 2 (two) times daily.   0 11/04/2017 at Unknown time  . ALPRAZolam (XANAX) 1 MG tablet Take 1 mg by mouth 3 (three) times daily.  0 11/04/2017 at Unknown time  . Buprenorphine HCl-Naloxone HCl (SUBOXONE) 8-2 MG FILM Place 1 Film under the tongue 2 (two) times daily. (Patient not taking: Reported on 11/05/2017) 14 each 0 Not Taking at Unknown time  . gabapentin (NEURONTIN) 400 MG capsule Take 2 capsules (800 mg total)  by mouth 3 (three) times daily. (Patient taking differently: Take 800 mg by mouth 4 (four) times daily. ) 90 capsule 3 11/04/2017 at Unknown time  . ibuprofen (ADVIL,MOTRIN) 800 MG tablet Take 800 mg by mouth 3 (three) times daily as needed for pain.  12 11/04/2017 at Unknown time  . ipratropium (ATROVENT HFA) 17 MCG/ACT inhaler Inhale 2 puffs into the lungs every 6 (six) hours as needed for wheezing. 1 Inhaler 12 11/04/2017 at Unknown time  . oxyCODONE (ROXICODONE) 15 MG immediate release tablet Take 15 mg by mouth 4 (four) times daily.   11/04/2017 at Unknown time  . predniSONE (DELTASONE) 10 MG tablet Prednisone 40 mg po daily x 1 day then Prednisone 30 mg po daily x 1 day then Prednisone 20 mg po daily x 1 day then Prednisone 10 mg daily x 1 day then stop... (Patient not taking: Reported on 11/05/2017) 10 tablet 0 Not Taking at Unknown time  . PROAIR HFA 108 (90 Base) MCG/ACT inhaler Inhale 2 puffs into the lungs every 6 (six) hours as needed for wheezing or shortness of breath. 1 Inhaler 12 Past Week at Unknown time  . senna-docusate (SENOKOT-S) 8.6-50 MG tablet Take 2 tablets by mouth 2 (two) times daily. (Patient not taking: Reported on 11/05/2017) 30 tablet 0 Not Taking at Unknown time  . sertraline (ZOLOFT) 100 MG  tablet Take 100 mg by mouth daily.  5 Past Week at Unknown time    Patient Stressors: Financial difficulties Health problems Loss of support Medication change or noncompliance Substance abuse  Patient Strengths: Ability for insight Average or above average intelligence  Treatment Modalities: Medication Management, Group therapy, Case management,  1 to 1 session with clinician, Psychoeducation, Recreational therapy.   Physician Treatment Plan for Primary Diagnosis: MDD (major depressive disorder) Long Term Goal(s): Improvement in symptoms so as ready for discharge Improvement in symptoms so as ready for discharge   Short Term Goals: Ability to identify and develop effective  coping behaviors will improve Compliance with prescribed medications will improve  Medication Management: Evaluate patient's response, side effects, and tolerance of medication regimen.  Therapeutic Interventions: 1 to 1 sessions, Unit Group sessions and Medication administration.  Evaluation of Outcomes: Progressing  Physician Treatment Plan for Secondary Diagnosis: Principal Problem:   MDD (major depressive disorder) Active Problems:   PTSD (post-traumatic stress disorder)  Long Term Goal(s): Improvement in symptoms so as ready for discharge Improvement in symptoms so as ready for discharge   Short Term Goals: Ability to identify and develop effective coping behaviors will improve Compliance with prescribed medications will improve     Medication Management: Evaluate patient's response, side effects, and tolerance of medication regimen.  Therapeutic Interventions: 1 to 1 sessions, Unit Group sessions and Medication administration.  Evaluation of Outcomes: Progressing   RN Treatment Plan for Primary Diagnosis: MDD (major depressive disorder) Long Term Goal(s): Knowledge of disease and therapeutic regimen to maintain health will improve  Short Term Goals: Ability to remain free from injury will improve, Ability to verbalize feelings will improve, Ability to disclose and discuss suicidal ideas and Ability to identify and develop effective coping behaviors will improve  Medication Management: RN will administer medications as ordered by provider, will assess and evaluate patient's response and provide education to patient for prescribed medication. RN will report any adverse and/or side effects to prescribing provider.  Therapeutic Interventions: 1 on 1 counseling sessions, Psychoeducation, Medication administration, Evaluate responses to treatment, Monitor vital signs and CBGs as ordered, Perform/monitor CIWA, COWS, AIMS and Fall Risk screenings as ordered, Perform wound care  treatments as ordered.  Evaluation of Outcomes: Progressing   LCSW Treatment Plan for Primary Diagnosis: MDD (major depressive disorder) Long Term Goal(s): Safe transition to appropriate next level of care at discharge, Engage patient in therapeutic group addressing interpersonal concerns.  Short Term Goals: Engage patient in aftercare planning with referrals and resources, Facilitate patient progression through stages of change regarding substance use diagnoses and concerns and Identify triggers associated with mental health/substance abuse issues  Therapeutic Interventions: Assess for all discharge needs, 1 to 1 time with Social worker, Explore available resources and support systems, Assess for adequacy in community support network, Educate family and significant other(s) on suicide prevention, Complete Psychosocial Assessment, Interpersonal group therapy.  Evaluation of Outcomes: Progressing   Progress in Treatment: Attending groups: No. Participating in groups: No.New to unit. Continuing to assess.  Taking medication as prescribed: Yes. Toleration medication: Yes. Family/Significant other contact made: SPE completed with pt; pt declined to consent to family contact.  Patient understands diagnosis: Yes. Discussing patient identified problems/goals with staff: Yes. Medical problems stabilized or resolved: No. Denies suicidal/homicidal ideation: No. Passive SI/Able to contract for safety on the unit.  Issues/concerns per patient self-inventory: No. Other: n/a   New problem(s) identified: No, Describe:  n/a  New Short Term/Long Term Goal(s): detox, medication management for  mood stabilization; elimination of SI thoughts; development of comprehensive mental wellness/sobriety plan.   Patient Goal: "to get help for my depression and anxiety."   Discharge Plan or Barriers: Pt is interested in residential treatment; however has not used drugs/alcohol recently. Pt may be eligible for  [facility] per Director Valora Corporal. CSW provided pt with information to call [facility] today for phone interview. [facility] pamphlet and AA/NA information provided for additional community support. Pt reports she is currently homeless.   Reason for Continuation of Hospitalization: Anxiety Depression Medication stabilization Suicidal ideation Withdrawal symptoms  Estimated Length of Stay: Friday, 11/24/17  Attendees: Patient: Judy Robles 11/22/2017 8:33 AM  Physician: Dr. Altamese [place] MD; Dr. Jama Flavors MD 11/22/2017 8:33 AM  Nursing: Meriam Sprague RN; Marchelle Folks RN 11/22/2017 8:33 AM  RN Care Manager:x 11/22/2017 8:33 AM  Social Worker: Trula Slade, LCSW 11/22/2017 8:33 AM  Recreational Therapist: x 11/22/2017 8:33 AM  Other: Reola Calkins NP 11/22/2017 8:33 AM  Other:  11/22/2017 8:33 AM  Other: 11/22/2017 8:33 AM    Scribe for Treatment Team: Ledell Peoples Smart, LCSW 11/22/2017 8:33 AM

## 2017-11-22 NOTE — Progress Notes (Signed)
Pt has been in the bed most of the time this evening, but she did get up for snack and to get meds for the night.  Pt contracts for safety.  She denies HI/AVH.  Pt presents as flat/sad/sullen.  She says mostly to herself with minimal interaction with peers.  She makes her needs known to staff.  She would like long term treatment after discharge from [facility].  Pt is working with the CSW to find placement. Support and encouragement offered.  Discharge plans are in process.  Safety maintained with q15 minute checks.

## 2017-11-22 NOTE — Progress Notes (Signed)
Pt reports she had med changes today and is feeling more hopeful than last night when she came into the hospital.  She is still having some passive suicidal thoughts, but contracts for safety.  She denies HI/AVH. She has been observed mostly in the dayroom this evening and not isolating to her room.  She was encouraged to make her needs known to staff.  Pt voiced no concerns at this time.  Support and encouragement offered.  Discharge plans are in process.  Pt would like to go to rehab after discharge from [facility].  Safety maintained with q15 minute checks.

## 2017-11-22 NOTE — Progress Notes (Signed)
[facility] MD Progress Note  11/22/2017 4:08 PM Judy Robles  MRN:  [id number] Subjective:    Judy Robles is a 42 y/o F with history of MDD, PTSD, and polysubstance abuse who was admitted from [facility] with worsening depression and non-adherence to her outpatient treatment regimen. Pt had been taking zoloft as an outpatient but she stopped about 1 week prior to admission. Pt requested to be restarted on several controlled substances including xanax, adderall, subutex, and ativan, but it was confirmed that patient has not been prescribed those medications since February 2019. Pt was admitted to [facility] and she was started on trial of abilify in addition to being restarted on home medication of gabapentin. She was also given PRN atarax for anxiety as well as PRN trazodone for insomnia. She was in agreement to discuss with SW team about treatment options for substance use/dependecy after discharge.  Today upon evaluation, pt shares, "I'm doing okay- just a lot of anxiety today. Can I have ativan?" Discussed with patient that our strategy for her medications is to avoid habit-forming medications, and she has vistaril available PRN anxiety, and pt verbalized good understanding. Pt denies SI/HI/AH/VH. She denies physical complaints. She is sleeping well. Her appetite is good. She notes that she is tolerating her current medications without difficulty or side effects, and she is in agreement to continue her current regimen without changes. She will discuss options for substance use treatment with the SW team including possibility of going to [facility]. Pt was in agreement with the above plan, and she had no further questions, comments, or concerns.  Principal Problem: MDD (major depressive disorder) Diagnosis:   Patient Active Problem List   Diagnosis Date Noted  . PTSD (post-traumatic stress disorder) [F43.10] 11/21/2017  . MDD (major depressive disorder) [F32.9] 11/20/2017  . Major depressive  disorder, recurrent episode, moderate (HCC) [F33.1] 11/05/2017  . Community acquired pneumonia of right lower lobe of lung (HCC) [J18.1] 09/02/2017  . Sepsis associated hypotension (HCC) [A41.9, I95.9] 09/02/2017  . AKI (acute kidney injury) (HCC) [N17.9] 09/02/2017  . Nausea and vomiting [R11.2] 09/02/2017  . Drug overdose [T50.901A] 04/22/2017  . Chest pain [R07.9] 10/09/2016  . Pressure injury of skin [L89.90] 10/05/2016  . Polysubstance dependence including opioid type drug with complication, episodic abuse (HCC) [F19.20] 07/21/2016  . MDD (major depressive disorder), recurrent severe, without psychosis (HCC) [F33.2] 07/21/2016  . Chronic pain [G89.29] 07/03/2015  . Diarrhea [R19.7] 05/01/2014  . Essential hypertension, benign [I10] 04/17/2014   Total Time spent with patient: 30 minutes  Past Psychiatric History: See H&P  Past Medical History:  Past Medical History:  Diagnosis Date  . Arthritis   . Depression   . Hypertension   . MRSA (methicillin resistant Staphylococcus aureus)   . Obesity   . Sleep apnea     Past Surgical History:  Procedure Laterality Date  . I&D EXTREMITY Right 04/16/2014   Procedure: IRRIGATION AND DEBRIDEMENT EXTREMITY;  Surgeon: Sharma Covert, MD;  Location: [facility] OR;  Service: Orthopedics;  Laterality: Right;   Family History:  Family History  Problem Relation Age of Onset  . Throat cancer Mother   . Hypertension Father   . Stroke Father    Family Psychiatric  History: see H&P Social History:  Social History   Substance and Sexual Activity  Alcohol Use Yes   Comment: rare, occasional     Social History   Substance and Sexual Activity  Drug Use Yes  . Types: Oxycodone  Comment: "crack" hx; Pt denied current use    Social History   Socioeconomic History  . Marital status: Single    Spouse name: Not on file  . Number of children: Not on file  . Years of education: Not on file  . Highest education level: Not on file  Occupational  History  . Not on file  Social Needs  . Financial resource strain: Not on file  . Food insecurity:    Worry: Not on file    Inability: Not on file  . Transportation needs:    Medical: Not on file    Non-medical: Not on file  Tobacco Use  . Smoking status: Current Every Day Smoker    Packs/day: 1.00    Types: Cigarettes  . Smokeless tobacco: Never Used  . Tobacco comment: 1/2PPD  Substance and Sexual Activity  . Alcohol use: Yes    Comment: rare, occasional  . Drug use: Yes    Types: Oxycodone    Comment: "crack" hx; Pt denied current use  . Sexual activity: Yes    Birth control/protection: Surgical  Lifestyle  . Physical activity:    Days per week: Not on file    Minutes per session: Not on file  . Stress: Not on file  Relationships  . Social connections:    Talks on phone: Not on file    Gets together: Not on file    Attends religious service: Not on file    Active member of club or organization: Not on file    Attends meetings of clubs or organizations: Not on file    Relationship status: Not on file  Other Topics Concern  . Not on file  Social History Narrative  . Not on file   Additional Social History:    Pain Medications: subutex 3 8mg  tabs a day, pt reports last taken 11/17/17 Prescriptions: subutex, xanax, gabapentin, ambien pt reports last taken 11/17/17 Over the Counter: denies abuse - see pta meds list` History of alcohol / drug use?: Yes(past use of opana, crack) Longest period of sobriety (when/how long): none Negative Consequences of Use: Financial, Personal relationships, Work / School                    Sleep: Good  Appetite:  Good  Current Medications: Current Facility-Administered Medications  Medication Dose Route Frequency Provider Last Rate Last Dose  . alum & mag hydroxide-simeth (MAALOX/MYLANTA) 200-200-20 MG/5ML suspension 30 mL  30 mL Oral Q4H PRN 02-28-2001 A, NP      . ARIPiprazole (ABILIFY) tablet 10 mg  10 mg Oral Daily  Fransisca Kaufmann, MD   10 mg at 11/22/17 0827  . gabapentin (NEURONTIN) capsule 300 mg  300 mg Oral TID 01/22/18 T, MD   300 mg at 11/22/17 1214  . hydrOXYzine (ATARAX/VISTARIL) tablet 50 mg  50 mg Oral Q6H PRN 01/22/18, MD   50 mg at 11/22/17 0830  . ibuprofen (ADVIL,MOTRIN) tablet 800 mg  800 mg Oral Q8H PRN 01/22/18, PA-C   800 mg at 11/22/17 0830  . magnesium hydroxide (MILK OF MAGNESIA) suspension 30 mL  30 mL Oral Daily PRN 01/22/18 A, NP      . nicotine (NICODERM CQ - dosed in mg/24 hours) patch 21 mg  21 mg Transdermal Daily Fransisca Kaufmann E, PA-C   21 mg at 11/21/17 0809  . traZODone (DESYREL) tablet 50 mg  50 mg Oral QHS PRN 01/21/18,  NP   50 mg at 11/21/17 2121    Lab Results: No results found for this or any previous visit (from the past 48 hour(s)).  Blood Alcohol level:  Lab Results  Component Value Date   ETH <10 11/05/2017   ETH <10 06/20/2017    Metabolic Disorder Labs: Lab Results  Component Value Date   HGBA1C 5.6 04/16/2014   MPG 114 04/16/2014   No results found for: PROLACTIN Lab Results  Component Value Date   CHOL 88 09/05/2017   TRIG 78 09/05/2017   HDL 22 (L) 09/05/2017   CHOLHDL 4.0 09/05/2017   VLDL 16 09/05/2017   LDLCALC 50 09/05/2017   LDLCALC 89 07/23/2016    Physical Findings: AIMS: Facial and Oral Movements Muscles of Facial Expression: None, normal Lips and Perioral Area: None, normal Jaw: None, normal Tongue: None, normal,Extremity Movements Upper (arms, wrists, hands, fingers): None, normal Lower (legs, knees, ankles, toes): None, normal, Trunk Movements Neck, shoulders, hips: None, normal, Overall Severity Severity of abnormal movements (highest score from questions above): None, normal Incapacitation due to abnormal movements: None, normal Patient's awareness of abnormal movements (rate only patient's report): No Awareness, Dental Status Current problems with teeth and/or  dentures?: Yes Does patient usually wear dentures?: No  CIWA:  CIWA-Ar Total: 1 COWS:  COWS Total Score: 2  Musculoskeletal: Strength & Muscle Tone: within normal limits Gait & Station: normal Patient leans: N/A  Psychiatric Specialty Exam: Physical Exam  Nursing note and vitals reviewed.   Review of Systems  Constitutional: Negative for chills and fever.  Respiratory: Negative for cough and shortness of breath.   Cardiovascular: Negative for chest pain.  Gastrointestinal: Negative for abdominal pain, heartburn, nausea and vomiting.  Psychiatric/Behavioral: Positive for depression. Negative for hallucinations and suicidal ideas. The patient is nervous/anxious. The patient does not have insomnia.     Blood pressure 137/78, pulse 82, temperature 97.9 F (36.6 C), temperature source Oral, resp. rate 18, height 5' 2.5" (1.588 m), weight 111.1 kg (245 lb), SpO2 100 %.Body mass index is 44.1 kg/m.  General Appearance: Casual and Fairly Groomed  Eye Contact:  Good  Speech:  Clear and Coherent and Normal Rate  Volume:  Normal  Mood:  Anxious and Depressed  Affect:  Appropriate, Congruent and Constricted  Thought Process:  Coherent  Orientation:  Full (Time, Place, and Person)  Thought Content:  Logical  Suicidal Thoughts:  No  Homicidal Thoughts:  No  Memory:  Immediate;   Fair Recent;   Fair Remote;   Fair  Judgement:  Fair  Insight:  Lacking  Psychomotor Activity:  Normal  Concentration:  Concentration: Fair  Recall:  [employer] of Knowledge:  Fair  Language:  Fair  Akathisia:  No  Handed:    AIMS (if indicated):     Assets:  Communication Skills Resilience Social Support  ADL's:  Intact  Cognition:  WNL  Sleep:  Number of Hours: 6.75   Treatment Plan Summary: Daily contact with patient to assess and evaluate symptoms and progress in treatment and Medication management   - Continue inpatient hospitalization  - MDD, recurrent, severe, without psychosis and PTSD    - Contiune abilify 10mg  po qDay  -Anxiety    - Continue gabapentin 300mg  po TID   - Continue atarax 50mg  po q6h prn anxiety  -Insomnia   - Continue trazodone 50mg  po qhs prn insomnia  -Encourage participation in groups and therapeutic milieu  -Disposition planning will be ongoing  Micheal Likens,  MD 11/22/2017, 4:08 PM

## 2017-11-22 NOTE — Plan of Care (Signed)
Nurse discussed depression, anxiety, coping skills with patient.  

## 2017-11-22 NOTE — Progress Notes (Signed)
Recreation Therapy Notes  Date: 4.3.19 Time: 9:30 a.m. Location: 300 Hall Dayroom   Group Topic: Stress Management   Goal Area(s) Addresses:  Goal 1.1: To reduce stress  -Patient will report feeling a reduction in stress level  -Patient will identify the importance of stress management  -Patient will participate during stress management group treatment     Intervention: Stress Management   Activity: Meditation- Patients were in a peaceful environment with soft lighting enhancing patients mood. Patients listened to a body scan meditation to help decrease tension and stress levels    Education: Stress Management, Discharge Planning.    Education Outcome: Acknowledges edcuation/In group clarification offered/Needs additional education   Clinical Observations/Feedback:: Patient did not attend     Sheryle Hail, Recreation Therapy Intern   Sheryle Hail 11/22/2017 8:05 AM

## 2017-11-22 NOTE — Progress Notes (Signed)
CSW met with pt individually to discuss [facility] as a potential discharge option. Pt was groggy and laying in bed. She was minimally interested in information provided to her but stated that she would look over the information. CSW encouraged her to call Satira Sark, program director to find out more and run her medicaid for eligibility.  Maxie Better, MSW, LCSW Clinical Social Worker 11/22/2017 2:40 PM

## 2017-11-22 NOTE — Progress Notes (Addendum)
Patient did not attend 1600 nursing group.

## 2017-11-23 MED ORDER — TRAZODONE HCL 50 MG PO TABS
50.0000 mg | ORAL_TABLET | Freq: Every evening | ORAL | Status: DC | PRN
  Administered 2017-11-23: 50 mg via ORAL
  Filled 2017-11-23 (×6): qty 1

## 2017-11-23 NOTE — [facility] Group Notes (Signed)
Adult Psychoeducational Group Note  Date:  11/23/2017 Time:  9:11 PM  Group Topic/Focus:  Wrap-Up Group:   The focus of this group is to help patients review their daily goal of treatment and discuss progress on daily workbooks.  Participation Level:  Active  Participation Quality:  Appropriate and Attentive  Affect:  Appropriate  Cognitive:  Alert and Appropriate  Insight: Appropriate and Good  Engagement in Group:  Engaged  Modes of Intervention:  Discussion and Education  Additional Comments:  Pt attended and participated in wrap up group this evening. Pt rated their day a 5/10 because they were able to get out of the room more, which was also the pt goal for the day. A positive noted by the pt was that they are going home tomorrow.   [name] 11/23/2017, 9:11 PM

## 2017-11-23 NOTE — Progress Notes (Signed)
[facility] Group Notes:  (Nursing/MHT/Case Management/Adjunct)  Date:  11/23/2017  Time:  1600 Type of Therapy:  Nurse Education-Aromatherapy   Participation Level: Did not attend.    Conducted by Jan. Wright, RN.   

## 2017-11-23 NOTE — Progress Notes (Signed)
D:Pt was difficult to awake this morning and did not go to breakfast. She allowed writer to take her vitals and took her morning medications without complaint.  A:Offered support, encouragement and 15 minute checks.  R:Pt verbally denies si and hi. Safety maintained on the unit.

## 2017-11-23 NOTE — Progress Notes (Signed)
CSW and Dr. Desta Fetter MD met with pt to discuss aftercare plan and pt progress. Pt states she is not allowed back at [facility], so did not bother to call yesterday. She requested [facility] referral. Pt made aware that [facility] is full currently and we are nearing discharge for her. She also acknowledged that she has been abusing pain medication prior to admission and did not disclose that when admitted.   [facility] referral made-per Shayla in admissions, there is a waitlist currently. CSW contacted Satira Sark from [facility], who stated that she WILL RECONSIDER patient and would like to speak with her today. Pt made aware and encouraged to call Tracie.  Maxie Better, MSW, LCSW Clinical Social Worker 11/23/2017 10:18 AM

## 2017-11-23 NOTE — Progress Notes (Signed)
[facility] MD Progress Note  11/23/2017 12:39 PM Judy Robles  MRN:  [id number] Subjective:    Judy Robles is a 42 y/o F with history of MDD, PTSD, and polysubstance abuse who was admitted from [facility] with worsening depression and non-adherence to her outpatient treatment regimen. Pt had been taking zoloft as an outpatient but she stopped about 1 week prior to admission. Pt requested to be restarted on several controlled substances including xanax, adderall, subutex, and ativan, but it was confirmed that patient has not been prescribed those medications since February 2019. Pt was admitted to [facility] and she was started on trial of abilify in addition to being restarted on home medication of gabapentin. She was also given PRN atarax for anxiety as well as PRN trazodone for insomnia. She was in agreement to discuss with SW team about treatment options for substance use/dependecy after discharge.  Today upon evaluation, pt shares, "I'm alright - I just kind of feel weak, and maybe a little dizzy today." Pt is able to ambulate without difficulty, but she describes feeling mentally clouded and has general fatigue and malaise. She denies other physical complaints. She denies SI/HI/AH/VH, however, she is unable to contract for safety outside the hospital, stating that she would return immediately to abusing illicit substances if she were to discharge. Pt had not shared her most recent illicit substance use upon initial evaluation, and today she reveals that she had been using Opana tablets up to 3 tablets per day (unknown strength) and oxycodone 5mg  up to 6 tablets per day prior to her admission. She feels that outside of the hospital setting she would immediately return to using these substances again due to poor impulse control. Pt is in agreement to referral to long-term substance rehabilitation, and she agrees to reach out to [facility] today to check on availability. She agrees to continue her current  treatment regimen without changes at this time. She had no further questions, comments, or concerns.   Principal Problem: MDD (major depressive disorder) Diagnosis:   Patient Active Problem List   Diagnosis Date Noted  . PTSD (post-traumatic stress disorder) [F43.10] 11/21/2017  . MDD (major depressive disorder) [F32.9] 11/20/2017  . Major depressive disorder, recurrent episode, moderate (HCC) [F33.1] 11/05/2017  . Community acquired pneumonia of right lower lobe of lung (HCC) [J18.1] 09/02/2017  . Sepsis associated hypotension (HCC) [A41.9, I95.9] 09/02/2017  . AKI (acute kidney injury) (HCC) [N17.9] 09/02/2017  . Nausea and vomiting [R11.2] 09/02/2017  . Drug overdose [T50.901A] 04/22/2017  . Chest pain [R07.9] 10/09/2016  . Pressure injury of skin [L89.90] 10/05/2016  . Polysubstance dependence including opioid type drug with complication, episodic abuse (HCC) [F19.20] 07/21/2016  . MDD (major depressive disorder), recurrent severe, without psychosis (HCC) [F33.2] 07/21/2016  . Chronic pain [G89.29] 07/03/2015  . Diarrhea [R19.7] 05/01/2014  . Essential hypertension, benign [I10] 04/17/2014   Total Time spent with patient: 30 minutes  Past Psychiatric History: see H&P  Past Medical History:  Past Medical History:  Diagnosis Date  . Arthritis   . Depression   . Hypertension   . MRSA (methicillin resistant Staphylococcus aureus)   . Obesity   . Sleep apnea     Past Surgical History:  Procedure Laterality Date  . I&D EXTREMITY Right 04/16/2014   Procedure: IRRIGATION AND DEBRIDEMENT EXTREMITY;  Surgeon: 04/18/2014, MD;  Location: [facility] OR;  Service: Orthopedics;  Laterality: Right;   Family History:  Family History  Problem Relation Age of Onset  .  Throat cancer Mother   . Hypertension Father   . Stroke Father    Family Psychiatric  History: see H&P Social History:  Social History   Substance and Sexual Activity  Alcohol Use Yes   Comment: rare, occasional      Social History   Substance and Sexual Activity  Drug Use Yes  . Types: Oxycodone   Comment: "crack" hx; Pt denied current use    Social History   Socioeconomic History  . Marital status: Single    Spouse name: Not on file  . Number of children: Not on file  . Years of education: Not on file  . Highest education level: Not on file  Occupational History  . Not on file  Social Needs  . Financial resource strain: Not on file  . Food insecurity:    Worry: Not on file    Inability: Not on file  . Transportation needs:    Medical: Not on file    Non-medical: Not on file  Tobacco Use  . Smoking status: Current Every Day Smoker    Packs/day: 1.00    Types: Cigarettes  . Smokeless tobacco: Never Used  . Tobacco comment: 1/2PPD  Substance and Sexual Activity  . Alcohol use: Yes    Comment: rare, occasional  . Drug use: Yes    Types: Oxycodone    Comment: "crack" hx; Pt denied current use  . Sexual activity: Yes    Birth control/protection: Surgical  Lifestyle  . Physical activity:    Days per week: Not on file    Minutes per session: Not on file  . Stress: Not on file  Relationships  . Social connections:    Talks on phone: Not on file    Gets together: Not on file    Attends religious service: Not on file    Active member of club or organization: Not on file    Attends meetings of clubs or organizations: Not on file    Relationship status: Not on file  Other Topics Concern  . Not on file  Social History Narrative  . Not on file   Additional Social History:    Pain Medications: subutex 3 8mg  tabs a day, pt reports last taken 11/17/17 Prescriptions: subutex, xanax, gabapentin, ambien pt reports last taken 11/17/17 Over the Counter: denies abuse - see pta meds list` History of alcohol / drug use?: Yes(past use of opana, crack) Longest period of sobriety (when/how long): none Negative Consequences of Use: Financial, Personal relationships, Work / School                     Sleep: Good  Appetite:  Good  Current Medications: Current Facility-Administered Medications  Medication Dose Route Frequency Provider Last Rate Last Dose  . alum & mag hydroxide-simeth (MAALOX/MYLANTA) 200-200-20 MG/5ML suspension 30 mL  30 mL Oral Q4H PRN Fransisca Kaufmann A, NP   30 mL at 11/22/17 1720  . ARIPiprazole (ABILIFY) tablet 10 mg  10 mg Oral Daily Micheal Likens, MD   10 mg at 11/23/17 0914  . gabapentin (NEURONTIN) capsule 300 mg  300 mg Oral TID Micheal Likens, MD   300 mg at 11/23/17 0913  . hydrOXYzine (ATARAX/VISTARIL) tablet 50 mg  50 mg Oral Q6H PRN Micheal Likens, MD   50 mg at 11/22/17 1719  . ibuprofen (ADVIL,MOTRIN) tablet 800 mg  800 mg Oral Q8H PRN Kerry Hough, PA-C   800 mg at 11/22/17 2133  .  magnesium hydroxide (MILK OF MAGNESIA) suspension 30 mL  30 mL Oral Daily PRN Fransisca Kaufmann A, NP      . nicotine (NICODERM CQ - dosed in mg/24 hours) patch 21 mg  21 mg Transdermal Daily Donell Sievert E, PA-C   21 mg at 11/23/17 0914  . traZODone (DESYREL) tablet 50 mg  50 mg Oral QHS PRN Thermon Leyland, NP   50 mg at 11/22/17 2133    Lab Results: No results found for this or any previous visit (from the past 48 hour(s)).  Blood Alcohol level:  Lab Results  Component Value Date   ETH <10 11/05/2017   ETH <10 06/20/2017    Metabolic Disorder Labs: Lab Results  Component Value Date   HGBA1C 5.6 04/16/2014   MPG 114 04/16/2014   No results found for: PROLACTIN Lab Results  Component Value Date   CHOL 88 09/05/2017   TRIG 78 09/05/2017   HDL 22 (L) 09/05/2017   CHOLHDL 4.0 09/05/2017   VLDL 16 09/05/2017   LDLCALC 50 09/05/2017   LDLCALC 89 07/23/2016    Physical Findings: AIMS: Facial and Oral Movements Muscles of Facial Expression: None, normal Lips and Perioral Area: None, normal Jaw: None, normal Tongue: None, normal,Extremity Movements Upper (arms, wrists, hands, fingers): None, normal Lower (legs,  knees, ankles, toes): None, normal, Trunk Movements Neck, shoulders, hips: None, normal, Overall Severity Severity of abnormal movements (highest score from questions above): None, normal Incapacitation due to abnormal movements: None, normal Patient's awareness of abnormal movements (rate only patient's report): No Awareness, Dental Status Current problems with teeth and/or dentures?: Yes Does patient usually wear dentures?: No  CIWA:  CIWA-Ar Total: 1 COWS:  COWS Total Score: 3  Musculoskeletal: Strength & Muscle Tone: within normal limits Gait & Station: normal Patient leans: N/A  Psychiatric Specialty Exam: Physical Exam  Nursing note and vitals reviewed.   Review of Systems  Constitutional: Positive for malaise/fatigue. Negative for chills and fever.  Respiratory: Negative for cough and shortness of breath.   Cardiovascular: Negative for chest pain.  Gastrointestinal: Negative for abdominal pain, heartburn, nausea and vomiting.  Neurological: Positive for dizziness.  Psychiatric/Behavioral: Positive for depression. Negative for hallucinations and suicidal ideas. The patient is nervous/anxious.     Blood pressure 134/68, pulse 86, temperature 98.5 F (36.9 C), temperature source Oral, resp. rate 16, height 5' 2.5" (1.588 m), weight 111.1 kg (245 lb), SpO2 100 %.Body mass index is 44.1 kg/m.  General Appearance: Casual and Fairly Groomed  Eye Contact:  Good  Speech:  Clear and Coherent and Normal Rate  Volume:  Normal  Mood:  Anxious and Depressed  Affect:  Congruent and Constricted  Thought Process:  Coherent and Goal Directed  Orientation:  Full (Time, Place, and Person)  Thought Content:  Logical  Suicidal Thoughts:  No  Homicidal Thoughts:  No  Memory:  Immediate;   Fair Recent;   Fair Remote;   Fair  Judgement:  Poor  Insight:  Lacking  Psychomotor Activity:  Normal  Concentration:  Concentration: Fair  Recall:  [employer] of Knowledge:  Fair  Language:  Fair   Akathisia:  No  Handed:    AIMS (if indicated):     Assets:  Communication Skills Resilience Social Support  ADL's:  Intact  Cognition:  WNL  Sleep:  Number of Hours: 6.75   Treatment Plan Summary: Daily contact with patient to assess and evaluate symptoms and progress in treatment and Medication management   -  Continue inpatient hospitalization  - MDD, recurrent, severe, without psychosis and PTSD             - Contiune abilify 10mg  po qDay  -Anxiety             - Continue gabapentin 300mg  po TID             - Continue atarax 50mg  po q6h prn anxiety  -Insomnia             - Continue trazodone 50mg  po qhs prn insomnia  -Encourage participation in groups and therapeutic milieu  -Disposition planning will be ongoing  -Pt will work with SW team regarding substance use treatment referrals  , MD 11/23/2017, 12:39 PM

## 2017-11-23 NOTE — BHH Group Notes (Signed)
LCSW Group Therapy Note  11/23/2017 1:15pm  Type of Therapy and Topic:  Group Therapy: Avoiding Self-Sabotaging and Enabling Behaviors  Participation Level:  Did Not Attend--pt invited. Chose to remain in bed.   Description of Group:   In this group, patients will learn how to identify obstacles, self-sabotaging and enabling behaviors, as well as: what are they, why do we do them and what needs these behaviors meet. Discuss unhealthy relationships and how to have positive healthy boundaries with those that sabotage and enable. Explore aspects of self-sabotage and enabling in yourself and how to limit these self-destructive behaviors in everyday life.   Therapeutic Goals: 1. Patient will identify one obstacle that relates to self-sabotage and enabling behaviors 2. Patient will identify one personal self-sabotaging or enabling behavior they did prior to admission 3. Patient will state a plan to change the above identified behavior 4. Patient will demonstrate ability to communicate their needs through discussion and/or role play.   Summary of Patient Progress:  x   Therapeutic Modalities:   Cognitive Behavioral Therapy Person-Centered Therapy Motivational Interviewing   [employer], LCSW 11/23/2017 11:41 AM

## 2017-11-24 MED ORDER — ARIPIPRAZOLE 10 MG PO TABS: 10.0000 mg | ORAL_TABLET | Freq: Every day | ORAL | 0 refills | Status: DC

## 2017-11-24 MED ORDER — HYDROXYZINE HCL 50 MG PO TABS: 50.0000 mg | ORAL_TABLET | Freq: Four times a day (QID) | ORAL | 0 refills | Status: DC | PRN

## 2017-11-24 MED ORDER — GABAPENTIN 300 MG PO CAPS: 300.0000 mg | ORAL_CAPSULE | Freq: Three times a day (TID) | ORAL | 0 refills | Status: DC

## 2017-11-24 MED ORDER — TRAZODONE HCL 50 MG PO TABS: 50.0000 mg | ORAL_TABLET | Freq: Every evening | ORAL | 0 refills | Status: DC | PRN

## 2017-11-24 NOTE — BHH Suicide Risk Assessment (Signed)
[facility] Discharge Suicide Risk Assessment   Principal Problem: MDD (major depressive disorder) Discharge Diagnoses:  Patient Active Problem List   Diagnosis Date Noted  . PTSD (post-traumatic stress disorder) [F43.10] 11/21/2017  . MDD (major depressive disorder) [F32.9] 11/20/2017  . Major depressive disorder, recurrent episode, moderate (HCC) [F33.1] 11/05/2017  . Community acquired pneumonia of right lower lobe of lung (HCC) [J18.1] 09/02/2017  . Sepsis associated hypotension (HCC) [A41.9, I95.9] 09/02/2017  . AKI (acute kidney injury) (HCC) [N17.9] 09/02/2017  . Nausea and vomiting [R11.2] 09/02/2017  . Drug overdose [T50.901A] 04/22/2017  . Chest pain [R07.9] 10/09/2016  . Pressure injury of skin [L89.90] 10/05/2016  . Polysubstance dependence including opioid type drug with complication, episodic abuse (HCC) [F19.20] 07/21/2016  . MDD (major depressive disorder), recurrent severe, without psychosis (HCC) [F33.2] 07/21/2016  . Chronic pain [G89.29] 07/03/2015  . Diarrhea [R19.7] 05/01/2014  . Essential hypertension, benign [I10] 04/17/2014    Total Time spent with patient: 30 minutes  Musculoskeletal: Strength & Muscle Tone: within normal limits Gait & Station: normal Patient leans: N/A  Psychiatric Specialty Exam: Review of Systems  Constitutional: Negative for chills and fever.  Respiratory: Negative for cough.   Cardiovascular: Negative for chest pain.  Gastrointestinal: Negative for abdominal pain, heartburn, nausea and vomiting.  Psychiatric/Behavioral: Negative for depression, hallucinations and suicidal ideas. The patient is not nervous/anxious.     Blood pressure (!) 143/58, pulse 84, temperature 97.7 F (36.5 C), temperature source Oral, resp. rate 16, height 5' 2.5" (1.588 m), weight 111.1 kg (245 lb), SpO2 100 %.Body mass index is 44.1 kg/m.  General Appearance: Casual and Fairly Groomed  Patent attorney::  Good  Speech:  Clear and Coherent and Normal Rate   Volume:  Normal  Mood:  Euthymic  Affect:  Appropriate and Congruent  Thought Process:  Coherent and Goal Directed  Orientation:  Full (Time, Place, and Person)  Thought Content:  Logical  Suicidal Thoughts:  No  Homicidal Thoughts:  No  Memory:  Immediate;   Good Recent;   Good Remote;   Good  Judgement:  Fair  Insight:  Fair  Psychomotor Activity:  Normal  Concentration:  Good  Recall:  Fair  Fund of Knowledge:Fair  Language: Fair  Akathisia:  No  Handed:    AIMS (if indicated):     Assets:  Communication Skills Resilience Social Support  Sleep:  Number of Hours: 6.75  Cognition: WNL  ADL's:  Intact   Mental Status Per Nursing Assessment::   On Admission:     Demographic Factors:  Caucasian and Low socioeconomic status  Loss Factors: Financial problems/change in socioeconomic status  Historical Factors: Family history of mental illness or substance abuse and Impulsivity  Risk Reduction Factors:   Positive social support, Positive therapeutic relationship and Positive coping skills or problem solving skills  Continued Clinical Symptoms:  Depression:   Impulsivity Alcohol/Substance Abuse/Dependencies  Cognitive Features That Contribute To Risk:  None    Suicide Risk:  Minimal: No identifiable suicidal ideation.  Patients presenting with no risk factors but with morbid ruminations; may be classified as minimal risk based on the severity of the depressive symptoms  Follow-up Information    [facility] Follow up.   Why:  Please call Valora Corporal to arrange admission/initial assessment at discharge. She is willing to accept you back into the program. Thank you.  Contact information: [address] [place] [address] [phone]        [facility] Follow up.   Specialty:  Addiction Medicine Why:  You have been referred for residential treatment and are on waiting list. If you are still interested in  treatment at this facility, please call Shayla in admissions daily to check status of referral. Thank you.  Contact information: [address] [place] [address] [phone]        [facility] Follow up.   Specialty:  Behavioral Health Why:  If you remain in [place], Please walk in within 3 days of hospital discharge to be assessed for outpatient mental health services including medication management. Please bring photo ID and medicaid card. Walk in hours: Mon-Fri 8am-10am. Thank you.  [address] [place] [address] [phone]         Subjective Data:  Judy Robles is a 42 y/o F with history of MDD, PTSD, and polysubstance abuse who was admitted from [facility] with worsening depression and non-adherence to her outpatient treatment regimen. Pt had been taking zoloft as an outpatient but she stopped about 1 week prior to admission. Pt requested to be restarted on several controlled substances including xanax, adderall, subutex, and ativan, but it was confirmed that patient has not been prescribed those medications since February 2019.Pt was admitted to [facility] and she was started on trial of abilify in addition to being restarted on home medication of gabapentin. She was also given PRN atarax for anxiety as well as PRN trazodone for insomnia. She was in agreement to discuss with SW team about treatment options for substance use/dependecy after discharge. She had gradual improvement of her presenting symptoms.  Today upon evaluation, pt shares, "I'm good today. I'm ready to go." She denies any specific concerns. She denies physical complaints. Her appetite is good. She is sleeping well. She denies SI/HI/AH/VH. She plans to go to [facility] to stay with family and pursue substance use treatment in that area. She has decided against going to [facility]. She agrees to continue her current treatment regimen without changes. She was able to engage in  safety planning including plan to return to [facility] or contact emergency services if she feels unable to maintain her own safety or the safety of others. Pt had no further questions, comments, or concerns.   Plan Of Care/Follow-up recommendations:   - Discharge to outpatient level of care  - MDD, recurrent, severe, without psychosis and PTSD - Contiune abilify 10mg  po qDay  -Anxiety - Continue gabapentin 300mg  po TID - Continue atarax 50mg  po q6h prn anxiety  -Insomnia - Continue trazodone 50mg  po qhs prn insomnia  Activity:  as tolerated Diet:  normal Tests:  NA Other:  see above for DC plan  , MD 11/24/2017, 9:47 AM

## 2017-11-24 NOTE — Progress Notes (Signed)
  [facility] Adult Case Management Discharge Plan :  Will you be returning to the same living situation after discharge:  No. Pt was open to returning to [facility] program at discharge.  At discharge, do you have transportation home?: Yes,  bus pass if needed. Do you have the ability to pay for your medications: Yes,  [facility]  Release of information consent forms completed and submitted to medical records by CSW.  Patient to Follow up at: Follow-up Information    [facility] Follow up.   Why:  Please call Valora Corporal to arrange admission/initial assessment at discharge. She is willing to accept you back into the program. Thank you.  Contact information: [address] [place] [address] [phone]        [facility] Follow up.   Specialty:  Addiction Medicine Why:  You have been referred for residential treatment and are on waiting list. If you are still interested in treatment at this facility, please call Shayla in admissions daily to check status of referral. Thank you.  Contact information: [address] [place] [address] [phone]        [facility] Follow up.   Specialty:  Behavioral Health Why:  If you remain in [place], Please walk in within 3 days of hospital discharge to be assessed for outpatient mental health services including medication management. Please bring photo ID and medicaid card. Walk in hours: Mon-Fri 8am-10am. Thank you.  Contact information: [address] [place] [address] [phone]           Next level of care provider has access to [facility] Link:no  Safety Planning and Suicide Prevention discussed: Yes,  SPE completed with pt; pt declined to consent to collateral contact. SPI pamphlet and Mobile crisis information provided to pt.   Have you used any form of tobacco in the last 30 days? (Cigarettes, Smokeless Tobacco, Cigars, and/or Pipes): Yes  Has patient been  referred to the Quitline?: Patient refused referral  Patient has been referred for addiction treatment: Yes  [employer], LCSW 11/24/2017, 8:39 AM

## 2017-11-24 NOTE — Progress Notes (Signed)
Patient ID: Judy Robles, female   DOB: [birth year], 42 y.o.   MRN: [id number] DAR: Pt observed in the dayroom not interacting. Pt complained of moderate anxiety, depression and severe chronic back pain-see MAR; "I don't know if anything is working for me yet." Denied SI/HI, AVH or SI. Pt was med compliant. Pt attend wrap-up group. Will continue to monitor for safety.
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
## 2017-11-24 NOTE — Progress Notes (Signed)
Recreation Therapy Notes  Date: 4.5.19 Time: 9:30 a.m. Location: [address] Dayroom       Group Topic/Focus: Stress Management   Goal Area(s) Addresses:  Patient will engage in pro-social way in yoga group with.  Patient will demonstrate no behavioral issues during group.   Behavioral Response: Appropriate   Intervention: Yoga   Clinical Observations/Feedback: Patient participated in yoga group, lead by Recreation Therapy Intern. Recreation Therapy Intern led group through various yoga poses and breathing techniques. Patient engaged in yoga practice appropriately and demonstrated no behavioral issues during group.   Sheryle Hail, Recreation Therapy Intern  Sheryle Hail 11/24/2017 11:45 AM

## 2018-04-02 ENCOUNTER — Other Ambulatory Visit: Payer: Self-pay

## 2018-04-02 ENCOUNTER — Emergency Department (HOSPITAL_COMMUNITY)
Admission: EM | Admit: 2018-04-02 | Discharge: 2018-04-02 | Disposition: A | Discharge status: Home / Self Care | Payer: Medicaid Other | Attending: Emergency Medicine | Admitting: Emergency Medicine

## 2018-04-02 ENCOUNTER — Encounter (HOSPITAL_COMMUNITY): Payer: Self-pay | Admitting: Emergency Medicine

## 2018-04-02 DIAGNOSIS — Z5321 Procedure and treatment not carried out due to patient leaving prior to being seen by health care provider: Secondary | ICD-10-CM | POA: Insufficient documentation

## 2018-04-02 DIAGNOSIS — F419 Anxiety disorder, unspecified: Secondary | ICD-10-CM | POA: Diagnosis not present

## 2018-04-02 NOTE — ED Triage Notes (Signed)
Pt reports anxiety that started about 4-5 days ago. Pt isn't sure if her medications are causing her to feel this way, pt states she has never felt this way before. Pt reports panic attacks riding down the road and feeling like jumping out of the car. Pt states "crazy thoughts" and "it is scary". Pt states SI but that she does not want to hurt herself, denies HI and hallucinations. Denies pain.

## 2018-04-02 NOTE — ED Notes (Signed)
Patient called for vitals, not in waiting room.

## 2018-04-02 NOTE — ED Notes (Signed)
Patient called, no answer in waiting room

## 2018-04-04 NOTE — ED Notes (Signed)
Follow up call made   No answer  1207  s Zakyah Yanes rn

## 2018-04-21 IMAGING — CR DG CHEST 1V PORT
1 series · 1 of 1 positions shown · non-contrast
Comparison: 09/04/2016, 02/12/2016.

CLINICAL DATA: 40-year-old presenting with acute onset of cough,
shortness of breath and hemoptysis. Current history of hypertension
and sleep apnea. Current smoker.

EXAM:
PORTABLE CHEST 1 VIEW

[AP]
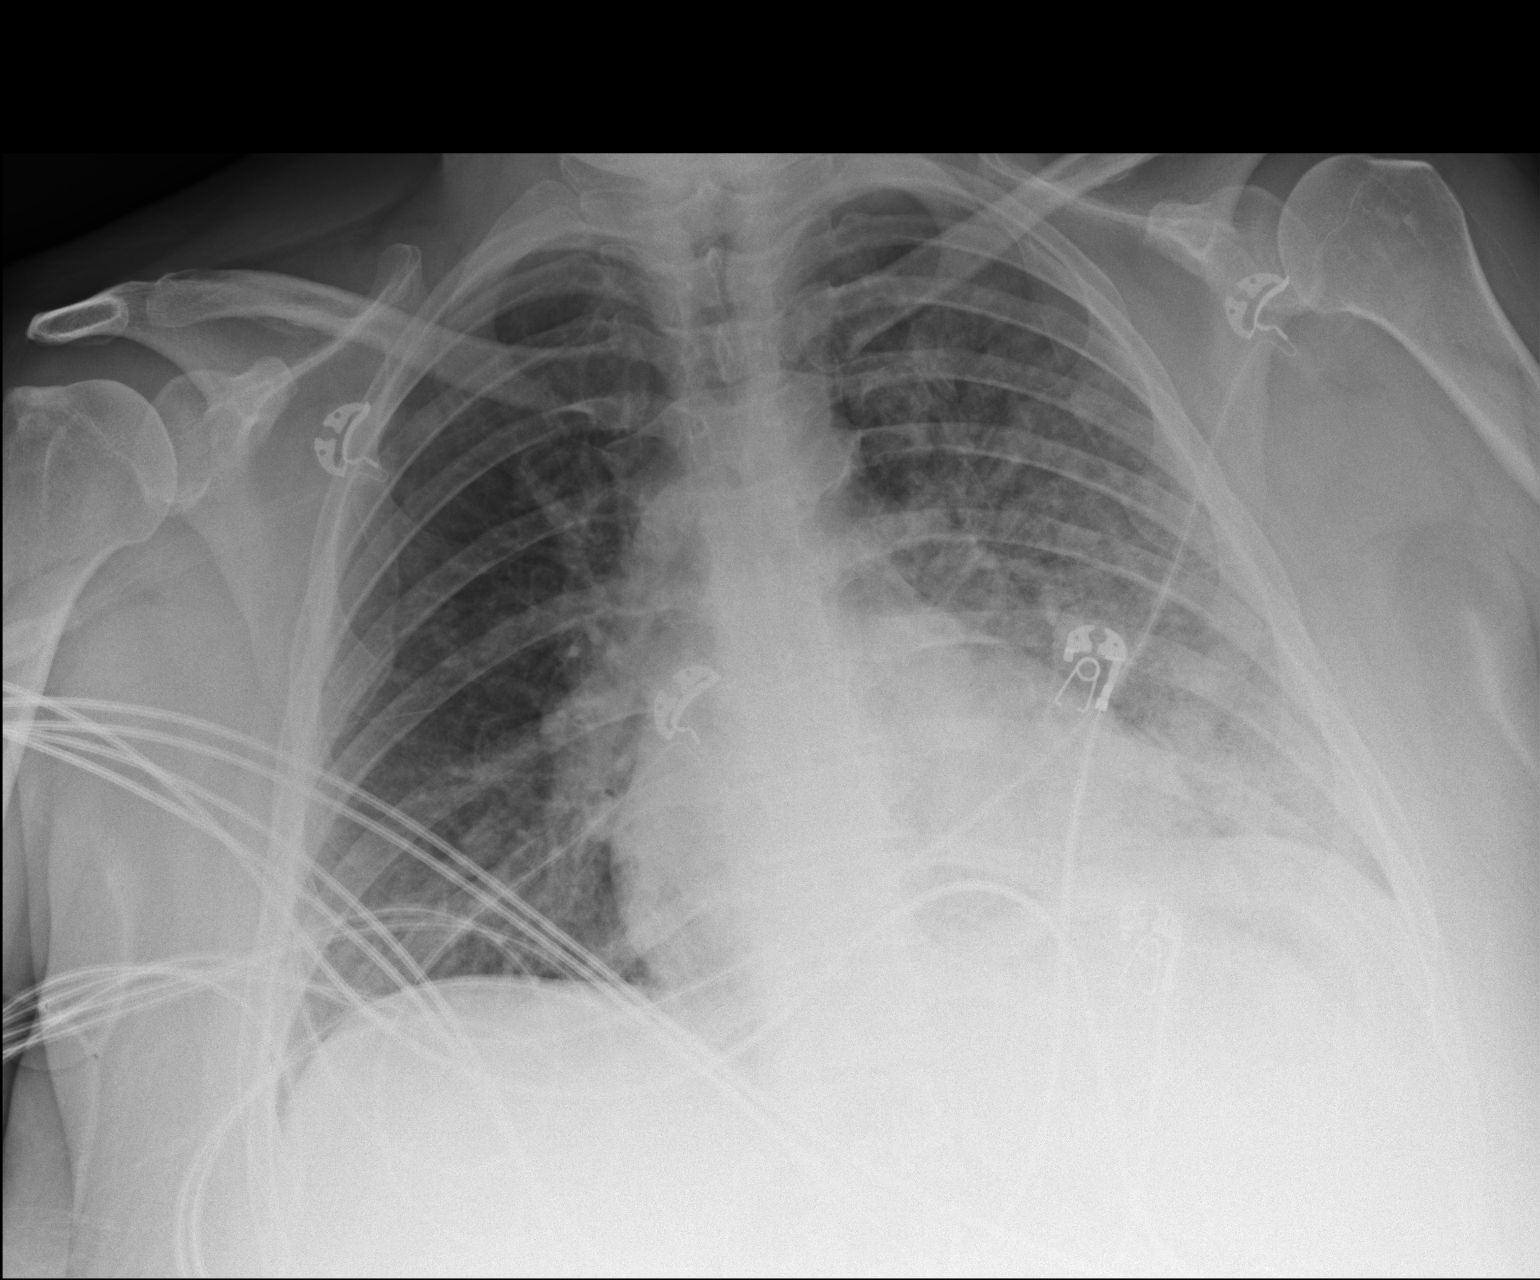

[1 of 1 positions shown; findings below may reference images not displayed]

FINDINGS: Cardiac silhouette upper normal in size for the AP portable
technique, accentuated by the less than optimal inspiration.
Asymmetric interstitial and airspace opacities involving the left
lung. Right lung clear. Possible left pleural effusion. No
pneumothorax.
IMPRESSION: Asymmetric interstitial airspace opacities throughout the left lung,
likely pneumonia, possibly pulmonary hemorrhage given the history of
hemoptysis. Asymmetric pulmonary edema is felt less likely as the
right lung is clear.

## 2018-04-21 IMAGING — CR DG CHEST 1V PORT
1 series · 1 of 1 positions shown · non-contrast
Comparison: Portable chest x-ray earlier today [DATE] a.m. and
previously.

CLINICAL DATA: Intubation. Hemoptysis earlier today. Current
history of hypertension and MRSA.

EXAM:
PORTABLE CHEST 1 VIEW [DATE] p.m.:

[AP]
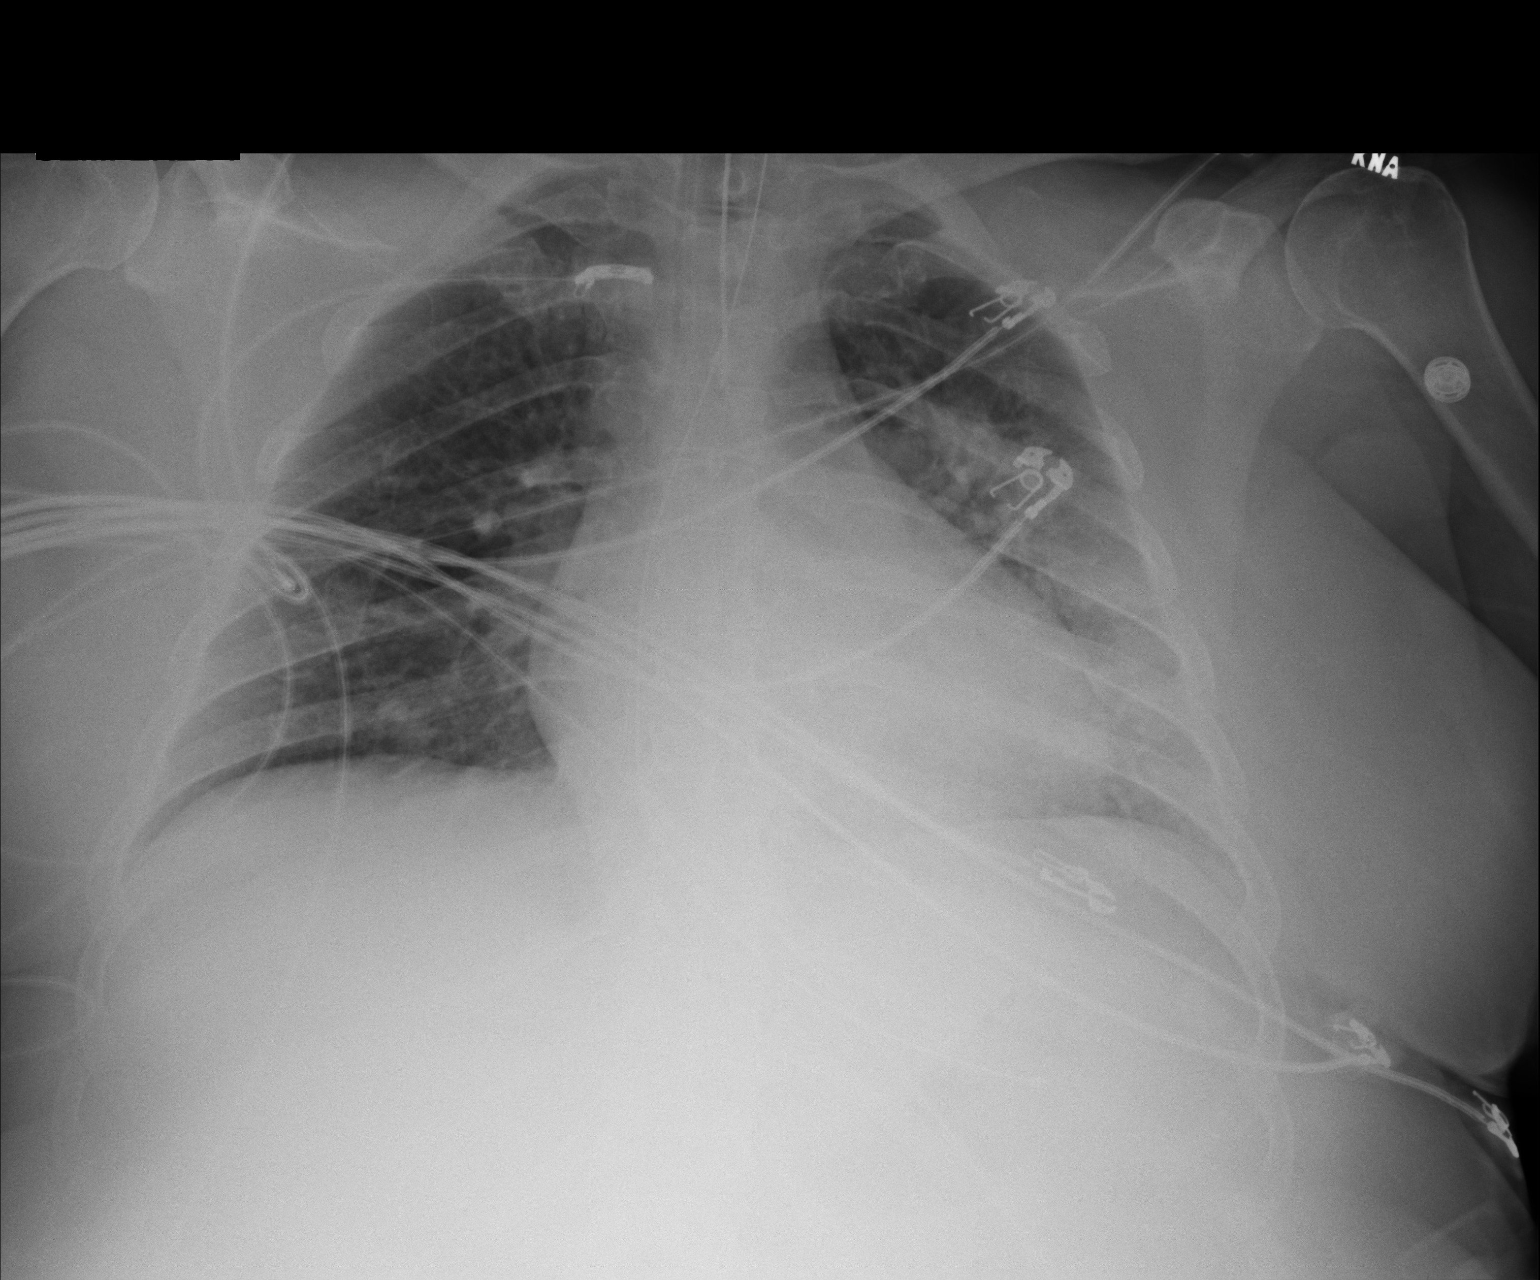

[1 of 1 positions shown; findings below may reference images not displayed]

FINDINGS: Endotracheal tube tip in satisfactory position approximately 3 cm
above the carina. Nasogastric tube tip in the fundus of the stomach.

Cardiac silhouette normal in size for AP portable technique.
Suboptimal inspiration. Improved aeration in the left lung since
earlier today, though patchy airspace opacities persist. Right lung
remains clear.
IMPRESSION: 1. Endotracheal tube tip in satisfactory position approximately 3 cm
above the carina.
2. Nasogastric tube tip in the fundus of the stomach.
3. Improved aeration in the left lung since earlier in the day,
though patchy airspace opacities persist.
4. Right lung remains clear.

## 2018-04-23 IMAGING — DX DG CHEST 1V PORT
1 series · 1 of 1 positions shown · non-contrast
Comparison: 10/04/2016

CLINICAL DATA: Pneumonia and cough.

EXAM:
PORTABLE CHEST 1 VIEW

[chest ap]
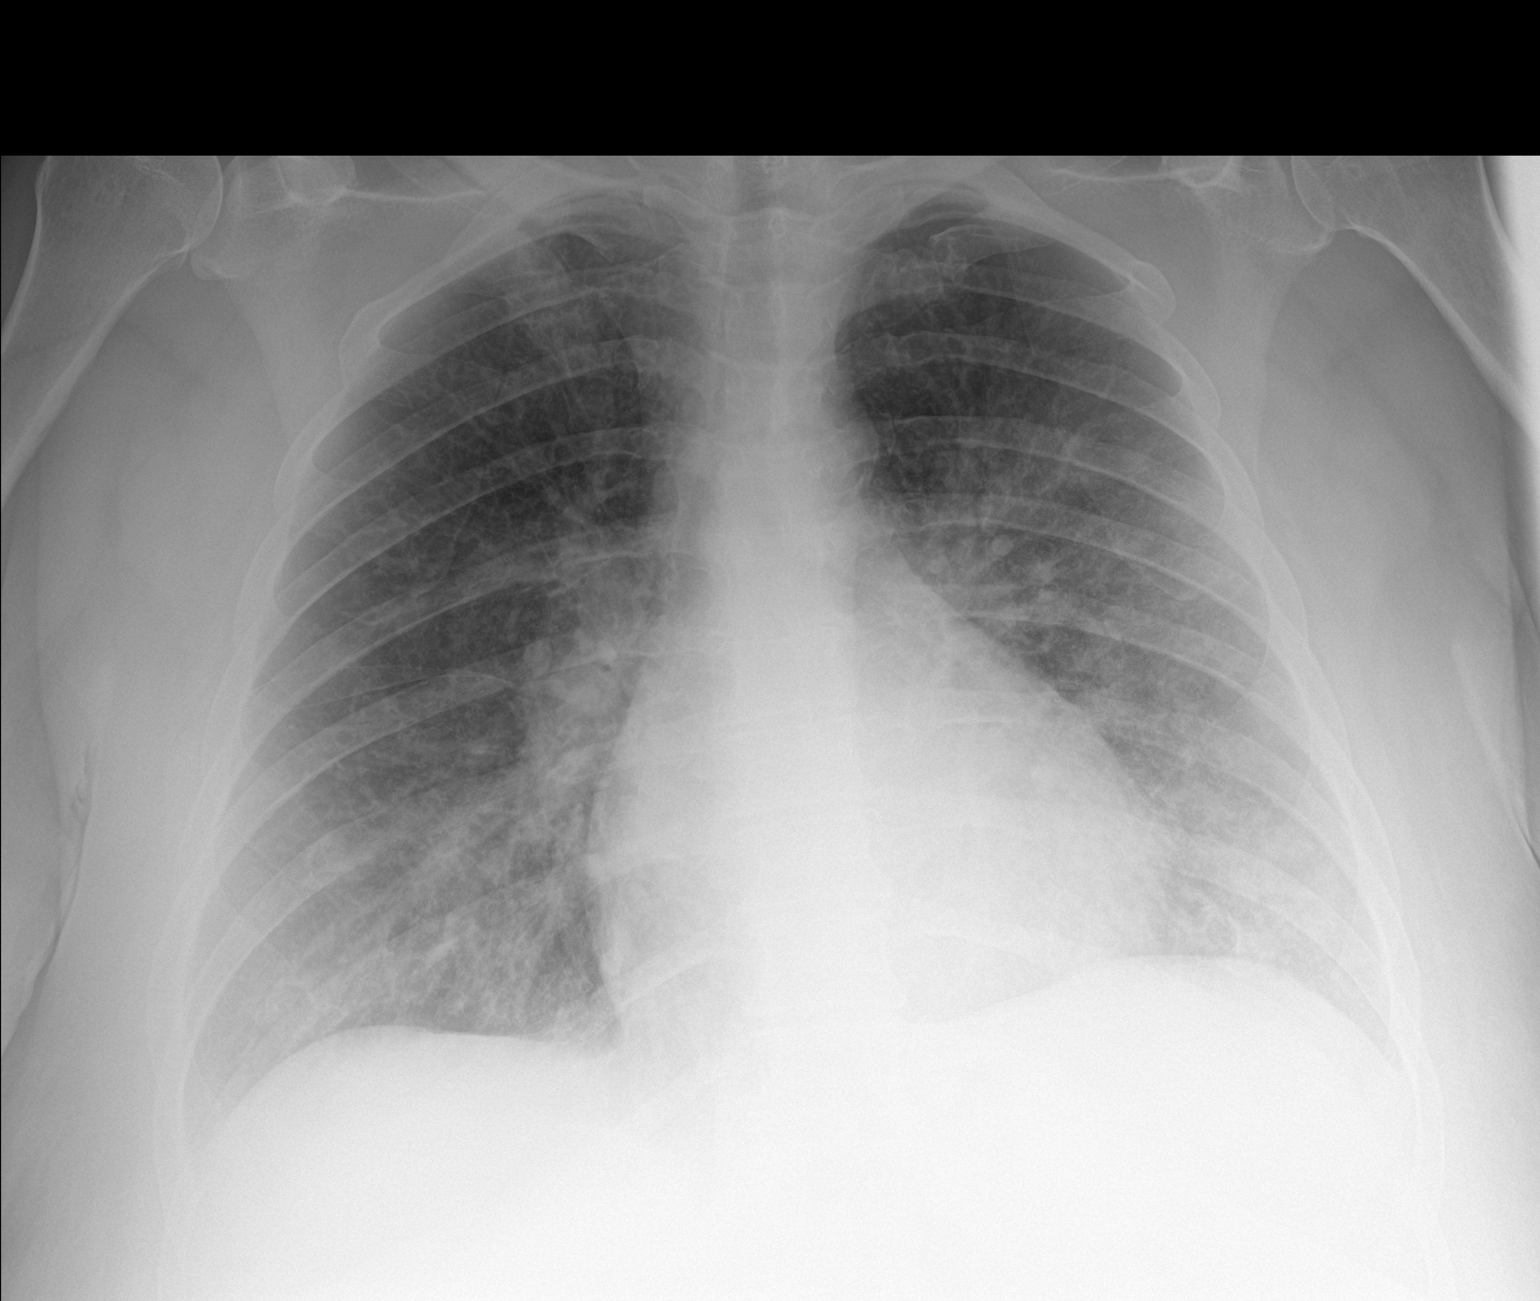

[1 of 1 positions shown; findings below may reference images not displayed]

FINDINGS: Endotracheal tube and nasogastric have been removed. There are
residual airspace densities in the mid and lower left lung. Few
peribronchial densities at the right lung base. Heart size is within
normal limits. Negative for a pneumothorax. Atherosclerotic
calcifications at the aortic arch.
IMPRESSION: Persistent subtle densities in the left lung. Findings remain
concerning for pneumonia or asymmetric edema.

## 2018-06-05 ENCOUNTER — Inpatient Hospital Stay (HOSPITAL_COMMUNITY)
Admission: AD | Admit: 2018-06-05 | Discharge: 2018-06-09 | DRG: 885 | Disposition: A | Discharge status: Home / Self Care | Payer: Medicaid Other | Source: Other Acute Inpatient Hospital | Attending: Psychiatry | Admitting: Psychiatry

## 2018-06-05 ENCOUNTER — Other Ambulatory Visit: Payer: Self-pay

## 2018-06-05 ENCOUNTER — Encounter (HOSPITAL_COMMUNITY): Payer: Self-pay | Admitting: *Deleted

## 2018-06-05 DIAGNOSIS — F410 Panic disorder [episodic paroxysmal anxiety] without agoraphobia: Secondary | ICD-10-CM | POA: Diagnosis present

## 2018-06-05 DIAGNOSIS — Z8249 Family history of ischemic heart disease and other diseases of the circulatory system: Secondary | ICD-10-CM

## 2018-06-05 DIAGNOSIS — E669 Obesity, unspecified: Secondary | ICD-10-CM | POA: Diagnosis present

## 2018-06-05 DIAGNOSIS — Z590 Homelessness: Secondary | ICD-10-CM

## 2018-06-05 DIAGNOSIS — M1990 Unspecified osteoarthritis, unspecified site: Secondary | ICD-10-CM | POA: Diagnosis present

## 2018-06-05 DIAGNOSIS — R451 Restlessness and agitation: Secondary | ICD-10-CM | POA: Diagnosis not present

## 2018-06-05 DIAGNOSIS — G4730 Sleep apnea, unspecified: Secondary | ICD-10-CM | POA: Diagnosis present

## 2018-06-05 DIAGNOSIS — R45851 Suicidal ideations: Secondary | ICD-10-CM | POA: Diagnosis not present

## 2018-06-05 DIAGNOSIS — Z811 Family history of alcohol abuse and dependence: Secondary | ICD-10-CM | POA: Diagnosis not present

## 2018-06-05 DIAGNOSIS — F332 Major depressive disorder, recurrent severe without psychotic features: Secondary | ICD-10-CM | POA: Diagnosis not present

## 2018-06-05 DIAGNOSIS — Z886 Allergy status to analgesic agent status: Secondary | ICD-10-CM | POA: Diagnosis not present

## 2018-06-05 DIAGNOSIS — F4310 Post-traumatic stress disorder, unspecified: Secondary | ICD-10-CM | POA: Diagnosis not present

## 2018-06-05 DIAGNOSIS — Z823 Family history of stroke: Secondary | ICD-10-CM | POA: Diagnosis not present

## 2018-06-05 DIAGNOSIS — F419 Anxiety disorder, unspecified: Secondary | ICD-10-CM

## 2018-06-05 DIAGNOSIS — I10 Essential (primary) hypertension: Secondary | ICD-10-CM | POA: Diagnosis not present

## 2018-06-05 DIAGNOSIS — J449 Chronic obstructive pulmonary disease, unspecified: Secondary | ICD-10-CM | POA: Diagnosis present

## 2018-06-05 DIAGNOSIS — Z888 Allergy status to other drugs, medicaments and biological substances status: Secondary | ICD-10-CM

## 2018-06-05 DIAGNOSIS — B373 Candidiasis of vulva and vagina: Secondary | ICD-10-CM | POA: Diagnosis present

## 2018-06-05 DIAGNOSIS — G4700 Insomnia, unspecified: Secondary | ICD-10-CM | POA: Diagnosis present

## 2018-06-05 DIAGNOSIS — Z6841 Body Mass Index (BMI) 40.0 and over, adult: Secondary | ICD-10-CM | POA: Diagnosis not present

## 2018-06-05 DIAGNOSIS — Z818 Family history of other mental and behavioral disorders: Secondary | ICD-10-CM | POA: Diagnosis not present

## 2018-06-05 DIAGNOSIS — Z808 Family history of malignant neoplasm of other organs or systems: Secondary | ICD-10-CM | POA: Diagnosis not present

## 2018-06-05 DIAGNOSIS — Z23 Encounter for immunization: Secondary | ICD-10-CM

## 2018-06-05 DIAGNOSIS — F17210 Nicotine dependence, cigarettes, uncomplicated: Secondary | ICD-10-CM | POA: Diagnosis present

## 2018-06-05 DIAGNOSIS — F3181 Bipolar II disorder: Secondary | ICD-10-CM

## 2018-06-05 MED ORDER — PAROXETINE HCL 10 MG PO TABS
10.0000 mg | ORAL_TABLET | Freq: Every day | ORAL | Status: DC
  Administered 2018-06-05 – 2018-06-07 (×3): 10 mg via ORAL
  Filled 2018-06-05 (×7): qty 1

## 2018-06-05 MED ORDER — GABAPENTIN 100 MG PO CAPS
100.0000 mg | ORAL_CAPSULE | Freq: Three times a day (TID) | ORAL | Status: DC
  Administered 2018-06-05 – 2018-06-06 (×3): 100 mg via ORAL
  Filled 2018-06-05 (×10): qty 1

## 2018-06-05 MED ORDER — IBUPROFEN 600 MG PO TABS
600.0000 mg | ORAL_TABLET | Freq: Four times a day (QID) | ORAL | Status: DC | PRN
  Administered 2018-06-05 – 2018-06-08 (×2): 600 mg via ORAL
  Filled 2018-06-05 (×2): qty 1

## 2018-06-05 MED ORDER — ALUM & MAG HYDROXIDE-SIMETH 200-200-20 MG/5ML PO SUSP: 30.0000 mL | ORAL | Status: DC | PRN

## 2018-06-05 MED ORDER — LOPERAMIDE HCL 2 MG PO CAPS: 2.0000 mg | ORAL_CAPSULE | ORAL | Status: AC | PRN

## 2018-06-05 MED ORDER — METHOCARBAMOL 750 MG PO TABS
750.0000 mg | ORAL_TABLET | Freq: Four times a day (QID) | ORAL | Status: DC | PRN
  Administered 2018-06-05 – 2018-06-08 (×6): 750 mg via ORAL
  Filled 2018-06-05 (×6): qty 1

## 2018-06-05 MED ORDER — TRAZODONE HCL 50 MG PO TABS
50.0000 mg | ORAL_TABLET | Freq: Every evening | ORAL | Status: DC | PRN
  Administered 2018-06-05 (×2): 50 mg via ORAL
  Filled 2018-06-05 (×8): qty 1

## 2018-06-05 MED ORDER — HYDROXYZINE HCL 25 MG PO TABS
25.0000 mg | ORAL_TABLET | Freq: Four times a day (QID) | ORAL | Status: AC | PRN
  Administered 2018-06-05 – 2018-06-08 (×6): 25 mg via ORAL
  Filled 2018-06-05 (×6): qty 1

## 2018-06-05 MED ORDER — VITAMIN B-1 100 MG PO TABS
100.0000 mg | ORAL_TABLET | Freq: Every day | ORAL | Status: DC
  Administered 2018-06-06 – 2018-06-09 (×4): 100 mg via ORAL
  Filled 2018-06-05 (×7): qty 1

## 2018-06-05 MED ORDER — HYDROXYZINE HCL 25 MG PO TABS
25.0000 mg | ORAL_TABLET | Freq: Three times a day (TID) | ORAL | Status: DC | PRN
  Administered 2018-06-05: 25 mg via ORAL
  Filled 2018-06-05: qty 1

## 2018-06-05 MED ORDER — ADULT MULTIVITAMIN W/MINERALS CH
1.0000 | ORAL_TABLET | Freq: Every day | ORAL | Status: DC
  Administered 2018-06-05 – 2018-06-09 (×5): 1 via ORAL
  Filled 2018-06-05 (×9): qty 1

## 2018-06-05 MED ORDER — LORAZEPAM 1 MG PO TABS
1.0000 mg | ORAL_TABLET | Freq: Four times a day (QID) | ORAL | Status: DC | PRN
  Administered 2018-06-05: 1 mg via ORAL
  Filled 2018-06-05: qty 1

## 2018-06-05 MED ORDER — MAGNESIUM HYDROXIDE 400 MG/5ML PO SUSP: 30.0000 mL | Freq: Every day | ORAL | Status: DC | PRN

## 2018-06-05 MED ORDER — INFLUENZA VAC SPLIT QUAD 0.5 ML IM SUSY
0.5000 mL | PREFILLED_SYRINGE | INTRAMUSCULAR | Status: AC
  Administered 2018-06-06: 0.5 mL via INTRAMUSCULAR
  Filled 2018-06-05: qty 0.5

## 2018-06-05 MED ORDER — LURASIDONE HCL 20 MG PO TABS
20.0000 mg | ORAL_TABLET | Freq: Every day | ORAL | Status: DC
  Administered 2018-06-06 – 2018-06-09 (×4): 20 mg via ORAL
  Filled 2018-06-05 (×2): qty 7
  Filled 2018-06-05 (×5): qty 1

## 2018-06-05 MED ORDER — NICOTINE 21 MG/24HR TD PT24
21.0000 mg | MEDICATED_PATCH | Freq: Every day | TRANSDERMAL | Status: DC
  Administered 2018-06-05 – 2018-06-09 (×5): 21 mg via TRANSDERMAL
  Filled 2018-06-05 (×7): qty 1

## 2018-06-05 MED ORDER — ONDANSETRON 4 MG PO TBDP: 4.0000 mg | ORAL_TABLET | Freq: Four times a day (QID) | ORAL | Status: AC | PRN
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
## 2018-06-05 NOTE — BHH Group Notes (Signed)
Adult Psychoeducational Group Note  Date:  06/05/2018 Time:  9:23 PM  Group Topic/Focus:  Wrap-Up Group:   The focus of this group is to help patients review their daily goal of treatment and discuss progress on daily workbooks.  Participation Level:  Active  Participation Quality:  Appropriate and Attentive  Affect:  Appropriate  Cognitive:  Alert and Appropriate  Insight: Appropriate and Good  Engagement in Group:  Engaged  Modes of Intervention:  Discussion and Education  Additional Comments:  Pt attended and participated in wrap up group this evening. Pt rated their day a 4/10, due to the thought of being here at [facility] and different stressors they are experiencing. Pt goal while they are here is to go to groups.   [name] 06/05/2018, 9:23 PM

## 2018-06-05 NOTE — Progress Notes (Signed)
Recreation Therapy Notes  Animal-Assisted Activity (AAA) Program Checklist/Progress Notes Patient Eligibility Criteria Checklist & Daily Group note for Rec Tx Intervention  Date: 10.15.19 Time: 1430 Location: [address]   AAA/T Program Assumption of Risk Form signed by Engineer, production or Parent Legal Guardian YES   Patient is free of allergies or sever asthma YES   Patient reports no fear of animals YES  Patient reports no history of cruelty to animals YES   Patient understands his/her participation is voluntary YES   Patient washes hands before animal contact YES   Patient washes hands after animal contact  YES   Behavioral Response: Engaged  Education: Charity fundraiser, Appropriate Animal Interaction   Education Outcome: Acknowledges understanding/In group clarification offered/Needs additional education.   Clinical Observations/Feedback: Pt attended and participated in activity.    Caroll Rancher, LRT/CTRS         Caroll Rancher A 06/05/2018 3:43 PM

## 2018-06-05 NOTE — Plan of Care (Signed)
Progress Note  D: pt found in bed; pt compliant with medication administration. Pt states she slept well. Pt rates her depression/hopelessness/anxiety an 8/8/8 out of 10 respectively. Pt denies any physical symptoms or pain, rating her pain a 0/10. Pt states her goal for today is to concentrate on her goals and she will achieve this by being positive. Pt denies any si/hi/ah/vh and verbally agrees to approach staff if these become apparent or before harming herself while at [facility]. A: pt provided support and encouragement. Pt given medications per protocol and standing orders. Q65m safety checks implemented and continued. R: pt safe on the unit. Will continue to monitor.   Pt progressing in the following metrics  Problem: Education: Goal: Knowledge of [facility] Education information/materials will improve Outcome: Progressing Goal: Emotional status will improve Outcome: Progressing Goal: Mental status will improve Outcome: Progressing Goal: Verbalization of understanding the information provided will improve Outcome: Progressing   Problem: Activity: Goal: Sleeping patterns will improve Outcome: Progressing
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
## 2018-06-05 NOTE — [facility] Suicide Risk Assessment (Signed)
[facility] INPATIENT:  Family/Significant Other Suicide Prevention Education  Suicide Prevention Education:  Patient Refusal for Family/Significant Other Suicide Prevention Education: The patient Judy Robles has refused to provide written consent for family/significant other to be provided Family/Significant Other Suicide Prevention Education during admission and/or prior to discharge.  Physician notified.  SPE completed with patient, as patient refused to consent to family contact. Patient was encouraged to share information with support network, ask questions, and talk about any concerns relating to SPE. Patient denies access to guns/firearms and verbalized understanding of information provided. Mobile Crisis information also provided to patient.    Maeola Sarah 06/05/2018, 11:46 AM

## 2018-06-05 NOTE — [facility] Group Notes (Signed)
LCSW Group Therapy Note 06/05/2018 2:36 PM  Type of Therapy/Topic: Group Therapy: Feelings about Diagnosis  Participation Level: Active   Description of Group:  This group will allow patients to explore their thoughts and feelings about diagnoses they have received. Patients will be guided to explore their level of understanding and acceptance of these diagnoses. Facilitator will encourage patients to process their thoughts and feelings about the reactions of others to their diagnosis and will guide patients in identifying ways to discuss their diagnosis with significant others in their lives. This group will be process-oriented, with patients participating in exploration of their own experiences, giving and receiving support, and processing challenge from other group members.  Therapeutic Goals: 1. Patient will demonstrate understanding of diagnosis as evidenced by identifying two or more symptoms of the disorder 2. Patient will be able to express two feelings regarding the diagnosis 3. Patient will demonstrate their ability to communicate their needs through discussion and/or role play  Summary of Patient Progress:  Judy Robles was engaged and participated throughout the group session. Judy Robles reports that she was relieved when she learned she had a mental health diagnosis because she knew "something was wrong for years"   Therapeutic Modalities:  Cognitive Behavioral Therapy Brief Therapy Feelings Identification    Judy Robles Catalina Antigua Clinical Social Worker
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
## 2018-06-06 DIAGNOSIS — B373 Candidiasis of vulva and vagina: Secondary | ICD-10-CM

## 2018-06-06 DIAGNOSIS — G4700 Insomnia, unspecified: Secondary | ICD-10-CM

## 2018-06-06 DIAGNOSIS — R451 Restlessness and agitation: Secondary | ICD-10-CM

## 2018-06-06 DIAGNOSIS — F332 Major depressive disorder, recurrent severe without psychotic features: Principal | ICD-10-CM

## 2018-06-06 LAB — TSH: TSH: 2.489 u[IU]/mL (ref 0.350–4.500)

## 2018-06-06 LAB — LIPID PANEL
CHOL/HDL RATIO: 3.7 ratio
Cholesterol: 180 mg/dL (ref 0–200)
HDL: 49 mg/dL (ref >40)
LDL Cholesterol: 105 mg/dL — ABNORMAL HIGH (ref 0–99)
Triglycerides: 128 mg/dL (ref <150)
VLDL: 26 mg/dL (ref 0–40)

## 2018-06-06 LAB — HEMOGLOBIN A1C
Hgb A1c MFr Bld: 5.3 % (ref 4.8–5.6)
MEAN PLASMA GLUCOSE: 105.41 mg/dL

## 2018-06-06 MED ORDER — NYSTATIN 100000 UNIT/GM EX CREA
TOPICAL_CREAM | Freq: Two times a day (BID) | CUTANEOUS | Status: DC
  Administered 2018-06-06: 1 via TOPICAL
  Administered 2018-06-07 – 2018-06-08 (×3): via TOPICAL
  Administered 2018-06-09: 1 via TOPICAL
  Filled 2018-06-06: qty 15

## 2018-06-06 MED ORDER — GABAPENTIN 300 MG PO CAPS
300.0000 mg | ORAL_CAPSULE | Freq: Three times a day (TID) | ORAL | Status: DC
  Administered 2018-06-06 – 2018-06-09 (×8): 300 mg via ORAL
  Filled 2018-06-06 (×2): qty 21
  Filled 2018-06-06 (×2): qty 1
  Filled 2018-06-06: qty 21
  Filled 2018-06-06: qty 1
  Filled 2018-06-06: qty 21
  Filled 2018-06-06: qty 1
  Filled 2018-06-06: qty 21
  Filled 2018-06-06 (×7): qty 1
  Filled 2018-06-06: qty 21

## 2018-06-06 MED ORDER — TRAZODONE HCL 100 MG PO TABS
100.0000 mg | ORAL_TABLET | Freq: Every evening | ORAL | Status: DC | PRN
  Administered 2018-06-06: 100 mg via ORAL
  Filled 2018-06-06 (×7): qty 1

## 2018-06-06 MED ORDER — FLUCONAZOLE 150 MG PO TABS
150.0000 mg | ORAL_TABLET | Freq: Once | ORAL | Status: AC
  Administered 2018-06-06: 150 mg via ORAL
  Filled 2018-06-06: qty 1

## 2018-06-06 NOTE — Progress Notes (Signed)
Recreation Therapy Notes  Date: 10.16.19 Time: 0930 Location: 300 Hall Dayroom  Group Topic: Stress Management  Goal Area(s) Addresses:  Patient will verbalize importance of using healthy stress management.  Patient will identify positive emotions associated with healthy stress management.   Intervention: Stress Management  Activity :  Meditation.  LRT introduced the stress management technique of meditation.  LRT played a meditation that dealt with resilience.  Patients were to listen and follow along as meditation played.  Education:  Stress Management, Discharge Planning.   Education Outcome: Acknowledges edcuation/In group clarification offered/Needs additional education  Clinical Observations/Feedback: Pt did not attend group.    Caroll Rancher, LRT/CTRS         Caroll Rancher A 06/06/2018 11:36 AM

## 2018-06-06 NOTE — Plan of Care (Signed)
D: Patient is alert, oriented, and cooperative. Denies SI, HI, AVH, and verbally contracts for safety. Patient presents as anxious and preoccupied. Patient rates her depression 8/10 and anxiety 8/10. Patient reports many subjective physical symptoms of withdrawal. This RN observed minimal symptoms of withdrawal. Patient reported back and leg pain rated 7/10. Patient requested ativan many times. Patient requested medication for sleep, anxiety, and muscle pain/restless legs.   A: Vistaril administered per MD order. NP ordered trazodone and robaxin to treat patient complaints. Trazodone and robaxin administered per NP order. This RN informed patient that her symptoms did not meet the parameters for ativan administration. Charge nurse reinforced that ativan would not be administered when patient was unhappy with this Rn's decision. Support provided. Patient educated on safety on the unit and medications. Routine safety checks every 15 minutes. Patient stated understanding to tell nurse about any new physical symptoms. Patient understands to tell staff of any needs.     R: No adverse drug reactions noted. Patient verbally contracts for safety. Patient remains safe at this time and will continue to monitor.   Problem: Education: Goal: Knowledge of [facility] Education information/materials will improve Outcome: Progressing   Problem: Safety: Goal: Periods of time without injury will increase Outcome: Progressing  Patient is oriented to the unit. Patient remains safe and will continue to monitor.

## 2018-06-06 NOTE — Plan of Care (Signed)
Problem: Coping: Goal: Will verbalize feelings Outcome: Progressing  DAR Note: Pt A & O X4. Denies SI, HI, AVH and pain when assessed. Presents with sullen affect and depressed mood. Rates her depression 7/10, hopelessness 8/10 and depression 6/10. Reports fair sleep last night with fair appetite, low energy and poor concentration level on self inventory sheet. Pt did not attend scheduled groups this shift despite multiple prompts. C/O vaginal itching but without itching. Pt goal this shift is to "go to groups". Compliant with medications when offered. Denies side effects.  Scheduled medications given per order. Pt given Difflucan 150 mg PO X1 related to c/o vaginal itching.  Q 15 minutes safety checks continues without self harm gestures or outburst. Encouragement and emotional support offered to pt throughout this shift.  Pt receptive to care. Went off unit for meals, returned without issues. Tolerated all PO intake well. Remains safe on and off unit.

## 2018-06-06 NOTE — Tx Team (Signed)
Interdisciplinary Treatment and Diagnostic Plan Update  06/06/2018 Time of Session: 1355 CAMMY SANJURJO MRN: [id number]  Principal Diagnosis: Severe recurrent major depression without psychotic features [facility])  Secondary Diagnoses: Principal Problem:   Severe recurrent major depression without psychotic features (HCC)   Current Medications:  Current Facility-Administered Medications  Medication Dose Route Frequency Provider Last Rate Last Dose  . alum & mag hydroxide-simeth (MAALOX/MYLANTA) 200-200-20 MG/5ML suspension 30 mL  30 mL Oral Q4H PRN Nira Conn A, NP      . gabapentin (NEURONTIN) capsule 300 mg  300 mg Oral TID Money, Gerlene Burdock, FNP      . hydrOXYzine (ATARAX/VISTARIL) tablet 25 mg  25 mg Oral Q6H PRN Cobos, Rockey Situ, MD   25 mg at 06/06/18 0807  . ibuprofen (ADVIL,MOTRIN) tablet 600 mg  600 mg Oral Q6H PRN Nira Conn A, NP   600 mg at 06/05/18 0517  . loperamide (IMODIUM) capsule 2-4 mg  2-4 mg Oral PRN Cobos, Rockey Situ, MD      . lurasidone (LATUDA) tablet 20 mg  20 mg Oral Q breakfast Cobos, Rockey Situ, MD   20 mg at 06/06/18 1305  . magnesium hydroxide (MILK OF MAGNESIA) suspension 30 mL  30 mL Oral Daily PRN Nira Conn A, NP      . methocarbamol (ROBAXIN) tablet 750 mg  750 mg Oral Q6H PRN Nira Conn A, NP   750 mg at 06/05/18 2118  . multivitamin with minerals tablet 1 tablet  1 tablet Oral Daily Cobos, Rockey Situ, MD   1 tablet at 06/06/18 0807  . nicotine (NICODERM CQ - dosed in mg/24 hours) patch 21 mg  21 mg Transdermal Daily Antonieta Pert, MD   21 mg at 06/06/18 0807  . nystatin cream (MYCOSTATIN)   Topical BID Money, Gerlene Burdock, FNP      . ondansetron (ZOFRAN-ODT) disintegrating tablet 4 mg  4 mg Oral Q6H PRN Cobos, Rockey Situ, MD      . PARoxetine (PAXIL) tablet 10 mg  10 mg Oral Daily Cobos, Rockey Situ, MD   10 mg at 06/06/18 0806  . thiamine (VITAMIN B-1) tablet 100 mg  100 mg Oral Daily Cobos, Rockey Situ, MD   100 mg at 06/06/18 0806  . traZODone  (DESYREL) tablet 100 mg  100 mg Oral QHS,MR X 1 Money, Gerlene Burdock, FNP       PTA Medications: Medications Prior to Admission  Medication Sig Dispense Refill Last Dose  . gabapentin (NEURONTIN) 800 MG tablet Take 800 mg by mouth 3 (three) times daily.   06/04/2018  . naproxen (NAPROSYN) 500 MG tablet Take 500 mg by mouth 2 (two) times daily as needed (For pain.).    unknown    Patient Stressors: Financial difficulties Loss of support of family (2 sons) Occupational concerns Traumatic event  Patient Strengths: Active sense of humor Communication skills General fund of knowledge  Treatment Modalities: Medication Management, Group therapy, Case management,  1 to 1 session with clinician, Psychoeducation, Recreational therapy.   Physician Treatment Plan for Primary Diagnosis: Severe recurrent major depression without psychotic features (HCC) Long Term Goal(s): Improvement in symptoms so as ready for discharge Improvement in symptoms so as ready for discharge   Short Term Goals: Ability to identify changes in lifestyle to reduce recurrence of condition will improve Ability to maintain clinical measurements within normal limits will improve Ability to identify triggers associated with substance abuse/mental health issues will improve  Medication Management: Evaluate patient's response, side effects,  and tolerance of medication regimen.  Therapeutic Interventions: 1 to 1 sessions, Unit Group sessions and Medication administration.  Evaluation of Outcomes: Progressing  Physician Treatment Plan for Secondary Diagnosis: Principal Problem:   Severe recurrent major depression without psychotic features (HCC)  Long Term Goal(s): Improvement in symptoms so as ready for discharge Improvement in symptoms so as ready for discharge   Short Term Goals: Ability to identify changes in lifestyle to reduce recurrence of condition will improve Ability to maintain clinical measurements within normal  limits will improve Ability to identify triggers associated with substance abuse/mental health issues will improve     Medication Management: Evaluate patient's response, side effects, and tolerance of medication regimen.  Therapeutic Interventions: 1 to 1 sessions, Unit Group sessions and Medication administration.  Evaluation of Outcomes: Progressing   RN Treatment Plan for Primary Diagnosis: Severe recurrent major depression without psychotic features (HCC) Long Term Goal(s): Knowledge of disease and therapeutic regimen to maintain health will improve  Short Term Goals: Ability to identify and develop effective coping behaviors will improve and Compliance with prescribed medications will improve  Medication Management: RN will administer medications as ordered by provider, will assess and evaluate patient's response and provide education to patient for prescribed medication. RN will report any adverse and/or side effects to prescribing provider.  Therapeutic Interventions: 1 on 1 counseling sessions, Psychoeducation, Medication administration, Evaluate responses to treatment, Monitor vital signs and CBGs as ordered, Perform/monitor CIWA, COWS, AIMS and Fall Risk screenings as ordered, Perform wound care treatments as ordered.  Evaluation of Outcomes: Progressing   LCSW Treatment Plan for Primary Diagnosis: Severe recurrent major depression without psychotic features (HCC) Long Term Goal(s): Safe transition to appropriate next level of care at discharge, Engage patient in therapeutic group addressing interpersonal concerns.  Short Term Goals: Engage patient in aftercare planning with referrals and resources, Increase social support and Increase skills for wellness and recovery  Therapeutic Interventions: Assess for all discharge needs, 1 to 1 time with Social worker, Explore available resources and support systems, Assess for adequacy in community support network, Educate family and  significant other(s) on suicide prevention, Complete Psychosocial Assessment, Interpersonal group therapy.  Evaluation of Outcomes: Progressing   Progress in Treatment: Attending groups: Yes. Participating in groups: Yes. Taking medication as prescribed: Yes. Toleration medication: Yes. Family/Significant other contact made: No, will contact:  pt declined consent Patient understands diagnosis: Yes. Discussing patient identified problems/goals with staff: Yes. Medical problems stabilized or resolved: Yes. Denies suicidal/homicidal ideation: Yes. Issues/concerns per patient self-inventory: No. Other: none  New problem(s) identified: No, Describe:  none  New Short Term/Long Term Goal(s):  Patient Goals:  "get medicine regulated, find a place to stay"  Discharge Plan or Barriers:   Reason for Continuation of Hospitalization: Depression Medication stabilization  Estimated Length of Stay: 3-5 days.  Attendees: Patient: Golda Zavalza 06/06/2018   Physician: Javier Docker, MD 06/06/2018   Nursing: Quintella Reichert, RN 06/06/2018   RN Care Manager: 06/06/2018   Social Worker: Daleen Squibb, LCSW 06/06/2018   Recreational Therapist:  06/06/2018   Other:  06/06/2018   Other:  06/06/2018   Other: 06/06/2018        Scribe for Treatment Team: Lorri Frederick, LCSW 06/06/2018 3:52 PM

## 2018-06-06 NOTE — Progress Notes (Addendum)
[facility] MD Progress Note  06/06/2018 2:23 PM Judy Robles  MRN:  [id number]   Subjective: Patient reports today that she is having some leg pain that feels like it is in the muscles and it just aches.  She then she describes the pain as "itching" but it does not actually each.  She denies any suicidal or homicidal ideations.  She reports that she is upset because she is not getting her Ativan anymore even though she reports that she has been taking it on the street she reports eating well but did not sleep too well last night.  She denies any hallucinations.  Objective: Patient's chart and findings reviewed and discussed with treatment team.  Patient presents in her bed with a blanket over her head and does not sit up or turn over to look at me.  Patient seems to be upset over not having her Ativan anymore however her urine drug screen was negative for benzodiazepines and after reviewing the [place] controlled substance database she has not been prescribed any recently either.  Also noted upon entering the room there was an odor to the room and this is the only patient in this room.  Requested nurse to go speak with patient to see if this is a hygienic problem.  Nurse reports that patient is complaining of some vaginal itching but denies any discharge or pain.  Principal Problem: Severe recurrent major depression without psychotic features (HCC) Diagnosis:   Patient Active Problem List   Diagnosis Date Noted  . Severe recurrent major depression without psychotic features (HCC) [F33.2] 06/05/2018  . PTSD (post-traumatic stress disorder) [F43.10] 11/21/2017  . MDD (major depressive disorder) [F32.9] 11/20/2017  . Major depressive disorder, recurrent episode, moderate (HCC) [F33.1] 11/05/2017  . Community acquired pneumonia of right lower lobe of lung (HCC) [J18.1] 09/02/2017  . Sepsis associated hypotension (HCC) [A41.9, I95.9] 09/02/2017  . AKI (acute kidney injury) (HCC) [N17.9] 09/02/2017  .  Nausea and vomiting [R11.2] 09/02/2017  . Drug overdose [T50.901A] 04/22/2017  . Chest pain [R07.9] 10/09/2016  . Pressure injury of skin [L89.90] 10/05/2016  . Polysubstance dependence including opioid type drug with complication, episodic abuse (HCC) [F19.20] 07/21/2016  . MDD (major depressive disorder), recurrent severe, without psychosis (HCC) [F33.2] 07/21/2016  . Chronic pain [G89.29] 07/03/2015  . Diarrhea [R19.7] 05/01/2014  . Essential hypertension, benign [I10] 04/17/2014   Total Time spent with patient: 30 minutes  Past Psychiatric History: See H&P  Past Medical History:  Past Medical History:  Diagnosis Date  . Arthritis   . Depression   . Hypertension   . MRSA (methicillin resistant Staphylococcus aureus)   . Obesity   . Sleep apnea     Past Surgical History:  Procedure Laterality Date  . I&D EXTREMITY Right 04/16/2014   Procedure: IRRIGATION AND DEBRIDEMENT EXTREMITY;  Surgeon: Sharma Covert, MD;  Location: [facility] OR;  Service: Orthopedics;  Laterality: Right;   Family History:  Family History  Problem Relation Age of Onset  . Throat cancer Mother   . Hypertension Father   . Stroke Father    Family Psychiatric  History: See H&P Social History:  Social History   Substance and Sexual Activity  Alcohol Use Yes   Comment: rare, occasional     Social History   Substance and Sexual Activity  Drug Use Yes  . Types: Oxycodone   Comment: "crack" hx; Pt denied current use    Social History   Socioeconomic History  . Marital status: Single  Spouse name: Not on file  . Number of children: Not on file  . Years of education: Not on file  . Highest education level: Not on file  Occupational History  . Not on file  Social Needs  . Financial resource strain: Not on file  . Food insecurity:    Worry: Not on file    Inability: Not on file  . Transportation needs:    Medical: Not on file    Non-medical: Not on file  Tobacco Use  . Smoking status: Current  Every Day Smoker    Packs/day: 1.00    Types: Cigarettes  . Smokeless tobacco: Never Used  . Tobacco comment: 1/2PPD  Substance and Sexual Activity  . Alcohol use: Yes    Comment: rare, occasional  . Drug use: Yes    Types: Oxycodone    Comment: "crack" hx; Pt denied current use  . Sexual activity: Yes    Birth control/protection: Surgical  Lifestyle  . Physical activity:    Days per week: Not on file    Minutes per session: Not on file  . Stress: Not on file  Relationships  . Social connections:    Talks on phone: Not on file    Gets together: Not on file    Attends religious service: Not on file    Active member of club or organization: Not on file    Attends meetings of clubs or organizations: Not on file    Relationship status: Not on file  Other Topics Concern  . Not on file  Social History Narrative  . Not on file   Additional Social History:    Pain Medications: reports she wants to "get back on suboxone"  Prescriptions: reports she has been taking "oxycodone."  UDS is negative Over the Counter: denies History of alcohol / drug use?: Yes Longest period of sobriety (when/how long): unsure Negative Consequences of Use: Financial, Personal relationships, Work / School                    Sleep: Fair  Appetite:  Good  Current Medications: Current Facility-Administered Medications  Medication Dose Route Frequency Provider Last Rate Last Dose  . alum & mag hydroxide-simeth (MAALOX/MYLANTA) 200-200-20 MG/5ML suspension 30 mL  30 mL Oral Q4H PRN Nira Conn A, NP      . gabapentin (NEURONTIN) capsule 300 mg  300 mg Oral TID Money, Gerlene Burdock, FNP      . hydrOXYzine (ATARAX/VISTARIL) tablet 25 mg  25 mg Oral Q6H PRN Cobos, Rockey Situ, MD   25 mg at 06/06/18 0807  . ibuprofen (ADVIL,MOTRIN) tablet 600 mg  600 mg Oral Q6H PRN Nira Conn A, NP   600 mg at 06/05/18 0517  . loperamide (IMODIUM) capsule 2-4 mg  2-4 mg Oral PRN Cobos, Rockey Situ, MD      .  lurasidone (LATUDA) tablet 20 mg  20 mg Oral Q breakfast Cobos, Rockey Situ, MD   20 mg at 06/06/18 1305  . magnesium hydroxide (MILK OF MAGNESIA) suspension 30 mL  30 mL Oral Daily PRN Nira Conn A, NP      . methocarbamol (ROBAXIN) tablet 750 mg  750 mg Oral Q6H PRN Nira Conn A, NP   750 mg at 06/05/18 2118  . multivitamin with minerals tablet 1 tablet  1 tablet Oral Daily Cobos, Rockey Situ, MD   1 tablet at 06/06/18 0807  . nicotine (NICODERM CQ - dosed in mg/24 hours) patch 21 mg  21 mg Transdermal Daily Antonieta Pert, MD   21 mg at 06/06/18 7371  . ondansetron (ZOFRAN-ODT) disintegrating tablet 4 mg  4 mg Oral Q6H PRN Cobos, Rockey Situ, MD      . PARoxetine (PAXIL) tablet 10 mg  10 mg Oral Daily Cobos, Rockey Situ, MD   10 mg at 06/06/18 0806  . thiamine (VITAMIN B-1) tablet 100 mg  100 mg Oral Daily Cobos, Rockey Situ, MD   100 mg at 06/06/18 0806  . traZODone (DESYREL) tablet 100 mg  100 mg Oral QHS,MR X 1 Money, Gerlene Burdock, FNP        Lab Results:  Results for orders placed or performed during the hospital encounter of 06/05/18 (from the past 48 hour(s))  Hemoglobin A1c     Status: None   Collection Time: 06/06/18  7:01 AM  Result Value Ref Range   Hgb A1c MFr Bld 5.3 4.8 - 5.6 %    Comment: (NOTE) Pre diabetes:          5.7%-6.4% Diabetes:              >6.4% Glycemic control for   <7.0% adults with diabetes    Mean Plasma Glucose 105.41 mg/dL    Comment: Performed at [facility], [address], [place], [place] [address]  Lipid panel     Status: Abnormal   Collection Time: 06/06/18  7:01 AM  Result Value Ref Range   Cholesterol 180 0 - 200 mg/dL   Triglycerides 485 <462 mg/dL   HDL 49 >70 mg/dL   Total CHOL/HDL Ratio 3.7 RATIO   VLDL 26 0 - 40 mg/dL   LDL Cholesterol 350 (H) 0 - 99 mg/dL    Comment:        Total Cholesterol/HDL:CHD Risk Coronary Heart Disease Risk Table                     Men   Women  1/2 Average Risk   3.4   3.3  Average Risk       5.0    4.4  2 X Average Risk   9.6   7.1  3 X Average Risk  23.4   11.0        Use the calculated Patient Ratio above and the CHD Risk Table to determine the patient's CHD Risk.        ATP III CLASSIFICATION (LDL):  <100     mg/dL   Optimal  093-818  mg/dL   Near or Above                    Optimal  130-159  mg/dL   Borderline  299-371  mg/dL   High  >696     mg/dL   Very High Performed at [facility], [address] [place], [place] [address]   TSH     Status: None   Collection Time: 06/06/18  7:01 AM  Result Value Ref Range   TSH 2.489 0.350 - 4.500 uIU/mL    Comment: Performed by a 3rd Generation assay with a functional sensitivity of <=0.01 uIU/mL. Performed at [facility], [address], [place], [place] [address]     Blood Alcohol level:  Lab Results  Component Value Date   [facility] 2 <10 11/05/2017   ETH <10 06/20/2017    Metabolic Disorder Labs: Lab Results  Component Value Date   HGBA1C 5.3 06/06/2018   MPG 105.41  06/06/2018   MPG 114 04/16/2014   No results found for: PROLACTIN Lab Results  Component Value Date   CHOL 180 06/06/2018   TRIG 128 06/06/2018   HDL 49 06/06/2018   CHOLHDL 3.7 06/06/2018   VLDL 26 06/06/2018   LDLCALC 105 (H) 06/06/2018   LDLCALC 50 09/05/2017    Physical Findings: AIMS: Facial and Oral Movements Muscles of Facial Expression: None, normal Lips and Perioral Area: None, normal Jaw: None, normal Tongue: None, normal,Extremity Movements Upper (arms, wrists, hands, fingers): None, normal Lower (legs, knees, ankles, toes): None, normal, Trunk Movements Neck, shoulders, hips: None, normal, Overall Severity Severity of abnormal movements (highest score from questions above): None, normal Incapacitation due to abnormal movements: None, normal Patient's awareness of abnormal movements (rate only patient's report): No Awareness, Dental Status Current problems with teeth and/or dentures?: No Does  patient usually wear dentures?: No  CIWA:  CIWA-Ar Total: 0 COWS:  COWS Total Score: 3  Musculoskeletal: Strength & Muscle Tone: within normal limits Gait & Station: normal Patient leans: N/A  Psychiatric Specialty Exam: Physical Exam  Nursing note and vitals reviewed. Constitutional: She is oriented to person, place, and time. She appears well-developed and well-nourished.  Cardiovascular: Normal rate.  Respiratory: Effort normal.  Musculoskeletal: Normal range of motion.  Neurological: She is alert and oriented to person, place, and time.  Skin: Skin is warm.    Review of Systems  Constitutional: Negative.   HENT: Negative.   Eyes: Negative.   Respiratory: Negative.   Cardiovascular: Negative.   Gastrointestinal: Negative.   Genitourinary: Negative.        Vaginal itching  Musculoskeletal: Positive for myalgias.  Skin: Negative.   Neurological: Negative.   Endo/Heme/Allergies: Negative.   Psychiatric/Behavioral: Positive for depression. Negative for hallucinations and suicidal ideas.    Blood pressure (!) 114/52, pulse 77, temperature 97.9 F (36.6 C), temperature source Oral, resp. rate 20, height 5' 2.5" (1.588 m), weight 129.3 kg.Body mass index is 51.3 kg/m.  General Appearance: Disheveled  Eye Contact:  None kept head covered with a blanket  Speech:  Clear and Coherent and Normal Rate  Volume:  Decreased  Mood:  Depressed  Affect:  Flat  Thought Process:  Linear and Descriptions of Associations: Intact  Orientation:  Full (Time, Place, and Person)  Thought Content:  WDL  Suicidal Thoughts:  No  Homicidal Thoughts:  No  Memory:  Immediate;   Good Recent;   Good Remote;   Good  Judgement:  Fair  Insight:  Fair  Psychomotor Activity:  Normal  Concentration:  Concentration: Good and Attention Span: Good  Recall:  Good  Fund of Knowledge:  Good  Language:  Good  Akathisia:  No  Handed:  Right  AIMS (if indicated):     Assets:  Communication  Skills Resilience  ADL's:  Intact  Cognition:  WNL  Sleep:  Number of Hours: 5.5   Problems addressed MDD severe recurrent Vaginal candidiasis Insomnia  Treatment Plan Summary: Daily contact with patient to assess and evaluate symptoms and progress in treatment, Medication management and Plan is to: Increase Neurontin to 300 mg p.o. 3 times daily to assist with mood stability as well as leg pain Continue Vistaril 25 mg p.o. every 6 hours as needed for anxiety or agitation Continue the Latuda 20 mg p.o. daily for mood stability Continue Paxil 10 mg p.o. daily for mood stability Increase trazodone to 100 mg p.o. nightly as needed for insomnia One-time dose of Diflucan 150 mg for  vaginal candidiasis Start nystatin ointment and groin area for vaginal itching  Maryfrances Bunnell, FNP 06/06/2018, 2:23 PM   ..Agree with NP Progress Note

## 2018-06-06 NOTE — [facility] Group Notes (Signed)
[facility] Group Therapy 06/06/2018 1:15pm  Type of Therapy: Mental Health Association Presentation  Participation Level: Pt invited. Chose to remain in bed. Did not attend.   Rona Ravens, LCSW 06/06/2018 11:05 AM

## 2018-06-07 MED ORDER — PAROXETINE HCL 20 MG PO TABS
20.0000 mg | ORAL_TABLET | Freq: Every day | ORAL | Status: DC
  Administered 2018-06-08 – 2018-06-09 (×2): 20 mg via ORAL
  Filled 2018-06-07: qty 1
  Filled 2018-06-07: qty 7
  Filled 2018-06-07: qty 1
  Filled 2018-06-07: qty 7
  Filled 2018-06-07: qty 1

## 2018-06-07 MED ORDER — TRAZODONE HCL 100 MG PO TABS
100.0000 mg | ORAL_TABLET | Freq: Every evening | ORAL | Status: DC | PRN
  Administered 2018-06-07 – 2018-06-08 (×2): 100 mg via ORAL
  Filled 2018-06-07: qty 1
  Filled 2018-06-07: qty 7
  Filled 2018-06-07: qty 1

## 2018-06-07 NOTE — Progress Notes (Signed)
D: Patient denies SI, HI or AVH. Patient presents as flat and depressed but cooperative.  She states that she slept well with the assistance of sleep medication.  Pt. States she is feeling much better today and reports withdrawal symptoms are minimal.  Pt. Is visualized in the dayroom interacting with staff and her peers. She rates her anxiety 3/10 and depression as 7/10.  Pt. States that her goal for today is to stay out of bed and attend group.    A: Patient given emotional support from RN. Patient encouraged to come to staff with concerns and/or questions. Patient's medication routine continued. Patient's orders and plan of care reviewed.   R: Patient remains appropriate and cooperative. Will continue to monitor patient q15 minutes for safety.

## 2018-06-07 NOTE — Progress Notes (Signed)
CSW brought information regarding [facility] and [facility] to pt and discussed both programs.  Pt reports that her photo ID is in [place] at the place where she has been staying and she will need to retreive it.  Pt will look at information and think about both options.  CSW left message with [facility] to check on available beds. Garner Nash, MSW, LCSW Clinical Social Worker 06/07/2018 10:33 AM

## 2018-06-07 NOTE — Progress Notes (Signed)
[facility] MD Progress Note  06/07/2018 1:51 PM Judy Robles  MRN:  [id number]   Subjective:  Patient reports that she is feeling less depressed and " a little better". Today denies suicidal ideations. Denies medication side effects.  Objective:  I have discussed case with treatment team and have met with patient. 42 year old female, presented to the hospital due to worsening depression, anxiety, suicidal thoughts with recent thoughts of jumping out of a car.  Patient significant stressors including unemployment, homelessness, staying in an unsafe environment.  She reports limited compliance with her psychiatric medications recently, does states she has been taking Neurontin which she started about a week ago.  She recently started taking Xanax from a prior prescription and has been taking opiate analgesics (nonprescribed) irregularly (2-3 times a week) At this time patient reports partially improved mood and affect presents less constricted, a little more reactive. Reports mild opiate WDL symptoms ( nausea, vomited x 1 yesterday, some loose stools). Symptoms now improving/resolving . Currently presents calm, comfortable, in no acute distress or discomfort. No restlessness or psychomotor agitation, no distal tremors,no diaphoresis. She has been visible in day room, going to some groups, no disruptive or agitated behaviors on unit. Denies medication side effects. Denies suicidal ideations. Principal Problem: Severe recurrent major depression without psychotic features (HCC) Diagnosis:   Patient Active Problem List   Diagnosis Date Noted  . Severe recurrent major depression without psychotic features (HCC) [F33.2] 06/05/2018  . PTSD (post-traumatic stress disorder) [F43.10] 11/21/2017  . MDD (major depressive disorder) [F32.9] 11/20/2017  . Major depressive disorder, recurrent episode, moderate (HCC) [F33.1] 11/05/2017  . Community acquired pneumonia of right lower lobe of lung (HCC) [J18.1] 09/02/2017   . Sepsis associated hypotension (HCC) [A41.9, I95.9] 09/02/2017  . AKI (acute kidney injury) (HCC) [N17.9] 09/02/2017  . Nausea and vomiting [R11.2] 09/02/2017  . Drug overdose [T50.901A] 04/22/2017  . Chest pain [R07.9] 10/09/2016  . Pressure injury of skin [L89.90] 10/05/2016  . Polysubstance dependence including opioid type drug with complication, episodic abuse (HCC) [F19.20] 07/21/2016  . MDD (major depressive disorder), recurrent severe, without psychosis (HCC) [F33.2] 07/21/2016  . Chronic pain [G89.29] 07/03/2015  . Diarrhea [R19.7] 05/01/2014  . Essential hypertension, benign [I10] 04/17/2014   Total Time spent with patient: 20 minutes  Past Psychiatric History: See H&P  Past Medical History:  Past Medical History:  Diagnosis Date  . Arthritis   . Depression   . Hypertension   . MRSA (methicillin resistant Staphylococcus aureus)   . Obesity   . Sleep apnea     Past Surgical History:  Procedure Laterality Date  . I&D EXTREMITY Right 04/16/2014   Procedure: IRRIGATION AND DEBRIDEMENT EXTREMITY;  Surgeon: Sharma Covert, MD;  Location: [facility] OR;  Service: Orthopedics;  Laterality: Right;   Family History:  Family History  Problem Relation Age of Onset  . Throat cancer Mother   . Hypertension Father   . Stroke Father    Family Psychiatric  History: See H&P Social History:  Social History   Substance and Sexual Activity  Alcohol Use Yes   Comment: rare, occasional     Social History   Substance and Sexual Activity  Drug Use Yes  . Types: Oxycodone   Comment: "crack" hx; Pt denied current use    Social History   Socioeconomic History  . Marital status: Single    Spouse name: Not on file  . Number of children: Not on file  . Years of education: Not on file  .  Highest education level: Not on file  Occupational History  . Not on file  Social Needs  . Financial resource strain: Not on file  . Food insecurity:    Worry: Not on file    Inability: Not on  file  . Transportation needs:    Medical: Not on file    Non-medical: Not on file  Tobacco Use  . Smoking status: Current Every Day Smoker    Packs/day: 1.00    Types: Cigarettes  . Smokeless tobacco: Never Used  . Tobacco comment: 1/2PPD  Substance and Sexual Activity  . Alcohol use: Yes    Comment: rare, occasional  . Drug use: Yes    Types: Oxycodone    Comment: "crack" hx; Pt denied current use  . Sexual activity: Yes    Birth control/protection: Surgical  Lifestyle  . Physical activity:    Days per week: Not on file    Minutes per session: Not on file  . Stress: Not on file  Relationships  . Social connections:    Talks on phone: Not on file    Gets together: Not on file    Attends religious service: Not on file    Active member of club or organization: Not on file    Attends meetings of clubs or organizations: Not on file    Relationship status: Not on file  Other Topics Concern  . Not on file  Social History Narrative  . Not on file   Additional Social History:    Pain Medications: reports she wants to "get back on suboxone"  Prescriptions: reports she has been taking "oxycodone."  UDS is negative Over the Counter: denies History of alcohol / drug use?: Yes Longest period of sobriety (when/how long): unsure Negative Consequences of Use: Financial, Personal relationships, Work / School  Sleep: Fair  Appetite:  Good  Current Medications: Current Facility-Administered Medications  Medication Dose Route Frequency Provider Last Rate Last Dose  . alum & mag hydroxide-simeth (MAALOX/MYLANTA) 200-200-20 MG/5ML suspension 30 mL  30 mL Oral Q4H PRN Nira Conn A, NP      . gabapentin (NEURONTIN) capsule 300 mg  300 mg Oral TID Money, Feliz Beam B, FNP   300 mg at 06/07/18 1324  . hydrOXYzine (ATARAX/VISTARIL) tablet 25 mg  25 mg Oral Q6H PRN Nyeshia Mysliwiec, Rockey Situ, MD   25 mg at 06/07/18 0754  . ibuprofen (ADVIL,MOTRIN) tablet 600 mg  600 mg Oral Q6H PRN Nira Conn A, NP    600 mg at 06/05/18 0517  . loperamide (IMODIUM) capsule 2-4 mg  2-4 mg Oral PRN Lucill Mauck, Rockey Situ, MD      . lurasidone (LATUDA) tablet 20 mg  20 mg Oral Q breakfast Toshi Ishii, Rockey Situ, MD   20 mg at 06/07/18 0751  . magnesium hydroxide (MILK OF MAGNESIA) suspension 30 mL  30 mL Oral Daily PRN Nira Conn A, NP      . methocarbamol (ROBAXIN) tablet 750 mg  750 mg Oral Q6H PRN Nira Conn A, NP   750 mg at 06/07/18 0753  . multivitamin with minerals tablet 1 tablet  1 tablet Oral Daily Teigan Manner, Rockey Situ, MD   1 tablet at 06/07/18 0751  . nicotine (NICODERM CQ - dosed in mg/24 hours) patch 21 mg  21 mg Transdermal Daily Antonieta Pert, MD   21 mg at 06/07/18 0755  . nystatin cream (MYCOSTATIN)   Topical BID Money, Gerlene Burdock, FNP      . ondansetron (ZOFRAN-ODT) disintegrating tablet  4 mg  4 mg Oral Q6H PRN Elloise Roark, Rockey Situ, MD      . PARoxetine (PAXIL) tablet 10 mg  10 mg Oral Daily Philamena Kramar, Rockey Situ, MD   10 mg at 06/07/18 0751  . thiamine (VITAMIN B-1) tablet 100 mg  100 mg Oral Daily Aamori Mcmasters, Rockey Situ, MD   100 mg at 06/07/18 0751  . traZODone (DESYREL) tablet 100 mg  100 mg Oral QHS,MR X 1 Money, Gerlene Burdock, FNP   100 mg at 06/06/18 2102    Lab Results:  Results for orders placed or performed during the hospital encounter of 06/05/18 (from the past 48 hour(s))  Hemoglobin A1c     Status: None   Collection Time: 06/06/18  7:01 AM  Result Value Ref Range   Hgb A1c MFr Bld 5.3 4.8 - 5.6 %    Comment: (NOTE) Pre diabetes:          5.7%-6.4% Diabetes:              >6.4% Glycemic control for   <7.0% adults with diabetes    Mean Plasma Glucose 105.41 mg/dL    Comment: Performed at [facility], [address] [place], [place] [address]  Lipid panel     Status: Abnormal   Collection Time: 06/06/18  7:01 AM  Result Value Ref Range   Cholesterol 180 0 - 200 mg/dL   Triglycerides 440 <102 mg/dL   HDL 49 >72 mg/dL   Total CHOL/HDL Ratio 3.7 RATIO   VLDL 26 0 - 40 mg/dL   LDL  Cholesterol 536 (H) 0 - 99 mg/dL    Comment:        Total Cholesterol/HDL:CHD Risk Coronary Heart Disease Risk Table                     Men   Women  1/2 Average Risk   3.4   3.3  Average Risk       5.0   4.4  2 X Average Risk   9.6   7.1  3 X Average Risk  23.4   11.0        Use the calculated Patient Ratio above and the CHD Risk Table to determine the patient's CHD Risk.        ATP III CLASSIFICATION (LDL):  <100     mg/dL   Optimal  644-034  mg/dL   Near or Above                    Optimal  130-159  mg/dL   Borderline  742-595  mg/dL   High  >638     mg/dL   Very High Performed at [facility], [address] [place], [place] [address]   TSH     Status: None   Collection Time: 06/06/18  7:01 AM  Result Value Ref Range   TSH 2.489 0.350 - 4.500 uIU/mL    Comment: Performed by a 3rd Generation assay with a functional sensitivity of <=0.01 uIU/mL. Performed at [facility], [address], [place], [place] [address]     Blood Alcohol level:  Lab Results  Component Value Date   [facility] <10 11/05/2017   ETH <10 06/20/2017    Metabolic Disorder Labs: Lab Results  Component Value Date   HGBA1C 5.3 06/06/2018   MPG 105.41 06/06/2018   MPG 114 04/16/2014   No results found for: PROLACTIN Lab Results  Component Value  Date   CHOL 180 06/06/2018   TRIG 128 06/06/2018   HDL 49 06/06/2018   CHOLHDL 3.7 06/06/2018   VLDL 26 06/06/2018   LDLCALC 105 (H) 06/06/2018   LDLCALC 50 09/05/2017    Physical Findings: AIMS: Facial and Oral Movements Muscles of Facial Expression: None, normal Lips and Perioral Area: None, normal Jaw: None, normal Tongue: None, normal,Extremity Movements Upper (arms, wrists, hands, fingers): None, normal Lower (legs, knees, ankles, toes): None, normal, Trunk Movements Neck, shoulders, hips: None, normal, Overall Severity Severity of abnormal movements (highest score from questions above): None,  normal Incapacitation due to abnormal movements: None, normal Patient's awareness of abnormal movements (rate only patient's report): No Awareness, Dental Status Current problems with teeth and/or dentures?: No Does patient usually wear dentures?: No  CIWA:  CIWA-Ar Total: 0 COWS:  COWS Total Score: 3  Musculoskeletal: Strength & Muscle Tone: within normal limits Gait & Station: normal Patient leans: N/A  Psychiatric Specialty Exam: Physical Exam  Nursing note and vitals reviewed. Constitutional: She is oriented to person, place, and time. She appears well-developed and well-nourished.  Cardiovascular: Normal rate.  Respiratory: Effort normal.  Musculoskeletal: Normal range of motion.  Neurological: She is alert and oriented to person, place, and time.  Skin: Skin is warm.    Review of Systems  Constitutional: Negative.   HENT: Negative.   Eyes: Negative.   Respiratory: Negative.   Cardiovascular: Negative.   Gastrointestinal: Negative.   Genitourinary: Negative.        Vaginal itching  Musculoskeletal: Positive for myalgias.  Skin: Negative.   Neurological: Negative.   Endo/Heme/Allergies: Negative.   Psychiatric/Behavioral: Positive for depression. Negative for hallucinations and suicidal ideas.  reports improving nausea, no vomiting today, no diarrhea today, reports improving vaginal symptoms, no fever, no chills   Blood pressure (!) 155/86, pulse 91, temperature 97.9 F (36.6 C), resp. rate 20, height 5' 2.5" (1.588 m), weight 129.3 kg.Body mass index is 51.3 kg/m.  General Appearance: improved grooming   Eye Contact:  improving   Speech:  Normal Rate  Volume:  Normal  Mood:  improving mood, describes mood as 7/10  Affect:  less constricted   Thought Process:  Linear and Descriptions of Associations: Intact  Orientation:  Other:  fully alert and attentive  Thought Content:  no hallucinations , no delusions   Suicidal Thoughts:  No denies current suicidal  ideations , denies self injurious ideations, contracts for safety on unit  Homicidal Thoughts:  No  Memory:  recent and remote grossly intact   Judgement:  Other:  improving   Insight:  improving   Psychomotor Activity:  Normal- no psychomotor agitation or restlessness   Concentration:  Concentration: Good and Attention Span: Good  Recall:  Good  Fund of Knowledge:  Good  Language:  Good  Akathisia:  No  Handed:  Right  AIMS (if indicated):     Assets:  Communication Skills Resilience  ADL's:  Intact  Cognition:  WNL  Sleep:  Number of Hours: 6.75   Assessment -  42 year old female, presented to the hospital due to worsening depression, anxiety, suicidal thoughts with recent thoughts of jumping out of a car.  Patient significant stressors including unemployment, homelessness, staying in an unsafe environment.  She reports limited compliance with her psychiatric medications recently, does states she has been taking Neurontin which she started about a week ago.  She recently started taking Xanax from a prior prescription and has been taking opiate analgesics (nonprescribed) irregularly (2-3 times  a week)  At this time patient presents with partially improved mood and range of affect, less severe depression. Denies suicidal ideations. Reports mild opiate WDL symptoms, now improving, and currently presents calm and in no acute distress or discomfort . Tolerating medications well .  Treatment Plan Summary: Daily contact with patient to assess and evaluate symptoms and progress in treatment, Medication management and Plan is to:  Treatment Plan reviewed as below today 10/17. Encourage group and milieu participation to work on Pharmacologist and symptom reduction Continue  Neurontin  300 mg p.o. 3 times daily for anxiety, pain Continue Vistaril 25 mg p.o. every 6 hours as needed for anxiety or agitation Continue  Latuda 20 mg p.o. daily for mood disorder, augmentation Increase  Paxil to 20 mg  p.o. daily for depression, anxiety  Continue Trazodone 100 mg at nighttime  as needed for insomnia   Craige Cotta, MD 06/07/2018, 1:51 PM   Patient ID: Judy Robles, female   DOB: 1975-11-25, 42 y.o.   MRN: [id number]

## 2018-06-07 NOTE — [facility] Group Notes (Signed)
[facility] LCSW Group Therapy Note  Date/Time: 06/07/18, 1315  Type of Therapy/Topic:  Group Therapy:  Balance in Life  Participation Level:  minimal  Description of Group:    This group will address the concept of balance and how it feels and looks when one is unbalanced. Patients will be encouraged to process areas in their lives that are out of balance, and identify reasons for remaining unbalanced. Facilitators will guide patients utilizing problem- solving interventions to address and correct the stressor making their life unbalanced. Understanding and applying boundaries will be explored and addressed for obtaining  and maintaining a balanced life. Patients will be encouraged to explore ways to assertively make their unbalanced needs known to significant others in their lives, using other group members and facilitator for support and feedback.  Therapeutic Goals: 1. Patient will identify two or more emotions or situations they have that consume much of in their lives. 2. Patient will identify signs/triggers that life has become out of balance:  3. Patient will identify two ways to set boundaries in order to achieve balance in their lives:  4. Patient will demonstrate ability to communicate their needs through discussion and/or role plays  Summary of Patient Progress:Pt shared that family issues and housing are currently consuming much of her life.  Pt was attentive during group but did not participate in the discussion.          Therapeutic Modalities:   Cognitive Behavioral Therapy Solution-Focused Therapy Assertiveness Training  Daleen Squibb, [place]

## 2018-06-07 NOTE — Progress Notes (Signed)
D: Pt was in dayroom upon initial approach.  Pt presents with anxious, depressed affect and mood.  She describes her day as "all right.  I've been in bed most of the day."  Her goal is to "go to sleep" tonight.  Pt denies SI/HI, denies hallucinations, denies pain.  She complains of muscle spasms in her legs and anxiety.  Pt has been visible in milieu interacting with peers and staff appropriately.    A: Met with pt 1:1.  Actively listened to pt and offered support and encouragement. Medication administered per order.  PRN medication administered for anxiety and muscle spasms.  Q15 minute safety checks maintained.  R: Pt is safe on the unit.  Pt is compliant with medications.  Pt verbally contracts for safety and reports she will inform staff of needs and concerns.  Will continue to monitor and assess.

## 2018-06-08 NOTE — Progress Notes (Signed)
D: Patient observed in dayroom this evening and attended wrap up group. Patient states, "it's been a good day. He might let me leave tomorrow. I feel ready. I feel good about that. I hope he does." Patient's affect animated, mood anxious, pleasant.  Complaining of R deltoid soreness from flu shot earlier in the week. Also states she is having back spasms.   A: Medicated per orders, prn robaxin, advil and trazadone given per her request. Medication education provided. Level III obs in place for safety. Emotional support offered. Patient encouraged to complete Suicide Safety Plan before discharge. Encouraged to attend and participate in unit programming.   R: Patient verbalizes understanding of POC. On reassess, patient is asleep. Patient denies SI/HI/AVH and remains safe on level III obs. Will continue to monitor throughout the night.

## 2018-06-08 NOTE — Progress Notes (Signed)
Recreation Therapy Notes  Date: 10.18.19 Time: 0930 Location: [address] Dayroom  Group Topic: Stress Management  Goal Area(s) Addresses:  Patient will verbalize importance of using healthy stress management.  Patient will identify positive emotions associated with healthy stress management.   Intervention: Stress Management  Activity : Progressive Muscle Relaxation.  LRT introduced the stress management technique of progressive muscle relaxation.  LRT read a script to guide patients in tensing each muscle group individually then relaxing them. Patients were to follow along as the script was read.  Education:  Stress Management, Discharge Planning.   Education Outcome: Acknowledges edcuation/In group clarification offered/Needs additional education  Clinical Observations/Feedback: Pt did not attend group.    Caroll Rancher, LRT/CTRS         Lillia Abed, Kyree Fedorko A 06/08/2018 11:21 AM

## 2018-06-08 NOTE — Plan of Care (Signed)
  Problem: Education: Goal: Emotional status will improve Outcome: Progressing Goal: Verbalization of understanding the information provided will improve Outcome: Progressing   

## 2018-06-08 NOTE — Progress Notes (Signed)
Pt presents with a flat affect and depressed mood. Pt reported decreased symptoms of depression and anxiety today. Pt denies SI/HI. Pt noted to be isolative to her room today with minimal interaction on the unit. Pt encouraged to attend groups as tolerated. Pt compliant with taking meds and denies any side effects.  Medications reviewed with pt. Verbal support provided.15 minute checks performed for safety.  Pt receptive to tx plan.

## 2018-06-08 NOTE — [facility] Group Notes (Signed)
  [facility] LCSW Group Therapy Note  Date/Time: 06/08/18, 1315  Type of Therapy/Topic:  Group Therapy:  Emotion Regulation  Participation Level:  Minimal   Mood:pleasant  Description of Group:    The purpose of this group is to assist patients in learning to regulate negative emotions and experience positive emotions. Patients will be guided to discuss ways in which they have been vulnerable to their negative emotions. These vulnerabilities will be juxtaposed with experiences of positive emotions or situations, and patients challenged to use positive emotions to combat negative ones. Special emphasis will be placed on coping with negative emotions in conflict situations, and patients will process healthy conflict resolution skills.  Therapeutic Goals: 1. Patient will identify two positive emotions or experiences to reflect on in order to balance out negative emotions:  2. Patient will label two or more emotions that they find the most difficult to experience:  3. Patient will be able to demonstrate positive conflict resolution skills through discussion or role plays:   Summary of Patient Progress:Pt shared that depression and guilt are difficult emotions to experience.  Pt attentive during group discussion but did not participate very much.  She did respond to several questions from CSW.          Therapeutic Modalities:   Cognitive Behavioral Therapy Feelings Identification Dialectical Behavioral Therapy  Daleen Squibb, LCSW

## 2018-06-08 NOTE — Progress Notes (Signed)
D: Patient observed up and visible in the milieu. Patient states she is ready for her med so that she can go to bed. Patient's affect anxious, mood congruent. Patient complaining of bilat leg spasms.   A: Medicated per orders, prn trazadone and robaxin given for discomfort, sleep. Medication education provided. Level III obs in place for safety. Emotional support offered. Patient encouraged to complete Suicide Safety Plan before discharge. Encouraged to attend and participate in unit programming.   R: Patient verbalizes understanding of POC. On reassess, patient is asleep. Patient denies SI/HI/AVH and remains safe on level III obs. Will continue to monitor throughout the night.

## 2018-06-08 NOTE — Progress Notes (Signed)
Adult Psychoeducational Group Note  Date:  06/08/2018 Time:  9:38 PM  Group Topic/Focus:  Wrap-Up Group:   The focus of this group is to help patients review their daily goal of treatment and discuss progress on daily workbooks.  Participation Level:  Active  Participation Quality:  Appropriate  Affect:  Appropriate  Cognitive:  Appropriate  Insight: Appropriate  Engagement in Group:  Engaged  Modes of Intervention:  Discussion  Additional Comments:  Pt's goal was to stay out of bed and go to all groups.  Pt stated her goals were met.  Pt rated the day at a 7/10.  Dustine Stickler 06/08/2018, 9:38 PM

## 2018-06-08 NOTE — Progress Notes (Signed)
[facility] MD Progress Note  06/08/2018 2:44 PM CHAVON DUSH  MRN:  [id number]   Subjective:  Patient reports she is feeling " a lot better". Denies suicidal ideations. As she improves she is focusing more on discharge planning and states she is hoping to discharge soon. Denies medication side effects . Denies suicidal ideations.  Objective:  I have discussed case with treatment team and have met with patient. 42 year old female, presented to the hospital due to worsening depression, anxiety, suicidal thoughts with recent thoughts of jumping out of a car.  Patient significant stressors including unemployment, homelessness, staying in an unsafe environment.  She reports limited compliance with her psychiatric medications recently. Patient reports she is feeling better, and states " I am definitely doing a lot better than when I came in ". States " I feel the medications are helping ". Denies side effects. Denies suicidal ideations. Has been visible on unit , going to groups. Interactive with peers . Principal Problem: Severe recurrent major depression without psychotic features (HCC) Diagnosis:   Patient Active Problem List   Diagnosis Date Noted  . Severe recurrent major depression without psychotic features (HCC) [F33.2] 06/05/2018  . PTSD (post-traumatic stress disorder) [F43.10] 11/21/2017  . MDD (major depressive disorder) [F32.9] 11/20/2017  . Major depressive disorder, recurrent episode, moderate (HCC) [F33.1] 11/05/2017  . Community acquired pneumonia of right lower lobe of lung (HCC) [J18.1] 09/02/2017  . Sepsis associated hypotension (HCC) [A41.9, I95.9] 09/02/2017  . AKI (acute kidney injury) (HCC) [N17.9] 09/02/2017  . Nausea and vomiting [R11.2] 09/02/2017  . Drug overdose [T50.901A] 04/22/2017  . Chest pain [R07.9] 10/09/2016  . Pressure injury of skin [L89.90] 10/05/2016  . Polysubstance dependence including opioid type drug with complication, episodic abuse (HCC) [F19.20]  07/21/2016  . MDD (major depressive disorder), recurrent severe, without psychosis (HCC) [F33.2] 07/21/2016  . Chronic pain [G89.29] 07/03/2015  . Diarrhea [R19.7] 05/01/2014  . Essential hypertension, benign [I10] 04/17/2014   Total Time spent with patient: 20 minutes  Past Psychiatric History: See H&P  Past Medical History:  Past Medical History:  Diagnosis Date  . Arthritis   . Depression   . Hypertension   . MRSA (methicillin resistant Staphylococcus aureus)   . Obesity   . Sleep apnea     Past Surgical History:  Procedure Laterality Date  . I&D EXTREMITY Right 04/16/2014   Procedure: IRRIGATION AND DEBRIDEMENT EXTREMITY;  Surgeon: Sharma Covert, MD;  Location: [facility] OR;  Service: Orthopedics;  Laterality: Right;   Family History:  Family History  Problem Relation Age of Onset  . Throat cancer Mother   . Hypertension Father   . Stroke Father    Family Psychiatric  History: See H&P Social History:  Social History   Substance and Sexual Activity  Alcohol Use Yes   Comment: rare, occasional     Social History   Substance and Sexual Activity  Drug Use Yes  . Types: Oxycodone   Comment: "crack" hx; Pt denied current use    Social History   Socioeconomic History  . Marital status: Single    Spouse name: Not on file  . Number of children: Not on file  . Years of education: Not on file  . Highest education level: Not on file  Occupational History  . Not on file  Social Needs  . Financial resource strain: Not on file  . Food insecurity:    Worry: Not on file    Inability: Not on file  . Transportation needs:  Medical: Not on file    Non-medical: Not on file  Tobacco Use  . Smoking status: Current Every Day Smoker    Packs/day: 1.00    Types: Cigarettes  . Smokeless tobacco: Never Used  . Tobacco comment: 1/2PPD  Substance and Sexual Activity  . Alcohol use: Yes    Comment: rare, occasional  . Drug use: Yes    Types: Oxycodone    Comment: "crack"  hx; Pt denied current use  . Sexual activity: Yes    Birth control/protection: Surgical  Lifestyle  . Physical activity:    Days per week: Not on file    Minutes per session: Not on file  . Stress: Not on file  Relationships  . Social connections:    Talks on phone: Not on file    Gets together: Not on file    Attends religious service: Not on file    Active member of club or organization: Not on file    Attends meetings of clubs or organizations: Not on file    Relationship status: Not on file  Other Topics Concern  . Not on file  Social History Narrative  . Not on file   Additional Social History:    Pain Medications: reports she wants to "get back on suboxone"  Prescriptions: reports she has been taking "oxycodone."  UDS is negative Over the Counter: denies History of alcohol / drug use?: Yes Longest period of sobriety (when/how long): unsure Negative Consequences of Use: Financial, Personal relationships, Work / School  Sleep: improving   Appetite:  Good  Current Medications: Current Facility-Administered Medications  Medication Dose Route Frequency Provider Last Rate Last Dose  . alum & mag hydroxide-simeth (MAALOX/MYLANTA) 200-200-20 MG/5ML suspension 30 mL  30 mL Oral Q4H PRN Nira Conn A, NP      . gabapentin (NEURONTIN) capsule 300 mg  300 mg Oral TID Money, Feliz Beam B, FNP   300 mg at 06/08/18 1300  . hydrOXYzine (ATARAX/VISTARIL) tablet 25 mg  25 mg Oral Q6H PRN Aylin Rhoads, Rockey Situ, MD   25 mg at 06/08/18 1300  . ibuprofen (ADVIL,MOTRIN) tablet 600 mg  600 mg Oral Q6H PRN Nira Conn A, NP   600 mg at 06/05/18 0517  . loperamide (IMODIUM) capsule 2-4 mg  2-4 mg Oral PRN Veleka Djordjevic, Rockey Situ, MD      . lurasidone (LATUDA) tablet 20 mg  20 mg Oral Q breakfast Zaheer Wageman, Rockey Situ, MD   20 mg at 06/08/18 0749  . magnesium hydroxide (MILK OF MAGNESIA) suspension 30 mL  30 mL Oral Daily PRN Nira Conn A, NP      . methocarbamol (ROBAXIN) tablet 750 mg  750 mg Oral Q6H PRN  Nira Conn A, NP   750 mg at 06/08/18 0750  . multivitamin with minerals tablet 1 tablet  1 tablet Oral Daily Haniel Fix, Rockey Situ, MD   1 tablet at 06/08/18 0748  . nicotine (NICODERM CQ - dosed in mg/24 hours) patch 21 mg  21 mg Transdermal Daily Antonieta Pert, MD   21 mg at 06/08/18 0750  . nystatin cream (MYCOSTATIN)   Topical BID Money, Gerlene Burdock, FNP      . ondansetron (ZOFRAN-ODT) disintegrating tablet 4 mg  4 mg Oral Q6H PRN Wrenn Willcox, Rockey Situ, MD      . PARoxetine (PAXIL) tablet 20 mg  20 mg Oral Daily Ariel Dimitri, Rockey Situ, MD   20 mg at 06/08/18 0748  . thiamine (VITAMIN B-1) tablet 100 mg  100  mg Oral Daily Stevi Hollinshead, Rockey Situ, MD   100 mg at 06/08/18 0748  . traZODone (DESYREL) tablet 100 mg  100 mg Oral QHS PRN Remy Dia, Rockey Situ, MD   100 mg at 06/07/18 2115    Lab Results:  No results found for this or any previous visit (from the past 48 hour(s)).  Blood Alcohol level:  Lab Results  Component Value Date   ETH <10 11/05/2017   ETH <10 06/20/2017    Metabolic Disorder Labs: Lab Results  Component Value Date   HGBA1C 5.3 06/06/2018   MPG 105.41 06/06/2018   MPG 114 04/16/2014   No results found for: PROLACTIN Lab Results  Component Value Date   CHOL 180 06/06/2018   TRIG 128 06/06/2018   HDL 49 06/06/2018   CHOLHDL 3.7 06/06/2018   VLDL 26 06/06/2018   LDLCALC 105 (H) 06/06/2018   LDLCALC 50 09/05/2017    Physical Findings: AIMS: Facial and Oral Movements Muscles of Facial Expression: None, normal Lips and Perioral Area: None, normal Jaw: None, normal Tongue: None, normal,Extremity Movements Upper (arms, wrists, hands, fingers): None, normal Lower (legs, knees, ankles, toes): None, normal, Trunk Movements Neck, shoulders, hips: None, normal, Overall Severity Severity of abnormal movements (highest score from questions above): None, normal Incapacitation due to abnormal movements: None, normal Patient's awareness of abnormal movements (rate only patient's  report): No Awareness, Dental Status Current problems with teeth and/or dentures?: No Does patient usually wear dentures?: No  CIWA:  CIWA-Ar Total: 0 COWS:  COWS Total Score: 3  Musculoskeletal: Strength & Muscle Tone: within normal limits Gait & Station: normal Patient leans: N/A  Psychiatric Specialty Exam: Physical Exam  Nursing note and vitals reviewed. Constitutional: She is oriented to person, place, and time. She appears well-developed and well-nourished.  Cardiovascular: Normal rate.  Respiratory: Effort normal.  Musculoskeletal: Normal range of motion.  Neurological: She is alert and oriented to person, place, and time.  Skin: Skin is warm.    Review of Systems  Constitutional: Negative.   HENT: Negative.   Eyes: Negative.   Respiratory: Negative.   Cardiovascular: Negative.   Gastrointestinal: Negative.   Genitourinary: Negative.        Vaginal itching  Musculoskeletal: Positive for myalgias.  Skin: Negative.   Neurological: Negative.   Endo/Heme/Allergies: Negative.   Psychiatric/Behavioral: Positive for depression. Negative for hallucinations and suicidal ideas.  no headache, no dyspnea, no vomiting, no nausea,  no fever, no chills   Blood pressure (!) 127/58, pulse 68, temperature 97.8 F (36.6 C), resp. rate 20, height 5' 2.5" (1.588 m), weight 129.3 kg.Body mass index is 51.3 kg/m.  General Appearance: improved grooming   Eye Contact:  improving   Speech:  Normal Rate  Volume:  Normal  Mood:  improved mood, describes as 8/10  Affect:  more reactive,smiles at times appropriately, reports persistent anxiety  Thought Process:  Linear and Descriptions of Associations: Intact  Orientation:  Other:  fully alert and attentive  Thought Content:  no hallucinations , no delusions   Suicidal Thoughts:  No denies current suicidal ideations , denies self injurious ideations, contracts for safety on unit  Homicidal Thoughts:  No  Memory:  recent and remote grossly  intact   Judgement:  Other:  improving   Insight:  improving   Psychomotor Activity:  Normal- no psychomotor agitation or restlessness   Concentration:  Concentration: Good and Attention Span: Good  Recall:  Good  Fund of Knowledge:  Good  Language:  Good  Akathisia:  No  Handed:  Right  AIMS (if indicated):     Assets:  Communication Skills Resilience  ADL's:  Intact  Cognition:  WNL  Sleep:  Number of Hours: 6.25   Assessment -  42 year old female, presented to the hospital due to worsening depression, anxiety, suicidal thoughts with recent thoughts of jumping out of a car.  Patient significant stressors including unemployment, homelessness, staying in an unsafe environment.  She reports limited compliance with her psychiatric medications recently, does states she has been taking Neurontin which she started about a week ago.  She recently started taking Xanax from a prior prescription and has been taking opiate analgesics (nonprescribed) irregularly (2-3 times a week)  At this time patient reports gradual improvement in mood and anxiety symptoms, denies suicidal ideations, improving neuro-vegetative symptoms , and presents future oriented, with a fuller range of affect. Thus far tolerating medications well.  Treatment Plan Summary: Daily contact with patient to assess and evaluate symptoms and progress in treatment, Medication management and Plan is to:  Treatment Plan reviewed as below today 10/18. Encourage group and milieu participation to work on Pharmacologist and symptom reduction Continue  Neurontin  300 mg p.o. 3 times daily for anxiety, pain Continue Vistaril 25 mg p.o. every 6 hours as needed for anxiety or agitation Continue  Latuda 20 mg p.o. daily for mood disorder, augmentation Continue   Paxil  20 mg p.o. daily for depression, anxiety  Continue Trazodone 100 mg at nighttime  as needed for insomnia   Craige Cotta, MD 06/08/2018, 2:44 PM   Patient ID: [name], female   DOB: 09-Apr-1976, 42 y.o.   MRN: [id number]

## 2018-06-08 NOTE — Progress Notes (Signed)
Adult Psychoeducational Group Note  Date:  06/08/2018 Time:  1:22 AM  Group Topic/Focus:  Wrap-Up Group:   The focus of this group is to help patients review their daily goal of treatment and discuss progress on daily workbooks.  Participation Level:  Active  Participation Quality:  Appropriate  Affect:  Appropriate  Cognitive:  Appropriate  Insight: Appropriate  Engagement in Group:  Engaged  Modes of Intervention:  Discussion  Additional Comments:  Pt's goal was to get out of bed, eat some food and come to all groups.  Pt stated her goals were met.  Pt rated the day at a 6/10.  Brexlee Heberlein 06/08/2018, 1:22 AM

## 2018-06-08 NOTE — Progress Notes (Signed)
CSW met with pt to discuss discharge options.  Pt said she has spoken to her friend in [place] and has decided to return and stay with her, rather than pursue any of the shelter options.  Pt agreeable to be referred to [facility] for follow up services. Winferd Humphrey, MSW, LCSW Clinical Social Worker 06/08/2018 2:53 PM

## 2018-06-09 DIAGNOSIS — F3181 Bipolar II disorder: Secondary | ICD-10-CM

## 2018-06-09 MED ORDER — LURASIDONE HCL 20 MG PO TABS: 20.0000 mg | ORAL_TABLET | Freq: Every day | ORAL | 0 refills | Status: DC

## 2018-06-09 MED ORDER — TRAZODONE HCL 100 MG PO TABS: 100.0000 mg | ORAL_TABLET | Freq: Every evening | ORAL | 0 refills | Status: DC | PRN

## 2018-06-09 MED ORDER — NYSTATIN 100000 UNIT/GM EX CREA: TOPICAL_CREAM | Freq: Two times a day (BID) | CUTANEOUS | 0 refills | Status: DC

## 2018-06-09 MED ORDER — PAROXETINE HCL 20 MG PO TABS: 20.0000 mg | ORAL_TABLET | Freq: Every day | ORAL | 0 refills | Status: DC

## 2018-06-09 MED ORDER — GABAPENTIN 300 MG PO CAPS: 300.0000 mg | ORAL_CAPSULE | Freq: Three times a day (TID) | ORAL | 0 refills | Status: DC

## 2018-06-09 NOTE — Progress Notes (Signed)
D: Pt A & O X 4. Presents animated and talkative on initial contact "I'm going home today, I'm ready too, I can't wait to go smoke; that's the first thing I'll do". Denies SI, HI, AVH and pain at this time. D/C home as ordered. Bus passes given to pt ([employer]) at time of departure. A: D/C instructions reviewed with pt including prescriptions, medication samples and follow up appointment; compliance encouraged. All belongings from locker #[id number] given to pt at time of departure. Scheduled medications given with verbal education and effects monitored. Safety checks maintained without incident till time of d/c.  R: Pt receptive to care. Compliant with medications when offered. Denies adverse drug reactions when assessed. Verbalized understanding related to d/c instructions. Signed belonging sheet in agreement with items received from locker. Ambulatory with a steady gait. Appears to be in no physical distress at time of departure.
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
## 2018-06-09 NOTE — [facility] Suicide Risk Assessment (Signed)
[facility] Discharge Suicide Risk Assessment   Principal Problem: Severe recurrent major depression without psychotic features [facility]) Discharge Diagnoses:  Patient Active Problem List   Diagnosis Date Noted  . Severe recurrent major depression without psychotic features (HCC) [F33.2] 06/05/2018  . PTSD (post-traumatic stress disorder) [F43.10] 11/21/2017  . MDD (major depressive disorder) [F32.9] 11/20/2017  . Major depressive disorder, recurrent episode, moderate (HCC) [F33.1] 11/05/2017  . Community acquired pneumonia of right lower lobe of lung (HCC) [J18.1] 09/02/2017  . Sepsis associated hypotension (HCC) [A41.9, I95.9] 09/02/2017  . AKI (acute kidney injury) (HCC) [N17.9] 09/02/2017  . Nausea and vomiting [R11.2] 09/02/2017  . Drug overdose [T50.901A] 04/22/2017  . Chest pain [R07.9] 10/09/2016  . Pressure injury of skin [L89.90] 10/05/2016  . Polysubstance dependence including opioid type drug with complication, episodic abuse (HCC) [F19.20] 07/21/2016  . MDD (major depressive disorder), recurrent severe, without psychosis (HCC) [F33.2] 07/21/2016  . Chronic pain [G89.29] 07/03/2015  . Diarrhea [R19.7] 05/01/2014  . Essential hypertension, benign [I10] 04/17/2014    Total Time spent with patient: 30 minutes  Musculoskeletal: Strength & Muscle Tone: within normal limits Gait & Station: normal Patient leans: N/A  Psychiatric Specialty Exam: ROS denies headache, no chest pain, no shortness of breath, no vomiting  Blood pressure (!) 127/58, pulse 68, temperature 97.8 F (36.6 C), resp. rate 20, height 5' 2.5" (1.588 m), weight 129.3 kg.Body mass index is 51.3 kg/m.  General Appearance: improving grooming   Eye Contact::  Good  Speech:  Normal Rate409  Volume:  Normal  Mood:  " a lot better", reports mood as 7/10 with 10 being best   Affect:  Appropriate and more reactive   Thought Process:  Linear and Descriptions of Associations: Intact  Orientation:  Full (Time, Place, and  Person)  Thought Content:  no hallucinations, no delusions, not internally preoccupied   Suicidal Thoughts:  No denies suicidal or self injurious ideations, denies homicidal or violent ideations  Homicidal Thoughts:  No  Memory:  recent and remote grossly intact   Judgement:  Other:  improving   Insight:  improving   Psychomotor Activity:  Normal  Concentration:  Good  Recall:  Good  Fund of Knowledge:Good  Language: Good  Akathisia:  Negative  Handed:  Right  AIMS (if indicated):     Assets:  Desire for Improvement Resilience  Sleep:  Number of Hours: 6.75  Cognition: WNL  ADL's:  Intact   Mental Status Per Nursing Assessment::   On Admission:  NA  Demographic Factors:  42, homeless, currently staying with friends, has two children who are with their father, unemployment  Loss Factors: Homelessness, unemployment, stressful environment at the place she had been staying  Historical Factors: History of depression, history of prior psychiatric admissions , reports history of prior diagnosis of Bipolar Disorder and PTSD, history of opiate dependence   Risk Reduction Factors:   Positive coping skills or problem solving skills  Continued Clinical Symptoms:  At this time patient is alert, attentive, reports improved mood and states she is feeling a lot better than on admission, affect is more reactive, no thought disorder, no SI or HI, no psychotic symptoms, future oriented . Denies medication side effects Behavior on unit in good control, pleasant on approach  Cognitive Features That Contribute To Risk:  No gross cognitive deficits noted upon discharge. Is alert , attentive, and oriented x 3     Suicide Risk:  Mild:  Suicidal ideation of limited frequency, intensity, duration, and specificity.  There are no identifiable plans, no associated intent, mild dysphoria and related symptoms, good self-control (both objective and subjective assessment), few other risk factors, and  identifiable protective factors, including available and accessible social support.  Follow-up Information    [facility] Follow up on 06/13/2018.   Why:  Please attend your intake appt on Wednesday, 06/13/18, at 12:15pm.  Please bring photo ID, social security card, and proof of household income (pay stub). Contact information: [address] [place] [address] [phone]           Plan Of Care/Follow-up recommendations:  Activity:  as tolerated Diet:  heart healthy Tests:  NA Other:  See below  Patient is expressing readiness for discharge and is leaving unit in good spirits Plans to live with friends Plans to follow up as above She reports she  has a PCP at [facility] in [place] , for medical management and follow up as needed    Craige Cotta, MD 06/09/2018, 7:42 AM
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
## 2018-06-09 NOTE — Progress Notes (Signed)
  [facility] Adult Case Management Discharge Plan :  Will you be returning to the same living situation after discharge:  No.  Is going to stay with a friend At discharge, do you have transportation home?: Yes,  arranged by patient Do you have the ability to pay for your medications: No.  MCD but limited income  Release of information consent forms completed and turned in to Medical Records by CSW.   Patient to Follow up at: Follow-up Information    [facility] Follow up on 06/13/2018.   Why:  Please attend your intake appt on Wednesday, 06/13/18, at 12:15pm.  Please bring photo ID, social security card, and proof of household income (pay stub). Contact information: [address] [place] [address] [phone]           Next level of care provider has access to [facility] Link:no  Safety Planning and Suicide Prevention discussed: No.  Discussed only with patient, declined with other  Have you used any form of tobacco in the last 30 days? (Cigarettes, Smokeless Tobacco, Cigars, and/or Pipes): Yes  Has patient been referred to the Quitline?: Patient refused referral  Patient has been referred for addiction treatment: N/A  Lynnell Chad, LCSW 06/09/2018, 11:27 AM

## 2018-07-07 ENCOUNTER — Other Ambulatory Visit: Payer: Self-pay

## 2018-07-07 ENCOUNTER — Inpatient Hospital Stay (HOSPITAL_COMMUNITY)
Admission: AD | Admit: 2018-07-07 | Discharge: 2018-07-12 | DRG: 885 | Disposition: A | Discharge status: Home / Self Care | Payer: Medicaid Other | Source: Intra-hospital | Attending: Psychiatry | Admitting: Psychiatry

## 2018-07-07 ENCOUNTER — Encounter (HOSPITAL_COMMUNITY): Payer: Self-pay

## 2018-07-07 DIAGNOSIS — Z560 Unemployment, unspecified: Secondary | ICD-10-CM | POA: Diagnosis not present

## 2018-07-07 DIAGNOSIS — Z79899 Other long term (current) drug therapy: Secondary | ICD-10-CM | POA: Diagnosis not present

## 2018-07-07 DIAGNOSIS — I10 Essential (primary) hypertension: Secondary | ICD-10-CM | POA: Diagnosis present

## 2018-07-07 DIAGNOSIS — Z823 Family history of stroke: Secondary | ICD-10-CM

## 2018-07-07 DIAGNOSIS — Z62810 Personal history of physical and sexual abuse in childhood: Secondary | ICD-10-CM | POA: Diagnosis present

## 2018-07-07 DIAGNOSIS — F1320 Sedative, hypnotic or anxiolytic dependence, uncomplicated: Secondary | ICD-10-CM

## 2018-07-07 DIAGNOSIS — Z808 Family history of malignant neoplasm of other organs or systems: Secondary | ICD-10-CM | POA: Diagnosis not present

## 2018-07-07 DIAGNOSIS — Z8249 Family history of ischemic heart disease and other diseases of the circulatory system: Secondary | ICD-10-CM | POA: Diagnosis not present

## 2018-07-07 DIAGNOSIS — E7800 Pure hypercholesterolemia, unspecified: Secondary | ICD-10-CM | POA: Diagnosis present

## 2018-07-07 DIAGNOSIS — G4730 Sleep apnea, unspecified: Secondary | ICD-10-CM | POA: Diagnosis present

## 2018-07-07 DIAGNOSIS — F4310 Post-traumatic stress disorder, unspecified: Secondary | ICD-10-CM | POA: Diagnosis present

## 2018-07-07 DIAGNOSIS — F1490 Cocaine use, unspecified, uncomplicated: Secondary | ICD-10-CM | POA: Diagnosis present

## 2018-07-07 DIAGNOSIS — F419 Anxiety disorder, unspecified: Secondary | ICD-10-CM

## 2018-07-07 DIAGNOSIS — Z818 Family history of other mental and behavioral disorders: Secondary | ICD-10-CM | POA: Diagnosis not present

## 2018-07-07 DIAGNOSIS — F17210 Nicotine dependence, cigarettes, uncomplicated: Secondary | ICD-10-CM | POA: Diagnosis present

## 2018-07-07 DIAGNOSIS — F410 Panic disorder [episodic paroxysmal anxiety] without agoraphobia: Secondary | ICD-10-CM | POA: Diagnosis present

## 2018-07-07 DIAGNOSIS — J449 Chronic obstructive pulmonary disease, unspecified: Secondary | ICD-10-CM | POA: Diagnosis present

## 2018-07-07 DIAGNOSIS — R45851 Suicidal ideations: Secondary | ICD-10-CM | POA: Diagnosis present

## 2018-07-07 DIAGNOSIS — E669 Obesity, unspecified: Secondary | ICD-10-CM | POA: Diagnosis present

## 2018-07-07 DIAGNOSIS — Z6841 Body Mass Index (BMI) 40.0 and over, adult: Secondary | ICD-10-CM

## 2018-07-07 DIAGNOSIS — G4700 Insomnia, unspecified: Secondary | ICD-10-CM | POA: Diagnosis present

## 2018-07-07 DIAGNOSIS — Z888 Allergy status to other drugs, medicaments and biological substances status: Secondary | ICD-10-CM

## 2018-07-07 DIAGNOSIS — F332 Major depressive disorder, recurrent severe without psychotic features: Secondary | ICD-10-CM | POA: Diagnosis present

## 2018-07-07 DIAGNOSIS — F1310 Sedative, hypnotic or anxiolytic abuse, uncomplicated: Secondary | ICD-10-CM | POA: Diagnosis present

## 2018-07-07 DIAGNOSIS — F13239 Sedative, hypnotic or anxiolytic dependence with withdrawal, unspecified: Secondary | ICD-10-CM

## 2018-07-07 DIAGNOSIS — F329 Major depressive disorder, single episode, unspecified: Secondary | ICD-10-CM | POA: Diagnosis present

## 2018-07-07 DIAGNOSIS — Z8614 Personal history of Methicillin resistant Staphylococcus aureus infection: Secondary | ICD-10-CM | POA: Diagnosis not present

## 2018-07-07 DIAGNOSIS — F339 Major depressive disorder, recurrent, unspecified: Secondary | ICD-10-CM | POA: Diagnosis not present

## 2018-07-07 HISTORY — DX: Bipolar disorder, unspecified: F31.9

## 2018-07-07 HISTORY — DX: Pure hypercholesterolemia, unspecified: E78.00

## 2018-07-07 HISTORY — DX: Disorder of kidney and ureter, unspecified: N28.9

## 2018-07-07 MED ORDER — ADULT MULTIVITAMIN W/MINERALS CH
1.0000 | ORAL_TABLET | Freq: Every day | ORAL | Status: DC
  Administered 2018-07-07 – 2018-07-12 (×6): 1 via ORAL
  Filled 2018-07-07 (×9): qty 1

## 2018-07-07 MED ORDER — LOPERAMIDE HCL 2 MG PO CAPS
2.0000 mg | ORAL_CAPSULE | ORAL | Status: AC | PRN
  Administered 2018-07-09: 2 mg via ORAL
  Filled 2018-07-07: qty 1

## 2018-07-07 MED ORDER — VENLAFAXINE HCL ER 37.5 MG PO CP24
37.5000 mg | ORAL_CAPSULE | Freq: Every day | ORAL | Status: DC
  Administered 2018-07-08 – 2018-07-09 (×2): 37.5 mg via ORAL
  Filled 2018-07-07 (×3): qty 1

## 2018-07-07 MED ORDER — LURASIDONE HCL 20 MG PO TABS
20.0000 mg | ORAL_TABLET | Freq: Every day | ORAL | Status: DC
  Filled 2018-07-07: qty 1

## 2018-07-07 MED ORDER — THIAMINE HCL 100 MG/ML IJ SOLN: 100.0000 mg | Freq: Once | INTRAMUSCULAR | Status: DC

## 2018-07-07 MED ORDER — LURASIDONE HCL 20 MG PO TABS
20.0000 mg | ORAL_TABLET | Freq: Every day | ORAL | Status: DC
Start: 2018-07-08 — End: 2018-07-07

## 2018-07-07 MED ORDER — NICOTINE 21 MG/24HR TD PT24
21.0000 mg | MEDICATED_PATCH | TRANSDERMAL | Status: DC
  Administered 2018-07-07 – 2018-07-11 (×5): 21 mg via TRANSDERMAL
  Filled 2018-07-07 (×8): qty 1

## 2018-07-07 MED ORDER — LORAZEPAM 1 MG PO TABS
1.0000 mg | ORAL_TABLET | Freq: Four times a day (QID) | ORAL | Status: AC | PRN
  Administered 2018-07-07 – 2018-07-10 (×9): 1 mg via ORAL
  Filled 2018-07-07 (×9): qty 1

## 2018-07-07 MED ORDER — GABAPENTIN 300 MG PO CAPS
300.0000 mg | ORAL_CAPSULE | Freq: Three times a day (TID) | ORAL | Status: DC
  Administered 2018-07-07: 300 mg via ORAL
  Filled 2018-07-07 (×2): qty 1

## 2018-07-07 MED ORDER — ONDANSETRON 4 MG PO TBDP: 4.0000 mg | ORAL_TABLET | Freq: Four times a day (QID) | ORAL | Status: AC | PRN

## 2018-07-07 MED ORDER — TRAZODONE HCL 100 MG PO TABS: 100.0000 mg | ORAL_TABLET | Freq: Every evening | ORAL | Status: DC | PRN

## 2018-07-07 MED ORDER — PAROXETINE HCL 20 MG PO TABS
20.0000 mg | ORAL_TABLET | Freq: Every day | ORAL | Status: DC
  Filled 2018-07-07 (×2): qty 1

## 2018-07-07 MED ORDER — ALUM & MAG HYDROXIDE-SIMETH 200-200-20 MG/5ML PO SUSP: 30.0000 mL | ORAL | Status: DC | PRN

## 2018-07-07 MED ORDER — GABAPENTIN 100 MG PO CAPS
200.0000 mg | ORAL_CAPSULE | Freq: Three times a day (TID) | ORAL | Status: DC
  Administered 2018-07-07: 200 mg via ORAL
  Filled 2018-07-07 (×3): qty 2

## 2018-07-07 MED ORDER — TRAZODONE HCL 50 MG PO TABS
50.0000 mg | ORAL_TABLET | Freq: Every evening | ORAL | Status: DC | PRN
  Administered 2018-07-07 – 2018-07-11 (×4): 50 mg via ORAL
  Filled 2018-07-07 (×4): qty 1

## 2018-07-07 MED ORDER — MAGNESIUM HYDROXIDE 400 MG/5ML PO SUSP: 30.0000 mL | Freq: Every day | ORAL | Status: DC | PRN

## 2018-07-07 MED ORDER — HYDROXYZINE HCL 25 MG PO TABS
25.0000 mg | ORAL_TABLET | Freq: Four times a day (QID) | ORAL | Status: AC | PRN
  Administered 2018-07-07 – 2018-07-09 (×2): 25 mg via ORAL
  Filled 2018-07-07 (×2): qty 1

## 2018-07-07 MED ORDER — GABAPENTIN 300 MG PO CAPS
300.0000 mg | ORAL_CAPSULE | Freq: Three times a day (TID) | ORAL | Status: DC
  Administered 2018-07-07 – 2018-07-10 (×8): 300 mg via ORAL
  Filled 2018-07-07 (×13): qty 1

## 2018-07-07 MED ORDER — LURASIDONE HCL 20 MG PO TABS
20.0000 mg | ORAL_TABLET | Freq: Every day | ORAL | Status: DC
  Administered 2018-07-07 – 2018-07-11 (×5): 20 mg via ORAL
  Filled 2018-07-07 (×3): qty 1
  Filled 2018-07-07: qty 7
  Filled 2018-07-07 (×3): qty 1

## 2018-07-07 MED ORDER — VITAMIN B-1 100 MG PO TABS
100.0000 mg | ORAL_TABLET | Freq: Every day | ORAL | Status: DC
  Administered 2018-07-08 – 2018-07-12 (×5): 100 mg via ORAL
  Filled 2018-07-07 (×7): qty 1

## 2018-07-07 NOTE — Progress Notes (Signed)
D: Patient arrived to Adult [facility] today as a voluntary patient. She has a history of bipolar disorder, and is currently depressed. She states she has never felt this depressed before. She has several stressors: she recently found that her brother is diagnosed with cancer, she is homeless, and she has no employment. She came into the ED suicidal with a plan to walk into traffic. She also reports using cocaine four days ago "as a one time thing." She had a prior admission and has used cocaine in the past, however. She denies alcohol use. UDS +cocaine, alcohol negative. She has a history of HTN, hypercholesterolemia, sleep apnea, renal disease, arthritis, anemia, and possibly diabetes. Patient demanded multiple times to have RN escort her off the campus so she could smoke a cigarette to relieve her anxiety. I reinforced that this was not a possibility, and offered nicotine replacement. A: Patient had no belongings at time of admission. Unit orientation completed. Care plan and unit routines reviewed with patient, understanding verbalized. Emotional support offered to patient. Encouraged patient to voice concerns. Fluids offered to patient. Q15 minute checks initiated for safety on and off unit.  R: Patient resting calmly in room.

## 2018-07-07 NOTE — [facility] Group Notes (Signed)
[facility] Group Notes: (Clinical Social Work)   07/07/2018      Type of Therapy:  Group Therapy   Participation Level:  Did Not Attend despite MHT prompting   Ambrose Mantle, LCSW 07/07/2018, 12:29 PM
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
## 2018-07-07 NOTE — Progress Notes (Signed)
D.  Pt pleasant on approach, no complaints voiced.  Pt is a new admission to unit today and did not feel well enough to get up for evening AA group.  Pt denies SI/HI/AVH at this time.  Minimal interaction on unit.  A.  Support and encouragement offered, medication given as ordered  R.  Pt remains safe on the unit, will continue to monitor.
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
## 2018-07-07 NOTE — Tx Team (Signed)
Initial Treatment Plan 07/07/2018 4:34 PM Judy Robles ZOX:[id number]    PATIENT STRESSORS: Financial difficulties Substance abuse Other: homelessness, and brother diagnosed with cancer   PATIENT STRENGTHS: Ability for insight General fund of knowledge Motivation for treatment/growth   PATIENT IDENTIFIED PROBLEMS: 1. "self-esteem"  2. "Motivation" for treatment                   DISCHARGE CRITERIA:  Ability to meet basic life and health needs Adequate post-discharge living arrangements Improved stabilization in mood, thinking, and/or behavior  PRELIMINARY DISCHARGE PLAN: Attend 12-step recovery group Outpatient therapy  PATIENT/FAMILY INVOLVEMENT: This treatment plan has been presented to and reviewed with the patient, Judy Robles.  The patient has been given the opportunity to ask questions and make suggestions.  Kirstie Mirza, RN 07/07/2018, 4:34 PM
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
## 2018-07-08 NOTE — Progress Notes (Signed)
Patient did not attend the evening speaker AA meeting. Pt was notified that group was beginning but remained in bed.   
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
## 2018-07-08 NOTE — Progress Notes (Signed)
D. Pt presents with an anxious affect/depressed mood- calm and cooperative behavior- somewhat isolative to room for much of the shift- although reports an improving mood.. Pt currently denies SI/HI and AVH and agrees to contact staff before acting on any harmful thoughts.  A. Labs and vitals monitored. Pt compliant with medications. Pt supported emotionally and encouraged to express concerns and ask questions.   R. Pt remains safe with 15 minute checks. Will continue POC.

## 2018-07-08 NOTE — Progress Notes (Signed)
D.  Pt has been in bed resting with eyes closed, respirations even and unlabored.  Pt did not get up to attend evening AA group.  Pt denies SI/HI/AVH at this time.  A.  Support and encouragement offered  R.  Pt remains safe on the unit, will continue to monitor.

## 2018-07-08 NOTE — [facility] Group Notes (Signed)
[facility] Group Notes:  (Nursing/MHT/Case Management/Adjunct)  Date:  07/08/2018  Time:  4:48 PM  Type of Therapy:  Nurse Education  Participation Level:  Did Not Attend  Summary of Progress/Problems: This group focused on Jillyn Hidden Chapman's 5 love languages. We discussed definitions of each of the different love languages. We also covered healthy boundaries in our relationships. The group was given homework to take a quiz and find out what their love language is and apply that to their relationships.  Kirstie Mirza 07/08/2018, 4:48 PM

## 2018-07-08 NOTE — [facility] Group Notes (Signed)
[facility] LCSW Group Therapy Note  07/08/2018  9:00-10:00AM  Type of Therapy and Topic:  Group Therapy:  Adding Supports Including Being Your Own Support  Participation Level:  Did Not Attend   Description of Group:  Patients in this group were introduced to the concept that additional supports including self-support are an essential part of recovery.  A song entitled "I Need Help!" was played and a group discussion was held in reaction to the idea of needing to add supports.  A song entitled "My Own Hero" was played and a group discussion ensued in which patients stated they could relate to the song and it inspired them to realize they have be willing to help themselves in order to succeed, because other people cannot achieve sobriety or stability for them.  We discussed adding a variety of healthy supports to address the various needs in their lives.   Therapeutic Goals: 1)  demonstrate the importance of being a part of one's own support system 2)  discuss reasons people in one's life may eventually be unable to be continually supportive  3)  identify the patient's current support system and   4)  elicit commitments to add healthy supports and to become more conscious of being self-supportive   Summary of Patient Progress:  N/A   Therapeutic Modalities:   Motivational Interviewing Activity  Lynnell Chad

## 2018-07-09 DIAGNOSIS — F332 Major depressive disorder, recurrent severe without psychotic features: Principal | ICD-10-CM

## 2018-07-09 DIAGNOSIS — F1320 Sedative, hypnotic or anxiolytic dependence, uncomplicated: Secondary | ICD-10-CM

## 2018-07-09 MED ORDER — VENLAFAXINE HCL ER 75 MG PO CP24
75.0000 mg | ORAL_CAPSULE | Freq: Every day | ORAL | Status: DC
  Administered 2018-07-10: 75 mg via ORAL
  Filled 2018-07-09 (×2): qty 1

## 2018-07-09 NOTE — Tx Team (Signed)
Interdisciplinary Treatment and Diagnostic Plan Update  07/09/2018 Time of Session:  Judy Robles MRN: [id number]  Principal Diagnosis: <principal problem not specified>  Secondary Diagnoses: Active Problems:   MDD (major depressive disorder)   Current Medications:  Current Facility-Administered Medications  Medication Dose Route Frequency Provider Last Rate Last Dose  . alum & mag hydroxide-simeth (MAALOX/MYLANTA) 200-200-20 MG/5ML suspension 30 mL  30 mL Oral Q4H PRN Derrill Center, NP      . gabapentin (NEURONTIN) capsule 300 mg  300 mg Oral TID Johnn Hai, MD   300 mg at 07/09/18 1214  . hydrOXYzine (ATARAX/VISTARIL) tablet 25 mg  25 mg Oral Q6H PRN Cobos, Myer Peer, MD   25 mg at 07/07/18 2231  . loperamide (IMODIUM) capsule 2-4 mg  2-4 mg Oral PRN Cobos, Myer Peer, MD      . LORazepam (ATIVAN) tablet 1 mg  1 mg Oral Q6H PRN Cobos, Myer Peer, MD   1 mg at 07/09/18 0831  . lurasidone (LATUDA) tablet 20 mg  20 mg Oral Q supper Johnn Hai, MD   20 mg at 07/08/18 1641  . magnesium hydroxide (MILK OF MAGNESIA) suspension 30 mL  30 mL Oral Daily PRN Derrill Center, NP      . multivitamin with minerals tablet 1 tablet  1 tablet Oral Daily Cobos, Myer Peer, MD   1 tablet at 07/09/18 0828  . nicotine (NICODERM CQ - dosed in mg/24 hours) patch 21 mg  21 mg Transdermal Q24H Cobos, Myer Peer, MD   21 mg at 07/08/18 1645  . ondansetron (ZOFRAN-ODT) disintegrating tablet 4 mg  4 mg Oral Q6H PRN Cobos, Fernando A, MD      . thiamine (VITAMIN B-1) tablet 100 mg  100 mg Oral Daily Cobos, Myer Peer, MD   100 mg at 07/09/18 0828  . traZODone (DESYREL) tablet 50 mg  50 mg Oral QHS PRN Cobos, Myer Peer, MD   50 mg at 07/07/18 2230  . [START ON 07/10/2018] venlafaxine XR (EFFEXOR-XR) 24 hr capsule 75 mg  75 mg Oral Q breakfast [name], [name], MD       PTA Medications: Medications Prior to Admission  Medication Sig Dispense Refill Last Dose  . gabapentin (NEURONTIN) 300 MG  capsule Take 1 capsule (300 mg total) by mouth 3 (three) times daily. 90 capsule 0   . lurasidone (LATUDA) 20 MG TABS tablet Take 1 tablet (20 mg total) by mouth daily with breakfast. For mood control 30 tablet 0   . nystatin cream (MYCOSTATIN) Apply topically 2 (two) times daily. 30 g 0   . PARoxetine (PAXIL) 20 MG tablet Take 1 tablet (20 mg total) by mouth daily. 30 tablet 0   . traZODone (DESYREL) 100 MG tablet Take 1 tablet (100 mg total) by mouth at bedtime as needed for sleep. 30 tablet 0     Patient Stressors: Financial difficulties Substance abuse Other: homelessness, and brother diagnosed with cancer  Patient Strengths: Ability for insight General fund of knowledge Motivation for treatment/growth  Treatment Modalities: Medication Management, Group therapy, Case management,  1 to 1 session with clinician, Psychoeducation, Recreational therapy.   Physician Treatment Plan for Primary Diagnosis: <principal problem not specified> Long Term Goal(s): Improvement in symptoms so as ready for discharge Improvement in symptoms so as ready for discharge   Short Term Goals: Ability to identify changes in lifestyle to reduce recurrence of condition will improve Ability to maintain clinical measurements within normal limits will improve Ability to  identify changes in lifestyle to reduce recurrence of condition will improve Ability to identify triggers associated with substance abuse/mental health issues will improve  Medication Management: Evaluate patient's response, side effects, and tolerance of medication regimen.  Therapeutic Interventions: 1 to 1 sessions, Unit Group sessions and Medication administration.  Evaluation of Outcomes: Not Met  Physician Treatment Plan for Secondary Diagnosis: Active Problems:   MDD (major depressive disorder)  Long Term Goal(s): Improvement in symptoms so as ready for discharge Improvement in symptoms so as ready for discharge   Short Term Goals:  Ability to identify changes in lifestyle to reduce recurrence of condition will improve Ability to maintain clinical measurements within normal limits will improve Ability to identify changes in lifestyle to reduce recurrence of condition will improve Ability to identify triggers associated with substance abuse/mental health issues will improve     Medication Management: Evaluate patient's response, side effects, and tolerance of medication regimen.  Therapeutic Interventions: 1 to 1 sessions, Unit Group sessions and Medication administration.  Evaluation of Outcomes: Not Met   RN Treatment Plan for Primary Diagnosis: <principal problem not specified> Long Term Goal(s): Knowledge of disease and therapeutic regimen to maintain health will improve  Short Term Goals: Ability to participate in decision making will improve, Ability to verbalize feelings will improve, Ability to disclose and discuss suicidal ideas, Ability to identify and develop effective coping behaviors will improve and Compliance with prescribed medications will improve  Medication Management: RN will administer medications as ordered by provider, will assess and evaluate patient's response and provide education to patient for prescribed medication. RN will report any adverse and/or side effects to prescribing provider.  Therapeutic Interventions: 1 on 1 counseling sessions, Psychoeducation, Medication administration, Evaluate responses to treatment, Monitor vital signs and CBGs as ordered, Perform/monitor CIWA, COWS, AIMS and Fall Risk screenings as ordered, Perform wound care treatments as ordered.  Evaluation of Outcomes: Not Met   LCSW Treatment Plan for Primary Diagnosis: <principal problem not specified> Long Term Goal(s): Safe transition to appropriate next level of care at discharge, Engage patient in therapeutic group addressing interpersonal concerns.  Short Term Goals: Engage patient in aftercare planning with  referrals and resources  Therapeutic Interventions: Assess for all discharge needs, 1 to 1 time with Social worker, Explore available resources and support systems, Assess for adequacy in community support network, Educate family and significant other(s) on suicide prevention, Complete Psychosocial Assessment, Interpersonal group therapy.  Evaluation of Outcomes: Not Met   Progress in Treatment: Attending groups: No. Participating in groups: No. Taking medication as prescribed: Yes. Toleration medication: Yes. Family/Significant other contact made: No, will contact:  the patient's mother Patient understands diagnosis: Yes. Discussing patient identified problems/goals with staff: Yes. Medical problems stabilized or resolved: Yes. Denies suicidal/homicidal ideation: Yes. Issues/concerns per patient self-inventory: No. Other:   New problem(s) identified: None  New Short Term/Long Term Goal(s):Detox, medication stabilization, elimination of SI thoughts, development of comprehensive mental wellness plan.    Patient Goals:  Motivations for treatment    Discharge Plan or Barriers: Patient plans to discharge home with her mother and follow up with [facility] for outpatient medication management and therapy services.   Reason for Continuation of Hospitalization: Anxiety Depression Medication stabilization Suicidal ideation  Estimated Length of Stay: 3-5 days   Attendees: Patient: 07/09/2018 1:25 PM  Physician: Dr. Maris Berger, MD 07/09/2018 1:25 PM  Nursing: Rise Paganini.Raliegh Ip, RN 07/09/2018 1:25 PM  RN Care Manager: Lars Pinks, RN 07/09/2018 1:25 PM  Social Worker: Radonna Ricker, LCSWA  07/09/2018 1:25 PM  Recreational Therapist:  07/09/2018 1:25 PM  Other:  07/09/2018 1:25 PM  Other:  07/09/2018 1:25 PM  Other: 07/09/2018 1:25 PM    Scribe for Treatment Team: Marylee Floras, [place] 07/09/2018 1:25 PM

## 2018-07-09 NOTE — Progress Notes (Signed)
D:  Patient's self inventory sheet, patient has fair sleep, no sleep medication given.  Fair appetite, low energy level, poor concentration.  Rated depression and anxiety 8, hopeless 6.          Withdrawals, diarrhea, cramping.  Denied SI.  Denied physical problems.  Denied physical pain.  Goal is go to groups.  Plans to stay awake.  No discharge plans. A:   Medications administered per MD orders.  Emotional support and encouragement given patient. R:  Denied SI and HI, contracts for safety.  Denied A/V hallucinations.  Safety maintained with 15 minute checks.

## 2018-07-09 NOTE — Plan of Care (Signed)
Nurse discussed anxiety, depression and coping skills with patient.  

## 2018-07-09 NOTE — Progress Notes (Signed)
D  Pt endorses depression and anxiety and moderate withdrawal symptoms including irritability, diarrhea , anxiety and GI upset   She has body odor and is discheveled  A    Verbal support given    Medications administered and effectiveness monitored   Q 15 min checks R   Pt safe at this time
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
## 2018-07-09 NOTE — Progress Notes (Signed)
Recreation Therapy Notes  Date: 11.18.19 Time: 0930 Location: [address] Dayroom  Group Topic: Stress Management  Goal Area(s) Addresses:  Patient will verbalize importance of using healthy stress management.  Patient will identify positive emotions associated with healthy stress management.   Intervention: Stress Management  Activity :  Guided Imagery.  LRT introduced the stress management technique of guided imagery.  LRT read a script that took patients on a journey through the forest.  Patients were to listen as the script was read to engage in the activity.  Education:  Stress Management, Discharge Planning.   Education Outcome: Acknowledges edcuation/In group clarification offered/Needs additional education  Clinical Observations/Feedback: Pt did not attend group.     Judy Robles,  LRT/CTRS         Kauri Garson A 07/09/2018 11:24 AM 

## 2018-07-10 DIAGNOSIS — F1320 Sedative, hypnotic or anxiolytic dependence, uncomplicated: Secondary | ICD-10-CM

## 2018-07-10 MED ORDER — VENLAFAXINE HCL ER 75 MG PO CP24
75.0000 mg | ORAL_CAPSULE | Freq: Every day | ORAL | Status: DC
  Administered 2018-07-11: 75 mg via ORAL
  Filled 2018-07-10: qty 7
  Filled 2018-07-10 (×2): qty 1

## 2018-07-10 MED ORDER — GABAPENTIN 400 MG PO CAPS
400.0000 mg | ORAL_CAPSULE | Freq: Three times a day (TID) | ORAL | Status: DC
  Administered 2018-07-10 – 2018-07-12 (×7): 400 mg via ORAL
  Filled 2018-07-10 (×12): qty 1

## 2018-07-10 MED ORDER — HYDROXYZINE HCL 25 MG PO TABS
25.0000 mg | ORAL_TABLET | Freq: Four times a day (QID) | ORAL | Status: DC | PRN
  Administered 2018-07-10 – 2018-07-11 (×2): 25 mg via ORAL
  Filled 2018-07-10 (×2): qty 1
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
## 2018-07-10 NOTE — [facility] Group Notes (Signed)
[facility] Group Notes:  (Nursing/MHT/Case Management/Adjunct)  Date:  07/10/2018  Time:  4:00 pm  Type of Therapy:  Psychoeducational Skills  Participation Level:  Active  Participation Quality:  Appropriate  Affect:  Appropriate  Cognitive:  Appropriate  Insight:  Appropriate  Engagement in Group:  Engaged  Modes of Intervention:  Discussion and Education  Summary of Progress/Problems:  Patient was alert and active during group.   Judy Robles 07/10/2018, 5:36 PM

## 2018-07-10 NOTE — Plan of Care (Signed)
Nurse discussed anxiety, depression, coping skills with patient. 

## 2018-07-10 NOTE — Progress Notes (Signed)
D:  Patient's self inventory sheet, patient has fair sleep, sleep medication helpful.  Fair appetite, low energy level, poor concentration.  Rated depression 6, hopeless 7, anxiety 8.  Denied withdrawals.  Denied SI.  Denied physical problems.  Denied physical pain.  Goal is stay awake.  Plans to attend groups.  No discharge plans. A:  Medications administered per MD orders.  Emotional support and encouragement given patient. R:  Denied SI and HI, contracts for safety.  Denied A/V hallucinations.  Safety maintained with 15 minute checks.
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
## 2018-07-11 NOTE — Plan of Care (Signed)
  Problem: Activity: Goal: Interest or engagement in activities will improve Outcome: Progressing Note:  Pt reported that she was able to stay out of bed today and attended groups.

## 2018-07-11 NOTE — Progress Notes (Signed)
Patient ID: Judy Robles, female   DOB: 15-Jan-1976, 42 y.o.   MRN: [id number] D: Pt denies SI/HI/AVH, however, affect is blunted and mood is depressed.  Pt was observed in the day room interacting with her peers earlier in shift, and denies any concerns.  A: Pt was given all of medications as scheduled, complained of insomnia and was medicated with Trazodone 5omg for sleep.  R: Will continue to monitor on Q15 minute checks.

## 2018-07-11 NOTE — [facility] Suicide Risk Assessment (Signed)
[facility] INPATIENT:  Family/Significant Other Suicide Prevention Education  Suicide Prevention Education:  Education Completed; mother Azyria Osmon [phone] has been identified by the patient as the family member/significant other with whom the patient will be residing, and identified as the person(s) who will aid the patient in the event of a mental health crisis (suicidal ideations/suicide attempt).  With written consent from the patient, the family member/significant other has been provided the following suicide prevention education, prior to the and/or following the discharge of the patient.  The suicide prevention education provided includes the following:  Suicide risk factors  Suicide prevention and interventions  National Suicide Hotline telephone number  [facility] assessment telephone number  [facility] Emergency Assistance 911  [facility] and/or Residential Mobile Crisis Unit telephone number  Request made of family/significant other to:  Remove weapons (e.g., guns, rifles, knives), all items previously/currently identified as safety concern.  Kathie Rhodes stated there are no guns in the home.  Remove drugs/medications (over-the-counter, prescriptions, illicit drugs), all items previously/currently identified as a safety concern.  The family member/significant other verbalizes understanding of the suicide prevention education information provided.  The family member/significant other agrees to remove the items of safety concern listed above.  Cherie Bohaboy 07/11/2018, 12:52 PM

## 2018-07-11 NOTE — Progress Notes (Signed)
Recreation Therapy Notes  Date: 11.20.19 Time: 0930 Location: 300 Hall Dayroom  Group Topic: Stress Management  Goal Area(s) Addresses:  Patient will verbalize importance of using healthy stress management.  Patient will identify positive emotions associated with healthy stress management.   Intervention: Stress Management  Activity :  Guided Imagery.  LRT introduced the stress management technique of guided imagery.  LRT read a script to guide patients through a peaceful meadow.  Patients were to follow along as script was read to engage in activity.  Education:  Stress Management, Discharge Planning.   Education Outcome: Acknowledges edcuation/In group clarification offered/Needs additional education  Clinical Observations/Feedback: Pt did not attend group.    Caroll Rancher, LRT/CTRS         Caroll Rancher A 07/11/2018 11:23 AM

## 2018-07-11 NOTE — Progress Notes (Signed)
Patient self inventory- Patient slept well last night with sleep medications. Appetite is good, energy level low, concentration poor. Depression, hopelessness, and anxiety rated 6, 6, 7. Denies SI HI AVH/ Denies physical problems. Denies physical pain. Patient's goal is "going to groups."  Patient is compliant with medications prescribed per provider. No side effects noted. Safety is maintained with 15 minute checks as well as environmental checks. Will continue to monitor and provide support.

## 2018-07-11 NOTE — Progress Notes (Signed)
Patient did attend the evening speaker NA meeting.  

## 2018-07-11 NOTE — Plan of Care (Signed)
Patient said she is progressing towards her goal, but said she is having trouble this morning due to her son's father passing away yesterday. PRN Vistaril given. Support given.  Problem: Education: Goal: Mental status will improve Outcome: Progressing Goal: Verbalization of understanding the information provided will improve Outcome: Progressing   Problem: Activity: Goal: Interest or engagement in activities will improve Outcome: Progressing   Problem: Education: Goal: Emotional status will improve Outcome: Not Progressing
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
## 2018-07-11 NOTE — [facility] Group Notes (Signed)
[facility] Group Notes:  (Nursing/MHT/Case Management/Adjunct)  Date:  07/11/2018  Time:  7:56 PM  Type of Therapy:  Nurse Education  Participation Level:  Active  Participation Quality:  Appropriate and Attentive  Affect:  Appropriate  Cognitive:  Alert and Appropriate  Insight:  Appropriate and Good  Engagement in Group:  Improving  Modes of Intervention:  Discussion and Education  Summary of Progress/Problems: RN led group on "Needs Assessment." We discussed positive self affirmation, the triangle of "emotions," "actions," and "thoughts", and Maslow's hierarchy of needs. We discussed the importance of meeting needs in a healthy manner.   Kirstie Mirza 07/11/2018, 7:56 PM

## 2018-07-12 MED ORDER — VENLAFAXINE HCL ER 75 MG PO CP24: 75.0000 mg | ORAL_CAPSULE | Freq: Every day | ORAL | 0 refills | Status: DC

## 2018-07-12 MED ORDER — GABAPENTIN 400 MG PO CAPS: 400.0000 mg | ORAL_CAPSULE | Freq: Three times a day (TID) | ORAL | 1 refills | Status: DC

## 2018-07-12 MED ORDER — LURASIDONE HCL 20 MG PO TABS: 20.0000 mg | ORAL_TABLET | Freq: Every day | ORAL | 0 refills | Status: DC

## 2018-07-12 MED ORDER — TRAZODONE HCL 50 MG PO TABS: 50.0000 mg | ORAL_TABLET | Freq: Every evening | ORAL | 0 refills | Status: DC | PRN
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
## 2018-07-12 NOTE — Progress Notes (Signed)
Discharge note: Patient reviewed discharge paperwork with RN including prescriptions, follow up appointments, and lab work. Patient given the opportunity to ask questions. All concerns were addressed. All belongings were returned to patient. Denied SI/HI/AVH. Patient thanked staff for their care while at the hospital.  Patient was discharged to lobby to where her friend was there to pick her up.

## 2018-07-12 NOTE — Plan of Care (Signed)
  Problem: Education: Goal: Mental status will improve Outcome: Progressing Goal: Verbalization of understanding the information provided will improve Outcome: Progressing   Problem: Activity: Goal: Interest or engagement in activities will improve Outcome: Progressing   

## 2018-07-12 NOTE — [facility] Suicide Risk Assessment (Signed)
[facility] Discharge Suicide Risk Assessment   Principal Problem: MDD (major depressive disorder), recurrent severe, without psychosis (HCC) Discharge Diagnoses: Principal Problem:   MDD (major depressive disorder), recurrent severe, without psychosis (HCC) Active Problems:   Severe benzodiazepine use disorder (HCC)  Total Time spent with patient: 1 hour  Psychiatric Specialty Exam:   Blood pressure 120/71, pulse 82, temperature 98.7 F (37.1 C), temperature source Oral, resp. rate 18, height 5\' 3"  (1.6 m), weight 129.3 kg, last menstrual period 07/07/2018.Body mass index is 50.49 kg/m.   Mental Status Per Nursing Assessment::   On Admission:  Suicidal ideation indicated by patient  Demographic Factors:  Caucasian, Low socioeconomic status and Unemployed  Loss Factors: Financial problems/change in socioeconomic status  Historical Factors: Impulsivity  Risk Reduction Factors:   Positive social support, Positive therapeutic relationship and Positive coping skills or problem solving skills  Continued Clinical Symptoms:  Depression:   Impulsivity Alcohol/Substance Abuse/Dependencies  Cognitive Features That Contribute To Risk:  None    Suicide Risk:  Minimal: No identifiable suicidal ideation.  Patients presenting with no risk factors but with morbid ruminations; may be classified as minimal risk based on the severity of the depressive symptoms  Follow-up Information    [facility], [facility]. Go on 07/16/2018.   Why:  Hospital follow up appointment is Monday, 07/16/18 at 1:00pm.  Please bring your Photo ID and any discharge paperwork from this hospitalization.  Contact information: [address] [place] [address]           Plan Of Care/Follow-up recommendations:  Activity:  as tolerated Diet:  normal Tests:  NA Other:  see above for DC plan  [phone], MD 07/12/2018, 11:13 AM

## 2018-07-12 NOTE — Progress Notes (Signed)
  [facility] Adult Case Management Discharge Plan :  Will you be returning to the same living situation after discharge:  No. Patient reports she is discharging home with her friend in [place], [place].  At discharge, do you have transportation home?: Yes,  patient reports her friend is picking her up at discharge  Do you have the ability to pay for your medications: No.  Release of information consent forms completed and in the chart;  Patient's signature needed at discharge.  Patient to Follow up at: Follow-up Information    [facility]. Go on 07/16/2018.   Why:  Hospital follow up appointment is Monday, 07/16/18 at 1:00pm.  Please bring your Photo ID and any discharge paperwork from this hospitalization.  Contact information: [address] [place] [address] [phone]           Next level of care provider has access to [facility] Link:yes  Safety Planning and Suicide Prevention discussed: Yes,  with the patient's mother  Have you used any form of tobacco in the last 30 days? (Cigarettes, Smokeless Tobacco, Cigars, and/or Pipes): Yes  Has patient been referred to the Quitline?: Patient refused referral  Patient has been referred for addiction treatment: Yes  Judy Robles, LCSWA 07/12/2018, 11:06 AM
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
## 2018-07-20 ENCOUNTER — Inpatient Hospital Stay (HOSPITAL_COMMUNITY)
Admission: AD | Admit: 2018-07-20 | Discharge: 2018-07-23 | DRG: 885 | Disposition: A | Discharge status: Home / Self Care | Payer: Medicaid Other | Source: Intra-hospital | Attending: Psychiatry | Admitting: Psychiatry

## 2018-07-20 ENCOUNTER — Encounter (HOSPITAL_COMMUNITY): Payer: Self-pay | Admitting: Behavioral Health

## 2018-07-20 ENCOUNTER — Other Ambulatory Visit: Payer: Self-pay

## 2018-07-20 DIAGNOSIS — Z818 Family history of other mental and behavioral disorders: Secondary | ICD-10-CM | POA: Diagnosis not present

## 2018-07-20 DIAGNOSIS — F332 Major depressive disorder, recurrent severe without psychotic features: Secondary | ICD-10-CM | POA: Diagnosis present

## 2018-07-20 DIAGNOSIS — F4310 Post-traumatic stress disorder, unspecified: Secondary | ICD-10-CM | POA: Diagnosis present

## 2018-07-20 DIAGNOSIS — E669 Obesity, unspecified: Secondary | ICD-10-CM | POA: Diagnosis present

## 2018-07-20 DIAGNOSIS — Z888 Allergy status to other drugs, medicaments and biological substances status: Secondary | ICD-10-CM | POA: Diagnosis not present

## 2018-07-20 DIAGNOSIS — E7800 Pure hypercholesterolemia, unspecified: Secondary | ICD-10-CM | POA: Diagnosis present

## 2018-07-20 DIAGNOSIS — F410 Panic disorder [episodic paroxysmal anxiety] without agoraphobia: Secondary | ICD-10-CM | POA: Diagnosis present

## 2018-07-20 DIAGNOSIS — Z62810 Personal history of physical and sexual abuse in childhood: Secondary | ICD-10-CM | POA: Diagnosis not present

## 2018-07-20 DIAGNOSIS — G4700 Insomnia, unspecified: Secondary | ICD-10-CM | POA: Diagnosis present

## 2018-07-20 DIAGNOSIS — I10 Essential (primary) hypertension: Secondary | ICD-10-CM | POA: Diagnosis present

## 2018-07-20 DIAGNOSIS — Z8614 Personal history of Methicillin resistant Staphylococcus aureus infection: Secondary | ICD-10-CM

## 2018-07-20 DIAGNOSIS — Z886 Allergy status to analgesic agent status: Secondary | ICD-10-CM

## 2018-07-20 DIAGNOSIS — F17210 Nicotine dependence, cigarettes, uncomplicated: Secondary | ICD-10-CM | POA: Diagnosis present

## 2018-07-20 DIAGNOSIS — F419 Anxiety disorder, unspecified: Secondary | ICD-10-CM | POA: Diagnosis not present

## 2018-07-20 DIAGNOSIS — Z6841 Body Mass Index (BMI) 40.0 and over, adult: Secondary | ICD-10-CM | POA: Diagnosis not present

## 2018-07-20 MED ORDER — GABAPENTIN 400 MG PO CAPS
400.0000 mg | ORAL_CAPSULE | Freq: Three times a day (TID) | ORAL | Status: DC
  Filled 2018-07-20 (×6): qty 1

## 2018-07-20 MED ORDER — ALUM & MAG HYDROXIDE-SIMETH 200-200-20 MG/5ML PO SUSP: 30.0000 mL | ORAL | Status: DC | PRN

## 2018-07-20 MED ORDER — LURASIDONE HCL 20 MG PO TABS
20.0000 mg | ORAL_TABLET | Freq: Every day | ORAL | Status: DC
  Administered 2018-07-21 – 2018-07-22 (×2): 20 mg via ORAL
  Filled 2018-07-20 (×4): qty 1

## 2018-07-20 MED ORDER — VENLAFAXINE HCL ER 75 MG PO CP24
75.0000 mg | ORAL_CAPSULE | Freq: Every day | ORAL | Status: DC
  Filled 2018-07-20 (×2): qty 1

## 2018-07-20 MED ORDER — LORAZEPAM 0.5 MG PO TABS
0.5000 mg | ORAL_TABLET | Freq: Four times a day (QID) | ORAL | Status: DC | PRN
  Administered 2018-07-20 – 2018-07-23 (×9): 0.5 mg via ORAL
  Filled 2018-07-20 (×10): qty 1

## 2018-07-20 MED ORDER — TRAZODONE HCL 50 MG PO TABS: 50.0000 mg | ORAL_TABLET | Freq: Every evening | ORAL | Status: DC | PRN

## 2018-07-20 MED ORDER — HYDROXYZINE HCL 25 MG PO TABS: 25.0000 mg | ORAL_TABLET | Freq: Three times a day (TID) | ORAL | Status: DC | PRN

## 2018-07-20 MED ORDER — GABAPENTIN 300 MG PO CAPS
600.0000 mg | ORAL_CAPSULE | Freq: Three times a day (TID) | ORAL | Status: DC
  Administered 2018-07-20 – 2018-07-23 (×8): 600 mg via ORAL
  Filled 2018-07-20 (×14): qty 2

## 2018-07-20 MED ORDER — TRAZODONE HCL 50 MG PO TABS
50.0000 mg | ORAL_TABLET | Freq: Every evening | ORAL | Status: DC | PRN
  Administered 2018-07-21 – 2018-07-22 (×3): 50 mg via ORAL
  Filled 2018-07-20: qty 1
  Filled 2018-07-20: qty 7
  Filled 2018-07-20 (×2): qty 1

## 2018-07-20 MED ORDER — VENLAFAXINE HCL ER 75 MG PO CP24
112.5000 mg | ORAL_CAPSULE | Freq: Every day | ORAL | Status: DC
  Administered 2018-07-21 – 2018-07-22 (×2): 112.5 mg via ORAL
  Filled 2018-07-20 (×3): qty 1

## 2018-07-20 MED ORDER — MAGNESIUM HYDROXIDE 400 MG/5ML PO SUSP: 30.0000 mL | Freq: Every day | ORAL | Status: DC | PRN

## 2018-07-20 NOTE — Progress Notes (Signed)
Pt presented to [facility] voluntarily from [employer]. Pt noted to be sedated on admission. Writer unable to obtain info. Writer expressed that she's here for suicidal thoughts, anxiety attacks and verbalized that she's currently homeless. Pt v/s assessed, skin assessment completed, belongings searched and required documents reviewed with pt and signed. Pt safely brought onto the unit.

## 2018-07-20 NOTE — [facility] Counselor (Signed)
CSW attempted to complete PSA with the patient, however the patient was asleep and did not respond to her name being called multiple times. CSW will attempt to complete PSA at a later time.   Baldo Daub, MSW, LCSWA Clinical Social Worker [facility]  Phone: [phone]
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
## 2018-07-20 NOTE — Tx Team (Signed)
Initial Treatment Plan 07/20/2018 12:30 PM Judy Robles BLT:[id number]    PATIENT STRESSORS: Financial difficulties Medication change or noncompliance Other: homeless    PATIENT STRENGTHS: Ability for insight Capable of independent living   PATIENT IDENTIFIED PROBLEMS: "increased depression"  "anxiety attacks"  "homeless"                 DISCHARGE CRITERIA:  Ability to meet basic life and health needs Adequate post-discharge living arrangements Improved stabilization in mood, thinking, and/or behavior  PRELIMINARY DISCHARGE PLAN: Attend aftercare/continuing care group Attend PHP/IOP  PATIENT/FAMILY INVOLVEMENT: This treatment plan has been presented to and reviewed with the patient, Judy Robles, and/or family member.  The patient and family have been given the opportunity to ask questions and make suggestions.  Layla Barter, RN 07/20/2018, 12:30 PM
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
## 2018-07-21 MED ORDER — NICOTINE 21 MG/24HR TD PT24
21.0000 mg | MEDICATED_PATCH | Freq: Every day | TRANSDERMAL | Status: DC
  Administered 2018-07-21 – 2018-07-23 (×3): 21 mg via TRANSDERMAL
  Filled 2018-07-21 (×4): qty 1
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
## 2018-07-21 NOTE — Plan of Care (Signed)
  Problem: Education: Goal: Knowledge of [facility] Education information/materials will improve Outcome: Progressing   

## 2018-07-21 NOTE — Progress Notes (Signed)
D Pt presents to the med window first thing this morning and says " can I get my ativan". She wears hospital-issue patient scrubs. Her hair is uncombed and she as just gotten up from her bed. She makes brief eye contact. HEr affect is sad, dark and depressed.    A She completed her daily assessment and on this she wrote she denied SI today and she rated her depression, hopelessness and anxiety " 6/7/5", respectively. She says her anxiety is " very" high rightnow. She is unable to identify the cause for her anxiety.    R Safety  Is in place.

## 2018-07-21 NOTE — Progress Notes (Signed)
D.  Pt pleasant on approach, complaint of anxiety.  Pt was positive for evening wrap up group, observed in dayroom interacting appropriately with peers on the unit.  Pt denies SI/HI/AVH at this time.  A.  Support and encouragement offered, medication given as ordered  R. Pt remains safe on the unit, will continue to monitor.

## 2018-07-21 NOTE — [facility] Group Notes (Signed)
Adult Psychoeducational Group Note  Date:  07/21/2018 Time:  10:06 PM  Group Topic/Focus:  Wrap-Up Group:   The focus of this group is to help patients review their daily goal of treatment and discuss progress on daily workbooks.  Participation Level:  Active  Participation Quality:  Appropriate and Attentive  Affect:  Appropriate  Cognitive:  Alert and Appropriate  Insight: Appropriate and Good  Engagement in Group:  Engaged  Modes of Intervention:  Discussion and Education  Additional Comments:  Pt attended and participated in wrap up group this evening. Pt rated their day a 7/10, due to them going to the gym and completing their goal, which was to stay out of their room.   [name] 07/21/2018, 10:06 PM

## 2018-07-21 NOTE — Progress Notes (Signed)
Pt was in the bed asleep at the beginning of the shift, but she got up after the evening group and just before the [facility] gave out snacks.  Pt reports she has tried to rest today.  She denies SI/HI/AVH at this time.  Writer reviewed meds with pt at which time pt requested Ativan and Trazodone for bedtime.  "I can get another Ativan at 11 o'clock."  Pt sat in the dayroom for a short while with minimal interaction with the other patients.  Pt makes her needs known to staff.  Support and encouragement offered.  Discharge plans are in process.  Safety maintained with q15 minute checks.

## 2018-07-21 NOTE — Progress Notes (Signed)
[facility] MD Progress Note  07/21/2018 11:10 AM Judy Robles  MRN:  [id number] Subjective: Patient reports she is feeling better than she did on admission.  Today denies suicidal ideations.  Denies medication side effects at this time.  Presents future oriented, expresses interest in going to [facility] at discharge. Objective: I have reviewed his chart notes and have met with patient. 42 year old female, known to our unit from prior admissions, most recently earlier in November/2019, at which time was admitted for depression, anxiety, suicidal ideations.  Return to the ED due to worsening anxiety, panic attacks, reported suicidal thoughts of walking into traffic.  She states that in general her mood has been noticeably better than it had been and she feels her current medication regimen (Neurontin, Latuda, Effexor XR) has been effective, but states that anxiety and panic attacks have continued to be a major issue.  Today reports feeling better than she did prior to admission.  Denies suicidal or self-injurious ideations. Reports anxiety has improved since admission. Currently denies medication side effects.  No disruptive or agitated behaviors on unit-limited group participation, cooperative on approach. Principal Problem: Depression Diagnosis: Active Problems:   MDD (major depressive disorder), recurrent severe, without psychosis ([place])  Total Time spent with patient: 15 minutes  Past Psychiatric History:   Past Medical History:  Past Medical History:  Diagnosis Date  . Arthritis   . Bipolar 1 disorder ([place])   . Depression   . Hypercholesterolemia   . Hypertension   . MRSA (methicillin resistant Staphylococcus aureus)   . Obesity   . Renal disease   . Sleep apnea     Past Surgical History:  Procedure Laterality Date  . CESAREAN SECTION    . FRACTURE SURGERY Right    ankle  . I&D EXTREMITY Right 04/16/2014   Procedure: IRRIGATION AND DEBRIDEMENT EXTREMITY;  Surgeon: Linna Hoff, MD;  Location: [facility];  Service: Orthopedics;  Laterality: Right;   Family History:  Family History  Problem Relation Age of Onset  . Throat cancer Mother   . Hypertension Father   . Stroke Father    Family Psychiatric  History:  Social History:  Social History   Substance and Sexual Activity  Alcohol Use Yes   Comment: rare, occasional     Social History   Substance and Sexual Activity  Drug Use Yes  . Types: Cocaine   Comment: occasional cocaine use    Social History   Socioeconomic History  . Marital status: Single    Spouse name: Not on file  . Number of children: Not on file  . Years of education: Not on file  . Highest education level: Not on file  Occupational History  . Not on file  Social Needs  . Financial resource strain: Not on file  . Food insecurity:    Worry: Not on file    Inability: Not on file  . Transportation needs:    Medical: Not on file    Non-medical: Not on file  Tobacco Use  . Smoking status: Current Every Day Smoker    Packs/day: 1.00    Types: Cigarettes  . Smokeless tobacco: Never Used  . Tobacco comment: 1/2PPD  Substance and Sexual Activity  . Alcohol use: Yes    Comment: rare, occasional  . Drug use: Yes    Types: Cocaine    Comment: occasional cocaine use  . Sexual activity: Not Currently    Birth control/protection: Surgical  Lifestyle  . Physical activity:  Days per week: Not on file    Minutes per session: Not on file  . Stress: Not on file  Relationships  . Social connections:    Talks on phone: Not on file    Gets together: Not on file    Attends religious service: Not on file    Active member of club or organization: Not on file    Attends meetings of clubs or organizations: Not on file    Relationship status: Not on file  Other Topics Concern  . Not on file  Social History Narrative  . Not on file   Additional Social History:    Pain Medications: see MAR Prescriptions: see MAR Over the  Counter: see MAR History of alcohol / drug use?: Yes Longest period of sobriety (when/how long): patient states that she has not used any drugs since 2018 Negative Consequences of Use: Financial, Personal relationships, Work / Youth worker Name of Substance 1: Marijuana 1 - Age of First Use: specifics of use are not reported 1 - Last Use / Amount: Last use in 2018 Name of Substance 2: Suboxone 2 - Age of First Use: specifics of use are not reported 2 - Last Use / Amount: Last use was in 2018   Sleep: Improving  Appetite:  Improving  Current Medications: Current Facility-Administered Medications  Medication Dose Route Frequency Provider Last Rate Last Dose  . alum & mag hydroxide-simeth (MAALOX/MYLANTA) 200-200-20 MG/5ML suspension 30 mL  30 mL Oral Q4H PRN Money, Darnelle Maffucci B, FNP      . gabapentin (NEURONTIN) capsule 600 mg  600 mg Oral TID Kimbley Sprague, Myer Peer, MD   600 mg at 07/21/18 0821  . LORazepam (ATIVAN) tablet 0.5 mg  0.5 mg Oral Q6H PRN Greysen Swanton, Myer Peer, MD   0.5 mg at 07/21/18 0821  . lurasidone (LATUDA) tablet 20 mg  20 mg Oral Q supper Money, Darnelle Maffucci B, FNP      . magnesium hydroxide (MILK OF MAGNESIA) suspension 30 mL  30 mL Oral Daily PRN Money, Darnelle Maffucci B, FNP      . nicotine (NICODERM CQ - dosed in mg/24 hours) patch 21 mg  21 mg Transdermal Daily Lindon Romp A, NP   21 mg at 07/21/18 0817  . traZODone (DESYREL) tablet 50 mg  50 mg Oral QHS PRN Money, Lowry Ram, FNP   50 mg at 07/21/18 0001  . venlafaxine XR (EFFEXOR-XR) 24 hr capsule 112.5 mg  112.5 mg Oral Q breakfast Lizzie An, Myer Peer, MD   112.5 mg at 07/21/18 0818    Lab Results: No results found for this or any previous visit (from the past 48 hour(s)).  Blood Alcohol level:  Lab Results  Component Value Date   ETH <10 11/05/2017   ETH <10 60/73/7106    Metabolic Disorder Labs: Lab Results  Component Value Date   HGBA1C 5.3 06/06/2018   MPG 105.41 06/06/2018   MPG 114 04/16/2014   No results found for:  PROLACTIN Lab Results  Component Value Date   CHOL 180 06/06/2018   TRIG 128 06/06/2018   HDL 49 06/06/2018   CHOLHDL 3.7 06/06/2018   VLDL 26 06/06/2018   LDLCALC 105 (H) 06/06/2018   LDLCALC 50 09/05/2017    Physical Findings: AIMS: Facial and Oral Movements Muscles of Facial Expression: None, normal Lips and Perioral Area: None, normal Jaw: None, normal Tongue: None, normal,Extremity Movements Upper (arms, wrists, hands, fingers): None, normal Lower (legs, knees, ankles, toes): None, normal, Trunk Movements Neck, shoulders, hips:  None, normal, Overall Severity Incapacitation due to abnormal movements: None, normal Patient's awareness of abnormal movements (rate only patient's report): No Awareness, Dental Status Current problems with teeth and/or dentures?: No Does patient usually wear dentures?: No  CIWA:    COWS:     Musculoskeletal: Strength & Muscle Tone: within normal limits Gait & Station: normal Patient leans: N/A  Psychiatric Specialty Exam: Physical Exam  ROS no headache, no chest pain, no shortness of breath, no vomiting   Blood pressure 111/71, pulse 77, temperature 98.6 F (37 C), temperature source Oral, resp. rate 16, height _0  (1.6 m), weight 127 kg, last menstrual period 07/07/2018.Body mass index is 49.6 kg/m.  General Appearance: Fairly Groomed  Eye Contact:  Good  Speech:  Normal Rate  Volume:  Normal  Mood:  Reports mood as better than it was prior to admission  Affect:  Smiles briefly at times, less anxious  Thought Process:  Linear and Descriptions of Associations: Intact  Orientation:  Other:  Fully alert and attentive  Thought Content:  Denies hallucinations, no delusions  Suicidal Thoughts:  No-currently denies suicidal or self-injurious ideations, contracts for safety on unit  Homicidal Thoughts:  No  Memory:  Recent and remote grossly intact  Judgement:  Other:  Fair/improving  Insight:  Fair  Psychomotor Activity:  Decreased   Concentration:  Concentration: Good and Attention Span: Good  Recall:  Good  Fund of Knowledge:  Good  Language:  Good  Akathisia:  Negative  Handed:  Right  AIMS (if indicated):     Assets:  Desire for Improvement Resilience  ADL's:  Intact  Cognition:  WNL  Sleep:  Number of Hours: 6.25   Assessment -  42 year old female, known to our unit from prior admissions, most recently earlier in November/2019, at which time was admitted for depression, anxiety, suicidal ideations.  Return to the ED due to worsening anxiety, panic attacks, reported suicidal thoughts of walking into traffic.  She states that in general her mood has been noticeably better than it had been and she feels her current medication regimen (Neurontin, Latuda, Effexor XR) has been effective, but states that anxiety and panic attacks have continued to be a major issue.  Today patient reports feeling better than she did prior to admission, denies suicidal ideations at present and presents future oriented, focusing more on disposition planning options.  Describes improving anxiety.  Currently tolerating medications well, does not endorse side effects.  Treatment Plan Summary: Daily contact with patient to assess and evaluate symptoms and progress in treatment, Medication management, Plan inpatient treatment and medications as below Encourage group and milieu participation to work on coping skills and symptom reduction Continue Neurontin 600 mg 3 times a day for anxiety, pain Continue Latuda 20 mg daily for mood disorder Continue Effexor XR 112.5 mg daily for depression and anxiety Continue Trazodone 50 mg at nighttime PRN as needed for insomnia Continue Ativan 0.5 mg every 6 hours PRN as needed for anxiety Treatment team working on disposition planning-patient interested in going to [facility], MD 07/21/2018, 11:10 AM

## 2018-07-21 NOTE — [facility] Group Notes (Signed)
LCSW Group Therapy Note  07/21/2018    10:00-11:00am   Type of Therapy and Topic:  Group Therapy: Early Messages Received About Anger  Participation Level:  Did Not Attend   Description of Group:   In this group, patients shared and discussed the early messages received in their lives about anger through parental or other adult modeling, teaching, repression, punishment, violence, and more.  Participants identified how those childhood lessons influence even now how they usually or often react when angered.  The group discussed that anger is a secondary emotion and what may be the underlying emotional themes that come out through anger outbursts or that are ignored through anger suppression.  Finally, as a group there was a conversation about the workbook's quote that "There is nothing wrong with anger; it is just a sign something needs to change."     Therapeutic Goals: 1. Patients will identify one or more childhood message about anger that they received and how it was taught to them. 2. Patients will discuss how these childhood experiences have influenced and continue to influence their own expression or repression of anger even today. 3. Patients will explore possible primary emotions that tend to fuel their secondary emotion of anger. 4. Patients will learn that anger itself is normal and cannot be eliminated, and that healthier coping skills can assist with resolving conflict rather than worsening situations.  Summary of Patient Progress:  N/A  Therapeutic Modalities:   Cognitive Behavioral Therapy Motivation Interviewing  Lynnell Chad  .

## 2018-07-22 MED ORDER — VENLAFAXINE HCL ER 150 MG PO CP24
150.0000 mg | ORAL_CAPSULE | Freq: Every day | ORAL | Status: DC
  Administered 2018-07-23: 150 mg via ORAL
  Filled 2018-07-22 (×3): qty 1

## 2018-07-22 NOTE — Plan of Care (Signed)
  Problem: Education: Goal: Knowledge of [facility] Education information/materials will improve Outcome: Progressing   

## 2018-07-22 NOTE — Progress Notes (Signed)
[facility] MD Progress Note  07/22/2018 11:58 AM Judy Robles  MRN:  [id number] Subjective: Patient reports "I am feeling better".  States her anxiety has improved, has not had any panic attacks,  at this time denies suicidal ideations, denies medication side effects, describes improving mood and presents future oriented, focusing on discharge planning, stating that she plans to go to [facility].  Objective: I have reviewed his chart notes and have met with patient. 42 year old female, known to our unit from prior admissions, most recently earlier in November/2019, at which time was admitted for depression, anxiety, suicidal ideations.  Return to the ED due to worsening anxiety, panic attacks, reported suicidal thoughts of walking into traffic.   At this time describes improving mood, states she feels "better", does not endorse major or significant neurovegetative symptoms of depression at this time.  Denies hallucinations and does not appear internally preoccupied.  Presents future oriented. Denies medication side effects. No disruptive or agitated behaviors on unit, some group participation Principal Problem: Depression Diagnosis: Active Problems:   MDD (major depressive disorder), recurrent severe, without psychosis ([place])  Total Time spent with patient: 15 minutes  Past Psychiatric History:   Past Medical History:  Past Medical History:  Diagnosis Date  . Arthritis   . Bipolar 1 disorder ([place])   . Depression   . Hypercholesterolemia   . Hypertension   . MRSA (methicillin resistant Staphylococcus aureus)   . Obesity   . Renal disease   . Sleep apnea     Past Surgical History:  Procedure Laterality Date  . CESAREAN SECTION    . FRACTURE SURGERY Right    ankle  . I&D EXTREMITY Right 04/16/2014   Procedure: IRRIGATION AND DEBRIDEMENT EXTREMITY;  Surgeon: Linna Hoff, MD;  Location: [facility];  Service: Orthopedics;  Laterality: Right;   Family History:  Family History   Problem Relation Age of Onset  . Throat cancer Mother   . Hypertension Father   . Stroke Father    Family Psychiatric  History:  Social History:  Social History   Substance and Sexual Activity  Alcohol Use Yes   Comment: rare, occasional     Social History   Substance and Sexual Activity  Drug Use Yes  . Types: Cocaine   Comment: occasional cocaine use    Social History   Socioeconomic History  . Marital status: Single    Spouse name: Not on file  . Number of children: Not on file  . Years of education: Not on file  . Highest education level: Not on file  Occupational History  . Not on file  Social Needs  . Financial resource strain: Not on file  . Food insecurity:    Worry: Not on file    Inability: Not on file  . Transportation needs:    Medical: Not on file    Non-medical: Not on file  Tobacco Use  . Smoking status: Current Every Day Smoker    Packs/day: 1.00    Types: Cigarettes  . Smokeless tobacco: Never Used  . Tobacco comment: 1/2PPD  Substance and Sexual Activity  . Alcohol use: Yes    Comment: rare, occasional  . Drug use: Yes    Types: Cocaine    Comment: occasional cocaine use  . Sexual activity: Not Currently    Birth control/protection: Surgical  Lifestyle  . Physical activity:    Days per week: Not on file    Minutes per session: Not on file  .  Stress: Not on file  Relationships  . Social connections:    Talks on phone: Not on file    Gets together: Not on file    Attends religious service: Not on file    Active member of club or organization: Not on file    Attends meetings of clubs or organizations: Not on file    Relationship status: Not on file  Other Topics Concern  . Not on file  Social History Narrative  . Not on file   Additional Social History:    Pain Medications: see MAR Prescriptions: see MAR Over the Counter: see MAR History of alcohol / drug use?: Yes Longest period of sobriety (when/how long): patient states  that she has not used any drugs since 2018 Negative Consequences of Use: Financial, Personal relationships, Work / Youth worker Name of Substance 1: Marijuana 1 - Age of First Use: specifics of use are not reported 1 - Last Use / Amount: Last use in 2018 Name of Substance 2: Suboxone 2 - Age of First Use: specifics of use are not reported 2 - Last Use / Amount: Last use was in 2018   Sleep: Improving  Appetite:  Improving  Current Medications: Current Facility-Administered Medications  Medication Dose Route Frequency Provider Last Rate Last Dose  . alum & mag hydroxide-simeth (MAALOX/MYLANTA) 200-200-20 MG/5ML suspension 30 mL  30 mL Oral Q4H PRN Money, Darnelle Maffucci B, FNP      . gabapentin (NEURONTIN) capsule 600 mg  600 mg Oral TID Cobos, Myer Peer, MD   600 mg at 07/22/18 0912  . LORazepam (ATIVAN) tablet 0.5 mg  0.5 mg Oral Q6H PRN Cobos, Myer Peer, MD   0.5 mg at 07/22/18 0523  . lurasidone (LATUDA) tablet 20 mg  20 mg Oral Q supper Money, Lowry Ram, FNP   20 mg at 07/21/18 1702  . magnesium hydroxide (MILK OF MAGNESIA) suspension 30 mL  30 mL Oral Daily PRN Money, Darnelle Maffucci B, FNP      . nicotine (NICODERM CQ - dosed in mg/24 hours) patch 21 mg  21 mg Transdermal Daily Lindon Romp A, NP   21 mg at 07/22/18 0913  . traZODone (DESYREL) tablet 50 mg  50 mg Oral QHS PRN Money, Lowry Ram, FNP   50 mg at 07/21/18 2048  . venlafaxine XR (EFFEXOR-XR) 24 hr capsule 112.5 mg  112.5 mg Oral Q breakfast Cobos, Myer Peer, MD   112.5 mg at 07/22/18 8921    Lab Results: No results found for this or any previous visit (from the past 48 hour(s)).  Blood Alcohol level:  Lab Results  Component Value Date   ETH <10 11/05/2017   ETH <10 19/41/7408    Metabolic Disorder Labs: Lab Results  Component Value Date   HGBA1C 5.3 06/06/2018   MPG 105.41 06/06/2018   MPG 114 04/16/2014   No results found for: PROLACTIN Lab Results  Component Value Date   CHOL 180 06/06/2018   TRIG 128 06/06/2018   HDL 49  06/06/2018   CHOLHDL 3.7 06/06/2018   VLDL 26 06/06/2018   LDLCALC 105 (H) 06/06/2018   LDLCALC 50 09/05/2017    Physical Findings: AIMS: Facial and Oral Movements Muscles of Facial Expression: None, normal Lips and Perioral Area: None, normal Jaw: None, normal Tongue: None, normal,Extremity Movements Upper (arms, wrists, hands, fingers): None, normal Lower (legs, knees, ankles, toes): None, normal, Trunk Movements Neck, shoulders, hips: None, normal, Overall Severity Incapacitation due to abnormal movements: None, normal Patient's awareness of  abnormal movements (rate only patient's report): No Awareness, Dental Status Current problems with teeth and/or dentures?: No Does patient usually wear dentures?: No  CIWA:    COWS:     Musculoskeletal: Strength & Muscle Tone: within normal limits Gait & Station: normal Patient leans: N/A  Psychiatric Specialty Exam: Physical Exam  ROS no headache, no chest pain, no shortness of breath, no vomiting   Blood pressure 120/66, pulse 84, temperature 98.6 F (37 C), temperature source Oral, resp. rate 16, height 5' 3"  (1.6 m), weight 127 kg, last menstrual period 07/07/2018.Body mass index is 49.6 kg/m.  General Appearance: Fairly Groomed  Eye Contact:  Good  Speech:  Normal Rate  Volume:  Normal  Mood:  Improving mood  Affect:  Affect more reactive  Thought Process:  Linear and Descriptions of Associations: Intact  Orientation:  Other:  Fully alert and attentive  Thought Content:  Denies hallucinations, no delusions  Suicidal Thoughts:  No-currently denies suicidal or self-injurious ideations, contracts for safety on unit  Homicidal Thoughts:  No  Memory:  Recent and remote grossly intact  Judgement:  Other:  Improving  Insight:  Improving  Psychomotor Activity:  Decreased  Concentration:  Concentration: Good and Attention Span: Good  Recall:  Good  Fund of Knowledge:  Good  Language:  Good  Akathisia:  Negative  Handed:  Right   AIMS (if indicated):     Assets:  Desire for Improvement Resilience  ADL's:  Intact  Cognition:  WNL  Sleep:  Number of Hours: 6.75   Assessment -  42 year old female, known to our unit from prior admissions, most recently earlier in November/2019, at which time was admitted for depression, anxiety, suicidal ideations.  Return to the ED due to worsening anxiety, panic attacks, reported suicidal thoughts of walking into traffic.    Today patient reports feeling better than she did prior to admission, with decreased anxiety and improved mood.  Today denies suicidal ideations at present and presents future oriented, focusing more on disposition planning options.Tolerating Latuda, Effexor XR, Neurontin well at this time.  Treatment Plan Summary: Daily contact with patient to assess and evaluate symptoms and progress in treatment, Medication management, Plan inpatient treatment and medications as below  Treatment plan reviewed as below today 12/1 Encourage group and milieu participation to work on coping skills and symptom reduction Continue Neurontin 600 mg 3 times a day for anxiety, pain Continue Latuda 20 mg daily for mood disorder Increase Effexor XR to 150  mg daily for depression and anxiety Continue Trazodone 50 mg at nighttime PRN as needed for insomnia Continue Ativan 0.5 mg every 6 hours PRN as needed for anxiety Treatment team working on disposition planning-patient interested in going to [facility], MD 07/22/2018, 11:58 AM   Patient ID: Judy Robles, female   DOB: 1976/04/16, 42 y.o.   MRN: [id number]

## 2018-07-22 NOTE — [facility] Group Notes (Signed)
[facility] Group Notes:  (Nursing/MHT/Case Management/Adjunct)  Date:  07/22/2018  Time:  1330 Type of Therapy:  Nurse Education with Marlyn Corporal, RN  Participation Level:  Active  Participation Quality:  Appropriate  Affect:  Appropriate  Cognitive:  Appropriate  Insight:  Appropriate  Engagement in Group:  Engaged  Modes of Intervention:  Discussion and Education

## 2018-07-22 NOTE — [facility] Group Notes (Signed)
[facility] LCSW Group Therapy Note  07/22/2018  10:00-11:00AM  Type of Therapy and Topic:  Group Therapy:  Adding Supports Including Being Your Own Support  Participation Level:  Did Not Attend   Description of Group:  Patients in this group were introduced to the concept that additional supports including self-support are an essential part of recovery.  A song entitled "I Need Help!" was played and a group discussion was held in reaction to the idea of needing to add supports.  A song entitled "My Own Hero" was played and a group discussion ensued in which patients stated they could relate to the song and it inspired them to realize they have be willing to help themselves in order to succeed, because other people cannot achieve sobriety or stability for them.  We discussed adding a variety of healthy supports to address the various needs in their lives.  A song was played called "I Know Where I've Been" toward the end of group and used to conduct an inspirational wrap-up to group of remembering how far they have already come in their journey.  Therapeutic Goals: 1)  demonstrate the importance of being a part of one's own support system 2)  discuss reasons people in one's life may eventually be unable to be continually supportive  3)  identify the patient's current support system and   4)  elicit commitments to add healthy supports and to become more conscious of being self-supportive   Summary of Patient Progress:  N/A   Therapeutic Modalities:   Motivational Interviewing Activity  Lynnell Chad

## 2018-07-22 NOTE — [facility] Group Notes (Signed)
Adult Psychoeducational Group Note  Date:  07/22/2018 Time:  10:08 PM  Group Topic/Focus:  Wrap-Up Group:   The focus of this group is to help patients review their daily goal of treatment and discuss progress on daily workbooks.  Participation Level:  Active  Participation Quality:  Appropriate and Attentive  Affect:  Appropriate  Cognitive:  Alert and Appropriate  Insight: Appropriate and Good  Engagement in Group:  Engaged  Modes of Intervention:  Discussion and Education  Additional Comments:  Pt attended and participated in wrap up group this evening. Pt rated their day an 8/10, due to them laughing and having fun today. Pt also cried today, but told writer it was a good thing because they were releasing emotions. Pt has an ongoing goal to go to all groups. Pt was able to get up for breakfast, which was a goal for them.   [name] 07/22/2018, 10:08 PM

## 2018-07-22 NOTE — [facility] Group Notes (Signed)
[facility] Group Notes:  (Nursing)  Date:  07/22/2018  Time:130 PM Type of Therapy:  Nurse Education  Participation Level:  Active  Participation Quality:  Appropriate and Attentive  Affect:  Appropriate  Cognitive:  Alert and Appropriate  Insight:  Appropriate and Good  Engagement in Group:  Engaged  Modes of Intervention:  Discussion and Education  Summary of Progress/Problems:   Nurse led group: Identifying Needs and Healthier Coping Skills  Judy Robles 07/22/2018, 3:05 PM

## 2018-07-22 NOTE — [facility] Suicide Risk Assessment (Signed)
[facility] INPATIENT:  Family/Significant Other Suicide Prevention Education  Suicide Prevention Education:  Contact Attempts: Judy Robles has been identified by the patient as the family member/significant other with whom the patient will be residing, and identified as the person(s) who will aid the patient in the event of a mental health crisis.  With written consent from the patient, two attempts were made to provide suicide prevention education, prior to and/or following the patient's discharge.  We were unsuccessful in providing suicide prevention education.  A suicide education pamphlet was given to the patient to share with family/significant other.  Date and time of first attempt: 07/22/18/1300 Date and time of second attempt:07/22/18/1600  Evorn Gong 07/22/2018, 4:21 PM

## 2018-07-22 NOTE — Progress Notes (Signed)
D.  Pt pleasant on approach, no complaints voiced at this time.  Pt was positive for evening wrap up group, observed appropriately engaged with peers on the unit.  Pt denies SI/HI/AVH at this time.  A.  Support and encouragement offered, medication given as ordered  R.  Pt remains safe on the unit, will continue to monitor.   

## 2018-07-22 NOTE — Progress Notes (Signed)
D Pt presents OOB UAL on the 400 hall today she tolerates this well. She remains disheveled, flat and depressed in demeanor and affect.     A She takes her scheduled meds as planned and she completes her daily assessment and on this she wrote she denied SI today and she rated her depression, hopelessness and anxiety " 7/6/5", respectively. She says she is " beginning to feel better" and says she " does ok when I'm on my meds". She is encouraged by this Clinical research associate to idnetify a goal for the day and will get back to writer on what hers will be.     R Safety is in place.

## 2018-07-22 NOTE — [facility] Suicide Risk Assessment (Signed)
[facility] INPATIENT:  Family/Significant Other Suicide Prevention Education  Suicide Prevention Education:  Contact Attempts: mother Keidra Withers [phone], (name of family member/significant other) has been identified by the patient as the family member/significant other with whom the patient will be residing, and identified as the person(s) who will aid the patient in the event of a mental health crisis.  With written consent from the patient, two attempts were made to provide suicide prevention education, prior to and/or following the patient's discharge.  We were unsuccessful in providing suicide prevention education.  A suicide education pamphlet was given to the patient to share with family/significant other.  Date and time of first attempt: 07/22/18 at 2:58 PM - went straight to voicemail multiple times attempted to call.  Date and time of second attempt: to be completed   Shellia Cleverly 07/22/2018, 2:57 PM

## 2018-07-23 MED ORDER — VENLAFAXINE HCL ER 150 MG PO CP24: 150.0000 mg | ORAL_CAPSULE | Freq: Every day | ORAL | 0 refills | Status: DC

## 2018-07-23 MED ORDER — NICOTINE 21 MG/24HR TD PT24: 21.0000 mg | MEDICATED_PATCH | Freq: Every day | TRANSDERMAL | 0 refills | Status: DC

## 2018-07-23 MED ORDER — GABAPENTIN 300 MG PO CAPS: 600.0000 mg | ORAL_CAPSULE | Freq: Three times a day (TID) | ORAL | 0 refills | Status: DC

## 2018-07-23 MED ORDER — LURASIDONE HCL 20 MG PO TABS: 20.0000 mg | ORAL_TABLET | Freq: Every day | ORAL | 0 refills | Status: DC

## 2018-07-23 MED ORDER — TRAZODONE HCL 50 MG PO TABS: 50.0000 mg | ORAL_TABLET | Freq: Every evening | ORAL | 0 refills | Status: DC | PRN

## 2018-07-23 NOTE — Tx Team (Signed)
Interdisciplinary Treatment and Diagnostic Plan Update  07/23/2018 Time of Session: 9:00am Judy Robles MRN: [id number]  Principal Diagnosis: MDD (major depressive disorder), recurrent severe, without psychosis (HCC)  Secondary Diagnoses: Active Problems:   MDD (major depressive disorder), recurrent severe, without psychosis (HCC)   Current Medications:  Current Facility-Administered Medications  Medication Dose Route Frequency Provider Last Rate Last Dose  . alum & mag hydroxide-simeth (MAALOX/MYLANTA) 200-200-20 MG/5ML suspension 30 mL  30 mL Oral Q4H PRN Money, Feliz Beam B, FNP      . gabapentin (NEURONTIN) capsule 600 mg  600 mg Oral TID Cobos, Rockey Situ, MD   600 mg at 07/23/18 0752  . LORazepam (ATIVAN) tablet 0.5 mg  0.5 mg Oral Q6H PRN Cobos, Rockey Situ, MD   0.5 mg at 07/23/18 0626  . lurasidone (LATUDA) tablet 20 mg  20 mg Oral Q supper Money, Gerlene Burdock, FNP   20 mg at 07/22/18 1637  . magnesium hydroxide (MILK OF MAGNESIA) suspension 30 mL  30 mL Oral Daily PRN Money, Feliz Beam B, FNP      . nicotine (NICODERM CQ - dosed in mg/24 hours) patch 21 mg  21 mg Transdermal Daily Nira Conn A, NP   21 mg at 07/23/18 0751  . traZODone (DESYREL) tablet 50 mg  50 mg Oral QHS PRN Money, Gerlene Burdock, FNP   50 mg at 07/22/18 2059  . venlafaxine XR (EFFEXOR-XR) 24 hr capsule 150 mg  150 mg Oral Q breakfast Cobos, Rockey Situ, MD   150 mg at 07/23/18 0753   PTA Medications: Medications Prior to Admission  Medication Sig Dispense Refill Last Dose  . gabapentin (NEURONTIN) 400 MG capsule Take 400 mg by mouth 3 (three) times daily.   Past Week at Unknown time  . lurasidone (LATUDA) 20 MG TABS tablet Take 1 tablet (20 mg total) by mouth daily with supper. 30 tablet 0 Past Week at Unknown time  . traZODone (DESYREL) 50 MG tablet Take 1 tablet (50 mg total) by mouth at bedtime as needed for sleep. 30 tablet 0   . venlafaxine XR (EFFEXOR-XR) 75 MG 24 hr capsule Take 1 capsule (75 mg total) by mouth daily  with breakfast. 30 capsule 0     Patient Stressors: Financial difficulties Medication change or noncompliance Other: homeless   Patient Strengths: Ability for insight Capable of independent living  Treatment Modalities: Medication Management, Group therapy, Case management,  1 to 1 session with clinician, Psychoeducation, Recreational therapy.   Physician Treatment Plan for Primary Diagnosis: <principal problem not specified> Long Term Goal(s): Improvement in symptoms so as ready for discharge Improvement in symptoms so as ready for discharge   Short Term Goals: Ability to identify changes in lifestyle to reduce recurrence of condition will improve Ability to maintain clinical measurements within normal limits will improve Ability to identify changes in lifestyle to reduce recurrence of condition will improve Ability to verbalize feelings will improve Ability to disclose and discuss suicidal ideas Ability to demonstrate self-control will improve Ability to identify and develop effective coping behaviors will improve Ability to maintain clinical measurements within normal limits will improve  Medication Management: Evaluate patient's response, side effects, and tolerance of medication regimen.  Therapeutic Interventions: 1 to 1 sessions, Unit Group sessions and Medication administration.  Evaluation of Outcomes: Adequate for Discharge  Physician Treatment Plan for Secondary Diagnosis: Active Problems:   MDD (major depressive disorder), recurrent severe, without psychosis (HCC)  Long Term Goal(s): Improvement in symptoms so as ready for discharge Improvement  in symptoms so as ready for discharge   Short Term Goals: Ability to identify changes in lifestyle to reduce recurrence of condition will improve Ability to maintain clinical measurements within normal limits will improve Ability to identify changes in lifestyle to reduce recurrence of condition will improve Ability to  verbalize feelings will improve Ability to disclose and discuss suicidal ideas Ability to demonstrate self-control will improve Ability to identify and develop effective coping behaviors will improve Ability to maintain clinical measurements within normal limits will improve     Medication Management: Evaluate patient's response, side effects, and tolerance of medication regimen.  Therapeutic Interventions: 1 to 1 sessions, Unit Group sessions and Medication administration.  Evaluation of Outcomes: Adequate for Discharge   RN Treatment Plan for Primary Diagnosis: <principal problem not specified> Long Term Goal(s): Knowledge of disease and therapeutic regimen to maintain health will improve  Short Term Goals: Ability to verbalize frustration and anger appropriately will improve, Ability to demonstrate self-control, Ability to verbalize feelings will improve and Ability to disclose and discuss suicidal ideas  Medication Management: RN will administer medications as ordered by provider, will assess and evaluate patient's response and provide education to patient for prescribed medication. RN will report any adverse and/or side effects to prescribing provider.  Therapeutic Interventions: 1 on 1 counseling sessions, Psychoeducation, Medication administration, Evaluate responses to treatment, Monitor vital signs and CBGs as ordered, Perform/monitor CIWA, COWS, AIMS and Fall Risk screenings as ordered, Perform wound care treatments as ordered.  Evaluation of Outcomes: Adequate for Discharge   LCSW Treatment Plan for Primary Diagnosis: MDD (major depressive disorder), recurrent severe, without psychosis (HCC) Long Term Goal(s): Safe transition to appropriate next level of care at discharge, Engage patient in therapeutic group addressing interpersonal concerns.  Short Term Goals: Engage patient in aftercare planning with referrals and resources, Increase social support, Increase emotional  regulation and Increase skills for wellness and recovery  Therapeutic Interventions: Assess for all discharge needs, 1 to 1 time with Social worker, Explore available resources and support systems, Assess for adequacy in community support network, Educate family and significant other(s) on suicide prevention, Complete Psychosocial Assessment, Interpersonal group therapy.  Evaluation of Outcomes: Adequate for Discharge   Progress in Treatment: Attending groups: Yes. Participating in groups: Yes. Taking medication as prescribed: Yes. Toleration medication: Yes. Family/Significant other contact made: Yes, individual(s) contacted:  2 attempts to reach mother, Judy Robles Patient understands diagnosis: Yes. Discussing patient identified problems/goals with staff: Yes. Medical problems stabilized or resolved: Yes. Denies suicidal/homicidal ideation: Yes. Issues/concerns per patient self-inventory: No.  New problem(s) identified: No, Describe:  patient will discharge to [facility] Short Term/Long Term Goal(s): medication management for mood stabilization; elimination of SI thoughts; development of comprehensive mental wellness/sobriety plan.  Patient Goals:  "Get my medications regulated and to attend groups. Get a lot of (this hospitalization)."  Discharge Plan or Barriers: Patient discharging to [facility] [employer] pamphlet, [place] [facility] information, and AA/NA information provided to patient for additional community support and resources.   Reason for Continuation of Hospitalization: Anxiety Depression  Estimated Length of Stay: 07/23/18  Attendees: Patient: Judy Robles 07/23/2018 8:45 AM  Physician: Judy Robles 07/23/2018 8:45 AM  Nursing: Judy Robles 07/23/2018 8:45 AM  RN Care Manager: 07/23/2018 8:45 AM  Social Worker: Judy Robles, CSW 07/23/2018 8:45 AM  Recreational Therapist:  07/23/2018 8:45 AM  Other:  07/23/2018 8:45 AM  Other:  07/23/2018 8:45 AM  Other:  07/23/2018 8:45 AM    Scribe for Treatment  Team: Darreld Mclean, LCSWA 07/23/2018 8:45 AM
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
## 2018-07-23 NOTE — BHH Group Notes (Signed)
Adult Psychoeducational Group Note  Date:  07/23/2018 Time:  9:00 AM  Group Topic/Focus:  Goals Group:   The focus of this group is to help patients establish daily goals to achieve during treatment and discuss how the patient can incorporate goal setting into their daily lives to aide in recovery.  Participation Level:  Active  Participation Quality:  Appropriate  Affect:  Appropriate  Cognitive:  Alert  Insight: Appropriate  Engagement in Group:  Engaged  Modes of Intervention:  Discussion  Additional Comments:   Pt attended and participated in orientation/goals group facilitated by MHT Donnetta Simpers 07/23/2018, 9:00 AM

## 2018-07-23 NOTE — Progress Notes (Signed)
Recreation Therapy Notes  Date: 12.2.19 Time: 0930 Location: 300 Hall Dayroom  Group Topic: Stress Management  Goal Area(s) Addresses:  Patient will verbalize importance of using healthy stress management.  Patient will identify positive emotions associated with healthy stress management.   Intervention: Stress Management  Activity :  Meditation.  LRT introduced the stress management technique of meditation.  LRT played a meditation dealing with impermanence.  Patients were to listen and follow along as the meditation played to engage in the technique.  Education:  Stress Management, Discharge Planning.   Education Outcome: Acknowledges edcuation/In group clarification offered/Needs additional education  Clinical Observations/Feedback: Pt did not attend group.    Caroll Rancher, LRT/CTRS         Lillia Abed, Sunjai Levandoski A 07/23/2018 11:20 AM

## 2018-07-23 NOTE — BHH Suicide Risk Assessment (Addendum)
[facility] Discharge Suicide Risk Assessment   Principal Problem: Depression, Anxiety Discharge Diagnoses: Active Problems:   MDD (major depressive disorder), recurrent severe, without psychosis (HCC)   Total Time spent with patient: 30 minutes  Musculoskeletal: Strength & Muscle Tone: within normal limits Gait & Station: normal Patient leans: N/A  Psychiatric Specialty Exam: ROS no headache, no chest pain, no shortness of breath, no vomiting   Blood pressure 128/86, pulse 80, temperature 98.8 F (37.1 C), temperature source Oral, resp. rate 16, height 5\' 3"  (1.6 m), weight 127 kg, last menstrual period 07/07/2018.Body mass index is 49.6 kg/m.  General Appearance: improving grooming   Eye Contact::  Good  Speech:  Normal Rate409  Volume:  Normal  Mood:  improving mood , currently denies depression, presents euthymic  Affect:  Appropriate and reactive  Thought Process:  Linear and Descriptions of Associations: Intact  Orientation:  Full (Time, Place, and Person)  Thought Content:  no hallucinations, no delusions , not internally preoccupied   Suicidal Thoughts:  No denies suicidal or self injurious ideations, no homicidal or violent ideations  Homicidal Thoughts:  No  Memory:  recent and remote grossly intact   Judgement:  Good  Insight:  Good  Psychomotor Activity:  Normal  Concentration:  Good  Recall:  Good  Fund of Knowledge:Good  Language: Good  Akathisia:  Negative  Handed:  Right  AIMS (if indicated):     Assets:  Communication Skills Desire for Improvement Resilience  Sleep:  Number of Hours: 6.75  Cognition: WNL  ADL's:  Intact   Mental Status Per Nursing Assessment::   On Admission:  Suicidal ideation indicated by patient  Demographic Factors:  42, separated, has two children, who live with their father, unemployed, had been staying with friend prior to admission  Loss Factors: Unemployment , unstable housing  Historical Factors: History of prior  psychiatric admissions, history of depression, no prior suicide attempts   Risk Reduction Factors:   Positive coping skills or problem solving skills  Continued Clinical Symptoms:  At this time patient is alert, attentive, better groomed, mood improved and currently euthymic, denies feeling depressed, affect fuller in range, no thought disorder, no SI or HI, no psychotic symptoms, future oriented . Denies medication side effects, tolerating medications well .Side effects reviewed, including risk of metabolic, movement disorders on Latuda, increased BP on Effexor XR, and risk of Venlafaxine WDL if stopped abruptly. Behavior on unit in good control, pleasant on approach. Patient has history of Hep C- we discussed importance and benefits of establishing outpatient treatment for monitoring and treatment .   Cognitive Features That Contribute To Risk:  No gross cognitive deficits noted upon discharge. Is alert , attentive, and oriented x 3   Suicide Risk:  Mild:  Suicidal ideation of limited frequency, intensity, duration, and specificity.  There are no identifiable plans, no associated intent, mild dysphoria and related symptoms, good self-control (both objective and subjective assessment), few other risk factors, and identifiable protective factors, including available and accessible social support.    Plan Of Care/Follow-up recommendations:  Activity:  as tolerated  Diet:  heart healthy Tests:  as needed  Other:  see below  Patient is expressing readiness for discharge- plans to go to [facility] and establish outpatient psychiatric and medical care locally in [place]. * patient aware of Hep C (+) status- we have discussed importance of establishing outpatient treatment in order to monitor and treat . States she will establish treatment with PCP in [place] .  Craige Cotta, MD 07/23/2018, 8:24 AM

## 2018-07-23 NOTE — Progress Notes (Signed)
Discharge Note:  Patient did not have her wallet and phone when she discharged.  Patient's items were left at [facility].  Nurse called [facility] and they did have these items.  [employer] called and they will go to [facility], pick up patient's items, bring items to [facility], and then take patient and her belongings to [facility].  AC informed of patient's needs.  Patient denied SI and HI.  Denied A/V hallucinations.  Denied pain.  Suicide prevention information given and discussed with patient who stated she understood and had no questions.  Also My3 Suicide information given to patient at discharge.  Patient stated she did have the belongings which she brought to [facility].  Patient stated she appreciated all assistance received from [facility] staff.  All required discharge information given to patient at discharge.

## 2018-07-23 NOTE — Progress Notes (Signed)
  [facility] Adult Case Management Discharge Plan :  Will you be returning to the same living situation after discharge:  No. Patient going to [facility] At discharge, do you have transportation home?: Yes,  Bus, PART bus passes Do you have the ability to pay for your medications: Yes,  Medicaid  Release of information consent forms completed and in the chart;  Patient's signature needed at discharge.  Patient to Follow up at: Follow-up Information    [facility]. Go to.   Specialty:  Behavioral Health Why:  Please follow up for medication management and therapy during walk-in hours, Monday-Friday 9:00a.-3:00p.  Contact information: [address] [place] [address] [phone]       [facility] Department Follow up.   Contact information: [address] [place] [address] phone: [phone]           Next level of care provider has access to [facility] Link:no  Safety Planning and Suicide Prevention discussed: Yes,  attempted to reach mother twice, will complete SPE with patient  Have you used any form of tobacco in the last 30 days? (Cigarettes, Smokeless Tobacco, Cigars, and/or Pipes): Yes  Has patient been referred to the Quitline?: Patient refused referral  Patient has been referred for addiction treatment: Yes  Darreld Mclean, LCSWA 07/23/2018, 10:29 AM

## 2018-07-23 NOTE — Progress Notes (Signed)
D:  Patient's self inventory sheet, patient sleeps good, sleep medication helpful.  Good appetite, low energy level, good concentration.  Rated depression 3, hopeless and anxiety 5.  Denied withdrawals.  Denied SI.  Denied physical problems.   Goal is discharge.   Plans to make sure she has a place to go after discharge.  Does have discharge plans. A:  Medications administered per MD orders.  Emotional support and encouragement given patient. R:  Patient denied SI and HI, contracts for safety.  Denied A/V hallucinations.  Safety maintained with 15 minute checks.

## 2018-09-11 DIAGNOSIS — Z5900 Homelessness unspecified: Secondary | ICD-10-CM | POA: Insufficient documentation

## 2018-09-16 ENCOUNTER — Inpatient Hospital Stay (HOSPITAL_COMMUNITY)
Admission: EM | Admit: 2018-09-16 | Discharge: 2018-09-23 | DRG: 871 | Disposition: A | Discharge status: Home / Self Care | Payer: Medicaid Other | Attending: Internal Medicine | Admitting: Internal Medicine

## 2018-09-16 ENCOUNTER — Other Ambulatory Visit: Payer: Self-pay

## 2018-09-16 ENCOUNTER — Emergency Department (HOSPITAL_COMMUNITY): Payer: Medicaid Other

## 2018-09-16 DIAGNOSIS — F1320 Sedative, hypnotic or anxiolytic dependence, uncomplicated: Secondary | ICD-10-CM | POA: Diagnosis present

## 2018-09-16 DIAGNOSIS — F1920 Other psychoactive substance dependence, uncomplicated: Secondary | ICD-10-CM | POA: Diagnosis present

## 2018-09-16 DIAGNOSIS — I10 Essential (primary) hypertension: Secondary | ICD-10-CM | POA: Diagnosis present

## 2018-09-16 DIAGNOSIS — J440 Chronic obstructive pulmonary disease with acute lower respiratory infection: Secondary | ICD-10-CM | POA: Diagnosis present

## 2018-09-16 DIAGNOSIS — F1120 Opioid dependence, uncomplicated: Secondary | ICD-10-CM | POA: Diagnosis present

## 2018-09-16 DIAGNOSIS — G8929 Other chronic pain: Secondary | ICD-10-CM | POA: Diagnosis present

## 2018-09-16 DIAGNOSIS — G894 Chronic pain syndrome: Secondary | ICD-10-CM | POA: Diagnosis not present

## 2018-09-16 DIAGNOSIS — A419 Sepsis, unspecified organism: Secondary | ICD-10-CM | POA: Diagnosis present

## 2018-09-16 DIAGNOSIS — F1490 Cocaine use, unspecified, uncomplicated: Secondary | ICD-10-CM | POA: Diagnosis present

## 2018-09-16 DIAGNOSIS — J9601 Acute respiratory failure with hypoxia: Secondary | ICD-10-CM | POA: Diagnosis present

## 2018-09-16 DIAGNOSIS — Z886 Allergy status to analgesic agent status: Secondary | ICD-10-CM

## 2018-09-16 DIAGNOSIS — J189 Pneumonia, unspecified organism: Secondary | ICD-10-CM | POA: Diagnosis present

## 2018-09-16 DIAGNOSIS — E876 Hypokalemia: Secondary | ICD-10-CM | POA: Diagnosis present

## 2018-09-16 DIAGNOSIS — F3181 Bipolar II disorder: Secondary | ICD-10-CM | POA: Diagnosis present

## 2018-09-16 DIAGNOSIS — E871 Hypo-osmolality and hyponatremia: Secondary | ICD-10-CM | POA: Diagnosis present

## 2018-09-16 DIAGNOSIS — E669 Obesity, unspecified: Secondary | ICD-10-CM | POA: Diagnosis present

## 2018-09-16 DIAGNOSIS — F4310 Post-traumatic stress disorder, unspecified: Secondary | ICD-10-CM | POA: Diagnosis present

## 2018-09-16 DIAGNOSIS — Z9114 Patient's other noncompliance with medication regimen: Secondary | ICD-10-CM | POA: Diagnosis not present

## 2018-09-16 DIAGNOSIS — Z8249 Family history of ischemic heart disease and other diseases of the circulatory system: Secondary | ICD-10-CM | POA: Diagnosis not present

## 2018-09-16 DIAGNOSIS — Z79899 Other long term (current) drug therapy: Secondary | ICD-10-CM

## 2018-09-16 DIAGNOSIS — G4730 Sleep apnea, unspecified: Secondary | ICD-10-CM | POA: Diagnosis present

## 2018-09-16 DIAGNOSIS — I5032 Chronic diastolic (congestive) heart failure: Secondary | ICD-10-CM | POA: Diagnosis present

## 2018-09-16 DIAGNOSIS — I110 Hypertensive heart disease with heart failure: Secondary | ICD-10-CM | POA: Diagnosis present

## 2018-09-16 DIAGNOSIS — Z6841 Body Mass Index (BMI) 40.0 and over, adult: Secondary | ICD-10-CM

## 2018-09-16 DIAGNOSIS — Z888 Allergy status to other drugs, medicaments and biological substances status: Secondary | ICD-10-CM

## 2018-09-16 DIAGNOSIS — Z8614 Personal history of Methicillin resistant Staphylococcus aureus infection: Secondary | ICD-10-CM | POA: Diagnosis not present

## 2018-09-16 DIAGNOSIS — F17210 Nicotine dependence, cigarettes, uncomplicated: Secondary | ICD-10-CM | POA: Diagnosis present

## 2018-09-16 DIAGNOSIS — J181 Lobar pneumonia, unspecified organism: Secondary | ICD-10-CM

## 2018-09-16 DIAGNOSIS — Z590 Homelessness: Secondary | ICD-10-CM

## 2018-09-16 DIAGNOSIS — R0902 Hypoxemia: Secondary | ICD-10-CM

## 2018-09-16 LAB — CBC WITH DIFFERENTIAL/PLATELET
Abs Immature Granulocytes: 0.08 10*3/uL — ABNORMAL HIGH (ref 0.00–0.07)
Basophils Absolute: 0 10*3/uL (ref 0.0–0.1)
Basophils Relative: 0 %
Eosinophils Absolute: 0 10*3/uL (ref 0.0–0.5)
Eosinophils Relative: 0 %
HCT: 29.6 % — ABNORMAL LOW (ref 36.0–46.0)
Hemoglobin: 9.6 g/dL — ABNORMAL LOW (ref 12.0–15.0)
Immature Granulocytes: 1 %
Lymphocytes Relative: 14 %
Lymphs Abs: 1.8 10*3/uL (ref 0.7–4.0)
MCH: 32.7 pg (ref 26.0–34.0)
MCHC: 32.4 g/dL (ref 30.0–36.0)
MCV: 100.7 fL — ABNORMAL HIGH (ref 80.0–100.0)
Monocytes Absolute: 1.4 10*3/uL — ABNORMAL HIGH (ref 0.1–1.0)
Monocytes Relative: 11 %
Neutro Abs: 9.4 10*3/uL — ABNORMAL HIGH (ref 1.7–7.7)
Neutrophils Relative %: 74 %
Platelets: 196 10*3/uL (ref 150–400)
RBC: 2.94 MIL/uL — ABNORMAL LOW (ref 3.87–5.11)
RDW: 13.5 % (ref 11.5–15.5)
WBC: 12.6 10*3/uL — ABNORMAL HIGH (ref 4.0–10.5)
nRBC: 0 % (ref 0.0–0.2)

## 2018-09-16 LAB — BLOOD GAS, ARTERIAL
Acid-Base Excess: 0.7 mmol/L (ref 0.0–2.0)
Bicarbonate: 24.5 mmol/L (ref 20.0–28.0)
Drawn by: 546661
O2 Content: 4 L/min
O2 Saturation: 93.3 %
PO2 ART: 66.2 mmHg — AB (ref 83.0–108.0)
Patient temperature: 98.6
pCO2 arterial: 37.2 mmHg (ref 32.0–48.0)
pH, Arterial: 7.434 (ref 7.350–7.450)

## 2018-09-16 LAB — URINALYSIS, ROUTINE W REFLEX MICROSCOPIC
Bilirubin Urine: NEGATIVE
Glucose, UA: NEGATIVE mg/dL
Ketones, ur: NEGATIVE mg/dL
Nitrite: NEGATIVE
Protein, ur: NEGATIVE mg/dL
Specific Gravity, Urine: 1.013 (ref 1.005–1.030)
TRANS EPITHEL UA: 2
pH: 6 (ref 5.0–8.0)

## 2018-09-16 LAB — RAPID URINE DRUG SCREEN, HOSP PERFORMED
Amphetamines: NOT DETECTED
BENZODIAZEPINES: POSITIVE — AB
Barbiturates: NOT DETECTED
Cocaine: NOT DETECTED
Opiates: NOT DETECTED
Tetrahydrocannabinol: NOT DETECTED

## 2018-09-16 LAB — HEPATIC FUNCTION PANEL
ALT: 77 U/L — ABNORMAL HIGH (ref 0–44)
AST: 93 U/L — ABNORMAL HIGH (ref 15–41)
Albumin: 2.5 g/dL — ABNORMAL LOW (ref 3.5–5.0)
Alkaline Phosphatase: 56 U/L (ref 38–126)
BILIRUBIN TOTAL: 1.3 mg/dL — AB (ref 0.3–1.2)
Bilirubin, Direct: 0.6 mg/dL — ABNORMAL HIGH (ref 0.0–0.2)
Indirect Bilirubin: 0.7 mg/dL (ref 0.3–0.9)
Total Protein: 6.6 g/dL (ref 6.5–8.1)

## 2018-09-16 LAB — I-STAT TROPONIN, ED: TROPONIN I, POC: 0.05 ng/mL (ref 0.00–0.08)

## 2018-09-16 LAB — BASIC METABOLIC PANEL
Anion gap: 9 (ref 5–15)
BUN: 10 mg/dL (ref 6–20)
CO2: 23 mmol/L (ref 22–32)
Calcium: 8.2 mg/dL — ABNORMAL LOW (ref 8.9–10.3)
Chloride: 100 mmol/L (ref 98–111)
Creatinine, Ser: 1 mg/dL (ref 0.44–1.00)
GFR calc Af Amer: >60 mL/min (ref >60)
GFR calc non Af Amer: >60 mL/min (ref >60)
Glucose, Bld: 146 mg/dL — ABNORMAL HIGH (ref 70–99)
Potassium: 3.3 mmol/L — ABNORMAL LOW (ref 3.5–5.1)
Sodium: 132 mmol/L — ABNORMAL LOW (ref 135–145)

## 2018-09-16 LAB — LACTIC ACID, PLASMA
Lactic Acid, Venous: 1.8 mmol/L (ref 0.5–1.9)
Lactic Acid, Venous: 1.4 mmol/L (ref 0.5–1.9)

## 2018-09-16 LAB — SAMPLE TO BLOOD BANK

## 2018-09-16 LAB — INFLUENZA PANEL BY PCR (TYPE A & B)
Influenza A By PCR: NEGATIVE
Influenza B By PCR: NEGATIVE

## 2018-09-16 LAB — BRAIN NATRIURETIC PEPTIDE: B Natriuretic Peptide: 630.2 pg/mL — ABNORMAL HIGH (ref 0.0–100.0)

## 2018-09-16 MED ORDER — ONDANSETRON HCL 4 MG/2ML IJ SOLN: 4.0000 mg | Freq: Four times a day (QID) | INTRAMUSCULAR | Status: DC | PRN

## 2018-09-16 MED ORDER — SODIUM CHLORIDE 0.9 % IV SOLN
500.0000 mg | Freq: Once | INTRAVENOUS | Status: AC
  Administered 2018-09-16: 500 mg via INTRAVENOUS
  Filled 2018-09-16: qty 500

## 2018-09-16 MED ORDER — SODIUM CHLORIDE 0.9 % IV SOLN
INTRAVENOUS | Status: AC
  Administered 2018-09-16: 19:00:00 via INTRAVENOUS

## 2018-09-16 MED ORDER — VANCOMYCIN HCL 10 G IV SOLR
2000.0000 mg | Freq: Two times a day (BID) | INTRAVENOUS | Status: DC
  Administered 2018-09-17: 2000 mg via INTRAVENOUS
  Filled 2018-09-16: qty 2000

## 2018-09-16 MED ORDER — TRAZODONE HCL 50 MG PO TABS: 25.0000 mg | ORAL_TABLET | Freq: Every evening | ORAL | Status: DC | PRN

## 2018-09-16 MED ORDER — ONDANSETRON HCL 4 MG PO TABS: 4.0000 mg | ORAL_TABLET | Freq: Four times a day (QID) | ORAL | Status: DC | PRN

## 2018-09-16 MED ORDER — VANCOMYCIN HCL IN DEXTROSE 1-5 GM/200ML-% IV SOLN: 1000.0000 mg | Freq: Once | INTRAVENOUS | Status: DC

## 2018-09-16 MED ORDER — SODIUM CHLORIDE 0.9 % IV SOLN
2.0000 g | Freq: Once | INTRAVENOUS | Status: AC
  Administered 2018-09-16: 2 g via INTRAVENOUS
  Filled 2018-09-16: qty 2

## 2018-09-16 MED ORDER — SODIUM CHLORIDE 0.9 % IV SOLN
1.0000 g | Freq: Three times a day (TID) | INTRAVENOUS | Status: DC
  Administered 2018-09-17 – 2018-09-21 (×13): 1 g via INTRAVENOUS
  Filled 2018-09-16 (×16): qty 1

## 2018-09-16 MED ORDER — NICOTINE 21 MG/24HR TD PT24
21.0000 mg | MEDICATED_PATCH | Freq: Every day | TRANSDERMAL | Status: DC
  Administered 2018-09-16 – 2018-09-23 (×8): 21 mg via TRANSDERMAL
  Filled 2018-09-16 (×8): qty 1

## 2018-09-16 MED ORDER — BUPRENORPHINE HCL-NALOXONE HCL 8-2 MG SL SUBL
1.0000 | SUBLINGUAL_TABLET | Freq: Two times a day (BID) | SUBLINGUAL | Status: DC
  Administered 2018-09-17 – 2018-09-23 (×14): 1 via SUBLINGUAL
  Filled 2018-09-16 (×14): qty 1

## 2018-09-16 MED ORDER — IPRATROPIUM-ALBUTEROL 0.5-2.5 (3) MG/3ML IN SOLN
3.0000 mL | Freq: Once | RESPIRATORY_TRACT | Status: AC
  Administered 2018-09-16: 3 mL via RESPIRATORY_TRACT
  Filled 2018-09-16: qty 3

## 2018-09-16 MED ORDER — ACETAMINOPHEN 650 MG RE SUPP: 650.0000 mg | Freq: Four times a day (QID) | RECTAL | Status: DC | PRN

## 2018-09-16 MED ORDER — VENLAFAXINE HCL ER 150 MG PO CP24
150.0000 mg | ORAL_CAPSULE | Freq: Every day | ORAL | Status: DC
  Administered 2018-09-17 – 2018-09-23 (×7): 150 mg via ORAL
  Filled 2018-09-16 (×2): qty 2
  Filled 2018-09-16: qty 1
  Filled 2018-09-16: qty 2
  Filled 2018-09-16 (×2): qty 1
  Filled 2018-09-16 (×2): qty 2
  Filled 2018-09-16 (×2): qty 1
  Filled 2018-09-16 (×2): qty 2
  Filled 2018-09-16 (×2): qty 1

## 2018-09-16 MED ORDER — HEPARIN SODIUM (PORCINE) 5000 UNIT/ML IJ SOLN
5000.0000 [IU] | Freq: Three times a day (TID) | INTRAMUSCULAR | Status: DC
  Administered 2018-09-16 – 2018-09-23 (×19): 5000 [IU] via SUBCUTANEOUS
  Filled 2018-09-16 (×20): qty 1

## 2018-09-16 MED ORDER — POLYETHYLENE GLYCOL 3350 17 G PO PACK: 17.0000 g | PACK | Freq: Every day | ORAL | Status: DC | PRN

## 2018-09-16 MED ORDER — VANCOMYCIN HCL 10 G IV SOLR
2000.0000 mg | Freq: Once | INTRAVENOUS | Status: AC
  Administered 2018-09-16: 2000 mg via INTRAVENOUS
  Filled 2018-09-16: qty 2000

## 2018-09-16 MED ORDER — SODIUM CHLORIDE 0.9 % IV BOLUS: 500.0000 mL | Freq: Once | INTRAVENOUS | Status: DC

## 2018-09-16 MED ORDER — BUPRENORPHINE HCL-NALOXONE HCL 8-2 MG SL FILM: 1.0000 | ORAL_FILM | Freq: Two times a day (BID) | SUBLINGUAL | Status: DC

## 2018-09-16 MED ORDER — SODIUM CHLORIDE 0.9 % IV BOLUS
500.0000 mL | Freq: Once | INTRAVENOUS | Status: AC
  Administered 2018-09-16: 500 mL via INTRAVENOUS

## 2018-09-16 MED ORDER — TRAZODONE HCL 50 MG PO TABS
50.0000 mg | ORAL_TABLET | Freq: Every day | ORAL | Status: DC
  Administered 2018-09-17 – 2018-09-22 (×6): 50 mg via ORAL
  Filled 2018-09-16 (×6): qty 1

## 2018-09-16 MED ORDER — SODIUM CHLORIDE 0.9 % IV SOLN
1.0000 g | Freq: Once | INTRAVENOUS | Status: AC
  Administered 2018-09-16: 1 g via INTRAVENOUS
  Filled 2018-09-16: qty 10

## 2018-09-16 MED ORDER — LACTATED RINGERS IV BOLUS: 500.0000 mL | Freq: Once | INTRAVENOUS | Status: DC

## 2018-09-16 MED ORDER — LURASIDONE HCL 20 MG PO TABS
20.0000 mg | ORAL_TABLET | Freq: Every day | ORAL | Status: DC
  Administered 2018-09-17 – 2018-09-22 (×6): 20 mg via ORAL
  Filled 2018-09-16 (×7): qty 1

## 2018-09-16 MED ORDER — ACETAMINOPHEN 325 MG PO TABS: 650.0000 mg | ORAL_TABLET | Freq: Four times a day (QID) | ORAL | Status: DC | PRN

## 2018-09-16 MED ORDER — IBUPROFEN 400 MG PO TABS
600.0000 mg | ORAL_TABLET | Freq: Four times a day (QID) | ORAL | Status: DC | PRN
  Administered 2018-09-17: 600 mg via ORAL
  Filled 2018-09-16 (×2): qty 1

## 2018-09-16 NOTE — ED Provider Notes (Signed)
Medical screening examination/treatment/procedure(s) were conducted as a shared visit with non-physician practitioner(s) and myself.  I personally evaluated the patient during the encounter. Briefly, the patient is a 43 y.o. female with history of hypertension, sleep apnea, CKD who presents the ED with cough, sputum production, shortness of breath.  Patient found to be hypoxic upon arrival in the 80s.  She was started on 4 L of oxygen and improved.  Patient had low-grade temperature of 99.4.  Patient with decreased breath sounds throughout.  She has trace edema in her legs.  Patient had recent admission at [facility] with extensive work-up including echocardiogram, ultrasound of her liver and abdomen which showed no ascites or no reason for anasarca.  Echocardiogram was overall unremarkable.  She was discharged on Lasix but has not taken any.  She appears to have new hypoxia which is likely from possible pneumonia, possibly from volume.  Patient does not have any particular DVT or PE risk factors.  More likely cause for hypoxia at this time. Will initiate infectious work-up with blood cultures, chest x-ray, lactic acid.  Patient to be given 500 cc bolus given concern for possible volume overload.  Patient with chest x-ray that shows bilateral lower lobe pneumonia.  Lactic acid within normal limits.  No significant electrolyte abnormality, kidney injury.  Troponin within normal limits.  CBC needs to be redrawn as appears to have been diluted.  Patient to be admitted to hospitalist service for further infectious work-up and care.  Patient started on IV Rocephin and a Zithromax.  Hemodynamically stable throughout my care.  Stable on 4 L of oxygen. Awaiting CBC, EKG at time of admission.   .Critical Care Performed by: Virgina Norfolk, DO Authorized by: Virgina Norfolk, DO   Critical care provider statement:    Critical care time (minutes):  30   Critical care time was exclusive of:  Separately billable  procedures and treating other patients and teaching time   Critical care was necessary to treat or prevent imminent or life-threatening deterioration of the following conditions:  Respiratory failure and sepsis   Critical care was time spent personally by me on the following activities:  Development of treatment plan with patient or surrogate, evaluation of patient's response to treatment, examination of patient, obtaining history from patient or surrogate, ordering and performing treatments and interventions, ordering and review of laboratory studies, ordering and review of radiographic studies, pulse oximetry, re-evaluation of patient's condition and review of old charts   I assumed direction of critical care for this patient from another provider in my specialty: no        EKG Interpretation None           Virgina Norfolk, DO 09/16/18 1856

## 2018-09-16 NOTE — Progress Notes (Signed)
Spoke with Dr Willette Pa regarding new admit.  Voiced concerns of her being very drowsy and BP in the 80's.  Stat ABG ordered, UA/Drug screen obtained, NS bolus being given.

## 2018-09-16 NOTE — ED Notes (Signed)
Report called to debbie rn on 2w

## 2018-09-16 NOTE — ED Triage Notes (Signed)
Pt arrives for SOB x2 days, hx COPD with recent admission. Son was killed on Monday, left [facility] with medications behind and has not been taking prescribed lasix. Increased anxiety since son was killed.

## 2018-09-16 NOTE — ED Notes (Signed)
BLOOD REDRAWN  FOR CBC  REPORTED FROM LAB  2.7 HGB

## 2018-09-16 NOTE — Progress Notes (Signed)
Pharmacy Antibiotic Note  Judy Robles is a 43 y.o. female admitted on 09/16/2018 with sepsis.  Pharmacy has been consulted for Vancomycin and Cefepime dosing.  Plan: Vancomycin 2000 mg IV q12hr Cefepime 2 gms x 1, then 1 gm IV q8hr Monitor renal function, C&S, and vanc levels as appropriate  Height: 5\' 3"  (160 cm) Weight: 289 lb (131.1 kg) IBW/kg (Calculated) : 52.4  Temp (24hrs), Avg:99.8 F (37.7 C), Min:99.4 F (37.4 C), Max:100.1 F (37.8 C)  Recent Labs  Lab 09/16/18 1535 09/16/18 1636  WBC  --  12.6*  CREATININE 1.00  --   LATICACIDVEN 1.8  --     Estimated Creatinine Clearance: 95.1 mL/min (by C-G formula based on SCr of 1 mg/dL).    Allergies  Allergen Reactions  . Tylenol [Acetaminophen] Itching and Other (See Comments)    Welts  . Lisinopril Other (See Comments)    Shuts down kidneys    Antimicrobials this admission: Vanc 1/26 >>  Cefepime 1/26 >>   Thank you for allowing pharmacy to be a part of this patient's care.  Jeanella Caraathy Lucciana Head, PharmD, [facility] Clinical Pharmacist Please see AMION for all Pharmacists' Contact Phone Numbers 09/16/2018, 5:46 PM

## 2018-09-16 NOTE — ED Notes (Signed)
Admitting doctor at  The bedside.  The pt has no hx of anemia  But she has had dark colored stools for awhile she does not live here she lives in [place]  She is here in gb because her son just died Dec 21, 2022

## 2018-09-16 NOTE — ED Notes (Signed)
Placed purewick on pt, tolerated well. Explained procedure to patient and she agree and understood.

## 2018-09-16 NOTE — ED Notes (Signed)
RESP THERAPY TOO BUSY AT PRESENT TO DERAW ARTERIAL BLOOD GAS

## 2018-09-16 NOTE — ED Provider Notes (Signed)
[facility] EMERGENCY DEPARTMENT Provider Note   CSN: [id number] Arrival date & time: 09/16/18  1426     History   Chief Complaint No chief complaint on file.   HPI Judy Robles is a 43 y.o. female today for 2 days of shortness of breath.  Patient relates recently been at [facility] for COPD exacerbation, CHF and had 2-day stay.  Patient states that she learned that her son died early last week and has been distraught since that time.  She states that she left the hospital without any of her medications which has led to her symptoms today.  Patient states that she has had 2 days of increasing shortness of breath, generalized body aches without focal area of pain as well as fever.  Patient also endorses increased cough with yellow/green sputum production.  Patient denies chest pain abdominal pain, nausea/vomiting, diarrhea, new extremity swelling/pain.  HPI  Past Medical History:  Diagnosis Date  . Arthritis   . Bipolar 1 disorder (HCC)   . Depression   . Hypercholesterolemia   . Hypertension   . MRSA (methicillin resistant Staphylococcus aureus)   . Obesity   . Renal disease   . Sleep apnea     Patient Active Problem List   Diagnosis Date Noted  . Severe benzodiazepine use disorder (HCC) 07/10/2018  . Bipolar II disorder (HCC)   . Severe recurrent major depression without psychotic features (HCC) 06/05/2018  . PTSD (post-traumatic stress disorder) 11/21/2017  . Major depressive disorder, recurrent episode, moderate (HCC) 11/05/2017  . Community acquired pneumonia of right lower lobe of lung (HCC) 09/02/2017  . Sepsis associated hypotension (HCC) 09/02/2017  . AKI (acute kidney injury) (HCC) 09/02/2017  . Nausea and vomiting 09/02/2017  . Drug overdose 04/22/2017  . Chest pain 10/09/2016  . Pressure injury of skin 10/05/2016  . Polysubstance dependence including opioid type drug with complication, episodic abuse (HCC) 07/21/2016  . MDD (major  depressive disorder), recurrent severe, without psychosis (HCC) 07/21/2016  . Chronic pain 07/03/2015  . Diarrhea 05/01/2014  . Essential hypertension, benign 04/17/2014    Past Surgical History:  Procedure Laterality Date  . CESAREAN SECTION    . FRACTURE SURGERY Right    ankle  . I&D EXTREMITY Right 04/16/2014   Procedure: IRRIGATION AND DEBRIDEMENT EXTREMITY;  Surgeon: Sharma Covert, MD;  Location: [facility] OR;  Service: Orthopedics;  Laterality: Right;     OB History   No obstetric history on file.      Home Medications    Prior to Admission medications   Medication Sig Start Date End Date Taking? Authorizing Provider  Buprenorphine HCl-Naloxone HCl 8-2 MG FILM Place 1 Film under the tongue 2 (two) times daily.   Yes [provider]  gabapentin (NEURONTIN) 300 MG capsule Take 2 capsules (600 mg total) by mouth 3 (three) times daily. For Agitation 07/23/18  Yes Armandina Stammer I, NP  lurasidone (LATUDA) 20 MG TABS tablet Take 1 tablet (20 mg total) by mouth daily with supper. For mood control 07/23/18  Yes Nwoko, Nicole Kindred I, NP  nicotine (NICODERM CQ - DOSED IN MG/24 HOURS) 21 mg/24hr patch Place 1 patch (21 mg total) onto the skin daily. (May purchase from over the counter): For smoking cessation 07/24/18  Yes Armandina Stammer I, NP  traZODone (DESYREL) 50 MG tablet Take 1 tablet (50 mg total) by mouth at bedtime as needed for sleep. Patient taking differently: Take 50 mg by mouth at bedtime.  07/23/18  Yes  Armandina Stammer I, NP  venlafaxine XR (EFFEXOR-XR) 150 MG 24 hr capsule Take 1 capsule (150 mg total) by mouth daily with breakfast. For depression 07/24/18  Yes Nwoko, Nelda Marseille, NP    Family History Family History  Problem Relation Age of Onset  . Throat cancer Mother   . Hypertension Father   . Stroke Father     Social History Social History   Tobacco Use  . Smoking status: Current Every Day Smoker    Packs/day: 1.00    Types: Cigarettes  . Smokeless tobacco: Never Used  .  Tobacco comment: 1/2PPD  Substance Use Topics  . Alcohol use: Yes    Comment: rare, occasional  . Drug use: Yes    Types: Cocaine    Comment: occasional cocaine use     Allergies   Tylenol [acetaminophen] and Lisinopril   Review of Systems Review of Systems  Constitutional: Positive for fever. Negative for chills.  Eyes: Negative.  Negative for visual disturbance.  Respiratory: Positive for cough and shortness of breath.   Cardiovascular: Negative.  Negative for chest pain.  Gastrointestinal: Negative.  Negative for abdominal pain, diarrhea, nausea and vomiting.  Genitourinary: Negative for dysuria.  Musculoskeletal: Negative.  Negative for arthralgias and myalgias.  Neurological: Negative.  Negative for dizziness, weakness and headaches.  All other systems reviewed and are negative.  Physical Exam Updated Vital Signs BP (!) 108/95   Pulse 78   Temp 100.1 F (37.8 C) (Rectal)   Resp 19   Ht 5\' 3"  (1.6 m)   Wt 131.1 kg   SpO2 94%   BMI 51.19 kg/m   Physical Exam Constitutional:      General: She is not in acute distress.    Appearance: She is well-developed. She is obese. She is ill-appearing. She is not diaphoretic.  HENT:     Head: Normocephalic and atraumatic.     Right Ear: External ear normal.     Left Ear: External ear normal.     Nose: Nose normal.     Mouth/Throat:     Lips: Pink.     Mouth: Mucous membranes are moist.     Pharynx: Oropharynx is clear. Uvula midline.  Eyes:     General: Vision grossly intact. Gaze aligned appropriately.     Extraocular Movements: Extraocular movements intact.     Conjunctiva/sclera: Conjunctivae normal.     Pupils: Pupils are equal, round, and reactive to light.  Neck:     Musculoskeletal: Full passive range of motion without pain, normal range of motion and neck supple.     Trachea: Trachea and phonation normal. No tracheal deviation.  Cardiovascular:     Rate and Rhythm: Regular rhythm. Tachycardia present.      Pulses:          Dorsalis pedis pulses are 2+ on the right side and 2+ on the left side.       Posterior tibial pulses are 2+ on the right side and 2+ on the left side.     Heart sounds: Normal heart sounds.  Pulmonary:     Effort: Pulmonary effort is normal. No respiratory distress.     Breath sounds: Normal air entry. Wheezing and rhonchi present.  Chest:     Chest wall: No deformity, tenderness or crepitus.  Abdominal:     General: Bowel sounds are normal.     Palpations: Abdomen is soft.     Tenderness: There is no abdominal tenderness. There is no guarding or rebound.  Musculoskeletal: Normal range of motion.     Right lower leg: Normal. She exhibits no tenderness and no swelling.     Left lower leg: Normal. She exhibits no tenderness and no swelling.  Feet:     Right foot:     Protective Sensation: 3 sites tested. 3 sites sensed.     Left foot:     Protective Sensation: 3 sites tested. 3 sites sensed.  Skin:    General: Skin is warm and dry.  Neurological:     Mental Status: She is alert.     GCS: GCS eye subscore is 4. GCS verbal subscore is 5. GCS motor subscore is 6.     Comments: Speech is clear and goal oriented, follows commands Major Cranial nerves without deficit, no facial droop Normal strength in upper and lower extremities bilaterally including dorsiflexion and plantar flexion, strong and equal grip strength Sensation normal to light touch Moves extremities without ataxia, coordination intact  Psychiatric:        Behavior: Behavior normal.    ED Treatments / Results  Labs (all labs ordered are listed, but only abnormal results are displayed) Labs Reviewed  BASIC METABOLIC PANEL - Abnormal; Notable for the following components:      Result Value   Sodium 132 (*)    Potassium 3.3 (*)    Glucose, Bld 146 (*)    Calcium 8.2 (*)    All other components within normal limits  CULTURE, BLOOD (ROUTINE X 2)  CULTURE, BLOOD (ROUTINE X 2)  URINE CULTURE  LACTIC  ACID, PLASMA  CBC WITH DIFFERENTIAL/PLATELET  LACTIC ACID, PLASMA  HEPATIC FUNCTION PANEL  INFLUENZA PANEL BY PCR (TYPE A & B)  RAPID URINE DRUG SCREEN, HOSP PERFORMED  URINALYSIS, ROUTINE W REFLEX MICROSCOPIC  CBC WITH DIFFERENTIAL/PLATELET  I-STAT TROPONIN, ED  SAMPLE TO BLOOD BANK    EKG None  Radiology Dg Chest Portable 1 View  Result Date: 09/16/2018 CLINICAL DATA:  Shortness of breath over the last 2 days. EXAM: PORTABLE CHEST 1 VIEW COMPARISON:  07/07/2018 FINDINGS: Artifact overlies the chest. There is airspace filling in both lower lobes, more extensive on the left than the right, consistent with bilateral lower lobe pneumonia. Upper lobes are clear. No visible effusion. No acute bone finding. IMPRESSION: Bilateral lower lobe pneumonia, left worse than right. Electronically Signed   By: Paulina FusiMark  Shogry M.D.   On: 09/16/2018 14:59    Procedures Procedures (including critical care time)  Medications Ordered in ED Medications  azithromycin (ZITHROMAX) 500 mg in sodium chloride 0.9 % 250 mL IVPB (500 mg Intravenous New Bag/Given 09/16/18 1642)  sodium chloride 0.9 % bolus 500 mL (500 mLs Intravenous New Bag/Given 09/16/18 1628)  sodium chloride 0.9 % bolus 500 mL (has no administration in time range)  ipratropium-albuterol (DUONEB) 0.5-2.5 (3) MG/3ML nebulizer solution 3 mL (3 mLs Nebulization Given 09/16/18 1628)  cefTRIAXone (ROCEPHIN) 1 g in sodium chloride 0.9 % 100 mL IVPB (0 g Intravenous Stopped 09/16/18 1640)     Initial Impression / Assessment and Plan / ED Course  I have reviewed the triage vital signs and the nursing notes.  Pertinent labs & imaging results that were available during my care of the patient were reviewed by me and considered in my medical decision making (see chart for details).  43 year old female with multiple comorbid conditions presenting today for 2-day history of shortness of breath after medication noncompliance.  Patient hypoxic on room air in  the mid 80s, mildly tachypneic.  She is endorsing generalized pain without focus.  Denies chest pain.  Patient has been placed on submental O2 by EMS with improvement of O2 saturations.  Patient seen and evaluate with Dr. Lockie Molauratolo.  Patient is code sepsis, respiratory antibiotics ordered.  Chest x-ray shows bilateral pneumonia Lactic 1.8 Troponin negative BMP nonacute Lab delay, blood work being redrawn. ----------------- Discussed case with hospitalist who will see patient for admission here today.  Patient reassessed, resting comfortably and NAD. VSS.  Note: Portions of this report may have been transcribed using voice recognition software. Every effort was made to ensure accuracy; however, inadvertent computerized transcription errors may still be present. Final Clinical Impressions(s) / ED Diagnoses   Final diagnoses:  Sepsis, due to unspecified organism, unspecified whether acute organ dysfunction present [facility](HCC)  Pneumonia of both lower lobes due to infectious organism [facility](HCC)    ED Discharge Orders    None       Elizabeth PalauMorelli, Arda Daggs A, PA-C 09/16/18 1656    Virgina Norfolkuratolo, Adam, DO 09/16/18 1857

## 2018-09-16 NOTE — ED Notes (Signed)
lactric acid not drawn now because it is not timed right

## 2018-09-16 NOTE — H&P (Signed)
History and Physical    Judy Robles:[id number] DOB: 01/07/1976 DOA: 09/16/2018  PCP: Jearld Lesch, MD  Patient coming from: Her ex-husband's house  I have personally briefly reviewed patient's old medical records in [facility]  Chief Complaint: Shortness of breath  HPI: Judy Robles is a 43 y.o. female with medical history significant of diastolic congestive heart failure with EF of 60 to 65% with recent admission to [facility] discharged January 22, COPD, BiPolar disorder, depression, hypertension, [place] substance abuse and benzodiazepine addiction who presents the emergency department complaining of worsening shortness of breath over the past 2 days.  Her son died early last week and she has been distraught since then.  She came to [place] from the [place] area where she was living in a shelter out any of her medications.  She left her non-controlled substances with a friend.  She was able to get her Suboxone filled here in [place].  She has had 2 days of increasing shortness of breath and generalized body aches without focal area of pain.  She has had associated fever.  She thinks that she is having a COPD exacerbation.  Had increasing cough with yellow-green sputum production.  Has had no chest pain, no abdominal pain, no nausea or vomiting, no diarrhea, no lower extremity edema, no abdominal swelling or pain. Records from [facility] reports that the fit patient states called by Dr. Trina Ao.  Records stated she had gotten her Suboxone filled.  During that call she had requested benzodiazepine and he recommended against them due to her history.  ED Course: Patient hypoxemic with sats down to 85% on room air, blood pressure initially low at 98/44 required fluid bolus, chest x-ray shows bilateral lower lobe pneumonia left greater than right.  Hyponatremia with a sodium of 132, hypokalemia with potassium 3.3  Review of Systems: As per HPI  otherwise all other systems reviewed and  negative.    Past Medical History:  Diagnosis Date  . Arthritis   . Bipolar 1 disorder (HCC)   . Depression   . Hypercholesterolemia   . Hypertension   . MRSA (methicillin resistant Staphylococcus aureus)   . Obesity   . Renal disease   . Sleep apnea     Past Surgical History:  Procedure Laterality Date  . CESAREAN SECTION    . FRACTURE SURGERY Right    ankle  . I&D EXTREMITY Right 04/16/2014   Procedure: IRRIGATION AND DEBRIDEMENT EXTREMITY;  Surgeon: Sharma Covert, MD;  Location: [facility] OR;  Service: Orthopedics;  Laterality: Right;    Social History   Social History Narrative  . Not on file     reports that she has been smoking cigarettes. She has been smoking about 1.00 pack per day. She has never used smokeless tobacco. She reports current alcohol use. She reports current drug use. Drug: Cocaine.  Allergies  Allergen Reactions  . Tylenol [Acetaminophen] Itching and Other (See Comments)    Welts  . Lisinopril Other (See Comments)    Shuts down kidneys    Family History  Problem Relation Age of Onset  . Throat cancer Mother   . Hypertension Father   . Stroke Father      Prior to Admission medications   Medication Sig Start Date End Date Taking? Authorizing Provider  Buprenorphine HCl-Naloxone HCl 8-2 MG FILM Place 1 Film under the tongue 2 (two) times daily.   Yes [provider]  gabapentin (NEURONTIN) 300  MG capsule Take 2 capsules (600 mg total) by mouth 3 (three) times daily. For Agitation 07/23/18  Yes Armandina StammerNwoko, Agnes I, NP  lurasidone (LATUDA) 20 MG TABS tablet Take 1 tablet (20 mg total) by mouth daily with supper. For mood control 07/23/18  Yes Nwoko, Nicole KindredAgnes I, NP  nicotine (NICODERM CQ - DOSED IN MG/24 HOURS) 21 mg/24hr patch Place 1 patch (21 mg total) onto the skin daily. (May purchase from over the counter): For smoking cessation 07/24/18  Yes Armandina StammerNwoko, Agnes I, NP  traZODone (DESYREL) 50 MG tablet Take 1  tablet (50 mg total) by mouth at bedtime as needed for sleep. Patient taking differently: Take 50 mg by mouth at bedtime.  07/23/18  Yes Armandina StammerNwoko, Agnes I, NP  venlafaxine XR (EFFEXOR-XR) 150 MG 24 hr capsule Take 1 capsule (150 mg total) by mouth daily with breakfast. For depression 07/24/18  Yes Sanjuana KavaNwoko, Agnes I, NP    Physical Exam:  Constitutional: Somnolent, anxious, creased respiratory effort without accessory muscle use Vitals:   09/16/18 1515 09/16/18 1530 09/16/18 1615 09/16/18 1649  BP: (!) 103/50 (!) 114/57 (!) 108/95   Pulse: 85  78   Resp: (!) 30 19 19    Temp:    100.1 F (37.8 C)  TempSrc:    Rectal  SpO2: 91%  94%   Weight:      Height:       Eyes: PERRL, lids and conjunctivae normal ENMT: Mucous membranes are moist. Posterior pharynx clear of any exudate or lesions.Normal dentition.  Neck: normal, supple, no masses, no thyromegaly Respiratory: Rhonchi throughout without any wheezing or crackles and increased respiratory effort no accessory muscle use.  Cardiovascular: Regular rate and rhythm, no murmurs / rubs / gallops. No extremity edema. 2+ pedal pulses. No carotid bruits.  Abdomen: no tenderness, no masses palpated. No hepatosplenomegaly. Bowel sounds positive.  Musculoskeletal: no clubbing / cyanosis. No joint deformity upper and lower extremities. Good ROM, no contractures. Normal muscle tone.  Skin: no rashes, lesions, ulcers. No induration Neurologic: CN 2-12 grossly intact. Sensation intact, DTR normal. Strength 5/5 in all 4.  Psychiatric: Normal judgment and insight. Alert and oriented x 3. Normal mood.     Labs on Admission: I have personally reviewed following labs and imaging studies  CBC: Recent Labs  Lab 09/16/18 1636  WBC 12.6*  NEUTROABS 9.4*  HGB 9.6*  HCT 29.6*  MCV 100.7*  PLT 196   Basic Metabolic Panel: Recent Labs  Lab 09/16/18 1535  NA 132*  K 3.3*  CL 100  CO2 23  GLUCOSE 146*  BUN 10  CREATININE 1.00  CALCIUM 8.2*    GFR: Estimated Creatinine Clearance: 95.1 mL/min (by C-G formula based on SCr of 1 mg/dL). Liver Function Tests: No results for input(s): AST, ALT, ALKPHOS, BILITOT, PROT, ALBUMIN in the last 168 hours. No results for input(s): LIPASE, AMYLASE in the last 168 hours. No results for input(s): AMMONIA in the last 168 hours. Coagulation Profile: No results for input(s): INR, PROTIME in the last 168 hours. Cardiac Enzymes: No results for input(s): CKTOTAL, CKMB, CKMBINDEX, TROPONINI in the last 168 hours. BNP (last 3 results) No results for input(s): PROBNP in the last 8760 hours. HbA1C: No results for input(s): HGBA1C in the last 72 hours. CBG: No results for input(s): GLUCAP in the last 168 hours. Lipid Profile: No results for input(s): CHOL, HDL, LDLCALC, TRIG, CHOLHDL, LDLDIRECT in the last 72 hours. Thyroid Function Tests: No results for input(s): TSH, T4TOTAL, FREET4, T3FREE, THYROIDAB  in the last 72 hours. Anemia Panel: No results for input(s): VITAMINB12, FOLATE, FERRITIN, TIBC, IRON, RETICCTPCT in the last 72 hours. Urine analysis:    Component Value Date/Time   COLORURINE YELLOW 09/02/2017 2034   APPEARANCEUR CLEAR 09/02/2017 2034   LABSPEC 1.005 09/02/2017 2034   PHURINE 5.0 09/02/2017 2034   GLUCOSEU NEGATIVE 09/02/2017 2034   HGBUR NEGATIVE 09/02/2017 2034   BILIRUBINUR NEGATIVE 09/02/2017 2034   KETONESUR NEGATIVE 09/02/2017 2034   PROTEINUR NEGATIVE 09/02/2017 2034   UROBILINOGEN 0.2 07/03/2015 0618   NITRITE NEGATIVE 09/02/2017 2034   LEUKOCYTESUR NEGATIVE 09/02/2017 2034    Radiological Exams on Admission: Dg Chest Portable 1 View  Result Date: 09/16/2018 CLINICAL DATA:  Shortness of breath over the last 2 days. EXAM: PORTABLE CHEST 1 VIEW COMPARISON:  07/07/2018 FINDINGS: Artifact overlies the chest. There is airspace filling in both lower lobes, more extensive on the left than the right, consistent with bilateral lower lobe pneumonia. Upper lobes are clear.  No visible effusion. No acute bone finding. IMPRESSION: Bilateral lower lobe pneumonia, left worse than right. Electronically Signed   By: Paulina Fusi M.D.   On: 09/16/2018 14:59      Assessment/Plan Principal Problem:   Sepsis (HCC) Active Problems:   HCAP (healthcare-associated pneumonia)   LLL pneumonia (HCC)   Chronic pain   Bipolar II disorder (HCC)   Severe benzodiazepine use disorder (HCC)   Hyponatremia   Hypokalemia   Primary hypertension   Polysubstance dependence including opioid type drug with complication, episodic abuse (HCC)    1.  Sepsis: Patient meets criteria for sepsis based on blood pressure as well as low-grade fever and evaded white blood cell count.  She received some IV fluids in the emergency department judiciously given history of congestive heart failure.  Her BNP is elevated at 630.  Antibiotics were started for acquired pneumonia in the emergency department given recent hospital stay we will switch her to hospital-acquired infection antibiotics.  2.  Left lower lobe pneumonia likely healthcare associated: Antibiotics with vancomycin and cefepime.  3.  Chronic pain: We will continue her home doses of Suboxone.  Creatinine is elevated and therefore nonsteroidals would be complicated to give.  Patient has an allergy to Tylenol.  4.  Bipolar disorder 2: Continue her venlafaxine.  5.  Severe benzodiazepine use disorder: Noted we will not give any benzos this admission.  Please note there were some benzos in her urine not sure if these were related to her previous hospitalization.  6.  Hyponatremia: Likely related to pneumonia and will improve with IV fluids given gently.  7.  Hypokalemia: We will replete orally.  8.  Primary hypertension continue home medications.  9.  Polysubstance dependence including opioid type drug with complication and episodic abuse: Noted continue Suboxone.  10.  Chronic diastolic congestive heart failure: Noted appears to be  compensated at this point.  B NP is elevated but the patient has no physical exam findings consistent with CHF   DVT prophylaxis: Subcu heparin Code Status: Full code Family Communication: No family present at the time of admission.  Patient retains capacity. Disposition Plan: Complicated as patient lives in [place] but has been staying at a shelter.  Unsure what disposition plan will be.  She will likely discharge home in the next 2 to 3 days.  Social work has been consulted. Consults called: None Admission status: Inpatient   Lahoma Crocker MD FACP [facility] Pager [phone]  If 7PM-7AM, please contact night-coverage [email or web address]  Password [email or web address]  09/16/2018, 5:38 PM

## 2018-09-17 ENCOUNTER — Inpatient Hospital Stay (HOSPITAL_COMMUNITY): Payer: Medicaid Other

## 2018-09-17 DIAGNOSIS — J189 Pneumonia, unspecified organism: Secondary | ICD-10-CM

## 2018-09-17 DIAGNOSIS — F3181 Bipolar II disorder: Secondary | ICD-10-CM

## 2018-09-17 DIAGNOSIS — E871 Hypo-osmolality and hyponatremia: Secondary | ICD-10-CM

## 2018-09-17 DIAGNOSIS — G894 Chronic pain syndrome: Secondary | ICD-10-CM

## 2018-09-17 LAB — COMPREHENSIVE METABOLIC PANEL
ALT: 80 U/L — ABNORMAL HIGH (ref 0–44)
AST: 100 U/L — ABNORMAL HIGH (ref 15–41)
Albumin: 2.3 g/dL — ABNORMAL LOW (ref 3.5–5.0)
Alkaline Phosphatase: 59 U/L (ref 38–126)
Anion gap: 10 (ref 5–15)
BUN: 8 mg/dL (ref 6–20)
CALCIUM: 8 mg/dL — AB (ref 8.9–10.3)
CO2: 23 mmol/L (ref 22–32)
Chloride: 102 mmol/L (ref 98–111)
Creatinine, Ser: 0.99 mg/dL (ref 0.44–1.00)
GFR calc Af Amer: >60 mL/min (ref >60)
GFR calc non Af Amer: >60 mL/min (ref >60)
Glucose, Bld: 172 mg/dL — ABNORMAL HIGH (ref 70–99)
Potassium: 3.4 mmol/L — ABNORMAL LOW (ref 3.5–5.1)
Sodium: 135 mmol/L (ref 135–145)
Total Bilirubin: 1.5 mg/dL — ABNORMAL HIGH (ref 0.3–1.2)
Total Protein: 6.5 g/dL (ref 6.5–8.1)

## 2018-09-17 LAB — RESPIRATORY PANEL BY PCR
Adenovirus: NOT DETECTED
Bordetella pertussis: NOT DETECTED
CHLAMYDOPHILA PNEUMONIAE-RVPPCR: NOT DETECTED
CORONAVIRUS 229E-RVPPCR: NOT DETECTED
Coronavirus HKU1: NOT DETECTED
Coronavirus NL63: NOT DETECTED
Coronavirus OC43: NOT DETECTED
Influenza A: NOT DETECTED
Influenza B: NOT DETECTED
MYCOPLASMA PNEUMONIAE-RVPPCR: NOT DETECTED
Metapneumovirus: NOT DETECTED
Parainfluenza Virus 1: NOT DETECTED
Parainfluenza Virus 2: NOT DETECTED
Parainfluenza Virus 3: NOT DETECTED
Parainfluenza Virus 4: NOT DETECTED
Respiratory Syncytial Virus: NOT DETECTED
Rhinovirus / Enterovirus: NOT DETECTED

## 2018-09-17 LAB — HIV ANTIBODY (ROUTINE TESTING W REFLEX): HIV SCREEN 4TH GENERATION: NONREACTIVE

## 2018-09-17 LAB — CORTISOL-AM, BLOOD: Cortisol - AM: 23.2 ug/dL — ABNORMAL HIGH (ref 6.7–22.6)

## 2018-09-17 LAB — PROCALCITONIN: Procalcitonin: 0.58 ng/mL

## 2018-09-17 LAB — MRSA PCR SCREENING: MRSA by PCR: NEGATIVE

## 2018-09-17 LAB — TSH: TSH: 1.185 u[IU]/mL (ref 0.350–4.500)

## 2018-09-17 LAB — PROTIME-INR
INR: 1.28
Prothrombin Time: 15.9 seconds — ABNORMAL HIGH (ref 11.4–15.2)

## 2018-09-17 MED ORDER — VANCOMYCIN HCL 10 G IV SOLR: 1750.0000 mg | INTRAVENOUS | Status: DC

## 2018-09-17 MED ORDER — IBUPROFEN 200 MG PO TABS: 400.0000 mg | ORAL_TABLET | Freq: Four times a day (QID) | ORAL | Status: DC | PRN

## 2018-09-17 MED ORDER — POTASSIUM CHLORIDE CRYS ER 20 MEQ PO TBCR
40.0000 meq | EXTENDED_RELEASE_TABLET | Freq: Once | ORAL | Status: AC
  Administered 2018-09-17: 40 meq via ORAL
  Filled 2018-09-17: qty 2

## 2018-09-17 NOTE — Progress Notes (Signed)
PROGRESS NOTE    Judy Robles  ELF:[id number] DOB: June 30, 1976 DOA: 09/16/2018 PCP: Jearld Lesch, MD  Brief Narrative: This is a chronically ill homeless female with history of chronic diastolic CHF, COPD, bipolar disorder, polysubstance abuse, history of narcotic dependence now on Suboxone, was just discharged from [facility] after hospitalization for CHF exacerbation, she was apparently also let go from her shelter in [place] because of her Suboxone use, presented to the emergency room yesterday evening with increasing shortness of breath, productive cough, fever.  Admits poor compliance with all her medications. -CXR was notable for bilateral lower lobe pneumonia  Assessment & Plan:  Sepsis/multifocal pneumonia -Improving, febrile to 101 this morning -Will discontinue vancomycin, continue Zosyn day -Follow-up blood cultures -DC IV fluids  Chronic pain/history of narcotic dependence, heroin abuse -Continue Suboxone -She was treated with gabapentin while at [facility]  Bipolar disorder -Continue SNRI  Hyponatremia -Improving with above management, discontinue IV fluids today  Chronic diastolic CHF -Clinically appears close to euvolemic, will restart oral diuretics tomorrow  Homelessness/substance abuse/current Suboxone use -Options limited, consult social work and case management  DVT prophylaxis:subcu Heparin Code Status: Full code Family Communication: No family at bedside Disposition Plan: shelter in few days  Consultants:      Procedures:   Antimicrobials:    Subjective: -Feels better, breathing improving, requests pain medicines and Xanax  Objective: Vitals:   09/16/18 1900 09/17/18 0025 09/17/18 0545 09/17/18 0701  BP: (!) 111/57 (!) 95/38 (!) 120/41   Pulse: 77 86 87   Resp:  19 (!) 22   Temp:   (!) 101 F (38.3 C)   TempSrc:   Axillary   SpO2:  92% 93% 95%  Weight:      Height:        Intake/Output Summary (Last 24 hours) at  09/17/2018 1047 Last data filed at 09/17/2018 0448 Gross per 24 hour  Intake 1800 ml  Output 1000 ml  Net 800 ml   Filed Weights   09/16/18 1448  Weight: 131.1 kg    Examination:  General exam: Appears calm and comfortable, disheveled, appears much older than stated age Respiratory system: Few scattered rhonchi in both lower lobes, poor air movement Cardiovascular system: S1 & S2 heard, RRR.   Gastrointestinal system: Abdomen is nondistended, soft and nontender.Normal bowel sounds heard. Central nervous system: Alert and oriented. No focal neurological deficits. Extremities: no Edema Skin: No rashes, lesions or ulcers Psychiatry: Judgement and insight appear normal. Mood & affect appropriate.     Data Reviewed:   CBC: Recent Labs  Lab 09/16/18 1636  WBC 12.6*  NEUTROABS 9.4*  HGB 9.6*  HCT 29.6*  MCV 100.7*  PLT 196   Basic Metabolic Panel: Recent Labs  Lab 09/16/18 1535 09/17/18 0206  NA 132* 135  K 3.3* 3.4*  CL 100 102  CO2 23 23  GLUCOSE 146* 172*  BUN 10 8  CREATININE 1.00 0.99  CALCIUM 8.2* 8.0*   GFR: Estimated Creatinine Clearance: 96 mL/min (by C-G formula based on SCr of 0.99 mg/dL). Liver Function Tests: Recent Labs  Lab 09/16/18 1535 09/17/18 0206  AST 93* 100*  ALT 77* 80*  ALKPHOS 56 59  BILITOT 1.3* 1.5*  PROT 6.6 6.5  ALBUMIN 2.5* 2.3*   No results for input(s): LIPASE, AMYLASE in the last 168 hours. No results for input(s): AMMONIA in the last 168 hours. Coagulation Profile: Recent Labs  Lab 09/17/18 0206  INR 1.28   Cardiac Enzymes: No results  for input(s): CKTOTAL, CKMB, CKMBINDEX, TROPONINI in the last 168 hours. BNP (last 3 results) No results for input(s): PROBNP in the last 8760 hours. HbA1C: No results for input(s): HGBA1C in the last 72 hours. CBG: No results for input(s): GLUCAP in the last 168 hours. Lipid Profile: No results for input(s): CHOL, HDL, LDLCALC, TRIG, CHOLHDL, LDLDIRECT in the last 72  hours. Thyroid Function Tests: Recent Labs    09/17/18 0206  TSH 1.185   Anemia Panel: No results for input(s): VITAMINB12, FOLATE, FERRITIN, TIBC, IRON, RETICCTPCT in the last 72 hours. Urine analysis:    Component Value Date/Time   COLORURINE AMBER (A) 09/16/2018 1845   APPEARANCEUR CLOUDY (A) 09/16/2018 1845   LABSPEC 1.013 09/16/2018 1845   PHURINE 6.0 09/16/2018 1845   GLUCOSEU NEGATIVE 09/16/2018 1845   HGBUR LARGE (A) 09/16/2018 1845   BILIRUBINUR NEGATIVE 09/16/2018 1845   KETONESUR NEGATIVE 09/16/2018 1845   PROTEINUR NEGATIVE 09/16/2018 1845   UROBILINOGEN 0.2 07/03/2015 0618   NITRITE NEGATIVE 09/16/2018 1845   LEUKOCYTESUR SMALL (A) 09/16/2018 1845   Sepsis Labs: @LABRCNTIP (procalcitonin:4,lacticidven:4)  ) Recent Results (from the past 240 hour(s))  MRSA PCR Screening     Status: None   Collection Time: 09/17/18  7:27 AM  Result Value Ref Range Status   MRSA by PCR NEGATIVE NEGATIVE Final    Comment:        The GeneXpert MRSA Assay (FDA approved for NASAL specimens only), is one component of a comprehensive MRSA colonization surveillance program. It is not intended to diagnose MRSA infection nor to guide or monitor treatment for MRSA infections. Performed at [facility], [address] [place], [place] [address]          Radiology Studies: Dg Chest Portable 1 View  Result Date: 09/16/2018 CLINICAL DATA:  Shortness of breath over the last 2 days. EXAM: PORTABLE CHEST 1 VIEW COMPARISON:  07/07/2018 FINDINGS: Artifact overlies the chest. There is airspace filling in both lower lobes, more extensive on the left than the right, consistent with bilateral lower lobe pneumonia. Upper lobes are clear. No visible effusion. No acute bone finding. IMPRESSION: Bilateral lower lobe pneumonia, left worse than right. Electronically Signed   By: Paulina Fusi M.D.   On: 09/16/2018 14:59        Scheduled Meds: . buprenorphine-naloxone  1 tablet  Sublingual BID  . heparin  5,000 Units Subcutaneous Q8H  . lurasidone  20 mg Oral Q supper  . nicotine  21 mg Transdermal Daily  . traZODone  50 mg Oral QHS  . venlafaxine XR  150 mg Oral Q breakfast   Continuous Infusions: . ceFEPime (MAXIPIME) IV 1 g (09/17/18 0545)  . sodium chloride    . [START ON 09/18/2018] vancomycin       LOS: 1 day    Time spent:    Zannie Cove, MD [facility]  09/17/2018, 10:47 AM

## 2018-09-17 NOTE — Plan of Care (Signed)
  Problem: Clinical Measurements: Goal: Respiratory complications will improve Outcome: Progressing   Problem: Clinical Measurements: Goal: Ability to maintain clinical measurements within normal limits will improve Outcome: Progressing   

## 2018-09-17 NOTE — Progress Notes (Signed)
   09/17/18 1218  Clinical Encounter Type  Visited With Patient  Visit Type Initial  Referral From Nurse  Consult/Referral To Chaplain  The chaplain responded to consult for Pt. Spiritual care.  The Pt. welcomed the chaplain into the room.  The Pt. shared her recent hospital admission and homelessness has separated her from her family. The Pt. Is grieving her son's death and the  disconnection from the planning of her son's funeral.  The Pt. accepted prayer and the gift of paper/envelope/pen.  The chaplain saw the Pt. emotions lift with the opportunity to write her son a letter and placing it in her son's casket.  The chaplain communicated with the RN before leaving the clinical unit.  The chaplain will F/U with spiritual care as needed.

## 2018-09-17 NOTE — Progress Notes (Signed)
Pharmacy Antibiotic Note  Judy Robles is a 43 y.o. female admitted on 09/16/2018 with sepsis.  Pharmacy has been consulted for Vancomycin and Cefepime dosing.  On broad abx for suspected PNA. CXR shows bilateral lower lobe PNA. Tmax 101, WBC 12.6  Vancomycin 1,750 mg IV Q 24 hrs. Goal AUC 400-550. Expected AUC: 492.7 (BMI > 30) SCr used: 0.99  Plan: Decrease vancomycin to 1,750mg  IV Q24h Continue cefepime 1g IV q8hr Monitor renal function, C&S, and vanc levels as appropriate  Height: 5\' 3"  (160 cm) Weight: 289 lb (131.1 kg) IBW/kg (Calculated) : 52.4  Temp (24hrs), Avg:99.9 F (37.7 C), Min:99.1 F (37.3 C), Max:101 F (38.3 C)  Recent Labs  Lab 09/16/18 1535 09/16/18 1636 09/16/18 1725 09/17/18 0206  WBC  --  12.6*  --   --   CREATININE 1.00  --   --  0.99  LATICACIDVEN 1.8  --  1.4  --     Estimated Creatinine Clearance: 96 mL/min (by C-G formula based on SCr of 0.99 mg/dL).    Allergies  Allergen Reactions  . Tylenol [Acetaminophen] Itching and Other (See Comments)    Welts  . Lisinopril Other (See Comments)    Shuts down kidneys    Antimicrobials this admission: Vanc 1/26 >>  Cefepime 1/26 >>   Thank you for allowing pharmacy to be a part of this patient's care.  Enzo Bi, PharmD, BCPS, [facility] Clinical Pharmacist Phone number [phone] 09/17/2018 8:57 AM

## 2018-09-18 ENCOUNTER — Encounter (HOSPITAL_COMMUNITY): Payer: Self-pay

## 2018-09-18 DIAGNOSIS — E876 Hypokalemia: Secondary | ICD-10-CM

## 2018-09-18 LAB — CBC
HCT: 30.8 % — ABNORMAL LOW (ref 36.0–46.0)
Hemoglobin: 9.6 g/dL — ABNORMAL LOW (ref 12.0–15.0)
MCH: 31.9 pg (ref 26.0–34.0)
MCHC: 31.2 g/dL (ref 30.0–36.0)
MCV: 102.3 fL — ABNORMAL HIGH (ref 80.0–100.0)
Platelets: 197 10*3/uL (ref 150–400)
RBC: 3.01 MIL/uL — ABNORMAL LOW (ref 3.87–5.11)
RDW: 13.4 % (ref 11.5–15.5)
WBC: 10.3 10*3/uL (ref 4.0–10.5)
nRBC: 0 % (ref 0.0–0.2)

## 2018-09-18 LAB — BASIC METABOLIC PANEL
ANION GAP: 8 (ref 5–15)
BUN: 8 mg/dL (ref 6–20)
CO2: 25 mmol/L (ref 22–32)
Calcium: 8 mg/dL — ABNORMAL LOW (ref 8.9–10.3)
Chloride: 104 mmol/L (ref 98–111)
Creatinine, Ser: 1.01 mg/dL — ABNORMAL HIGH (ref 0.44–1.00)
GFR calc Af Amer: >60 mL/min (ref >60)
GFR calc non Af Amer: >60 mL/min (ref >60)
Glucose, Bld: 169 mg/dL — ABNORMAL HIGH (ref 70–99)
Potassium: 4.2 mmol/L (ref 3.5–5.1)
Sodium: 137 mmol/L (ref 135–145)

## 2018-09-18 LAB — URINE CULTURE: Culture: NO GROWTH

## 2018-09-18 MED ORDER — FUROSEMIDE 10 MG/ML IJ SOLN
20.0000 mg | Freq: Once | INTRAMUSCULAR | Status: AC
  Administered 2018-09-18: 20 mg via INTRAVENOUS
  Filled 2018-09-18: qty 2

## 2018-09-18 MED ORDER — IPRATROPIUM-ALBUTEROL 0.5-2.5 (3) MG/3ML IN SOLN
3.0000 mL | RESPIRATORY_TRACT | Status: DC | PRN
  Administered 2018-09-18: 3 mL via RESPIRATORY_TRACT
  Filled 2018-09-18: qty 3

## 2018-09-18 MED ORDER — FUROSEMIDE 40 MG PO TABS
40.0000 mg | ORAL_TABLET | Freq: Every day | ORAL | Status: DC
  Administered 2018-09-19 – 2018-09-23 (×5): 40 mg via ORAL
  Filled 2018-09-18 (×5): qty 1

## 2018-09-18 MED ORDER — GUAIFENESIN-DM 100-10 MG/5ML PO SYRP
5.0000 mL | ORAL_SOLUTION | ORAL | Status: DC | PRN
  Administered 2018-09-18 – 2018-09-23 (×3): 5 mL via ORAL
  Filled 2018-09-18 (×3): qty 5

## 2018-09-18 MED ORDER — FUROSEMIDE 10 MG/ML IJ SOLN
40.0000 mg | Freq: Once | INTRAMUSCULAR | Status: AC
  Administered 2018-09-18: 40 mg via INTRAVENOUS
  Filled 2018-09-18: qty 4

## 2018-09-18 NOTE — Progress Notes (Signed)
   09/18/18 0947  Clinical Encounter Type  Visited With Patient  Visit Type Follow-up  Referral From Patient  Consult/Referral To Chaplain  The Pt. Requested F/U spiritual care visit from the chaplain.  Upon arrival, the Pt. shared with the chaplain she had a rough night with her breathing and was trying to get some rest.  The chaplain will F/U with spiritual care as needed.

## 2018-09-18 NOTE — Progress Notes (Signed)
PROGRESS NOTE    Judy Robles  LOV:[id number] DOB: [birth year] DOA: 09/16/2018 PCP: Jearld Lesch, MD  Brief Narrative: This is a chronically ill homeless female with history of chronic diastolic CHF, COPD, bipolar disorder, polysubstance abuse, history of narcotic dependence now on Suboxone, was just discharged from [facility] after hospitalization for CHF exacerbation, she was apparently also let go from her shelter in [place] because of her Suboxone use, presented to the emergency room yesterday evening with increasing shortness of breath, productive cough, fever.  Admits poor compliance with all her medications. -CXR was notable for bilateral lower lobe pneumonia  Assessment & Plan:  Sepsis/multifocal pneumonia -Improving, temperature of 101 yesterday, afebrile today  -febrile to 101 this morning -Blood cultures are negative, discontinued vancomycin yesterday, continue Cefepime day 2 -De-escalate antibiotics in 48 hours if oxygen requirement and clinical improvement continues   Acute hypoxic respiratory failure -Due to worsening multifocal pneumonia, chest x-ray overnight noted -No wheezing noted, continue antibiotics, nebulizations, low threshold to add IV steroids -Afebrile, leukocytosis is improving -as Above -also has untreated sleep apnea which is difficult to manage with current homelessness  Chronic pain/history of narcotic dependence, heroin abuse -Continue Suboxone -She was treated with gabapentin while at [facility]  Bipolar disorder -Continue SNRI  Hyponatremia -Improving with above management, resolved  Chronic diastolic CHF -Clinically appears close to euvolemic - will restart diuretics today  Homelessness/substance abuse/current Suboxone use -Options limited, consult social work and case management  DVT prophylaxis:subcu Heparin Code Status: Full code Family Communication: No family at bedside Disposition Plan: shelter in few  days  Consultants:      Procedures:   Antimicrobials:    Subjective: -increase oxygen requirement overnight, increased cough congestion Objective: Vitals:   09/18/18 0645 09/18/18 0655 09/18/18 0715 09/18/18 0819  BP:    (!) 112/56  Pulse:    83  Resp: (!) 32 (!) 36  (!) 22  Temp:    98 F (36.7 C)  TempSrc:      SpO2: (!) 89% 91% 94% 96%  Weight:      Height:        Intake/Output Summary (Last 24 hours) at 09/18/2018 1341 Last data filed at 09/18/2018 8841 Gross per 24 hour  Intake 1072.93 ml  Output 500 ml  Net 572.93 ml   Filed Weights   09/16/18 1448  Weight: 131.1 kg    Examination:  Gen: Clear ill disheveled female, appears much older than stated age HEENT: PERRLA, Neck supple, no JVD Lungs: Rhonchi in both lower lobes, poor air movement CVS: S1-S2/regular rate rhythm, tachycardic Abd: soft, Non tender, non distended, BS present Extremities: No  edema Skin: no new rashes Psychiatry: Judgement and insight appear normal. Mood & affect appropriate.     Data Reviewed:   CBC: Recent Labs  Lab 09/16/18 1636 09/18/18 0308  WBC 12.6* 10.3  NEUTROABS 9.4*  --   HGB 9.6* 9.6*  HCT 29.6* 30.8*  MCV 100.7* 102.3*  PLT 196 197   Basic Metabolic Panel: Recent Labs  Lab 09/16/18 1535 09/17/18 0206 09/18/18 0308  NA 132* 135 137  K 3.3* 3.4* 4.2  CL 100 102 104  CO2 23 23 25   GLUCOSE 146* 172* 169*  BUN 10 8 8   CREATININE 1.00 0.99 1.01*  CALCIUM 8.2* 8.0* 8.0*   GFR: Estimated Creatinine Clearance: 94.1 mL/min (A) (by C-G formula based on SCr of 1.01 mg/dL (H)). Liver Function Tests: Recent Labs  Lab 09/16/18 1535 09/17/18 0206  AST 93* 100*  ALT 77* 80*  ALKPHOS 56 59  BILITOT 1.3* 1.5*  PROT 6.6 6.5  ALBUMIN 2.5* 2.3*   No results for input(s): LIPASE, AMYLASE in the last 168 hours. No results for input(s): AMMONIA in the last 168 hours. Coagulation Profile: Recent Labs  Lab 09/17/18 0206  INR 1.28   Cardiac Enzymes: No  results for input(s): CKTOTAL, CKMB, CKMBINDEX, TROPONINI in the last 168 hours. BNP (last 3 results) No results for input(s): PROBNP in the last 8760 hours. HbA1C: No results for input(s): HGBA1C in the last 72 hours. CBG: No results for input(s): GLUCAP in the last 168 hours. Lipid Profile: No results for input(s): CHOL, HDL, LDLCALC, TRIG, CHOLHDL, LDLDIRECT in the last 72 hours. Thyroid Function Tests: Recent Labs    09/17/18 0206  TSH 1.185   Anemia Panel: No results for input(s): VITAMINB12, FOLATE, FERRITIN, TIBC, IRON, RETICCTPCT in the last 72 hours. Urine analysis:    Component Value Date/Time   COLORURINE AMBER (A) 09/16/2018 1845   APPEARANCEUR CLOUDY (A) 09/16/2018 1845   LABSPEC 1.013 09/16/2018 1845   PHURINE 6.0 09/16/2018 1845   GLUCOSEU NEGATIVE 09/16/2018 1845   HGBUR LARGE (A) 09/16/2018 1845   BILIRUBINUR NEGATIVE 09/16/2018 1845   KETONESUR NEGATIVE 09/16/2018 1845   PROTEINUR NEGATIVE 09/16/2018 1845   UROBILINOGEN 0.2 07/03/2015 0618   NITRITE NEGATIVE 09/16/2018 1845   LEUKOCYTESUR SMALL (A) 09/16/2018 1845   Sepsis Labs: @LABRCNTIP (procalcitonin:4,lacticidven:4)  ) Recent Results (from the past 240 hour(s))  Blood culture (routine x 2)     Status: None (Preliminary result)   Collection Time: 09/16/18  4:29 PM  Result Value Ref Range Status   Specimen Description BLOOD LEFT FOREARM  Final   Special Requests   Final    BOTTLES DRAWN AEROBIC AND ANAEROBIC Blood Culture adequate volume   Culture   Final    NO GROWTH 2 DAYS Performed at [facility], [address] [place], [place] [address]    Report Status PENDING  Incomplete  Blood culture (routine x 2)     Status: None (Preliminary result)   Collection Time: 09/16/18  4:29 PM  Result Value Ref Range Status   Specimen Description BLOOD RIGHT HAND  Final   Special Requests   Final    BOTTLES DRAWN AEROBIC ONLY Blood Culture adequate volume   Culture   Final    NO GROWTH 2  DAYS Performed at [facility], [address] [place], [place] [address]    Report Status PENDING  Incomplete  Urine culture     Status: None   Collection Time: 09/16/18  5:33 PM  Result Value Ref Range Status   Specimen Description URINE, CLEAN CATCH  Final   Special Requests NONE  Final   Culture   Final    NO GROWTH Performed at [facility], [address] [place], [place] [address]    Report Status 09/18/2018 FINAL  Final  Respiratory Panel by PCR     Status: None   Collection Time: 09/16/18  7:40 PM  Result Value Ref Range Status   Adenovirus NOT DETECTED NOT DETECTED Final   Coronavirus 229E NOT DETECTED NOT DETECTED Final   Coronavirus HKU1 NOT DETECTED NOT DETECTED Final   Coronavirus NL63 NOT DETECTED NOT DETECTED Final   Coronavirus OC43 NOT DETECTED NOT DETECTED Final   Metapneumovirus NOT DETECTED NOT DETECTED Final   Rhinovirus / Enterovirus NOT DETECTED NOT DETECTED Final   Influenza  A NOT DETECTED NOT DETECTED Final   Influenza B NOT DETECTED NOT DETECTED Final   Parainfluenza Virus 1 NOT DETECTED NOT DETECTED Final   Parainfluenza Virus 2 NOT DETECTED NOT DETECTED Final   Parainfluenza Virus 3 NOT DETECTED NOT DETECTED Final   Parainfluenza Virus 4 NOT DETECTED NOT DETECTED Final   Respiratory Syncytial Virus NOT DETECTED NOT DETECTED Final   Bordetella pertussis NOT DETECTED NOT DETECTED Final   Chlamydophila pneumoniae NOT DETECTED NOT DETECTED Final   Mycoplasma pneumoniae NOT DETECTED NOT DETECTED Final    Comment: Performed at [facility], [address] [place], [place] [address]  MRSA PCR Screening     Status: None   Collection Time: 09/17/18  7:27 AM  Result Value Ref Range Status   MRSA by PCR NEGATIVE NEGATIVE Final    Comment:        The GeneXpert MRSA Assay (FDA approved for NASAL specimens only), is one component of a comprehensive MRSA colonization surveillance program. It is not intended to diagnose MRSA infection  nor to guide or monitor treatment for MRSA infections. Performed at [facility], [address], [place], [place] [address]          Radiology Studies: X-ray Chest Pa And Lateral  Result Date: 09/17/2018 CLINICAL DATA:  Shortness of breath.  Pneumonia EXAM: CHEST - 2 VIEW COMPARISON:  Yesterday FINDINGS: Extensive bilateral airspace disease with mild progression on the right in the suprahilar lung. Stable borderline heart size. No effusion or pneumothorax. IMPRESSION: Extensive pneumonia with mild progression since yesterday. Electronically Signed   By: Marnee Spring M.D.   On: 09/17/2018 11:05   Dg Chest Portable 1 View  Result Date: 09/16/2018 CLINICAL DATA:  Shortness of breath over the last 2 days. EXAM: PORTABLE CHEST 1 VIEW COMPARISON:  07/07/2018 FINDINGS: Artifact overlies the chest. There is airspace filling in both lower lobes, more extensive on the left than the right, consistent with bilateral lower lobe pneumonia. Upper lobes are clear. No visible effusion. No acute bone finding. IMPRESSION: Bilateral lower lobe pneumonia, left worse than right. Electronically Signed   By: Paulina Fusi M.D.   On: 09/16/2018 14:59        Scheduled Meds: . buprenorphine-naloxone  1 tablet Sublingual BID  . heparin  5,000 Units Subcutaneous Q8H  . lurasidone  20 mg Oral Q supper  . nicotine  21 mg Transdermal Daily  . traZODone  50 mg Oral QHS  . venlafaxine XR  150 mg Oral Q breakfast   Continuous Infusions: . ceFEPime (MAXIPIME) IV Stopped (09/18/18 1478)  . sodium chloride       LOS: 2 days    Time spent:    Zannie Cove, MD [facility]  09/18/2018, 1:41 PM

## 2018-09-19 LAB — CBC
HCT: 31.1 % — ABNORMAL LOW (ref 36.0–46.0)
HEMOGLOBIN: 9.8 g/dL — AB (ref 12.0–15.0)
MCH: 31.9 pg (ref 26.0–34.0)
MCHC: 31.5 g/dL (ref 30.0–36.0)
MCV: 101.3 fL — ABNORMAL HIGH (ref 80.0–100.0)
Platelets: 207 10*3/uL (ref 150–400)
RBC: 3.07 MIL/uL — ABNORMAL LOW (ref 3.87–5.11)
RDW: 13.6 % (ref 11.5–15.5)
WBC: 8.2 10*3/uL (ref 4.0–10.5)
nRBC: 0 % (ref 0.0–0.2)

## 2018-09-19 LAB — BASIC METABOLIC PANEL
Anion gap: 9 (ref 5–15)
BUN: 9 mg/dL (ref 6–20)
CHLORIDE: 105 mmol/L (ref 98–111)
CO2: 24 mmol/L (ref 22–32)
Calcium: 7.9 mg/dL — ABNORMAL LOW (ref 8.9–10.3)
Creatinine, Ser: 0.95 mg/dL (ref 0.44–1.00)
GFR calc Af Amer: >60 mL/min (ref >60)
GFR calc non Af Amer: >60 mL/min (ref >60)
Glucose, Bld: 98 mg/dL (ref 70–99)
Potassium: 3.5 mmol/L (ref 3.5–5.1)
Sodium: 138 mmol/L (ref 135–145)

## 2018-09-19 MED ORDER — POTASSIUM CHLORIDE CRYS ER 20 MEQ PO TBCR
40.0000 meq | EXTENDED_RELEASE_TABLET | Freq: Every day | ORAL | Status: DC
  Administered 2018-09-19 – 2018-09-23 (×5): 40 meq via ORAL
  Filled 2018-09-19 (×5): qty 2

## 2018-09-19 NOTE — Progress Notes (Signed)
PROGRESS NOTE    Judy Robles  DGL:[id number] DOB: 26-Dec-1975 DOA: 09/16/2018 PCP: Jearld Lesch, MD  Brief Narrative: This is a chronically ill homeless female with history of chronic diastolic CHF, COPD, bipolar disorder, polysubstance abuse, history of narcotic dependence now on Suboxone, was just discharged from [facility] after hospitalization for CHF exacerbation, she was apparently also let go from her shelter in [place] because of her Suboxone use, presented to the emergency room yesterday evening with increasing shortness of breath, productive cough, fever.  Admits poor compliance with all her medications. -CXR was notable for bilateral lower lobe pneumonia  Assessment & Plan:  Sepsis/multifocal pneumonia -Improving, afebrile now -Blood cultures are negative,Off vancomycin, continue cefepime day 3 -Due to severity of pneumonia and high oxygen requirement will leave on IV cefepime for another 1 to 2 days  Acute hypoxic respiratory failure -Due to worsening multifocal pneumonia, chest x-ray overnight noted -O2 requirement improving was on 10 to 15 L high flow nasal cannula until yesterday  -Afebrile, leukocytosis is improving -abx as Above -also has untreated sleep apnea which is difficult to manage with current homelessness  Chronic pain/history of narcotic dependence, heroin abuse -Continue Suboxone -She was treated with gabapentin while at [facility] -Case management consulted  Bipolar disorder -Continue SNRI  Hyponatremia -Improving with above management, resolved  Chronic diastolic CHF -Clinically appears close to euvolemic -Restarted oral Lasix 40 mg daily  Homelessness/substance abuse/current Suboxone use -Options limited, consult social work and case management  DVT prophylaxis:subcu Heparin Code Status: Full code Family Communication: No family at bedside Disposition Plan: shelter in few days  Consultants:      Procedures:    Antimicrobials:    Subjective:  -finally improving O2 needs, down to 4L from 10L yesterday Objective: Vitals:   09/18/18 1920 09/18/18 2325 09/19/18 0200 09/19/18 0759  BP:  108/60  (!) 117/58  Pulse:  77  73  Resp: 20   16  Temp:  98.4 F (36.9 C)  98.1 F (36.7 C)  TempSrc:  Oral  Oral  SpO2: 91% 94% 93% 93%  Weight:      Height:        Intake/Output Summary (Last 24 hours) at 09/19/2018 1053 Last data filed at 09/19/2018 0645 Gross per 24 hour  Intake 939.48 ml  Output 2050 ml  Net -1110.52 ml   Filed Weights   09/16/18 1448  Weight: 131.1 kg    Examination:  Gen: Chronically ill, disheveled female, AAOx3, no distress today HEENT: PERRLA, Neck supple, no JVD Lungs: improving air movement, rhonchi in both bases  CVS: RRR,No Gallops,Rubs or new Murmurs Abd: soft, Non tender, non distended, BS present Extremities: No edema Skin: no new rashes Psychiatry: Judgement and insight appear normal. Mood & affect appropriate.     Data Reviewed:   CBC: Recent Labs  Lab 09/16/18 1636 09/18/18 0308 09/19/18 0253  WBC 12.6* 10.3 8.2  NEUTROABS 9.4*  --   --   HGB 9.6* 9.6* 9.8*  HCT 29.6* 30.8* 31.1*  MCV 100.7* 102.3* 101.3*  PLT 196 197 207   Basic Metabolic Panel: Recent Labs  Lab 09/16/18 1535 09/17/18 0206 09/18/18 0308 09/19/18 0253  NA 132* 135 137 138  K 3.3* 3.4* 4.2 3.5  CL 100 102 104 105  CO2 23 23 25 24   GLUCOSE 146* 172* 169* 98  BUN 10 8 8 9   CREATININE 1.00 0.99 1.01* 0.95  CALCIUM 8.2* 8.0* 8.0* 7.9*   GFR: Estimated Creatinine Clearance:  100.1 mL/min (by C-G formula based on SCr of 0.95 mg/dL). Liver Function Tests: Recent Labs  Lab 09/16/18 1535 09/17/18 0206  AST 93* 100*  ALT 77* 80*  ALKPHOS 56 59  BILITOT 1.3* 1.5*  PROT 6.6 6.5  ALBUMIN 2.5* 2.3*   No results for input(s): LIPASE, AMYLASE in the last 168 hours. No results for input(s): AMMONIA in the last 168 hours. Coagulation Profile: Recent Labs  Lab  09/17/18 0206  INR 1.28   Cardiac Enzymes: No results for input(s): CKTOTAL, CKMB, CKMBINDEX, TROPONINI in the last 168 hours. BNP (last 3 results) No results for input(s): PROBNP in the last 8760 hours. HbA1C: No results for input(s): HGBA1C in the last 72 hours. CBG: No results for input(s): GLUCAP in the last 168 hours. Lipid Profile: No results for input(s): CHOL, HDL, LDLCALC, TRIG, CHOLHDL, LDLDIRECT in the last 72 hours. Thyroid Function Tests: Recent Labs    09/17/18 0206  TSH 1.185   Anemia Panel: No results for input(s): VITAMINB12, FOLATE, FERRITIN, TIBC, IRON, RETICCTPCT in the last 72 hours. Urine analysis:    Component Value Date/Time   COLORURINE AMBER (A) 09/16/2018 1845   APPEARANCEUR CLOUDY (A) 09/16/2018 1845   LABSPEC 1.013 09/16/2018 1845   PHURINE 6.0 09/16/2018 1845   GLUCOSEU NEGATIVE 09/16/2018 1845   HGBUR LARGE (A) 09/16/2018 1845   BILIRUBINUR NEGATIVE 09/16/2018 1845   KETONESUR NEGATIVE 09/16/2018 1845   PROTEINUR NEGATIVE 09/16/2018 1845   UROBILINOGEN 0.2 07/03/2015 0618   NITRITE NEGATIVE 09/16/2018 1845   LEUKOCYTESUR SMALL (A) 09/16/2018 1845   Sepsis Labs: @LABRCNTIP (procalcitonin:4,lacticidven:4)  ) Recent Results (from the past 240 hour(s))  Blood culture (routine x 2)     Status: None (Preliminary result)   Collection Time: 09/16/18  4:29 PM  Result Value Ref Range Status   Specimen Description BLOOD LEFT FOREARM  Final   Special Requests   Final    BOTTLES DRAWN AEROBIC AND ANAEROBIC Blood Culture adequate volume   Culture   Final    NO GROWTH 3 DAYS Performed at [facility], [address] [place], [place] [address]    Report Status PENDING  Incomplete  Blood culture (routine x 2)     Status: None (Preliminary result)   Collection Time: 09/16/18  4:29 PM  Result Value Ref Range Status   Specimen Description BLOOD RIGHT HAND  Final   Special Requests   Final    BOTTLES DRAWN AEROBIC ONLY Blood Culture adequate  volume   Culture   Final    NO GROWTH 3 DAYS Performed at [facility], [address] [place], [place] [address]    Report Status PENDING  Incomplete  Urine culture     Status: None   Collection Time: 09/16/18  5:33 PM  Result Value Ref Range Status   Specimen Description URINE, CLEAN CATCH  Final   Special Requests NONE  Final   Culture   Final    NO GROWTH Performed at [facility], [address] [place], [place] [address]    Report Status 09/18/2018 FINAL  Final  Respiratory Panel by PCR     Status: None   Collection Time: 09/16/18  7:40 PM  Result Value Ref Range Status   Adenovirus NOT DETECTED NOT DETECTED Final   Coronavirus 229E NOT DETECTED NOT DETECTED Final   Coronavirus HKU1 NOT DETECTED NOT DETECTED Final   Coronavirus NL63 NOT DETECTED NOT DETECTED Final   Coronavirus OC43 NOT DETECTED NOT  DETECTED Final   Metapneumovirus NOT DETECTED NOT DETECTED Final   Rhinovirus / Enterovirus NOT DETECTED NOT DETECTED Final   Influenza A NOT DETECTED NOT DETECTED Final   Influenza B NOT DETECTED NOT DETECTED Final   Parainfluenza Virus 1 NOT DETECTED NOT DETECTED Final   Parainfluenza Virus 2 NOT DETECTED NOT DETECTED Final   Parainfluenza Virus 3 NOT DETECTED NOT DETECTED Final   Parainfluenza Virus 4 NOT DETECTED NOT DETECTED Final   Respiratory Syncytial Virus NOT DETECTED NOT DETECTED Final   Bordetella pertussis NOT DETECTED NOT DETECTED Final   Chlamydophila pneumoniae NOT DETECTED NOT DETECTED Final   Mycoplasma pneumoniae NOT DETECTED NOT DETECTED Final    Comment: Performed at [facility], [address], [place], [place] [address]  MRSA PCR Screening     Status: None   Collection Time: 09/17/18  7:27 AM  Result Value Ref Range Status   MRSA by PCR NEGATIVE NEGATIVE Final    Comment:        The GeneXpert MRSA Assay (FDA approved for NASAL specimens only), is one component of a comprehensive MRSA colonization surveillance program. It is  not intended to diagnose MRSA infection nor to guide or monitor treatment for MRSA infections. Performed at [facility], [address], [place], [place] [address]          Radiology Studies: X-ray Chest Pa And Lateral  Result Date: 09/17/2018 CLINICAL DATA:  Shortness of breath.  Pneumonia EXAM: CHEST - 2 VIEW COMPARISON:  Yesterday FINDINGS: Extensive bilateral airspace disease with mild progression on the right in the suprahilar lung. Stable borderline heart size. No effusion or pneumothorax. IMPRESSION: Extensive pneumonia with mild progression since yesterday. Electronically Signed   By: Marnee Spring M.D.   On: 09/17/2018 11:05        Scheduled Meds: . buprenorphine-naloxone  1 tablet Sublingual BID  . furosemide  40 mg Oral Daily  . heparin  5,000 Units Subcutaneous Q8H  . lurasidone  20 mg Oral Q supper  . nicotine  21 mg Transdermal Daily  . traZODone  50 mg Oral QHS  . venlafaxine XR  150 mg Oral Q breakfast   Continuous Infusions: . ceFEPime (MAXIPIME) IV Stopped (09/19/18 9150)  . sodium chloride       LOS: 3 days    Time spent:    Zannie Cove, MD [facility]  09/19/2018, 10:53 AM

## 2018-09-19 NOTE — Progress Notes (Signed)
PT Cancellation Note  Patient Details Name: Rosana Fretancy M Katzenberger MRN: [id number] DOB: 1976/05/08   Cancelled Treatment:    Reason Eval/Treat Not Completed: Other (comment).  Pt reports she is feeling tired, has been up walking prior to PT arrival and would like to wait until tomorrow.  Agreed to return in the AM.   Ivar DrapeRuth E Zamiah Tollett 09/19/2018, 4:51 PM   Samul Dadauth Gervis Gaba, PT MS Acute Rehab Dept. Number: [facility] [phone] and [facility] [phone]

## 2018-09-19 NOTE — Care Management Note (Addendum)
Case Management Note  Patient Details  Name: Judy Robles MRN: [id number] Date of Birth: 24-May-1976  Subjective/Objective:   Patient is homeless, she states she was in [place] before she came here to the hospital, she does not have a MD here, NCM will schedule her a follow up apt .  She will need bus passes at discharge.  She states she will go to her son's dad home for the funeral of her son but she will not be staying there , so she will be going to a shelter.  She has [employer] but has no funds to pay for medications, she goes to [facility].  [facility] to see if they can give her a waiver for her medications at discharge.  She is currently taking suboxone.  NCM spoke with Judy Robles , pharmacist at [facility], he states they can definitely help her with her medications except for the suboxone.  If she has a place to go to , they can also delivery the meds to her.  Per the [facility] patient has been taking suboxone for months ,was prescribed by Judy Robles at [facility] outpatient clinic [phone].   1/31 Judy Cape RN, BSN  patient may need home oxygen at dc.  NCM spoke with Judy Robles with [facility] , informed him that patient will be going to shelter at discharge, he states he can get her oxygen with a portable concentrator.  He states Judy Robles will be working this weekend and he left [place] a note regarding this. NCM informed Scientific laboratory technician.             Action/Plan: NCM will follow for transition of care needs.   Expected Discharge Date:                  Expected Discharge Plan:  Homeless Shelter  In-House Referral:  Clinical Social Work  Discharge planning Services  CM Consult, Follow-up appt scheduled, [facility]  Post Acute Care Choice:    Choice offered to:     DME Arranged:    DME Agency:     HH Arranged:    HH Agency:     Status of Service:  In process, will continue to follow  If discussed at Long Length of Stay Meetings,  dates discussed:    Additional Comments:  Judy Haven, RN 09/19/2018, 1:26 PM

## 2018-09-20 LAB — CBC
HCT: 32.2 % — ABNORMAL LOW (ref 36.0–46.0)
Hemoglobin: 10.1 g/dL — ABNORMAL LOW (ref 12.0–15.0)
MCH: 31.8 pg (ref 26.0–34.0)
MCHC: 31.4 g/dL (ref 30.0–36.0)
MCV: 101.3 fL — ABNORMAL HIGH (ref 80.0–100.0)
Platelets: 229 10*3/uL (ref 150–400)
RBC: 3.18 MIL/uL — ABNORMAL LOW (ref 3.87–5.11)
RDW: 13.3 % (ref 11.5–15.5)
WBC: 8.1 10*3/uL (ref 4.0–10.5)
nRBC: 0 % (ref 0.0–0.2)

## 2018-09-20 LAB — BASIC METABOLIC PANEL
Anion gap: 10 (ref 5–15)
BUN: 15 mg/dL (ref 6–20)
CALCIUM: 7.9 mg/dL — AB (ref 8.9–10.3)
CO2: 24 mmol/L (ref 22–32)
Chloride: 104 mmol/L (ref 98–111)
Creatinine, Ser: 0.88 mg/dL (ref 0.44–1.00)
GFR calc Af Amer: >60 mL/min (ref >60)
GFR calc non Af Amer: >60 mL/min (ref >60)
Glucose, Bld: 97 mg/dL (ref 70–99)
Potassium: 3.7 mmol/L (ref 3.5–5.1)
Sodium: 138 mmol/L (ref 135–145)

## 2018-09-20 NOTE — Evaluation (Signed)
Physical Therapy Evaluation Patient Details Name: Judy Robles MRN: [id number] DOB: 08-25-1975 Today's Date: 09/20/2018   History of Present Illness  This is a chronically ill homeless female with history of chronic diastolic CHF, COPD, bipolar disorder, polysubstance abuse, history of narcotic dependence now on Suboxone, was just discharged from [facility] after hospitalization for CHF exacerbation, she was apparently also let go from her shelter in [place] because of her Suboxone use, presented to the emergency room yesterday evening with increasing shortness of breath, productive cough, fever.  Admits poor compliance with all her medications.    Clinical Impression  Pt admitted with above diagnosis. Pt currently with functional limitations due to the deficits listed below (see PT Problem List). PTA, pt living at friends, independent with all mobility no falls or home O2. Today pt ambulating hallway distances, mild desat on RA to 88%, DOE 2/4. Pt states she is 50% of her strength from baseline, rec HHPT for safe reconditioning.  Pt will benefit from skilled PT to increase their independence and safety with mobility to allow discharge to the venue listed below.       Follow Up Recommendations Home health PT    Equipment Recommendations  None recommended by PT    Recommendations for Other Services       Precautions / Restrictions Precautions Precautions: Fall Restrictions Weight Bearing Restrictions: No      Mobility  Bed Mobility Overal bed mobility: Independent                Transfers Overall transfer level: Modified independent                  Ambulation/Gait Ambulation/Gait assistance: Supervision Gait Distance (Feet): 60 Feet Assistive device: None Gait Pattern/deviations: Step-through pattern Gait velocity: decreased   General Gait Details: pt with mild unsteady gait, no overt LOB, DOE 2/4 with activity, de sat to 88% on RA with 60'  of walking.   Stairs            Wheelchair Mobility    Modified Rankin (Stroke Patients Only)       Balance Overall balance assessment: Mild deficits observed, not formally tested                                           Pertinent Vitals/Pain Pain Assessment: No/denies pain    Home Living Family/patient expects to be discharged to:: (friends house)                 Additional Comments: pt reports living at her friends house who is avail PRN, no stairs    Prior Function Level of Independence: Independent         Comments: no AD, no home O2, no falls     Hand Dominance        Extremity/Trunk Assessment   Upper Extremity Assessment Upper Extremity Assessment: Overall WFL for tasks assessed    Lower Extremity Assessment Lower Extremity Assessment: Overall WFL for tasks assessed       Communication   Communication: No difficulties  Cognition Arousal/Alertness: Awake/alert Behavior During Therapy: WFL for tasks assessed/performed Overall Cognitive Status: Within Functional Limits for tasks assessed  General Comments      Exercises     Assessment/Plan    PT Assessment Patient needs continued PT services  PT Problem List Decreased strength       PT Treatment Interventions DME instruction;Gait training    PT Goals (Current goals can be found in the Care Plan section)  Acute Rehab PT Goals Patient Stated Goal: go back to friends, get stronger PT Goal Formulation: With patient Time For Goal Achievement: 10/04/18 Potential to Achieve Goals: Good    Frequency Min 3X/week   Barriers to discharge Decreased caregiver support      Co-evaluation               AM-PAC PT "6 Clicks" Mobility  Outcome Measure Help needed turning from your back to your side while in a flat bed without using bedrails?: None Help needed moving from lying on your back to sitting on  the side of a flat bed without using bedrails?: None Help needed moving to and from a bed to a chair (including a wheelchair)?: None Help needed standing up from a chair using your arms (e.g., wheelchair or bedside chair)?: None Help needed to walk in hospital room?: A Little Help needed climbing 3-5 steps with a railing? : A Little 6 Click Score: 22    End of Session Equipment Utilized During Treatment: Gait belt Activity Tolerance: Patient tolerated treatment well Patient left: with call bell/phone within reach;with chair alarm set Nurse Communication: Mobility status PT Visit Diagnosis: Unsteadiness on feet (R26.81)    Time: 8850-2774 PT Time Calculation (min) (ACUTE ONLY): 23 min   Charges:   PT Evaluation $PT Eval Low Complexity: 1 Low PT Treatments $Gait Training: 8-22 mins       Etta Grandchild, PT, DPT Acute Rehabilitation Services Pager: [phone] Office: [phone]    Etta Grandchild 09/20/2018, 3:55 PM

## 2018-09-20 NOTE — Progress Notes (Signed)
PROGRESS NOTE  Judy Robles M Dack ZOX:[id number] DOB: [birth year] DOA: 09/16/2018 PCP: Jearld LeschWilliams, Dwight M, MD  HPI/Recap of past 24 hours: This is a chronically ill homeless female with history of chronic diastolic CHF, COPD, bipolar disorder, polysubstance abuse, history of narcotic dependence now on Suboxone, was just discharged [facility] after hospitalization for CHF exacerbation, she was apparently also let go from her shelter in [place] because of her Suboxone use, presented to the emergency room yesterday evening with increasing shortness of breath, productive cough, fever.  Admits poor compliance with all her medications. -CXR was notable for bilateral lower lobe pneumonia.  09/20/2018: Patient seen and examined at bedside.  No acute events overnight.  She states she is breathing better.  Independently reviewed chest x-ray which revealed significant bilateral pleural infiltrates affecting lower lobes.  Reports dyspnea with minimal exertion.  Assessment/Plan: Principal Problem:   Sepsis (HCC) Active Problems:   Primary hypertension   Chronic pain   Polysubstance dependence including opioid type drug with complication, episodic abuse (HCC)   Bipolar II disorder (HCC)   Severe benzodiazepine use disorder (HCC)   HCAP (healthcare-associated pneumonia)   LLL pneumonia (HCC)   Hyponatremia   Hypokalemia  Sepsis secondary to multifocal pneumonia -Independent review chest x-ray done on admission which revealed significant infiltrates involving lower lobes bilaterally. Continue IV antibiotics and round-the-clock breathing treatments Maintain O2 saturation greater than 92% Continuous O2 monitoring Monitor fever curve and WBC Obtain CBC in the morning  Acute hypoxic respiratory failure secondary to multifocal pneumonia -Management as stated above  -Due to worsening multifocal pneumonia, chest x-ray overnight noted -O2 requirement improving was on 10 to 15 L high  flow nasal cannula until yesterday  -Afebrile, leukocytosis is improving -abx as Above -also has untreated sleep apnea which is difficult to manage with current homelessness  Chronic pain/history of narcotic dependence, heroin abuse -Continue Suboxone -She was [facility] with [facility]entin [facility] -Case management consulted  Bipolar disorder -Continue SNRI  Hyponatremia -Improving with above management, resolved  Chronic diastolic CHF -Clinically appears close to euvolemic -Restarted oral Lasix 40 mg daily  Homelessness/substance abuse/current Suboxone use -Options limited, consult social work and case management  DVT prophylaxis:subcu Heparin 3 times daily Code Status: Full code Family Communication: No family at bedside Disposition Plan: shelter in few days  Consultants:  None   Objective: Vitals:   09/19/18 2000 09/19/18 2307 09/20/18 0200 09/20/18 0747  BP:  (!) 117/26  (!) 119/50  Pulse:  66  72  Resp: 20   15  Temp:  98.3 F (36.8 C)  98.9 F (37.2 C)  TempSrc:  Oral  Oral  SpO2: 93% 93% 91% 95%  Weight:      Height:        Intake/Output Summary (Last 24 hours) at 09/20/2018 1310 Last data filed at 09/20/2018 0630 Gross per 24 hour  Intake 565.17 ml  Output 1375 ml  Net -809.83 ml   Filed Weights   09/16/18 1448  Weight: 131.1 kg    Exam:  . General: 43 y.o. year-old female well developed well nourished in no acute distress.  Alert and oriented x3. . Cardiovascular: Regular rate and rhythm with no rubs or gallops.  No thyromegaly or JVD noted.   Marland Kitchen. Respiratory: Rales noted at bases bilaterally with no wheezes. Good inspiratory effort. . Abdomen: Soft nontender nondistended with normal bowel sounds x4 quadrants. . Musculoskeletal: Trace lower extremity edema. 2/4 pulses in all 4 extremities. Marland Kitchen. Psychiatry: Mood  is appropriate for condition and setting   Data Reviewed: CBC: Recent Labs  Lab 09/16/18 1636 09/18/18 0308 09/19/18 0253  09/20/18 0221  WBC 12.6* 10.3 8.2 8.1  NEUTROABS 9.4*  --   --   --   HGB 9.6* 9.6* 9.8* 10.1*  HCT 29.6* 30.8* 31.1* 32.2*  MCV 100.7* 102.3* 101.3* 101.3*  PLT 196 197 207 229   Basic Metabolic Panel: Recent Labs  Lab 09/16/18 1535 09/17/18 0206 09/18/18 0308 09/19/18 0253 09/20/18 0221  NA 132* 135 137 138 138  K 3.3* 3.4* 4.2 3.5 3.7  CL 100 102 104 105 104  CO2 23 23 25 24 24   GLUCOSE 146* 172* 169* 98 97  BUN 10 8 8 9 15   CREATININE 1.00 0.99 1.01* 0.95 0.88  CALCIUM 8.2* 8.0* 8.0* 7.9* 7.9*   GFR: Estimated Creatinine Clearance: 108.1 mL/min (by C-G formula based on SCr of 0.88 mg/dL). Liver Function Tests: Recent Labs  Lab 09/16/18 1535 09/17/18 0206  AST 93* 100*  ALT 77* 80*  ALKPHOS 56 59  BILITOT 1.3* 1.5*  PROT 6.6 6.5  ALBUMIN 2.5* 2.3*   No results for input(s): LIPASE, AMYLASE in the last 168 hours. No results for input(s): AMMONIA in the last 168 hours. Coagulation Profile: Recent Labs  Lab 09/17/18 0206  INR 1.28   Cardiac Enzymes: No results for input(s): CKTOTAL, CKMB, CKMBINDEX, TROPONINI in the last 168 hours. BNP (last 3 results) No results for input(s): PROBNP in the last 8760 hours. HbA1C: No results for input(s): HGBA1C in the last 72 hours. CBG: No results for input(s): GLUCAP in the last 168 hours. Lipid Profile: No results for input(s): CHOL, HDL, LDLCALC, TRIG, CHOLHDL, LDLDIRECT in the last 72 hours. Thyroid Function Tests: No results for input(s): TSH, T4TOTAL, FREET4, T3FREE, THYROIDAB in the last 72 hours. Anemia Panel: No results for input(s): VITAMINB12, FOLATE, FERRITIN, TIBC, IRON, RETICCTPCT in the last 72 hours. Urine analysis:    Component Value Date/Time   COLORURINE AMBER (A) 09/16/2018 1845   APPEARANCEUR CLOUDY (A) 09/16/2018 1845   LABSPEC 1.013 09/16/2018 1845   PHURINE 6.0 09/16/2018 1845   GLUCOSEU NEGATIVE 09/16/2018 1845   HGBUR LARGE (A) 09/16/2018 1845   BILIRUBINUR NEGATIVE 09/16/2018 1845    KETONESUR NEGATIVE 09/16/2018 1845   PROTEINUR NEGATIVE 09/16/2018 1845   UROBILINOGEN 0.2 07/03/2015 0618   NITRITE NEGATIVE 09/16/2018 1845   LEUKOCYTESUR SMALL (A) 09/16/2018 1845   Sepsis Labs: @LABRCNTIP (procalcitonin:4,lacticidven:4)  ) Recent Results (from the past 240 hour(s))  Blood culture (routine x 2)     Status: None (Preliminary result)   Collection Time: 09/16/18  4:29 PM  Result Value Ref Range Status   Specimen Description BLOOD LEFT FOREARM  Final   Special Requests   Final    BOTTLES DRAWN AEROBIC AND ANAEROBIC Blood Culture adequate volume   Culture   Final    NO GROWTH 4 DAYS Performed at [facility], [address] [place], [place] [address]    Report Status PENDING  Incomplete  Blood culture (routine x 2)     Status: None (Preliminary result)   Collection Time: 09/16/18  4:29 PM  Result Value Ref Range Status   Specimen Description BLOOD RIGHT HAND  Final   Special Requests   Final    BOTTLES DRAWN AEROBIC ONLY Blood Culture adequate volume   Culture   Final    NO GROWTH 4 DAYS Performed at [facility], [address] [place], [place]  [address]    Report Status PENDING  Incomplete  Urine culture     Status: None   Collection Time: 09/16/18  5:33 PM  Result Value Ref Range Status   Specimen Description URINE, CLEAN CATCH  Final   Special Requests NONE  Final   Culture   Final    NO GROWTH Performed at [facility], [address] [place], [place] [address]    Report Status 09/18/2018 FINAL  Final  Respiratory Panel by PCR     Status: None   Collection Time: 09/16/18  7:40 PM  Result Value Ref Range Status   Adenovirus NOT DETECTED NOT DETECTED Final   Coronavirus 229E NOT DETECTED NOT DETECTED Final   Coronavirus HKU1 NOT DETECTED NOT DETECTED Final   Coronavirus NL63 NOT DETECTED NOT DETECTED Final   Coronavirus OC43 NOT DETECTED NOT DETECTED Final   Metapneumovirus NOT DETECTED NOT DETECTED Final   Rhinovirus /  Enterovirus NOT DETECTED NOT DETECTED Final   Influenza A NOT DETECTED NOT DETECTED Final   Influenza B NOT DETECTED NOT DETECTED Final   Parainfluenza Virus 1 NOT DETECTED NOT DETECTED Final   Parainfluenza Virus 2 NOT DETECTED NOT DETECTED Final   Parainfluenza Virus 3 NOT DETECTED NOT DETECTED Final   Parainfluenza Virus 4 NOT DETECTED NOT DETECTED Final   Respiratory Syncytial Virus NOT DETECTED NOT DETECTED Final   Bordetella pertussis NOT DETECTED NOT DETECTED Final   Chlamydophila pneumoniae NOT DETECTED NOT DETECTED Final   Mycoplasma pneumoniae NOT DETECTED NOT DETECTED Final    Comment: Performed at [facility], [address] [place], [place] [address]  MRSA PCR Screening     Status: None   Collection Time: 09/17/18  7:27 AM  Result Value Ref Range Status   MRSA by PCR NEGATIVE NEGATIVE Final    Comment:        The GeneXpert MRSA Assay (FDA approved for NASAL specimens only), is one component of a comprehensive MRSA colonization surveillance program. It is not intended to diagnose MRSA infection nor to guide or monitor treatment for MRSA infections. Performed at [facility], [address], [place], [place] [address]       Studies: No results found.  Scheduled Meds: . buprenorphine-naloxone  1 tablet Sublingual BID  . furosemide  40 mg Oral Daily  . heparin  5,000 Units Subcutaneous Q8H  . lurasidone  20 mg Oral Q supper  . nicotine  21 mg Transdermal Daily  . potassium chloride  40 mEq Oral Daily  . traZODone  50 mg Oral QHS  . venlafaxine XR  150 mg Oral Q breakfast    Continuous Infusions: . ceFEPime (MAXIPIME) IV Stopped (09/20/18 6333)  . sodium chloride       LOS: 4 days     Darlin Drop, MD [facility] Pager [phone]  If 7PM-7AM, please contact night-coverage [email or web address] Password [email or web address] 09/20/2018, 1:10 PM

## 2018-09-20 NOTE — Progress Notes (Signed)
Pharmacy Antibiotic Note  Judy Robles is a 43 y.o. female admitted on 09/16/2018 with sepsis.  Pharmacy has been consulted for Vancomycin and Cefepime dosing.  On broad abx for sepsis secondary to bilateral lower lobe PNA per CXR. Day # 5 Cefepime: Afebrile, Tmax 98.9, WBC improved/ within normal.  Blood cultures no growth to date.   Plan: Continue cefepime 1g IV q8hr Monitor renal function, C&S, and vanc levels as appropriate  Height: 5\' 3"  (160 cm) Weight: 289 lb (131.1 kg) IBW/kg (Calculated) : 52.4  Temp (24hrs), Avg:98.5 F (36.9 C), Min:98.2 F (36.8 C), Max:98.9 F (37.2 C)  Recent Labs  Lab 09/16/18 1535 09/16/18 1636 09/16/18 1725 09/17/18 0206 09/18/18 0308 09/19/18 0253 09/20/18 0221  WBC  --  12.6*  --   --  10.3 8.2 8.1  CREATININE 1.00  --   --  0.99 1.01* 0.95 0.88  LATICACIDVEN 1.8  --  1.4  --   --   --   --     Estimated Creatinine Clearance: 108.1 mL/min (by C-G formula based on SCr of 0.88 mg/dL).    Allergies  Allergen Reactions  . Tylenol [Acetaminophen] Itching and Other (See Comments)    Welts  . Lisinopril Other (See Comments)    Shuts down kidneys    Antimicrobials this admission: Cefepime 1/26>> Vanc 1/26>1/27 Ceftriaxone 1/26 x1 Azithromycin 1/26 x1   Microbiology: 1/26: BCx x2 : ngtd 1/26 UCx : negative 1/26 Resp PCR:  negative 1/27 MRSA PCR: negative  Thank you for allowing pharmacy to be a part of this patient's care.  Noah Delaine, RPh Clinical Pharmacist Phone number [phone] Please check AMION for all [facility] Pharmacy phone numbers After 10:00 PM, call Main Pharmacy [phone] 09/20/2018 2:55 PM

## 2018-09-21 ENCOUNTER — Inpatient Hospital Stay (HOSPITAL_COMMUNITY): Payer: Medicaid Other

## 2018-09-21 LAB — CULTURE, BLOOD (ROUTINE X 2)
Culture: NO GROWTH
Culture: NO GROWTH
Special Requests: ADEQUATE
Special Requests: ADEQUATE

## 2018-09-21 MED ORDER — CEFDINIR 300 MG PO CAPS: 300.0000 mg | ORAL_CAPSULE | Freq: Two times a day (BID) | ORAL | Status: DC

## 2018-09-21 NOTE — Progress Notes (Signed)
PROGRESS NOTE  Judy Robles GEX:[id number] DOB: June 01, 1976 DOA: 09/16/2018 PCP: Jearld Lesch, MD  HPI/Recap of past 24 hours: This is a chronically ill homeless female with history of chronic diastolic CHF, COPD, bipolar disorder, polysubstance abuse, history of narcotic dependence now on Suboxone, was just discharged from [facility] after hospitalization for CHF exacerbation, she was apparently also let go from her shelter in [place] because of her Suboxone use, presented to the emergency room yesterday evening with increasing shortness of breath, productive cough, fever.  Admits poor compliance with all her medications. -CXR was notable for bilateral lower lobe pneumonia.  09/20/2018: Patient seen and examined at bedside.  No acute events overnight.  She states she is breathing better.  Independently reviewed chest x-ray which revealed significant bilateral pleural infiltrates affecting lower lobes.  Reports dyspnea with minimal exertion.  09/21/18: Still hypoxic and dyspnea with minimal exertion.  Repeat chest x-ray and obtain home O2 evaluation to assess oxygen requirement on ambulation.  Assessment/Plan: Principal Problem:   Sepsis (HCC) Active Problems:   Primary hypertension   Chronic pain   Polysubstance dependence including opioid type drug with complication, episodic abuse (HCC)   Bipolar II disorder (HCC)   Severe benzodiazepine use disorder (HCC)   HCAP (healthcare-associated pneumonia)   LLL pneumonia (HCC)   Hyponatremia   Hypokalemia  Sepsis secondary to multifocal pneumonia Sepsis criteria is resolving  Independently reviewed chest x-ray done on admission which revealed significant infiltrates involving lower lobes bilaterally. Completed 5 days of IV cefepime Continue around-the-clock breathing treatments Maintain O2 saturation greater than 92% Obtain home O2 evaluation  Acute hypoxic respiratory failure secondary to multifocal  pneumonia -Management as stated above  -Due to worsening multifocal pneumonia, repeat chest x-ray -also has untreated sleep apnea which is difficult to manage with current homelessness  Chronic pain/history of narcotic dependence, heroin abuse -Continue Suboxone -She was treated with gabapentin while at [facility] -Case management consulted  Bipolar disorder -Continue SNRI  Hyponatremia -Improving with above management, resolved  Chronic diastolic CHF -Clinically appears close to euvolemic -Restarted oral Lasix 40 mg daily  Homelessness/substance abuse/current Suboxone use -Options limited, consult social work and case management  DVT prophylaxis:subcu Heparin 3 times daily Code Status: Full code Family Communication: No family at bedside Disposition Plan: shelter in few days  Consultants:  None   Objective: Vitals:   09/20/18 1635 09/20/18 1644 09/20/18 2318 09/21/18 0733  BP: (!) 104/42 (!) 95/55 (!) 101/42 117/68  Pulse: (!) 57  (!) 56 63  Resp: 18  18 20   Temp: 98 F (36.7 C)  98 F (36.7 C) (!) 97.3 F (36.3 C)  TempSrc: Oral  Oral Oral  SpO2: 97%  96% 94%  Weight:      Height:        Intake/Output Summary (Last 24 hours) at 09/21/2018 1512 Last data filed at 09/20/2018 2000 Gross per 24 hour  Intake -  Output 500 ml  Net -500 ml   Filed Weights   09/16/18 1448  Weight: 131.1 kg    Exam:  . General: 43 y.o. year-old female well developed well nourished in no acute distress.  Alert and oriented times 3. . Cardiovascular: Regular rate and rhythm with no rubs or gallops.  No JVD or thyromegaly . Respiratory: Mild rales at bases with no wheezes.  Good inspiratory effort.   . Abdomen: Soft nontender nondistended with normal bowel sounds x4 quadrants. . Musculoskeletal: Trace lower extremity edema. 2/4 pulses in all  4 extremities. Marland Kitchen. Psychiatry: Mood is appropriate for condition and setting   Data Reviewed: CBC: Recent Labs  Lab 09/16/18 1636  09/18/18 0308 09/19/18 0253 09/20/18 0221  WBC 12.6* 10.3 8.2 8.1  NEUTROABS 9.4*  --   --   --   HGB 9.6* 9.6* 9.8* 10.1*  HCT 29.6* 30.8* 31.1* 32.2*  MCV 100.7* 102.3* 101.3* 101.3*  PLT 196 197 207 229   Basic Metabolic Panel: Recent Labs  Lab 09/16/18 1535 09/17/18 0206 09/18/18 0308 09/19/18 0253 09/20/18 0221  NA 132* 135 137 138 138  K 3.3* 3.4* 4.2 3.5 3.7  CL 100 102 104 105 104  CO2 23 23 25 24 24   GLUCOSE 146* 172* 169* 98 97  BUN 10 8 8 9 15   CREATININE 1.00 0.99 1.01* 0.95 0.88  CALCIUM 8.2* 8.0* 8.0* 7.9* 7.9*   GFR: Estimated Creatinine Clearance: 108.1 mL/min (by C-G formula based on SCr of 0.88 mg/dL). Liver Function Tests: Recent Labs  Lab 09/16/18 1535 09/17/18 0206  AST 93* 100*  ALT 77* 80*  ALKPHOS 56 59  BILITOT 1.3* 1.5*  PROT 6.6 6.5  ALBUMIN 2.5* 2.3*   No results for input(s): LIPASE, AMYLASE in the last 168 hours. No results for input(s): AMMONIA in the last 168 hours. Coagulation Profile: Recent Labs  Lab 09/17/18 0206  INR 1.28   Cardiac Enzymes: No results for input(s): CKTOTAL, CKMB, CKMBINDEX, TROPONINI in the last 168 hours. BNP (last 3 results) No results for input(s): PROBNP in the last 8760 hours. HbA1C: No results for input(s): HGBA1C in the last 72 hours. CBG: No results for input(s): GLUCAP in the last 168 hours. Lipid Profile: No results for input(s): CHOL, HDL, LDLCALC, TRIG, CHOLHDL, LDLDIRECT in the last 72 hours. Thyroid Function Tests: No results for input(s): TSH, T4TOTAL, FREET4, T3FREE, THYROIDAB in the last 72 hours. Anemia Panel: No results for input(s): VITAMINB12, FOLATE, FERRITIN, TIBC, IRON, RETICCTPCT in the last 72 hours. Urine analysis:    Component Value Date/Time   COLORURINE AMBER (A) 09/16/2018 1845   APPEARANCEUR CLOUDY (A) 09/16/2018 1845   LABSPEC 1.013 09/16/2018 1845   PHURINE 6.0 09/16/2018 1845   GLUCOSEU NEGATIVE 09/16/2018 1845   HGBUR LARGE (A) 09/16/2018 1845    BILIRUBINUR NEGATIVE 09/16/2018 1845   KETONESUR NEGATIVE 09/16/2018 1845   PROTEINUR NEGATIVE 09/16/2018 1845   UROBILINOGEN 0.2 07/03/2015 0618   NITRITE NEGATIVE 09/16/2018 1845   LEUKOCYTESUR SMALL (A) 09/16/2018 1845   Sepsis Labs: @LABRCNTIP (procalcitonin:4,lacticidven:4)  ) Recent Results (from the past 240 hour(s))  Blood culture (routine x 2)     Status: None   Collection Time: 09/16/18  4:29 PM  Result Value Ref Range Status   Specimen Description BLOOD LEFT FOREARM  Final   Special Requests   Final    BOTTLES DRAWN AEROBIC AND ANAEROBIC Blood Culture adequate volume   Culture   Final    NO GROWTH 5 DAYS Performed at [facility], [address] [place], [place] [address]    Report Status 09/21/2018 FINAL  Final  Blood culture (routine x 2)     Status: None   Collection Time: 09/16/18  4:29 PM  Result Value Ref Range Status   Specimen Description BLOOD RIGHT HAND  Final   Special Requests   Final    BOTTLES DRAWN AEROBIC ONLY Blood Culture adequate volume   Culture   Final    NO GROWTH 5 DAYS Performed at [facility], [address]  [place], [place] [address]    Report Status 09/21/2018 FINAL  Final  Urine culture     Status: None   Collection Time: 09/16/18  5:33 PM  Result Value Ref Range Status   Specimen Description URINE, CLEAN CATCH  Final   Special Requests NONE  Final   Culture   Final    NO GROWTH Performed at [facility], [address] [place], [place] [address]    Report Status 09/18/2018 FINAL  Final  Respiratory Panel by PCR     Status: None   Collection Time: 09/16/18  7:40 PM  Result Value Ref Range Status   Adenovirus NOT DETECTED NOT DETECTED Final   Coronavirus 229E NOT DETECTED NOT DETECTED Final   Coronavirus HKU1 NOT DETECTED NOT DETECTED Final   Coronavirus NL63 NOT DETECTED NOT DETECTED Final   Coronavirus OC43 NOT DETECTED NOT DETECTED Final   Metapneumovirus NOT DETECTED NOT DETECTED Final   Rhinovirus /  Enterovirus NOT DETECTED NOT DETECTED Final   Influenza A NOT DETECTED NOT DETECTED Final   Influenza B NOT DETECTED NOT DETECTED Final   Parainfluenza Virus 1 NOT DETECTED NOT DETECTED Final   Parainfluenza Virus 2 NOT DETECTED NOT DETECTED Final   Parainfluenza Virus 3 NOT DETECTED NOT DETECTED Final   Parainfluenza Virus 4 NOT DETECTED NOT DETECTED Final   Respiratory Syncytial Virus NOT DETECTED NOT DETECTED Final   Bordetella pertussis NOT DETECTED NOT DETECTED Final   Chlamydophila pneumoniae NOT DETECTED NOT DETECTED Final   Mycoplasma pneumoniae NOT DETECTED NOT DETECTED Final    Comment: Performed at [facility], [address] [place], [place] [address]  MRSA PCR Screening     Status: None   Collection Time: 09/17/18  7:27 AM  Result Value Ref Range Status   MRSA by PCR NEGATIVE NEGATIVE Final    Comment:        The GeneXpert MRSA Assay (FDA approved for NASAL specimens only), is one component of a comprehensive MRSA colonization surveillance program. It is not intended to diagnose MRSA infection nor to guide or monitor treatment for MRSA infections. Performed at [facility], [address] [place], [place] [address]       Studies: No results found.  Scheduled Meds: . buprenorphine-naloxone  1 tablet Sublingual BID  . furosemide  40 mg Oral Daily  . heparin  5,000 Units Subcutaneous Q8H  . lurasidone  20 mg Oral Q supper  . nicotine  21 mg Transdermal Daily  . potassium chloride  40 mEq Oral Daily  . traZODone  50 mg Oral QHS  . venlafaxine XR  150 mg Oral Q breakfast    Continuous Infusions: . ceFEPime (MAXIPIME) IV 1 g (09/21/18 0542)  . sodium chloride       LOS: 5 days     Darlin Drop, MD [facility] Pager [phone]  If 7PM-7AM, please contact night-coverage [email or web address] Password [email or web address] 09/21/2018, 3:12 PM

## 2018-09-22 LAB — CBC
HCT: 34.6 % — ABNORMAL LOW (ref 36.0–46.0)
Hemoglobin: 11.1 g/dL — ABNORMAL LOW (ref 12.0–15.0)
MCH: 32.4 pg (ref 26.0–34.0)
MCHC: 32.1 g/dL (ref 30.0–36.0)
MCV: 100.9 fL — AB (ref 80.0–100.0)
Platelets: 278 10*3/uL (ref 150–400)
RBC: 3.43 MIL/uL — ABNORMAL LOW (ref 3.87–5.11)
RDW: 13.3 % (ref 11.5–15.5)
WBC: 6.1 10*3/uL (ref 4.0–10.5)
nRBC: 0 % (ref 0.0–0.2)

## 2018-09-22 LAB — BASIC METABOLIC PANEL
Anion gap: 13 (ref 5–15)
BUN: 14 mg/dL (ref 6–20)
CO2: 23 mmol/L (ref 22–32)
Calcium: 8.6 mg/dL — ABNORMAL LOW (ref 8.9–10.3)
Chloride: 101 mmol/L (ref 98–111)
Creatinine, Ser: 0.89 mg/dL (ref 0.44–1.00)
GFR calc Af Amer: >60 mL/min (ref >60)
GFR calc non Af Amer: >60 mL/min (ref >60)
Glucose, Bld: 105 mg/dL — ABNORMAL HIGH (ref 70–99)
Potassium: 4.2 mmol/L (ref 3.5–5.1)
Sodium: 137 mmol/L (ref 135–145)

## 2018-09-22 MED ORDER — FUROSEMIDE 40 MG PO TABS: 40.0000 mg | ORAL_TABLET | Freq: Every day | ORAL | 0 refills | Status: DC

## 2018-09-22 MED ORDER — POTASSIUM CHLORIDE CRYS ER 20 MEQ PO TBCR: 20.0000 meq | EXTENDED_RELEASE_TABLET | Freq: Every day | ORAL | 0 refills | Status: DC

## 2018-09-22 MED ORDER — POLYETHYLENE GLYCOL 3350 17 G PO PACK: 17.0000 g | PACK | Freq: Every day | ORAL | 0 refills | Status: DC | PRN

## 2018-09-22 NOTE — Discharge Summary (Addendum)
Discharge Summary  PADEE WADEL WUJ:[id number] DOB: [birth year]  PCP: Jearld Lesch, MD  Admit date: 09/16/2018 Discharge date: 09/22/2018  Time spent: 35 minutes  Recommendations for Outpatient Follow-up:  1. Follow-up with your PCP 2. Please have your PCP give a referral to pulmonology for obstructive sleep apnea evaluation 3. Take your medications as prescribed 4. Continue physical therapy 5. Fall precautions  Discharge Diagnoses:  Active Hospital Problems   Diagnosis Date Noted  . Sepsis (HCC) 09/16/2018  . HCAP (healthcare-associated pneumonia) 09/16/2018  . LLL pneumonia (HCC) 09/16/2018  . Hyponatremia 09/16/2018  . Hypokalemia 09/16/2018  . Severe benzodiazepine use disorder (HCC) 07/10/2018  . Bipolar II disorder (HCC)   . Polysubstance dependence including opioid type drug with complication, episodic abuse (HCC) 07/21/2016  . Chronic pain 07/03/2015  . Primary hypertension 04/17/2014    Resolved Hospital Problems  No resolved problems to display.    Discharge Condition: Stable  Diet recommendation: Resume previous diet  Vitals:   09/21/18 2242 09/22/18 0842  BP: (!) 112/50 103/72  Pulse: 60 (!) 54  Resp: 18 12  Temp: 98 F (36.7 C) (!) 97.5 F (36.4 C)  SpO2: 95% 98%    History of present illness:  This is a chronically ill homeless female with history of chronic diastolic CHF, COPD, bipolar disorder, polysubstance abuse, history of narcotic dependence now on Suboxone, was just discharged from [facility] after hospitalization for CHF exacerbation, she was apparently also let go from her shelter in [place] because of her Suboxone use, presented to the emergency room yesterday evening with increasing shortness of breath, productive cough, fever. Admits poor compliance with all her medications. -CXR was notable for bilateral lower lobe pneumonia.  Hospital course complicated by dyspnea with minimal exertion.  Repeat chest x-ray which  revealed improvement in bilateral pleural infiltrates.  09/22/2018: Patient seen and examined at bedside.  No acute events overnight.  She has no new complaints.  She passed a home O2 evaluation with saturation greater than 90% on room air during ambulation.  On the day of discharge, the patient was hemodynamically stable.  She will need to follow-up with her primary care provider posthospitalization and take her medications as prescribed.    Hospital Course:  Principal Problem:   Sepsis (HCC) Active Problems:   Primary hypertension   Chronic pain   Polysubstance dependence including opioid type drug with complication, episodic abuse (HCC)   Bipolar II disorder (HCC)   Severe benzodiazepine use disorder (HCC)   HCAP (healthcare-associated pneumonia)   LLL pneumonia (HCC)   Hyponatremia   Hypokalemia  Resolving sepsis secondary to multifocal HCAP, poa Sepsis criteria is resolving  Independently reviewed chest x-ray done on admission which revealed significant infiltrates involving lower lobes bilaterally. Independent review chest x-ray repeated on 09/21/18 which revealed improvement in bilateral pulmonary infiltrates. Completed 5 days of IV cefepime Passed home O2 evaluation with saturation greater than 90% on room air during ambulation Follow-up with your primary care provider  Resolved acute hypoxic respiratory failure secondary to multifocal HCAP -Also has untreated sleep apnea which is difficult to manage with current homelessness -Please have your PCP give a referral to pulmonology to evaluate for obstructive sleep apnea  Chronic pain/history of narcotic dependence, heroin abuse -Continue Suboxone  Bipolar disorder -Continue SNRI  Resolved hyponatremia -Improving with above management, resolved  Chronic diastolic CHF -Clinically appears close to euvolemic -Continue oral Lasix 40 mg daily -Continue KCl 20 mg daily when taking diuretics  Homelessness/substance  abuse/current Suboxone use -Social work and case management consulted  Severe obesity with BMI 51 Recommend weight loss outpatient  Code Status:Full code   Consultants: None   Discharge Exam: BP 103/72 (BP Location: Left Arm)   Pulse (!) 54   Temp (!) 97.5 F (36.4 C) (Oral)   Resp 12   Ht 5\' 3"  (1.6 m)   Wt 131.1 kg   SpO2 98%   BMI 51.19 kg/m  . General: 43 y.o. year-old female well developed well nourished in no acute distress.  Alert and oriented x3. . Cardiovascular: Regular rate and rhythm with no rubs or gallops.  No thyromegaly or JVD noted.   Marland Kitchen Respiratory: Clear to auscultation with no wheezes or rales. Good inspiratory effort. . Abdomen: Soft nontender nondistended with normal bowel sounds x4 quadrants. . Musculoskeletal: No lower extremity edema. 2/4 pulses in all 4 extremities. . Skin: No ulcerative lesions noted or rashes, . Psychiatry: Mood is appropriate for condition and setting  Discharge Instructions You were cared for by a hospitalist during your hospital stay. If you have any questions about your discharge medications or the care you received while you were in the hospital after you are discharged, you can call the unit and asked to speak with the hospitalist on call if the hospitalist that took care of you is not available. Once you are discharged, your primary care physician will handle any further medical issues. Please note that NO REFILLS for any discharge medications will be authorized once you are discharged, as it is imperative that you return to your primary care physician (or establish a relationship with a primary care physician if you do not have one) for your aftercare needs so that they can reassess your need for medications and monitor your lab values.   Allergies as of 09/22/2018      Reactions   Tylenol [acetaminophen] Itching, Other (See Comments)   Welts   Lisinopril Other (See Comments)   Shuts down kidneys      Medication List     STOP taking these medications   gabapentin 300 MG capsule Commonly known as:  NEURONTIN     TAKE these medications   Buprenorphine HCl-Naloxone HCl 8-2 MG Film Place 1 Film under the tongue 2 (two) times daily.   furosemide 40 MG tablet Commonly known as:  LASIX Take 1 tablet (40 mg total) by mouth daily. Start taking on:  September 23, 2018   lurasidone 20 MG Tabs tablet Commonly known as:  LATUDA Take 1 tablet (20 mg total) by mouth daily with supper. For mood control   nicotine 21 mg/24hr patch Commonly known as:  NICODERM CQ - dosed in mg/24 hours Place 1 patch (21 mg total) onto the skin daily. (May purchase from over the counter): For smoking cessation   polyethylene glycol packet Commonly known as:  MIRALAX / GLYCOLAX Take 17 g by mouth daily as needed for mild constipation.   potassium chloride SA 20 MEQ tablet Commonly known as:  K-DUR,KLOR-CON Take 1 tablet (20 mEq total) by mouth daily. Start taking on:  September 23, 2018   traZODone 50 MG tablet Commonly known as:  DESYREL Take 1 tablet (50 mg total) by mouth at bedtime as needed for sleep. What changed:  when to take this   venlafaxine XR 150 MG 24 hr capsule Commonly known as:  EFFEXOR-XR Take 1 capsule (150 mg total) by mouth daily with breakfast. For depression      Allergies  Allergen Reactions  .  Tylenol [Acetaminophen] Itching and Other (See Comments)    Welts  . Lisinopril Other (See Comments)    Shuts down kidneys   Follow-up Information    [facility] Follow up.   Why:  Appointment: October 15, 2018 @1 :50pm Contact information: [place] [address] [phone]           The results of significant diagnostics from this hospitalization (including imaging, microbiology, ancillary and laboratory) are listed below for reference.    Significant Diagnostic Studies: X-ray Chest Pa And Lateral  Result Date:  09/17/2018 CLINICAL DATA:  Shortness of breath.  Pneumonia EXAM: CHEST - 2 VIEW COMPARISON:  Yesterday FINDINGS: Extensive bilateral airspace disease with mild progression on the right in the suprahilar lung. Stable borderline heart size. No effusion or pneumothorax. IMPRESSION: Extensive pneumonia with mild progression since yesterday. Electronically Signed   By: Marnee Spring M.D.   On: 09/17/2018 11:05   Dg Chest Port 1 View  Result Date: 09/21/2018 CLINICAL DATA:  Shortness of breath.  Cough. EXAM: PORTABLE CHEST 1 VIEW COMPARISON:  09/17/2018. FINDINGS: Heart size normal. Diffuse bilateral pulmonary infiltrates/edema, improved from prior study of 09/17/2018. No pleural effusion or pneumothorax. IMPRESSION: Diffuse bilateral pulmonary infiltrates/edema, improved from prior study of 09/17/2018. Continued follow-up exams demonstrate clearing suggested Electronically Signed   By: Maisie Fus  Register   On: 09/21/2018 16:23   Dg Chest Portable 1 View  Result Date: 09/16/2018 CLINICAL DATA:  Shortness of breath over the last 2 days. EXAM: PORTABLE CHEST 1 VIEW COMPARISON:  07/07/2018 FINDINGS: Artifact overlies the chest. There is airspace filling in both lower lobes, more extensive on the left than the right, consistent with bilateral lower lobe pneumonia. Upper lobes are clear. No visible effusion. No acute bone finding. IMPRESSION: Bilateral lower lobe pneumonia, left worse than right. Electronically Signed   By: Paulina Fusi M.D.   On: 09/16/2018 14:59    Microbiology: Recent Results (from the past 240 hour(s))  Blood culture (routine x 2)     Status: None   Collection Time: 09/16/18  4:29 PM  Result Value Ref Range Status   Specimen Description BLOOD LEFT FOREARM  Final   Special Requests   Final    BOTTLES DRAWN AEROBIC AND ANAEROBIC Blood Culture adequate volume   Culture   Final    NO GROWTH 5 DAYS Performed at [facility], [address] [place], [place] [address]    Report  Status 09/21/2018 FINAL  Final  Blood culture (routine x 2)     Status: None   Collection Time: 09/16/18  4:29 PM  Result Value Ref Range Status   Specimen Description BLOOD RIGHT HAND  Final   Special Requests   Final    BOTTLES DRAWN AEROBIC ONLY Blood Culture adequate volume   Culture   Final    NO GROWTH 5 DAYS Performed at [facility], [address] [place], [place] [address]    Report Status 09/21/2018 FINAL  Final  Urine culture     Status: None   Collection Time: 09/16/18  5:33 PM  Result Value Ref Range Status   Specimen Description URINE, CLEAN CATCH  Final   Special Requests NONE  Final   Culture   Final    NO GROWTH Performed at [facility], [address], [place], [place] [address]    Report Status 09/18/2018 FINAL  Final  Respiratory Panel by PCR  Status: None   Collection Time: 09/16/18  7:40 PM  Result Value Ref Range Status   Adenovirus NOT DETECTED NOT DETECTED Final   Coronavirus 229E NOT DETECTED NOT DETECTED Final   Coronavirus HKU1 NOT DETECTED NOT DETECTED Final   Coronavirus NL63 NOT DETECTED NOT DETECTED Final   Coronavirus OC43 NOT DETECTED NOT DETECTED Final   Metapneumovirus NOT DETECTED NOT DETECTED Final   Rhinovirus / Enterovirus NOT DETECTED NOT DETECTED Final   Influenza A NOT DETECTED NOT DETECTED Final   Influenza B NOT DETECTED NOT DETECTED Final   Parainfluenza Virus 1 NOT DETECTED NOT DETECTED Final   Parainfluenza Virus 2 NOT DETECTED NOT DETECTED Final   Parainfluenza Virus 3 NOT DETECTED NOT DETECTED Final   Parainfluenza Virus 4 NOT DETECTED NOT DETECTED Final   Respiratory Syncytial Virus NOT DETECTED NOT DETECTED Final   Bordetella pertussis NOT DETECTED NOT DETECTED Final   Chlamydophila pneumoniae NOT DETECTED NOT DETECTED Final   Mycoplasma pneumoniae NOT DETECTED NOT DETECTED Final    Comment: Performed at [facility], [address] [place], [place] [address]  MRSA PCR Screening     Status: None    Collection Time: 09/17/18  7:27 AM  Result Value Ref Range Status   MRSA by PCR NEGATIVE NEGATIVE Final    Comment:        The GeneXpert MRSA Assay (FDA approved for NASAL specimens only), is one component of a comprehensive MRSA colonization surveillance program. It is not intended to diagnose MRSA infection nor to guide or monitor treatment for MRSA infections. Performed at [facility], [address], [place], [place] [address]      Labs: Basic Metabolic Panel: Recent Labs  Lab 09/17/18 0206 09/18/18 0308 09/19/18 0253 09/20/18 0221 09/22/18 0443  NA 135 137 138 138 137  K 3.4* 4.2 3.5 3.7 4.2  CL 102 104 105 104 101  CO2 23 25 24 24 23   GLUCOSE 172* 169* 98 97 105*  BUN 8 8 9 15 14   CREATININE 0.99 1.01* 0.95 0.88 0.89  CALCIUM 8.0* 8.0* 7.9* 7.9* 8.6*   Liver Function Tests: Recent Labs  Lab 09/16/18 1535 09/17/18 0206  AST 93* 100*  ALT 77* 80*  ALKPHOS 56 59  BILITOT 1.3* 1.5*  PROT 6.6 6.5  ALBUMIN 2.5* 2.3*   No results for input(s): LIPASE, AMYLASE in the last 168 hours. No results for input(s): AMMONIA in the last 168 hours. CBC: Recent Labs  Lab 09/16/18 1636 09/18/18 0308 09/19/18 0253 09/20/18 0221 09/22/18 0443  WBC 12.6* 10.3 8.2 8.1 6.1  NEUTROABS 9.4*  --   --   --   --   HGB 9.6* 9.6* 9.8* 10.1* 11.1*  HCT 29.6* 30.8* 31.1* 32.2* 34.6*  MCV 100.7* 102.3* 101.3* 101.3* 100.9*  PLT 196 197 207 229 278   Cardiac Enzymes: No results for input(s): CKTOTAL, CKMB, CKMBINDEX, TROPONINI in the last 168 hours. BNP: BNP (last 3 results) Recent Labs    09/16/18 1636  BNP 630.2*    ProBNP (last 3 results) No results for input(s): PROBNP in the last 8760 hours.  CBG: No results for input(s): GLUCAP in the last 168 hours.     Signed:  Darlin Drop, MD [facility] 09/22/2018, 12:05 PM

## 2018-09-22 NOTE — Progress Notes (Addendum)
CSW was consulted by RN Seward Grater for resources and transportation needs for pt.  CSW gave RN Seward Grater bus passes and resources for pt.  No further needs noted.  CSW signing off.  Budd Palmer LCSWA [phone]

## 2018-09-22 NOTE — Evaluation (Signed)
SATURATION QUALIFICATIONS: (This note is used to comply with regulatory documentation for home oxygen)  Patient Saturations on Room Air at Rest = 97%  Patient Saturations on Room Air while Ambulating = 91%  Patient Saturations on 2 Liters of oxygen while Ambulating = 98%  No home O2 needed.

## 2018-09-22 NOTE — Discharge Instructions (Signed)
Community-Acquired Pneumonia, Adult °Pneumonia is an infection of the lungs. There are different types of pneumonia. One type can develop while a person is in a hospital. A different type, called community-acquired pneumonia, develops in people who are not, or have not recently been, in the hospital or other health care facility. °What are the causes? ° °Pneumonia may be caused by bacteria, viruses, or funguses. Community-acquired pneumonia is often caused by Streptococcus pneumonia bacteria. These bacteria are often passed from one person to another by breathing in droplets from the cough or sneeze of an infected person. °What increases the risk? °The condition is more likely to develop in: °· People who have chronic diseases, such as chronic obstructive pulmonary disease (COPD), asthma, congestive heart failure, cystic fibrosis, diabetes, or kidney disease. °· People who have early-stage or late-stage HIV. °· People who have sickle cell disease. °· People who have had their spleen removed (splenectomy). °· People who have poor dental hygiene. °· People who have medical conditions that increase the risk of breathing in (aspirating) secretions their own mouth and nose. °· People who have a weakened immune system (immunocompromised). °· People who smoke. °· People who travel to areas where pneumonia-causing germs commonly exist. °· People who are around animal habitats or animals that have pneumonia-causing germs, including birds, bats, rabbits, cats, and farm animals. °What are the signs or symptoms? °Symptoms of this condition include: °· A dry cough. °· A wet (productive) cough. °· Fever. °· Sweating. °· Chest pain, especially when breathing deeply or coughing. °· Rapid breathing or difficulty breathing. °· Shortness of breath. °· Shaking chills. °· Fatigue. °· Muscle aches. °How is this diagnosed? °Your health care provider will take a medical history and perform a physical exam. You may also have other tests,  including: °· Imaging studies of your chest, including X-rays. °· Tests to check your blood oxygen level and other blood gases. °· Other tests on blood, mucus (sputum), fluid around your lungs (pleural fluid), and urine. °If your pneumonia is severe, other tests may be done to identify the specific cause of your illness. °How is this treated? °The type of treatment that you receive depends on many factors, such as the cause of your pneumonia, the medicines you take, and other medical conditions that you have. For most adults, treatment and recovery from pneumonia may occur at home. In some cases, treatment must happen in a hospital. Treatment may include: °· Antibiotic medicines, if the pneumonia was caused by bacteria. °· Antiviral medicines, if the pneumonia was caused by a virus. °· Medicines that are given by mouth or through an IV tube. °· Oxygen. °· Respiratory therapy. °Although rare, treating severe pneumonia may include: °· Mechanical ventilation. This is done if you are not breathing well on your own and you cannot maintain a safe blood oxygen level. °· Thoracentesis. This procedure removes fluid around one lung or both lungs to help you breathe better. °Follow these instructions at home: ° °· Take over-the-counter and prescription medicines only as told by your health care provider. °? Only take cough medicine if you are losing sleep. Understand that cough medicine can prevent your body’s natural ability to remove mucus from your lungs. °? If you were prescribed an antibiotic medicine, take it as told by your health care provider. Do not stop taking the antibiotic even if you start to feel better. °· Sleep in a semi-upright position at night. Try sleeping in a reclining chair, or place a few pillows under your head. °· Do not use tobacco products, including cigarettes, chewing tobacco, and e-cigarettes.   If you need help quitting, ask your health care provider.  Drink enough water to keep your urine  clear or pale yellow. This will help to thin out mucus secretions in your lungs. How is this prevented? There are ways that you can decrease your risk of developing community-acquired pneumonia. Consider getting a pneumococcal vaccine if:  You are older than 44 years of age.  You are older than 45 years of age and are undergoing cancer treatment, have chronic lung disease, or have other medical conditions that affect your immune system. Ask your health care provider if this applies to you. There are different types and schedules of pneumococcal vaccines. Ask your health care provider which vaccination option is best for you. You may also prevent community-acquired pneumonia if you take these actions:  Get an influenza vaccine every year. Ask your health care provider which type of influenza vaccine is best for you.  Go to the dentist on a regular basis.  Wash your hands often. Use hand sanitizer if soap and water are not available. Contact a health care provider if:  You have a fever.  You are losing sleep because you cannot control your cough with cough medicine. Get help right away if:  You have worsening shortness of breath.  You have increased chest pain.  Your sickness becomes worse, especially if you are an older adult or have a weakened immune system.  You cough up blood. This information is not intended to replace advice given to you by your health care provider. Make sure you discuss any questions you have with your health care provider. Document Released: 08/08/2005 Document Revised: 04/27/2017 Document Reviewed: 12/03/2014 Elsevier Interactive Patient Education  2019 [place] Pneumonia, Adult Pneumonia is an infection of the lungs. It causes swelling in the airways of the lungs. Mucus and fluid may also build up inside the airways. One type of pneumonia can happen while a person is in a hospital. A different type can happen when a person is  not in a hospital (community-acquired pneumonia).  What are the causes?  This condition is caused by germs (viruses, bacteria, or fungi). Some types of germs can be passed from one person to another. This can happen when you breathe in droplets from the cough or sneeze of an infected person. What increases the risk? You are more likely to develop this condition if you:  Have a long-term (chronic) disease, such as: ? Chronic obstructive pulmonary disease (COPD). ? Asthma. ? Cystic fibrosis. ? Congestive heart failure. ? Diabetes. ? Kidney disease.  Have HIV.  Have sickle cell disease.  Have had your spleen removed.  Do not take good care of your teeth and mouth (poor dental hygiene).  Have a medical condition that increases the risk of breathing in droplets from your own mouth and nose.  Have a weakened body defense system (immune system).  Are a smoker.  Travel to areas where the germs that cause this illness are common.  Are around certain animals or the places they live. What are the signs or symptoms?  A dry cough.  A wet (productive) cough.  Fever.  Sweating.  Chest pain. This often happens when breathing deeply or coughing.  Fast breathing or trouble breathing.  Shortness of breath.  Shaking chills.  Feeling tired (fatigue).  Muscle aches. How is this treated? Treatment for this condition depends on many things. Most adults can be treated at home. In some cases, treatment must happen in a  hospital. Treatment may include:  Medicines given by mouth or through an IV tube.  Being given extra oxygen.  Respiratory therapy. In rare cases, treatment for very bad pneumonia may include:  Using a machine to help you breathe.  Having a procedure to remove fluid from around your lungs. Follow these instructions at home: Medicines  Take over-the-counter and prescription medicines only as told by your doctor. ? Only take cough medicine if you are losing  sleep.  If you were prescribed an antibiotic medicine, take it as told by your doctor. Do not stop taking the antibiotic even if you start to feel better. General instructions   Sleep with your head and neck raised (elevated). You can do this by sleeping in a recliner or by putting a few pillows under your head.  Rest as needed. Get at least 8 hours of sleep each night.  Drink enough water to keep your pee (urine) pale yellow.  Eat a healthy diet that includes plenty of vegetables, fruits, whole grains, low-fat dairy products, and lean protein.  Do not use any products that contain nicotine or tobacco. These include cigarettes, e-cigarettes, and chewing tobacco. If you need help quitting, ask your doctor.  Keep all follow-up visits as told by your doctor. This is important. How is this prevented? A shot (vaccine) can help prevent pneumonia. Shots are often suggested for:  People older than 45 years of age.  People older than 45 years of age who: ? Are having cancer treatment. ? Have long-term (chronic) lung disease. ? Have problems with their body's defense system. You may also prevent pneumonia if you take these actions:  Get the flu (influenza) shot every year.  Go to the dentist as often as told.  Wash your hands often. If you cannot use soap and water, use hand sanitizer. Contact a doctor if:  You have a fever.  You lose sleep because your cough medicine does not help. Get help right away if:  You are short of breath and it gets worse.  You have more chest pain.  Your sickness gets worse. This is very serious if: ? You are an older adult. ? Your body's defense system is weak.  You cough up blood. Summary  Pneumonia is an infection of the lungs.  Most adults can be treated at home. Some will need treatment in a hospital.  Drink enough water to keep your pee pale yellow.  Get at least 8 hours of sleep each night. This information is not intended to replace  advice given to you by your health care provider. Make sure you discuss any questions you have with your health care provider. Document Released: 01/25/2008 Document Revised: 04/05/2018 Document Reviewed: 04/05/2018 Elsevier Interactive Patient Education  2019 [employer]   Hypoxemia Hypoxemia occurs when the blood does not contain enough oxygen. The body cannot work well when it does not have enough oxygen because every part of the body needs oxygen. Oxygen enters the lungs when we breathe in, then it travels to all parts of the body through the blood. Hypoxemia can develop suddenly or slowly. What are the causes? Common causes of this condition include:  Long-term (chronic) lung diseases, such as chronic obstructive pulmonary disease (COPD) or interstitial lung disease.  Disorders that affect breathing at night, such as sleep apnea.  Fluid buildup in the lungs (pulmonary edema).  Lung infection (pneumonia).  Lung or throat cancer.  Abnormal blood flow that bypasses the lungs (having a shunt).  Certain  diseasesthat affect nerves or muscles.  A collapsed lung (pneumothorax).  A blood clot in the lungs (pulmonary embolus).  Certain types of heart disease.  Slow or shallow breathing (hypoventilation).  Certain medicines.  High altitudes.  Toxic chemicals, smoke, and gases. What are the signs or symptoms? In some cases, there may be no symptoms of this condition. If you do have symptoms, they may include:  Shortness of breath (dyspnea).  Bluish color of the skin, lips, or nail beds.  Breathing that is fast, noisy, or shallow.  A fast heartbeat.  Feeling tired or sleepy.  Feeling confused or worried. If hypoxemia develops quickly, you will likely have dyspnea. If hypoxemia develops slowly over months or years, you may not notice any symptoms. How is this diagnosed? This condition is diagnosed by:  A physical exam.  Blood tests.  A test that measures the  percentage of oxygen in your blood (pulse oximetry). This is done with a sensor that is placed on your finger, toe, or earlobe. How is this treated?  Treatment for this condition depends on the underlying cause of your hypoxemia. You will likely be treated with oxygen therapy to restore your blood oxygen level. Depending on the cause of your hypoxemia, you may need oxygen therapy for a short time (weeks or months), or you may need it for the rest of your life. Your health care provider may also recommend other therapies to treat the underlying cause of your hypoxemia. Follow these instructions at home:   Take over-the-counter and prescription medicines only as told by your health care provider.  If you are on oxygen therapy, follow oxygen safety precautions as directed by your health care provider. These may include: ? Always having a backup supply of oxygen. ? Not allowing anyone to smoke or have a fire around oxygen. ? Handling oxygen tanks carefully and as instructed.  Do not use any products that contain nicotine or tobacco, such as cigarettes and e-cigarettes. If you need help quitting, ask your health care provider. Stay away from people who smoke.  Keep all follow-up visits as told by your health care provider. This is important. Contact a health care provider if:  You have any concerns about your oxygen therapy.  You have trouble breathing, even during or after treatment.  You become short of breath when you exercise.  You are tired when you wake up.  You have a headache when you wake up. Get help right away if:  Your shortness of breath gets worse, especially with normal or minimal activity.  You have a bluish color of the skin, lips, or nail beds.  You become confused or you cannot think properly.  You cough up dark mucus or blood.  You have chest pain.  You have a fever. Summary  Hypoxemia occurs when the blood does not contain enough oxygen.  Hypoxemia may or  may not cause symptoms. Often, the main symptom is shortness of breath (dyspnea).  Depending on the cause of your hypoxemia, you may need oxygen therapy for a short time (weeks or months), or you may need it for the rest of your life.  If you are on oxygen therapy, follow oxygen safety precautions as directed by your health care provider. This information is not intended to replace advice given to you by your health care provider. Make sure you discuss any questions you have with your health care provider. Document Released: 02/21/2011 Document Revised: 07/12/2016 Document Reviewed: 07/12/2016 Elsevier Interactive Patient Education  2019  Elsevier Inc. ° °

## 2018-09-23 LAB — BASIC METABOLIC PANEL
Anion gap: 9 (ref 5–15)
BUN: 14 mg/dL (ref 6–20)
CO2: 25 mmol/L (ref 22–32)
Calcium: 8.8 mg/dL — ABNORMAL LOW (ref 8.9–10.3)
Chloride: 102 mmol/L (ref 98–111)
Creatinine, Ser: 1.02 mg/dL — ABNORMAL HIGH (ref 0.44–1.00)
GFR calc Af Amer: >60 mL/min (ref >60)
GFR calc non Af Amer: >60 mL/min (ref >60)
GLUCOSE: 100 mg/dL — AB (ref 70–99)
Potassium: 4.5 mmol/L (ref 3.5–5.1)
Sodium: 136 mmol/L (ref 135–145)

## 2018-09-23 LAB — CBC
HEMATOCRIT: 36.7 % (ref 36.0–46.0)
Hemoglobin: 11.6 g/dL — ABNORMAL LOW (ref 12.0–15.0)
MCH: 31.4 pg (ref 26.0–34.0)
MCHC: 31.6 g/dL (ref 30.0–36.0)
MCV: 99.5 fL (ref 80.0–100.0)
Platelets: 303 10*3/uL (ref 150–400)
RBC: 3.69 MIL/uL — ABNORMAL LOW (ref 3.87–5.11)
RDW: 13.1 % (ref 11.5–15.5)
WBC: 5.8 10*3/uL (ref 4.0–10.5)
nRBC: 0 % (ref 0.0–0.2)

## 2018-09-23 NOTE — Plan of Care (Signed)
  Problem: Education: Goal: Knowledge of General Education information will improve Description: Including pain rating scale, medication(s)/side effects and non-pharmacologic comfort measures Outcome: Progressing   Problem: Health Behavior/Discharge Planning: Goal: Ability to manage health-related needs will improve Outcome: Progressing   Problem: Clinical Measurements: Goal: Will remain free from infection Outcome: Progressing   Problem: Nutrition: Goal: Adequate nutrition will be maintained Outcome: Progressing   Problem: Pain Managment: Goal: General experience of comfort will improve Outcome: Progressing   

## 2018-10-15 ENCOUNTER — Inpatient Hospital Stay (INDEPENDENT_AMBULATORY_CARE_PROVIDER_SITE_OTHER): Payer: Self-pay | Admitting: Primary Care

## 2018-10-23 ENCOUNTER — Emergency Department (HOSPITAL_COMMUNITY)
Admission: EM | Admit: 2018-10-23 | Discharge: 2018-10-24 | Disposition: A | Discharge status: Home / Self Care | Payer: Medicaid Other | Attending: Emergency Medicine | Admitting: Emergency Medicine

## 2018-10-23 ENCOUNTER — Encounter (HOSPITAL_COMMUNITY): Payer: Self-pay | Admitting: Emergency Medicine

## 2018-10-23 ENCOUNTER — Other Ambulatory Visit: Payer: Self-pay

## 2018-10-23 DIAGNOSIS — F419 Anxiety disorder, unspecified: Secondary | ICD-10-CM | POA: Insufficient documentation

## 2018-10-23 DIAGNOSIS — F332 Major depressive disorder, recurrent severe without psychotic features: Secondary | ICD-10-CM | POA: Diagnosis not present

## 2018-10-23 DIAGNOSIS — I110 Hypertensive heart disease with heart failure: Secondary | ICD-10-CM | POA: Insufficient documentation

## 2018-10-23 DIAGNOSIS — Z79899 Other long term (current) drug therapy: Secondary | ICD-10-CM | POA: Insufficient documentation

## 2018-10-23 DIAGNOSIS — F1920 Other psychoactive substance dependence, uncomplicated: Secondary | ICD-10-CM | POA: Insufficient documentation

## 2018-10-23 DIAGNOSIS — E876 Hypokalemia: Secondary | ICD-10-CM | POA: Diagnosis not present

## 2018-10-23 DIAGNOSIS — E871 Hypo-osmolality and hyponatremia: Secondary | ICD-10-CM | POA: Diagnosis not present

## 2018-10-23 DIAGNOSIS — I5032 Chronic diastolic (congestive) heart failure: Secondary | ICD-10-CM | POA: Insufficient documentation

## 2018-10-23 DIAGNOSIS — R112 Nausea with vomiting, unspecified: Secondary | ICD-10-CM | POA: Diagnosis not present

## 2018-10-23 DIAGNOSIS — F17210 Nicotine dependence, cigarettes, uncomplicated: Secondary | ICD-10-CM | POA: Diagnosis not present

## 2018-10-23 DIAGNOSIS — F329 Major depressive disorder, single episode, unspecified: Secondary | ICD-10-CM

## 2018-10-23 DIAGNOSIS — F32A Depression, unspecified: Secondary | ICD-10-CM

## 2018-10-23 DIAGNOSIS — Z634 Disappearance and death of family member: Secondary | ICD-10-CM | POA: Diagnosis not present

## 2018-10-23 LAB — RAPID URINE DRUG SCREEN, HOSP PERFORMED
Amphetamines: NOT DETECTED
Barbiturates: NOT DETECTED
Benzodiazepines: NOT DETECTED
Cocaine: NOT DETECTED
Opiates: POSITIVE — AB
Tetrahydrocannabinol: NOT DETECTED

## 2018-10-23 LAB — URINALYSIS, ROUTINE W REFLEX MICROSCOPIC
Bacteria, UA: NONE SEEN
Bilirubin Urine: NEGATIVE
Glucose, UA: NEGATIVE mg/dL
Ketones, ur: NEGATIVE mg/dL
Nitrite: NEGATIVE
Protein, ur: NEGATIVE mg/dL
Specific Gravity, Urine: 1.003 — ABNORMAL LOW (ref 1.005–1.030)
pH: 6 (ref 5.0–8.0)

## 2018-10-23 LAB — COMPREHENSIVE METABOLIC PANEL
ALK PHOS: 83 U/L (ref 38–126)
ALT: 102 U/L — ABNORMAL HIGH (ref 0–44)
ANION GAP: 12 (ref 5–15)
AST: 131 U/L — ABNORMAL HIGH (ref 15–41)
Albumin: 3.1 g/dL — ABNORMAL LOW (ref 3.5–5.0)
BUN: 8 mg/dL (ref 6–20)
CO2: 20 mmol/L — ABNORMAL LOW (ref 22–32)
Calcium: 8 mg/dL — ABNORMAL LOW (ref 8.9–10.3)
Chloride: 96 mmol/L — ABNORMAL LOW (ref 98–111)
Creatinine, Ser: 1.06 mg/dL — ABNORMAL HIGH (ref 0.44–1.00)
GFR calc Af Amer: >60 mL/min (ref >60)
GFR calc non Af Amer: >60 mL/min (ref >60)
Glucose, Bld: 131 mg/dL — ABNORMAL HIGH (ref 70–99)
Potassium: 2.6 mmol/L — CL (ref 3.5–5.1)
Sodium: 128 mmol/L — ABNORMAL LOW (ref 135–145)
Total Bilirubin: 0.5 mg/dL (ref 0.3–1.2)
Total Protein: 7 g/dL (ref 6.5–8.1)

## 2018-10-23 LAB — ETHANOL: Alcohol, Ethyl (B): <10 mg/dL (ref <10)

## 2018-10-23 LAB — CBC
HCT: 39.3 % (ref 36.0–46.0)
Hemoglobin: 13 g/dL (ref 12.0–15.0)
MCH: 31.6 pg (ref 26.0–34.0)
MCHC: 33.1 g/dL (ref 30.0–36.0)
MCV: 95.4 fL (ref 80.0–100.0)
Platelets: 222 10*3/uL (ref 150–400)
RBC: 4.12 MIL/uL (ref 3.87–5.11)
RDW: 12.1 % (ref 11.5–15.5)
WBC: 12.1 10*3/uL — ABNORMAL HIGH (ref 4.0–10.5)
nRBC: 0 % (ref 0.0–0.2)

## 2018-10-23 LAB — I-STAT BETA HCG BLOOD, ED (MC, WL, AP ONLY): I-stat hCG, quantitative: <5 m[IU]/mL (ref <5)

## 2018-10-23 LAB — LIPASE, BLOOD: Lipase: 35 U/L (ref 11–51)

## 2018-10-23 LAB — ACETAMINOPHEN LEVEL: Acetaminophen (Tylenol), Serum: 10 ug/mL (ref 10–30)

## 2018-10-23 LAB — SALICYLATE LEVEL: Salicylate Lvl: <7 mg/dL (ref 2.8–30.0)

## 2018-10-23 MED ORDER — SODIUM CHLORIDE 0.9% FLUSH
3.0000 mL | Freq: Once | INTRAVENOUS | Status: AC
  Administered 2018-10-23: 3 mL via INTRAVENOUS

## 2018-10-23 MED ORDER — KETOROLAC TROMETHAMINE 15 MG/ML IJ SOLN
15.0000 mg | Freq: Once | INTRAMUSCULAR | Status: AC
  Administered 2018-10-23: 15 mg via INTRAVENOUS
  Filled 2018-10-23: qty 1

## 2018-10-23 MED ORDER — POTASSIUM CHLORIDE CRYS ER 20 MEQ PO TBCR
40.0000 meq | EXTENDED_RELEASE_TABLET | Freq: Once | ORAL | Status: AC
  Administered 2018-10-23: 40 meq via ORAL
  Filled 2018-10-23: qty 2

## 2018-10-23 MED ORDER — POTASSIUM CHLORIDE 10 MEQ/100ML IV SOLN
10.0000 meq | INTRAVENOUS | Status: AC
  Administered 2018-10-23 – 2018-10-24 (×5): 10 meq via INTRAVENOUS
  Filled 2018-10-23 (×5): qty 100

## 2018-10-23 MED ORDER — ONDANSETRON 4 MG PO TBDP: 4.0000 mg | ORAL_TABLET | Freq: Once | ORAL | Status: DC | PRN

## 2018-10-23 MED ORDER — CYCLOBENZAPRINE HCL 10 MG PO TABS
10.0000 mg | ORAL_TABLET | Freq: Once | ORAL | Status: AC
  Administered 2018-10-23: 10 mg via ORAL
  Filled 2018-10-23: qty 1

## 2018-10-23 MED ORDER — DICYCLOMINE HCL 10 MG/ML IM SOLN
20.0000 mg | Freq: Once | INTRAMUSCULAR | Status: AC
  Administered 2018-10-23: 20 mg via INTRAMUSCULAR
  Filled 2018-10-23: qty 2

## 2018-10-23 NOTE — ED Triage Notes (Addendum)
Patient brought in by [facility]. Patient is complaining of depression and anxiety. Patient has an increase depression because son died in 09-08-22. Patient has taken over 16 vicodins in the past two days. 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 Patient started abusing when she got them. Patient has been having vomiting episodes.

## 2018-10-23 NOTE — ED Notes (Signed)
Pt stating she is having an anxiety attack and needs pain meds now. Pt was told she would have to wait for EDP to see pt before any meds could be given.

## 2018-10-23 NOTE — ED Notes (Addendum)
Pt gave permission for [place] to contact and give [facility] [phone] information about her care. Code word: Apolinar Junes

## 2018-10-23 NOTE — ED Notes (Addendum)
Date and time results received: 10/23/18 2114 (use smartphrase ".now" to insert current time)  Test: K+ Critical Value: 2.6  Name of Provider Notified: schlossman  Orders Received? Or Actions Taken?: waiting on orders

## 2018-10-23 NOTE — ED Notes (Signed)
Pt ambulated to the bathroom without any assistance. Gait steady. Pt refusing to put socks or shoes on.

## 2018-10-24 LAB — BASIC METABOLIC PANEL
Anion gap: 9 (ref 5–15)
BUN: 9 mg/dL (ref 6–20)
CO2: 23 mmol/L (ref 22–32)
CREATININE: 1.04 mg/dL — AB (ref 0.44–1.00)
Calcium: 7.7 mg/dL — ABNORMAL LOW (ref 8.9–10.3)
Chloride: 100 mmol/L (ref 98–111)
GFR calc non Af Amer: >60 mL/min (ref >60)
Glucose, Bld: 104 mg/dL — ABNORMAL HIGH (ref 70–99)
Potassium: 3.2 mmol/L — ABNORMAL LOW (ref 3.5–5.1)
Sodium: 132 mmol/L — ABNORMAL LOW (ref 135–145)

## 2018-10-24 MED ORDER — NICOTINE 21 MG/24HR TD PT24: 21.0000 mg | MEDICATED_PATCH | Freq: Every day | TRANSDERMAL | Status: DC

## 2018-10-24 MED ORDER — SODIUM CHLORIDE 0.9 % IV BOLUS
1000.0000 mL | Freq: Once | INTRAVENOUS | Status: AC
  Administered 2018-10-24: 1000 mL via INTRAVENOUS

## 2018-10-24 MED ORDER — HYDROXYZINE HCL 25 MG PO TABS
50.0000 mg | ORAL_TABLET | Freq: Once | ORAL | Status: AC
  Administered 2018-10-24: 50 mg via ORAL
  Filled 2018-10-24: qty 2

## 2018-10-24 MED ORDER — CLONIDINE HCL 0.1 MG PO TABS: 0.1000 mg | ORAL_TABLET | Freq: Every day | ORAL | Status: DC

## 2018-10-24 MED ORDER — CLONIDINE HCL 0.1 MG PO TABS: 0.1000 mg | ORAL_TABLET | Freq: Two times a day (BID) | ORAL | Status: DC

## 2018-10-24 MED ORDER — CLONIDINE HCL 0.1 MG PO TABS
0.1000 mg | ORAL_TABLET | Freq: Four times a day (QID) | ORAL | Status: DC
  Filled 2018-10-24: qty 1

## 2018-10-24 MED ORDER — ONDANSETRON 4 MG PO TBDP: 4.0000 mg | ORAL_TABLET | Freq: Four times a day (QID) | ORAL | Status: DC | PRN

## 2018-10-24 MED ORDER — METHOCARBAMOL 500 MG PO TABS: 500.0000 mg | ORAL_TABLET | Freq: Three times a day (TID) | ORAL | Status: DC | PRN

## 2018-10-24 MED ORDER — DICYCLOMINE HCL 20 MG PO TABS: 20.0000 mg | ORAL_TABLET | Freq: Four times a day (QID) | ORAL | Status: DC | PRN

## 2018-10-24 MED ORDER — ALUM & MAG HYDROXIDE-SIMETH 200-200-20 MG/5ML PO SUSP: 30.0000 mL | Freq: Four times a day (QID) | ORAL | Status: DC | PRN

## 2018-10-24 MED ORDER — MAGNESIUM SULFATE 2 GM/50ML IV SOLN
2.0000 g | Freq: Once | INTRAVENOUS | Status: AC
  Administered 2018-10-24: 2 g via INTRAVENOUS
  Filled 2018-10-24: qty 50

## 2018-10-24 MED ORDER — LOPERAMIDE HCL 2 MG PO CAPS: 2.0000 mg | ORAL_CAPSULE | ORAL | Status: DC | PRN

## 2018-10-24 MED ORDER — NAPROXEN 500 MG PO TABS: 500.0000 mg | ORAL_TABLET | Freq: Two times a day (BID) | ORAL | Status: DC | PRN

## 2018-10-24 MED ORDER — ONDANSETRON HCL 4 MG PO TABS: 4.0000 mg | ORAL_TABLET | Freq: Three times a day (TID) | ORAL | Status: DC | PRN

## 2018-10-24 MED ORDER — POTASSIUM CHLORIDE CRYS ER 20 MEQ PO TBCR
40.0000 meq | EXTENDED_RELEASE_TABLET | Freq: Every day | ORAL | Status: DC
  Administered 2018-10-24: 40 meq via ORAL
  Filled 2018-10-24: qty 2

## 2018-10-24 MED ORDER — HYDROXYZINE HCL 25 MG PO TABS: 25.0000 mg | ORAL_TABLET | Freq: Four times a day (QID) | ORAL | Status: DC | PRN

## 2018-10-24 NOTE — BH Assessment (Addendum)
[facility] Assessment Progress Note  Per Juanetta Beets, DO, this pt does not require psychiatric hospitalization at this time.  Pt is to be discharged from [facility] with recommendation to follow up with [facility] [place] for general behavioral health needs, and with [facility] for grief counseling.  This has been included in pt's discharge instructions.  Pt's nurse, Kendal Hymen, has been notified.  Doylene Canning, MA Triage Specialist [phone]

## 2018-10-24 NOTE — ED Notes (Signed)
Pt was seen walking out of the side wooden doors towards the hallway, off duty is aware

## 2018-10-24 NOTE — ED Provider Notes (Signed)
Nursing notes and vitals signs, including pulse oximetry, reviewed.  Summary of this visit's results, reviewed by myself:  EKG:  EKG Interpretation  Date/Time:  Wednesday October 24 2018 00:26:50 EST Ventricular Rate:  95 PR Interval:    QRS Duration: 89 QT Interval:  394 QTC Calculation: 496 R Axis:   66 Text Interpretation:  Sinus rhythm Probable left atrial enlargement Borderline prolonged QT interval Rate is faster Confirmed by Oaklyn Mans, Jonny Ruiz ([id number]) on 10/24/2018 2:13:30 AM       Labs:  Results for orders placed or performed during the hospital encounter of 10/23/18 (from the past 24 hour(s))  Lipase, blood     Status: None   Collection Time: 10/23/18  8:18 PM  Result Value Ref Range   Lipase 35 11 - 51 U/L  Comprehensive metabolic panel     Status: Abnormal   Collection Time: 10/23/18  8:18 PM  Result Value Ref Range   Sodium 128 (L) 135 - 145 mmol/L   Potassium 2.6 (LL) 3.5 - 5.1 mmol/L   Chloride 96 (L) 98 - 111 mmol/L   CO2 20 (L) 22 - 32 mmol/L   Glucose, Bld 131 (H) 70 - 99 mg/dL   BUN 8 6 - 20 mg/dL   Creatinine, Ser 6.78 (H) 0.44 - 1.00 mg/dL   Calcium 8.0 (L) 8.9 - 10.3 mg/dL   Total Protein 7.0 6.5 - 8.1 g/dL   Albumin 3.1 (L) 3.5 - 5.0 g/dL   AST 938 (H) 15 - 41 U/L   ALT 102 (H) 0 - 44 U/L   Alkaline Phosphatase 83 38 - 126 U/L   Total Bilirubin 0.5 0.3 - 1.2 mg/dL   GFR calc non Af Amer >60 >60 mL/min   GFR calc Af Amer >60 >60 mL/min   Anion gap 12 5 - 15  CBC     Status: Abnormal   Collection Time: 10/23/18  8:18 PM  Result Value Ref Range   WBC 12.1 (H) 4.0 - 10.5 K/uL   RBC 4.12 3.87 - 5.11 MIL/uL   Hemoglobin 13.0 12.0 - 15.0 g/dL   HCT 10.1 75.1 - 02.5 %   MCV 95.4 80.0 - 100.0 fL   MCH 31.6 26.0 - 34.0 pg   MCHC 33.1 30.0 - 36.0 g/dL   RDW 85.2 77.8 - 24.2 %   Platelets 222 150 - 400 K/uL   nRBC 0.0 0.0 - 0.2 %  Urinalysis, Routine w reflex microscopic     Status: Abnormal   Collection Time: 10/23/18  8:18 PM  Result Value Ref Range   Color, Urine STRAW (A) YELLOW   APPearance CLEAR CLEAR   Specific Gravity, Urine 1.003 (L) 1.005 - 1.030   pH 6.0 5.0 - 8.0   Glucose, UA NEGATIVE NEGATIVE mg/dL   Hgb urine dipstick SMALL (A) NEGATIVE   Bilirubin Urine NEGATIVE NEGATIVE   Ketones, ur NEGATIVE NEGATIVE mg/dL   Protein, ur NEGATIVE NEGATIVE mg/dL   Nitrite NEGATIVE NEGATIVE   Leukocytes,Ua TRACE (A) NEGATIVE   RBC / HPF 0-5 0 - 5 RBC/hpf   WBC, UA 0-5 0 - 5 WBC/hpf   Bacteria, UA NONE SEEN NONE SEEN   Squamous Epithelial / LPF 0-5 0 - 5   Mucus PRESENT   Ethanol     Status: None   Collection Time: 10/23/18  8:18 PM  Result Value Ref Range   Alcohol, Ethyl (B) <10 <10 mg/dL  Salicylate level     Status: None   Collection Time: 10/23/18  8:18 PM  Result Value Ref Range   Salicylate Lvl <7.0 2.8 - 30.0 mg/dL  Acetaminophen level     Status: None   Collection Time: 10/23/18  8:18 PM  Result Value Ref Range   Acetaminophen (Tylenol), Serum 10 10 - 30 ug/mL  Rapid urine drug screen (hospital performed)     Status: Abnormal   Collection Time: 10/23/18  8:18 PM  Result Value Ref Range   Opiates POSITIVE (A) NONE DETECTED   Cocaine NONE DETECTED NONE DETECTED   Benzodiazepines NONE DETECTED NONE DETECTED   Amphetamines NONE DETECTED NONE DETECTED   Tetrahydrocannabinol NONE DETECTED NONE DETECTED   Barbiturates NONE DETECTED NONE DETECTED  I-Stat beta hCG blood, ED     Status: None   Collection Time: 10/23/18  8:36 PM  Result Value Ref Range   I-stat hCG, quantitative <5.0 <5 mIU/mL   Comment 3          Basic metabolic panel     Status: Abnormal   Collection Time: 10/24/18  3:33 AM  Result Value Ref Range   Sodium 132 (L) 135 - 145 mmol/L   Potassium 3.2 (L) 3.5 - 5.1 mmol/L   Chloride 100 98 - 111 mmol/L   CO2 23 22 - 32 mmol/L   Glucose, Bld 104 (H) 70 - 99 mg/dL   BUN 9 6 - 20 mg/dL   Creatinine, Ser 0.16 (H) 0.44 - 1.00 mg/dL   Calcium 7.7 (L) 8.9 - 10.3 mg/dL   GFR calc non Af Amer >60 >60 mL/min    GFR calc Af Amer >60 >60 mL/min   Anion gap 9 5 - 15      Renette Hsu, MD 10/24/18 [phone]

## 2018-10-24 NOTE — ED Notes (Signed)
Pt states she is in terrible pain, pt sitting up talking on phone, eating soup, in no distress. Pt previously denied all pain medication stating unless it was a narcotic nothing would work for her. Originally did not want to take atarax stating it will not help, but agreed to take it now. Pt now walking to the restroom independently stating "I dont know why people even come to the hospital, they are going to do whatever the fuck they want"  Pt brought back to bed and cooperative.

## 2018-10-24 NOTE — ED Notes (Signed)
Pt given more soup, crackers, and coke per request.

## 2018-10-24 NOTE — BH Assessment (Signed)
Assessment Note  Judy Robles is an 43 y.o. female presenting voluntarily to [facility] ED via EMS complaining of worsening depression since the passing of her son in January 2020. Patient has a history of opiate addiction. She reports taking 16 Vicodin in the past 2 days. Patient is drowsy during assessment and falling in and out of sleep. Patient denies any suicidal ideation or plan. However, has had numerous hospitalizations for suicidal ideation with a plan in the past. She denies HI/AVH. Patient was most recently hospitalized at [facility] in January and in November 2019. Patient is also complaining of worsening anxiety and requesting benzodiazepines.She is currently living at [facility]. Patient denies any current criminal charges. Patient received medication management from [facility].   Patient is drowsy and oriented x 2 during assessment. She is dressed in scrubs and sitting up in bed with her eyes closing. Her speech is soft and slurred. Her mood is depressed and affect is congruent. Her insight, judgement, and impulse control are poor. Patient does not appear to be responding to internal stimuli or experiencing delusional thought content.   Diagnosis: F33.2 MDD, recurrent, severe   Polysubstance use  Past Medical History:  Past Medical History:  Diagnosis Date  . Arthritis   . Bipolar 1 disorder (HCC)   . Depression   . Hypercholesterolemia   . Hypertension   . MRSA (methicillin resistant Staphylococcus aureus)   . Obesity   . Renal disease   . Sleep apnea     Past Surgical History:  Procedure Laterality Date  . CESAREAN SECTION    . FRACTURE SURGERY Right    ankle  . I&D EXTREMITY Right 04/16/2014   Procedure: IRRIGATION AND DEBRIDEMENT EXTREMITY;  Surgeon: Sharma Covert, MD;  Location: [facility] OR;  Service: Orthopedics;  Laterality: Right;    Family History:  Family History  Problem Relation Age of Onset  . Throat cancer Mother   . Hypertension Father   . Stroke Father     Social  History:  reports that she has been smoking cigarettes. She has been smoking about 1.00 pack per day. She has never used smokeless tobacco. She reports current alcohol use. She reports current drug use. Drug: Cocaine.  Additional Social History:  Alcohol / Drug Use Pain Medications: see MAR Prescriptions: see MAR Over the Counter: see MAR History of alcohol / drug use?: Yes Longest period of sobriety (when/how long): 2 years Substance #1 Name of Substance 1: Opiates 1 - Age of First Use: 33 1 - Amount (size/oz): varies 1 - Frequency: varies 1 - Duration: 7 years 1 - Last Use / Amount: 2 years ago  CIWA: CIWA-Ar BP: 116/84 Pulse Rate: 87 COWS:    Allergies:  Allergies  Allergen Reactions  . Tylenol [Acetaminophen] Itching and Other (See Comments)    Welts  . Lisinopril Other (See Comments)    Shuts down kidneys    Home Medications: (Not in a hospital admission)   OB/GYN Status:  No LMP recorded. (Menstrual status: Irregular Periods).  General Assessment Data Assessment unable to be completed: Yes Reason for not completing assessment: (multiple assessments) Location of Assessment: [facility] ED TTS Assessment: In system Is this a Tele or Face-to-Face Assessment?: Face-to-Face Is this an Initial Assessment or a Re-assessment for this encounter?: Initial Assessment Patient Accompanied by:: N/A Language Other than English: No Living Arrangements: Other (Comment)([facility]) What gender do you identify as?: Female Marital status: Single Maiden name: Carland Pregnancy Status: No Living Arrangements: Group Home Can pt  return to current living arrangement?: Yes Admission Status: Voluntary Is patient capable of signing voluntary admission?: Yes Referral Source: Self/Family/Friend Insurance type: Medicaid     Crisis Care Plan Living Arrangements: Group Home Legal Guardian: Other:(self) Name of Psychiatrist: in [place] Name of Therapist: none  Education Status Is patient  currently in school?: No Is the patient employed, unemployed or receiving disability?: Receiving disability income  Risk to self with the past 6 months Suicidal Ideation: No Has patient been a risk to self within the past 6 months prior to admission? : Yes Suicidal Intent: No-Not Currently/Within Last 6 Months Has patient had any suicidal intent within the past 6 months prior to admission? : Yes Is patient at risk for suicide?: No Suicidal Plan?: No-Not Currently/Within Last 6 Months Has patient had any suicidal plan within the past 6 months prior to admission? : Yes Access to Means: No What has been your use of drugs/alcohol within the last 12 months?: cocaine Previous Attempts/Gestures: No How many times?: 0 Other Self Harm Risks: none noted Triggers for Past Attempts: None known Intentional Self Injurious Behavior: None Family Suicide History: No Recent stressful life event(s): Loss (Comment)(son passed in January) Persecutory voices/beliefs?: No Depression: Yes Depression Symptoms: Despondent, Insomnia, Tearfulness, Isolating, Fatigue, Guilt, Loss of interest in usual pleasures, Feeling worthless/self pity, Feeling angry/irritable Substance abuse history and/or treatment for substance abuse?: Yes Suicide prevention information given to non-admitted patients: Not applicable  Risk to Others within the past 6 months Homicidal Ideation: No Does patient have any lifetime risk of violence toward others beyond the six months prior to admission? : No Thoughts of Harm to Others: No Current Homicidal Intent: No Current Homicidal Plan: No Access to Homicidal Means: No Identified Victim: none History of harm to others?: No Assessment of Violence: None Noted Violent Behavior Description: none noted Does patient have access to weapons?: No Criminal Charges Pending?: No Does patient have a court date: No Is patient on probation?: No  Psychosis Hallucinations: None noted Delusions:  None noted  Mental Status Report Appearance/Hygiene: In scrubs Eye Contact: Poor Motor Activity: Freedom of movement Speech: Soft, Slow Level of Consciousness: Drowsy Mood: Depressed Affect: Depressed Anxiety Level: Moderate Thought Processes: Circumstantial Judgement: Impaired Orientation: Person, Place, Time, Situation Obsessive Compulsive Thoughts/Behaviors: None  Cognitive Functioning Concentration: Normal Memory: Recent Intact, Remote Intact Is patient IDD: No Insight: Fair Impulse Control: Poor Appetite: Poor Have you had any weight changes? : No Change Sleep: Decreased Total Hours of Sleep: (UTA) Vegetative Symptoms: None  ADLScreening [facility] Assessment Services) Patient's cognitive ability adequate to safely complete daily activities?: Yes Patient able to express need for assistance with ADLs?: Yes Independently performs ADLs?: Yes (appropriate for developmental age)  Prior Inpatient Therapy Prior Inpatient Therapy: Yes Prior Therapy Dates: 2019, 2020 Prior Therapy Facilty/Provider(s): [facility] Reason for Treatment: depression, SI  Prior Outpatient Therapy Prior Outpatient Therapy: Yes Prior Therapy Dates: ongoing Prior Therapy Facilty/Provider(s): [facility] Reason for Treatment: Med management Does patient have an ACCT team?: No Does patient have Intensive In-House Services?  : No Does patient have [facility] services? : No Does patient have P4CC services?: No  ADL Screening (condition at time of admission) Patient's cognitive ability adequate to safely complete daily activities?: Yes Is the patient deaf or have difficulty hearing?: No Does the patient have difficulty seeing, even when wearing glasses/contacts?: No Does the patient have difficulty concentrating, remembering, or making decisions?: No Patient able to express need for assistance with ADLs?: Yes Does the patient have difficulty dressing  or bathing?: No Independently performs ADLs?: Yes  (appropriate for developmental age) Does the patient have difficulty walking or climbing stairs?: No Weakness of Legs: None Weakness of Arms/Hands: None  Home Assistive Devices/Equipment Home Assistive Devices/Equipment: None  Therapy Consults (therapy consults require a physician order) PT Evaluation Needed: No OT Evalulation Needed: No SLP Evaluation Needed: No Abuse/Neglect Assessment (Assessment to be complete while patient is alone) Abuse/Neglect Assessment Can Be Completed: Unable to assess, patient is non-responsive or altered mental status Values / Beliefs Cultural Requests During Hospitalization: None Spiritual Requests During Hospitalization: None Consults Spiritual Care Consult Needed: No Social Work Consult Needed: No Merchant navy officerAdvance Directives (For Healthcare) Does Patient Have a Medical Advance Directive?: No Would patient like information on creating a medical advance directive?: No - Patient declined          Disposition: Per Dr. Sharma CovertNorman and Elta GuadeloupeLaurie Parks, NP patient does not meet in patient criteria. Disposition Initial Assessment Completed for this Encounter: Yes  On Site Evaluation by:   Reviewed with Physician:    Celedonio MiyamotoMeredith  Matilda Fleig 10/24/2018 9:24 AM

## 2018-10-24 NOTE — BHH Suicide Risk Assessment (Cosign Needed)
Suicide Risk Assessment  Discharge Assessment   [facility]   Principal Problem: MDD (major depressive disorder), recurrent severe, without psychosis (HCC) Discharge Diagnoses: Principal Problem:   MDD (major depressive disorder), recurrent severe, without psychosis (HCC)   Total Time spent with patient: 30 minutes  Musculoskeletal: Strength & Muscle Tone: within normal limits Gait & Station: not tested Patient leans: N/A  Psychiatric Specialty Exam:   Blood pressure 116/84, pulse 87, temperature 99.2 F (37.3 C), temperature source Oral, resp. rate 16, height 5\' 3"  (1.6 m), weight 131.5 kg, SpO2 98 %.Body mass index is 51.37 kg/m.  General Appearance: Casual and Fairly Groomed  Patent attorneyye Contact::  Fair  Speech:  Clear and Coherent and Slow409  Volume:  Decreased  Mood:  Depressed and Irritable  Affect:  Congruent and Depressed  Thought Process:  Goal Directed and Descriptions of Associations: Intact  Orientation:  Full (Time, Place, and Person)  Thought Content:  Logical  Suicidal Thoughts:  No  Homicidal Thoughts:  No  Memory:  Immediate;   Good Recent;   Good Remote;   Fair  Judgement:  Fair  Insight:  Fair  Psychomotor Activity:  Decreased  Concentration:  Fair  Recall:  FiservFair  Fund of Knowledge:Good  Language: Good  Akathisia:  No  Handed:  Right  AIMS (if indicated):     Assets:  Communication Skills Social Support  Sleep:     Cognition: WNL  ADL's:  Intact   Mental Status Per Nursing Assessment::   On Admission:   [facility]. [facility] but this admission tested positive for opiates. [facility] she was prescribed Vicodin 10-325 mg, 20 tablets on 3/2/202 form [place] dentist in [place]. She stated she has 2 left. She was also prescribed Suboxone in January. Her UDS is positive for opiates, BAL negative. She is very sleepy and difficult to understand or wake  up. [facility] but it is likely [facility] was [facility] to [facility] due to her drug use, which would make her currently homeless.  Pt stated she lost an 45 year old son in January and has received no therapy or grief therapy for this. She wants resources for counseling/therapy/medication management. [facility], and once in Oct, 2019, for worsening depression and panic attacks.  [facility] and was placed on Latuda and Effexor. She was unable to state today who is prescribing her medications so it is unlikely she is taking them. Once she is more alert she will be discharged. Pt is psychiatrically clear.   Demographic Factors:  Caucasian, Low socioeconomic status and Unemployed  Loss Factors: Financial problems/change in socioeconomic status  Historical Factors: Family history of mental illness or substance abuse  Risk Reduction Factors:   Sense of responsibility to family  Continued Clinical Symptoms:  Alcohol/Substance Abuse/Dependencies  Cognitive Features That Contribute To Risk:  Closed-mindedness    Suicide Risk:  Minimal: No identifiable suicidal ideation.  Patients presenting with no risk factors but with morbid ruminations; may be classified as minimal risk based on the severity of the depressive symptoms    Plan Of Care/Follow-up recommendations:  Activity:  as tolerated Diet:  Heart healthy  Dorise HissLaurie Britton  Arville Care, NP 10/24/2018, 9:47 AM

## 2018-10-24 NOTE — ED Notes (Signed)
Bed: WBH34 Expected date:  Expected time:  Means of arrival:  Comments: 

## 2018-10-24 NOTE — ED Notes (Signed)
Pt wants to go smoke stating that she's bored being here and she can't sleep because of all the alarms, pt has disconnected herself and is walking around the department

## 2018-10-24 NOTE — Discharge Instructions (Signed)
For your behavioral health needs you are advised to follow up with [facility].  New patients are seen at their walk-in clinic.  Walk-in hours are Monday - Friday from 8:00 am - 12:00 pm, and from 1:00 pm - 3:00 pm.  Walk-in patients are seen on a first come, first served basis, so try to arrive as early as possible for the best chance of being seen the same day.  There is an initial fee of $22.50:       [facility]      [address]      [place], [place] [address]      [phone]  For grief counseling, contact [facility]:       [facility]      [address]      [place], [place] [address]      [phone]

## 2018-10-24 NOTE — ED Provider Notes (Signed)
[facility]-EMERGENCY DEPT Provider Note   CSN: [id number] Arrival date & time: 10/23/18  1949    History   Chief Complaint Chief Complaint  Patient presents with  . Emesis  . Dental Pain  . Medical Clearance    HPI Judy Robles is a 43 y.o. female.     HPI   43 year old female with a history of bipolar, hypertension, hyperlipidemia, chronic diastolic CHF, COPD, polysubstance abuse, history of narcotic dependence previously on Suboxone until 4 weeks ago, admission at the end of January for pneumonia, who presents with concern for depression and anxiety in setting of her son passing away in January, as well as vomiting over the last 3 weeks.  4 weeks ago she stopped Suboxone, reports she doesn't want to be on any narcotic pain medications because she feels they contributed to her pneumonia, and while she was hospitalized with pneumonia she missed her son's funeral.  Reports she stopped the suboxone "cold [place]."  She does not feel she has worsening pain since stopping the suboxone and reports that she has had long term abdominal and back pain prior to that.  She has had nausea and vomiting over the last 3 weeks, reports vomiting 3 times daily and that she has been unable to have much to eat or drink due to this over the last several weeks.  Does not report acute abdominal pain, fevers, new numbness or weakness.   She ran out of her effexor, latuda, and gabapentin last week but feels like her depression was worsening even when on those medications. She is tearful and hopeless. Doesn't have the will to live but denies having specific suicidal thoughts or plan.    Reports she lives in pain, and would like something for pain just while she is here.    Past Medical History:  Diagnosis Date  . Arthritis   . Bipolar 1 disorder (HCC)   . Depression   . Hypercholesterolemia   . Hypertension   . MRSA (methicillin resistant Staphylococcus aureus)   . Obesity   .  Renal disease   . Sleep apnea     Patient Active Problem List   Diagnosis Date Noted  . HCAP (healthcare-associated pneumonia) 09/16/2018  . LLL pneumonia (HCC) 09/16/2018  . Sepsis (HCC) 09/16/2018  . Hyponatremia 09/16/2018  . Hypokalemia 09/16/2018  . Severe benzodiazepine use disorder (HCC) 07/10/2018  . Bipolar II disorder (HCC)   . Severe recurrent major depression without psychotic features (HCC) 06/05/2018  . PTSD (post-traumatic stress disorder) 11/21/2017  . Major depressive disorder, recurrent episode, moderate (HCC) 11/05/2017  . Community acquired pneumonia of right lower lobe of lung (HCC) 09/02/2017  . Sepsis associated hypotension (HCC) 09/02/2017  . AKI (acute kidney injury) (HCC) 09/02/2017  . Nausea and vomiting 09/02/2017  . Drug overdose 04/22/2017  . Chest pain 10/09/2016  . Pressure injury of skin 10/05/2016  . Polysubstance dependence including opioid type drug with complication, episodic abuse (HCC) 07/21/2016  . MDD (major depressive disorder), recurrent severe, without psychosis (HCC) 07/21/2016  . Chronic pain 07/03/2015  . Diarrhea 05/01/2014  . Primary hypertension 04/17/2014    Past Surgical History:  Procedure Laterality Date  . CESAREAN SECTION    . FRACTURE SURGERY Right    ankle  . I&D EXTREMITY Right 04/16/2014   Procedure: IRRIGATION AND DEBRIDEMENT EXTREMITY;  Surgeon: Sharma CovertFred W Ortmann, MD;  Location: [facility] OR;  Service: Orthopedics;  Laterality: Right;     OB History   No obstetric history  on file.      Home Medications    Prior to Admission medications   Medication Sig Start Date End Date Taking? Authorizing Provider  furosemide (LASIX) 40 MG tablet Take 1 tablet (40 mg total) by mouth daily. Patient taking differently: Take 40 mg by mouth daily as needed for fluid.  09/23/18  Yes Dow Adolph N, DO  gabapentin (NEURONTIN) 600 MG tablet Take 600 mg by mouth 3 (three) times daily.   Yes [provider]  lurasidone (LATUDA) 20  MG TABS tablet Take 1 tablet (20 mg total) by mouth daily with supper. For mood control 07/23/18  Yes Nwoko, Nicole Kindred I, NP  potassium chloride SA (K-DUR,KLOR-CON) 20 MEQ tablet Take 1 tablet (20 mEq total) by mouth daily. Patient taking differently: Take 20 mEq by mouth daily as needed (cramping).  09/23/18  Yes Darlin Drop, DO  traZODone (DESYREL) 50 MG tablet Take 1 tablet (50 mg total) by mouth at bedtime as needed for sleep. Patient taking differently: Take 50 mg by mouth at bedtime.  07/23/18  Yes Armandina Stammer I, NP  venlafaxine XR (EFFEXOR-XR) 150 MG 24 hr capsule Take 1 capsule (150 mg total) by mouth daily with breakfast. For depression 07/24/18  Yes Nwoko, Nicole Kindred I, NP  nicotine (NICODERM CQ - DOSED IN MG/24 HOURS) 21 mg/24hr patch Place 1 patch (21 mg total) onto the skin daily. (May purchase from over the counter): For smoking cessation Patient not taking: Reported on 10/23/2018 07/24/18   Armandina Stammer I, NP  polyethylene glycol (MIRALAX / GLYCOLAX) packet Take 17 g by mouth daily as needed for mild constipation. Patient not taking: Reported on 10/23/2018 09/22/18   Darlin Drop, DO    Family History Family History  Problem Relation Age of Onset  . Throat cancer Mother   . Hypertension Father   . Stroke Father     Social History Social History   Tobacco Use  . Smoking status: Current Every Day Smoker    Packs/day: 1.00    Types: Cigarettes  . Smokeless tobacco: Never Used  . Tobacco comment: 1/2PPD  Substance Use Topics  . Alcohol use: Yes    Comment: rare, occasional  . Drug use: Yes    Types: Cocaine    Comment: occasional cocaine use     Allergies   Tylenol [acetaminophen] and Lisinopril   Review of Systems Review of Systems  Constitutional: Negative for fever.  HENT: Negative for sore throat.   Eyes: Negative for visual disturbance.  Respiratory: Negative for cough and shortness of breath.   Cardiovascular: Negative for chest pain.  Gastrointestinal: Positive for  abdominal pain, nausea and vomiting.  Genitourinary: Negative for difficulty urinating and dysuria.  Musculoskeletal: Positive for arthralgias, back pain and myalgias.  Skin: Negative for rash.  Neurological: Negative for syncope and headaches.  Psychiatric/Behavioral: Positive for dysphoric mood. Negative for hallucinations and suicidal ideas. The patient is nervous/anxious.      Physical Exam Updated Vital Signs BP (!) 143/110 (BP Location: Left Wrist)   Pulse 81   Temp 99.2 F (37.3 C) (Oral)   Resp 19   Ht  (1.6 m)   Wt 131.5 kg   SpO2 96%   BMI 51.37 kg/m   Physical Exam Vitals signs and nursing note reviewed.  Constitutional:      General: She is not in acute distress.    Appearance: She is well-developed. She is not diaphoretic.  HENT:     Head: Normocephalic and atraumatic.  Eyes:     Conjunctiva/sclera: Conjunctivae normal.  Neck:     Musculoskeletal: Normal range of motion.  Cardiovascular:     Rate and Rhythm: Regular rhythm. Tachycardia present.     Heart sounds: Normal heart sounds. No murmur. No friction rub. No gallop.   Pulmonary:     Effort: Pulmonary effort is normal. No respiratory distress.     Breath sounds: Normal breath sounds. No wheezing or rales.  Abdominal:     General: There is no distension.     Palpations: Abdomen is soft.     Tenderness: There is no abdominal tenderness. There is no guarding.     Comments: Conversational during exam  Musculoskeletal:        General: No tenderness.  Skin:    General: Skin is warm and dry.     Findings: No erythema or rash.  Neurological:     Mental Status: She is alert and oriented to person, place, and time.      ED Treatments / Results  Labs (all labs ordered are listed, but only abnormal results are displayed) Labs Reviewed  COMPREHENSIVE METABOLIC PANEL - Abnormal; Notable for the following components:      Result Value   Sodium 128 (*)    Potassium 2.6 (*)    Chloride 96 (*)     CO2 20 (*)    Glucose, Bld 131 (*)    Creatinine, Ser 1.06 (*)    Calcium 8.0 (*)    Albumin 3.1 (*)    AST 131 (*)    ALT 102 (*)    All other components within normal limits  CBC - Abnormal; Notable for the following components:   WBC 12.1 (*)    All other components within normal limits  URINALYSIS, ROUTINE W REFLEX MICROSCOPIC - Abnormal; Notable for the following components:   Color, Urine STRAW (*)    Specific Gravity, Urine 1.003 (*)    Hgb urine dipstick SMALL (*)    Leukocytes,Ua TRACE (*)    All other components within normal limits  RAPID URINE DRUG SCREEN, HOSP PERFORMED - Abnormal; Notable for the following components:   Opiates POSITIVE (*)    All other components within normal limits  LIPASE, BLOOD  ETHANOL  SALICYLATE LEVEL  ACETAMINOPHEN LEVEL  BASIC METABOLIC PANEL  I-STAT BETA HCG BLOOD, ED ([facility], [facility], [facility] ONLY)    EKG None  Radiology No results found.  Procedures Procedures (including critical care time)  Medications Ordered in ED Medications  ondansetron (ZOFRAN-ODT) disintegrating tablet 4 mg (has no administration in time range)  potassium chloride 10 mEq in 100 mL IVPB (10 mEq Intravenous New Bag/Given 10/23/18 2322)  sodium chloride 0.9 % bolus 1,000 mL (has no administration in time range)  magnesium sulfate IVPB 2 g 50 mL (has no administration in time range)  hydrOXYzine (ATARAX/VISTARIL) tablet 50 mg (has no administration in time range)  sodium chloride flush (NS) 0.9 % injection 3 mL (3 mLs Intravenous Given 10/23/18 2034)  potassium chloride SA (K-DUR,KLOR-CON) CR tablet 40 mEq (40 mEq Oral Given 10/23/18 2158)  dicyclomine (BENTYL) injection 20 mg (20 mg Intramuscular Given 10/23/18 2323)  cyclobenzaprine (FLEXERIL) tablet 10 mg (10 mg Oral Given 10/23/18 2323)  ketorolac (TORADOL) 15 MG/ML injection 15 mg (15 mg Intravenous Given 10/23/18 2322)     Initial Impression / Assessment and Plan / ED Course  I have reviewed the triage vital signs and  the nursing notes.  Pertinent labs & imaging results that  were available during my care of the patient were reviewed by me and considered in my medical decision making (see chart for details).        43 year old female with a history of bipolar, hypertension, hyperlipidemia, chronic diastolic CHF, COPD, polysubstance abuse, history of narcotic dependence previously on Suboxone until 4 weeks ago, admission at the end of January for pneumonia, who presents with concern for depression and anxiety in setting of her son passing away in January, as well as vomiting over the last 3 weeks.  Patient with severe depressoin, hopelessness, tearfulness, and feel TTS consult is appropriate, however she still requires medical clearance.  Suspect nausea, vomiting, decreased appetite in the setting of suboxone withdrawal.  Abdominal exam benign, doubt acute intraabdominal process.  No sign of UTI.  Labs show similar elevation of AST/ALT. Tylenol level 10, history and level both do not correlate with overdose. Salicylate level normal.  Labs are significant for K of 2.6, with ECG showing borderline QTc, and sodium 128 from 136 one month ago.  Suspect these are likely secondary to dehydration from vomiting.  Will give K, Mg replacement, IV NS and reevaluate her BMP and vital signs.  If improved and stable, will be appropriate for TTS and medical clearance.  She is voluntary.  If she has continued tachycardia or other abnormalities, would consider admission.  Given dehydration, no shakiness, suspect tachycardia not benzodiazepine withdrawal.    Of note, she is persistent about receiving narcotic medications for pain and would like benzodiazepine for anxiety.  Discussed we recommend treatment of her symptoms with nonnarcotic medications to stay in line with long term goals.  She accepts other medications.  Repeat labs and VS pending at time of transfer of care.  Final Clinical Impressions(s) / ED Diagnoses   Final  diagnoses:  Nausea and vomiting, intractability of vomiting not specified, unspecified vomiting type  Hyponatremia  Hypokalemia  Depression, unspecified depression type  Anxiety    ED Discharge Orders    None       Alvira Monday, MD 10/24/18 [phone]

## 2018-10-24 NOTE — ED Notes (Signed)
Pt oriented to room and unit.  Pt is calm and cooperative.  Pt appears depressed and withdrawn.  Pt contracts for safety.  15 minute checks and video monitoring in place.

## 2018-10-24 NOTE — ED Notes (Signed)
Pt refusing to keep any vital sign cords on.

## 2018-12-18 ENCOUNTER — Inpatient Hospital Stay (HOSPITAL_COMMUNITY)
Admission: RE | Admit: 2018-12-18 | Discharge: 2018-12-21 | DRG: 885 | Disposition: A | Discharge status: Home / Self Care | Payer: Medicaid Other | Attending: Psychiatry | Admitting: Psychiatry

## 2018-12-18 ENCOUNTER — Other Ambulatory Visit: Payer: Self-pay

## 2018-12-18 DIAGNOSIS — I10 Essential (primary) hypertension: Secondary | ICD-10-CM | POA: Diagnosis present

## 2018-12-18 DIAGNOSIS — G4730 Sleep apnea, unspecified: Secondary | ICD-10-CM | POA: Diagnosis present

## 2018-12-18 DIAGNOSIS — G8929 Other chronic pain: Secondary | ICD-10-CM | POA: Diagnosis present

## 2018-12-18 DIAGNOSIS — Z79891 Long term (current) use of opiate analgesic: Secondary | ICD-10-CM | POA: Diagnosis not present

## 2018-12-18 DIAGNOSIS — E7800 Pure hypercholesterolemia, unspecified: Secondary | ICD-10-CM | POA: Diagnosis present

## 2018-12-18 DIAGNOSIS — Z590 Homelessness: Secondary | ICD-10-CM | POA: Diagnosis not present

## 2018-12-18 DIAGNOSIS — F1290 Cannabis use, unspecified, uncomplicated: Secondary | ICD-10-CM | POA: Diagnosis present

## 2018-12-18 DIAGNOSIS — F322 Major depressive disorder, single episode, severe without psychotic features: Secondary | ICD-10-CM | POA: Diagnosis present

## 2018-12-18 DIAGNOSIS — Z886 Allergy status to analgesic agent status: Secondary | ICD-10-CM

## 2018-12-18 DIAGNOSIS — Z6841 Body Mass Index (BMI) 40.0 and over, adult: Secondary | ICD-10-CM | POA: Diagnosis not present

## 2018-12-18 DIAGNOSIS — F1410 Cocaine abuse, uncomplicated: Secondary | ICD-10-CM | POA: Diagnosis present

## 2018-12-18 DIAGNOSIS — F1320 Sedative, hypnotic or anxiolytic dependence, uncomplicated: Secondary | ICD-10-CM | POA: Diagnosis present

## 2018-12-18 DIAGNOSIS — F1120 Opioid dependence, uncomplicated: Secondary | ICD-10-CM | POA: Diagnosis not present

## 2018-12-18 DIAGNOSIS — F1114 Opioid abuse with opioid-induced mood disorder: Secondary | ICD-10-CM

## 2018-12-18 DIAGNOSIS — R45851 Suicidal ideations: Secondary | ICD-10-CM | POA: Diagnosis present

## 2018-12-18 DIAGNOSIS — F1110 Opioid abuse, uncomplicated: Secondary | ICD-10-CM | POA: Diagnosis present

## 2018-12-18 DIAGNOSIS — Z8614 Personal history of Methicillin resistant Staphylococcus aureus infection: Secondary | ICD-10-CM

## 2018-12-18 DIAGNOSIS — Z888 Allergy status to other drugs, medicaments and biological substances status: Secondary | ICD-10-CM

## 2018-12-18 DIAGNOSIS — Z8249 Family history of ischemic heart disease and other diseases of the circulatory system: Secondary | ICD-10-CM

## 2018-12-18 DIAGNOSIS — T402X5A Adverse effect of other opioids, initial encounter: Secondary | ICD-10-CM | POA: Diagnosis present

## 2018-12-18 DIAGNOSIS — Z91410 Personal history of adult physical and sexual abuse: Secondary | ICD-10-CM | POA: Diagnosis not present

## 2018-12-18 MED ORDER — IBUPROFEN 100 MG PO CHEW
600.0000 mg | CHEWABLE_TABLET | Freq: Four times a day (QID) | ORAL | Status: DC | PRN
  Filled 2018-12-18: qty 6

## 2018-12-18 MED ORDER — IBUPROFEN 600 MG PO TABS
600.0000 mg | ORAL_TABLET | Freq: Four times a day (QID) | ORAL | Status: DC | PRN
  Administered 2018-12-18 – 2018-12-20 (×3): 600 mg via ORAL
  Filled 2018-12-18 (×3): qty 1

## 2018-12-18 MED ORDER — LURASIDONE HCL 20 MG PO TABS
20.0000 mg | ORAL_TABLET | Freq: Every day | ORAL | Status: DC
  Administered 2018-12-19 – 2018-12-20 (×2): 20 mg via ORAL
  Filled 2018-12-18 (×4): qty 1

## 2018-12-18 MED ORDER — GABAPENTIN 600 MG PO TABS
600.0000 mg | ORAL_TABLET | Freq: Three times a day (TID) | ORAL | Status: DC
  Administered 2018-12-19 – 2018-12-21 (×7): 600 mg via ORAL
  Filled 2018-12-18 (×13): qty 1

## 2018-12-18 MED ORDER — VENLAFAXINE HCL ER 150 MG PO CP24
150.0000 mg | ORAL_CAPSULE | Freq: Every day | ORAL | Status: DC
  Administered 2018-12-19 – 2018-12-20 (×2): 150 mg via ORAL
  Filled 2018-12-18 (×4): qty 1

## 2018-12-18 MED ORDER — HYDROXYZINE HCL 50 MG PO TABS
50.0000 mg | ORAL_TABLET | Freq: Once | ORAL | Status: AC
  Administered 2018-12-18: 50 mg via ORAL
  Filled 2018-12-18 (×2): qty 1

## 2018-12-18 MED ORDER — NICOTINE 21 MG/24HR TD PT24
21.0000 mg | MEDICATED_PATCH | Freq: Every day | TRANSDERMAL | Status: DC
  Administered 2018-12-20 – 2018-12-21 (×2): 21 mg via TRANSDERMAL
  Filled 2018-12-18 (×6): qty 1

## 2018-12-18 MED ORDER — TRAZODONE HCL 50 MG PO TABS
50.0000 mg | ORAL_TABLET | Freq: Every evening | ORAL | Status: DC | PRN
  Administered 2018-12-18 – 2018-12-20 (×3): 50 mg via ORAL
  Filled 2018-12-18 (×3): qty 1
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
## 2018-12-18 NOTE — H&P (Signed)
Behavioral Health Medical Screening Exam  Judy Robles is an 43 y.o. female patient presents as a walk in at [facility] with complaints of suicidal ideation and plan to walk into traffic.  Reports stressors are the death of her son 01/31/20homelessness, and drug use.  Patient is unable to contract for safety  Total Time spent with patient: 30 minutes  Psychiatric Specialty Exam: Physical Exam  Vitals reviewed. Constitutional: She is oriented to person, place, and time. She appears well-developed and well-nourished.  Neck: Normal range of motion. Neck supple.  Respiratory: Effort normal.  Musculoskeletal: Normal range of motion.  Neurological: She is alert and oriented to person, place, and time.  Skin: Skin is warm and dry.  Psychiatric: Her speech is normal and behavior is normal. Her mood appears anxious. Cognition and memory are normal. She expresses impulsivity. She exhibits a depressed mood. She expresses suicidal ideation. She expresses suicidal plans.    Review of Systems  Psychiatric/Behavioral: Positive for depression, substance abuse and suicidal ideas. Negative for hallucinations. The patient is not nervous/anxious.   All other systems reviewed and are negative.   Blood pressure (!) 152/87, pulse 72, temperature 97.8 F (36.6 C), resp. rate 16.There is no height or weight on file to calculate BMI.  General Appearance: Casual  Eye Contact:  Good  Speech:  Clear and Coherent and Normal Rate  Volume:  Normal  Mood:  Anxious, Depressed, Hopeless and Worthless  Affect:  Depressed, Flat and Tearful  Thought Process:  Coherent and Goal Directed  Orientation:  Full (Time, Place, and Person)  Thought Content:  denies hallucinations, delusions, and paranoia  Suicidal Thoughts:  Yes.  with intent/plan  Homicidal Thoughts:  No  Memory:  Immediate;   Good Recent;   Good Remote;   Good  Judgement:  Impaired  Insight:  Lacking  Psychomotor Activity:  Normal  Concentration:  Concentration: Good and Attention Span: Good  Recall:  Good  Fund of Knowledge:Fair  Language: Good  Akathisia:  No  Handed:  Right  AIMS (if indicated):     Assets:  Communication Skills Desire for Improvement Physical Health  Sleep:       Musculoskeletal: Strength & Muscle Tone: within normal limits Gait & Station: normal Patient leans: N/A  Blood pressure (!) 152/87, pulse 72, temperature 97.8 F (36.6 C), resp. rate 16.  Recommendations:  Inpatient psychiatric treatment  Based on my evaluation the patient does not appear to have an emergency medical condition.   , NP 12/18/2018, 6:01 PM

## 2018-12-19 DIAGNOSIS — F322 Major depressive disorder, single episode, severe without psychotic features: Principal | ICD-10-CM

## 2018-12-19 DIAGNOSIS — Z590 Homelessness: Secondary | ICD-10-CM

## 2018-12-19 DIAGNOSIS — F1120 Opioid dependence, uncomplicated: Secondary | ICD-10-CM

## 2018-12-19 MED ORDER — METOPROLOL SUCCINATE ER 50 MG PO TB24
100.0000 mg | ORAL_TABLET | Freq: Every day | ORAL | Status: DC
  Administered 2018-12-19 – 2018-12-21 (×3): 100 mg via ORAL
  Filled 2018-12-19: qty 1
  Filled 2018-12-19: qty 2
  Filled 2018-12-19 (×2): qty 1
  Filled 2018-12-19: qty 2
  Filled 2018-12-19: qty 1

## 2018-12-19 NOTE — Progress Notes (Signed)
Admission Note:  Patient transferred to the unit from Observation unit due to complain of severe depression and suicidal ideation.  Patient currently denies suicidal thoughts while in the hospital.  States she is here to get help for her depression and to find placement.  Patient presents with a flat affect and depressed mood.  Patient oriented to the unit, staff and room.  Skin assessment completed. Skin is dry and intact.  Requested and received Vistaril 50 mg for anxiety and motrin 600 mg given for pain with good effect.  Routine safety checks maintained every 15 minutes.  Patient is safe on the unit.
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
## 2018-12-19 NOTE — Tx Team (Addendum)
Interdisciplinary Treatment and Diagnostic Plan Update  12/19/2018 Time of Session: [place] MRN: [id number]  Principal Diagnosis: <principal problem not specified>  Secondary Diagnoses: Active Problems:   MDD (major depressive disorder), severe (HCC)   Current Medications:  Current Facility-Administered Medications  Medication Dose Route Frequency Provider Last Rate Last Dose  . gabapentin (NEURONTIN) tablet 600 mg  600 mg Oral TID Rankin, Shuvon B, NP      . ibuprofen (ADVIL) tablet 600 mg  600 mg Oral Q6H PRN Patriciaann Clan E, PA-C   600 mg at 12/18/18 2305  . lurasidone (LATUDA) tablet 20 mg  20 mg Oral Q supper Rankin, Shuvon B, NP      . metoprolol succinate (TOPROL-XL) 24 hr tablet 100 mg  100 mg Oral Daily Johnn Hai, MD      . nicotine (NICODERM CQ - dosed in mg/24 hours) patch 21 mg  21 mg Transdermal Daily Rankin, Shuvon B, NP      . traZODone (DESYREL) tablet 50 mg  50 mg Oral QHS PRN Rankin, Shuvon B, NP   50 mg at 12/18/18 2207  . venlafaxine XR (EFFEXOR-XR) 24 hr capsule 150 mg  150 mg Oral Q breakfast Rankin, Shuvon B, NP       PTA Medications: Medications Prior to Admission  Medication Sig Dispense Refill Last Dose  . venlafaxine XR (EFFEXOR-XR) 150 MG 24 hr capsule Take 1 capsule by mouth 2 (two) times a day.     . gabapentin (NEURONTIN) 800 MG tablet Take 1 tablet by mouth 3 (three) times daily.     Marland Kitchen lurasidone (LATUDA) 20 MG TABS tablet Take 1 tablet (20 mg total) by mouth daily with supper. For mood control 30 tablet 0 Past Week at Unknown time  . traZODone (DESYREL) 50 MG tablet Take 1 tablet (50 mg total) by mouth at bedtime as needed for sleep. (Patient taking differently: Take 50 mg by mouth at bedtime. ) 30 tablet 0 Past Week at Unknown time    Patient Stressors:    Patient Strengths:    Treatment Modalities: Medication Management, Group therapy, Case management,  1 to 1 session with clinician, Psychoeducation, Recreational  therapy.   Physician Treatment Plan for Primary Diagnosis: <principal problem not specified> Long Term Goal(s): Improvement in symptoms so as ready for discharge Improvement in symptoms so as ready for discharge   Short Term Goals: Ability to maintain clinical measurements within normal limits will improve Compliance with prescribed medications will improve  Medication Management: Evaluate patient's response, side effects, and tolerance of medication regimen.  Therapeutic Interventions: 1 to 1 sessions, Unit Group sessions and Medication administration.  Evaluation of Outcomes: Not Met  Physician Treatment Plan for Secondary Diagnosis: Active Problems:   MDD (major depressive disorder), severe ([place])  Long Term Goal(s): Improvement in symptoms so as ready for discharge Improvement in symptoms so as ready for discharge   Short Term Goals: Ability to maintain clinical measurements within normal limits will improve Compliance with prescribed medications will improve     Medication Management: Evaluate patient's response, side effects, and tolerance of medication regimen.  Therapeutic Interventions: 1 to 1 sessions, Unit Group sessions and Medication administration.  Evaluation of Outcomes: Not Met   RN Treatment Plan for Primary Diagnosis: <principal problem not specified> Long Term Goal(s): Knowledge of disease and therapeutic regimen to maintain health will improve  Short Term Goals: Ability to participate in decision making will improve, Ability to verbalize feelings will improve, Ability to disclose and  discuss suicidal ideas, Ability to identify and develop effective coping behaviors will improve and Compliance with prescribed medications will improve  Medication Management: RN will administer medications as ordered by provider, will assess and evaluate patient's response and provide education to patient for prescribed medication. RN will report any adverse and/or side effects  to prescribing provider.  Therapeutic Interventions: 1 on 1 counseling sessions, Psychoeducation, Medication administration, Evaluate responses to treatment, Monitor vital signs and CBGs as ordered, Perform/monitor CIWA, COWS, AIMS and Fall Risk screenings as ordered, Perform wound care treatments as ordered.  Evaluation of Outcomes: Not Met   LCSW Treatment Plan for Primary Diagnosis: <principal problem not specified> Long Term Goal(s): Safe transition to appropriate next level of care at discharge, Engage patient in therapeutic group addressing interpersonal concerns.  Short Term Goals: Engage patient in aftercare planning with referrals and resources and Increase skills for wellness and recovery  Therapeutic Interventions: Assess for all discharge needs, 1 to 1 time with Social worker, Explore available resources and support systems, Assess for adequacy in community support network, Educate family and significant other(s) on suicide prevention, Complete Psychosocial Assessment, Interpersonal group therapy.  Evaluation of Outcomes: Not Met   Progress in Treatment: Attending groups: No. Participating in groups: No. Taking medication as prescribed: Yes. Toleration medication: Yes. Family/Significant other contact made: No, will contact:  Will contact will patient gives consent to contact Patient understands diagnosis: Yes. Discussing patient identified problems/goals with staff: Yes. Medical problems stabilized or resolved: Yes. Denies suicidal/homicidal ideation: Yes. Issues/concerns per patient self-inventory: No. Other:   New problem(s) identified: No, Describe:  None  New Short Term/Long Term Goal(s):  Patient Goals:  "To stop less depressed"  Discharge Plan or Barriers:   Reason for Continuation of Hospitalization: Aggression Medication stabilization  Estimated Length of Stay: 3-5 days  Attendees: Patient: 12/19/2018   Physician: Neita Garnet, MD 12/19/2018    Nursing: Legrand Como, RN 12/19/2018   RN Care Manager: 12/19/2018   Social Worker: Ardelle Anton, LCSW 12/19/2018   Recreational Therapist:  12/19/2018   Other:  12/19/2018   Other:  12/19/2018   Other: 12/19/2018       Scribe for Treatment Team: Trecia Rogers, LCSW 12/19/2018 1:11 PM

## 2018-12-19 NOTE — Progress Notes (Signed)
Recreation Therapy Notes  Date:  4.29.20 Time: 0930 Location: [address] Dayroom  Group Topic: Stress Management  Goal Area(s) Addresses:  Patient will identify positive stress management techniques. Patient will identify benefits of using stress management post d/c.  Intervention:  Stress Management  Activity :  Guided Imagery.  LRT read a script that took patients for a walk on the beach to hear the "peaceful waves".  Patients were to listen to the script as it was read to engage in the activity.  Education:  Stress Management, Discharge Planning.   Education Outcome: Acknowledges Education  Clinical Observations/Feedback:  Pt did not attend group.     Caroll Rancher, LRT/CTRS         Lillia Abed, Lesia Monica A 12/19/2018 11:00 AM

## 2018-12-19 NOTE — Plan of Care (Signed)
Progress note  pt found in bed; allowed to rest. Pt was assessed and asked to come up for medication. Pt non-compliant. Pt has been asleep all day. Pt's ekg was unremarkable. Pt safe on the unit. Q44m safety checks implemented and continued. Pt denies si/hi/ah/vh and verbally agrees to approach staff if these become apparent or before harming herself/others while at [facility]. Will continue to monitor.    Problem: Education: Goal: Knowledge of [facility] Education information/materials will improve Outcome: Progressing Goal: Emotional status will improve Outcome: Progressing Goal: Mental status will improve Outcome: Progressing Goal: Verbalization of understanding the information provided will improve Outcome: Progressing

## 2018-12-19 NOTE — H&P (Signed)
Psychiatric Admission Assessment Adult  Patient Identification: Judy Robles MRN:  [id number] Date of Evaluation:  12/19/2018 Chief Complaint:  mdd cocaine use disorder Principal Diagnosis: <principal problem not specified> Diagnosis:  Active Problems:   MDD (major depressive disorder), severe (HCC)  History of Present Illness:   This is the latest of multiple encounters with the healthcare system for Judy Robles a 43 year old homeless individual who has numerous past diagnoses to include history of sepsis, pneumonia, severe benzodiazepine use disorder, polysubstance dependencies including opiate and cocaine abuse dependencies, chronic pain complaints primary hypertension and obesity. She represented yesterday complaining of suicidal thoughts with plans to walk into traffic there clearly issues of secondary gain, during my interview she is focused on "getting medicines for agitation" when she is informed that she does not need medications for agitation she is barely awake and she has a history of benzodiazepine abuse she becomes irritable and less cooperative with the interview process but she denies current suicidal thoughts plans or intent but states she will not contract if she leaves here. Denies hallucinations. Denies thoughts of harming others. Drug screen on this admission positive for opiates patient denied recent abuse however  Associated Signs/Symptoms: Depression Symptoms:  psychomotor retardation, (Hypo) Manic Symptoms:  Impulsivity, Irritable Mood, Anxiety Symptoms:  denies Psychotic Symptoms:  denies PTSD Symptoms: NA Total Time spent with patient: 45 minutes  Past Psychiatric History: extensive  Is the patient at risk to self? Yes.    Has the patient been a risk to self in the past 6 months? Yes.    Has the patient been a risk to self within the distant past? Yes.    Is the patient a risk to others? No.  Has the patient been a risk to others in the past 6 months? No.   Has the patient been a risk to others within the distant past? No.   Prior Inpatient Therapy: Prior Inpatient Therapy: Yes Prior Therapy Dates: 07/2018 Prior Therapy Facilty/Provider(s): [facility] Reason for Treatment: depression Prior Outpatient Therapy: Prior Outpatient Therapy: Yes Prior Therapy Dates: current Prior [facility] Reason for Treatment: depression Does patient have an ACCT team?: No Does patient have Intensive In-House Services?  : No Does patient have [facility] services? : No Does patient have P4CC services?: No  Alcohol Screening:   Substance Abuse History in the last 12 months:  Yes.   Consequences of Substance Abuse: NA Previous Psychotropic Medications: Yes  Psychological Evaluations: No  Past Medical History:  Past Medical History:  Diagnosis Date  . Arthritis   . Bipolar 1 disorder (HCC)   . Depression   . Hypercholesterolemia   . Hypertension   . MRSA (methicillin resistant Staphylococcus aureus)   . Obesity   . Renal disease   . Sleep apnea     Past Surgical History:  Procedure Laterality Date  . CESAREAN SECTION    . FRACTURE SURGERY Right    ankle  . I&D EXTREMITY Right 04/16/2014   Procedure: IRRIGATION AND DEBRIDEMENT EXTREMITY;  Surgeon: Sharma Covert, MD;  Location: [facility] OR;  Service: Orthopedics;  Laterality: Right;   Family History:  Family History  Problem Relation Age of Onset  . Throat cancer Mother   . Hypertension Father   . Stroke Father    Family Psychiatric  History: Acknowledged but did not elaborate Tobacco Screening:   Social History:  Social History   Substance and Sexual Activity  Alcohol Use Yes   Comment: rare, occasional  Social History   Substance and Sexual Activity  Drug Use Yes  . Types: Cocaine   Comment: occasional cocaine use    Additional Social History: Marital status: Single    Pain Medications: See MAR Prescriptions: See MAR Over the Counter: See MAR History  of alcohol / drug use?: Yes Longest period of sobriety (when/how long): NA Name of Substance 1: crack cocaine 1 - Age of First Use: 42 1 - Amount (size/oz): unknown 1 - Frequency: daily 1 - Duration: unknown 1 - Last Use / Amount: 12/18/2018 Name of Substance 2: marijuana 2 - Age of First Use: unknown 2 - Amount (size/oz): unknown 2 - Frequency: once a week 2 - Last Use / Amount: 12/15/2018                Allergies:   Allergies  Allergen Reactions  . Tylenol [Acetaminophen] Itching and Other (See Comments)    Welts  . Lisinopril Other (See Comments)    Shuts down kidneys   Lab Results: No results found for this or any previous visit (from the past 48 hour(s)).  Blood Alcohol level:  Lab Results  Component Value Date   ETH <10 10/23/2018   ETH <10 11/05/2017    Metabolic Disorder Labs:  Lab Results  Component Value Date   HGBA1C 5.3 06/06/2018   MPG 105.41 06/06/2018   MPG 114 04/16/2014   No results found for: PROLACTIN Lab Results  Component Value Date   CHOL 180 06/06/2018   TRIG 128 06/06/2018   HDL 49 06/06/2018   CHOLHDL 3.7 06/06/2018   VLDL 26 06/06/2018   LDLCALC 105 (H) 06/06/2018   LDLCALC 50 09/05/2017    Current Medications: Current Facility-Administered Medications  Medication Dose Route Frequency Provider Last Rate Last Dose  . gabapentin (NEURONTIN) tablet 600 mg  600 mg Oral TID Rankin, Shuvon B, NP      . ibuprofen (ADVIL) tablet 600 mg  600 mg Oral Q6H PRN Donell Sievert E, PA-C   600 mg at 12/18/18 2305  . lurasidone (LATUDA) tablet 20 mg  20 mg Oral Q supper Rankin, Shuvon B, NP      . metoprolol succinate (TOPROL-XL) 24 hr tablet 100 mg  100 mg Oral Daily Malvin Johns, MD      . nicotine (NICODERM CQ - dosed in mg/24 hours) patch 21 mg  21 mg Transdermal Daily Rankin, Shuvon B, NP      . traZODone (DESYREL) tablet 50 mg  50 mg Oral QHS PRN Rankin, Shuvon B, NP   50 mg at 12/18/18 2207  . venlafaxine XR (EFFEXOR-XR) 24 hr capsule 150  mg  150 mg Oral Q breakfast Rankin, Shuvon B, NP       PTA Medications: Medications Prior to Admission  Medication Sig Dispense Refill Last Dose  . venlafaxine XR (EFFEXOR-XR) 150 MG 24 hr capsule Take 1 capsule by mouth 2 (two) times a day.     . gabapentin (NEURONTIN) 800 MG tablet Take 1 tablet by mouth 3 (three) times daily.     Marland Kitchen lurasidone (LATUDA) 20 MG TABS tablet Take 1 tablet (20 mg total) by mouth daily with supper. For mood control 30 tablet 0 Past Week at Unknown time  . traZODone (DESYREL) 50 MG tablet Take 1 tablet (50 mg total) by mouth at bedtime as needed for sleep. (Patient taking differently: Take 50 mg by mouth at bedtime. ) 30 tablet 0 Past Week at Unknown time    Musculoskeletal: Strength &  Muscle Tone: flaccid Gait & Station: unsteady Patient leans: N/A  Psychiatric Specialty Exam: Physical Exam obesity/hypertension  ROS positive for obesity, neurological review of systems negative for head trauma or seizures, cardiovascular and lungs positive for hypertension and history of pneumonia, endocrine negative for thyroid issues  Blood pressure (!) 143/78, pulse 64, temperature 98.7 F (37.1 C), temperature source Oral, resp. rate 16, height 5\' 3"  (1.6 m), weight 127 kg.Body mass index is 49.6 kg/m.  General Appearance: Disheveled  Eye Contact:  Minimal  Speech:  Slow and Slurred  Volume:  Decreased  Mood:  Dysphoric and Irritable  Affect:  Depressed and Restricted  Thought Process:  Linear and some paucity of content  Orientation:  Full (Time, Place, and Person)  Thought Content:  Logical and Tangential  Suicidal Thoughts:  No  Homicidal Thoughts:  No  Memory:  Immediate;   Poor  Judgement:  Poor  Insight:  Shallow  Psychomotor Activity:  Decreased  Concentration:  Concentration: Poor  Recall:  Poor  Fund of Knowledge:  Poor  Language:  Poor  Akathisia:  Negative  Handed:  Right  AIMS (if indicated):     Assets:  Leisure Time Physical Health  ADL's:   Intact  Cognition:  WNL  Sleep:   Evaluate tonight    Treatment Plan Summary: Daily contact with patient to assess and evaluate symptoms and progress in treatment, Medication management and Plan Continue to seek placement continue current meds and regimen monitor for withdrawal  Observation Level/Precautions:  15 minute checks  Laboratory:  UDS  Psychotherapy: Cognitive and rehab based  Medications: Add antihypertensive  Consultations: Not necessary  Discharge Concerns: Housing and long-term sobriety  Estimated LOS: 3 days  Other: Issues of secondary gain prominent   Physician Treatment Plan for Primary Diagnosis: <principal problem not specified> Long Term Goal(s): Improvement in symptoms so as ready for discharge  Short Term Goals: Ability to maintain clinical measurements within normal limits will improve  Physician Treatment Plan for Secondary Diagnosis: Active Problems:   MDD (major depressive disorder), severe (HCC)  Long Term Goal(s): Improvement in symptoms so as ready for discharge  Short Term Goals: Compliance with prescribed medications will improve  I certify that inpatient services furnished can reasonably be expected to improve the patient's condition.    Malvin JohnsFARAH,Lexa Coronado, MD 4/29/202010:07 AM

## 2018-12-19 NOTE — Progress Notes (Signed)
Psychoeducational Group Note  Date:  12/19/2018 Time:  2030 Group Topic/Focus:  wrap up group  Participation Level: Did Not Attend  Participation Quality:  Not Applicable  Affect:  Not Applicable  Cognitive:  Not Applicable  Insight:  Not Applicable  Engagement in Group: Not Applicable  Additional Comments:  Pt was notified that group was beginning but remained in bed.   Marcille Buffy 12/19/2018, 11:21 PM

## 2018-12-19 NOTE — [facility] Suicide Risk Assessment (Signed)
[facility] Discharge Suicide Risk Assessment   Principal Problem: Homelessness substance abuse and depression Discharge Diagnoses: Active Problems:   MDD (major depressive disorder), severe (HCC)   Total Time spent with patient: 45 minutes  Patient denies current suicidal thoughts plans or intent is med seeking and we discussed the importance of abstinence from drugs Mental Status Per Nursing Assessment::   On Admission:     Demographic Factors:  Caucasian and Low socioeconomic status  Loss Factors: Decrease in vocational status  Historical Factors: n/a  Risk Reduction Factors:   Sense of responsibility to family and Religious beliefs about death  Continued Clinical Symptoms:  Dysthymia  Cognitive Features That Contribute To Risk:  None    Suicide Risk:  Minimal: No identifiable suicidal ideation.  Patients presenting with no risk factors but with morbid ruminations; may be classified as minimal risk based on the severity of the depressive symptoms    Plan Of Care/Follow-up recommendations:  Activity:  full  Korinne Greenstein, MD 12/19/2018, 10:05 AM

## 2018-12-19 NOTE — [facility] Suicide Risk Assessment (Addendum)
[facility] INPATIENT:  Family/Significant Other Suicide Prevention Education  Suicide Prevention Education:  Education Completed; Pt's mother, Ayodele Spooner [phone],  has been identified by the patient as the family member/significant other with whom the patient will be residing, and identified as the person(s) who will aid the patient in the event of a mental health crisis (suicidal ideations/suicide attempt).  With written consent from the patient, the family member/significant other has been provided the following suicide prevention education, prior to the and/or following the discharge of the patient.  The suicide prevention education provided includes the following:  Suicide risk factors  Suicide prevention and interventions  National Suicide Hotline telephone number  [facility] assessment telephone number  [facility] Emergency Assistance 911  [facility] and/or Residential Mobile Crisis Unit telephone number  Request made of family/significant other to:  Remove weapons (e.g., guns, rifles, knives), all items previously/currently identified as safety concern.    Remove drugs/medications (over-the-counter, prescriptions, illicit drugs), all items previously/currently identified as a safety concern.  The family member/significant other verbalizes understanding of the suicide prevention education information provided.  The family member/significant other agrees to remove the items of safety concern listed above.  CSW contacted pt's mother, Aliah Lemelin. Pt's mother stated that she has not seen or heard from the patient since February of 2020. Pt's mother did not know that the patient was in the hospital but thanked the CSW for letting her know. Pt's mother stated that the patient has always had a lot of sadness, depression, and anxiety. Pt's mother reported that the patient's son passed away in 09/18/22 and the patient was not able to go to the funeral because she was in  the hospital at that time.   Patient's mother contacted CSW back to report that the patient has been homeless and has abused pills in the past. Pt's mother reported that she was going to let her come back to live with her, but that her other daughter will not allow the patient to move back in. Pt's mother lives with the pt's sister.   Delphia Grates 12/19/2018, 3:14 PM

## 2018-12-20 MED ORDER — VENLAFAXINE HCL ER 150 MG PO CP24
300.0000 mg | ORAL_CAPSULE | Freq: Every day | ORAL | Status: DC
  Administered 2018-12-21: 300 mg via ORAL
  Filled 2018-12-20 (×3): qty 2

## 2018-12-20 NOTE — Progress Notes (Signed)
[facility] MD Progress Note  12/20/2018 8:37 AM Judy Robles  MRN:  [id number] Subjective:    Patient is yet to make any housing arrangements she is requesting another day of hospitalization she continues to report vague thoughts of not wanting to be here but no suicidal plans or intent today asking for the escalation in her venlafaxine to 300 mg a day which she had taken previously as per her last med adjustments we have accommodated that. Principal Problem: Opiate abuse homelessness depression Diagnosis: Active Problems:   MDD (major depressive disorder), severe (HCC)  Total Time spent with patient: 15 min  Past Psychiatric History: ext  Past Medical History:  Past Medical History:  Diagnosis Date  . Arthritis   . Bipolar 1 disorder (HCC)   . Depression   . Hypercholesterolemia   . Hypertension   . MRSA (methicillin resistant Staphylococcus aureus)   . Obesity   . Renal disease   . Sleep apnea     Past Surgical History:  Procedure Laterality Date  . CESAREAN SECTION    . FRACTURE SURGERY Right    ankle  . I&D EXTREMITY Right 04/16/2014   Procedure: IRRIGATION AND DEBRIDEMENT EXTREMITY;  Surgeon: Sharma Covert, MD;  Location: [facility] OR;  Service: Orthopedics;  Laterality: Right;   Family History:  Family History  Problem Relation Age of Onset  . Throat cancer Mother   . Hypertension Father   . Stroke Father    Family Psychiatric  History: ukn Social History:  Social History   Substance and Sexual Activity  Alcohol Use Yes   Comment: rare, occasional     Social History   Substance and Sexual Activity  Drug Use Yes  . Types: Cocaine   Comment: occasional cocaine use    Social History   Socioeconomic History  . Marital status: Single    Spouse name: Not on file  . Number of children: Not on file  . Years of education: Not on file  . Highest education level: Not on file  Occupational History  . Not on file  Social Needs  . Financial resource strain: Not on file   . Food insecurity:    Worry: Not on file    Inability: Not on file  . Transportation needs:    Medical: Not on file    Non-medical: Not on file  Tobacco Use  . Smoking status: Current Every Day Smoker    Packs/day: 1.00    Types: Cigarettes  . Smokeless tobacco: Never Used  . Tobacco comment: 1/2PPD  Substance and Sexual Activity  . Alcohol use: Yes    Comment: rare, occasional  . Drug use: Yes    Types: Cocaine    Comment: occasional cocaine use  . Sexual activity: Not Currently    Birth control/protection: Surgical  Lifestyle  . Physical activity:    Days per week: Not on file    Minutes per session: Not on file  . Stress: Not on file  Relationships  . Social connections:    Talks on phone: Not on file    Gets together: Not on file    Attends religious service: Not on file    Active member of club or organization: Not on file    Attends meetings of clubs or organizations: Not on file    Relationship status: Not on file  Other Topics Concern  . Not on file  Social History Narrative  . Not on file   Additional Social History:  Pain Medications: See MAR Prescriptions: See MAR Over the Counter: See MAR History of alcohol / drug use?: Yes Longest period of sobriety (when/how long): NA Name of Substance 1: crack cocaine 1 - Age of First Use: 42 1 - Amount (size/oz): unknown 1 - Frequency: daily 1 - Duration: unknown 1 - Last Use / Amount: 12/18/2018 Name of Substance 2: marijuana 2 - Age of First Use: unknown 2 - Amount (size/oz): unknown 2 - Frequency: once a week 2 - Last Use / Amount: 12/15/2018                Sleep: Good  Appetite:  Good  Current Medications: Current Facility-Administered Medications  Medication Dose Route Frequency Provider Last Rate Last Dose  . gabapentin (NEURONTIN) tablet 600 mg  600 mg Oral TID Rankin, Shuvon B, NP   600 mg at 12/20/18 0753  . ibuprofen (ADVIL) tablet 600 mg  600 mg Oral Q6H PRN Kerry HoughSimon, Spencer E, PA-C    600 mg at 12/18/18 2305  . lurasidone (LATUDA) tablet 20 mg  20 mg Oral Q supper Rankin, Shuvon B, NP   20 mg at 12/19/18 1710  . metoprolol succinate (TOPROL-XL) 24 hr tablet 100 mg  100 mg Oral Daily Malvin JohnsFarah, Riah Kehoe, MD   100 mg at 12/20/18 0753  . nicotine (NICODERM CQ - dosed in mg/24 hours) patch 21 mg  21 mg Transdermal Daily Rankin, Shuvon B, NP   21 mg at 12/20/18 0752  . traZODone (DESYREL) tablet 50 mg  50 mg Oral QHS PRN Rankin, Shuvon B, NP   50 mg at 12/19/18 2259  . [START ON 12/21/2018] venlafaxine XR (EFFEXOR-XR) 24 hr capsule 300 mg  300 mg Oral Q breakfast Malvin JohnsFarah, Madison Direnzo, MD        Lab Results: No results found for this or any previous visit (from the past 48 hour(s)).  Blood Alcohol level:  Lab Results  Component Value Date   ETH <10 10/23/2018   ETH <10 11/05/2017    Metabolic Disorder Labs: Lab Results  Component Value Date   HGBA1C 5.3 06/06/2018   MPG 105.41 06/06/2018   MPG 114 04/16/2014   No results found for: PROLACTIN Lab Results  Component Value Date   CHOL 180 06/06/2018   TRIG 128 06/06/2018   HDL 49 06/06/2018   CHOLHDL 3.7 06/06/2018   VLDL 26 06/06/2018   LDLCALC 105 (H) 06/06/2018   LDLCALC 50 09/05/2017    Physical Findings: AIMS:  , ,  ,  ,    CIWA:    COWS:     Musculoskeletal: Strength & Muscle Tone: within normal limits Gait & Station: normal Patient leans: N/A  Psychiatric Specialty Exam: Physical Exam  ROS  Blood pressure 107/67, pulse 60, temperature 98.7 F (37.1 C), temperature source Oral, resp. rate 16, height 5\' 3"  (1.6 m), weight 127 kg.Body mass index is 49.6 kg/m.  General Appearance: Casual  Eye Contact:  Minimal  Speech:  Slow and Slurred  Volume:  Decreased  Mood:  Dysphoric  Affect:  Blunt and Congruent  Thought Process:  Goal Directed and Linear  Orientation:  Full (Time, Place, and Person)  Thought Content:  Logical and Tangential  Suicidal Thoughts:  No  Homicidal Thoughts:  No  Memory:  Immediate;    Fair  Judgement:  Fair  Insight:  Fair  Psychomotor Activity:  Normal  Concentration:  Concentration: Fair  Recall:  [employer]  Fund of Knowledge:  Fair  Language:  Fair  Akathisia:  Negative  Handed:  Right  AIMS (if indicated):     Assets:  Communication Skills Desire for Improvement Leisure Time Physical Health  ADL's:  Intact  Cognition:  WNL  Sleep:  Number of Hours: 5.5     Treatment Plan Summary: Daily contact with patient to assess and evaluate symptoms and progress in treatment, Medication management and Plan Continue current monitoring for withdrawal continue current precautions probable discharge tomorrow escalate venlafaxine  Malvin Johns, MD 12/20/2018, 8:37 AM

## 2018-12-20 NOTE — Progress Notes (Signed)
The focus of this group is to help patients establish daily goals to achieve during treatment and discuss how the patient can incorporate goal setting into their daily lives to aide in recovery. ° °Pt did not attend goals group °

## 2018-12-20 NOTE — Plan of Care (Addendum)
Patient was anxious but pleasant upon initial approach. Denies SI HI AVH. Patient says she is not quite ready to discharge yet. Patient has brightened in mood throughout the day. Patient is compliant with medications- no side effects noted. Safety is maintained with 15 minute checks as well as environmental checks. Will continue to monitor and assess.  Problem: Education: Goal: Emotional status will improve Outcome: Progressing Goal: Mental status will improve Outcome: Progressing Goal: Verbalization of understanding the information provided will improve Outcome: Progressing   Problem: Activity: Goal: Interest or engagement in leisure activities will improve Outcome: Progressing

## 2018-12-20 NOTE — Progress Notes (Signed)
[place] NOVEL CORONAVIRUS (COVID-19) DAILY CHECK-OFF SYMPTOMS - answer yes or no to each - every day NO YES  Have you had a fever in the past 24 hours?  . Fever (Temp > 37.80C / 100F) X   Have you had any of these symptoms in the past 24 hours? . New Cough .  Sore Throat  .  Shortness of Breath .  Difficulty Breathing .  Unexplained Body Aches   X   Have you had any one of these symptoms in the past 24 hours not related to allergies?   . Runny Nose .  Nasal Congestion .  Sneezing   X   If you have had runny nose, nasal congestion, sneezing in the past 24 hours, has it worsened?  X   EXPOSURES - check yes or no X   Have you traveled outside the state in the past 14 days?  X   Have you been in contact with someone with a confirmed diagnosis of COVID-19 or PUI in the past 14 days without wearing appropriate PPE?  X   Have you been living in the same home as a person with confirmed diagnosis of COVID-19 or a PUI (household contact)?    X   Have you been diagnosed with COVID-19?    X              What to do next: Answered NO to all: Answered YES to anything:   Proceed with unit schedule Follow the [facility] Inpatient Flowsheet.   

## 2018-12-20 NOTE — Progress Notes (Signed)
Patient ID: Judy Robles, female   DOB: 07/17/1976, 43 y.o.   MRN: [id number]   CSW received call from pt's mother, Llana Zhou. Pt's mother wanted to know if the pt had a place to go when she gets discharged because the pt has not called her back. CSW let pt's mother know that pt will be discharged tomorrow.

## 2018-12-21 DIAGNOSIS — F1114 Opioid abuse with opioid-induced mood disorder: Secondary | ICD-10-CM

## 2018-12-21 MED ORDER — VENLAFAXINE HCL ER 150 MG PO CP24: 300.0000 mg | ORAL_CAPSULE | Freq: Every day | ORAL | 0 refills | Status: DC

## 2018-12-21 MED ORDER — LURASIDONE HCL 20 MG PO TABS: 20.0000 mg | ORAL_TABLET | Freq: Every day | ORAL | 0 refills | Status: DC

## 2018-12-21 MED ORDER — TRAZODONE HCL 50 MG PO TABS: 50.0000 mg | ORAL_TABLET | Freq: Every evening | ORAL | 0 refills | Status: DC | PRN

## 2018-12-21 MED ORDER — NICOTINE 21 MG/24HR TD PT24: 21.0000 mg | MEDICATED_PATCH | Freq: Every day | TRANSDERMAL | 0 refills | Status: DC

## 2018-12-21 MED ORDER — GABAPENTIN 600 MG PO TABS: 600.0000 mg | ORAL_TABLET | Freq: Three times a day (TID) | ORAL | 0 refills | Status: DC

## 2018-12-21 NOTE — Progress Notes (Signed)
   12/21/18 0333  COVID-19 Daily Checkoff  Have you had a fever (temp > 37.80C/100F)  in the past 24 hours?  No  COVID-19 EXPOSURE  Have you traveled outside the state in the past 14 days? No  Have you been in contact with someone with a confirmed diagnosis of COVID-19 or PUI in the past 14 days without wearing appropriate PPE? No  Have you been living in the same home as a person with confirmed diagnosis of COVID-19 or a PUI (household contact)? No  Have you been diagnosed with COVID-19? No   
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
## 2018-12-21 NOTE — Progress Notes (Signed)
  [facility] Discharge Plan :  Will you be returning to the same living situation after discharge:  No.; pt is going to go stay with a friend At discharge, do you have transportation home?: No.; pt is going to take the bus home Do you have the ability to pay for your medications: Yes,  medicaid  Release of information consent forms completed and in the chart;  Patient's signature needed at discharge.  Patient to Follow up at: Follow-up Information    [facility] Follow up on 12/27/2018.   Why:  Medication management appointment with Dr. Felicie Morn is Thursday, 5/7 at 10:00a.  You can return to agency for your SAIOP when you discharge.  Contact information: [address] [place] [address] [phone]        [facility] Follow up.   Why:  Primary care clinic for low-income/uninsured. Follow up for evaluation and management of blood pressure. Contact information: [address] [place] [address] [phone]          Next level of care provider has access to [facility] Link:no  Safety Planning and Suicide Prevention discussed: Yes,  with mother     Has patient been referred to the Quitline?: Patient refused referral  Patient has been referred for addiction treatment: N/A  Delphia Grates, LCSW 12/21/2018, 9:31 AM

## 2018-12-21 NOTE — Progress Notes (Signed)
Pt completed daily assessment this am and on this she wrote she denied having SI today and she rated her depression, hopelessness and anxiety " 4/4/6", respectively. Her dc teaching was completed by this Clinical research associate, she stated verbal understanding and willingness to comply and her belongings in her locker were returned to her per protocol. She was given cc of dc instrucitons ( SRA, AVS, SSP and transition record) and then she was escorted to bldg entrance , where she will exit and take city bus home. Pt dc'd/

## 2018-12-21 NOTE — Progress Notes (Signed)
Recreation Therapy Notes  Date:  5.1.20 Time: 0930 Location: [address] Dayroom  Group Topic: Stress Management  Goal Area(s) Addresses:  Patient will identify positive stress management techniques. Patient will identify benefits of using stress management post d/c.  Intervention:  Stress Management  Activity :  Meditation.  LRT introduced the stress management technique of meditation.  Meditation focused on making the most of the and the possibilities that come with it.  Patients were to follow along as meditation played in order to engage in activity.  Education:  Stress Management, Discharge Planning.   Education Outcome: Acknowledges Education  Clinical Observations/Feedback:  Pt did not attend group.    Cymone Yeske, LRT/CTRS         Alexus Michael A 12/21/2018 10:54 AM 

## 2018-12-21 NOTE — [facility] Suicide Risk Assessment (Signed)
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  Patients presenting with no risk factors but with morbid ruminations; may be classified as minimal risk based on the severity of the depressive symptoms  Follow-up Information    [facility] Follow up on 12/27/2018.   Why:  Medication management appointment with Dr. Felicie Morn is Thursday, 5/7 at 10:00a.  You can return to agency for your SAIOP when you discharge.  Contact information: [address] [place] [address] [phone]        [facility] Follow up.   Why:  Primary care clinic for low-income/uninsured. Follow up for evaluation and management of blood pressure. Contact information: [address] [place] [address] [phone]          Plan Of Care/Follow-up recommendations:  Activity:  full  Tavionna Grout, MD 12/21/2018, 9:21 AM

## 2018-12-21 NOTE — Progress Notes (Signed)
D: Pt was in bed in her room upon initial approach.  Pt presents with depressed affect and mood.  She reports "we're lazy today."  Her goal is "to sleep."  Pt reports the best part of her day was "talking to my mom."  Pt discussed the loss of her son with Probation officer.  Actively listened to pt and provided support and encouragement.  Pt denies SI/HI, denies hallucinations, reports bilateral leg pain of 4/10.  Pt has been visible in milieu briefly.  She interacts with others appropriately.  A: Introduced self to pt.  Met with pt 1:1.  Actively listened to pt and offered support and encouragement.  PRN medication administered for pain and sleep.  Q15 minute safety checks maintained.  R: Pt is safe on the unit.  Pt is compliant with medications.  Pt verbally contracts for safety.  Will continue to monitor and assess.

## 2019-03-17 ENCOUNTER — Other Ambulatory Visit: Payer: Self-pay

## 2019-03-17 ENCOUNTER — Emergency Department (HOSPITAL_COMMUNITY)
Admission: EM | Admit: 2019-03-17 | Discharge: 2019-03-17 | Discharge status: Against Medical Advice | Payer: Medicaid Other | Attending: Emergency Medicine | Admitting: Emergency Medicine

## 2019-03-17 ENCOUNTER — Encounter (HOSPITAL_COMMUNITY): Payer: Self-pay | Admitting: Emergency Medicine

## 2019-03-17 ENCOUNTER — Emergency Department (HOSPITAL_COMMUNITY): Payer: Medicaid Other

## 2019-03-17 DIAGNOSIS — Z5329 Procedure and treatment not carried out because of patient's decision for other reasons: Secondary | ICD-10-CM | POA: Insufficient documentation

## 2019-03-17 DIAGNOSIS — F17210 Nicotine dependence, cigarettes, uncomplicated: Secondary | ICD-10-CM | POA: Insufficient documentation

## 2019-03-17 DIAGNOSIS — I10 Essential (primary) hypertension: Secondary | ICD-10-CM | POA: Diagnosis not present

## 2019-03-17 DIAGNOSIS — R05 Cough: Secondary | ICD-10-CM | POA: Diagnosis not present

## 2019-03-17 DIAGNOSIS — Z20828 Contact with and (suspected) exposure to other viral communicable diseases: Secondary | ICD-10-CM | POA: Insufficient documentation

## 2019-03-17 DIAGNOSIS — R0789 Other chest pain: Secondary | ICD-10-CM | POA: Insufficient documentation

## 2019-03-17 DIAGNOSIS — R0602 Shortness of breath: Secondary | ICD-10-CM | POA: Diagnosis not present

## 2019-03-17 DIAGNOSIS — Z79899 Other long term (current) drug therapy: Secondary | ICD-10-CM | POA: Diagnosis not present

## 2019-03-17 DIAGNOSIS — R059 Cough, unspecified: Secondary | ICD-10-CM

## 2019-03-17 LAB — BASIC METABOLIC PANEL
Anion gap: 8 (ref 5–15)
BUN: 5 mg/dL — ABNORMAL LOW (ref 6–20)
CO2: 22 mmol/L (ref 22–32)
Calcium: 8.2 mg/dL — ABNORMAL LOW (ref 8.9–10.3)
Chloride: 106 mmol/L (ref 98–111)
Creatinine, Ser: 1.07 mg/dL — ABNORMAL HIGH (ref 0.44–1.00)
GFR calc Af Amer: >60 mL/min (ref >60)
GFR calc non Af Amer: >60 mL/min (ref >60)
Glucose, Bld: 169 mg/dL — ABNORMAL HIGH (ref 70–99)
Potassium: 3.8 mmol/L (ref 3.5–5.1)
Sodium: 136 mmol/L (ref 135–145)

## 2019-03-17 LAB — CBC
HCT: 36 % (ref 36.0–46.0)
Hemoglobin: 11.5 g/dL — ABNORMAL LOW (ref 12.0–15.0)
MCH: 32.5 pg (ref 26.0–34.0)
MCHC: 31.9 g/dL (ref 30.0–36.0)
MCV: 101.7 fL — ABNORMAL HIGH (ref 80.0–100.0)
Platelets: 181 10*3/uL (ref 150–400)
RBC: 3.54 MIL/uL — ABNORMAL LOW (ref 3.87–5.11)
RDW: 13.1 % (ref 11.5–15.5)
WBC: 7 10*3/uL (ref 4.0–10.5)
nRBC: 0 % (ref 0.0–0.2)

## 2019-03-17 LAB — I-STAT BETA HCG BLOOD, ED (MC, WL, AP ONLY): I-stat hCG, quantitative: <5 m[IU]/mL (ref <5)

## 2019-03-17 LAB — RAPID URINE DRUG SCREEN, HOSP PERFORMED
Amphetamines: NOT DETECTED
Barbiturates: NOT DETECTED
Benzodiazepines: POSITIVE — AB
Cocaine: POSITIVE — AB
Opiates: NOT DETECTED
Tetrahydrocannabinol: POSITIVE — AB

## 2019-03-17 LAB — SARS CORONAVIRUS 2 BY RT PCR (HOSPITAL ORDER, PERFORMED IN ~~LOC~~ HOSPITAL LAB): SARS Coronavirus 2: NEGATIVE

## 2019-03-17 LAB — TROPONIN I (HIGH SENSITIVITY): Troponin I (High Sensitivity): 7 ng/L (ref <18)

## 2019-03-17 MED ORDER — KETOROLAC TROMETHAMINE 30 MG/ML IJ SOLN
30.0000 mg | Freq: Once | INTRAMUSCULAR | Status: DC
  Filled 2019-03-17: qty 1

## 2019-03-17 MED ORDER — SODIUM CHLORIDE 0.9 % IV BOLUS: 1000.0000 mL | Freq: Once | INTRAVENOUS | Status: DC

## 2019-03-17 MED ORDER — SODIUM CHLORIDE 0.9% FLUSH: 3.0000 mL | Freq: Once | INTRAVENOUS | Status: DC

## 2019-03-17 NOTE — ED Provider Notes (Signed)
[facility] EMERGENCY DEPARTMENT Provider Note   CSN: [id number] Arrival date & time: 03/17/19  1853    History   Chief Complaint Chief Complaint  Patient presents with  . Pneumonia    HPI Judy Robles is a 43 y.o. female hx of bipolar, HL, HTN, here presenting with cough, shortness of breath.  Patient states that she has some nonproductive cough since yesterday and some right-sided chest pain.  She does smoke and she did admit to using cocaine several days ago.  She has no COVID contacts and she states that she has a home to go to now and she has been staying at home.  She states that she was admitted several times for pneumonia so she was concerned for possible pneumonia .  She has some low-grade temperature at home but no actual fevers at home.     The history is provided by the patient.    Past Medical History:  Diagnosis Date  . Arthritis   . Bipolar 1 disorder (HCC)   . Depression   . Hypercholesterolemia   . Hypertension   . MRSA (methicillin resistant Staphylococcus aureus)   . Obesity   . Renal disease   . Sleep apnea     Patient Active Problem List   Diagnosis Date Noted  . 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 MDD (major depressive disorder), severe (HCC) 12/18/2018  . HCAP (healthcare-associated pneumonia) 09/16/2018  . LLL pneumonia (HCC) 09/16/2018  . Sepsis (HCC) 09/16/2018  . Hyponatremia 09/16/2018  . Hypokalemia 09/16/2018  . Severe benzodiazepine use disorder (HCC) 07/10/2018  . Bipolar II disorder (HCC)   . Severe recurrent major depression without psychotic features (HCC) 06/05/2018  . PTSD (post-traumatic stress disorder) 11/21/2017  . Major depressive disorder, recurrent episode, moderate (HCC) 11/05/2017  . Community acquired pneumonia of right lower lobe of lung (HCC) 09/02/2017  . Sepsis associated hypotension (HCC) 09/02/2017  . AKI (acute kidney injury) (HCC) 09/02/2017  . Nausea and  vomiting 09/02/2017  . Drug overdose 04/22/2017  . Chest pain 10/09/2016  . Pressure injury of skin 10/05/2016  . Polysubstance dependence including opioid type drug with complication, episodic abuse (HCC) 07/21/2016  . MDD (major depressive disorder), recurrent severe, without psychosis (HCC) 07/21/2016  . Chronic pain 07/03/2015  . Diarrhea 05/01/2014  . Primary hypertension 04/17/2014    Past Surgical History:  Procedure Laterality Date  . CESAREAN SECTION    . FRACTURE SURGERY Right    ankle  . I&D EXTREMITY Right 04/16/2014   Procedure: IRRIGATION AND DEBRIDEMENT EXTREMITY;  Surgeon: Sharma Covert, MD;  Location: [facility] OR;  Service: Orthopedics;  Laterality: Right;     OB History   No obstetric history on file.      Home Medications    Prior to Admission medications   Medication Sig Start Date End Date Taking? Authorizing Provider  gabapentin (NEURONTIN) 600 MG tablet Take 1 tablet (600 mg total) by mouth 3 (three) times daily. 12/21/18   Aldean Baker, NP  lurasidone (LATUDA) 20 MG TABS tablet Take 1 tablet (20 mg total) by mouth daily with supper. For mood 12/21/18   Aldean Baker, NP  nicotine (NICODERM CQ - DOSED IN MG/24 HOURS) 21 mg/24hr patch Place 1 patch (21 mg total) onto the skin daily. For smoking cessation 12/22/18   Aldean Baker, NP  traZODone (DESYREL) 50 MG tablet Take 1 tablet (50 mg total) by mouth at bedtime as needed for sleep.  12/21/18   Aldean Baker, NP  venlafaxine XR (EFFEXOR-XR) 150 MG 24 hr capsule Take 2 capsules (300 mg total) by mouth daily with breakfast. 12/22/18   Aldean Baker, NP    Family History Family History  Problem Relation Age of Onset  . Throat cancer Mother   . Hypertension Father   . Stroke Father     Social History Social History   Tobacco Use  . Smoking status: Current Every Day Smoker    Packs/day: 1.00    Types: Cigarettes  . Smokeless tobacco: Never Used  . Tobacco comment: 1/2PPD  Substance Use Topics  . Alcohol  use: Yes    Comment: rare, occasional  . Drug use: Yes    Types: Cocaine    Comment: occasional cocaine use     Allergies   Tylenol [acetaminophen] and Lisinopril   Review of Systems Review of Systems  Respiratory: Positive for cough.   All other systems reviewed and are negative.    Physical Exam Updated Vital Signs BP 117/61 (BP Location: Right Arm)   Pulse 72   Temp 98.3 F (36.8 C)   Resp 18   SpO2 98%   Physical Exam Vitals signs and nursing note reviewed.  HENT:     Head: Normocephalic.     Nose: Nose normal.     Mouth/Throat:     Mouth: Mucous membranes are moist.  Eyes:     Extraocular Movements: Extraocular movements intact.     Pupils: Pupils are equal, round, and reactive to light.  Neck:     Musculoskeletal: Normal range of motion.  Cardiovascular:     Rate and Rhythm: Normal rate and regular rhythm.     Pulses: Normal pulses.     Heart sounds: Normal heart sounds.  Pulmonary:     Effort: Pulmonary effort is normal.     Comments: Diminished R base  Abdominal:     General: Abdomen is flat.     Palpations: Abdomen is soft.  Musculoskeletal: Normal range of motion.  Skin:    General: Skin is warm.     Capillary Refill: Capillary refill takes less than 2 seconds.  Neurological:     General: No focal deficit present.     Mental Status: She is alert and oriented to person, place, and time.  Psychiatric:        Mood and Affect: Mood normal.        Behavior: Behavior normal.      ED Treatments / Results  Labs (all labs ordered are listed, but only abnormal results are displayed) Labs Reviewed  BASIC METABOLIC PANEL - Abnormal; Notable for the following components:      Result Value   Glucose, Bld 169 (*)    BUN 5 (*)    Creatinine, Ser 1.07 (*)    Calcium 8.2 (*)    All other components within normal limits  CBC - Abnormal; Notable for the following components:   RBC 3.54 (*)    Hemoglobin 11.5 (*)    MCV 101.7 (*)    All other  components within normal limits  SARS CORONAVIRUS 2 (HOSPITAL ORDER, PERFORMED IN [facility])  RAPID URINE DRUG SCREEN, HOSP PERFORMED  I-STAT BETA HCG BLOOD, ED ([facility], [facility], [facility] ONLY)  TROPONIN I (HIGH SENSITIVITY)  TROPONIN I (HIGH SENSITIVITY)    EKG EKG Interpretation  Date/Time:  Sunday March 17 2019 18:58:08 EDT Ventricular Rate:  79 PR Interval:  124 QRS Duration: 76 QT  Interval:  396 QTC Calculation: 314 R Axis:   63 Text Interpretation:  Normal sinus rhythm Normal ECG No significant change since last tracing Confirmed by Wandra Arthurs ([id number]) on 03/17/2019 9:00:49 PM   Radiology Dg Chest Port 1 View  Result Date: 03/17/2019 CLINICAL DATA:  Shortness of breath, cough, history of pneumonia EXAM: PORTABLE CHEST 1 VIEW COMPARISON:  09/21/2018 FINDINGS: Mild bibasilar atelectasis. No focal consolidation. No pleural effusion or pneumothorax. The heart is normal in size. IMPRESSION: No evidence of acute cardiopulmonary disease. Electronically Signed   By: Julian Hy M.D.   On: 03/17/2019 21:41    Procedures Procedures (including critical care time)  Medications Ordered in ED Medications  sodium chloride flush (NS) 0.9 % injection 3 mL (has no administration in time range)  sodium chloride 0.9 % bolus 1,000 mL (has no administration in time range)  ketorolac (TORADOL) 30 MG/ML injection 30 mg (has no administration in time range)     Initial Impression / Assessment and Plan / ED Course  I have reviewed the triage vital signs and the nursing notes.  Pertinent labs & imaging results that were available during my care of the patient were reviewed by me and considered in my medical decision making (see chart for details).       Judy Robles is a 43 y.o. female here with cough, SOB.  Patient does smoke and did use cocaine several days ago.  Her chest pain is been going on for several days and she is low risk for ACS at 1 set of troponins is sufficient. She does  have history of recurrent pneumonias so will get CXR to r/o pneumonia. No COVID exposures so will get outpatient COVID testing. Well appearing, not hypoxic.   9:57 PM CXR clear. WBC nl. COVID ordered. I ordered outpatient testing.  When the nurse went back into reassess her into the COVID testing she apparently eloped.  He does not have an IV.  She was ready for discharge but she did not receive her paperwork.  Final Clinical Impressions(s) / ED Diagnoses   Final diagnoses:  None    ED Discharge Orders    None       Drenda Freeze, MD 03/17/19 2159

## 2019-03-17 NOTE — ED Triage Notes (Addendum)
Per EMS- here for SOB started yesterday, has gotten worse hx of pneumonia in jan. Hx of sepsis. Pain to right "lung" when she breathes. Smokes. Exertional dyspnea. Pt also reports cocaine use several days ago and right sided chest pain.

## 2019-03-17 NOTE — ED Notes (Signed)
Pt expressed interest in leaving. Informed pt she would be getting another blood draw, IVF and pain meds. Returned with supplies and pt had left.

## 2019-03-17 NOTE — ED Notes (Signed)
Pt is feeling bad she mentioned she had done cocaine recently. She is tearful. Reassured pt this is a judgement free zone.

## 2019-03-17 NOTE — Discharge Instructions (Signed)
You don't have pneumonia currently.   Take your medicines as prescribed   See your doctor  Avoid using cocaine   Return to ER if you have worse cough, fever, vomiting

## 2019-04-03 ENCOUNTER — Encounter (HOSPITAL_COMMUNITY): Payer: Self-pay | Admitting: Emergency Medicine

## 2019-04-03 ENCOUNTER — Emergency Department (HOSPITAL_COMMUNITY): Payer: Medicaid Other

## 2019-04-03 ENCOUNTER — Emergency Department (HOSPITAL_COMMUNITY)
Admission: EM | Admit: 2019-04-03 | Discharge: 2019-04-03 | Disposition: A | Discharge status: Home / Self Care | Payer: Medicaid Other | Attending: Emergency Medicine | Admitting: Emergency Medicine

## 2019-04-03 ENCOUNTER — Other Ambulatory Visit: Payer: Self-pay

## 2019-04-03 DIAGNOSIS — R52 Pain, unspecified: Secondary | ICD-10-CM

## 2019-04-03 DIAGNOSIS — R0602 Shortness of breath: Secondary | ICD-10-CM | POA: Diagnosis not present

## 2019-04-03 DIAGNOSIS — Z20828 Contact with and (suspected) exposure to other viral communicable diseases: Secondary | ICD-10-CM | POA: Insufficient documentation

## 2019-04-03 DIAGNOSIS — I10 Essential (primary) hypertension: Secondary | ICD-10-CM | POA: Insufficient documentation

## 2019-04-03 DIAGNOSIS — F17210 Nicotine dependence, cigarettes, uncomplicated: Secondary | ICD-10-CM | POA: Diagnosis not present

## 2019-04-03 LAB — CBC WITH DIFFERENTIAL/PLATELET
Abs Immature Granulocytes: 0.04 10*3/uL (ref 0.00–0.07)
Basophils Absolute: 0 10*3/uL (ref 0.0–0.1)
Basophils Relative: 1 %
Eosinophils Absolute: 0.1 10*3/uL (ref 0.0–0.5)
Eosinophils Relative: 1 %
HCT: 30.6 % — ABNORMAL LOW (ref 36.0–46.0)
Hemoglobin: 9.9 g/dL — ABNORMAL LOW (ref 12.0–15.0)
Immature Granulocytes: 1 %
Lymphocytes Relative: 29 %
Lymphs Abs: 2.3 10*3/uL (ref 0.7–4.0)
MCH: 32.7 pg (ref 26.0–34.0)
MCHC: 32.4 g/dL (ref 30.0–36.0)
MCV: 101 fL — ABNORMAL HIGH (ref 80.0–100.0)
Monocytes Absolute: 0.8 10*3/uL (ref 0.1–1.0)
Monocytes Relative: 10 %
Neutro Abs: 4.7 10*3/uL (ref 1.7–7.7)
Neutrophils Relative %: 58 %
Platelets: 158 10*3/uL (ref 150–400)
RBC: 3.03 MIL/uL — ABNORMAL LOW (ref 3.87–5.11)
RDW: 13.8 % (ref 11.5–15.5)
WBC: 7.9 10*3/uL (ref 4.0–10.5)
nRBC: 0 % (ref 0.0–0.2)

## 2019-04-03 LAB — COMPREHENSIVE METABOLIC PANEL
ALT: 54 U/L — ABNORMAL HIGH (ref 0–44)
AST: 82 U/L — ABNORMAL HIGH (ref 15–41)
Albumin: 2.6 g/dL — ABNORMAL LOW (ref 3.5–5.0)
Alkaline Phosphatase: 73 U/L (ref 38–126)
Anion gap: 11 (ref 5–15)
BUN: 7 mg/dL (ref 6–20)
CO2: 22 mmol/L (ref 22–32)
Calcium: 8.5 mg/dL — ABNORMAL LOW (ref 8.9–10.3)
Chloride: 102 mmol/L (ref 98–111)
Creatinine, Ser: 0.99 mg/dL (ref 0.44–1.00)
GFR calc Af Amer: >60 mL/min (ref >60)
GFR calc non Af Amer: >60 mL/min (ref >60)
Glucose, Bld: 136 mg/dL — ABNORMAL HIGH (ref 70–99)
Potassium: 3.7 mmol/L (ref 3.5–5.1)
Sodium: 135 mmol/L (ref 135–145)
Total Bilirubin: 0.4 mg/dL (ref 0.3–1.2)
Total Protein: 6.5 g/dL (ref 6.5–8.1)

## 2019-04-03 LAB — URINALYSIS, MICROSCOPIC (REFLEX): RBC / HPF: >50 RBC/hpf (ref 0–5)

## 2019-04-03 LAB — URINALYSIS, ROUTINE W REFLEX MICROSCOPIC

## 2019-04-03 LAB — I-STAT BETA HCG BLOOD, ED (MC, WL, AP ONLY): I-stat hCG, quantitative: <5 m[IU]/mL (ref <5)

## 2019-04-03 LAB — TROPONIN I (HIGH SENSITIVITY)
Troponin I (High Sensitivity): 8 ng/L (ref <18)
Troponin I (High Sensitivity): 8 ng/L (ref <18)

## 2019-04-03 LAB — BRAIN NATRIURETIC PEPTIDE: B Natriuretic Peptide: 133.1 pg/mL — ABNORMAL HIGH (ref 0.0–100.0)

## 2019-04-03 MED ORDER — FUROSEMIDE 20 MG PO TABS: 20.0000 mg | ORAL_TABLET | Freq: Every day | ORAL | 0 refills | Status: DC

## 2019-04-03 MED ORDER — LIDOCAINE 5 % EX PTCH
1.0000 | MEDICATED_PATCH | CUTANEOUS | Status: DC
  Administered 2019-04-03: 1 via TRANSDERMAL
  Filled 2019-04-03: qty 1

## 2019-04-03 MED ORDER — ALBUTEROL SULFATE HFA 108 (90 BASE) MCG/ACT IN AERS
2.0000 | INHALATION_SPRAY | Freq: Once | RESPIRATORY_TRACT | Status: AC
  Administered 2019-04-03: 2 via RESPIRATORY_TRACT
  Filled 2019-04-03: qty 6.7

## 2019-04-03 MED ORDER — FUROSEMIDE 20 MG PO TABS
20.0000 mg | ORAL_TABLET | Freq: Once | ORAL | Status: AC
  Administered 2019-04-03: 16:00:00 20 mg via ORAL
  Filled 2019-04-03: qty 1

## 2019-04-03 MED ORDER — KETOROLAC TROMETHAMINE 15 MG/ML IJ SOLN
15.0000 mg | Freq: Once | INTRAMUSCULAR | Status: AC
  Administered 2019-04-03: 15 mg via INTRAVENOUS
  Filled 2019-04-03: qty 1

## 2019-04-03 NOTE — ED Triage Notes (Signed)
[facility]- pt here from home. Pt here for SOB. Pt has history of pneumonia. Pt here for same 2 weeks ago but left AMA.   150/90 90 HR 96% RA 97.6 temp

## 2019-04-03 NOTE — ED Notes (Signed)
Patient verbalizes understanding of discharge instructions. Opportunity for questioning and answers were provided. Armband removed by staff, pt discharged from ED via wheelchair to home.  

## 2019-04-03 NOTE — ED Provider Notes (Signed)
[facility]GENC[facility]PARTMENT Provider Note   CSN: [id number] Arrival date & time: 04/03/19  1304    History   Chief Complaint No chief complaint on file.   HPI Judy Robles is a 43 y.o. female presenting for evaluation of shortness of breath.  Patient reports shortness of breath for the past 2 weeks.  She was seen initially, but left prior to completion of work-up.  Patient states she is concerned about a possible pneumonia or fluid on her lungs.  She tried taking her home Lasix, which she had several days worth of, but has not taken anything else for her symptoms.  She reports chest tightness.  Shortness of breath is worse with exertion and when she lays flat.  She denies fevers, chills, sore throat, nausea, vomiting, no neck pain, urinary symptoms, normal bowel movements.  Patient states she fell yesterday, landed on her right side and injured the top of her foot and her right hip.  She denies sick contacts.  She denies contact with known COVID-19 positive person.  Patient states she is supposed to be taking several medications for depression, but stopped about a month ago because she does not like the way they make her feel.  She is currently not taking any medications.  Patient reports she smokes a pack of cigarettes a day, denies alcohol use.  She reports she experimented with cocaine 4 days ago, but she does not use it regularly.  Additional history obtained from chart review.  Patient with a history of opioid abuse, depression, benzodiazepine use, PTSD, polysubstance abuse, and hypertension. Pt states she has been told she has COPD, but it is not currently being treated with any inhalers.      HPI  Past Medical History:  Diagnosis Date  . Arthritis   . Bipolar 1 disorder (HCC)   . Depression   . Hypercholesterolemia   . Hypertension   . MRSA (methicillin resistant Staphylococcus aureus)   . Obesity   . Renal disease   . Sleep apnea     Patient Active  Problem List   Diagnosis Date Noted  . 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 MDD (major depressive disorder), severe (HCC) 12/18/2018  . HCAP (healthcare-associated pneumonia) 09/16/2018  . LLL pneumonia (HCC) 09/16/2018  . Sepsis (HCC) 09/16/2018  . Hyponatremia 09/16/2018  . Hypokalemia 09/16/2018  . Severe benzodiazepine use disorder (HCC) 07/10/2018  . Bipolar II disorder (HCC)   . Severe recurrent major depression without psychotic features (HCC) 06/05/2018  . PTSD (post-traumatic stress disorder) 11/21/2017  . Major depressive disorder, recurrent episode, moderate (HCC) 11/05/2017  . Community acquired pneumonia of right lower lobe of lung (HCC) 09/02/2017  . Sepsis associated hypotension (HCC) 09/02/2017  . AKI (acute kidney injury) (HCC) 09/02/2017  . Nausea and vomiting 09/02/2017  . Drug overdose 04/22/2017  . Chest pain 10/09/2016  . Pressure injury of skin 10/05/2016  . Polysubstance dependence including opioid type drug with complication, episodic abuse (HCC) 07/21/2016  . MDD (major depressive disorder), recurrent severe, without psychosis (HCC) 07/21/2016  . Chronic pain 07/03/2015  . Diarrhea 05/01/2014  . Primary hypertension 04/17/2014    Past Surgical History:  Procedure Laterality Date  . CESAREAN SECTION    . FRACTURE SURGERY Right    ankle  . I&D EXTREMITY Right 04/16/2014   Procedure: IRRIGATION AND DEBRIDEMENT EXTREMITY;  Surgeon: Sharma CovertFred W Ortmann, MD;  Location: [facility] OR;  Service: Orthopedics;  Laterality: Right;  OB History   No obstetric history on file.      Home Medications    Prior to Admission medications   Medication Sig Start Date End Date Taking? Authorizing Provider  furosemide (LASIX) 20 MG tablet Take 1 tablet (20 mg total) by mouth daily for 3 days. 04/03/19 04/06/19  Caprina Wussow, PA-C  gabapentin (NEURONTIN) 600 MG tablet Take 1 tablet (600 mg total) by mouth 3 (three) times daily. 12/21/18   Connye Burkitt, NP  lurasidone (LATUDA) 20 MG TABS tablet Take 1 tablet (20 mg total) by mouth daily with supper. For mood 12/21/18   Connye Burkitt, NP  nicotine (NICODERM CQ - DOSED IN MG/24 HOURS) 21 mg/24hr patch Place 1 patch (21 mg total) onto the skin daily. For smoking cessation 12/22/18   Connye Burkitt, NP  traZODone (DESYREL) 50 MG tablet Take 1 tablet (50 mg total) by mouth at bedtime as needed for sleep. 12/21/18   Connye Burkitt, NP  venlafaxine XR (EFFEXOR-XR) 150 MG 24 hr capsule Take 2 capsules (300 mg total) by mouth daily with breakfast. 12/22/18   Connye Burkitt, NP    Family History Family History  Problem Relation Age of Onset  . Throat cancer Mother   . Hypertension Father   . Stroke Father     Social History Social History   Tobacco Use  . Smoking status: Current Every Day Smoker    Packs/day: 1.00    Types: Cigarettes  . Smokeless tobacco: Never Used  . Tobacco comment: 1/2PPD  Substance Use Topics  . Alcohol use: Yes    Comment: rare, occasional  . Drug use: Yes    Types: Cocaine    Comment: occasional cocaine use     Allergies   Tylenol [acetaminophen] and Lisinopril   Review of Systems Review of Systems  Respiratory: Positive for chest tightness and shortness of breath.   All other systems reviewed and are negative.    Physical Exam Updated Vital Signs BP (!) 132/42   Pulse 85   Temp 100.2 F (37.9 C) (Oral)   Resp 19   SpO2 96%   Physical Exam Vitals signs and nursing note reviewed.  Constitutional:      General: She is not in acute distress.    Appearance: She is well-developed.     Comments: Obese female who is sitting in the bed in no acute distress  HENT:     Head: Normocephalic and atraumatic.  Eyes:     Extraocular Movements: Extraocular movements intact.     Conjunctiva/sclera: Conjunctivae normal.     Pupils: Pupils are equal, round, and reactive to light.  Neck:     Musculoskeletal: Normal range of motion and neck supple.   Cardiovascular:     Rate and Rhythm: Normal rate and regular rhythm.     Pulses: Normal pulses.  Pulmonary:     Effort: Pulmonary effort is normal. No respiratory distress.     Breath sounds: Normal breath sounds. No wheezing.     Comments: Speaking in full sentences.  Clear lung sounds in all fields. spo2 stable on RA. No tachypnea or respiratory distress.  Abdominal:     General: There is no distension.     Palpations: Abdomen is soft. There is no mass.     Tenderness: There is no abdominal tenderness. There is no guarding or rebound.  Musculoskeletal: Normal range of motion.     Right lower leg: No edema.  Left lower leg: No edema.     Comments: No pitting edema noted. Tenderness palpation of the dorsal aspect of the right foot.  Mild contusion.  Pedal pulses intact.  Sensation intact. Contusion of the right hip along the posterior lateral aspect.  No tenderness over the bony prominence. Soft compartments.  Skin:    General: Skin is warm and dry.     Capillary Refill: Capillary refill takes less than 2 seconds.  Neurological:     Mental Status: She is alert and oriented to person, place, and time.      ED Treatments / Results  Labs (all labs ordered are listed, but only abnormal results are displayed) Labs Reviewed  CBC WITH DIFFERENTIAL/PLATELET - Abnormal; Notable for the following components:      Result Value   RBC 3.03 (*)    Hemoglobin 9.9 (*)    HCT 30.6 (*)    MCV 101.0 (*)    All other components within normal limits  COMPREHENSIVE METABOLIC PANEL - Abnormal; Notable for the following components:   Glucose, Bld 136 (*)    Calcium 8.5 (*)    Albumin 2.6 (*)    AST 82 (*)    ALT 54 (*)    All other components within normal limits  BRAIN NATRIURETIC PEPTIDE - Abnormal; Notable for the following components:   B Natriuretic Peptide 133.1 (*)    All other components within normal limits  URINALYSIS, ROUTINE W REFLEX MICROSCOPIC - Abnormal; Notable for the  following components:   Color, Urine RED (*)    APPearance TURBID (*)    Glucose, UA   (*)    Value: TEST NOT REPORTED DUE TO COLOR INTERFERENCE OF URINE PIGMENT   Hgb urine dipstick   (*)    Value: TEST NOT REPORTED DUE TO COLOR INTERFERENCE OF URINE PIGMENT   Bilirubin Urine   (*)    Value: TEST NOT REPORTED DUE TO COLOR INTERFERENCE OF URINE PIGMENT   Ketones, ur   (*)    Value: TEST NOT REPORTED DUE TO COLOR INTERFERENCE OF URINE PIGMENT   Protein, ur   (*)    Value: TEST NOT REPORTED DUE TO COLOR INTERFERENCE OF URINE PIGMENT   Nitrite   (*)    Value: TEST NOT REPORTED DUE TO COLOR INTERFERENCE OF URINE PIGMENT   Leukocytes,Ua   (*)    Value: TEST NOT REPORTED DUE TO COLOR INTERFERENCE OF URINE PIGMENT   All other components within normal limits  URINALYSIS, MICROSCOPIC (REFLEX) - Abnormal; Notable for the following components:   Bacteria, UA FEW (*)    All other components within normal limits  NOVEL CORONAVIRUS, NAA (HOSPITAL ORDER, SEND-OUT TO REF LAB)  I-STAT BETA HCG BLOOD, ED ([facility], [facility], [facility] ONLY)  TROPONIN I (HIGH SENSITIVITY)  TROPONIN I (HIGH SENSITIVITY)    EKG EKG Interpretation  Date/Time:  Wednesday April 03 2019 13:13:11 EDT Ventricular Rate:  91 PR Interval:    QRS Duration: 125 QT Interval:  399 QTC Calculation: 491 R Axis:   67 Text Interpretation:  Sinus rhythm Short PR interval Nonspecific intraventricular conduction delay Baseline wander in lead(s) V1 Poor data quality in current ECG precludes serial comparison Confirmed by Pricilla Loveless [phone]) on 04/03/2019 1:20:36 PM   Radiology Dg Chest 2 View  Result Date: 04/03/2019 CLINICAL DATA:  Status post fall.  Shortness of breath. EXAM: CHEST - 2 VIEW COMPARISON:  March 17, 2019 FINDINGS: The heart size and mediastinal contours are within normal limits. Both  lungs are clear. The visualized skeletal structures are unremarkable. IMPRESSION: No active cardiopulmonary disease. Electronically Signed   By:  Sherian ReinWei-Chen  Lin M.D.   On: 04/03/2019 14:52   Dg Foot Complete Right  Result Date: 04/03/2019 CLINICAL DATA:  Status post fall with left foot pain. EXAM: RIGHT FOOT COMPLETE - 3+ VIEW COMPARISON:  October 01, 2016 FINDINGS: Postsurgical deformity of the fifth metatarsal with a screw in place noted. This is unchanged compared prior exam. No acute fracture or dislocation is identified. IMPRESSION: No acute fracture or dislocation identified. Chronic deformity of the fifth metatarsal unchanged compared prior exam of 2018. Electronically Signed   By: Sherian ReinWei-Chen  Lin M.D.   On: 04/03/2019 14:46   Dg Hip Unilat W Or Wo Pelvis 2-3 Views Right  Result Date: 04/03/2019 CLINICAL DATA:  Status post fall with right hip pain. EXAM: DG HIP (WITH OR WITHOUT PELVIS) 2-3V RIGHT COMPARISON:  None. FINDINGS: There is no acute fracture or dislocation. Mild degenerative joint changes of lower lumbar spine are noted. IMPRESSION: No acute fracture or dislocation noted. Electronically Signed   By: Sherian ReinWei-Chen  Lin M.D.   On: 04/03/2019 14:52    Procedures Procedures (including critical care time)  Medications Ordered in ED Medications  lidocaine (LIDODERM) 5 % 1 patch (1 patch Transdermal Patch Applied 04/03/19 1456)  ketorolac (TORADOL) 15 MG/ML injection 15 mg (15 mg Intravenous Given 04/03/19 1344)  albuterol (VENTOLIN HFA) 108 (90 Base) MCG/ACT inhaler 2 puff (2 puffs Inhalation Given 04/03/19 1605)  furosemide (LASIX) tablet 20 mg (20 mg Oral Given 04/03/19 1605)     Initial Impression / Assessment and Plan / ED Course  I have reviewed the triage vital signs and the nursing notes.  Pertinent labs & imaging results that were available during my care of the patient were reviewed by me and considered in my medical decision making (see chart for details).        Patient presenting for evaluation of 2-week history of shortness of breath.  Physical examination, she appears nontoxic.  Pulmonary exam reassuring, no  tachypnea or respiratory distress.  As she is also having chest tightness, consider COPD versus viral illness.  Consider pneumonia for prolonged shortness of breath.  Consider COVID.  Lower suspicion for ACS, however considering her drug use, will obtain cardiac work-up.  EKG reassuring, no STEMI.  X-ray viewed interpreted by me, no pneumonia, pneumothorax, effusion.  Labs show mild anemia and slight elevation in liver function, consider viral cause.  No leukocytosis to indicate infection.  BNP mildly elevated at 133, although improved from previous.  Troponin 8, similar to 2 weeks ago which was 7.  Will order repeat, but doubt ACS.  X-ray of foot and hip read interpreted by me, no fracture dislocation.  Likely contusion versus sprain.  Will order COVID send out test, but at this time I do not believe she needs to be admitted.  Will trial albuterol for sx control, as pt may have mild copd and/or viral illness. Will trial Lasix for symptom improvement due to mild elevation in bnp.   Repeat troponin negative. Encourage close follow-up with primary care.  At this time, patient appears safe for discharge.  Return precautions given.  Patient states she understands and agrees to plan.  Final Clinical Impressions(s) / ED Diagnoses   Final diagnoses:  Shortness of breath    ED Discharge Orders         Ordered    furosemide (LASIX) 20 MG tablet  Daily  04/03/19 1550           Alveria Apley, PA-C 04/03/19 1606    Pricilla Loveless, MD 04/04/19 [phone]

## 2019-04-03 NOTE — ED Notes (Signed)
Patient transported to X-ray 

## 2019-04-03 NOTE — ED Notes (Signed)
Pt ambulatory to bathroom to provide urine sample

## 2019-04-03 NOTE — Discharge Instructions (Signed)
Your work up today was reassuring. There was no sign of infection (pneumonia) in your lungs.  The labs showed you are not having any heart damage.  You were tested for covid. Results should return in several days. If positive, you should receive a phone call. If negative, you will not. Either way, you may check online on mychart. Your xrays showed no broken bones. You should treat your pain with ice, ibuprofen, muscle creams/patches (salonpas, icy hot, bengay, biofreeze), and time.  Take lasix for the next few days to see if this helps your breathing.  You may also try the albuterol inhaler to see if this helps.  I recommend you follow up with your primary care doctor later this week or early next week for recheck of symptoms.  Return to the ER with any new, worsening, or concerning symptoms.

## 2019-04-04 LAB — NOVEL CORONAVIRUS, NAA (HOSP ORDER, SEND-OUT TO REF LAB; TAT 18-24 HRS): SARS-CoV-2, NAA: NOT DETECTED

## 2019-07-23 ENCOUNTER — Other Ambulatory Visit: Payer: Self-pay

## 2019-07-23 DIAGNOSIS — Z20822 Contact with and (suspected) exposure to covid-19: Secondary | ICD-10-CM

## 2019-07-25 LAB — NOVEL CORONAVIRUS, NAA: SARS-CoV-2, NAA: NOT DETECTED

## 2019-10-11 ENCOUNTER — Emergency Department (HOSPITAL_COMMUNITY)
Admission: EM | Admit: 2019-10-11 | Discharge: 2019-10-12 | Disposition: A | Discharge status: Home / Self Care | Payer: Medicaid Other | Attending: Emergency Medicine | Admitting: Emergency Medicine

## 2019-10-11 ENCOUNTER — Encounter (HOSPITAL_COMMUNITY): Payer: Self-pay

## 2019-10-11 DIAGNOSIS — I10 Essential (primary) hypertension: Secondary | ICD-10-CM | POA: Insufficient documentation

## 2019-10-11 DIAGNOSIS — F17210 Nicotine dependence, cigarettes, uncomplicated: Secondary | ICD-10-CM | POA: Insufficient documentation

## 2019-10-11 DIAGNOSIS — R0602 Shortness of breath: Secondary | ICD-10-CM | POA: Insufficient documentation

## 2019-10-11 DIAGNOSIS — R2243 Localized swelling, mass and lump, lower limb, bilateral: Secondary | ICD-10-CM | POA: Insufficient documentation

## 2019-10-11 DIAGNOSIS — R1110 Vomiting, unspecified: Secondary | ICD-10-CM | POA: Diagnosis not present

## 2019-10-11 DIAGNOSIS — Z79899 Other long term (current) drug therapy: Secondary | ICD-10-CM | POA: Insufficient documentation

## 2019-10-11 DIAGNOSIS — R5383 Other fatigue: Secondary | ICD-10-CM | POA: Insufficient documentation

## 2019-10-11 DIAGNOSIS — R55 Syncope and collapse: Secondary | ICD-10-CM | POA: Diagnosis present

## 2019-10-11 DIAGNOSIS — M7989 Other specified soft tissue disorders: Secondary | ICD-10-CM

## 2019-10-11 LAB — CBC
HCT: 37.4 % (ref 36.0–46.0)
Hemoglobin: 11.7 g/dL — ABNORMAL LOW (ref 12.0–15.0)
MCH: 30.9 pg (ref 26.0–34.0)
MCHC: 31.3 g/dL (ref 30.0–36.0)
MCV: 98.7 fL (ref 80.0–100.0)
Platelets: 172 10*3/uL (ref 150–400)
RBC: 3.79 MIL/uL — ABNORMAL LOW (ref 3.87–5.11)
RDW: 13.5 % (ref 11.5–15.5)
WBC: 6.2 10*3/uL (ref 4.0–10.5)
nRBC: 0 % (ref 0.0–0.2)

## 2019-10-11 LAB — URINALYSIS, ROUTINE W REFLEX MICROSCOPIC
Bilirubin Urine: NEGATIVE
Glucose, UA: NEGATIVE mg/dL
Hgb urine dipstick: NEGATIVE
Ketones, ur: NEGATIVE mg/dL
Leukocytes,Ua: NEGATIVE
Nitrite: NEGATIVE
Protein, ur: NEGATIVE mg/dL
Specific Gravity, Urine: 1.017 (ref 1.005–1.030)
pH: 6 (ref 5.0–8.0)

## 2019-10-11 LAB — I-STAT BETA HCG BLOOD, ED (MC, WL, AP ONLY): I-stat hCG, quantitative: <5 m[IU]/mL (ref <5)

## 2019-10-11 MED ORDER — ONDANSETRON 4 MG PO TBDP
4.0000 mg | ORAL_TABLET | Freq: Once | ORAL | Status: DC | PRN
  Filled 2019-10-11: qty 1

## 2019-10-11 MED ORDER — SODIUM CHLORIDE 0.9% FLUSH: 3.0000 mL | Freq: Once | INTRAVENOUS | Status: DC

## 2019-10-11 NOTE — ED Triage Notes (Signed)
Pt comes from home via [facility] for vomiting and generalized weakness, pt started a new medication today called Atomoxetine.

## 2019-10-12 ENCOUNTER — Emergency Department (HOSPITAL_COMMUNITY): Payer: Medicaid Other

## 2019-10-12 LAB — COMPREHENSIVE METABOLIC PANEL
ALT: 41 U/L (ref 0–44)
AST: 70 U/L — ABNORMAL HIGH (ref 15–41)
Albumin: 2.8 g/dL — ABNORMAL LOW (ref 3.5–5.0)
Alkaline Phosphatase: 70 U/L (ref 38–126)
Anion gap: 9 (ref 5–15)
BUN: 8 mg/dL (ref 6–20)
CO2: 25 mmol/L (ref 22–32)
Calcium: 8.7 mg/dL — ABNORMAL LOW (ref 8.9–10.3)
Chloride: 103 mmol/L (ref 98–111)
Creatinine, Ser: 0.9 mg/dL (ref 0.44–1.00)
GFR calc Af Amer: >60 mL/min (ref >60)
GFR calc non Af Amer: >60 mL/min (ref >60)
Glucose, Bld: 124 mg/dL — ABNORMAL HIGH (ref 70–99)
Potassium: 3.5 mmol/L (ref 3.5–5.1)
Sodium: 137 mmol/L (ref 135–145)
Total Bilirubin: 0.4 mg/dL (ref 0.3–1.2)
Total Protein: 6.8 g/dL (ref 6.5–8.1)

## 2019-10-12 LAB — BRAIN NATRIURETIC PEPTIDE: B Natriuretic Peptide: 65.1 pg/mL (ref 0.0–100.0)

## 2019-10-12 LAB — TROPONIN I (HIGH SENSITIVITY): Troponin I (High Sensitivity): 3 ng/L (ref <18)

## 2019-10-12 LAB — LIPASE, BLOOD: Lipase: 25 U/L (ref 11–51)

## 2019-10-12 NOTE — ED Provider Notes (Signed)
[facility] EMERGENCY DEPARTMENT Provider Note   CSN: [id number] Arrival date & time: 10/11/19  2237     History Chief Complaint  Patient presents with  . Emesis  . Weakness    Judy Robles is a 44 y.o. female.  Patient with past medical history notable for bipolar, depression, polysubstance abuse, renal disease, hepatitis C, and self reports history of CHF, presents to the emergency department with a chief complaint of syncope.  She states that she was feeling very worn out today.  States that she had several episodes of vomiting and passed out.  She reports increased swelling around her ankles and on her legs.  Reports feeling short of breath and having chest pain.  She denies any fever, chills, cough.  States that she was hospitalized once in [place] for similar symptoms and had to be diuresed.  Denies any treatments prior to arrival.  The history is provided by the patient. No language interpreter was used.       Past Medical History:  Diagnosis Date  . Arthritis   . Bipolar 1 disorder (HCC)   . Depression   . Hypercholesterolemia   . Hypertension   . MRSA (methicillin resistant Staphylococcus aureus)   . Obesity   . Renal disease   . Sleep apnea     Patient Active Problem List   Diagnosis Date Noted  . 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 MDD (major depressive disorder), severe (HCC) 12/18/2018  . HCAP (healthcare-associated pneumonia) 09/16/2018  . LLL pneumonia 09/16/2018  . Sepsis (HCC) 09/16/2018  . Hyponatremia 09/16/2018  . Hypokalemia 09/16/2018  . Severe benzodiazepine use disorder (HCC) 07/10/2018  . Bipolar II disorder (HCC)   . Severe recurrent major depression without psychotic features (HCC) 06/05/2018  . PTSD (post-traumatic stress disorder) 11/21/2017  . Major depressive disorder, recurrent episode, moderate (HCC) 11/05/2017  . Community acquired pneumonia of right lower lobe of lung 09/02/2017  .  Sepsis associated hypotension (HCC) 09/02/2017  . AKI (acute kidney injury) (HCC) 09/02/2017  . Nausea and vomiting 09/02/2017  . Drug overdose 04/22/2017  . Chest pain 10/09/2016  . Pressure injury of skin 10/05/2016  . Polysubstance dependence including opioid type drug with complication, episodic abuse (HCC) 07/21/2016  . MDD (major depressive disorder), recurrent severe, without psychosis (HCC) 07/21/2016  . Chronic pain 07/03/2015  . Diarrhea 05/01/2014  . Primary hypertension 04/17/2014    Past Surgical History:  Procedure Laterality Date  . CESAREAN SECTION    . FRACTURE SURGERY Right    ankle  . I & D EXTREMITY Right 04/16/2014   Procedure: IRRIGATION AND DEBRIDEMENT EXTREMITY;  Surgeon: Sharma Covert, MD;  Location: [facility] OR;  Service: Orthopedics;  Laterality: Right;     OB History   No obstetric history on file.     Family History  Problem Relation Age of Onset  . Throat cancer Mother   . Hypertension Father   . Stroke Father     Social History   Tobacco Use  . Smoking status: Current Every Day Smoker    Packs/day: 1.00    Types: Cigarettes  . Smokeless tobacco: Never Used  . Tobacco comment: 1/2PPD  Substance Use Topics  . Alcohol use: Yes    Comment: rare, occasional  . Drug use: Yes    Types: Cocaine    Comment: occasional cocaine use    Home Medications Prior to Admission medications   Medication Sig Start Date End Date Taking?  Authorizing Provider  furosemide (LASIX) 20 MG tablet Take 1 tablet (20 mg total) by mouth daily for 3 days. 04/03/19 04/06/19  Caccavale, Sophia, PA-C  gabapentin (NEURONTIN) 600 MG tablet Take 1 tablet (600 mg total) by mouth 3 (three) times daily. 12/21/18   Connye Burkitt, NP  lurasidone (LATUDA) 20 MG TABS tablet Take 1 tablet (20 mg total) by mouth daily with supper. For mood 12/21/18   Connye Burkitt, NP  nicotine (NICODERM CQ - DOSED IN MG/24 HOURS) 21 mg/24hr patch Place 1 patch (21 mg total) onto the skin daily. For  smoking cessation 12/22/18   Connye Burkitt, NP  traZODone (DESYREL) 50 MG tablet Take 1 tablet (50 mg total) by mouth at bedtime as needed for sleep. 12/21/18   Connye Burkitt, NP  venlafaxine XR (EFFEXOR-XR) 150 MG 24 hr capsule Take 2 capsules (300 mg total) by mouth daily with breakfast. 12/22/18   Connye Burkitt, NP    Allergies    Tylenol [acetaminophen] and Lisinopril  Review of Systems   Review of Systems  All other systems reviewed and are negative.   Physical Exam Updated Vital Signs BP 110/68   Pulse 80   Temp (!) 97.5 F (36.4 C) (Oral)   Resp 20   SpO2 100%   Physical Exam Vitals and nursing note reviewed.  Constitutional:      General: She is not in acute distress.    Appearance: She is well-developed.  HENT:     Head: Normocephalic and atraumatic.  Eyes:     Conjunctiva/sclera: Conjunctivae normal.  Cardiovascular:     Rate and Rhythm: Normal rate and regular rhythm.     Heart sounds: No murmur.  Pulmonary:     Effort: Pulmonary effort is normal. No respiratory distress.     Breath sounds: Normal breath sounds.     Comments: Lungs are clear to auscultation Abdominal:     Palpations: Abdomen is soft.     Tenderness: There is no abdominal tenderness.  Musculoskeletal:        General: Normal range of motion.     Cervical back: Neck supple.     Right lower leg: Edema present.     Left lower leg: Edema present.     Comments: 2+ pitting edema in bilateral lower extremities  Skin:    General: Skin is warm and dry.  Neurological:     Mental Status: She is alert and oriented to person, place, and time.  Psychiatric:        Mood and Affect: Mood normal.        Behavior: Behavior normal.     ED Results / Procedures / Treatments   Labs (all labs ordered are listed, but only abnormal results are displayed) Labs Reviewed  COMPREHENSIVE METABOLIC PANEL - Abnormal; Notable for the following components:      Result Value   Glucose, Bld 124 (*)    Calcium 8.7  (*)    Albumin 2.8 (*)    AST 70 (*)    All other components within normal limits  CBC - Abnormal; Notable for the following components:   RBC 3.79 (*)    Hemoglobin 11.7 (*)    All other components within normal limits  URINALYSIS, ROUTINE W REFLEX MICROSCOPIC - Abnormal; Notable for the following components:   APPearance CLOUDY (*)    All other components within normal limits  LIPASE, BLOOD  BRAIN NATRIURETIC PEPTIDE  I-STAT BETA HCG BLOOD, ED ([facility], [facility],  [facility] ONLY)  TROPONIN I (HIGH SENSITIVITY)    EKG EKG Interpretation  Date/Time:  Saturday October 12 2019 02:52:30 EST Ventricular Rate:  77 PR Interval:  152 QRS Duration: 88 QT Interval:  411 QTC Calculation: 466 R Axis:   56 Text Interpretation: Sinus rhythm No significant change since last tracing Confirmed by Zadie Rhine ([id number]) on 10/12/2019 3:06:21 AM   Radiology DG Chest Port 1 View  Result Date: 10/12/2019 CLINICAL DATA:  Shortness of breath.  Vomiting and weakness. EXAM: PORTABLE CHEST 1 VIEW COMPARISON:  04/03/2019 FINDINGS: The heart size and mediastinal contours are within normal limits. Both lungs are clear. The visualized skeletal structures are unremarkable. IMPRESSION: No active disease. Electronically Signed   By: Paulina Fusi M.D.   On: 10/12/2019 03:09    Procedures Procedures (including critical care time)  Medications Ordered in ED Medications  sodium chloride flush (NS) 0.9 % injection 3 mL (has no administration in time range)  ondansetron (ZOFRAN-ODT) disintegrating tablet 4 mg (4 mg Oral Incomplete 10/11/19 2247)    ED Course  I have reviewed the triage vital signs and the nursing notes.  Pertinent labs & imaging results that were available during my care of the patient were reviewed by me and considered in my medical decision making (see chart for details).    MDM Rules/Calculators/A&P                      Patient with multiple episodes of vomiting today and syncopal episode.  She  reports associated shortness of breath and chest pain.  States that she has been having these symptoms for a while along with increased swelling of her extremities and weight gain.  She believes that she has been diagnosed with CHF in the past, though this is not documented in her medical history.  She states that she was admitted interim in the past for diuresis.  She does not take a fluid pill.  Laboratory work-up thus far is fairly reassuring.  Creatinine is normal, no electrolyte derangement.  Check chest x-ray, BNP, troponin.  I doubt PE, patient is not hypoxic nor tachycardic.  She does report history of liver disease.  Overall, patient is generally well-appearing.  I doubt that she will need admission to the hospital, and will likely need to follow-up with her doctor on an outpatient basis.  BNP and troponin are normal.  No evidence of fluid overload on CXR.  Patient is stable appearing and in no acute distress.   I recommended that she use compression stockings and try to walk more to help with her leg swelling.  I have advised her to follow-up with a PCP and have placed a consult for social work to assist with this if possible.   Final Clinical Impression(s) / ED Diagnoses Final diagnoses:  Other fatigue  Leg swelling  Vomiting  Rx / DC Orders ED Discharge Orders    None       Roxy Horseman, PA-C 10/12/19 0448    Zadie Rhine, MD 10/12/19 [phone]

## 2019-10-12 NOTE — Discharge Instructions (Signed)
Your laboratory workup doesn't show any clear reason for your symptoms.  You should be seen by a primary care doctor to have further workup performed.  I recommend compression stockings for your leg swelling.    If your symptoms change or worsen, please return to the ER.

## 2019-10-12 NOTE — ED Notes (Signed)
Pt ambulated in hall with no assistance, O2 remained between 92-96 while ambulating.

## 2019-10-12 NOTE — ED Notes (Signed)
Pt reports she is "having episodes of hallucinations," extreme weakness, nausea and "I'm sleeping all the time."

## 2019-10-16 ENCOUNTER — Other Ambulatory Visit: Payer: Self-pay

## 2019-10-16 ENCOUNTER — Emergency Department (HOSPITAL_COMMUNITY)
Admission: EM | Admit: 2019-10-16 | Discharge: 2019-10-16 | Disposition: A | Discharge status: Home / Self Care | Payer: Medicaid Other | Attending: Emergency Medicine | Admitting: Emergency Medicine

## 2019-10-16 ENCOUNTER — Encounter (HOSPITAL_COMMUNITY): Payer: Self-pay | Admitting: *Deleted

## 2019-10-16 DIAGNOSIS — Z041 Encounter for examination and observation following transport accident: Secondary | ICD-10-CM | POA: Insufficient documentation

## 2019-10-16 DIAGNOSIS — Z5321 Procedure and treatment not carried out due to patient leaving prior to being seen by health care provider: Secondary | ICD-10-CM | POA: Insufficient documentation

## 2019-10-16 NOTE — ED Notes (Signed)
Pt decided to leave to go to the Urgent care due to wait
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
## 2020-01-12 DIAGNOSIS — R9431 Abnormal electrocardiogram [ECG] [EKG]: Secondary | ICD-10-CM | POA: Diagnosis not present

## 2020-01-12 DIAGNOSIS — E876 Hypokalemia: Secondary | ICD-10-CM

## 2020-01-12 DIAGNOSIS — E8809 Other disorders of plasma-protein metabolism, not elsewhere classified: Secondary | ICD-10-CM | POA: Diagnosis not present

## 2020-01-12 DIAGNOSIS — R7401 Elevation of levels of liver transaminase levels: Secondary | ICD-10-CM | POA: Diagnosis not present

## 2020-01-12 DIAGNOSIS — N179 Acute kidney failure, unspecified: Secondary | ICD-10-CM

## 2020-01-12 DIAGNOSIS — E871 Hypo-osmolality and hyponatremia: Secondary | ICD-10-CM | POA: Diagnosis not present

## 2020-01-13 DIAGNOSIS — E871 Hypo-osmolality and hyponatremia: Secondary | ICD-10-CM | POA: Diagnosis not present

## 2020-01-13 DIAGNOSIS — R7401 Elevation of levels of liver transaminase levels: Secondary | ICD-10-CM | POA: Diagnosis not present

## 2020-01-13 DIAGNOSIS — I361 Nonrheumatic tricuspid (valve) insufficiency: Secondary | ICD-10-CM | POA: Diagnosis not present

## 2020-01-13 DIAGNOSIS — R9431 Abnormal electrocardiogram [ECG] [EKG]: Secondary | ICD-10-CM | POA: Diagnosis not present

## 2020-01-13 DIAGNOSIS — E8809 Other disorders of plasma-protein metabolism, not elsewhere classified: Secondary | ICD-10-CM | POA: Diagnosis not present

## 2020-02-04 ENCOUNTER — Emergency Department (HOSPITAL_COMMUNITY)
Admission: EM | Admit: 2020-02-04 | Discharge: 2020-02-04 | Disposition: A | Discharge status: Home / Self Care | Payer: Medicaid Other | Attending: Emergency Medicine | Admitting: Emergency Medicine

## 2020-02-04 ENCOUNTER — Other Ambulatory Visit: Payer: Self-pay

## 2020-02-04 DIAGNOSIS — I10 Essential (primary) hypertension: Secondary | ICD-10-CM | POA: Diagnosis not present

## 2020-02-04 DIAGNOSIS — R4689 Other symptoms and signs involving appearance and behavior: Secondary | ICD-10-CM

## 2020-02-04 DIAGNOSIS — O99341 Other mental disorders complicating pregnancy, first trimester: Secondary | ICD-10-CM | POA: Diagnosis not present

## 2020-02-04 DIAGNOSIS — F17210 Nicotine dependence, cigarettes, uncomplicated: Secondary | ICD-10-CM | POA: Diagnosis not present

## 2020-02-04 DIAGNOSIS — Z3A12 12 weeks gestation of pregnancy: Secondary | ICD-10-CM | POA: Insufficient documentation

## 2020-02-04 LAB — CBC WITH DIFFERENTIAL/PLATELET
Abs Immature Granulocytes: 0.05 10*3/uL (ref 0.00–0.07)
Basophils Absolute: 0.1 10*3/uL (ref 0.0–0.1)
Basophils Relative: 1 %
Eosinophils Absolute: 0.1 10*3/uL (ref 0.0–0.5)
Eosinophils Relative: 1 %
HCT: 39.1 % (ref 36.0–46.0)
Hemoglobin: 12.9 g/dL (ref 12.0–15.0)
Immature Granulocytes: 0 %
Lymphocytes Relative: 21 %
Lymphs Abs: 2.7 10*3/uL (ref 0.7–4.0)
MCH: 33.1 pg (ref 26.0–34.0)
MCHC: 33 g/dL (ref 30.0–36.0)
MCV: 100.3 fL — ABNORMAL HIGH (ref 80.0–100.0)
Monocytes Absolute: 1.1 10*3/uL — ABNORMAL HIGH (ref 0.1–1.0)
Monocytes Relative: 8 %
Neutro Abs: 9.2 10*3/uL — ABNORMAL HIGH (ref 1.7–7.7)
Neutrophils Relative %: 69 %
Platelets: 182 10*3/uL (ref 150–400)
RBC: 3.9 MIL/uL (ref 3.87–5.11)
RDW: 14.3 % (ref 11.5–15.5)
WBC: 13.2 10*3/uL — ABNORMAL HIGH (ref 4.0–10.5)
nRBC: 0 % (ref 0.0–0.2)

## 2020-02-04 LAB — SALICYLATE LEVEL: Salicylate Lvl: <7 mg/dL — ABNORMAL LOW (ref 7.0–30.0)

## 2020-02-04 LAB — COMPREHENSIVE METABOLIC PANEL
ALT: 72 U/L — ABNORMAL HIGH (ref 0–44)
AST: 123 U/L — ABNORMAL HIGH (ref 15–41)
Albumin: 3.8 g/dL (ref 3.5–5.0)
Alkaline Phosphatase: 71 U/L (ref 38–126)
Anion gap: 13 (ref 5–15)
BUN: 26 mg/dL — ABNORMAL HIGH (ref 6–20)
CO2: 19 mmol/L — ABNORMAL LOW (ref 22–32)
Calcium: 9.1 mg/dL (ref 8.9–10.3)
Chloride: 107 mmol/L (ref 98–111)
Creatinine, Ser: 3.03 mg/dL — ABNORMAL HIGH (ref 0.44–1.00)
GFR calc Af Amer: 21 mL/min — ABNORMAL LOW (ref >60)
GFR calc non Af Amer: 18 mL/min — ABNORMAL LOW (ref >60)
Glucose, Bld: 106 mg/dL — ABNORMAL HIGH (ref 70–99)
Potassium: 3.6 mmol/L (ref 3.5–5.1)
Sodium: 139 mmol/L (ref 135–145)
Total Bilirubin: 1.4 mg/dL — ABNORMAL HIGH (ref 0.3–1.2)
Total Protein: 8 g/dL (ref 6.5–8.1)

## 2020-02-04 LAB — ACETAMINOPHEN LEVEL: Acetaminophen (Tylenol), Serum: 13 ug/mL (ref 10–30)

## 2020-02-04 LAB — ETHANOL: Alcohol, Ethyl (B): <10 mg/dL (ref <10)

## 2020-02-04 NOTE — ED Notes (Addendum)
Patient woke up, is currently alert and oriented and asking for food and drink. Patient is appropriately moving all extremities. MD made aware that patient is awake.

## 2020-02-04 NOTE — ED Provider Notes (Signed)
[facility]EMERGENCY [facility]PT Provider Note   CSN: [id number] Arrival date & time: 02/04/20  1548     History Chief Complaint  Patient presents with   Medical Clearance    meth use    Judy Robles is a 44 y.o. female.  44yo F w/ PMH below including bipolar d/o, substance abuse, OSA, HTN, HLD who p/w abnormal behavior.  EMS reported that they picked the patient up from [facility] where she was noted to be banging her head against the wall.  She required 5 mg of Haldol prior to arrival.  Patient is currently somnolent and not providing much history.  She admits to using meth 3 days ago but states "that was a one-time thing." She says she went to [place] with a friend.  LEVEL 5 CAVEAT DUE TO AMS  The history is provided by the EMS personnel. The history is limited by the condition of the patient.       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   MDD (major depressive disorder), severe (HCC) 12/18/2018   HCAP (healthcare-associated pneumonia) 09/16/2018   LLL pneumonia 09/16/2018   Sepsis (HCC) 09/16/2018   Hyponatremia 09/16/2018   Hypokalemia 09/16/2018   Severe benzodiazepine use disorder (HCC) 07/10/2018   Bipolar II disorder (HCC)    Severe recurrent major depression without psychotic features (HCC) 06/05/2018   PTSD (post-traumatic stress disorder) 11/21/2017   Major depressive disorder, recurrent episode, moderate (HCC) 11/05/2017   Community acquired pneumonia of right lower lobe of lung 09/02/2017   Sepsis associated hypotension (HCC) 09/02/2017   AKI (acute Robles injury) (HCC) 09/02/2017   Nausea  and vomiting 09/02/2017   Drug overdose 04/22/2017   Chest pain 10/09/2016   Pressure injury of skin 10/05/2016   Polysubstance dependence including opioid type drug with complication, episodic abuse (HCC) 07/21/2016   MDD (major depressive disorder), recurrent severe, without psychosis (HCC) 07/21/2016   Chronic pain 07/03/2015   Diarrhea 05/01/2014   Primary hypertension 04/17/2014    Past Surgical History:  Procedure Laterality Date   CESAREAN SECTION     FRACTURE SURGERY Right    ankle   I & D EXTREMITY Right 04/16/2014   Procedure: IRRIGATION AND DEBRIDEMENT EXTREMITY;  Surgeon: Sharma Covert, MD;  Location: [facility] OR;  Service: Orthopedics;  Laterality: Right;     OB History   No obstetric history on file.     Family History  Problem Relation Age of Onset   Throat cancer Mother    Hypertension Father    Stroke Father     Social History   Tobacco Use   Smoking status: Current Every Day Smoker    Packs/day: 1.00    Types: Cigarettes   Smokeless tobacco: Never Used   Tobacco comment: 1/2PPD  Vaping Use   Vaping Use: Never used  Substance Use Topics   Alcohol use: Yes    Comment: rare, occasional   Drug use: Yes    Types: Cocaine    Comment: occasional cocaine use    Home Medications Prior to Admission medications   Medication Sig Start Date  End Date Taking? Authorizing Provider  gabapentin (NEURONTIN) 400 MG capsule Take 800 mg by mouth 4 (four) times daily.  08/29/19  Yes [provider]  Oxycodone HCl 10 MG TABS Take 10 mg by mouth every 4 (four) hours as needed (pain).  02/03/20  Yes [provider]  furosemide (LASIX) 20 MG tablet Take 1 tablet (20 mg total) by mouth daily for 3 days. 04/03/19 04/06/19  Caccavale, Sophia, PA-C  gabapentin (NEURONTIN) 600 MG tablet Take 1 tablet (600 mg total) by mouth 3 (three) times daily. Patient not taking: Reported on 02/04/2020 12/21/18   Aldean Baker, NP  lurasidone (LATUDA) 20 MG TABS  tablet Take 1 tablet (20 mg total) by mouth daily with supper. For mood Patient not taking: Reported on 02/04/2020 12/21/18   Aldean Baker, NP  nicotine (NICODERM CQ - DOSED IN MG/24 HOURS) 21 mg/24hr patch Place 1 patch (21 mg total) onto the skin daily. For smoking cessation Patient not taking: Reported on 02/04/2020 12/22/18   Aldean Baker, NP  traZODone (DESYREL) 50 MG tablet Take 1 tablet (50 mg total) by mouth at bedtime as needed for sleep. Patient not taking: Reported on 02/04/2020 12/21/18   Aldean Baker, NP  venlafaxine XR (EFFEXOR-XR) 150 MG 24 hr capsule Take 2 capsules (300 mg total) by mouth daily with breakfast. Patient not taking: Reported on 02/04/2020 12/22/18   Aldean Baker, NP    Allergies    Tylenol [acetaminophen] and Lisinopril  Review of Systems   Review of Systems  Unable to perform ROS: Mental status change    Physical Exam Updated Vital Signs BP 126/84 (BP Location: Right Arm)    Pulse 97    Temp 98.6 F (37 C) (Oral)    Resp 18    SpO2 94%   Physical Exam Vitals and nursing note reviewed.  Constitutional:      General: She is not in acute distress.    Appearance: She is well-developed.     Comments: Asleep in bed  HENT:     Head: Normocephalic and atraumatic.  Eyes:     Conjunctiva/sclera: Conjunctivae normal.  Pulmonary:     Effort: Pulmonary effort is normal.  Musculoskeletal:        General: No deformity.     Cervical back: Neck supple.  Skin:    General: Skin is warm and dry.     Comments: No obvious track marks on arms  Neurological:     Comments: Slurred speech, falls asleep quickly during conversation, moving all 4 extremities equally  Psychiatric:     Comments: Disheveled, somnolent     ED Results / Procedures / Treatments   Labs (all labs ordered are listed, but only abnormal results are displayed) Labs Reviewed  COMPREHENSIVE METABOLIC PANEL - Abnormal; Notable for the following components:      Result Value   CO2 19 (*)     Glucose, Bld 106 (*)    BUN 26 (*)    Creatinine, Ser 3.03 (*)    AST 123 (*)    ALT 72 (*)    Total Bilirubin 1.4 (*)    GFR calc non Af Amer 18 (*)    GFR calc Af Amer 21 (*)    All other components within normal limits  CBC WITH DIFFERENTIAL/PLATELET - Abnormal; Notable for the following components:   WBC 13.2 (*)    MCV 100.3 (*)    Neutro Abs 9.2 (*)    Monocytes Absolute  1.1 (*)    All other components within normal limits  SALICYLATE LEVEL - Abnormal; Notable for the following components:   Salicylate Lvl <4.2 (*)    All other components within normal limits  ACETAMINOPHEN LEVEL  ETHANOL  RAPID URINE DRUG SCREEN, HOSP PERFORMED  HCG, QUANTITATIVE, PREGNANCY    EKG None  Radiology No results found.  Procedures Procedures (including critical care time)  Medications Ordered in ED Medications - No data to display  ED Course  I have reviewed the triage vital signs and the nursing notes.  Pertinent labs that were available during my care of the patient were reviewed by me and considered in my medical decision making (see chart for details).    MDM Rules/Calculators/A&P                          Patient initially somnolent likely secondary to Haldol she had received from EMS prior to arrival.  Patient observed in the ED for several hours until she returned to baseline mental status.  On reassessment, she was ambulatory in the room and had fluent speech.  She stated that she has a history of depression but does not feel like her depression is severe currently and she vehemently denies suicidal ideation.  She reports anger towards a neighbor but states that she does not want to harm him and denies any other thoughts of wanting to harm others.  She reports recent stressors of son who is using heroin and she is concerned about his risk of overdose as well as a son who was murdered.  Her main concerns revolve around her living situation.  She denies frequent methamphetamine  use, states that she only used it 3 days ago one time.  She denies any hallucinations.  She has no acute psychiatric problems and I do not feel she needs any further psychiatric assessment here.  I have reviewed return precautions with her and she voiced understanding. Final Clinical Impression(s) / ED Diagnoses Final diagnoses:  Abnormal behavior    Rx / DC Orders ED Discharge Orders    None       Leonila Speranza, Wenda Overland, MD 02/04/20 [phone]

## 2020-02-04 NOTE — ED Triage Notes (Signed)
Pt brought to [facility] by EMS. Pt was [employer]. When EMS picked up pt, pt was hitting head on glass,  Pt used meth 3 days ago. Pt has not slept. Pt given 5 haldol IV on transportation. Pt in bed, resting.

## 2020-02-04 NOTE — ED Notes (Signed)
Call received from pt mother Raneen Jaffer [phone] requesting rtn call for pt status/updates when possible. [employer]

## 2020-02-05 LAB — HCG, QUANTITATIVE, PREGNANCY: hCG, Beta Chain, Quant, S: 1 m[IU]/mL (ref <5)

## 2020-02-18 ENCOUNTER — Emergency Department (HOSPITAL_COMMUNITY): Payer: Medicaid Other

## 2020-02-18 ENCOUNTER — Emergency Department (HOSPITAL_COMMUNITY)
Admission: EM | Admit: 2020-02-18 | Discharge: 2020-02-18 | Disposition: A | Discharge status: Home / Self Care | Payer: Medicaid Other | Attending: Emergency Medicine | Admitting: Emergency Medicine

## 2020-02-18 ENCOUNTER — Encounter (HOSPITAL_COMMUNITY): Payer: Self-pay

## 2020-02-18 DIAGNOSIS — R112 Nausea with vomiting, unspecified: Secondary | ICD-10-CM | POA: Diagnosis not present

## 2020-02-18 DIAGNOSIS — Z5321 Procedure and treatment not carried out due to patient leaving prior to being seen by health care provider: Secondary | ICD-10-CM | POA: Diagnosis not present

## 2020-02-18 DIAGNOSIS — R0602 Shortness of breath: Secondary | ICD-10-CM | POA: Diagnosis not present

## 2020-02-18 DIAGNOSIS — J9601 Acute respiratory failure with hypoxia: Secondary | ICD-10-CM

## 2020-02-18 DIAGNOSIS — J189 Pneumonia, unspecified organism: Secondary | ICD-10-CM

## 2020-02-18 DIAGNOSIS — F419 Anxiety disorder, unspecified: Secondary | ICD-10-CM

## 2020-02-18 DIAGNOSIS — G8929 Other chronic pain: Secondary | ICD-10-CM

## 2020-02-18 DIAGNOSIS — I10 Essential (primary) hypertension: Secondary | ICD-10-CM

## 2020-02-18 LAB — BASIC METABOLIC PANEL
Anion gap: 12 (ref 5–15)
BUN: 14 mg/dL (ref 6–20)
CO2: 20 mmol/L — ABNORMAL LOW (ref 22–32)
Calcium: 8.5 mg/dL — ABNORMAL LOW (ref 8.9–10.3)
Chloride: 101 mmol/L (ref 98–111)
Creatinine, Ser: 1.12 mg/dL — ABNORMAL HIGH (ref 0.44–1.00)
GFR calc Af Amer: >60 mL/min (ref >60)
GFR calc non Af Amer: 59 mL/min — ABNORMAL LOW (ref >60)
Glucose, Bld: 91 mg/dL (ref 70–99)
Potassium: 3.6 mmol/L (ref 3.5–5.1)
Sodium: 133 mmol/L — ABNORMAL LOW (ref 135–145)

## 2020-02-18 LAB — CBC
HCT: 36.3 % (ref 36.0–46.0)
Hemoglobin: 12 g/dL (ref 12.0–15.0)
MCH: 33.6 pg (ref 26.0–34.0)
MCHC: 33.1 g/dL (ref 30.0–36.0)
MCV: 101.7 fL — ABNORMAL HIGH (ref 80.0–100.0)
Platelets: 162 10*3/uL (ref 150–400)
RBC: 3.57 MIL/uL — ABNORMAL LOW (ref 3.87–5.11)
RDW: 13.5 % (ref 11.5–15.5)
WBC: 23.8 10*3/uL — ABNORMAL HIGH (ref 4.0–10.5)
nRBC: 0 % (ref 0.0–0.2)

## 2020-02-18 NOTE — ED Triage Notes (Addendum)
Pt comes via [facility] for SOB, nausea, reports she vomited in her sleep and thinks she aspirated,  and unable to void.

## 2020-02-18 NOTE — ED Notes (Signed)
PT left due to being is so mch pain and couldn't breathe well

## 2020-02-19 DIAGNOSIS — J9601 Acute respiratory failure with hypoxia: Secondary | ICD-10-CM | POA: Diagnosis not present

## 2020-02-19 DIAGNOSIS — I10 Essential (primary) hypertension: Secondary | ICD-10-CM | POA: Diagnosis not present

## 2020-02-19 DIAGNOSIS — G8929 Other chronic pain: Secondary | ICD-10-CM | POA: Diagnosis not present

## 2020-02-19 DIAGNOSIS — F419 Anxiety disorder, unspecified: Secondary | ICD-10-CM | POA: Diagnosis not present

## 2021-09-01 ENCOUNTER — Emergency Department (HOSPITAL_COMMUNITY)
Admission: EM | Admit: 2021-09-01 | Discharge: 2021-09-01 | Disposition: A | Discharge status: Home / Self Care | Payer: Medicaid Other | Attending: Emergency Medicine | Admitting: Emergency Medicine

## 2021-09-01 ENCOUNTER — Emergency Department (HOSPITAL_COMMUNITY): Payer: Medicaid Other

## 2021-09-01 ENCOUNTER — Other Ambulatory Visit: Payer: Self-pay

## 2021-09-01 DIAGNOSIS — S96911A Strain of unspecified muscle and tendon at ankle and foot level, right foot, initial encounter: Secondary | ICD-10-CM | POA: Insufficient documentation

## 2021-09-01 DIAGNOSIS — S8991XA Unspecified injury of right lower leg, initial encounter: Secondary | ICD-10-CM | POA: Diagnosis present

## 2021-09-01 DIAGNOSIS — F10921 Alcohol use, unspecified with intoxication delirium: Secondary | ICD-10-CM

## 2021-09-01 DIAGNOSIS — F10121 Alcohol abuse with intoxication delirium: Secondary | ICD-10-CM | POA: Diagnosis not present

## 2021-09-01 DIAGNOSIS — X58XXXA Exposure to other specified factors, initial encounter: Secondary | ICD-10-CM | POA: Diagnosis not present

## 2021-09-01 DIAGNOSIS — F1910 Other psychoactive substance abuse, uncomplicated: Secondary | ICD-10-CM

## 2021-09-01 DIAGNOSIS — R7401 Elevation of levels of liver transaminase levels: Secondary | ICD-10-CM | POA: Diagnosis not present

## 2021-09-01 DIAGNOSIS — Z20822 Contact with and (suspected) exposure to covid-19: Secondary | ICD-10-CM | POA: Insufficient documentation

## 2021-09-01 DIAGNOSIS — S8001XA Contusion of right knee, initial encounter: Secondary | ICD-10-CM | POA: Diagnosis not present

## 2021-09-01 LAB — CBC WITH DIFFERENTIAL/PLATELET
Abs Immature Granulocytes: 0.02 10*3/uL (ref 0.00–0.07)
Basophils Absolute: 0.1 10*3/uL (ref 0.0–0.1)
Basophils Relative: 1 %
Eosinophils Absolute: 0.1 10*3/uL (ref 0.0–0.5)
Eosinophils Relative: 1 %
HCT: 42.5 % (ref 36.0–46.0)
Hemoglobin: 13.6 g/dL (ref 12.0–15.0)
Immature Granulocytes: 0 %
Lymphocytes Relative: 26 %
Lymphs Abs: 2 10*3/uL (ref 0.7–4.0)
MCH: 33.7 pg (ref 26.0–34.0)
MCHC: 32 g/dL (ref 30.0–36.0)
MCV: 105.2 fL — ABNORMAL HIGH (ref 80.0–100.0)
Monocytes Absolute: 0.6 10*3/uL (ref 0.1–1.0)
Monocytes Relative: 8 %
Neutro Abs: 5 10*3/uL (ref 1.7–7.7)
Neutrophils Relative %: 64 %
Platelets: 203 10*3/uL (ref 150–400)
RBC: 4.04 MIL/uL (ref 3.87–5.11)
RDW: 14.8 % (ref 11.5–15.5)
WBC: 7.8 10*3/uL (ref 4.0–10.5)
nRBC: 0 % (ref 0.0–0.2)

## 2021-09-01 LAB — COMPREHENSIVE METABOLIC PANEL
ALT: 110 U/L — ABNORMAL HIGH (ref 0–44)
AST: 123 U/L — ABNORMAL HIGH (ref 15–41)
Albumin: 3.4 g/dL — ABNORMAL LOW (ref 3.5–5.0)
Alkaline Phosphatase: 68 U/L (ref 38–126)
Anion gap: 13 (ref 5–15)
BUN: 9 mg/dL (ref 6–20)
CO2: 21 mmol/L — ABNORMAL LOW (ref 22–32)
Calcium: 8.5 mg/dL — ABNORMAL LOW (ref 8.9–10.3)
Chloride: 108 mmol/L (ref 98–111)
Creatinine, Ser: 0.88 mg/dL (ref 0.44–1.00)
GFR, Estimated: >60 mL/min (ref >60)
Glucose, Bld: 99 mg/dL (ref 70–99)
Potassium: 3.6 mmol/L (ref 3.5–5.1)
Sodium: 142 mmol/L (ref 135–145)
Total Bilirubin: 0.5 mg/dL (ref 0.3–1.2)
Total Protein: 7.1 g/dL (ref 6.5–8.1)

## 2021-09-01 LAB — RESP PANEL BY RT-PCR (FLU A&B, COVID) ARPGX2
Influenza A by PCR: NEGATIVE
Influenza B by PCR: NEGATIVE
SARS Coronavirus 2 by RT PCR: NEGATIVE

## 2021-09-01 LAB — RAPID URINE DRUG SCREEN, HOSP PERFORMED
Amphetamines: NOT DETECTED
Barbiturates: NOT DETECTED
Benzodiazepines: NOT DETECTED
Cocaine: POSITIVE — AB
Opiates: NOT DETECTED
Tetrahydrocannabinol: POSITIVE — AB

## 2021-09-01 LAB — LIPASE, BLOOD: Lipase: 63 U/L — ABNORMAL HIGH (ref 11–51)

## 2021-09-01 LAB — ETHANOL: Alcohol, Ethyl (B): 230 mg/dL — ABNORMAL HIGH (ref <10)

## 2021-09-01 MED ORDER — ONDANSETRON HCL 4 MG PO TABS: 4.0000 mg | ORAL_TABLET | Freq: Three times a day (TID) | ORAL | 0 refills | Status: DC | PRN

## 2021-09-01 MED ORDER — TRAZODONE HCL 50 MG PO TABS: 50.0000 mg | ORAL_TABLET | Freq: Every evening | ORAL | 0 refills | Status: DC | PRN

## 2021-09-01 MED ORDER — ONDANSETRON 4 MG PO TBDP
8.0000 mg | ORAL_TABLET | Freq: Once | ORAL | Status: AC
  Administered 2021-09-01: 8 mg via ORAL
  Filled 2021-09-01: qty 2

## 2021-09-01 MED ORDER — IBUPROFEN 800 MG PO TABS
800.0000 mg | ORAL_TABLET | Freq: Once | ORAL | Status: AC
  Administered 2021-09-01: 800 mg via ORAL
  Filled 2021-09-01: qty 1

## 2021-09-01 MED ORDER — GABAPENTIN 400 MG PO CAPS: 800.0000 mg | ORAL_CAPSULE | Freq: Four times a day (QID) | ORAL | 0 refills | Status: DC

## 2021-09-01 MED ORDER — IBUPROFEN 600 MG PO TABS: 600.0000 mg | ORAL_TABLET | Freq: Four times a day (QID) | ORAL | 0 refills | Status: DC | PRN

## 2021-09-01 MED ORDER — VENLAFAXINE HCL ER 150 MG PO CP24: 300.0000 mg | ORAL_CAPSULE | Freq: Every day | ORAL | 0 refills | Status: DC

## 2021-09-01 NOTE — ED Notes (Signed)
Pt ambulated in hallway to restroom. 

## 2021-09-01 NOTE — ED Provider Notes (Signed)
[facility] EMERGENCY DEPARTMENT Provider Note   CSN: [id number] Arrival date & time: 09/01/21  2449     History  Chief Complaint  Patient presents with   Alcohol Intoxication    Judy Robles is a 46 y.o. female.  The history is provided by the patient and medical records. No language interpreter was used.  Alcohol Intoxication   46 year old female [place] train [place] with complaints of vomiting.  Patient was initially found in an elevator at the train station earlier this morning.  At that time she did endorse alcohol consumption and was complaining of nausea and vomiting.  When she was brought here for further assessment.  History is limited initially due to her altered mental state.  Level 5 caveat.  Home Medications Prior to Admission medications   Medication Sig Start Date End Date Taking? Authorizing Provider  gabapentin (NEURONTIN) 400 MG capsule Take 800 mg by mouth 4 (four) times daily.  08/29/19   [provider]  lurasidone (LATUDA) 20 MG TABS tablet Take 1 tablet (20 mg total) by mouth daily with supper. For mood Patient not taking: Reported on 02/04/2020 12/21/18   Connye Burkitt, NP  nicotine (NICODERM CQ - DOSED IN MG/24 HOURS) 21 mg/24hr patch Place 1 patch (21 mg total) onto the skin daily. For smoking cessation Patient not taking: Reported on 02/04/2020 12/22/18   Connye Burkitt, NP  traZODone (DESYREL) 50 MG tablet Take 1 tablet (50 mg total) by mouth at bedtime as needed for sleep. Patient not taking: Reported on 02/04/2020 12/21/18   Connye Burkitt, NP  venlafaxine XR (EFFEXOR-XR) 150 MG 24 hr capsule Take 2 capsules (300 mg total) by mouth daily with breakfast. Patient not taking: Reported on 02/04/2020 12/22/18   Connye Burkitt, NP      Allergies    Tylenol [acetaminophen] and Lisinopril    Review of Systems   Review of Systems  All other systems reviewed and are negative.  Physical Exam Updated Vital Signs BP  138/90 (BP Location: Left Arm)    Pulse 73    Temp 98.1 F (36.7 C)    Resp (!) 22    Ht $R'5\' 3"'fF$  (1.6 m)    Wt 127 kg    SpO2 100%    BMI 49.60 kg/m  Physical Exam Vitals and nursing note reviewed.  Constitutional:      General: She is not in acute distress.    Appearance: She is well-developed.     Comments: Sleeping in a prone position, easily arousable and appears to be in no acute discomfort.  HENT:     Head: Atraumatic.  Eyes:     Conjunctiva/sclera: Conjunctivae normal.  Cardiovascular:     Rate and Rhythm: Normal rate and regular rhythm.     Pulses: Normal pulses.     Heart sounds: Normal heart sounds.  Pulmonary:     Effort: Pulmonary effort is normal.  Abdominal:     Palpations: Abdomen is soft.     Tenderness: There is abdominal tenderness (Mild diffused abdominal tenderness no guarding rebound tenderness).  Musculoskeletal:     Cervical back: Neck supple.  Skin:    Findings: No rash.  Neurological:     GCS: GCS eye subscore is 3. GCS verbal subscore is 4. GCS motor subscore is 6.     Comments: Able to move all 4 extremities  Psychiatric:        Mood and Affect: Mood  normal.    ED Results / Procedures / Treatments   Labs (all labs ordered are listed, but only abnormal results are displayed) Labs Reviewed  COMPREHENSIVE METABOLIC PANEL - Abnormal; Notable for the following components:      Result Value   CO2 21 (*)    Calcium 8.5 (*)    Albumin 3.4 (*)    AST 123 (*)    ALT 110 (*)    All other components within normal limits  CBC WITH DIFFERENTIAL/PLATELET - Abnormal; Notable for the following components:   MCV 105.2 (*)    All other components within normal limits  LIPASE, BLOOD - Abnormal; Notable for the following components:   Lipase 63 (*)    All other components within normal limits  ETHANOL - Abnormal; Notable for the following components:   Alcohol, Ethyl (B) 230 (*)    All other components within normal limits  RAPID URINE DRUG SCREEN, HOSP  PERFORMED - Abnormal; Notable for the following components:   Cocaine POSITIVE (*)    Tetrahydrocannabinol POSITIVE (*)    All other components within normal limits  RESP PANEL BY RT-PCR (FLU A&B, COVID) [id number]    EKG EKG Interpretation  Date/Time:  Wednesday September 01 2021 05:19:52 EST Ventricular Rate:  75 PR Interval:  140 QRS Duration: 80 QT Interval:  418 QTC Calculation: 466 R Axis:   81 Text Interpretation: Sinus rhythm with occasional Premature ventricular complexes Otherwise normal ECG When compared with ECG of 18-Feb-2020 01:40, pvcs are new Confirmed by Dorie Rank [phone]) on 09/01/2021 8:21:59 AM  Radiology DG Chest 2 View  Result Date: 09/01/2021 CLINICAL DATA:  Fall last night. EXAM: CHEST - 2 VIEW COMPARISON:  Chest x-ray dated August 18, 2021. FINDINGS: The heart size and mediastinal contours are within normal limits. Both lungs are clear. The visualized skeletal structures are unremarkable. IMPRESSION: No active cardiopulmonary disease. Electronically Signed   By: Titus Dubin M.D.   On: 09/01/2021 14:24   DG Knee Complete 4 Views Right  Result Date: 09/01/2021 CLINICAL DATA:  Right knee pain after fall last night. EXAM: RIGHT KNEE - COMPLETE 4+ VIEW COMPARISON:  Right knee x-rays dated October 01, 2016. FINDINGS: No acute fracture or dislocation. Moderate joint effusion. Joint spaces are preserved. Probable degenerative subchondral cyst in the inferior patella. Bone mineralization is normal. Soft tissues are unremarkable. IMPRESSION: 1. Moderate joint effusion. No acute osseous abnormality. Electronically Signed   By: Titus Dubin M.D.   On: 09/01/2021 14:21   DG Foot Complete Right  Result Date: 09/01/2021 CLINICAL DATA:  Right foot pain after fall last night. EXAM: RIGHT FOOT COMPLETE - 3+ VIEW COMPARISON:  Right foot x-rays dated April 03, 2019. FINDINGS: No acute fracture or dislocation. Prior fifth metatarsal screw fixation. Unchanged first TMT joint  osteoarthritis. Bone mineralization is normal. Soft tissues are unremarkable. IMPRESSION: No acute osseous abnormality. Electronically Signed   By: Titus Dubin M.D.   On: 09/01/2021 14:23    Procedures Procedures    Medications Ordered in ED Medications  ibuprofen (ADVIL) tablet 800 mg (has no administration in time range)  ondansetron (ZOFRAN-ODT) disintegrating tablet 8 mg (8 mg Oral Given 09/01/21 0515)    ED Course/ Medical Decision Making/ A&P                           Medical Decision Making  BP 138/90 (BP Location: Left Arm)    Pulse 73    Temp  98.1 F (36.7 C)    Resp (!) 22    Ht $R'5\' 3"'zY$  (1.6 m)    Wt 127 kg    SpO2 100%    BMI 49.60 kg/m   8:24 AM This is a 46 year old female significant pertinent medical history including polysubstance abuse including opioid and alcohol abuse, major depressive disorder, chronic pain, bipolar who was brought here via EMS from a train station earlier this morning when she was retrieved from an elevator.  Patient has been department for the past 3 hours.  History is a bit difficult to obtain as patient appears intoxicated.  She is sleeping when I was approaching her room.  She is able to answer question although she prefers to sleep.  Patient admits that she was nauseous and vomiting last night and felt sick.  When asked if she has consume any amount of alcohol she does admits to alcohol use but denies drinking more than usual.  She has admits to marijuana use but denies any other drug use.  She denies SI HI.  She does complain of abdominal discomfort.  She does not complain of any flulike symptoms.  On exam patient is laying prone, sleeping but arousable, appears to be in no acute discomfort.  No significant discomfort to palpation of abdomen.  Labs and EKG obtained and was independently reviewed and interpreted by me.  She has a negative viral respiratory panel.  A comprehensive metabolic panel shows evidence of transaminitis with AST 123, ALT 110  with normal alk phos.  I suspect elevated liver enzyme is secondary to alcohol use.  The value is quite similar to previous liver function labs obtained a year ago.  Lipase is mildly elevated at 63.  Alcohol is 230.  Normal WBC, normal H&H.  Her EKG which was reviewed and independently interpreted by me shows normal sinus rhythm, unchanged from prior.  At this time, plan to have patient rest and continue to metabolize her alcohol and will reassess once she is clinically sober.  I have considered pancreatitis and have also consider advanced imaging but did not order as a suspect her presenting presentation is more consistent with alcohol intoxication.  Her vital sign shows a normal blood pressure, she is afebrile, she is not hypoxic.  1:10 PM Patient is still sleeping.  She has not vomited.  We will have patient ambulate  1:44 PM At this time patient appears clinically sober and able to answering question appropriately.  When ambulates she endorsed having pain to her right knee from her fall from last night.  She does have tenderness to the anterior knee, able to bear weight on her leg.  Will obtain right knee x-ray.  Patient also endorsed pain to her right foot.  X-ray ordered.  On reexamination no abdominal discomfort.  She however complaining of concerns for pneumonia as she has had a recurrent cough for the past few days.  Viral respiratory panel was negative.  Chest x-ray ordered.  Lung exam unremarkable.  2:31 PM X-rays has been viewed and independently interpreted by me.  urine drug screen are positive for cocaine and tetrahydrocannabinol.  X-ray of her right knee demonstrate moderate joint effusion without any acute osseous abnormalities.  X-rays of the right foot is unremarkable, chest x-ray without signs of pneumonia or other acute cardiopulmonary finding.  We will provide knee sleeves to right knee.  Patient also request for refill of her home medication as she has not had access to it for the  past  several months.  I will prescribe some of her medication but recommend patient to follow-up with PCP for further management.        Final Clinical Impression(s) / ED Diagnoses Final diagnoses:  Alcohol intoxication with delirium ([place])  Contusion of right knee, initial encounter  Polysubstance abuse ([place])  Strain of right foot, initial encounter  Transaminitis    Rx / DC Orders ED Discharge Orders          Ordered    ibuprofen (ADVIL) 600 MG tablet  Every 6 hours PRN        09/01/21 1452    ondansetron (ZOFRAN) 4 MG tablet  Every 8 hours PRN        09/01/21 1452    gabapentin (NEURONTIN) 400 MG capsule  4 times daily        09/01/21 1452    traZODone (DESYREL) 50 MG tablet  At bedtime PRN        09/01/21 1452    venlafaxine XR (EFFEXOR-XR) 150 MG 24 hr capsule  Daily with breakfast        09/01/21 1452              Domenic Moras, PA-C 09/01/21 1454    Dorie Rank, MD 09/01/21 [phone]

## 2021-09-01 NOTE — ED Triage Notes (Signed)
Pt [place] [place] [facility] [place] after [employer] retrieved pt from an [employer]. Pt endorses ETOH. Presents with persistent vomiting. C/o SOB, lungs clear, EMS VS SpO2 98%. BP 158/70, HR 87, RR 20, CBG 121.

## 2021-09-01 NOTE — Discharge Instructions (Addendum)
You have been evaluated for your symptoms.  Fortunately your chest x-ray did not show any signs of pneumonia.  X-ray of your right knee did show some swelling which is likely related to an injury.  Fortunately no broken bone noted.  X-ray of your right foot is normal.  Your alcohol level was elevated as well as level of cocaine and marijuana use.  Please refrain from using recreational drugs as it greatly and negatively affect your health.  You may take Zofran as needed for nausea and take ibuprofen as needed for pain.

## 2021-09-01 NOTE — ED Provider Triage Note (Signed)
Emergency Medicine Provider Triage Evaluation Note  Judy Robles , a 46 y.o. female  was evaluated in triage.  Pt complains of nausea and vomiting.  Per EMS, [place]  With the assistance of fire they were able to get her out of the elevator.  Patient states that she was not stuck but was just "sick".  States she has been drinking alcohol and only had "one half of a beer tonight".  Physical Exam  There were no vitals taken for this visit. Gen:   Awake, no distress   Resp:  Normal effort  MSK:   Moves extremities without difficulty  Other:  Actively retching on my exam.  Medical Decision Making  Medically screening exam initiated at 5:11 AM.  Appropriate orders placed.  Judy Fretancy M Haberland was informed that the remainder of the evaluation will be completed by another provider, this initial triage assessment does not replace that evaluation, and the importance of remaining in the ED until their evaluation is complete.   Placido SouJoldersma, Jabe Jeanbaptiste, PA-C 09/01/21 [phone]

## 2022-07-27 DIAGNOSIS — I361 Nonrheumatic tricuspid (valve) insufficiency: Secondary | ICD-10-CM | POA: Diagnosis not present

## 2022-10-20 DIAGNOSIS — G629 Polyneuropathy, unspecified: Secondary | ICD-10-CM | POA: Insufficient documentation

## 2022-10-20 DIAGNOSIS — F39 Unspecified mood [affective] disorder: Secondary | ICD-10-CM | POA: Insufficient documentation

## 2023-06-20 ENCOUNTER — Encounter (HOSPITAL_COMMUNITY): Payer: Self-pay

## 2023-06-20 ENCOUNTER — Emergency Department (HOSPITAL_COMMUNITY): Payer: BLUE CROSS/BLUE SHIELD

## 2023-06-20 ENCOUNTER — Emergency Department (HOSPITAL_COMMUNITY)
Admission: EM | Admit: 2023-06-20 | Discharge: 2023-06-21 | Disposition: A | Discharge status: Other Acute Inpatient Hospital | Payer: BLUE CROSS/BLUE SHIELD | Attending: Emergency Medicine | Admitting: Emergency Medicine

## 2023-06-20 DIAGNOSIS — D72829 Elevated white blood cell count, unspecified: Secondary | ICD-10-CM | POA: Insufficient documentation

## 2023-06-20 DIAGNOSIS — I10 Essential (primary) hypertension: Secondary | ICD-10-CM | POA: Diagnosis not present

## 2023-06-20 DIAGNOSIS — F1110 Opioid abuse, uncomplicated: Secondary | ICD-10-CM | POA: Diagnosis not present

## 2023-06-20 DIAGNOSIS — F1193 Opioid use, unspecified with withdrawal: Secondary | ICD-10-CM | POA: Diagnosis present

## 2023-06-20 LAB — CBC
HCT: 36.5 % (ref 36.0–46.0)
Hemoglobin: 12 g/dL (ref 12.0–15.0)
MCH: 32 pg (ref 26.0–34.0)
MCHC: 32.9 g/dL (ref 30.0–36.0)
MCV: 97.3 fL (ref 80.0–100.0)
Platelets: 254 10*3/uL (ref 150–400)
RBC: 3.75 MIL/uL — ABNORMAL LOW (ref 3.87–5.11)
RDW: 13.2 % (ref 11.5–15.5)
WBC: 12.4 10*3/uL — ABNORMAL HIGH (ref 4.0–10.5)
nRBC: 0 % (ref 0.0–0.2)

## 2023-06-20 LAB — COMPREHENSIVE METABOLIC PANEL
ALT: 43 U/L (ref 0–44)
AST: 63 U/L — ABNORMAL HIGH (ref 15–41)
Albumin: 3.6 g/dL (ref 3.5–5.0)
Alkaline Phosphatase: 67 U/L (ref 38–126)
Anion gap: 11 (ref 5–15)
BUN: 17 mg/dL (ref 6–20)
CO2: 22 mmol/L (ref 22–32)
Calcium: 9 mg/dL (ref 8.9–10.3)
Chloride: 97 mmol/L — ABNORMAL LOW (ref 98–111)
Creatinine, Ser: 1.03 mg/dL — ABNORMAL HIGH (ref 0.44–1.00)
GFR, Estimated: >60 mL/min (ref >60)
Glucose, Bld: 109 mg/dL — ABNORMAL HIGH (ref 70–99)
Potassium: 4.5 mmol/L (ref 3.5–5.1)
Sodium: 130 mmol/L — ABNORMAL LOW (ref 135–145)
Total Bilirubin: 1.6 mg/dL — ABNORMAL HIGH (ref 0.3–1.2)
Total Protein: 8.5 g/dL — ABNORMAL HIGH (ref 6.5–8.1)

## 2023-06-20 LAB — ETHANOL: Alcohol, Ethyl (B): <10 mg/dL (ref <10)

## 2023-06-20 LAB — HCG, SERUM, QUALITATIVE: Preg, Serum: NEGATIVE

## 2023-06-20 MED ORDER — HALOPERIDOL LACTATE 5 MG/ML IJ SOLN
5.0000 mg | Freq: Once | INTRAMUSCULAR | Status: AC
  Administered 2023-06-20: 5 mg via INTRAMUSCULAR
  Filled 2023-06-20: qty 1

## 2023-06-20 MED ORDER — LORAZEPAM 2 MG/ML IJ SOLN
1.0000 mg | Freq: Once | INTRAMUSCULAR | Status: DC
  Filled 2023-06-20: qty 1

## 2023-06-20 MED ORDER — ONDANSETRON HCL 4 MG/2ML IJ SOLN
4.0000 mg | Freq: Once | INTRAMUSCULAR | Status: DC
  Filled 2023-06-20: qty 2

## 2023-06-20 MED ORDER — ONDANSETRON 4 MG PO TBDP
4.0000 mg | ORAL_TABLET | Freq: Once | ORAL | Status: AC
  Administered 2023-06-20: 4 mg via ORAL
  Filled 2023-06-20: qty 1

## 2023-06-20 MED ORDER — LORAZEPAM 2 MG/ML IJ SOLN
2.0000 mg | Freq: Once | INTRAMUSCULAR | Status: AC
  Administered 2023-06-20: 2 mg via INTRAMUSCULAR
  Filled 2023-06-20: qty 1

## 2023-06-20 MED ORDER — ONDANSETRON 4 MG PO TBDP
4.0000 mg | ORAL_TABLET | Freq: Once | ORAL | Status: AC
Start: 2023-06-20 — End: 2023-06-20
  Administered 2023-06-20: 4 mg via ORAL
  Filled 2023-06-20: qty 1

## 2023-06-20 NOTE — ED Provider Notes (Signed)
[place] EMERGENCY DEPARTMENT AT [facility] Provider Note   CSN: [id number] Arrival date & time: 06/20/23  1341     History {Add pertinent medical, surgical, social history, OB history to HPI:1} Chief Complaint  Patient presents with   Withdrawal    Judy Robles is a 47 y.o. female with PMH as listed below who presents BIB [facility] c/o opoid withdrawal, walked to [facility] asking for help. Last use was last night. States she snorts pain pills. Patient is quite agitated on exam, tachypneic, walking around the room, very restless. She is oriented to person, situation, and time, but not place (stated we are in [facility]). She states she is in "pain all over" and that she feels short of breath. States this is similar to withdrawals in the past. She also states that her son died. Her cognition is tangential. She denies EtOH or other illicit drug use. She denies SI/HI/AH/VH.   Past Medical History:  Diagnosis Date   Arthritis    Bipolar 1 disorder (HCC)    Depression    Hypercholesterolemia    Hypertension    MRSA (methicillin resistant Staphylococcus aureus)    Obesity    Renal disease    Sleep apnea        Home Medications Prior to Admission medications   Medication Sig Start Date End Date Taking? Authorizing Provider  gabapentin (NEURONTIN) 400 MG capsule Take 2 capsules (800 mg total) by mouth 4 (four) times daily. 09/01/21   Fayrene Helper, PA-C  ibuprofen (ADVIL) 600 MG tablet Take 1 tablet (600 mg total) by mouth every 6 (six) hours as needed. 09/01/21   Fayrene Helper, PA-C  lurasidone (LATUDA) 20 MG TABS tablet Take 1 tablet (20 mg total) by mouth daily with supper. For mood Patient not taking: Reported on 02/04/2020 12/21/18   Aldean Baker, NP  nicotine (NICODERM CQ - DOSED IN MG/24 HOURS) 21 mg/24hr patch Place 1 patch (21 mg total) onto the skin daily. For smoking cessation Patient not taking: Reported on 02/04/2020 12/22/18   Aldean Baker, NP  ondansetron  (ZOFRAN) 4 MG tablet Take 1 tablet (4 mg total) by mouth every 8 (eight) hours as needed for nausea or vomiting. 09/01/21   Fayrene Helper, PA-C  traZODone (DESYREL) 50 MG tablet Take 1 tablet (50 mg total) by mouth at bedtime as needed for sleep. 09/01/21   Fayrene Helper, PA-C  venlafaxine XR (EFFEXOR-XR) 150 MG 24 hr capsule Take 2 capsules (300 mg total) by mouth daily with breakfast. 09/01/21   Fayrene Helper, PA-C      Allergies    Tylenol [acetaminophen] and Lisinopril    Review of Systems   Review of Systems A 10 point review of systems was performed and is negative unless otherwise reported in HPI.  Physical Exam Updated Vital Signs BP (!) 160/74 (BP Location: Left Arm)   Pulse (!) 104   Temp 98.5 F (36.9 C) (Oral)   Resp 18   SpO2 99%  Physical Exam General: Erratic and disheveled appearing female, walking around the room, sitting in a chair HEENT: PERRLA, EOMI, no nystagmus, Sclera anicteric, MMM, trachea midline. NCAT. No midline C-spine TTP.  Cardiology: RRR, no murmurs/rubs/gallops. BL radial and DP pulses equal bilaterally.  Resp: Normal respiratory effort. Mildly tachypneic but no increased WOB. CTAB, no wheezes, rhonchi, crackles.  Abd: Soft, non-tender, non-distended. No rebound tenderness or guarding.  GU: Deferred. MSK: No peripheral edema or signs of trauma. Extremities without deformity or  TTP. No cyanosis or clubbing. Skin: warm, dry.  Neuro: A&Ox3 (not to place), CNs II-XII grossly intact. MAEs. Sensation grossly intact. Normal ambulation. Psych: Intermittently tangential, erratic behavior. Not oriented to place. Disheveled appearing. Denies SI/HI/AH/VH.   ED Results / Procedures / Treatments   Labs (all labs ordered are listed, but only abnormal results are displayed) Labs Reviewed  COMPREHENSIVE METABOLIC PANEL - Abnormal; Notable for the following components:      Result Value   Sodium 130 (*)    Chloride 97 (*)    Glucose, Bld 109 (*)    Creatinine, Ser 1.03  (*)    Total Protein 8.5 (*)    AST 63 (*)    Total Bilirubin 1.6 (*)    All other components within normal limits  CBC - Abnormal; Notable for the following components:   WBC 12.4 (*)    RBC 3.75 (*)    All other components within normal limits  URINALYSIS, ROUTINE W REFLEX MICROSCOPIC - Abnormal; Notable for the following components:   Hgb urine dipstick SMALL (*)    Ketones, ur 5 (*)    Leukocytes,Ua LARGE (*)    Bacteria, UA MANY (*)    All other components within normal limits  RAPID URINE DRUG SCREEN, HOSP PERFORMED - Abnormal; Notable for the following components:   Cocaine POSITIVE (*)    All other components within normal limits  HCG, SERUM, QUALITATIVE  ETHANOL    EKG EKG Interpretation Date/Time:  Tuesday June 20 2023 17:30:14 EDT Ventricular Rate:  116 PR Interval:  81 QRS Duration:  139 QT Interval:  355 QTC Calculation: 487 R Axis:   94  Text Interpretation: Sinus tachycardia Ventricular premature complex Nonspecific intraventricular conduction delay Nonspecific repol abnormality, inferior leads Baseline wander in lead(s) II III aVF Confirmed by Tilden Fossa [phone]) on 06/21/2023 5:38:16 PM  Radiology No results found.  Procedures Procedures  {Document cardiac monitor, telemetry assessment procedure when appropriate:1}  Medications Ordered in ED Medications  ondansetron (ZOFRAN) injection 4 mg (has no administration in time range)  LORazepam (ATIVAN) injection 1 mg (has no administration in time range)    ED Course/ Medical Decision Making/ A&P                          Medical Decision Making Amount and/or Complexity of Data Reviewed Labs: ordered. Decision-making details documented in ED Course. Radiology: ordered. Decision-making details documented in ED Course.  Risk Prescription drug management.    This patient presents to the ED for concern of acute opiate withdrawal, agitation; this involves an extensive number of treatment  options, and is a complaint that carries with it a high risk of complications and morbidity.  I considered the following differential and admission for this acute, potentially life threatening condition.   MDM:    Pt is agitated  -Intracranial abnormalities such as ICH, hydrocephalus, head trauma -Infection such as UTI, PNA, or meningitis -Toxic ingestion such as opioid overdose, anticholinergic toxicity, -Electrolyte abnormalities or hyper/hypoglycemia -Hypercarbia or hypoxia -Hepatic encephalopathy or uremia -ACS or arrhythmia -Endocrine abnormality such as thyroid storm or myxedema coma *** On initial evaluation, patient constantly pacing throughout the room, rocking, tangential thinking. Consider opiate withdrawal, amphetamine or cocaine intoxication, psychosis, or mania. Patient's first IV blew, and she pulled out the second IV. She is agitated, and offered patient IM medications to help her calm down and she happily agrees. Will give 2 mg IM ativan for agitation and ODT  zofran for nausea, after which we will get IV access.   Clinical Course as of 06/28/23 0958  Tue Jun 20, 2023  1721 WBC(!): 12.4 +leukocytosis.  [HN]  1722 Sodium(!): 130 Mild hyponatremia [HN]  1722 Alcohol, Ethyl (B): <10 [HN]  1730 2 mg IM ativan ineffective. Patient still tangential and erratic. Concern for psychiatric cause as well in addition to opiate withdrawal. Will add 5 mg IM haldol. [HN]  2102 Pulse Rate: 90 [HN]  2102 BP(!): 137/95 Vitals improved with haldol/ativan [HN]  2103 DG Chest Portable 1 View No acute cardiopulmonary disease [HN]  2103 Preg, Serum: NEGATIVE [HN]  2200 D/w patient. She is A&Ox4 now, more goal directed thought process. Her SOB and agitation have improved but she states she does still feel nauseated. Will redose zofran. D/w patient about her disorientation earlier and about staying to be evaluated by psychiatry. She is amenable. She denies any h/o bipolar disorder. States she  took paxel once in the past but does not currently take any psychiatric medications. States she is currently homeless.  [HN]    Clinical Course User Index [HN] Loetta Rough, MD    Labs: I Ordered, and personally interpreted labs.  The pertinent results include:  those listed above  Imaging Studies ordered: I ordered imaging studies including CXR I independently visualized and interpreted imaging. I agree with the radiologist interpretation  Additional history obtained from chart review.   Cardiac Monitoring: The patient was maintained on a cardiac monitor.  I personally viewed and interpreted the cardiac monitored which showed an underlying rhythm of: sinus tachycardia  Reevaluation: After the interventions noted above, I reevaluated the patient and found that they have :improved  Social Determinants of Health: Lives independently  Disposition:  Patient is signed out to the oncoming ED physician who is made aware of her history, presentation, exam, workup, and plan.    Co morbidities that complicate the patient evaluation  Past Medical History:  Diagnosis Date   Arthritis    Bipolar 1 disorder (HCC)    Depression    Hypercholesterolemia    Hypertension    MRSA (methicillin resistant Staphylococcus aureus)    Obesity    Renal disease    Sleep apnea      Medicines Meds ordered this encounter  Medications   ondansetron (ZOFRAN) injection 4 mg   LORazepam (ATIVAN) injection 1 mg    I have reviewed the patients home medicines and have made adjustments as needed  Problem List / ED Course: Problem List Items Addressed This Visit   None Visit Diagnoses     Opiate withdrawal (HCC)    -  Primary            {Document critical care time when appropriate:1} {Document review of labs and clinical decision tools ie heart score, Chads2Vasc2 etc:1}  {Document your independent review of radiology images, and any outside records:1} {Document your discussion with  family members, caretakers, and with consultants:1} {Document social determinants of health affecting pt's care:1} {Document your decision making why or why not admission, treatments were needed:1}  This note was created using dictation software, which may contain spelling or grammatical errors.

## 2023-06-20 NOTE — ED Notes (Signed)
Pt is sitting in doorway in a chair. Pt is very restless.

## 2023-06-20 NOTE — ED Triage Notes (Signed)
BIB [facility] c/o opoid withdrawal, walked to [facility] asking for help. Last use was last night.

## 2023-06-21 ENCOUNTER — Other Ambulatory Visit (INDEPENDENT_AMBULATORY_CARE_PROVIDER_SITE_OTHER)
Admission: EM | Admit: 2023-06-21 | Discharge: 2023-06-26 | Disposition: A | Discharge status: Home / Self Care | Payer: BLUE CROSS/BLUE SHIELD | Source: Home / Self Care | Admitting: Psychiatry

## 2023-06-21 DIAGNOSIS — G8929 Other chronic pain: Secondary | ICD-10-CM | POA: Insufficient documentation

## 2023-06-21 DIAGNOSIS — F1090 Alcohol use, unspecified, uncomplicated: Secondary | ICD-10-CM | POA: Diagnosis present

## 2023-06-21 DIAGNOSIS — E871 Hypo-osmolality and hyponatremia: Secondary | ICD-10-CM | POA: Insufficient documentation

## 2023-06-21 DIAGNOSIS — F1110 Opioid abuse, uncomplicated: Secondary | ICD-10-CM | POA: Diagnosis present

## 2023-06-21 DIAGNOSIS — G4700 Insomnia, unspecified: Secondary | ICD-10-CM | POA: Insufficient documentation

## 2023-06-21 DIAGNOSIS — F1120 Opioid dependence, uncomplicated: Secondary | ICD-10-CM | POA: Insufficient documentation

## 2023-06-21 DIAGNOSIS — F1010 Alcohol abuse, uncomplicated: Secondary | ICD-10-CM | POA: Insufficient documentation

## 2023-06-21 DIAGNOSIS — Z720 Tobacco use: Secondary | ICD-10-CM | POA: Insufficient documentation

## 2023-06-21 DIAGNOSIS — Z79899 Other long term (current) drug therapy: Secondary | ICD-10-CM | POA: Insufficient documentation

## 2023-06-21 DIAGNOSIS — F332 Major depressive disorder, recurrent severe without psychotic features: Secondary | ICD-10-CM | POA: Diagnosis present

## 2023-06-21 DIAGNOSIS — F1320 Sedative, hypnotic or anxiolytic dependence, uncomplicated: Secondary | ICD-10-CM | POA: Diagnosis present

## 2023-06-21 DIAGNOSIS — I10 Essential (primary) hypertension: Secondary | ICD-10-CM | POA: Insufficient documentation

## 2023-06-21 DIAGNOSIS — G2581 Restless legs syndrome: Secondary | ICD-10-CM | POA: Diagnosis present

## 2023-06-21 DIAGNOSIS — F17200 Nicotine dependence, unspecified, uncomplicated: Secondary | ICD-10-CM

## 2023-06-21 DIAGNOSIS — F19982 Other psychoactive substance use, unspecified with psychoactive substance-induced sleep disorder: Secondary | ICD-10-CM | POA: Diagnosis present

## 2023-06-21 LAB — URINALYSIS, ROUTINE W REFLEX MICROSCOPIC
Bilirubin Urine: NEGATIVE
Glucose, UA: NEGATIVE mg/dL
Ketones, ur: 5 mg/dL — AB
Nitrite: NEGATIVE
Protein, ur: NEGATIVE mg/dL
Specific Gravity, Urine: 1.006 (ref 1.005–1.030)
WBC, UA: >50 WBC/hpf (ref 0–5)
pH: 6 (ref 5.0–8.0)

## 2023-06-21 LAB — RAPID URINE DRUG SCREEN, HOSP PERFORMED
Amphetamines: NOT DETECTED
Barbiturates: NOT DETECTED
Benzodiazepines: NOT DETECTED
Cocaine: POSITIVE — AB
Opiates: NOT DETECTED
Tetrahydrocannabinol: NOT DETECTED

## 2023-06-21 MED ORDER — CEFTRIAXONE SODIUM 1 G IJ SOLR
1.0000 g | Freq: Once | INTRAMUSCULAR | Status: AC
  Administered 2023-06-21: 1 g via INTRAMUSCULAR
  Filled 2023-06-21: qty 10

## 2023-06-21 MED ORDER — DIPHENHYDRAMINE HCL 50 MG PO CAPS: 50.0000 mg | ORAL_CAPSULE | Freq: Four times a day (QID) | ORAL | Status: DC | PRN

## 2023-06-21 MED ORDER — CEPHALEXIN 500 MG PO CAPS
500.0000 mg | ORAL_CAPSULE | Freq: Two times a day (BID) | ORAL | Status: DC
Start: 2023-06-21 — End: 2023-06-21
  Administered 2023-06-21 (×2): 500 mg via ORAL
  Filled 2023-06-21 (×2): qty 1

## 2023-06-21 MED ORDER — NAPROXEN 500 MG PO TABS
500.0000 mg | ORAL_TABLET | Freq: Two times a day (BID) | ORAL | Status: DC | PRN
  Administered 2023-06-21: 500 mg via ORAL
  Filled 2023-06-21: qty 1

## 2023-06-21 MED ORDER — METHOCARBAMOL 500 MG PO TABS
500.0000 mg | ORAL_TABLET | Freq: Three times a day (TID) | ORAL | Status: DC | PRN
  Administered 2023-06-21: 500 mg via ORAL
  Filled 2023-06-21: qty 1

## 2023-06-21 MED ORDER — STERILE WATER FOR INJECTION IJ SOLN
INTRAMUSCULAR | Status: AC
  Filled 2023-06-21: qty 10

## 2023-06-21 MED ORDER — CEPHALEXIN 250 MG PO CAPS
500.0000 mg | ORAL_CAPSULE | Freq: Two times a day (BID) | ORAL | Status: AC
  Administered 2023-06-21 – 2023-06-23 (×4): 500 mg via ORAL
  Filled 2023-06-21 (×4): qty 2

## 2023-06-21 MED ORDER — ALUM & MAG HYDROXIDE-SIMETH 200-200-20 MG/5ML PO SUSP
30.0000 mL | Freq: Four times a day (QID) | ORAL | Status: DC | PRN
Start: 2023-06-21 — End: 2023-06-21

## 2023-06-21 MED ORDER — HYDROXYZINE HCL 25 MG PO TABS
25.0000 mg | ORAL_TABLET | Freq: Four times a day (QID) | ORAL | Status: DC | PRN
  Administered 2023-06-21 – 2023-06-22 (×2): 25 mg via ORAL
  Filled 2023-06-21: qty 1

## 2023-06-21 MED ORDER — MAGNESIUM HYDROXIDE 400 MG/5ML PO SUSP: 30.0000 mL | Freq: Every day | ORAL | Status: DC | PRN

## 2023-06-21 MED ORDER — HYDROXYZINE HCL 25 MG PO TABS
25.0000 mg | ORAL_TABLET | Freq: Three times a day (TID) | ORAL | Status: DC | PRN
  Administered 2023-06-22: 25 mg via ORAL
  Filled 2023-06-21: qty 1

## 2023-06-21 MED ORDER — DICYCLOMINE HCL 20 MG PO TABS: 20.0000 mg | ORAL_TABLET | Freq: Four times a day (QID) | ORAL | Status: DC | PRN

## 2023-06-21 MED ORDER — ONDANSETRON 4 MG PO TBDP: 4.0000 mg | ORAL_TABLET | Freq: Four times a day (QID) | ORAL | Status: DC | PRN

## 2023-06-21 MED ORDER — TRAZODONE HCL 100 MG PO TABS
100.0000 mg | ORAL_TABLET | Freq: Every day | ORAL | Status: DC
  Administered 2023-06-21 – 2023-06-23 (×3): 100 mg via ORAL
  Filled 2023-06-21 (×3): qty 1

## 2023-06-21 MED ORDER — CLONIDINE HCL 0.1 MG PO TABS
0.1000 mg | ORAL_TABLET | Freq: Once | ORAL | Status: AC
  Administered 2023-06-21: 0.1 mg via ORAL
  Filled 2023-06-21: qty 1

## 2023-06-21 MED ORDER — ACETAMINOPHEN 325 MG PO TABS: 650.0000 mg | ORAL_TABLET | Freq: Four times a day (QID) | ORAL | Status: DC | PRN

## 2023-06-21 MED ORDER — ALUM & MAG HYDROXIDE-SIMETH 200-200-20 MG/5ML PO SUSP: 30.0000 mL | ORAL | Status: DC | PRN

## 2023-06-21 MED ORDER — LOPERAMIDE HCL 2 MG PO CAPS: 2.0000 mg | ORAL_CAPSULE | ORAL | Status: DC | PRN

## 2023-06-21 NOTE — ED Notes (Signed)
Pt is in his room resting in bed. Pt denies SI/HI/AVH. No acute distress noted. Will continue to monitor for safety.

## 2023-06-21 NOTE — Group Note (Signed)
Group Topic: Communication  Group Date: 06/21/2023 Start Time: 2000 End Time: 2030 Facilitators: Guss Bunde  Department: [facility]  Number of Participants: 3  Group Focus: check in Treatment Modality:  Leisure Development Interventions utilized were group exercise Purpose: reinforce self-care  Name: Judy Robles Date of Birth: 01-Oct-1975  MR: [id number]    Level of Participation: Did not attend group Quality of Participation:  Interactions with others:  Mood/Affect:  Triggers (if applicable):  Cognition:  Progress:  Response:  Plan:   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   MDD (major depressive disorder), severe (HCC) 12/18/2018   HCAP (healthcare-associated pneumonia) 09/16/2018   LLL pneumonia 09/16/2018   Sepsis (HCC) 09/16/2018   Hyponatremia 09/16/2018   Hypokalemia 09/16/2018   Severe benzodiazepine use disorder (HCC) 07/10/2018   Bipolar II disorder (HCC)    Severe recurrent major depression without psychotic features (HCC) 06/05/2018   PTSD (post-traumatic stress disorder) 11/21/2017   Major depressive disorder, recurrent episode, moderate (HCC) 11/05/2017   Community acquired pneumonia of right lower lobe of lung 09/02/2017   Sepsis associated hypotension (HCC) 09/02/2017   AKI (acute kidney injury) (HCC) 09/02/2017   Nausea and vomiting 09/02/2017   Drug overdose 04/22/2017   Chest pain 10/09/2016   Pressure injury of skin 10/05/2016   Polysubstance dependence including opioid type drug with complication, episodic abuse (HCC) 07/21/2016   MDD (major depressive disorder), recurrent severe, without psychosis (HCC) 07/21/2016   Chronic pain 07/03/2015   Diarrhea 05/01/2014   Primary hypertension  04/17/2014

## 2023-06-21 NOTE — Progress Notes (Signed)
Pt was transferred from [facility] and admitted to [facility] due to Opiate abuse. Pt is alert and oriented X3. Pt is ambulatory and is oriented to staff/unit. Pt is fidgety and reported that it is due to restless leg syndrome. Pt was cooperative with admission process and skin assessment. Bruises were noted on pt's right lateral elbow and right feet. Ecchymosis was also noted on pt's bilateral antecubital.  Pt complained of pain due to restless legs. Pt denies current SI/HI/AVH, plan or intent. 15 minutes safety checks initiated per order. Staff will monitor for pt's safety.

## 2023-06-21 NOTE — Consult Note (Cosign Needed Addendum)
[facility] ED ASSESSMENT   Reason for Consult:  Psych Consult Referring Physician:  April Palumbo Patient Identification: Judy Robles MRN:  [id number] ED Chief Complaint: Opiate abuse, continuous (HCC)  Diagnosis:  Principal Problem:   Opiate abuse, continuous (HCC)   ED Assessment Time Calculation: Start Time: 0800 Stop Time: 0900 Total Time in Minutes (Assessment Completion): 60  HPI:  Judy Robles is a 47 y.o. female patient brought in by [facility] after walking into a Station 1 complaining of opioid withdrawal. Patient has a history of Bipolar 2, MDD, PTSD, polysubstance abuse, COPD and HTN.  Subjective:   Judy Robles is a 47 y.o. female patient brought in by [facility] after walking into a Station 1 complaining of opioid withdrawal. Patient has a history of Bipolar 2, MDD, PTSD, polysubstance abuse, COPD and HTN.  Judy Robles, 47 y.o., female patient seen face to face by this provider, consulted with Dr. Lucianne Muss; and chart reviewed on 06/21/23.  On evaluation Judy Robles reports she needs help detoxing from opiates.  Patient is very fidgety.  She says was previously on suboxone and it was very helpful.  She stopped using suboxone because she did not have consistent transportation to the provider.  She lives in [place] and could not find a provider or transportation.  [place].  Patient wants help to stop using substances.      Collateral obtained from Mom, Judy Robles. She states patient does stay in [place].  Patient does not have an address of her own.  Mom says patient hasn't been able to "get straight ever since the patient's son was shot and killed 4 years ago in [place].    During evaluation Judy Robles is laying in bed in mild distress.  She is alert, oriented x 4, calm, cooperative and attentive. Her mood is depressed with congruent affect.  She has normal speech, and behavior.  Objectively there is no evidence of  psychosis/mania or delusional thinking.  Patient is able to converse coherently, goal directed thoughts, no distractibility, or pre-occupation.  She also denies suicidal/self-harm/homicidal ideation, psychosis, and paranoia.  Patient answered questions appropriately.    Patient is abusing opioids and wants help to stop using the substances.  She meets criteria for inpatient detoxification and treatment.    Past Psychiatric History: Bipolar 2, MDD, PTSD, polysubstance abuse  Risk to Self or Others: Is the patient at risk to self? No Has the patient been a risk to self in the past 6 months? No Has the patient been a risk to self within the distant past? No Is the patient a risk to others? No Has the patient been a risk to others in the past 6 months? No Has the patient been a risk to others within the distant past? No  [place] Scale:  Flowsheet Row ED from 06/20/2023 in [facility] Emergency Department at [facility] ED from 09/01/2021 in [facility] Emergency Department at [facility] ED from 10/23/2018 in [facility] Emergency Department at [facility]  C-SSRS RISK CATEGORY No Risk No Risk High Risk       Substance Abuse:   Opiates, cocaine, hx of benzodiazepines and alcohol abuse  Past Medical History:  Past Medical History:  Diagnosis Date   Arthritis    Bipolar 1 disorder (HCC)    Depression    Hypercholesterolemia    Hypertension    MRSA (methicillin resistant  Staphylococcus aureus)    Obesity    Renal disease    Sleep apnea     Past Surgical History:  Procedure Laterality Date   CESAREAN SECTION     FRACTURE SURGERY Right    ankle   I & D EXTREMITY Right 04/16/2014   Procedure: IRRIGATION AND DEBRIDEMENT EXTREMITY;  Surgeon: Sharma Covert, MD;  Location: [facility] OR;  Service: Orthopedics;  Laterality: Right;   Family History:  Family History  Problem Relation Age of Onset   Throat cancer Mother    Hypertension Father    Stroke Father     Family Psychiatric  History: None noted Social History:  Social History   Substance and Sexual Activity  Alcohol Use Yes   Comment: rare, occasional     Social History   Substance and Sexual Activity  Drug Use Yes   Types: Cocaine   Comment: occasional cocaine use    Social History   Socioeconomic History   Marital status: Single    Spouse name: Not on file   Number of children: Not on file   Years of education: Not on file   Highest education level: Not on file  Occupational History   Not on file  Tobacco Use   Smoking status: Every Day    Current packs/day: 1.00    Types: Cigarettes   Smokeless tobacco: Never   Tobacco comments:    1/2PPD  Vaping Use   Vaping status: Never Used  Substance and Sexual Activity   Alcohol use: Yes    Comment: rare, occasional   Drug use: Yes    Types: Cocaine    Comment: occasional cocaine use   Sexual activity: Not Currently    Birth control/protection: Surgical  Other Topics Concern   Not on file  Social History Narrative   Not on file   Social Determinants of Health   Financial Resource Strain: Not on file  Food Insecurity: Not on file  Transportation Needs: Not on file  Physical Activity: Not on file  Stress: Not on file  Social Connections: Unknown (10/19/2022)   Received from [facility]   Social Connections    Frequency of Communication with Friends and Family: Not asked    Frequency of Social Gatherings with Friends and Family: Not asked   Additional Social History: Patient lives with older woman acquaintance    Allergies:   Allergies  Allergen Reactions   Tylenol [Acetaminophen] Itching and Other (See Comments)    Welts   Lisinopril Other (See Comments)    Shuts down kidneys    Labs:  Results for orders placed or performed during the hospital encounter of 06/20/23 (from the past 48 hour(s))  Comprehensive metabolic panel     Status: Abnormal   Collection Time: 06/20/23  4:17 PM  Result Value Ref  Range   Sodium 130 (L) 135 - 145 mmol/L   Potassium 4.5 3.5 - 5.1 mmol/L    Comment: HEMOLYSIS AT THIS LEVEL MAY AFFECT RESULT   Chloride 97 (L) 98 - 111 mmol/L   CO2 22 22 - 32 mmol/L   Glucose, Bld 109 (H) 70 - 99 mg/dL    Comment: Glucose reference range applies only to samples taken after fasting for at least 8 hours.   BUN 17 6 - 20 mg/dL   Creatinine, Ser 1.61 (H) 0.44 - 1.00 mg/dL   Calcium 9.0 8.9 - 09.6 mg/dL   Total Protein 8.5 (H) 6.5 - 8.1 g/dL   Albumin 3.6 3.5 -  5.0 g/dL   AST 63 (H) 15 - 41 U/L    Comment: HEMOLYSIS AT THIS LEVEL MAY AFFECT RESULT   ALT 43 0 - 44 U/L    Comment: HEMOLYSIS AT THIS LEVEL MAY AFFECT RESULT   Alkaline Phosphatase 67 38 - 126 U/L   Total Bilirubin 1.6 (H) 0.3 - 1.2 mg/dL    Comment: HEMOLYSIS AT THIS LEVEL MAY AFFECT RESULT   GFR, Estimated >60 >60 mL/min    Comment: (NOTE) Calculated using the CKD-EPI Creatinine Equation (2021)    Anion gap 11 5 - 15    Comment: Performed at [facility], [address] [place], [place] [address]  CBC     Status: Abnormal   Collection Time: 06/20/23  4:17 PM  Result Value Ref Range   WBC 12.4 (H) 4.0 - 10.5 K/uL   RBC 3.75 (L) 3.87 - 5.11 MIL/uL   Hemoglobin 12.0 12.0 - 15.0 g/dL   HCT 19.1 47.8 - 29.5 %   MCV 97.3 80.0 - 100.0 fL   MCH 32.0 26.0 - 34.0 pg   MCHC 32.9 30.0 - 36.0 g/dL   RDW 62.1 30.8 - 65.7 %   Platelets 254 150 - 400 K/uL   nRBC 0.0 0.0 - 0.2 %    Comment: Performed at [facility], [address] [place], [place] [address]  hCG, serum, qualitative     Status: None   Collection Time: 06/20/23  4:17 PM  Result Value Ref Range   Preg, Serum NEGATIVE NEGATIVE    Comment:        THE SENSITIVITY OF THIS METHODOLOGY IS >10 mIU/mL. Performed at [facility], [address], [place], [place] [address]   Ethanol     Status: None   Collection Time: 06/20/23  4:17 PM  Result Value Ref Range   Alcohol, Ethyl (B) <10 <10  mg/dL    Comment: (NOTE) Lowest detectable limit for serum alcohol is 10 mg/dL.  For medical purposes only. Performed at [facility], [address] [place], [place] [address]   Urinalysis, Routine w reflex microscopic -Urine, Clean Catch     Status: Abnormal   Collection Time: 06/21/23  2:23 AM  Result Value Ref Range   Color, Urine YELLOW YELLOW   APPearance CLEAR CLEAR   Specific Gravity, Urine 1.006 1.005 - 1.030   pH 6.0 5.0 - 8.0   Glucose, UA NEGATIVE NEGATIVE mg/dL   Hgb urine dipstick SMALL (A) NEGATIVE   Bilirubin Urine NEGATIVE NEGATIVE   Ketones, ur 5 (A) NEGATIVE mg/dL   Protein, ur NEGATIVE NEGATIVE mg/dL   Nitrite NEGATIVE NEGATIVE   Leukocytes,Ua LARGE (A) NEGATIVE   RBC / HPF 6-10 0 - 5 RBC/hpf   WBC, UA >50 0 - 5 WBC/hpf   Bacteria, UA MANY (A) NONE SEEN   Squamous Epithelial / HPF 0-5 0 - 5 /HPF   Hyaline Casts, UA PRESENT     Comment: Performed at [facility], [address] [place] [address], [place], [place] [address]  Rapid urine drug screen (hospital performed)     Status: Abnormal   Collection Time: 06/21/23  2:23 AM  Result Value Ref Range   Opiates NONE DETECTED NONE DETECTED   Cocaine POSITIVE (A) NONE DETECTED   Benzodiazepines NONE DETECTED NONE DETECTED   Amphetamines NONE DETECTED NONE DETECTED   Tetrahydrocannabinol NONE DETECTED NONE DETECTED   Barbiturates NONE DETECTED NONE DETECTED    Comment: (NOTE) DRUG SCREEN  FOR MEDICAL PURPOSES ONLY.  IF CONFIRMATION IS NEEDED FOR ANY PURPOSE, NOTIFY LAB WITHIN 5 DAYS.  LOWEST DETECTABLE LIMITS FOR URINE DRUG SCREEN Drug Class                     Cutoff (ng/mL) Amphetamine and metabolites    1000 Barbiturate and metabolites    200 Benzodiazepine                 200 Opiates and metabolites        300 Cocaine and metabolites        300 THC                            50 Performed at [facility], [address], [place], [place] [address]      Current Facility-Administered Medications  Medication Dose Route Frequency Provider Last Rate Last Admin   alum & mag hydroxide-simeth (MAALOX/MYLANTA) 200-200-20 MG/5ML suspension 30 mL  30 mL Oral Q6H PRN Palumbo, April, MD       cephALEXin [facility]) capsule 500 mg  500 mg Oral Q12H Palumbo, April, MD   500 mg at 06/21/23 1038   Current Outpatient Medications  Medication Sig Dispense Refill   gabapentin (NEURONTIN) 400 MG capsule Take 2 capsules (800 mg total) by mouth 4 (four) times daily. (Patient not taking: Reported on 06/20/2023) 30 capsule 0   ibuprofen (ADVIL) 600 MG tablet Take 1 tablet (600 mg total) by mouth every 6 (six) hours as needed. (Patient not taking: Reported on 06/20/2023) 30 tablet 0   lurasidone (LATUDA) 20 MG TABS tablet Take 1 tablet (20 mg total) by mouth daily with supper. For mood (Patient not taking: Reported on 02/04/2020) 30 tablet 0   nicotine (NICODERM CQ - DOSED IN MG/24 HOURS) 21 mg/24hr patch Place 1 patch (21 mg total) onto the skin daily. For smoking cessation (Patient not taking: Reported on 02/04/2020) 28 patch 0   ondansetron (ZOFRAN) 4 MG tablet Take 1 tablet (4 mg total) by mouth every 8 (eight) hours as needed for nausea or vomiting. (Patient not taking: Reported on 06/20/2023) 12 tablet 0   traZODone (DESYREL) 50 MG tablet Take 1 tablet (50 mg total) by mouth at bedtime as needed for sleep. (Patient not taking: Reported on 06/20/2023) 30 tablet 0   venlafaxine XR (EFFEXOR-XR) 150 MG 24 hr capsule Take 2 capsules (300 mg total) by mouth daily with breakfast. (Patient not taking: Reported on 06/20/2023) 60 capsule 0    Musculoskeletal: Strength & Muscle Tone: within normal limits Gait & Station: normal Patient leans: N/A   Psychiatric Specialty Exam: Presentation  General Appearance:  Appropriate for Environment  Eye Contact: Fleeting  Speech: Clear and Coherent  Speech Volume: Normal  Handedness: Right   Mood and Affect   Mood: Depressed  Affect: Congruent   Thought Process  Thought Processes: Coherent  Descriptions of Associations:Intact  Orientation:Full (Time, Place and Person)  Thought Content:WDL  History of Schizophrenia/Schizoaffective disorder:No data recorded Duration of Psychotic Symptoms:No data recorded Hallucinations:Hallucinations: None  Ideas of Reference:None  Suicidal Thoughts:Suicidal Thoughts: No  Homicidal Thoughts:Homicidal Thoughts: No   Sensorium  Memory: Immediate Fair; Recent Fair; Remote Fair  Judgment: Impaired  Insight: Fair   Chartered certified accountant: Fair  Attention Span: Fair  Recall: [employer] of Knowledge: Fair  Language: Good   Psychomotor Activity  Psychomotor Activity: Psychomotor Activity: Normal   Assets  Assets:  Desire for Improvement; Leisure Time    Sleep  Sleep: Sleep: Fair Number of Hours of Sleep: 6   Physical Exam: Physical Exam Vitals and nursing note reviewed.  Constitutional:      Appearance: She is obese.  Eyes:     Pupils: Pupils are equal, round, and reactive to light.  Pulmonary:     Effort: Pulmonary effort is normal.  Skin:    General: Skin is dry.  Neurological:     Mental Status: She is alert and oriented to person, place, and time.    Review of Systems  Psychiatric/Behavioral:  Positive for substance abuse.   All other systems reviewed and are negative.  Blood pressure 106/83, pulse 91, temperature 98.9 F (37.2 C), temperature source Oral, resp. rate 16, SpO2 98%. There is no height or weight on file to calculate BMI.  Medical Decision Making: Patient case reviewed and discussed with Dr Lucianne Muss. Patient is abusing opioids and wants help to stop using the substances.  She meets criteria for inpatient detoxification and treatment.    Problem 1: Opiate abuse - Inpatient treatment   Disposition:  Recommend inpatient detoxification and treatment.    Thomes Lolling,  NP 06/21/2023 12:06 PM

## 2023-06-21 NOTE — ED Provider Notes (Signed)
Emergency Medicine Observation Re-evaluation Note  Judy Robles is a 47 y.o. female, seen on rounds today.  Pt initially presented to the ED for complaints of Withdrawal Currently, the patient is resting, right lateral decubitus position.  Physical Exam  BP (!) 154/131 (BP Location: Left Arm)   Pulse 86   Temp 98.7 F (37.1 C) (Oral)   Resp 16   SpO2 97%  Physical Exam General: No distress Cardiac: Regular rate and rhythm Lungs: No increased work of breathing ED Course / MDM  EKG:   I have reviewed the labs performed to date as well as medications administered while in observation.  Recent changes in the last 24 hours include drug screen positive for cocaine.  Plan  Current plan is for assistance from behavioral health.    Gerhard Munch, MD 06/21/23 [phone]

## 2023-06-21 NOTE — BH Assessment (Signed)
TTS attempted to see pt. TTS was informed in secure chat by  Melind,EMT that she was unable to find TTS telecart. Pt will be seen by provider during day shift.

## 2023-06-21 NOTE — ED Provider Notes (Signed)
Emergency Medicine Observation Re-evaluation Note  Judy Robles is a 47 y.o. female, seen on rounds today.  Pt initially presented to the ED for complaints of Withdrawal Currently, the patient is awake and alert.  Pt abuses drugs and wants help.  She has been assessed by psych and [facility]. Physical Exam  BP (!) 157/85 (BP Location: Left Arm)   Pulse 78   Temp 98.9 F (37.2 C) (Oral)   Resp 16   SpO2 100%  Physical Exam General: awake and alert Cardiac: rr Lungs: clear Psych: calm  ED Course / MDM  EKG:   I have reviewed the labs performed to date as well as medications administered while in observation.  Recent changes in the last 24 hours include acceptance to [facility].  Plan  Current plan is for d/c from ED.  We will have Safe Transport take to the [facility].    Jacalyn Lefevre, MD 06/21/23 [phone]

## 2023-06-21 NOTE — ED Notes (Signed)
Patient is sleeping. Respirations equal and unlabored, skin warm and dry. No change in assessment or acuity. Routine safety checks conducted according to facility protocol. Will continue to monitor for safety.   

## 2023-06-21 NOTE — ED Notes (Signed)
Patient provided dinner.

## 2023-06-22 ENCOUNTER — Encounter (HOSPITAL_COMMUNITY): Payer: Self-pay | Admitting: Psychiatry

## 2023-06-22 ENCOUNTER — Encounter (HOSPITAL_COMMUNITY): Payer: Self-pay

## 2023-06-22 DIAGNOSIS — F17200 Nicotine dependence, unspecified, uncomplicated: Secondary | ICD-10-CM | POA: Diagnosis not present

## 2023-06-22 DIAGNOSIS — F1120 Opioid dependence, uncomplicated: Secondary | ICD-10-CM | POA: Diagnosis not present

## 2023-06-22 DIAGNOSIS — F1090 Alcohol use, unspecified, uncomplicated: Secondary | ICD-10-CM

## 2023-06-22 MED ORDER — LORAZEPAM 1 MG PO TABS
1.0000 mg | ORAL_TABLET | Freq: Four times a day (QID) | ORAL | Status: DC | PRN
Start: 2023-06-22 — End: 2023-06-23

## 2023-06-22 MED ORDER — NICOTINE 21 MG/24HR TD PT24
21.0000 mg | MEDICATED_PATCH | Freq: Every day | TRANSDERMAL | Status: DC | PRN
  Administered 2023-06-24 – 2023-06-25 (×2): 21 mg via TRANSDERMAL
  Filled 2023-06-22 (×3): qty 1

## 2023-06-22 MED ORDER — ADULT MULTIVITAMIN W/MINERALS CH
1.0000 | ORAL_TABLET | Freq: Every day | ORAL | Status: DC
  Administered 2023-06-22 – 2023-06-25 (×4): 1 via ORAL
  Filled 2023-06-22 (×4): qty 1

## 2023-06-22 MED ORDER — GABAPENTIN 100 MG PO CAPS
100.0000 mg | ORAL_CAPSULE | Freq: Three times a day (TID) | ORAL | Status: DC
  Administered 2023-06-22 – 2023-06-24 (×7): 100 mg via ORAL
  Filled 2023-06-22 (×7): qty 1

## 2023-06-22 MED ORDER — THIAMINE MONONITRATE 100 MG PO TABS
100.0000 mg | ORAL_TABLET | Freq: Every day | ORAL | Status: DC
  Administered 2023-06-23 – 2023-06-26 (×4): 100 mg via ORAL
  Filled 2023-06-22 (×4): qty 1

## 2023-06-22 MED ORDER — THIAMINE HCL 100 MG/ML IJ SOLN
100.0000 mg | Freq: Once | INTRAMUSCULAR | Status: AC
  Administered 2023-06-22: 100 mg via INTRAMUSCULAR
  Filled 2023-06-22: qty 2

## 2023-06-22 NOTE — ED Notes (Signed)
Pt here voluntarily for opioid detox. Pt denies SI, HI, AVH and pain. Endorses restless legs, for which she takes gabapentin. Pt is pleasant and cooperative. "I've been sleeping all day." Pt denies withdrawal symptoms today besides anxiety. Anxiety is rated as 6/10 and depression 5/10. Pt denies any cravings for cocaine. "That was a one-time thing. I don't need that in my life." Pt says her appetite and sleep are okay. COWS=1.

## 2023-06-22 NOTE — ED Notes (Signed)
Pt resting at this hour. No apparent distress. RR even and unlabored. Monitored for safety.  

## 2023-06-22 NOTE — ED Notes (Signed)
Patient is sleeping. Respirations equal and unlabored, skin warm and dry. No change in assessment or acuity. Routine safety checks conducted according to facility protocol. Will continue to monitor for safety.   

## 2023-06-22 NOTE — ED Provider Notes (Addendum)
Facility Based Crisis Admission H&P  Date: 06/22/23 Patient Name: Judy [facility] MRN: [id number] Chief Complaint: "here to get help"  Diagnoses:  Final diagnoses:  Opioid use disorder, severe, dependence (HCC)  Tobacco use disorder  Alcohol use disorder   Reason for Admission: [facility]. [name] is a 47 yo female with a previous history of MDD, PTSD, bipolar II disorder,  alcohol use disorder, tobacco use disorder, and opioid use disorder, with prior psychiatric hospitalizations (last at [facility] in 2020). She initially presented to [facility] requesting detox from opioids on 10/29, and was admitted to [facility] on 10/30.  UDS is only positive for cocaine.  EtOH levels were also <10.  HPI:  The patient reports that she was picked up by a friend in [place] and driven to [place].  Per the patient, this friend left to go to an appointment and left the patient alone wandering in [place].  She reports an incident in which she collapsed while walking in [place] and presented herself to a fire house reporting worsening withdrawal symptoms.  She reports a longstanding history of opioid abuse and alcohol abuse, citing the death of her son 5 years ago as an ongoing source of grief and pain.  She is open to exploring outpatient grief counseling for this.  The patient initially denies cravings but acknowledges recent opioid use last taken approximately 3-4 days prior, and describes cocaine use as a one-time deal prior to admission.  Despite reporting opioid use 3-4 days ago and alcohol use on the same timeline, her urine drug screen only returned positive for cocaine.  She is reporting withdrawal symptoms of sweating, tremors, and fatigue but denies any current cravings for substances.  She reports previously having success on Suboxone.  Socially, the patient describes living in [place] with an elderly woman, while at other points she had stated she had been living with a friend in [place].  Her employment history is also  unclear, initially reports being unemployed and later reports during treatment team that she is working at a [employer].  When the patient was initially evaluated this morning she described motivations for sobriety centered around a new romantic relationship with her partner who encouraged her to seek treatment.  She has been in this relationship for the past 3 months.  Patient expressed interest in treatment for depression and anxiety, request benzodiazepines for anxiety relief, although she acknowledges that they conflict with goals of sobriety.  She agrees to trial as needed Atarax and we also agreed to start her on gabapentin for reported neuropathic/chronic pain.   Substance Use History: Opioids: Patient is currently abusing opioids and reports needing help detoxing. Previously on Suboxone, which she found helpful, but stopped due to lack of transportation to her provider. Last use prior to admission: using for the past 4 years. Uses percocets, purchased form a Consulting civil engineer. This started when pt had surgery.  Denies heroin or fentanyl.  Onset of use: Route: PO use. Frequency/Amount of Use: Uses daily, 5-6 tablets of 10 mg pills.  Hx of withdrawals: insomnia, restlessness, agitated, GI disturbances Periods of sobriety: 1-1.5 years of sobriety due to a "change of atmosphere" Alcohol: last used 2 days ago.  Onset of use: has been drinking heavily int he past year Frequency/Amount of Use: daily, drinks about 6 pack of beer Hx of withdrawals: denies seizures, denies DTs Periods of sobriety: Tobacco: smokes daily, 1 ppd for the past 10 years Cannabis: Denies Cocaine: UDS positive for cocaine, patient reports "one-time use" Methamphetamines: Denies  Benzodiazepines: reports hx of use, but uses rarely. Reports no recent use.  IV Drug Use History: Denies Rehabilitation History:  Detoxed in [facility] previously [facility]  Past Psychiatric History: Current Psychiatrist: No Current Therapist:  No Previous Psychiatric Diagnoses: Bipolar 2, Major Depressive Disorder (MDD), Post-Traumatic Stress Disorder (PTSD), polysubstance abuse. Current Psychiatric Medications: Hx of noncompliance. Previously on zoloft, paroxetine, sertraline, venlafaxine Past Hospitalizations: Previously hospitalized in April 2020 at [facility]  Past Medical History: Primary Care Provider: None Medical Diagnoses: Chronic Obstructive Pulmonary Disease (COPD), Hypertension (HTN). Allergies: Tylenol and Lisinopril Trauma: Patient's son was shot and killed 4 years ago in [place]. Seizures: No  Family History: Unknown to pt   Social History: Living Situation: Per patient she is staying with a friend in [place]. She recently started a relationship, has a female partner who is supportive. Denies any abuse.  Social Support: Family lives in [place]. 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 Systems developer History: No   PHQ 2-9:  Flowsheet Row ED from 06/21/2023 in [facility]  Thoughts that you would be better off dead, or of hurting yourself in some way More than half the days  PHQ-9 Total Score 22       Flowsheet Row ED from 06/21/2023 in [facility] from [facility]/20[facility] from 09/01/2021 in [facility]  C-SSRS RISK CATEGORY No Risk No Risk No Risk        Screenings    Flowsheet Row Most Recent Value  COWS Total Score 1       Total Time spent with patient: 1.5 hours  Musculoskeletal  Strength & Muscle Tone: within normal limits Gait & Station: normal Patient leans: N/A  Psychiatric Specialty Exam  Presentation General Appearance:  Disheveled  Eye Contact: Fair  Speech: Clear and Coherent; Normal Rate  Speech Volume: Normal  Handedness: -- (Not assessed)   Mood and Affect  Mood: -- ("I feel rough")  Affect: Congruent; Full Range   Thought Process  Thought Processes: Linear  Descriptions of Associations:Intact  Orientation:-- (Grossly intact)  Thought Content:Logical    Hallucinations:Hallucinations: None  Ideas of Reference:None  Suicidal Thoughts:Suicidal Thoughts: No  Homicidal Thoughts:Homicidal Thoughts: No   Sensorium  Memory: Immediate Good; Recent Good; Remote Good  Judgment: Fair  Insight: Fair   Chartered certified accountant: Fair  Attention Span: Fair  Recall: Poor  Fund of Knowledge: Fair  Language: Fair   Psychomotor Activity  Psychomotor Activity: Psychomotor Activity: Decreased   Assets  Assets: Desire for Improvement; Resilience; Communication Skills   Sleep  Sleep: Sleep: Fair Number of Hours of Sleep: 6   Nutritional Assessment (For OBS and FBC admissions only) Has the patient had a weight loss or gain of 10 pounds or more in the last 3 months?: No Has the patient had a decrease in food intake/or appetite?: No Does the patient have dental problems?: No  Does the patient have eating habits or behaviors that may be indicators of an eating disorder including binging or inducing vomiting?: No Has the patient recently lost weight without trying?: 0 Has the patient been eating poorly because of a decreased appetite?: 0 Malnutrition Screening Tool Score: 0    Physical Exam Vitals and nursing note reviewed.  Constitutional:      General:  She is not in acute distress. HENT:     Head: Normocephalic and atraumatic.  Pulmonary:     Effort: Pulmonary effort is normal. No respiratory distress.  Musculoskeletal:        General: Normal range of motion.  Skin:    General: Skin is warm and dry.  Neurological:     General: No focal deficit present.     Mental Status: She is lethargic.    Review of Systems  All other systems reviewed and are negative.   Blood pressure 131/68, pulse 70, temperature 98.6 F (37 C), temperature source Oral, resp. rate 18, SpO2 99%. There is no height or weight on file to calculate BMI.  Last Labs:  Admission on 06/20/2023, Discharged on 06/21/2023  Component Date Value Ref Range Status   Sodium 06/20/2023 130 (L)  135 - 145 mmol/L Final   Potassium 06/20/2023 4.5  3.5 - 5.1 mmol/L Final   HEMOLYSIS AT THIS LEVEL MAY AFFECT RESULT   Chloride 06/20/2023 97 (L)  98 - 111 mmol/L Final   CO2 06/20/2023 22  22 - 32 mmol/L Final   Glucose, Bld 06/20/2023 109 (H)  70 - 99 mg/dL Final   Glucose reference range applies only to samples taken after fasting for at least 8 hours.   BUN 06/20/2023 17  6 - 20 mg/dL Final   Creatinine, Ser 06/20/2023 1.03 (H)  0.44 - 1.00 mg/dL Final   Calcium 09/81/1914 9.0  8.9 - 10.3 mg/dL Final   Total Protein 78/29/5621 8.5 (H)  6.5 - 8.1 g/dL Final   Albumin 30/86/5784 3.6  3.5 - 5.0 g/dL Final   AST 69/62/9528 63 (H)  15 - 41 U/L Final   HEMOLYSIS AT THIS LEVEL MAY AFFECT RESULT   ALT 06/20/2023 43  0 - 44 U/L Final   HEMOLYSIS AT THIS LEVEL MAY AFFECT RESULT   Alkaline Phosphatase 06/20/2023 67  38 - 126 U/L Final   Total Bilirubin 06/20/2023 1.6 (H)  0.3 - 1.2 mg/dL Final   HEMOLYSIS AT THIS LEVEL MAY AFFECT RESULT   GFR, Estimated 06/20/2023 >60  >60 mL/min Final   Comment: (NOTE) Calculated using the CKD-EPI Creatinine Equation (2021)    Anion gap 06/20/2023 11  5 - 15 Final   Performed at [facility], [address], [place],  [place] [address]   WBC 06/20/2023 12.4 (H)  4.0 - 10.5 K/uL Final   RBC 06/20/2023 3.75 (L)  3.87 - 5.11 MIL/uL Final   Hemoglobin 06/20/2023 12.0  12.0 - 15.0 g/dL Final   HCT 40/05/2724 36.5  36.0 - 46.0 % Final   MCV 06/20/2023 97.3  80.0 - 100.0 fL Final   MCH 06/20/2023 32.0  26.0 - 34.0 pg Final   MCHC 06/20/2023 32.9  30.0 - 36.0 g/dL Final   RDW 36/64/4034 13.2  11.5 - 15.5 % Final   Platelets 06/20/2023 254  150 - 400 K/uL Final   nRBC 06/20/2023 0.0  0.0 - 0.2 % Final   Performed at [facility], [address], [place], [place] [address]  Color, Urine 06/21/2023 YELLOW  YELLOW Final   APPearance 06/21/2023 CLEAR  CLEAR Final   Specific Gravity, Urine 06/21/2023 1.006  1.005 - 1.030 Final   pH 06/21/2023 6.0  5.0 - 8.0 Final   Glucose, UA 06/21/2023 NEGATIVE  NEGATIVE mg/dL Final   Hgb urine dipstick 06/21/2023 SMALL (A)  NEGATIVE Final   Bilirubin Urine 06/21/2023 NEGATIVE  NEGATIVE Final   Ketones, ur 06/21/2023 5 (A)  NEGATIVE mg/dL Final   Protein, ur 42/59/5638 NEGATIVE  NEGATIVE mg/dL Final   Nitrite 75/64/3329 NEGATIVE  NEGATIVE Final   Leukocytes,Ua 06/21/2023 LARGE (A)  NEGATIVE Final   RBC / HPF 06/21/2023 6-10  0 - 5 RBC/hpf Final   WBC, UA 06/21/2023 >50  0 - 5 WBC/hpf Final   Bacteria, UA 06/21/2023 MANY (A)  NONE SEEN Final   Squamous Epithelial / HPF 06/21/2023 0-5  0 - 5 /HPF Final   Hyaline Casts, UA 06/21/2023 PRESENT   Final   Performed at [facility], [address] [place], [place] [address]   Preg, Serum 06/20/2023 NEGATIVE  NEGATIVE Final   Comment:        THE SENSITIVITY OF THIS METHODOLOGY IS >10 mIU/mL. Performed at [facility], [address], [place], [place] [address]    Alcohol, Ethyl (B) 06/20/2023 <10  <10 mg/dL Final   Comment: (NOTE) Lowest detectable limit for serum alcohol is 10 mg/dL.  For medical purposes only. Performed at [facility], [address], [place], [place] [address]    Opiates 06/21/2023 NONE DETECTED  NONE DETECTED Final   Cocaine 06/21/2023 POSITIVE (A)  NONE DETECTED Final   Benzodiazepines 06/21/2023 NONE DETECTED  NONE DETECTED Final   Amphetamines 06/21/2023 NONE DETECTED  NONE DETECTED Final   Tetrahydrocannabinol 06/21/2023 NONE DETECTED  NONE DETECTED Final   Barbiturates 06/21/2023 NONE DETECTED  NONE DETECTED Final   Comment: (NOTE) DRUG SCREEN FOR MEDICAL PURPOSES ONLY.  IF CONFIRMATION IS NEEDED FOR ANY PURPOSE, NOTIFY LAB WITHIN 5 DAYS.  LOWEST DETECTABLE LIMITS FOR URINE DRUG SCREEN Drug Class                     Cutoff (ng/mL) Amphetamine and metabolites    1000 Barbiturate and metabolites    200 Benzodiazepine                 200 Opiates and metabolites        300 Cocaine and metabolites        300 THC                            50 Performed at [facility], [address] [place], [place] [address]     Allergies: Tylenol [acetaminophen] and Lisinopril  Medications:  Facility Ordered Medications  Medication   [COMPLETED] cefTRIAXone (ROCEPHIN) injection 1 g   [COMPLETED] sterile water (preservative free) injection   cephALEXin (KEFLEX) capsule 500 mg   alum & mag hydroxide-simeth (MAALOX/MYLANTA) 200-200-20 MG/5ML suspension 30 mL   magnesium hydroxide (MILK OF MAGNESIA) suspension 30 mL   diphenhydrAMINE (BENADRYL) capsule 50 mg   hydrOXYzine (ATARAX) tablet 25 mg   traZODone (DESYREL) tablet 100 mg   dicyclomine (BENTYL) tablet 20 mg   hydrOXYzine (ATARAX) tablet 25 mg   loperamide (IMODIUM) capsule 2-4 mg   methocarbamol (ROBAXIN) tablet 500 mg   naproxen (NAPROSYN) tablet 500 mg   ondansetron (ZOFRAN-ODT) disintegrating tablet  4 mg   [COMPLETED] cloNIDine (CATAPRES) tablet 0.1 mg   [COMPLETED] thiamine (VITAMIN B1) injection 100 mg   [START ON 06/23/2023] thiamine (VITAMIN B1) tablet 100 mg   multivitamin with minerals tablet 1 tablet   LORazepam (ATIVAN)  tablet 1 mg   gabapentin (NEURONTIN) capsule 100 mg   nicotine (NICODERM CQ - dosed in mg/24 hours) patch 21 mg   PTA Medications  Medication Sig   lurasidone (LATUDA) 20 MG TABS tablet Take 1 tablet (20 mg total) by mouth daily with supper. For mood (Patient not taking: Reported on 02/04/2020)   nicotine (NICODERM CQ - DOSED IN MG/24 HOURS) 21 mg/24hr patch Place 1 patch (21 mg total) onto the skin daily. For smoking cessation (Patient not taking: Reported on 02/04/2020)   ibuprofen (ADVIL) 600 MG tablet Take 1 tablet (600 mg total) by mouth every 6 (six) hours as needed. (Patient not taking: Reported on 06/20/2023)   ondansetron (ZOFRAN) 4 MG tablet Take 1 tablet (4 mg total) by mouth every 8 (eight) hours as needed for nausea or vomiting. (Patient not taking: Reported on 06/20/2023)   gabapentin (NEURONTIN) 400 MG capsule Take 2 capsules (800 mg total) by mouth 4 (four) times daily. (Patient not taking: Reported on 06/20/2023)   traZODone (DESYREL) 50 MG tablet Take 1 tablet (50 mg total) by mouth at bedtime as needed for sleep. (Patient not taking: Reported on 06/20/2023)   venlafaxine XR (EFFEXOR-XR) 150 MG 24 hr capsule Take 2 capsules (300 mg total) by mouth daily with breakfast. (Patient not taking: Reported on 06/20/2023)    Long Term Goals: Improvement in symptoms so as ready for discharge  Short Term Goals: Patient will verbalize feelings in meetings with treatment team members., Patient will attend at least of 50% of the groups daily., Pt will complete the PHQ9 on admission, day 3 and discharge., Patient will participate in completing the [place] Suicide Severity Rating Scale, Patient will score a low risk of violence for 24 hours prior to discharge, and Patient will take medications as prescribed daily.  Medical Decision Making   Psychiatric Diagnoses: Opioid use disorder, severe, dependence Tobacco use Disorder  Alcohol Use Disorder    Psychiatric Diagnoses and Treatment:   Opioid use disorder -COWS  -Last COWS score is 1 on 06/22/2023 6:42 AM -Tylenol 650 mg every 6 hours as needed for mild pain -Naproxen 500 mg BID as needed for pain -Bentyl 20 mg every 6 hours as needed for spasms/abdominal cramping -Robaxin 500 mg every 8 hours as needed for muscle spasms -Zofran 4 mg every 6 hours as needed for nausea or vomiting -Imodium 2 to 4 mg as needed for diarrhea or loose stools -Maalox/Mylanta 30 mL every 4 hours as needed for indigestion -Milk of Mag 30 mL as needed for constipation    Alcohol use disorder Benzodiazepine use CIWA scoring with Ativan as needed for CIWA score >10 Thiamine supplementation  Tobacco use disorder Smoking cessation encouraged Nicotine patch daily as needed  Insomnia Trazodone 100 mg  Medical Issues Being Addressed:   Neuropathic pain Gabapentin 100 mg 3 times daily  CMP showing hyponatremia, some mild hyperglycemia 109, elevated creatinine 1.03, elevated AST 63, and elevated total bilirubin 1.6  --- repeat CMP ordered for tomorrow.  Anticipate labs will improve with improved fluid intake.  Other Labs/Imaging Reviewed: CBC at [facility] showing leukocytosis WBC 12.4, otherwise unremarkable Urine pregnancy is negative UA showing ketones and many bacteria   EKG on 10/31/204: QTc 487, Frederica corrected QTc  is 442 for HR 116   Disposition: Tentative referrals to outpatient services in [place].    Recommendations  Based on my evaluation the patient does not appear to have an emergency medical condition.  Lorri Frederick, MD 06/22/23  11:41 AM

## 2023-06-22 NOTE — Tx Team (Signed)
LCSW, MD, and Resident met with patient to assess current mood, affect, physical state, and inquire about needs/goals while here in [facility] and after discharge. [facility] is a 47 year old female who presented requesting detox from opioids use. Pt has a hx of MDD, PTSD, Bipolar II, alcohol use disorder, tobacco use disorder, and opioid use disorder, with prior psychiatric hospitalizations (last at [facility] in 2020).   Pt reports that her drug of choice are Percocet's which she takes "5-6 10mg  pills a day", however this is inconsistent with her toxicology. Report which shows negative for opiates. Pt also shared that she drinks about a 6 pack of beer a day and cites her alcohol use as her "biggest problem" as it goes hand and hand with her depressive symptoms; this was also inconsistent with her toxicology report which did not show any alcohol in her system. Pt did test positive for cocaine, which she shared was a new drug she used while visiting a friend in town.  Pt reports that she does not have much of a support system, and that her family does not have much to do with her. She cites her son's murder in January of 2020 as the start of her decline and addiction. Pt also shared she has another 28 year old son who is currently in prison. Pt reported with inconsistencies in her reported story across providers, raising concerns about the reliability of self-reported information. Pt informed this Child psychotherapist that she resides in [place] and was in [place] visiting a friend. Pt also reported full time work at [employer].   Pt expressed no interest in residential treatment at this time. She voiced interest in outpatient services, including grief counseling and medication management. Pt will be provided information on outpatient resources and grief counseling services within the [facility]. Patient currently denies any SI/HI/AVH and reports mood as "tired". Patient aware that LCSW will send a referral out to  [facility] for outpatient services, and LCSW will follow up with updates as received. Patient expressed understanding and appreciation of LCSW assistance. No other needs were reported at this time by patient.    Fernande Boyden, LCSW Clinical Social Worker [facility] Ph: [phone]

## 2023-06-22 NOTE — Group Note (Signed)
Group Topic: Recovery Basics  Group Date: 06/22/2023 Start Time: 2000 End Time: 2100 Facilitators: Quinn Axe, NT  Department: [facility]  Number of Participants: 4  Group Focus: check in, clarity of thought, communication, coping skills, and substance abuse education Treatment Modality:  Cognitive Behavioral Therapy and Spiritual Interventions utilized were clarification, confrontation, and story telling Purpose: enhance coping skills, express feelings, and increase insight  Name: [facility] Date of Birth: [birth year]  MR: [id number]    Level of Participation: Patient did not attend meeting Quality of Participation: N/A Interactions with others: N/A Mood/Affect: N/A Triggers (if applicable): N/A Cognition: N/A Progress: Other Response: N/A Plan: patient will be encouraged to attend meetings and activities on the unit.  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   MDD (major depressive disorder), severe (HCC) 12/18/2018   HCAP (healthcare-associated pneumonia) 09/16/2018   LLL pneumonia 09/16/2018   Sepsis (HCC) 09/16/2018   Hyponatremia 09/16/2018   Hypokalemia 09/16/2018   Severe benzodiazepine use disorder (HCC) 07/10/2018   Bipolar II disorder (HCC)    Severe recurrent major depression without psychotic features (HCC) 06/05/2018   PTSD (post-traumatic stress disorder) 11/21/2017   Major depressive disorder, recurrent episode, moderate (HCC) 11/05/2017   Community acquired pneumonia of right lower lobe of lung 09/02/2017   Sepsis associated hypotension (HCC) 09/02/2017   AKI (acute kidney injury) (HCC) 09/02/2017   Nausea and vomiting 09/02/2017   Drug overdose 04/22/2017   Chest pain 10/09/2016   Pressure  injury of skin 10/05/2016   Polysubstance dependence including opioid type drug with complication, episodic abuse (HCC) 07/21/2016   MDD (major depressive disorder), recurrent severe, without psychosis (HCC) 07/21/2016   Chronic pain 07/03/2015   Diarrhea 05/01/2014   Primary hypertension 04/17/2014

## 2023-06-22 NOTE — ED Notes (Signed)
Patient remains resting in bed without issue or complaint.  She accepted morning medication.  She refused breakfast.  Will monitor.

## 2023-06-22 NOTE — Group Note (Signed)
Group Topic: Balance in Life ( Nutritional Group) Group Date: 06/22/2023 Start Time: 1155 End Time: 1219 Facilitators: Vonzell Schlatter B  Department: [facility]  Number of Participants: 3  Group Focus: daily focus Treatment Modality:  Psychoeducation Interventions utilized were group exercise and support Purpose: enhance coping skills and increase insight  Name: [facility] Date of Birth: [birth year]  MR: [id number]    Level of Participation: moderate Quality of Participation: attentive and cooperative Interactions with others: gave feedback Mood/Affect: positive Triggers (if applicable): n/a Cognition: coherent/clear Progress: Moderate Response: n/a Plan: follow-up needed  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   MDD (major depressive disorder), severe (HCC) 12/18/2018   HCAP (healthcare-associated pneumonia) 09/16/2018   LLL pneumonia 09/16/2018   Sepsis (HCC) 09/16/2018   Hyponatremia 09/16/2018   Hypokalemia 09/16/2018   Severe benzodiazepine use disorder (HCC) 07/10/2018   Bipolar II disorder (HCC)    Severe recurrent major depression without psychotic features (HCC) 06/05/2018   PTSD (post-traumatic stress disorder) 11/21/2017   Major depressive disorder, recurrent episode, moderate (HCC) 11/05/2017   Community acquired pneumonia of right lower lobe of lung 09/02/2017   Sepsis associated hypotension (HCC) 09/02/2017   AKI (acute kidney injury) (HCC) 09/02/2017   Nausea and vomiting 09/02/2017   Drug overdose 04/22/2017   Chest pain 10/09/2016   Pressure injury of skin 10/05/2016   Polysubstance dependence including opioid type drug with complication, episodic abuse (HCC) 07/21/2016   MDD (major depressive  disorder), recurrent severe, without psychosis (HCC) 07/21/2016   Chronic pain 07/03/2015   Diarrhea 05/01/2014   Primary hypertension 04/17/2014

## 2023-06-22 NOTE — ED Notes (Signed)
Pt asleep at this hour. No apparent distress. RR even and unlabored. Monitored for safety.  

## 2023-06-22 NOTE — Group Note (Signed)
Group Topic: Recovery Basics  Group Date: 06/22/2023 Start Time: 1240 End Time: 1308 Facilitators: Jenean Lindau, RN  Department: [facility]  Number of Participants: 4  Group Focus: chemical dependency issues Treatment Modality:  Cognitive Behavioral Therapy Interventions utilized were exploration and patient education Purpose: explore maladaptive thinking, express feelings, express irrational fears, improve communication skills, increase insight, regain self-worth, and reinforce self-care  Name: KENDALE KOKOSKA Date of Birth: 03-23-1976  MR: [id number]    Level of Participation: active Quality of Participation: attentive Interactions with others: gave feedback Mood/Affect: appropriate Triggers (if applicable):   Cognition: coherent/clear Progress: Moderate Response:   Plan: follow-up needed  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   MDD (major depressive disorder), severe (HCC) 12/18/2018   HCAP (healthcare-associated pneumonia) 09/16/2018   LLL pneumonia 09/16/2018   Sepsis (HCC) 09/16/2018   Hyponatremia 09/16/2018   Hypokalemia 09/16/2018   Severe benzodiazepine use disorder (HCC) 07/10/2018   Bipolar II disorder (HCC)    Severe recurrent major depression without psychotic features (HCC) 06/05/2018   PTSD (post-traumatic stress disorder) 11/21/2017   Major depressive disorder, recurrent episode, moderate (HCC) 11/05/2017   Community acquired pneumonia of right lower lobe of lung 09/02/2017   Sepsis associated hypotension (HCC) 09/02/2017   AKI (acute kidney injury) (HCC) 09/02/2017   Nausea and vomiting 09/02/2017   Drug overdose 04/22/2017   Chest pain 10/09/2016   Pressure injury of skin 10/05/2016    Polysubstance dependence including opioid type drug with complication, episodic abuse (HCC) 07/21/2016   MDD (major depressive disorder), recurrent severe, without psychosis (HCC) 07/21/2016   Chronic pain 07/03/2015   Diarrhea 05/01/2014   Primary hypertension 04/17/2014

## 2023-06-22 NOTE — BH IP Treatment Plan (Signed)
Interdisciplinary Treatment and Diagnostic Plan Update  06/22/2023 Time of Session: 11:00 am Judy Robles MRN: [id number]  Diagnosis:  Final diagnoses:  Opioid use disorder, severe, dependence (HCC)  Tobacco use disorder  Alcohol use disorder     Current Medications:  Current Facility-Administered Medications  Medication Dose Route Frequency Provider Last Rate Last Admin   alum & mag hydroxide-simeth (MAALOX/MYLANTA) 200-200-20 MG/5ML suspension 30 mL  30 mL Oral Q4H PRN Weber, Kyra A, NP       cephALEXin (KEFLEX) capsule 500 mg  500 mg Oral Q12H Weber, Kyra A, NP   500 mg at 06/22/23 0930   dicyclomine (BENTYL) tablet 20 mg  20 mg Oral Q6H PRN Rayburn Go, Veronique M, NP       diphenhydrAMINE (BENADRYL) capsule 50 mg  50 mg Oral Q6H PRN Weber, Kyra A, NP       gabapentin (NEURONTIN) capsule 100 mg  100 mg Oral TID Lorri Frederick, MD   100 mg at 06/22/23 1141   hydrOXYzine (ATARAX) tablet 25 mg  25 mg Oral TID PRN Phebe Colla A, NP   25 mg at 06/22/23 0935   hydrOXYzine (ATARAX) tablet 25 mg  25 mg Oral Q6H PRN Marlou Sa, NP   25 mg at 06/21/23 2121   loperamide (IMODIUM) capsule 2-4 mg  2-4 mg Oral PRN Marlou Sa, NP       LORazepam (ATIVAN) tablet 1 mg  1 mg Oral Q6H PRN Carrion-Carrero, Margely, MD       magnesium hydroxide (MILK OF MAGNESIA) suspension 30 mL  30 mL Oral Daily PRN Weber, Bella Kennedy A, NP       methocarbamol (ROBAXIN) tablet 500 mg  500 mg Oral Q8H PRN Rayburn Go, Veronique M, NP   500 mg at 06/21/23 1751   multivitamin with minerals tablet 1 tablet  1 tablet Oral Daily Carrion-Carrero, Margely, MD   1 tablet at 06/22/23 0930   naproxen (NAPROSYN) tablet 500 mg  500 mg Oral BID PRN Marlou Sa, NP   500 mg at 06/21/23 1751   nicotine (NICODERM CQ - dosed in mg/24 hours) patch 21 mg  21 mg Transdermal Daily PRN Carrion-Carrero, Karle Starch, MD       ondansetron (ZOFRAN-ODT) disintegrating tablet 4 mg  4 mg Oral Q6H PRN Rayburn Go, Veronique  M, NP       [START ON 06/23/2023] thiamine (VITAMIN B1) tablet 100 mg  100 mg Oral Daily Carrion-Carrero, Margely, MD       traZODone (DESYREL) tablet 100 mg  100 mg Oral QHS Weber, Kyra A, NP   100 mg at 06/21/23 2120   Current Outpatient Medications  Medication Sig Dispense Refill   gabapentin (NEURONTIN) 400 MG capsule Take 2 capsules (800 mg total) by mouth 4 (four) times daily. (Patient not taking: Reported on 06/20/2023) 30 capsule 0   ibuprofen (ADVIL) 600 MG tablet Take 1 tablet (600 mg total) by mouth every 6 (six) hours as needed. (Patient not taking: Reported on 06/20/2023) 30 tablet 0   lurasidone (LATUDA) 20 MG TABS tablet Take 1 tablet (20 mg total) by mouth daily with supper. For mood (Patient not taking: Reported on 02/04/2020) 30 tablet 0   nicotine (NICODERM CQ - DOSED IN MG/24 HOURS) 21 mg/24hr patch Place 1 patch (21 mg total) onto the skin daily. For smoking cessation (Patient not taking: Reported on 02/04/2020) 28 patch 0   ondansetron (ZOFRAN) 4 MG tablet Take 1 tablet (4 mg total) by mouth every 8 (eight)  hours as needed for nausea or vomiting. (Patient not taking: Reported on 06/20/2023) 12 tablet 0   traZODone (DESYREL) 50 MG tablet Take 1 tablet (50 mg total) by mouth at bedtime as needed for sleep. (Patient not taking: Reported on 06/20/2023) 30 tablet 0   venlafaxine XR (EFFEXOR-XR) 150 MG 24 hr capsule Take 2 capsules (300 mg total) by mouth daily with breakfast. (Patient not taking: Reported on 06/20/2023) 60 capsule 0   PTA Medications: Prior to Admission medications   Medication Sig Start Date End Date Taking? Authorizing Provider  gabapentin (NEURONTIN) 400 MG capsule Take 2 capsules (800 mg total) by mouth 4 (four) times daily. Patient not taking: Reported on 06/20/2023 09/01/21   Fayrene Helper, PA-C  ibuprofen (ADVIL) 600 MG tablet Take 1 tablet (600 mg total) by mouth every 6 (six) hours as needed. Patient not taking: Reported on 06/20/2023 09/01/21   Fayrene Helper,  PA-C  lurasidone (LATUDA) 20 MG TABS tablet Take 1 tablet (20 mg total) by mouth daily with supper. For mood Patient not taking: Reported on 02/04/2020 12/21/18   Aldean Baker, NP  nicotine (NICODERM CQ - DOSED IN MG/24 HOURS) 21 mg/24hr patch Place 1 patch (21 mg total) onto the skin daily. For smoking cessation Patient not taking: Reported on 02/04/2020 12/22/18   Aldean Baker, NP  ondansetron (ZOFRAN) 4 MG tablet Take 1 tablet (4 mg total) by mouth every 8 (eight) hours as needed for nausea or vomiting. Patient not taking: Reported on 06/20/2023 09/01/21   Fayrene Helper, PA-C  traZODone (DESYREL) 50 MG tablet Take 1 tablet (50 mg total) by mouth at bedtime as needed for sleep. Patient not taking: Reported on 06/20/2023 09/01/21   Fayrene Helper, PA-C  venlafaxine XR (EFFEXOR-XR) 150 MG 24 hr capsule Take 2 capsules (300 mg total) by mouth daily with breakfast. Patient not taking: Reported on 06/20/2023 09/01/21   Fayrene Helper, PA-C    Patient Stressors: Financial difficulties   Loss of son in Jan 2020   Substance abuse    Patient Strengths: Manufacturing systems engineer  Work skills   Treatment Modalities: Medication Management, Group therapy, Case management,  1 to 1 session with clinician, Psychoeducation, Recreational therapy.   Physician Treatment Plan for Primary and Secondary Diagnosis:  Final diagnoses:  Opioid use disorder, severe, dependence (HCC)  Tobacco use disorder  Alcohol use disorder   Long Term Goal(s): Improvement in symptoms so as ready for discharge  Short Term Goals: Patient will verbalize feelings in meetings with treatment team members. Patient will attend at least of 50% of the groups daily. Pt will complete the PHQ9 on admission, day 3 and discharge. Patient will participate in completing the [place] Suicide Severity Rating Scale Patient will score a low risk of violence for 24 hours prior to discharge Patient will take medications as prescribed daily.  Medication  Management: Evaluate patient's response, side effects, and tolerance of medication regimen.  Therapeutic Interventions: 1 to 1 sessions, Unit Group sessions and Medication administration.  Evaluation of Outcomes: Progressing  LCSW Treatment Plan for Primary Diagnosis:  Final diagnoses:  Opioid use disorder, severe, dependence (HCC)  Tobacco use disorder  Alcohol use disorder    Long Term Goal(s): Safe transition to appropriate next level of care at discharge.  Short Term Goals: Facilitate acceptance of mental health diagnosis and concerns through verbal commitment to aftercare plan and appointments at discharge., Patient will identify one social support prior to discharge to aid in patient's recovery., Patient will attend AA/NA  groups as scheduled., Identify minimum of 2 triggers associated with mental health/substance abuse issues with treatment team members., and Increase skills for wellness and recovery by attending 50% of scheduled groups.  Therapeutic Interventions: Assess for all discharge needs, 1 to 1 time with Child psychotherapist, Explore available resources and support systems, Assess for adequacy in community support network, Educate family and significant other(s) on suicide prevention, Complete Psychosocial Assessment, Interpersonal group therapy.  Evaluation of Outcomes: Progressing   Progress in Treatment: Attending groups: Yes. Participating in groups: Yes. Taking medication as prescribed: Yes. Toleration medication: Yes. Family/Significant other contact made: No, will contact:  Pt does not want anyone contacted Patient understands diagnosis: Yes. Discussing patient identified problems/goals with staff: Yes. Medical problems stabilized or resolved: Yes. Denies suicidal/homicidal ideation: Yes. Issues/concerns per patient self-inventory: No.   New problem(s) identified: No, Describe:  No additional problems identified outside of the problems shared in initial  assessment  New Short Term/Long Term Goal(s):Safe transition to appropriate next level of care at discharge, Engage patient in therapeutic group addressing interpersonal concerns. Engage patient in aftercare planning with referrals and resources, Increase ability to appropriately verbalize feelings, Facilitate acceptance of mental health diagnosis and concerns and Identify triggers associated with mental health/substance abuse issues.   Discharge Plan or Barriers: Pt will receive outpatient follow-up for substance use and grief counseling.   Reason for Continuation of Hospitalization: Medication stabilization Withdrawal symptoms           Estimated Length of Stay:  Last 3 [place] Suicide Severity Risk Score: Flowsheet Row ED from 06/21/2023 in [facility] ED from 06/20/2023 in [facility] Department at [facility] ED from 09/01/2021 in [facility] Emergency Department at [facility]  C-SSRS RISK CATEGORY No Risk No Risk No Risk       Last [facility] 2/9 Scores:    06/22/2023   11:08 AM 05/01/2014    2:05 PM  Depression screen PHQ 2/9  Decreased Interest 2 0  Down, Depressed, Hopeless 3 0  PHQ - 2 Score 5 0  Altered sleeping 2   Tired, decreased energy 3   Change in appetite 2   Feeling bad or failure about yourself  3   Trouble concentrating 2   Moving slowly or fidgety/restless 3   Suicidal thoughts 2   PHQ-9 Score 22   Difficult doing work/chores Very difficult     Scribe for Treatment Team: Loleta Dicker, LCSW 06/22/2023 12:59 PM

## 2023-06-23 DIAGNOSIS — F1120 Opioid dependence, uncomplicated: Secondary | ICD-10-CM | POA: Diagnosis not present

## 2023-06-23 MED ORDER — HYDROXYZINE HCL 25 MG PO TABS
25.0000 mg | ORAL_TABLET | Freq: Three times a day (TID) | ORAL | Status: DC | PRN
  Administered 2023-06-23 – 2023-06-26 (×7): 25 mg via ORAL
  Filled 2023-06-23 (×6): qty 1

## 2023-06-23 MED ORDER — LORAZEPAM 1 MG PO TABS: 1.0000 mg | ORAL_TABLET | Freq: Four times a day (QID) | ORAL | Status: DC | PRN

## 2023-06-23 MED ORDER — BACLOFEN 10 MG PO TABS: 5.0000 mg | ORAL_TABLET | Freq: Three times a day (TID) | ORAL | Status: DC | PRN

## 2023-06-23 MED ORDER — LOPERAMIDE HCL 2 MG PO CAPS: 2.0000 mg | ORAL_CAPSULE | ORAL | Status: AC | PRN

## 2023-06-23 MED ORDER — BISMUTH SUBSALICYLATE 262 MG PO CHEW
524.0000 mg | CHEWABLE_TABLET | ORAL | Status: DC | PRN
  Administered 2023-06-24 – 2023-06-25 (×2): 524 mg via ORAL
  Filled 2023-06-23 (×2): qty 2

## 2023-06-23 MED ORDER — CLONIDINE HCL 0.1 MG PO TABS
0.1000 mg | ORAL_TABLET | ORAL | Status: DC | PRN
  Administered 2023-06-25: 0.1 mg via ORAL
  Filled 2023-06-23 (×2): qty 1

## 2023-06-23 MED ORDER — DIPHENHYDRAMINE HCL 25 MG PO CAPS: 25.0000 mg | ORAL_CAPSULE | Freq: Four times a day (QID) | ORAL | Status: DC | PRN

## 2023-06-23 MED ORDER — SENNA 8.6 MG PO TABS: 1.0000 | ORAL_TABLET | Freq: Every evening | ORAL | Status: DC | PRN

## 2023-06-23 MED ORDER — HALOPERIDOL LACTATE 5 MG/ML IJ SOLN: 5.0000 mg | Freq: Four times a day (QID) | INTRAMUSCULAR | Status: DC | PRN

## 2023-06-23 MED ORDER — LORAZEPAM 2 MG/ML IJ SOLN: 1.0000 mg | Freq: Four times a day (QID) | INTRAMUSCULAR | Status: DC | PRN

## 2023-06-23 MED ORDER — ALUM & MAG HYDROXIDE-SIMETH 200-200-20 MG/5ML PO SUSP: 30.0000 mL | ORAL | Status: DC | PRN

## 2023-06-23 MED ORDER — POLYETHYLENE GLYCOL 3350 17 G PO PACK: 17.0000 g | PACK | Freq: Every day | ORAL | Status: DC | PRN

## 2023-06-23 MED ORDER — ONDANSETRON HCL 4 MG PO TABS
8.0000 mg | ORAL_TABLET | Freq: Three times a day (TID) | ORAL | Status: DC | PRN
  Administered 2023-06-24 – 2023-06-25 (×2): 8 mg via ORAL
  Filled 2023-06-23 (×2): qty 2

## 2023-06-23 MED ORDER — HALOPERIDOL 5 MG PO TABS: 5.0000 mg | ORAL_TABLET | Freq: Four times a day (QID) | ORAL | Status: DC | PRN

## 2023-06-23 MED ORDER — DICYCLOMINE HCL 20 MG PO TABS
20.0000 mg | ORAL_TABLET | Freq: Four times a day (QID) | ORAL | Status: DC | PRN
  Administered 2023-06-25 (×2): 20 mg via ORAL
  Filled 2023-06-23 (×2): qty 1

## 2023-06-23 MED ORDER — NICOTINE POLACRILEX 2 MG MT GUM: 2.0000 mg | CHEWING_GUM | OROMUCOSAL | Status: DC | PRN

## 2023-06-23 MED ORDER — NAPROXEN 500 MG PO TABS
500.0000 mg | ORAL_TABLET | Freq: Two times a day (BID) | ORAL | Status: DC | PRN
  Administered 2023-06-23 – 2023-06-25 (×4): 500 mg via ORAL
  Filled 2023-06-23 (×4): qty 1

## 2023-06-23 MED ORDER — DIPHENHYDRAMINE HCL 50 MG/ML IJ SOLN: 25.0000 mg | Freq: Four times a day (QID) | INTRAMUSCULAR | Status: DC | PRN

## 2023-06-23 NOTE — Discharge Planning (Signed)
LCSW went and spoke with patient at bedside regarding updates. Patient was informed that LCSW is working on arranging an aftercare appt for therapy and medication management in [place]. Patient expressed understanding.   LCSW contacted [facility] in [place] [phone] to inquire about aftercare appt. Intake Assessment has been scheduled for June 28, 2023 at 10:00am. Patient is advised to arrive 15 minutes prior to appointment and present photo ID and insurance card to front desk. Information has been provided to the patient and patient reports appreciation for assistance. Patient aware that she will likely discharge over the weekend or Monday morning. No other needs were reported at this time.   LCSW will continue to follow and provide support to patient while in [facility].   Fernande Boyden, LCSW Clinical Social Worker [facility] Ph: [phone]

## 2023-06-23 NOTE — Discharge Instructions (Signed)
Intake Assessment has been scheduled for June 28, 2023 at 10:00am. Please arrive 15 minutes prior to appointment and present photo ID and insurance card to front desk.   HALFWAY HOUSES:  [email or web address]  12 STEP PROGRAMS:  Alcoholics Anonymous of [place] Software[employer]alet.be  Narcotics Anonymous of [place] [employer]ct[employer]dk  Al-Anon of [place], [place] www.[place]alanon.org/find-meetings.html  Nar-Anon https://nar-anon.org/find-a-meetin  List of Residential placements:   [facility] in [place]: [phone]  [facility]: [phone]  [place], [place] [phone]: Female and female facility; 30-day program: (uninsured and Medicaid such as Laurena Bering, [place], [place], partners)  [facility]: [phone]; men and women's facility; 28 days; Can have Medicaid tailored plan Tour manager or Partners)  [facility]: [phone] Karoline Caldwell or Larita Fife; 28 day program; must be fully detox; tailored Medicaid or no insurance  [address] in [place], [place]; [phone]; 28 day all males program; no insurance accepted  [facility] Referral in [place]: Gabriel Rung [phone] (no insurance or Medicaid only); 90 days; outpatient services but provide housing in apartments [place]  [facility] Admission: [phone]: Patient must complete phone screening for placement: [place], [place]; 6 month program; uninsured, Medicaid, and [employer].   [facility]: no insurance required; [phone]  [facility]: [phone]; Intake: Molly Maduro; Must fill out application online; Alecia Lemming Delay [phone] x [phone] [facility] in [place], [place]: [phone]; Admissions Coordinators Mr. Maurine Minister or Barron Alvine; 90 day program.  [facility]: [place], [place] [phone]; Co-Ed 9 month to a year program; Online application; Men entry fee is  $500 (6-87months);  [employer]: [address] [place], [place] [address]; no fee or insurance required; minimum of 2 years; Highly structured; work based; Intake Coordinator is Thayer Ohm [phone]  [facility] in [place], [place]: [phone]; Fax number is [phone]; website: [email or web address].[facility].org; Requires 3-6 page autobiography; 2 year program (18 months and then 53month transitional housing); Admission fee is $300; no insurance needed; work Automotive engineer in [place], [place]: [employer] Desk Staff: Danise Edge [phone]: They have a Men's Regenerations Program 6-153months. Free program; There is an initial $300 fee however, they are willing to work with patients regarding that. Application is online.  [facility]: Admissions [phone] Doran Heater ext [phone]; Any 7-90 day program is out of pocket; 12 month program is free of charge; there is a $275 entry fee; Patient is responsible for own transportation

## 2023-06-23 NOTE — ED Notes (Signed)
Attempt x2 to draw labs this morning. Pt encouraged to drink more fluids.

## 2023-06-23 NOTE — Group Note (Signed)
Group Topic: Recovery Basics  Group Date: 06/23/2023 Start Time: 1030 End Time: 1100 Facilitators: Jenean Lindau, RN  Department: [facility]  Number of Participants: 3  Group Focus: chemical dependency education Treatment Modality:  Cognitive Behavioral Therapy Interventions utilized were patient education Purpose: enhance coping skills, express feelings, increase insight, and relapse prevention strategies  Name: Judy Robles Date of Birth: 1976-07-04  MR: [id number]    Level of Participation: active Quality of Participation: attentive Interactions with others: gave feedback Mood/Affect: appropriate Triggers (if applicable):   Cognition: coherent/clear Progress: Gaining insight Response:   Plan: follow-up needed  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   MDD (major depressive disorder), severe (HCC) 12/18/2018   HCAP (healthcare-associated pneumonia) 09/16/2018   LLL pneumonia 09/16/2018   Sepsis (HCC) 09/16/2018   Hyponatremia 09/16/2018   Hypokalemia 09/16/2018   Severe benzodiazepine use disorder (HCC) 07/10/2018   Bipolar II disorder (HCC)    Severe recurrent major depression without psychotic features (HCC) 06/05/2018   PTSD (post-traumatic stress disorder) 11/21/2017   Major depressive disorder, recurrent episode, moderate (HCC) 11/05/2017   Community acquired pneumonia of right lower lobe of lung 09/02/2017   Sepsis associated hypotension (HCC) 09/02/2017   AKI (acute kidney injury) (HCC) 09/02/2017   Nausea and vomiting 09/02/2017   Drug overdose 04/22/2017   Chest pain 10/09/2016   Pressure injury of skin 10/05/2016   Polysubstance dependence including opioid type drug with complication, episodic  abuse (HCC) 07/21/2016   MDD (major depressive disorder), recurrent severe, without psychosis (HCC) 07/21/2016   Chronic pain 07/03/2015   Diarrhea 05/01/2014   Primary hypertension 04/17/2014

## 2023-06-23 NOTE — ED Provider Notes (Signed)
Behavioral Health Progress Note  Date and Time: 06/23/2023 1:59 PM Name: Judy Robles MRN:  [id number]  Subjective: Patient says she is doing "okay" today. She is worried about her upcoming court date which she says has already been scheduled 5-6 times. She endorses experiencing cravings for the following substances: opioids ("just a little bit").  She is interested in going to rehab but is unable to do so because of the upcoming court date.  Patient does not endorse any side-effects they attribute to medications. Patient does not endorse any somatic complaints  Diagnosis:  Final diagnoses:  Opioid use disorder, severe, dependence (HCC)  Tobacco use disorder  Alcohol use disorder   Total Time spent with patient: 45 minutes   Substance Use History: Opioids: Patient is currently abusing opioids and reports needing help detoxing. Previously on Suboxone, which she found helpful, but stopped due to lack of transportation to her provider. Last use prior to admission: using for the past 4 years. Uses percocets, purchased form a Consulting civil engineer. This started when pt had surgery.  Denies heroin or fentanyl.  Onset of use: Route: PO use. Frequency/Amount of Use: Uses daily, 5-6 tablets of 10 mg pills.  Hx of withdrawals: insomnia, restlessness, agitated, GI disturbances Periods of sobriety: 1-1.5 years of sobriety due to a "change of atmosphere" Alcohol: last used 2 days ago.  Onset of use: has been drinking heavily int he past year Frequency/Amount of Use: daily, drinks about 6 pack of beer Hx of withdrawals: denies seizures, denies DTs Periods of sobriety: Tobacco: smokes daily, 1 ppd for the past 10 years Cannabis: Denies Cocaine: UDS positive for cocaine, patient reports "one-time use" Methamphetamines: Denies Benzodiazepines: reports hx of use, but uses rarely. Reports no recent use.  IV Drug Use History: Denies Rehabilitation History:  Detoxed in [facility] previously [facility]   Past  Psychiatric History: Current Psychiatrist: No Current Therapist: No Previous Psychiatric Diagnoses: Bipolar 2, Major Depressive Disorder (MDD), Post-Traumatic Stress Disorder (PTSD), polysubstance abuse. Current Psychiatric Medications: Hx of noncompliance. Previously on zoloft, paroxetine, sertraline, venlafaxine Past Hospitalizations: Previously hospitalized in April 2020 at [facility]   Past Medical History: Primary Care Provider: None Medical Diagnoses: Chronic Obstructive Pulmonary Disease (COPD), Hypertension (HTN). Allergies: Tylenol and Lisinopril Trauma: Patient's son was shot and killed 4 years ago in [place]. Seizures: No   Family History: Unknown to pt    Social History: Living Situation: Per patient she is staying with a friend in [place]. She recently started a relationship, has a female partner who is supportive. Denies any abuse.  Social Support: Family lives in [place] and [place]. 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 Systems developer History: No  Sleep: Good  Appetite: Good  Current Medications: Current Facility-Administered Medications  Medication Dose Route Frequency Provider Last Rate Last Admin   alum & mag hydroxide-simeth (MAALOX/MYLANTA) 200-200-20 MG/5ML suspension 30 mL  30 mL Oral Q4H PRN Augusto Gamble, MD       baclofen (LIORESAL) tablet 5 mg  5 mg Oral Q8H  PRN Augusto Gamble, MD       bismuth subsalicylate (PEPTO BISMOL) chewable tablet 524 mg  524 mg Oral Q3H PRN Augusto Gamble,  MD       cloNIDine (CATAPRES) tablet 0.1 mg  0.1 mg Oral Q4H PRN Augusto Gamble, MD       dicyclomine (BENTYL) tablet 20 mg  20 mg Oral Q6H PRN Augusto Gamble, MD       haloperidol (HALDOL) tablet 5 mg  5 mg Oral Q6H PRN Augusto Gamble, MD       And   LORazepam (ATIVAN) tablet 1 mg  1 mg Oral Q6H PRN Augusto Gamble, MD       And   diphenhydrAMINE (BENADRYL) capsule 25 mg  25 mg Oral Q6H PRN Augusto Gamble, MD       haloperidol lactate (HALDOL) injection 5 mg  5 mg Intramuscular Q6H PRN Augusto Gamble, MD       And   LORazepam (ATIVAN) injection 1 mg  1 mg Intravenous Q6H PRN Augusto Gamble, MD       And   diphenhydrAMINE (BENADRYL) injection 25 mg  25 mg Intramuscular Q6H PRN Augusto Gamble, MD       gabapentin (NEURONTIN) capsule 100 mg  100 mg Oral TID Lorri Frederick, MD   100 mg at 06/23/23 0913   hydrOXYzine (ATARAX) tablet 25 mg  25 mg Oral TID PRN Augusto Gamble, MD       loperamide (IMODIUM) capsule 2-4 mg  2-4 mg Oral PRN Augusto Gamble, MD       LORazepam (ATIVAN) tablet 1 mg  1 mg Oral Q6H PRN Augusto Gamble, MD       Or   LORazepam (ATIVAN) injection 1 mg  1 mg Intramuscular Q6H PRN Augusto Gamble, MD       multivitamin with minerals tablet 1 tablet  1 tablet Oral Daily Carrion-Carrero, Margely, MD   1 tablet at 06/23/23 0914   naproxen (NAPROSYN) tablet 500 mg  500 mg Oral BID PRN Augusto Gamble, MD       nicotine (NICODERM CQ - dosed in mg/24 hours) patch 21 mg  21 mg Transdermal Daily PRN Carrion-Carrero, Karle Starch, MD       nicotine polacrilex (NICORETTE) gum 2 mg  2 mg Oral PRN Augusto Gamble, MD       ondansetron [facility]) tablet 8 mg  8 mg Oral Q8H PRN Augusto Gamble, MD       polyethylene glycol (MIRALAX / GLYCOLAX) packet 17 g  17 g Oral Daily PRN Augusto Gamble, MD       senna Mancel Parsons) tablet 8.6 mg  1 tablet Oral QHS PRN Augusto Gamble, MD       thiamine (VITAMIN B1) tablet 100 mg  100 mg Oral Daily Carrion-Carrero, Margely, MD   100 mg at 06/23/23 0914   traZODone (DESYREL) tablet 100 mg  100 mg Oral QHS  Weber, Kyra A, NP   100 mg at 06/22/23 2119   Current Outpatient Medications  Medication Sig Dispense Refill   gabapentin (NEURONTIN) 400 MG capsule Take 2 capsules (800 mg total) by mouth 4 (four) times daily. (Patient not taking: Reported on 06/20/2023) 30 capsule 0   ibuprofen (ADVIL) 600 MG tablet Take 1 tablet (600 mg total) by mouth every 6 (six) hours as needed. (Patient not taking: Reported on 06/20/2023) 30 tablet 0   lurasidone (LATUDA) 20 MG TABS tablet Take 1 tablet (20 mg total) by mouth daily with supper. For mood (Patient not taking:  Reported on 02/04/2020) 30 tablet 0   nicotine (NICODERM CQ - DOSED IN MG/24 HOURS) 21 mg/24hr patch Place 1 patch (21 mg total) onto the skin daily. For smoking cessation (Patient not taking: Reported on 02/04/2020) 28 patch 0   ondansetron (ZOFRAN) 4 MG tablet Take 1 tablet (4 mg total) by mouth every 8 (eight) hours as needed for nausea or vomiting. (Patient not taking: Reported on 06/20/2023) 12 tablet 0   traZODone (DESYREL) 50 MG tablet Take 1 tablet (50 mg total) by mouth at bedtime as needed for sleep. (Patient not taking: Reported on 06/20/2023) 30 tablet 0   venlafaxine XR (EFFEXOR-XR) 150 MG 24 hr capsule Take 2 capsules (300 mg total) by mouth daily with breakfast. (Patient not taking: Reported on 06/20/2023) 60 capsule 0    Labs  Lab Results:     Latest Ref Rng & Units 06/20/2023    4:17 PM 09/01/2021    5:27 AM 02/18/2020    2:14 AM  CBC  WBC 4.0 - 10.5 K/uL 12.4  7.8  23.8   Hemoglobin 12.0 - 15.0 g/dL 40.1  02.7  25.3   Hematocrit 36.0 - 46.0 % 36.5  42.5  36.3   Platelets 150 - 400 K/uL 254  203  162       Latest Ref Rng & Units 06/20/2023    4:17 PM 09/01/2021    5:27 AM 02/18/2020    2:14 AM  CMP  Glucose 70 - 99 mg/dL 664  99  91   BUN 6 - 20 mg/dL 17  9  14    Creatinine 0.44 - 1.00 mg/dL 4.03  4.74  2.59   Sodium 135 - 145 mmol/L 130  142  133   Potassium 3.5 - 5.1 mmol/L 4.5  3.6  3.6   Chloride 98 - 111 mmol/L 97  108   101   CO2 22 - 32 mmol/L 22  21  20    Calcium 8.9 - 10.3 mg/dL 9.0  8.5  8.5   Total Protein 6.5 - 8.1 g/dL 8.5  7.1    Total Bilirubin 0.3 - 1.2 mg/dL 1.6  0.5    Alkaline Phos 38 - 126 U/L 67  68    AST 15 - 41 U/L 63  123    ALT 0 - 44 U/L 43  110      Blood Alcohol level:  Lab Results  Component Value Date   ETH <10 06/20/2023   ETH 230 (H) 09/01/2021   Metabolic Disorder Labs: Lab Results  Component Value Date   HGBA1C 5.3 06/06/2018   MPG 105.41 06/06/2018   MPG 114 04/16/2014   No results found for: "PROLACTIN" Lab Results  Component Value Date   CHOL 180 06/06/2018   TRIG 128 06/06/2018   HDL 49 06/06/2018   CHOLHDL 3.7 06/06/2018   VLDL 26 06/06/2018   LDLCALC 105 (H) 06/06/2018   LDLCALC 50 09/05/2017   Therapeutic Lab Levels: No results found for: "LITHIUM" No results found for: "VALPROATE" No results found for: "CBMZ" Physical Findings   AIMS    Flowsheet Row [facility]) [facility] (Dis[facility]) [facility] (Dis[facility]) [facility]) [facility]  AIMS Total Score 0 0 0 0 0  AUDIT    Flowsheet Row ED from 06/21/2023 in [facility] Admission (Discharged) [facility] (Dis[facility] (Discharged) [facility] (Dis[facility]) [facility]  Alcohol Use Disorder Identification Test Final Score (AUDIT) 19 0 0 1 0      PHQ2-9    Flowsheet  Row ED from 06/21/2023 in [facility] Office Visit from 05/01/2014 in [facility]  PHQ-2 Total Score 5 0  PHQ-9 Total Score 22 --      Flowsheet Row ED from 06/21/2023 in [facility] ED from 06/20/2023 in [facility] at [facility] ED from 09/01/2021 in [facility] Emergency Department at [facility]  C-SSRS RISK CATEGORY No Risk No Risk No Risk       Musculoskeletal  Strength & Muscle Tone: within normal limits Gait & Station: normal Patient leans: N/A  Psychiatric Specialty Exam  Presentation  General Appearance: Appropriate for Environment; Fairly Groomed   Eye Contact:Good   Speech:Clear and Coherent   Speech Volume:Normal   Handedness:-- (Not assessed)   Mood and Affect  Mood:-- ("a little anxious")   Affect:Appropriate; Congruent; Blunt   Thought Process  Thought Processes:Coherent; Goal Directed; Linear   Descriptions of Associations:Intact   Orientation:Full (Time, Place and Person)   Thought Content:WDL  Diagnosis of Schizophrenia or Schizoaffective disorder in past: No data recorded   Hallucinations:Hallucinations: None   Ideas of Reference:None   Suicidal Thoughts:Suicidal Thoughts: No   Homicidal Thoughts:Homicidal Thoughts: No   Sensorium  Memory:Immediate Good; Recent Good; Remote Good   Judgment:Good   Insight:Good   Executive Functions  Concentration:Good   Attention Span:Good   Recall:Good   Fund of Knowledge:Good   Language:Good   Psychomotor Activity  Psychomotor Activity:Psychomotor Activity: Normal   Assets  Assets:Resilience   Sleep  Sleep:Sleep: Good   Nutritional Assessment (For OBS and [facility] admissions only) Has the patient had a weight loss or gain of 10 pounds or more in the last 3 months?: No Has the patient had a decrease in food intake/or appetite?: No Does the  patient have dental problems?: No Does the patient have eating habits or behaviors that may be indicators of an eating disorder including binging or inducing vomiting?: No Has the patient recently lost weight without trying?: 0 Has the patient been eating poorly because of a decreased appetite?: 0 Malnutrition Screening Tool Score: 0    Physical Exam  Physical Exam Vitals and nursing note reviewed.  HENT:     Head: Normocephalic and atraumatic.  Pulmonary:     Effort: Pulmonary effort is normal.  Musculoskeletal:     Cervical back: Normal range of motion.  Neurological:     General: No focal deficit present.     Mental Status: She is alert.    Review of Systems  Constitutional: Negative.   Respiratory: Negative.    Cardiovascular: Negative.   Gastrointestinal: Negative.   Genitourinary: Negative.   Psychiatric/Behavioral:         Psychiatric subjective data addressed in PSE or HPI / daily subjective report   Blood pressure (!) 153/82, pulse 68, temperature 98.3 F (36.8 C), temperature source Oral, resp. rate 16, SpO2 97%. There is no height or weight on file to calculate BMI.  Treatment Plan Summary: Daily  contact with patient to assess and evaluate symptoms and progress in treatment and Medication management:  -- continue gabapentin 100 mg three times daily for anxiety and alcohol cravings  -- continue trazodone 100 mg at bedtime for insomnia -- CIWA with symptom-triggered lorazepam protocol -- COWS with as needed clonidine protocol -- Patient in need of nicotine replacement; nicotine polacrilex (gum) and nicotine patch 21 mg / 24 hours ordered. Smoking cessation encouraged  PRNs              -- continue hydroxyzine 25 mg three times a day as needed for anxiety              -- continue bismuth subsalicylate 524 mg oral chewable tablet every 3 hours as needed for indigestion              -- continue senna 8.6 mg oral at bedtime as needed and polyethylene glycol 17 g oral  daily as needed for mild to moderate constipation              -- continue ondansetron 8 mg every 8 hours as needed for nausea or vomiting              -- continue aluminum-magnesium hydroxide + simethicone 30 mL every 4 hours as needed for heartburn -- Opiate withdrawal supportive care: as needed loperamide, naproxen, dicyclomine, and baclofen   -- As needed agitation protocol in-place  Augusto Gamble, MD 06/23/23 1:59 PM   Augusto Gamble, MD 06/23/23 1401

## 2023-06-23 NOTE — ED Notes (Signed)
Patient in the bedroom composed and sleeping. NAD, Respirations are even and unlabored. Will keep monitoring for safety.

## 2023-06-23 NOTE — ED Notes (Addendum)
 Patient in the bedroom calm and composed. NAD. Respirations are even and unlabored. Will continue to monitor for safety.

## 2023-06-23 NOTE — ED Notes (Signed)
Patient got out of bed earlier, took a shower and ate dinner.  Patient is now sitting in dayroom watching a movie with a peer.  No distress or withdrawal at this time.  Patient is calm and pleasant.  Will monitor and provide a safe environment.

## 2023-06-23 NOTE — ED Notes (Signed)
Pt asleep at this hour. No apparent distress. RR even and unlabored. Monitored for safety.  

## 2023-06-23 NOTE — Group Note (Signed)
Group Topic: Communication  Group Date: 06/23/2023 Start Time: 2000 End Time: 2100 Facilitators: Darin Engels  Department: [facility]  Number of Participants: 2  Group Focus: acceptance, anxiety, communication, coping skills, and goals/reality orientation Treatment Modality:  Leisure Development Interventions utilized were leisure development and story telling Purpose: enhance coping skills, express feelings, improve communication skills, increase insight, regain self-worth, and relapse prevention strategies  Name: Judy Robles Date of Birth: 1976/02/18  MR: [id number]    Level of Participation: pt did not attend group  Quality of Participation: pt did not attend group  Interactions with others: positive interaction with  Mood/Affect: appropriate and positive Triggers (if applicable): n/a Cognition: coherent/clear Progress: Gaining insight Response: pt has positive interaction with staff and pts on the unit Plan: patient will be encouraged to attend groups.   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   MDD (major depressive disorder), severe (HCC) 12/18/2018   HCAP (healthcare-associated pneumonia) 09/16/2018   LLL pneumonia 09/16/2018   Sepsis (HCC) 09/16/2018   Hyponatremia 09/16/2018   Hypokalemia 09/16/2018   Severe benzodiazepine use disorder (HCC) 07/10/2018   Bipolar II disorder (HCC)    Severe recurrent major depression without psychotic features (HCC) 06/05/2018   PTSD (post-traumatic stress disorder) 11/21/2017   Major depressive disorder, recurrent episode, moderate (HCC) 11/05/2017   Community acquired pneumonia of right lower lobe of lung 09/02/2017   Sepsis associated hypotension (HCC) 09/02/2017   AKI (acute  kidney injury) (HCC) 09/02/2017   Nausea and vomiting 09/02/2017   Drug overdose 04/22/2017   Chest pain 10/09/2016   Pressure injury of skin 10/05/2016   Polysubstance dependence including opioid type drug with complication, episodic abuse (HCC) 07/21/2016   MDD (major depressive disorder), recurrent severe, without psychosis (HCC) 07/21/2016   Chronic pain 07/03/2015   Diarrhea 05/01/2014   Primary hypertension 04/17/2014

## 2023-06-23 NOTE — ED Notes (Addendum)
Pt states that she slept well last night. Pt reports sweating this morning. VS WNL. COWS=1.

## 2023-06-23 NOTE — ED Notes (Signed)
Patient remains asleep in bed without issue or complaint.  Will monitor.   

## 2023-06-24 DIAGNOSIS — F1120 Opioid dependence, uncomplicated: Secondary | ICD-10-CM | POA: Diagnosis not present

## 2023-06-24 MED ORDER — GABAPENTIN 300 MG PO CAPS
300.0000 mg | ORAL_CAPSULE | Freq: Every day | ORAL | Status: DC
  Administered 2023-06-24 – 2023-06-25 (×2): 300 mg via ORAL
  Filled 2023-06-24 (×2): qty 1

## 2023-06-24 MED ORDER — GABAPENTIN 100 MG PO CAPS
100.0000 mg | ORAL_CAPSULE | Freq: Two times a day (BID) | ORAL | Status: DC
  Administered 2023-06-24 – 2023-06-26 (×4): 100 mg via ORAL
  Filled 2023-06-24 (×4): qty 1

## 2023-06-24 MED ORDER — TRAZODONE HCL 50 MG PO TABS
50.0000 mg | ORAL_TABLET | Freq: Every day | ORAL | Status: DC
  Administered 2023-06-24: 50 mg via ORAL
  Filled 2023-06-24: qty 1

## 2023-06-24 NOTE — ED Notes (Signed)
Pt is in the dayroom watching TV with peers. Pt denies SI/HI/AVH. No acute distress noted. Will continue to monitor for safety. 

## 2023-06-24 NOTE — Group Note (Unsigned)
Group Topic: Recovery Basics  Group Date: 06/24/2023 Start Time: 1215 End Time: 1240 Facilitators: Jenean Lindau, RN  Department: [facility]  Number of Participants: 6  Group Focus: chemical dependency education Treatment Modality:  Cognitive Behavioral Therapy Interventions utilized were patient education Purpose: enhance coping skills, express feelings, increase insight, regain self-worth, reinforce self-care, and relapse prevention strategies   Name: Judy Robles Date of Birth: March 27, 1976  MR: [id number]    Level of Participation: {THERAPIES; PSYCH GROUP PARTICIPATION WGNFA:21308} Quality of Participation: {THERAPIES; PSYCH QUALITY OF PARTICIPATION:23992} Interactions with others: {THERAPIES; PSYCH INTERACTIONS:23993} Mood/Affect: {THERAPIES; PSYCH MOOD/AFFECT:23994} Triggers (if applicable): *** Cognition: {THERAPIES; PSYCH COGNITION:23995} Progress: {THERAPIES; PSYCH PROGRESS:23997} Response: *** Plan: {THERAPIES; PSYCH MVHQ:46962}  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   MDD (major depressive disorder), severe (HCC) 12/18/2018   HCAP (healthcare-associated pneumonia) 09/16/2018   LLL pneumonia 09/16/2018   Sepsis (HCC) 09/16/2018   Hyponatremia 09/16/2018   Hypokalemia 09/16/2018   Severe benzodiazepine use disorder (HCC) 07/10/2018   Bipolar II disorder (HCC)    Severe recurrent major depression without psychotic features (HCC) 06/05/2018   PTSD (post-traumatic stress disorder) 11/21/2017   Major depressive disorder, recurrent episode, moderate (HCC) 11/05/2017   Community acquired pneumonia of right lower lobe of lung 09/02/2017   Sepsis associated hypotension (HCC) 09/02/2017   AKI (acute kidney injury)  (HCC) 09/02/2017   Nausea and vomiting 09/02/2017   Drug overdose 04/22/2017   Chest pain 10/09/2016   Pressure injury of skin 10/05/2016   Polysubstance dependence including opioid type drug with complication, episodic abuse (HCC) 07/21/2016   MDD (major depressive disorder), recurrent severe, without psychosis (HCC) 07/21/2016   Chronic pain 07/03/2015   Diarrhea 05/01/2014   Primary hypertension 04/17/2014

## 2023-06-24 NOTE — ED Provider Notes (Signed)
Behavioral Health Progress Note  Date and Time: 06/24/2023 2:02 PM Name: Judy Robles MRN:  [id number]  Subjective:  Judy Robles is a 47 y.o. female, with PMH of OUD, AUD, cocaine use, tobacco use d/o, who presented to [facility] (06/21/2023), then admitted to [facility] for assistance to get to residential rehab   She is having difficulties with sleep due to restless legs. They were bad when she was in the ED and are better now, but still interfering with sleep.  Mood is "ok", appetite is appropriate.  Denied rx side effects.  Denied active and passive SI, HI, AVH, paranoia.   Review of Systems  Constitutional:  Positive for malaise/fatigue.  Respiratory:  Negative for shortness of breath.   Cardiovascular:  Negative for chest pain.  Gastrointestinal:  Negative for nausea and vomiting.  Musculoskeletal:        Restless legs  Neurological:  Negative for dizziness, weakness and headaches.     Diagnosis:  Final diagnoses:  Opioid use disorder, severe, dependence (HCC)  Tobacco use disorder  Alcohol use disorder   Substance Use History: Opioids: Patient is currently abusing opioids and reports needing help detoxing. Previously on Suboxone, which she found helpful, but stopped due to lack of transportation to her provider. Last use prior to admission: using for the past 4 years. Uses percocets, purchased form a Consulting civil engineer. This started when pt had surgery.  Denies heroin or fentanyl.  Onset of use: Route: PO use. Frequency/Amount of Use: Uses daily, 5-6 tablets of 10 mg pills.  Hx of withdrawals: insomnia, restlessness, agitated, GI disturbances Periods of sobriety: 1-1.5 years of sobriety due to a "change of atmosphere" Alcohol: last used 2 days ago.  Onset of use: has been drinking heavily int he past year Frequency/Amount of Use: daily, drinks about 6 pack of beer Hx of withdrawals: denies seizures, denies DTs Periods of sobriety: Tobacco: smokes daily, 1 ppd for the past 10  years Cannabis: Denies Cocaine: UDS positive for cocaine, patient reports "one-time use" Methamphetamines: Denies Benzodiazepines: reports hx of use, but uses rarely. Reports no recent use.  IV Drug Use History: Denies Rehabilitation History:  Detoxed in [facility] previously [facility]   Past Psychiatric History: Current Psychiatrist: No Current Therapist: No Previous Psychiatric Diagnoses: Bipolar 2, Major Depressive Disorder (MDD), Post-Traumatic Stress Disorder (PTSD), polysubstance abuse. Current Psychiatric Medications: Hx of noncompliance. Previously on zoloft, paroxetine, sertraline, venlafaxine Past Hospitalizations: Previously hospitalized in April 2020 at [facility]  Past Medical History: Primary Care Provider: None Medical Diagnoses: Chronic Obstructive Pulmonary Disease (COPD), Hypertension (HTN). Allergies: Tylenol and Lisinopril Trauma: Patient's son was shot and killed 4 years ago in [place]. Seizures: No   Family History: Unknown to pt    Social History: Living Situation: Per patient she is staying with a friend in [place]. She recently started a relationship, has a female partner who is supportive. Denies any abuse.  Social Support: Family lives in [place] and [place]. 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 Systems developer History:  No  Total Time spent with patient: 30 minutes  Additional Social History:                         Current Medications:  Current Facility-Administered  Medications  Medication Dose Route Frequency Provider Last Rate Last Admin   alum & mag hydroxide-simeth (MAALOX/MYLANTA) 200-200-20 MG/5ML suspension 30 mL  30 mL Oral Q4H PRN Augusto Gamble, MD       baclofen (LIORESAL) tablet 5 mg  5 mg Oral Q8H PRN Augusto Gamble, MD       bismuth subsalicylate (PEPTO BISMOL) chewable tablet 524 mg  524 mg Oral Q3H PRN Augusto Gamble, MD       cloNIDine (CATAPRES) tablet 0.1 mg  0.1 mg Oral Q4H PRN Augusto Gamble, MD       dicyclomine (BENTYL) tablet 20 mg  20 mg Oral Q6H PRN Augusto Gamble, MD       haloperidol (HALDOL) tablet 5 mg  5 mg Oral Q6H PRN Augusto Gamble, MD       And   LORazepam (ATIVAN) tablet 1 mg  1 mg Oral Q6H PRN Augusto Gamble, MD       And   diphenhydrAMINE (BENADRYL) capsule 25 mg  25 mg Oral Q6H PRN Augusto Gamble, MD       haloperidol lactate (HALDOL) injection 5 mg  5 mg Intramuscular Q6H PRN Augusto Gamble, MD       And   LORazepam (ATIVAN) injection 1 mg  1 mg Intravenous Q6H PRN Augusto Gamble, MD       And   diphenhydrAMINE (BENADRYL) injection 25 mg  25 mg Intramuscular Q6H PRN Augusto Gamble, MD       [START ON 06/25/2023] gabapentin (NEURONTIN) capsule 100 mg  100 mg Oral BID Princess Bruins, DO       gabapentin (NEURONTIN) capsule 300 mg  300 mg Oral QHS Princess Bruins, DO       hydrOXYzine (ATARAX) tablet 25 mg  25 mg Oral TID PRN Augusto Gamble, MD   25 mg at 06/23/23 2104   loperamide (IMODIUM) capsule 2-4 mg  2-4 mg Oral PRN Augusto Gamble, MD       LORazepam (ATIVAN) tablet 1 mg  1 mg Oral Q6H PRN Augusto Gamble, MD       Or   LORazepam (ATIVAN) injection 1 mg  1 mg Intramuscular Q6H PRN Augusto Gamble, MD       multivitamin with minerals tablet 1 tablet  1 tablet Oral Daily Carrion-Carrero, Margely, MD   1 tablet at 06/24/23 0929   naproxen (NAPROSYN) tablet 500 mg  500 mg Oral BID PRN Augusto Gamble, MD   500 mg at 06/23/23 2105   nicotine (NICODERM CQ - dosed in mg/24 hours) patch 21 mg  21 mg Transdermal Daily PRN Carrion-Carrero, Karle Starch, MD       nicotine  polacrilex (NICORETTE) gum 2 mg  2 mg Oral PRN Augusto Gamble, MD       ondansetron [facility]) tablet 8 mg  8 mg Oral Q8H PRN Augusto Gamble, MD       polyethylene glycol (MIRALAX / GLYCOLAX) packet 17 g  17 g Oral Daily PRN Augusto Gamble, MD       senna (SENOKOT) tablet 8.6 mg  1 tablet Oral QHS PRN Augusto Gamble, MD       thiamine (VITAMIN B1) tablet 100 mg  100 mg Oral Daily Carrion-Carrero, Margely, MD   100 mg at 06/24/23 [phone]  traZODone (DESYREL) tablet 50 mg  50 mg Oral QHS Princess Bruins, DO       Current Outpatient Medications  Medication Sig Dispense Refill   gabapentin (NEURONTIN) 400 MG capsule Take 2 capsules (800 mg total) by mouth 4 (four) times daily. (Patient not taking: Reported on 06/20/2023) 30 capsule 0   ibuprofen (ADVIL) 600 MG tablet Take 1 tablet (600 mg total) by mouth every 6 (six) hours as needed. (Patient not taking: Reported on 06/20/2023) 30 tablet 0   lurasidone (LATUDA) 20 MG TABS tablet Take 1 tablet (20 mg total) by mouth daily with supper. For mood (Patient not taking: Reported on 02/04/2020) 30 tablet 0   nicotine (NICODERM CQ - DOSED IN MG/24 HOURS) 21 mg/24hr patch Place 1 patch (21 mg total) onto the skin daily. For smoking cessation (Patient not taking: Reported on 02/04/2020) 28 patch 0   ondansetron (ZOFRAN) 4 MG tablet Take 1 tablet (4 mg total) by mouth every 8 (eight) hours as needed for nausea or vomiting. (Patient not taking: Reported on 06/20/2023) 12 tablet 0   traZODone (DESYREL) 50 MG tablet Take 1 tablet (50 mg total) by mouth at bedtime as needed for sleep. (Patient not taking: Reported on 06/20/2023) 30 tablet 0   venlafaxine XR (EFFEXOR-XR) 150 MG 24 hr capsule Take 2 capsules (300 mg total) by mouth daily with breakfast. (Patient not taking: Reported on 06/20/2023) 60 capsule 0    Labs  Lab Results:  Admission on 06/20/2023, Discharged on 06/21/2023  Component Date Value Ref Range Status   Sodium 06/20/2023 130 (L)  135 - 145 mmol/L Final   Potassium  06/20/2023 4.5  3.5 - 5.1 mmol/L Final   HEMOLYSIS AT THIS LEVEL MAY AFFECT RESULT   Chloride 06/20/2023 97 (L)  98 - 111 mmol/L Final   CO2 06/20/2023 22  22 - 32 mmol/L Final   Glucose, Bld 06/20/2023 109 (H)  70 - 99 mg/dL Final   Glucose reference range applies only to samples taken after fasting for at least 8 hours.   BUN 06/20/2023 17  6 - 20 mg/dL Final   Creatinine, Ser 06/20/2023 1.03 (H)  0.44 - 1.00 mg/dL Final   Calcium 16/05/9603 9.0  8.9 - 10.3 mg/dL Final   Total Protein 54/04/8118 8.5 (H)  6.5 - 8.1 g/dL Final   Albumin 14/78/2956 3.6  3.5 - 5.0 g/dL Final   AST 21/30/8657 63 (H)  15 - 41 U/L Final   HEMOLYSIS AT THIS LEVEL MAY AFFECT RESULT   ALT 06/20/2023 43  0 - 44 U/L Final   HEMOLYSIS AT THIS LEVEL MAY AFFECT RESULT   Alkaline Phosphatase 06/20/2023 67  38 - 126 U/L Final   Total Bilirubin 06/20/2023 1.6 (H)  0.3 - 1.2 mg/dL Final   HEMOLYSIS AT THIS LEVEL MAY AFFECT RESULT   GFR, Estimated 06/20/2023 >60  >60 mL/min Final   Comment: (NOTE) Calculated using the CKD-EPI Creatinine Equation (2021)    Anion gap 06/20/2023 11  5 - 15 Final   Performed at [facility], [address] [place], [place] [address]   WBC 06/20/2023 12.4 (H)  4.0 - 10.5 K/uL Final   RBC 06/20/2023 3.75 (L)  3.87 - 5.11 MIL/uL Final   Hemoglobin 06/20/2023 12.0  12.0 - 15.0 g/dL Final   HCT 29/52/8413 36.5  36.0 - 46.0 % Final   MCV 06/20/2023 97.3  80.0 - 100.0 fL Final   MCH 06/20/2023 32.0  26.0 - 34.0 pg  Final   MCHC 06/20/2023 32.9  30.0 - 36.0 g/dL Final   RDW 09/81/1914 13.2  11.5 - 15.5 % Final   Platelets 06/20/2023 254  150 - 400 K/uL Final   nRBC 06/20/2023 0.0  0.0 - 0.2 % Final   Performed at [facility] [address] [place], [place] [address]   Color, Urine 06/21/2023 YELLOW  YELLOW Final   APPearance 06/21/2023 CLEAR  CLEAR Final   Specific Gravity, Urine 06/21/2023 1.006  1.005 - 1.030 Final   pH 06/21/2023 6.0  5.0 - 8.0  Final   Glucose, UA 06/21/2023 NEGATIVE  NEGATIVE mg/dL Final   Hgb urine dipstick 06/21/2023 SMALL (A)  NEGATIVE Final   Bilirubin Urine 06/21/2023 NEGATIVE  NEGATIVE Final   Ketones, ur 06/21/2023 5 (A)  NEGATIVE mg/dL Final   Protein, ur 62/13/0865 NEGATIVE  NEGATIVE mg/dL Final   Nitrite 78/46/9629 NEGATIVE  NEGATIVE Final   Leukocytes,Ua 06/21/2023 LARGE (A)  NEGATIVE Final   RBC / HPF 06/21/2023 6-10  0 - 5 RBC/hpf Final   WBC, UA 06/21/2023 >50  0 - 5 WBC/hpf Final   Bacteria, UA 06/21/2023 MANY (A)  NONE SEEN Final   Squamous Epithelial / HPF 06/21/2023 0-5  0 - 5 /HPF Final   Hyaline Casts, UA 06/21/2023 PRESENT   Final   Performed at [facility], [address], [place], [place] [address]   Preg, Serum 06/20/2023 NEGATIVE  NEGATIVE Final   Comment:        THE SENSITIVITY OF THIS METHODOLOGY IS >10 mIU/mL. Performed at [facility], [address], [place], [place] [address]    Alcohol, Ethyl (B) 06/20/2023 <10  <10 mg/dL Final   Comment: (NOTE) Lowest detectable limit for serum alcohol is 10 mg/dL.  For medical purposes only. Performed at [facility], [address] [place], [place] [address]    Opiates 06/21/2023 NONE DETECTED  NONE DETECTED Final   Cocaine 06/21/2023 POSITIVE (A)  NONE DETECTED Final   Benzodiazepines 06/21/2023 NONE DETECTED  NONE DETECTED Final   Amphetamines 06/21/2023 NONE DETECTED  NONE DETECTED Final   Tetrahydrocannabinol 06/21/2023 NONE DETECTED  NONE DETECTED Final   Barbiturates 06/21/2023 NONE DETECTED  NONE DETECTED Final   Comment: (NOTE) DRUG SCREEN FOR MEDICAL PURPOSES ONLY.  IF CONFIRMATION IS NEEDED FOR ANY PURPOSE, NOTIFY LAB WITHIN 5 DAYS.  LOWEST DETECTABLE LIMITS FOR URINE DRUG SCREEN Drug Class                     Cutoff (ng/mL) Amphetamine and metabolites    1000 Barbiturate and metabolites    200 Benzodiazepine                 200 Opiates and metabolites         300 Cocaine and metabolites        300 THC                            50 Performed at [facility], [address], [place], [place] [address]     Blood Alcohol level:  Lab Results  Component Value Date   ETH <10 06/20/2023   ETH 230 (H) 09/01/2021    Metabolic Disorder Labs: Lab Results  Component Value Date   HGBA1C 5.3 06/06/2018   MPG 105.41 06/06/2018   MPG 114 04/16/2014   No results found for: "PROLACTIN" Lab Results  Component Value Date   CHOL 180 06/06/2018   TRIG 128 06/06/2018   HDL 49 06/06/2018   CHOLHDL 3.7 06/06/2018   VLDL 26 06/06/2018   LDLCALC 105 (H) 06/06/2018   LDLCALC 50 09/05/2017    Therapeutic Lab Levels: No results found for: "LITHIUM" No results found for: "VALPROATE" No results found for: "CBMZ"  Physical Findings   AIMS    Flowsheet Row [facility]) [facility] Ad[facility] (Discharged) [facility] Ad[facility] (Discharged) [facility] (Dis[facility]) [facility]  AIMS Total Score 0 0 0 0 0      AUDIT    Flowsheet Row ED from 06/21/2023 in [facility] Admission (Discharged) [facility] Ad[facility] (Discharged) [facility] Ad[facility] (Discharged) [facility] Ad[facility] (Discharged) [facility]  Alcohol Use Disorder Identification Test Final Score (AUDIT) 19 0 0 1 0      PHQ2-9    Flowsheet Row ED from 06/21/2023 in [facility] Office Visit from  05/01/2014 in [facility]  PHQ-2 Total Score 5 0  PHQ-9 Total Score 22 --      Flowsheet Row ED from 06/21/2023 in [facility] ED from 06/20/2023 in [facility] Department at [facility] ED from 09/01/2021 in [facility] Emergency Department at [facility]  C-SSRS RISK CATEGORY No Risk No Risk No Risk        Musculoskeletal  Strength & Muscle Tone: within normal limited Gait & Station: normal  Patient leans: NA   Psychiatric Specialty Exam  Presentation  General Appearance:  Appropriate for Environment; Casual   Eye Contact: Good   Speech: Clear and Coherent; Normal Rate   Speech Volume: Normal   Handedness: Right    Mood and Affect  Mood: Anxious   Affect: Appropriate; Congruent; Constricted    Thought Process  Thought Processes: Coherent; Goal Directed; Linear   Descriptions of Associations:Intact   Orientation:Full (Time, Place and Person)   Thought Content:Logical; Rumination   Diagnosis of Schizophrenia or Schizoaffective disorder in past: No   Hallucinations:Hallucinations: None   Ideas of Reference:None   Suicidal Thoughts:Suicidal Thoughts: No   Homicidal Thoughts:Homicidal Thoughts: No    Sensorium  Memory: Immediate Good  Judgment: Fair  Insight: Fair   Executive Functions  Concentration: Good  Attention Span: Good  Recall: Good  Fund of Knowledge: Good  Language: Good   Psychomotor Activity  Psychomotor Activity: Psychomotor Activity: Psychomotor Retardation   Assets  Assets: Communication Skills; Desire for Improvement; Resilience   Sleep  Sleep: Sleep: Poor   Physical Exam  Physical Exam Vitals and nursing note reviewed.  Constitutional:      General: She is not in acute distress.    Appearance: She is not ill-appearing, toxic-appearing or diaphoretic.  HENT:     Head: Normocephalic and atraumatic.   Pulmonary:     Effort: Pulmonary effort is normal. No respiratory distress.  Neurological:     General: No focal deficit present.     Mental Status: She is alert and oriented to person, place, and time.  Blood pressure (!) 149/75, pulse 60, temperature 98.3 F (36.8 C), temperature source Oral, resp. rate 17, SpO2 99%. There is no height or weight on file to calculate BMI.  Treatment Plan Summary: Daily contact with patient to assess and evaluate symptoms and progress in treatment and Medication management  Recommend naltrexone outpatient, will not start here to avoid instant withdraw sxs.   AUD, severe (no sz or DT) CIWA with ativan PRN per protocol with thiamine & MV supplement INCREASED home gabapentin 100 mg TID to gabapentin 100/100/300 mg  DECREASED home trazodone 100 mg to 50 mg  OUD, severe COWS with clonidine PRN per protocol  Restless Legs Suspect 2/2 substance use, will r/o iron induced with ferritin. Gabapentin has helped, will increase at PM to help her sleep. Since she's a fall risk, decreasing trazodone to avoid oversedation. Gabapentin per above F/u ferritin level  Abnormal CMP See results Repeat CMP  Dispo:  Residential   Princess Bruins, DO Psych Resident, PGY-3 06/24/2023 2:02 PM

## 2023-06-24 NOTE — Group Note (Unsigned)
Group Topic: Recovery Basics  Group Date: 06/24/2023 Start Time: 2000 End Time: 2100 Facilitators: Guss Bunde  Department: [facility]  Number of Participants: 5  Group Focus: chemical dependency education Treatment Modality:  Patient-Centered Therapy Interventions utilized were group exercise Purpose: relapse prevention strategies   Name: Judy Robles Date of Birth: Mar 13, 1976  MR: [id number]    Level of Participation: {THERAPIES; PSYCH GROUP PARTICIPATION NWGNF:62130} Quality of Participation: {THERAPIES; PSYCH QUALITY OF PARTICIPATION:23992} Interactions with others: {THERAPIES; PSYCH INTERACTIONS:23993} Mood/Affect: {THERAPIES; PSYCH MOOD/AFFECT:23994} Triggers (if applicable): *** Cognition: {THERAPIES; PSYCH COGNITION:23995} Progress: {THERAPIES; PSYCH PROGRESS:23997} Response: *** Plan: {THERAPIES; PSYCH QMVH:84696}  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   MDD (major depressive disorder), severe (HCC) 12/18/2018   HCAP (healthcare-associated pneumonia) 09/16/2018   LLL pneumonia 09/16/2018   Sepsis (HCC) 09/16/2018   Hyponatremia 09/16/2018   Hypokalemia 09/16/2018   Severe benzodiazepine use disorder (HCC) 07/10/2018   Bipolar II disorder (HCC)    Severe recurrent major depression without psychotic features (HCC) 06/05/2018   PTSD (post-traumatic stress disorder) 11/21/2017   Major depressive disorder, recurrent episode, moderate (HCC) 11/05/2017   Community acquired pneumonia of right lower lobe of lung 09/02/2017   Sepsis associated hypotension (HCC) 09/02/2017   AKI (acute kidney injury) (HCC) 09/02/2017   Nausea and vomiting 09/02/2017   Drug overdose 04/22/2017   Chest pain 10/09/2016   Pressure  injury of skin 10/05/2016   Polysubstance dependence including opioid type drug with complication, episodic abuse (HCC) 07/21/2016   MDD (major depressive disorder), recurrent severe, without psychosis (HCC) 07/21/2016   Chronic pain 07/03/2015   Diarrhea 05/01/2014   Primary hypertension 04/17/2014

## 2023-06-24 NOTE — ED Notes (Signed)
Patient in the bedroom composed and sleeping. NAD, Respirations are even and unlabored. Will keep monitoring for safety.

## 2023-06-24 NOTE — ED Notes (Signed)
Patient is calm and pleasant on approach.  She is quiet but makes needs known.  She is watching tv with peers at this time.  No complaints or distress.  Will monitor.

## 2023-06-24 NOTE — Group Note (Signed)
Group Topic: Recovery Basics  Group Date: 06/24/2023 Start Time: 1215 End Time: 1240 Facilitators: Jenean Lindau, RN  Department: [facility]  Number of Participants: 5  Group Focus: chemical dependency education Treatment Modality:  Cognitive Behavioral Therapy Interventions utilized were patient education Purpose: explore maladaptive thinking, express feelings, increase insight, regain self-worth, and relapse prevention strategies  Name: CHRYSTEN WOULFE Date of Birth: July 28, 1976  MR: [id number]    Level of Participation: moderate Quality of Participation: attentive Interactions with others: gave feedback Mood/Affect: appropriate Triggers (if applicable):   Cognition: coherent/clear Progress: Moderate Response:   Plan: follow-up needed  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   MDD (major depressive disorder), severe (HCC) 12/18/2018   HCAP (healthcare-associated pneumonia) 09/16/2018   LLL pneumonia 09/16/2018   Sepsis (HCC) 09/16/2018   Hyponatremia 09/16/2018   Hypokalemia 09/16/2018   Severe benzodiazepine use disorder (HCC) 07/10/2018   Bipolar II disorder (HCC)    Severe recurrent major depression without psychotic features (HCC) 06/05/2018   PTSD (post-traumatic stress disorder) 11/21/2017   Major depressive disorder, recurrent episode, moderate (HCC) 11/05/2017   Community acquired pneumonia of right lower lobe of lung 09/02/2017   Sepsis associated hypotension (HCC) 09/02/2017   AKI (acute kidney injury) (HCC) 09/02/2017   Nausea and vomiting 09/02/2017   Drug overdose 04/22/2017   Chest pain 10/09/2016   Pressure injury of skin 10/05/2016   Polysubstance dependence including opioid type drug with  complication, episodic abuse (HCC) 07/21/2016   MDD (major depressive disorder), recurrent severe, without psychosis (HCC) 07/21/2016   Chronic pain 07/03/2015   Diarrhea 05/01/2014   Primary hypertension 04/17/2014

## 2023-06-24 NOTE — ED Notes (Signed)
Patient is sleeping. Respirations equal and unlabored, skin warm and dry. No change in assessment or acuity. Routine safety checks conducted according to facility protocol. Will continue to monitor for safety.   

## 2023-06-24 NOTE — Group Note (Signed)
Group Topic: Relapse and Recovery  Group Date: 06/24/2023 Start Time: 2000 End Time: 2100 Facilitators: Guss Bunde  Department: [facility]  Number of Participants: 5  Group Focus: co-dependency Treatment Modality:  Patient-Centered Therapy Interventions utilized were patient education Purpose: relapse prevention strategies  Name: Judy Robles Date of Birth: [birth year]  MR: [id number]    Level of Participation: active Quality of Participation: attentive Interactions with others: gave feedback Mood/Affect: appropriate Triggers (if applicable):  Cognition: goal directed Progress: Gaining insight Response: [place] AA group was performed Plan: patient will be encouraged to follow up with goals  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   MDD (major depressive disorder), severe (HCC) 12/18/2018   HCAP (healthcare-associated pneumonia) 09/16/2018   LLL pneumonia 09/16/2018   Sepsis (HCC) 09/16/2018   Hyponatremia 09/16/2018   Hypokalemia 09/16/2018   Severe benzodiazepine use disorder (HCC) 07/10/2018   Bipolar II disorder (HCC)    Severe recurrent major depression without psychotic features (HCC) 06/05/2018   PTSD (post-traumatic stress disorder) 11/21/2017   Major depressive disorder, recurrent episode, moderate (HCC) 11/05/2017   Community acquired pneumonia of right lower lobe of lung 09/02/2017   Sepsis associated hypotension (HCC) 09/02/2017   AKI (acute kidney injury) (HCC) 09/02/2017   Nausea and vomiting 09/02/2017   Drug overdose 04/22/2017   Chest pain 10/09/2016   Pressure injury of skin 10/05/2016   Polysubstance dependence including opioid type drug with complication, episodic abuse (HCC) 07/21/2016   MDD  (major depressive disorder), recurrent severe, without psychosis (HCC) 07/21/2016   Chronic pain 07/03/2015   Diarrhea 05/01/2014   Primary hypertension 04/17/2014

## 2023-06-24 NOTE — ED Notes (Signed)
Patient remains asleep in bed without issue or complaint.  Will monitor.   

## 2023-06-25 DIAGNOSIS — F1120 Opioid dependence, uncomplicated: Secondary | ICD-10-CM | POA: Diagnosis not present

## 2023-06-25 DIAGNOSIS — F1090 Alcohol use, unspecified, uncomplicated: Secondary | ICD-10-CM | POA: Diagnosis present

## 2023-06-25 DIAGNOSIS — G2581 Restless legs syndrome: Secondary | ICD-10-CM | POA: Diagnosis present

## 2023-06-25 DIAGNOSIS — F19982 Other psychoactive substance use, unspecified with psychoactive substance-induced sleep disorder: Secondary | ICD-10-CM | POA: Diagnosis present

## 2023-06-25 LAB — URINALYSIS, ROUTINE W REFLEX MICROSCOPIC
Bilirubin Urine: NEGATIVE
Glucose, UA: NEGATIVE mg/dL
Ketones, ur: NEGATIVE mg/dL
Leukocytes,Ua: NEGATIVE
Nitrite: NEGATIVE
Protein, ur: NEGATIVE mg/dL
RBC / HPF: >50 RBC/hpf (ref 0–5)
Specific Gravity, Urine: 1.024 (ref 1.005–1.030)
pH: 5 (ref 5.0–8.0)

## 2023-06-25 LAB — COMPREHENSIVE METABOLIC PANEL
ALT: 41 U/L (ref 0–44)
AST: 45 U/L — ABNORMAL HIGH (ref 15–41)
Albumin: 2.9 g/dL — ABNORMAL LOW (ref 3.5–5.0)
Alkaline Phosphatase: 59 U/L (ref 38–126)
Anion gap: 10 (ref 5–15)
BUN: 17 mg/dL (ref 6–20)
CO2: 24 mmol/L (ref 22–32)
Calcium: 9.2 mg/dL (ref 8.9–10.3)
Chloride: 99 mmol/L (ref 98–111)
Creatinine, Ser: 0.85 mg/dL (ref 0.44–1.00)
GFR, Estimated: >60 mL/min (ref >60)
Glucose, Bld: 110 mg/dL — ABNORMAL HIGH (ref 70–99)
Potassium: 4.1 mmol/L (ref 3.5–5.1)
Sodium: 133 mmol/L — ABNORMAL LOW (ref 135–145)
Total Bilirubin: 0.3 mg/dL (ref 0.3–1.2)
Total Protein: 7.2 g/dL (ref 6.5–8.1)

## 2023-06-25 LAB — FERRITIN: Ferritin: 53 ng/mL (ref 11–307)

## 2023-06-25 MED ORDER — CLONIDINE HCL 0.1 MG PO TABS: 0.1000 mg | ORAL_TABLET | Freq: Every day | ORAL | Status: DC

## 2023-06-25 MED ORDER — TAB-A-VITE/IRON PO TABS
1.0000 | ORAL_TABLET | Freq: Every day | ORAL | Status: DC
  Administered 2023-06-25: 1 via ORAL
  Filled 2023-06-25: qty 1

## 2023-06-25 MED ORDER — CLONIDINE HCL 0.1 MG PO TABS
0.1000 mg | ORAL_TABLET | ORAL | Status: DC
Start: 2023-06-27 — End: 2023-06-26

## 2023-06-25 MED ORDER — LOPERAMIDE HCL 2 MG PO CAPS
2.0000 mg | ORAL_CAPSULE | ORAL | Status: DC | PRN
  Administered 2023-06-25: 4 mg via ORAL
  Filled 2023-06-25: qty 2

## 2023-06-25 MED ORDER — MIRTAZAPINE 15 MG PO TABS
15.0000 mg | ORAL_TABLET | Freq: Every day | ORAL | Status: DC
  Administered 2023-06-25: 15 mg via ORAL
  Filled 2023-06-25: qty 1

## 2023-06-25 MED ORDER — CLONIDINE HCL 0.1 MG PO TABS
0.1000 mg | ORAL_TABLET | Freq: Four times a day (QID) | ORAL | Status: DC
  Administered 2023-06-25 – 2023-06-26 (×4): 0.1 mg via ORAL
  Filled 2023-06-25 (×4): qty 1

## 2023-06-25 NOTE — ED Notes (Signed)
Patient in the bedroom calm and composed. NAD,respirations are even and unlabored. Will continue to monitor for safety.

## 2023-06-25 NOTE — ED Notes (Signed)
Patient is sleeping. Respirations equal and unlabored, skin warm and dry. No change in assessment or acuity. Routine safety checks conducted according to facility protocol. Will continue to monitor for safety.   

## 2023-06-25 NOTE — [facility] Group Notes (Signed)
PsychoEducational Group Group Topic: Positive Reframing and the impact of negative thoughts on mood. Patients were given education on the impact of negative thoughts, and then asked to share one thought they felt was negative that was impacting their mood, that they would like to let go of.  The patient attended and was appropriate.

## 2023-06-25 NOTE — ED Notes (Signed)
Pt was provided dinner.

## 2023-06-25 NOTE — ED Notes (Signed)
Pt presents with depressed mood, affect anxious. Jeanise reports ' I am not doing well. I really am feeling a lot of withdrawal symptoms I had restless legs so bad last night I didn't sleep and I'm nauseated and I feel like the stomach cramps . I am sick. Yesterday I kept having diarrhea and at least that has stopped but I'm not good. '' Pt goes on to share that she is also struggling with the anniversary of her son's death, and that the upcoming holidays are hard for her.  '' You know his birthday is new years eve and just thinking about him, I thought the grief would get easier but it doesn't. '' Allowed pt to ventilate and support given. We also discussed and encouraged po hydration and prn medications given for elevated COWS score as evidenced by pts symptoms reported above. She has been visible on the unit and able to make her needs known, and she denies any SI HI or AV Hallucinations. Nnacy reports a desire for further rehab treatment to maintain sobriety. Will con't to monitor. Pt is safe.

## 2023-06-25 NOTE — Group Note (Signed)
Group Topic: Fears and Unhealthy Coping Skills  Group Date: 06/25/2023 Start Time: 2012 End Time: 2045 Facilitators: Vassie Loll, RN  Department: [facility]  Number of Participants: 7  Group Focus: coping skills Treatment Modality:  Psychoeducation Interventions utilized were support Purpose: enhance coping skills  Name: Judy Robles Date of Birth: May 09, 1976  MR: [id number]    Level of Participation: moderate Quality of Participation: attentive Interactions with others: gave feedback Mood/Affect: appropriate Triggers (if applicable):   Cognition: coherent/clear Progress: Moderate Response:   Plan: follow-up needed  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   MDD (major depressive disorder), severe (HCC) 12/18/2018   HCAP (healthcare-associated pneumonia) 09/16/2018   LLL pneumonia 09/16/2018   Sepsis (HCC) 09/16/2018   Hyponatremia 09/16/2018   Hypokalemia 09/16/2018   Severe benzodiazepine use disorder (HCC) 07/10/2018   Bipolar II disorder (HCC)    Severe recurrent major depression without psychotic features (HCC) 06/05/2018   PTSD (post-traumatic stress disorder) 11/21/2017   Major depressive disorder, recurrent episode, moderate (HCC) 11/05/2017   Community acquired pneumonia of right lower lobe of lung 09/02/2017   Sepsis associated hypotension (HCC) 09/02/2017   AKI (acute kidney injury) (HCC) 09/02/2017   Nausea and vomiting 09/02/2017   Drug overdose 04/22/2017   Chest pain 10/09/2016   Pressure injury of skin 10/05/2016   Polysubstance dependence including opioid type drug  with complication, episodic abuse (HCC) 07/21/2016   MDD (major depressive disorder), recurrent severe, without psychosis (HCC) 07/21/2016   Chronic pain 07/03/2015   Diarrhea 05/01/2014   Primary hypertension 04/17/2014

## 2023-06-25 NOTE — ED Provider Notes (Signed)
Behavioral Health Progress Note  Date and Time: 06/25/2023 4:40 PM Name: Judy Robles MRN:  [id number]  Subjective:  Judy Robles is a 47 y.o. female, with PMH of OUD, AUD, cocaine use, tobacco use d/o, who presented to [facility] (06/21/2023), then admitted to [facility] for assistance to get to residential rehab   Still not sleeping well due to her restless legs. Also noted that she is starting to have muscle aches, diarrhea, and opioid craving. She last took [employer]. She's ok with starting clonidine taper to help with w/d and pain. Trazodone was not helpful at all for sleep. She feels depressed and has low energy. Also anxious. She reported having UTI sxs per ROS below. She is not sure what she is having vaginal bleeding since she has been in menopause for ~68yrs. Discussed this could be multiple different things, including UTI. However, worst case scenario possible cancer and that she needs to see outpatient provider for workup and diagnostics. She also requested STI panel.  Discussed improvement in her labs, but Na is still a tiny bit low. For this reason, discussed starting remeron to address sleep and mood concerns and it has the least likely chance of worsening her Na. She's amenable to this. Stopping her trazodone.   Denied active and passive SI, HI, AVH, paranoia.   Review of Systems  Constitutional:  Positive for malaise/fatigue. Negative for chills, diaphoresis and fever.  Respiratory:  Negative for shortness of breath.   Cardiovascular:  Negative for chest pain.  Gastrointestinal:  Positive for diarrhea and vomiting. Negative for nausea.  Genitourinary:  Positive for frequency, hematuria and urgency. Negative for dysuria and flank pain.  Musculoskeletal:        Restless legs  Neurological:  Negative for dizziness, tremors, weakness and headaches.     Diagnosis:  Final diagnoses:  Opioid use disorder, severe, dependence (HCC)  Tobacco use disorder  Alcohol use disorder    Substance Use History: Opioids: Patient is currently abusing opioids and reports needing help detoxing. Previously on Suboxone, which she found helpful, but stopped due to lack of transportation to her provider. Last use prior to admission: using for the past 4 years. Uses percocets, purchased form a Consulting civil engineer. This started when pt had surgery.  Denies heroin or fentanyl.  Onset of use: Route: PO use. Frequency/Amount of Use: Uses daily, 5-6 tablets of 10 mg pills.  Hx of withdrawals: insomnia, restlessness, agitated, GI disturbances Periods of sobriety: 1-1.5 years of sobriety due to a "change of atmosphere" Alcohol: last used 2 days ago.  Onset of use: has been drinking heavily int he past year Frequency/Amount of Use: daily, drinks about 6 pack of beer Hx of withdrawals: denies seizures, denies DTs Periods of sobriety: Tobacco: smokes daily, 1 ppd for the past 10 years Cannabis: Denies Cocaine: UDS positive for cocaine, patient reports "one-time use" Methamphetamines: Denies Benzodiazepines: reports hx of use, but uses rarely. Reports no recent use.  IV Drug Use History: Denies Rehabilitation History:  Detoxed in [facility] previously [facility]   Past Psychiatric History: Current Psychiatrist: No Current Therapist: No Previous Psychiatric Diagnoses: Bipolar 2, Major Depressive Disorder (MDD), Post-Traumatic Stress Disorder (PTSD), polysubstance abuse. Current Psychiatric Medications: Hx of noncompliance. Previously on zoloft, paroxetine, sertraline, venlafaxine Past Hospitalizations: Previously hospitalized in April 2020 at [facility]  Past Medical History: Primary Care Provider: None Medical Diagnoses: Chronic Obstructive Pulmonary Disease (COPD), Hypertension (HTN). Allergies: Tylenol and Lisinopril Trauma: Patient's son was shot and killed 4 years ago in [place]. Seizures:  No   Family History: Unknown to pt    Social History: Living Situation: Per patient she is staying with a  friend in [place]. She recently started a relationship, has a female partner who is supportive. Denies any abuse.  Social Support: Family lives in [place] and [place]. 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 Systems developer History: No  Total Time spent with patient: 30 minutes  Additional Social History:                         Current Medications:  Current Facility-Administered Medications  Medication Dose Route Frequency Provider Last Rate Last Admin   alum & mag hydroxide-simeth (MAALOX/MYLANTA) 200-200-20 MG/5ML suspension 30 mL  30 mL Oral Q4H PRN Augusto Gamble, MD       baclofen (LIORESAL) tablet 5 mg  5 mg Oral Q8H PRN Augusto Gamble, MD       bismuth subsalicylate (PEPTO BISMOL) chewable tablet 524 mg  524 mg Oral Q3H PRN Augusto Gamble, MD   524 mg at 06/25/23 1425   cloNIDine (CATAPRES) tablet 0.1 mg  0.1 mg Oral Q4H PRN Augusto Gamble, MD   0.1 mg at 06/25/23 0856   cloNIDine (CATAPRES) tablet 0.1 mg  0.1 mg Oral QID Princess Bruins, DO   0.1 mg at 06/25/23 1601   Followed by   Melene Muller ON 06/27/2023] cloNIDine (CATAPRES) tablet 0.1 mg  0.1 mg Oral BH-qamhs Princess Bruins, DO       Followed by   Melene Muller ON 06/29/2023] cloNIDine (CATAPRES) tablet 0.1 mg  0.1 mg Oral QAC breakfast Princess Bruins, DO       dicyclomine (BENTYL) tablet 20 mg  20 mg Oral Q6H PRN Augusto Gamble, MD   20 mg at 06/25/23 1425   haloperidol (HALDOL) tablet 5 mg  5 mg Oral Q6H PRN Augusto Gamble, MD       And   LORazepam (ATIVAN) tablet 1  mg  1 mg Oral Q6H PRN Augusto Gamble, MD       And   diphenhydrAMINE (BENADRYL) capsule 25 mg  25 mg Oral Q6H PRN Augusto Gamble, MD       haloperidol lactate (HALDOL) injection 5 mg  5 mg Intramuscular Q6H PRN Augusto Gamble, MD       And   LORazepam (ATIVAN) injection 1 mg  1 mg Intravenous Q6H PRN Augusto Gamble, MD       And   diphenhydrAMINE (BENADRYL) injection 25 mg  25 mg Intramuscular Q6H PRN Augusto Gamble, MD       gabapentin (NEURONTIN) capsule 100 mg  100 mg Oral BID Princess Bruins, DO   100 mg at 06/25/23 1601   gabapentin (NEURONTIN) capsule 300 mg  300 mg Oral QHS Princess Bruins, DO   300 mg at 06/24/23 2113   hydrOXYzine (ATARAX) tablet 25 mg  25 mg Oral TID PRN Augusto Gamble, MD   25 mg at 06/25/23 1236   loperamide (IMODIUM) capsule 2-4 mg  2-4 mg Oral PRN Princess Bruins, DO   4 mg at 06/25/23 1426   LORazepam (ATIVAN) tablet 1 mg  1 mg Oral Q6H PRN Augusto Gamble, MD       Or   LORazepam (ATIVAN) injection 1 mg  1 mg Intramuscular Q6H PRN Augusto Gamble, MD       mirtazapine (REMERON) tablet 15 mg  15 mg Oral QHS Princess Bruins, DO       multivitamins with iron tablet 1 tablet  1 tablet Oral QHS Princess Bruins, DO       naproxen (NAPROSYN) tablet 500 mg  500 mg Oral BID PRN Augusto Gamble, MD   500 mg at 06/25/23 0856   nicotine (NICODERM CQ - dosed in mg/24 hours) patch 21 mg  21 mg Transdermal Daily PRN Carrion-Carrero, Karle Starch, MD   21 mg at 06/25/23 1030   nicotine polacrilex (NICORETTE) gum 2 mg  2 mg Oral PRN Augusto Gamble, MD       ondansetron [facility]) tablet 8 mg  8 mg Oral Q8H PRN Augusto Gamble, MD   8 mg at 06/25/23 0855   polyethylene glycol (MIRALAX / GLYCOLAX) packet 17 g  17 g Oral Daily PRN Augusto Gamble, MD       senna Mancel Parsons) tablet 8.6 mg  1 tablet Oral QHS PRN Augusto Gamble, MD       thiamine (VITAMIN B1) tablet 100 mg  100 mg Oral Daily Carrion-Carrero, Margely, MD   100 mg at 06/25/23 4098   Current Outpatient Medications  Medication Sig Dispense Refill   gabapentin (NEURONTIN) 400 MG  capsule Take 2 capsules (800 mg total) by mouth 4 (four) times daily. (Patient not taking: Reported on 06/20/2023) 30 capsule 0   ibuprofen (ADVIL) 600 MG tablet Take 1 tablet (600 mg total) by mouth every 6 (six) hours as needed. (Patient not taking: Reported on 06/20/2023) 30 tablet 0   lurasidone (LATUDA) 20 MG TABS tablet Take 1 tablet (20 mg total) by mouth daily with supper. For mood (Patient not taking: Reported on 02/04/2020) 30 tablet 0   nicotine (NICODERM CQ - DOSED IN MG/24 HOURS) 21 mg/24hr patch Place 1 patch (21 mg total) onto the skin daily. For smoking cessation (Patient not taking: Reported on 02/04/2020) 28 patch 0   ondansetron (ZOFRAN) 4 MG tablet Take 1 tablet (4 mg total) by mouth every 8 (eight) hours as needed for nausea or vomiting. (Patient not taking: Reported on 06/20/2023) 12 tablet 0   venlafaxine XR (EFFEXOR-XR) 150 MG 24 hr capsule Take 2 capsules (300 mg total) by mouth daily with breakfast. (Patient not taking: Reported on 06/20/2023) 60 capsule 0    Labs  Lab Results:  Admission on 06/21/2023  Component Date Value Ref Range Status   Sodium 06/24/2023 133 (L)  135 - 145 mmol/L Final   Potassium 06/24/2023 4.1  3.5 - 5.1 mmol/L Final   Chloride 06/24/2023 99  98 - 111 mmol/L Final   CO2 06/24/2023 24  22 - 32 mmol/L Final   Glucose, Bld 06/24/2023 110 (H)  70 - 99 mg/dL Final   Glucose reference range applies only to samples taken after fasting for at least 8 hours.   BUN 06/24/2023 17  6 - 20 mg/dL Final   Creatinine, Ser 06/24/2023 0.85  0.44 - 1.00 mg/dL Final   Calcium 11/91/4782 9.2  8.9 - 10.3 mg/dL Final   Total Protein 95/62/1308 7.2  6.5 - 8.1 g/dL Final   Albumin 65/78/4696 2.9 (L)  3.5 - 5.0 g/dL Final   AST 40/98/1191 45 (H)  15 - 41 U/L Final   ALT 06/24/2023 41  0 - 44 U/L Final   Alkaline Phosphatase 06/24/2023 59  38 - 126 U/L Final   Total Bilirubin 06/24/2023 0.3  0.3 - 1.2 mg/dL Final   GFR, Estimated 06/24/2023 >60  >60 mL/min Final    Comment: (NOTE) Calculated using the CKD-EPI Creatinine Equation (2021)    Anion gap 06/24/2023 10  5 - 15 Final   Performed at [facility], [address] [place], [place] [address]   Ferritin 06/24/2023 53  11 - 307 ng/mL Final   Performed at [facility], [address], [place], [place] [address]   Color, Urine 06/25/2023 AMBER (A)  YELLOW Final   BIOCHEMICALS MAY BE AFFECTED BY COLOR   APPearance 06/25/2023 HAZY (A)  CLEAR Final   Specific Gravity, Urine 06/25/2023 1.024  1.005 - 1.030 Final   pH 06/25/2023 5.0  5.0 - 8.0 Final   Glucose, UA 06/25/2023 NEGATIVE  NEGATIVE mg/dL Final   Hgb urine dipstick 06/25/2023 LARGE (A)  NEGATIVE Final   Bilirubin Urine 06/25/2023 NEGATIVE  NEGATIVE Final   Ketones, ur 06/25/2023 NEGATIVE  NEGATIVE mg/dL Final   Protein, ur 08/65/7846 NEGATIVE  NEGATIVE mg/dL Final   Nitrite 96/29/5284 NEGATIVE  NEGATIVE Final   Leukocytes,Ua 06/25/2023 NEGATIVE  NEGATIVE Final   RBC / HPF 06/25/2023 >50  0 - 5 RBC/hpf Final   WBC, UA 06/25/2023 11-20  0 - 5 WBC/hpf Final   Bacteria, UA 06/25/2023 RARE (A)  NONE SEEN Final   Squamous Epithelial / HPF 06/25/2023 6-10  0 - 5 /HPF Final   Mucus 06/25/2023 PRESENT   Final   Performed at [facility], [address] [place], [place] [address]  Admission on 06/20/2023, Discharged on 06/21/2023  Component Date Value Ref Range Status   Sodium 06/20/2023 130 (L)  135 - 145 mmol/L Final   Potassium 06/20/2023 4.5  3.5 - 5.1 mmol/L Final   HEMOLYSIS AT THIS LEVEL MAY AFFECT RESULT   Chloride 06/20/2023 97 (L)  98 - 111 mmol/L Final   CO2 06/20/2023 22  22 - 32 mmol/L Final   Glucose, Bld 06/20/2023 109 (H)  70 - 99 mg/dL Final   Glucose reference range applies only to samples taken after fasting for at least 8 hours.   BUN 06/20/2023 17  6 - 20 mg/dL Final   Creatinine, Ser 06/20/2023 1.03 (H)  0.44 - 1.00 mg/dL Final   Calcium 08/24/7251 9.0  8.9 - 10.3 mg/dL Final   Total Protein 66/44/0347  8.5 (H)  6.5 - 8.1 g/dL Final   Albumin 42/59/5638 3.6  3.5 - 5.0 g/dL Final   AST 75/64/3329 63 (H)  15 - 41 U/L Final   HEMOLYSIS AT THIS LEVEL MAY AFFECT RESULT   ALT 06/20/2023 43  0 - 44 U/L Final   HEMOLYSIS AT THIS LEVEL MAY AFFECT RESULT   Alkaline Phosphatase 06/20/2023 67  38 - 126 U/L Final   Total Bilirubin 06/20/2023 1.6 (H)  0.3 - 1.2 mg/dL Final   HEMOLYSIS AT THIS LEVEL MAY AFFECT RESULT   GFR, Estimated 06/20/2023 >60  >60 mL/min Final   Comment: (NOTE) Calculated using the CKD-EPI Creatinine Equation (2021)    Anion gap 06/20/2023 11  5 - 15 Final   Performed at [facility], [address], [place], [place] [address]   WBC  06/20/2023 12.4 (H)  4.0 - 10.5 K/uL Final   RBC 06/20/2023 3.75 (L)  3.87 - 5.11 MIL/uL Final   Hemoglobin 06/20/2023 12.0  12.0 - 15.0 g/dL Final   HCT 09/81/1914 36.5  36.0 - 46.0 % Final   MCV 06/20/2023 97.3  80.0 - 100.0 fL Final   MCH 06/20/2023 32.0  26.0 - 34.0 pg Final   MCHC 06/20/2023 32.9  30.0 - 36.0 g/dL Final   RDW 78/29/5621 13.2  11.5 - 15.5 % Final   Platelets 06/20/2023 254  150 - 400 K/uL Final   nRBC 06/20/2023 0.0  0.0 - 0.2 % Final   Performed at [facility], [address] [place], [place] [address]   Color, Urine 06/21/2023 YELLOW  YELLOW Final   APPearance 06/21/2023 CLEAR  CLEAR Final   Specific Gravity, Urine 06/21/2023 1.006  1.005 - 1.030 Final   pH 06/21/2023 6.0  5.0 - 8.0 Final   Glucose, UA 06/21/2023 NEGATIVE  NEGATIVE mg/dL Final   Hgb urine dipstick 06/21/2023 SMALL (A)  NEGATIVE Final   Bilirubin Urine 06/21/2023 NEGATIVE  NEGATIVE Final   Ketones, ur 06/21/2023 5 (A)  NEGATIVE mg/dL Final   Protein, ur 78/46/9629 NEGATIVE  NEGATIVE mg/dL Final   Nitrite 52/84/1324 NEGATIVE  NEGATIVE Final   Leukocytes,Ua 06/21/2023 LARGE (A)  NEGATIVE Final   RBC / HPF 06/21/2023 6-10  0 - 5 RBC/hpf Final   WBC, UA 06/21/2023 >50  0 - 5 WBC/hpf Final   Bacteria, UA 06/21/2023  MANY (A)  NONE SEEN Final   Squamous Epithelial / HPF 06/21/2023 0-5  0 - 5 /HPF Final   Hyaline Casts, UA 06/21/2023 PRESENT   Final   Performed at [facility], [address] [place], [place] [address]   Preg, Serum 06/20/2023 NEGATIVE  NEGATIVE Final   Comment:        THE SENSITIVITY OF THIS METHODOLOGY IS >10 mIU/mL. Performed at [facility], [address] [place], [place] [address]    Alcohol, Ethyl (B) 06/20/2023 <10  <10 mg/dL Final   Comment: (NOTE) Lowest detectable limit for serum alcohol is 10 mg/dL.  For medical purposes only. Performed at [facility], [address] [place], [place] [address]    Opiates 06/21/2023 NONE DETECTED  NONE DETECTED Final   Cocaine 06/21/2023 POSITIVE (A)  NONE DETECTED Final   Benzodiazepines 06/21/2023 NONE DETECTED  NONE DETECTED Final   Amphetamines 06/21/2023 NONE DETECTED  NONE DETECTED Final   Tetrahydrocannabinol 06/21/2023 NONE DETECTED  NONE DETECTED Final   Barbiturates 06/21/2023 NONE DETECTED  NONE DETECTED Final   Comment: (NOTE) DRUG SCREEN FOR MEDICAL PURPOSES ONLY.  IF CONFIRMATION IS NEEDED FOR ANY PURPOSE, NOTIFY LAB WITHIN 5 DAYS.  LOWEST DETECTABLE LIMITS FOR URINE DRUG SCREEN Drug Class                     Cutoff (ng/mL) Amphetamine and metabolites    1000 Barbiturate and metabolites    200 Benzodiazepine                 200 Opiates and metabolites        300 Cocaine and metabolites        300 THC                            50 Performed at [facility], [address] [place],  [place] [address]     Blood Alcohol level:  Lab Results  Component Value Date   ETH <10 06/20/2023   ETH 230 (H) 09/01/2021    Metabolic Disorder Labs: Lab Results  Component Value Date   HGBA1C 5.3 06/06/2018   MPG 105.41 06/06/2018   MPG 114 04/16/2014   No results found for: "PROLACTIN" Lab Results  Component Value Date   CHOL 180  06/06/2018   TRIG 128 06/06/2018   HDL 49 06/06/2018   CHOLHDL 3.7 06/06/2018   VLDL 26 06/06/2018   LDLCALC 105 (H) 06/06/2018   LDLCALC 50 09/05/2017    Therapeutic Lab Levels: No results found for: "LITHIUM" No results found for: "VALPROATE" No results found for: "CBMZ"  Physical Findings   AIMS    Flowsheet Row [facility]) [facility] Ad[facility] (Discharged) [facility] Ad[facility] (Discharged) [facility] ([facility]) [facility]  AIMS Total Score 0 0 0 0 0      AUDIT    Flowsheet Row ED from 06/21/2023 in [facility] Admission (Discharged) [facility] (Discharged) [facility] Ad[facility] (Discharged) [facility] Ad[facility] (Discharged) [facility]  Alcohol Use Disorder Identification Test Final Score (AUDIT) 19 0 0 1 0      PHQ2-9    Flowsheet Row ED from 06/21/2023 in [facility] Office Visit from 05/01/2014 in [facility]  PHQ-2 Total Score 5 0  PHQ-9 Total Score 22 --      Flowsheet Row ED from 06/21/2023 in [facility] ED from [facility]/20/2023 in [facility] Emergency Department at [facility] ED from 09/01/2021 in [facility] Emergency Department at [facility]  C-SSRS RISK CATEGORY No Risk No Risk No Risk        Musculoskeletal  Strength & Muscle Tone: within normal limited Gait & Station: normal  Patient leans: NA    Psychiatric Specialty Exam  Presentation  General Appearance:  Appropriate for Environment; Casual   Eye Contact: Good   Speech: Clear and Coherent; Normal Rate   Speech Volume: Normal   Handedness: Right    Mood and Affect  Mood: Anxious   Affect: Appropriate; Congruent; Constricted    Thought Process  Thought Processes: Coherent; Goal Directed; Linear   Descriptions of Associations:Intact   Orientation:Full (Time, Place and Person)   Thought Content:Logical; Rumination   Diagnosis of Schizophrenia or Schizoaffective disorder in past: No   Hallucinations:Hallucinations: None   Ideas of Reference:None   Suicidal Thoughts:Suicidal Thoughts: No   Homicidal Thoughts:Homicidal Thoughts: No    Sensorium  Memory: Immediate Good  Judgment: Fair  Insight: Fair   Executive Functions  Concentration: Good  Attention Span: Good  Recall: Good  Fund of Knowledge: Good  Language: Good   Psychomotor Activity  Psychomotor Activity: Psychomotor Activity: Psychomotor Retardation   Assets  Assets: Communication Skills; Desire for Improvement; Resilience   Sleep  Sleep: Sleep: Poor   Physical Exam  Physical Exam Vitals and nursing note reviewed.  Constitutional:      General: She is not in acute distress.  Appearance: She is not ill-appearing, toxic-appearing or diaphoretic.  HENT:     Head: Normocephalic and atraumatic.  Pulmonary:     Effort: Pulmonary effort is normal. No respiratory distress.  Neurological:     General: No focal deficit present.     Mental Status: She is alert and oriented to person, place, and time.    Blood pressure (!) 116/54, pulse 62, temperature 97.6 F (36.4 C), temperature source Oral, resp. rate 16, SpO2 100%. There is no height or weight on file to calculate BMI.  Treatment Plan Summary: Daily contact with patient to assess and evaluate symptoms and progress in treatment and  Medication management  Recommend naltrexone outpatient, will not start here to avoid instant withdraw sxs. Na improved, could be from psych rx, but I believe unlikely, however will change her to remeron as it has the least risk of lowering Na, also for additional help with mood and sleep since trazodone didn't help.   AUD, severe (no sz or DT)  MDD Substance induced sleep d/o CIWA with ativan PRN per protocol with thiamine & MV supplement Continued home gabapentin 100/100/300 mg  DISCONTINUED home trazodone 50 mg STARTED remeron 15 mg qHS  OUD, severe Started to have w/d sxs with is consistent with suboxone half-life. Clonidine per below.  COWS with clonidine PRN per protocol STARTED clonidine taper  Restless Legs Syndrome Started the day before [facility] admission while she was at the ED. Suspect 2/2 substance use + low ferritin, w/o anemia. Fe <70, repleating with MV per below. Gabapentin has helped, considered increasing further to help with legs, but since starting clonidine for w/d per above, will hold off at this time.  Gabapentin per above STARTED MV+Fe  Low Na, mild Na 133, improved form 130 on repeat labs. Changed trazodone to remeron per above.  Recommend outpatient f/u  Suspected UTI Vaginal bleeding S&S of UTI, she just completed antibiotic right before [facility] admission, obtaining labs per below to see if she's still has a UTI and for antibiotic sensitivity. Repeat UA + reflex STI panel Recommend outpatient workup and diagnostics  Dispo:  Residential   Princess Bruins, DO Psych Resident, PGY-3 06/25/2023 4:40 PM

## 2023-06-25 NOTE — ED Notes (Signed)
Pt reporting increasing withdrawal symptoms with complaints of diarrhea, abdominal cramping and gas. PRN medications given for the above.

## 2023-06-25 NOTE — Group Note (Signed)
Group Topic: Positive Affirmations  Group Date: 06/25/2023 Start Time: 0950 End Time: 1020 Facilitators: Londell Moh, NT  Department: [facility]  Number of Participants: 4  Group Focus: check in, daily focus, and goals/reality orientation Treatment Modality:  Psychoeducation Interventions utilized were patient education Purpose: increase insight  Name: Judy Robles Date of Birth: 07-15-1976  MR: [id number]    Level of Participation: active Quality of Participation: attentive and engaged Interactions with others: gave feedback Mood/Affect: appropriate and positive Triggers (if applicable): n/a Cognition: coherent/clear and goal directed Progress: Gaining insight Response: Pt was active during group, she was able to identify her goal for the day and was able to write about what she is grateful for.  Plan: patient will be encouraged to continue to attend groups.  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   MDD (major depressive disorder), severe (HCC) 12/18/2018   HCAP (healthcare-associated pneumonia) 09/16/2018   LLL pneumonia 09/16/2018   Sepsis (HCC) 09/16/2018   Hyponatremia 09/16/2018   Hypokalemia 09/16/2018   Severe benzodiazepine use disorder (HCC) 07/10/2018   Bipolar II disorder (HCC)    Severe recurrent major depression without psychotic features (HCC) 06/05/2018   PTSD (post-traumatic stress disorder) 11/21/2017   Major depressive disorder, recurrent episode, moderate (HCC) 11/05/2017   Community acquired pneumonia of right lower lobe of lung 09/02/2017   Sepsis associated hypotension (HCC) 09/02/2017   AKI (acute kidney injury) (HCC) 09/02/2017   Nausea and vomiting 09/02/2017   Drug overdose 04/22/2017    Chest pain 10/09/2016   Pressure injury of skin 10/05/2016   Polysubstance dependence including opioid type drug with complication, episodic abuse (HCC) 07/21/2016   MDD (major depressive disorder), recurrent severe, without psychosis (HCC) 07/21/2016   Chronic pain 07/03/2015   Diarrhea 05/01/2014   Primary hypertension 04/17/2014

## 2023-06-25 NOTE — ED Notes (Signed)
Pt with positive response from prn bentyl, immodium and pepto bismol given earlier.

## 2023-06-25 NOTE — ED Notes (Signed)
Pt was provided breakfast.

## 2023-06-25 NOTE — ED Notes (Signed)
Patient in the hallway speaking on the phone. Seems comfortable. NAD. No new complaints at the moment. Will continue to monitor for safety.

## 2023-06-26 DIAGNOSIS — F1120 Opioid dependence, uncomplicated: Secondary | ICD-10-CM | POA: Diagnosis not present

## 2023-06-26 MED ORDER — HYDROXYZINE HCL 25 MG PO TABS: 25.0000 mg | ORAL_TABLET | Freq: Three times a day (TID) | ORAL | 0 refills | Status: AC | PRN

## 2023-06-26 MED ORDER — NICOTINE 21 MG/24HR TD PT24: 21.0000 mg | MEDICATED_PATCH | Freq: Every day | TRANSDERMAL | 0 refills | Status: DC

## 2023-06-26 MED ORDER — MIRTAZAPINE 15 MG PO TABS: 15.0000 mg | ORAL_TABLET | Freq: Every day | ORAL | 0 refills | Status: DC

## 2023-06-26 MED ORDER — GABAPENTIN 300 MG PO CAPS: 300.0000 mg | ORAL_CAPSULE | Freq: Every day | ORAL | 0 refills | Status: DC

## 2023-06-26 MED ORDER — GABAPENTIN 100 MG PO CAPS: 100.0000 mg | ORAL_CAPSULE | Freq: Two times a day (BID) | ORAL | 0 refills | Status: DC

## 2023-06-26 NOTE — ED Provider Notes (Signed)
FBC/OBS ASAP Discharge Summary  Date and Time: 06/26/2023 2:33 PM  Name: Judy Robles  MRN:  [id number]   Discharge Diagnoses:  Final diagnoses:  Opioid use disorder, severe, dependence (HCC)  Tobacco use disorder  Alcohol use disorder   Subjective: Patient seen on day of discharge. She denies SI/HI/AVH. She denies opioid withdrawal symptoms, denies diarrhea, reports some nausea. She is stating that she now wants residential but is aware that her options are limited due to upcoming court date on the 7th in [facility]. Discussed with patient that she could re-visit going to residential rehab for treatment once her charges are dismissed. Discussed that she has appointment set up with [facility] on the 6th. She states that clonidine was not helping with withdrawal symptoms. She felt remeron was helpful for sleep.  Stay Summary:  Judy Robles. Judy Robles is a 47 yo female with a previous history of MDD, PTSD, bipolar II disorder,  alcohol use disorder, tobacco use disorder, and opioid use disorder, with prior psychiatric hospitalizations (last at [facility] in 2020). She initially presented to [facility] requesting detox from opioids on 10/29, and was admitted to [facility] on 10/30.  UDS is only positive for cocaine.  EtOH levels were also <10.   During the patient's hospitalization, patient had extensive initial psychiatric evaluation, and follow-up psychiatric evaluations every day.  Psychiatric diagnoses provided upon initial assessment:  -Opiate use disorder, severe, dependence  -Tobacco use disorder -Alcohol use disorder  Patient's psychiatric medications were adjusted on admission:  -CIWA with PRN ativan for CIWA >10  -COWS with as needed clonidine  -Gabapentin 100 TID for anxiety and alcohol cravings   During the hospitalization, other adjustments were made to the patient's psychiatric medication regimen:  -increased home gabapentin 100 TID to 100/100/300  -started remeron for substance-induced  sleep d/o   Patient's care was discussed during the interdisciplinary team meeting every day during the hospitalization.  The patient denied having side effects to prescribed psychiatric medication.  Gradually, patient started adjusting to milieu. The patient was evaluated each day by a clinical provider to ascertain response to treatment. Improvement was noted by the patient's report of decreasing symptoms, improved sleep and appetite, affect, medication tolerance, behavior, and participation in unit programming.  Patient was asked each day to complete a self inventory noting mood, mental status, pain, new symptoms, anxiety and concerns.    Symptoms were reported as significantly decreased or resolved completely by discharge.   On day of discharge, the patient reports that their mood is stable. The patient denied having suicidal thoughts for more than 48 hours prior to discharge.  Patient denies having homicidal thoughts.  Patient denies having auditory hallucinations.  Patient denies any visual hallucinations or other symptoms of psychosis. The patient was motivated to continue taking medication with a goal of continued improvement in mental health.   The patient reports their target psychiatric symptoms of opiate withdrawal responded well to the psychiatric medications, and the patient reports overall benefit other psychiatric hospitalization. Supportive psychotherapy was provided to the patient. The patient also participated in regular group therapy while hospitalized. Coping skills, problem solving as well as relaxation therapies were also part of the unit programming.  Labs were reviewed with the patient, and abnormal results were discussed with the patient.  The patient is able to verbalize their individual safety plan to this provider.  # It is recommended to the patient to continue psychiatric medications as prescribed, after discharge from the hospital.    # It  is recommended to the  patient to follow up with your outpatient psychiatric provider and PCP.  # It was discussed with the patient, the impact of alcohol, drugs, tobacco have been there overall psychiatric and medical wellbeing, and total abstinence from substance use was recommended the patient.ed.  # Prescriptions provided or sent directly to preferred pharmacy at discharge. Patient agreeable to plan. Given opportunity to ask questions. Appears to feel comfortable with discharge.    # In the event of worsening symptoms, the patient is instructed to call the crisis hotline, 911 and or go to the nearest ED for appropriate evaluation and treatment of symptoms. To follow-up with primary care provider for other medical issues, concerns and or health care needs  # Patient was discharged home with a plan to follow up with [facility], appointment on 11/6 at 10am  Total Time spent with patient: 45 minutes  Substance Use History: Opioids: Patient is currently abusing opioids and reports needing help detoxing. Previously on Suboxone, which she found helpful, but stopped due to lack of transportation to her provider. Last use prior to admission: using for the past 4 years. Uses percocets, purchased form a Consulting civil engineer. This started when pt had surgery.  Denies heroin or fentanyl.  Onset of use: Route: PO use. Frequency/Amount of Use: Uses daily, 5-6 tablets of 10 mg pills.  Hx of withdrawals: insomnia, restlessness, agitated, GI disturbances Periods of sobriety: 1-1.5 years of sobriety due to a "change of atmosphere" Alcohol: last used 2 days ago.  Onset of use: has been drinking heavily int he past year Frequency/Amount of Use: daily, drinks about 6 pack of beer Hx of withdrawals: denies seizures, denies DTs Periods of sobriety: Tobacco: smokes daily, 1 ppd for the past 10 years Cannabis: Denies Cocaine: UDS positive for cocaine, patient reports "one-time use" Methamphetamines: Denies Benzodiazepines: reports hx of  use, but uses rarely. Reports no recent use.  IV Drug Use History: Denies Rehabilitation History:  Detoxed in [facility] previously [facility]  Past Psychiatric History:  Current Psychiatrist: No Current Therapist: No Previous Psychiatric Diagnoses: Bipolar 2, Major Depressive Disorder (MDD), Post-Traumatic Stress Disorder (PTSD), polysubstance abuse. Current Psychiatric Medications: Hx of noncompliance. Previously on zoloft, paroxetine, sertraline, venlafaxine Past Hospitalizations: Previously hospitalized in April 2020 at [facility]  Past Medical History:  Primary Care Provider: None Medical Diagnoses: Chronic Obstructive Pulmonary Disease (COPD), Hypertension (HTN). Allergies: Tylenol and Lisinopril Trauma: Patient's son was shot and killed 4 years ago in [place]. Seizures: No   Family History: Unknown to pt   Social History:  Living Situation: Per patient she is staying with a friend in [place]. She recently started a relationship, has a female partner who is supportive. Denies any abuse.  Social Support: Family lives in [place] and [place]. 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 Systems developer History: No  Tobacco Cessation:  A prescription for an FDA-approved tobacco cessation medication provided at discharge  Current Medications:  Current Facility-Administered Medications  Medication Dose Route Frequency Provider Last Rate Last  Admin   alum & mag hydroxide-simeth (MAALOX/MYLANTA) 200-200-20 MG/5ML  suspension 30 mL  30 mL Oral Q4H PRN Augusto Gamble, MD       baclofen (LIORESAL) tablet 5 mg  5 mg Oral Q8H PRN Augusto Gamble, MD       bismuth subsalicylate (PEPTO BISMOL) chewable tablet 524 mg  524 mg Oral Q3H PRN Augusto Gamble, MD   524 mg at 06/25/23 1425   cloNIDine (CATAPRES) tablet 0.1 mg  0.1 mg Oral Q4H PRN Augusto Gamble, MD   0.1 mg at 06/25/23 0856   dicyclomine (BENTYL) tablet 20 mg  20 mg Oral Q6H PRN Augusto Gamble, MD   20 mg at 06/25/23 1425   haloperidol (HALDOL) tablet 5 mg  5 mg Oral Q6H PRN Augusto Gamble, MD       And   LORazepam (ATIVAN) tablet 1 mg  1 mg Oral Q6H PRN Augusto Gamble, MD       And   diphenhydrAMINE (BENADRYL) capsule 25 mg  25 mg Oral Q6H PRN Augusto Gamble, MD       haloperidol lactate (HALDOL) injection 5 mg  5 mg Intramuscular Q6H PRN Augusto Gamble, MD       And   LORazepam (ATIVAN) injection 1 mg  1 mg Intravenous Q6H PRN Augusto Gamble, MD       And   diphenhydrAMINE (BENADRYL) injection 25 mg  25 mg Intramuscular Q6H PRN Augusto Gamble, MD       gabapentin (NEURONTIN) capsule 100 mg  100 mg Oral BID Princess Bruins, DO   100 mg at 06/26/23 0919   gabapentin (NEURONTIN) capsule 300 mg  300 mg Oral QHS Princess Bruins, DO   300 mg at 06/25/23 2050   hydrOXYzine (ATARAX) tablet 25 mg  25 mg Oral TID PRN Augusto Gamble, MD   25 mg at 06/26/23 1610   loperamide (IMODIUM) capsule 2-4 mg  2-4 mg Oral PRN Princess Bruins, DO   4 mg at 06/25/23 1426   LORazepam (ATIVAN) tablet 1 mg  1 mg Oral Q6H PRN Augusto Gamble, MD       Or   LORazepam (ATIVAN) injection 1 mg  1 mg Intramuscular Q6H PRN Augusto Gamble, MD       mirtazapine (REMERON) tablet 15 mg  15 mg Oral QHS Princess Bruins, DO   15 mg at 06/25/23 2050   multivitamins with iron tablet 1 tablet  1 tablet Oral QHS Princess Bruins, DO   1 tablet at 06/25/23 2050   naproxen (NAPROSYN) tablet 500 mg  500 mg Oral BID PRN Augusto Gamble, MD   500 mg at 06/25/23 2048   nicotine (NICODERM CQ - dosed in mg/24 hours) patch 21 mg  21 mg Transdermal Daily PRN  Carrion-Carrero, Karle Starch, MD   21 mg at 06/25/23 1030   nicotine polacrilex (NICORETTE) gum 2 mg  2 mg Oral PRN Augusto Gamble, MD       ondansetron [facility]) tablet 8 mg  8 mg Oral Q8H PRN Augusto Gamble, MD   8 mg at 06/25/23 0855   polyethylene glycol (MIRALAX / GLYCOLAX) packet 17 g  17 g Oral Daily PRN Augusto Gamble, MD       senna Mancel Parsons) tablet 8.6 mg  1 tablet Oral QHS PRN Augusto Gamble, MD       thiamine (VITAMIN B1) tablet 100 mg  100 mg Oral Daily Lorri Frederick, MD   100 mg at 06/26/23 9604   Current Outpatient Medications  Medication Sig Dispense  Refill   gabapentin (NEURONTIN) 100 MG capsule Take 1 capsule (100 mg total) by mouth 2 (two) times daily. 60 capsule 0   gabapentin (NEURONTIN) 300 MG capsule Take 1 capsule (300 mg total) by mouth at bedtime. 30 capsule 0   hydrOXYzine (ATARAX) 25 MG tablet Take 1 tablet (25 mg total) by mouth 3 (three) times daily as needed for itching or anxiety. 30 tablet 0   mirtazapine (REMERON) 15 MG tablet Take 1 tablet (15 mg total) by mouth at bedtime. 30 tablet 0   nicotine (NICODERM CQ - DOSED IN MG/24 HOURS) 21 mg/24hr patch Place 1 patch (21 mg total) onto the skin daily. For smoking cessation 28 patch 0    PTA Medications:  Facility Ordered Medications  Medication   [COMPLETED] cefTRIAXone (ROCEPHIN) injection 1 g   [COMPLETED] sterile water (preservative free) injection   [COMPLETED] cephALEXin (KEFLEX) capsule 500 mg   [COMPLETED] cloNIDine (CATAPRES) tablet 0.1 mg   [COMPLETED] thiamine (VITAMIN B1) injection 100 mg   thiamine (VITAMIN B1) tablet 100 mg   nicotine (NICODERM CQ - dosed in mg/24 hours) patch 21 mg   nicotine polacrilex (NICORETTE) gum 2 mg   LORazepam (ATIVAN) tablet 1 mg   Or   LORazepam (ATIVAN) injection 1 mg   baclofen (LIORESAL) tablet 5 mg   cloNIDine (CATAPRES) tablet 0.1 mg   dicyclomine (BENTYL) tablet 20 mg   [EXPIRED] loperamide (IMODIUM) capsule 2-4 mg   naproxen (NAPROSYN) tablet 500 mg   alum &  mag hydroxide-simeth (MAALOX/MYLANTA) 200-200-20 MG/5ML suspension 30 mL   bismuth subsalicylate (PEPTO BISMOL) chewable tablet 524 mg   hydrOXYzine (ATARAX) tablet 25 mg   ondansetron (ZOFRAN) tablet 8 mg   polyethylene glycol (MIRALAX / GLYCOLAX) packet 17 g   senna (SENOKOT) tablet 8.6 mg   haloperidol lactate (HALDOL) injection 5 mg   And   LORazepam (ATIVAN) injection 1 mg   And   diphenhydrAMINE (BENADRYL) injection 25 mg   haloperidol (HALDOL) tablet 5 mg   And   LORazepam (ATIVAN) tablet 1 mg   And   diphenhydrAMINE (BENADRYL) capsule 25 mg   gabapentin (NEURONTIN) capsule 100 mg   gabapentin (NEURONTIN) capsule 300 mg   mirtazapine (REMERON) tablet 15 mg   loperamide (IMODIUM) capsule 2-4 mg   multivitamins with iron tablet 1 tablet   PTA Medications  Medication Sig   hydrOXYzine (ATARAX) 25 MG tablet Take 1 tablet (25 mg total) by mouth 3 (three) times daily as needed for itching or anxiety.   mirtazapine (REMERON) 15 MG tablet Take 1 tablet (15 mg total) by mouth at bedtime.   nicotine (NICODERM CQ - DOSED IN MG/24 HOURS) 21 mg/24hr patch Place 1 patch (21 mg total) onto the skin daily. For smoking cessation   gabapentin (NEURONTIN) 100 MG capsule Take 1 capsule (100 mg total) by mouth 2 (two) times daily.   gabapentin (NEURONTIN) 300 MG capsule Take 1 capsule (300 mg total) by mouth at bedtime.       06/22/2023   11:08 AM 05/01/2014    2:05 PM  Depression screen PHQ 2/9  Decreased Interest 2 0  Down, Depressed, Hopeless 3 0  PHQ - 2 Score 5 0  Altered sleeping 2   Tired, decreased energy 3   Change in appetite 2   Feeling bad or failure about yourself  3   Trouble concentrating 2   Moving slowly or fidgety/restless 3   Suicidal thoughts 2   PHQ-9 Score 22  Difficult doing work/chores Very difficult     Flowsheet Row ED from 06/21/2023 in [facility] ED from [facility]/20/2023 in [facility] Emergency Department [facility]  ED from 09/01/2021 in [facility] Emergency Department at [facility]  C-SSRS RISK CATEGORY No Risk No Risk No Risk       Musculoskeletal  Strength & Muscle Tone: within normal limits Gait & Station: normal Patient leans: N/A  Psychiatric Specialty Exam  Presentation  General Appearance:  Appropriate for Environment  Eye Contact: Good  Speech: Clear and Coherent  Speech Volume: Normal  Handedness: Right   Mood and Affect  Mood: -- (anxious about discharge)  Affect: Appropriate; Congruent   Thought Process  Thought Processes: Coherent; Goal Directed  Descriptions of Associations:Intact  Orientation:Full (Time, Place and Person)  Thought Content:Logical     Hallucinations:Hallucinations: None  Ideas of Reference:None  Suicidal Thoughts:Suicidal Thoughts: No  Homicidal Thoughts:Homicidal Thoughts: No   Sensorium  Memory: Immediate Fair  Judgment: Fair  Insight: Fair   Art therapist  Concentration: Fair  Attention Span: Fair  Recall: Fair  Fund of Knowledge: Fair  Language: Fair   Psychomotor Activity  Psychomotor Activity:Psychomotor Activity: Normal   Assets  Assets: Communication Skills; Desire for Improvement; Resilience   Sleep  Sleep:Sleep: Fair   No data recorded  Physical Exam  Physical Exam Constitutional:      Appearance: She is obese.  HENT:     Head: Normocephalic and atraumatic.     Mouth/Throat:     Mouth: Mucous membranes are moist.  Pulmonary:     Effort: Pulmonary effort is normal.  Neurological:     General: No focal deficit present.     Mental Status: She is alert.    Review of Systems  Constitutional: Negative.   HENT: Negative.    Eyes: Negative.   Respiratory: Negative.    Cardiovascular: Negative.   Gastrointestinal:  Positive for nausea.  Genitourinary: Negative.   Musculoskeletal: Negative.   Skin: Negative.   Neurological: Negative.   Endo/Heme/Allergies:  Negative.    Blood pressure 121/79, pulse (!) 57, temperature 98.1 F (36.7 C), temperature source Oral, resp. rate 16, SpO2 99%. There is no height or weight on file to calculate BMI.  Demographic Factors:  Low socioeconomic status and Unemployed  Loss Factors: Loss of significant relationship  Historical Factors: Prior psychiatric hospitalizations and treatments  Risk Reduction Factors:   Positive social support  Continued Clinical Symptoms:  Alcohol/Substance Abuse/Dependencies Previous Psychiatric Diagnoses and Treatments  Cognitive Features That Contribute To Risk:  Thought constriction (tunnel vision)    Suicide Risk:  Mild: There are no identifiable plans, no associated intent, mild dysphoria and related symptoms, good self-control (both objective and subjective assessment), few other risk factors, and identifiable protective factors, including available and accessible social support.  Plan Of Care/Follow-up recommendations:  Activity: as tolerated  Diet: heart healthy  Other: -Follow-up with your outpatient psychiatric provider -instructions on appointment date, time, and address (location) are provided to you in discharge paperwork.  -Take your psychiatric medications as prescribed at discharge - instructions are provided to you in the discharge paperwork  -Follow-up with outpatient primary care doctor and other specialists -for management of preventative medicine and chronic medical disease -restless legs   -Testing: Follow-up with outpatient provider for abnormal lab results:  -UA large Hgb  -Na 133 -AST 45  -pending STI testing  -If you are prescribed an atypical antipsychotic medication, we recommend that your outpatient  psychiatrist follow routine screening for side effects within 3 months of discharge, including monitoring: AIMS scale, height, weight, blood pressure, fasting lipid panel, HbA1c, and fasting blood sugar.   -Recommend total abstinence from  alcohol, tobacco, and other illicit drug use at discharge.   -If your psychiatric symptoms recur, worsen, or if you have side effects to your psychiatric medications, call your outpatient psychiatric provider, 911, [phone] or go to the nearest emergency department.  -If suicidal thoughts occur, immediately call your outpatient psychiatric provider, 911, [phone] or go to the nearest emergency department.   Disposition: home with outpatient appointment in [place] with [facility] on 11/6  Karie Fetch, MD, PGY-2 06/26/2023, 2:33 PM

## 2023-06-26 NOTE — ED Notes (Signed)
Patient in the bedroom calm and sleeping. NAD,respirations are even and unlabored. Will continue to monitor for safety.

## 2023-06-26 NOTE — ED Notes (Signed)
Patient remainsasleep in bed without issue or distress.  Will monitor.

## 2023-06-26 NOTE — Group Note (Signed)
Group Topic: Decisional Balance/Substance Abuse  Group Date: 06/26/2023 Start Time: 1000 End Time: 1104 Facilitators: Vonzell Schlatter B  Department: [facility]  Number of Participants: 5  Group Focus: chemical dependency education and daily focus Treatment Modality:  Psychoeducation Interventions utilized were group exercise and support Purpose: relapse prevention strategies  Name: Judy Robles Date of Birth: Nov 15, 1975  MR: [id number]    Level of Participation: minimal Quality of Participation: attentive and cooperative Interactions with others: gave feedback Mood/Affect: positive Triggers (if applicable): n/a Cognition: coherent/clear Progress: Moderate Response: n/a Plan: follow-up needed  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   MDD (major depressive disorder), severe (HCC) 12/18/2018   HCAP (healthcare-associated pneumonia) 09/16/2018   LLL pneumonia 09/16/2018   Sepsis (HCC) 09/16/2018   Hyponatremia 09/16/2018   Hypokalemia 09/16/2018   Severe benzodiazepine use disorder (HCC) 07/10/2018   Bipolar II disorder (HCC)    Severe recurrent major depression without psychotic features (HCC) 06/05/2018   PTSD (post-traumatic stress disorder) 11/21/2017   Major depressive disorder, recurrent episode, moderate (HCC) 11/05/2017   Community acquired pneumonia of right lower lobe of lung 09/02/2017   Sepsis associated hypotension (HCC) 09/02/2017   AKI (acute kidney injury) (HCC) 09/02/2017   Nausea and vomiting 09/02/2017   Drug overdose 04/22/2017   Chest pain 10/09/2016   Pressure injury of  skin 10/05/2016   Polysubstance dependence including opioid type drug with complication, episodic abuse (HCC) 07/21/2016   MDD (major depressive disorder), recurrent severe, without psychosis (HCC) 07/21/2016   Chronic pain 07/03/2015   Diarrhea 05/01/2014   Primary hypertension 04/17/2014

## 2023-06-27 LAB — URINE CYTOLOGY ANCILLARY ONLY
Chlamydia: NEGATIVE
Comment: NEGATIVE
Comment: NEGATIVE
Comment: NORMAL
Neisseria Gonorrhea: NEGATIVE
Trichomonas: NEGATIVE

## 2023-07-22 ENCOUNTER — Emergency Department (HOSPITAL_COMMUNITY)
Admission: EM | Admit: 2023-07-22 | Discharge: 2023-07-22 | Disposition: A | Discharge status: Home / Self Care | Payer: BLUE CROSS/BLUE SHIELD | Attending: Emergency Medicine | Admitting: Emergency Medicine

## 2023-07-22 ENCOUNTER — Other Ambulatory Visit: Payer: Self-pay

## 2023-07-22 ENCOUNTER — Emergency Department (HOSPITAL_COMMUNITY): Payer: BLUE CROSS/BLUE SHIELD

## 2023-07-22 DIAGNOSIS — M791 Myalgia, unspecified site: Secondary | ICD-10-CM | POA: Insufficient documentation

## 2023-07-22 DIAGNOSIS — Z1152 Encounter for screening for COVID-19: Secondary | ICD-10-CM | POA: Diagnosis not present

## 2023-07-22 DIAGNOSIS — I10 Essential (primary) hypertension: Secondary | ICD-10-CM | POA: Diagnosis not present

## 2023-07-22 DIAGNOSIS — R52 Pain, unspecified: Secondary | ICD-10-CM

## 2023-07-22 DIAGNOSIS — F17210 Nicotine dependence, cigarettes, uncomplicated: Secondary | ICD-10-CM | POA: Diagnosis not present

## 2023-07-22 LAB — HCG, SERUM, QUALITATIVE: Preg, Serum: NEGATIVE

## 2023-07-22 LAB — URINALYSIS, ROUTINE W REFLEX MICROSCOPIC
Bilirubin Urine: NEGATIVE
Glucose, UA: NEGATIVE mg/dL
Ketones, ur: NEGATIVE mg/dL
Nitrite: NEGATIVE
Protein, ur: NEGATIVE mg/dL
Specific Gravity, Urine: 1.005 (ref 1.005–1.030)
pH: 6 (ref 5.0–8.0)

## 2023-07-22 LAB — CBC
HCT: 33.5 % — ABNORMAL LOW (ref 36.0–46.0)
Hemoglobin: 10.6 g/dL — ABNORMAL LOW (ref 12.0–15.0)
MCH: 31 pg (ref 26.0–34.0)
MCHC: 31.6 g/dL (ref 30.0–36.0)
MCV: 98 fL (ref 80.0–100.0)
Platelets: 158 10*3/uL (ref 150–400)
RBC: 3.42 MIL/uL — ABNORMAL LOW (ref 3.87–5.11)
RDW: 15.2 % (ref 11.5–15.5)
WBC: 7.4 10*3/uL (ref 4.0–10.5)
nRBC: 0 % (ref 0.0–0.2)

## 2023-07-22 LAB — RESP PANEL BY RT-PCR (RSV, FLU A&B, COVID)  RVPGX2
Influenza A by PCR: NEGATIVE
Influenza B by PCR: NEGATIVE
Resp Syncytial Virus by PCR: NEGATIVE
SARS Coronavirus 2 by RT PCR: NEGATIVE

## 2023-07-22 LAB — COMPREHENSIVE METABOLIC PANEL
ALT: 26 U/L (ref 0–44)
AST: 36 U/L (ref 15–41)
Albumin: 3 g/dL — ABNORMAL LOW (ref 3.5–5.0)
Alkaline Phosphatase: 56 U/L (ref 38–126)
Anion gap: 7 (ref 5–15)
BUN: 12 mg/dL (ref 6–20)
CO2: 22 mmol/L (ref 22–32)
Calcium: 9.3 mg/dL (ref 8.9–10.3)
Chloride: 106 mmol/L (ref 98–111)
Creatinine, Ser: 0.85 mg/dL (ref 0.44–1.00)
GFR, Estimated: >60 mL/min (ref >60)
Glucose, Bld: 111 mg/dL — ABNORMAL HIGH (ref 70–99)
Potassium: 4.3 mmol/L (ref 3.5–5.1)
Sodium: 135 mmol/L (ref 135–145)
Total Bilirubin: 0.5 mg/dL (ref <1.2)
Total Protein: 6.8 g/dL (ref 6.5–8.1)

## 2023-07-22 LAB — CK: Total CK: 268 U/L — ABNORMAL HIGH (ref 38–234)

## 2023-07-22 LAB — ETHANOL: Alcohol, Ethyl (B): <10 mg/dL (ref <10)

## 2023-07-22 MED ORDER — ONDANSETRON 4 MG PO TBDP
4.0000 mg | ORAL_TABLET | Freq: Once | ORAL | Status: AC
  Administered 2023-07-22: 4 mg via ORAL
  Filled 2023-07-22: qty 1

## 2023-07-22 MED ORDER — KETOROLAC TROMETHAMINE 15 MG/ML IJ SOLN
30.0000 mg | Freq: Once | INTRAMUSCULAR | Status: AC
  Administered 2023-07-22: 30 mg via INTRAMUSCULAR
  Filled 2023-07-22: qty 2

## 2023-07-22 NOTE — ED Triage Notes (Signed)
Pt here with generalized chronic pain and fever of 100. Pt will not explain symptoms. Jsut keeps saying I dont know. Pt here last night with the same. Today she is having SOB. Denies chesp pain.

## 2023-07-22 NOTE — Discharge Instructions (Signed)
You were evaluated in the Emergency Department and after careful evaluation, we did not find any emergent condition requiring admission or further testing in the hospital.  Your exam/testing today was overall reassuring.  Symptoms likely due to a viral illness.  Recommend Tylenol and Motrin for discomfort, plenty of fluids and rest.  Please return to the Emergency Department if you experience any worsening of your condition.  Thank you for allowing [place] to be a part of your care.
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
## 2023-07-26 ENCOUNTER — Encounter (HOSPITAL_COMMUNITY): Payer: Self-pay

## 2023-07-26 ENCOUNTER — Other Ambulatory Visit: Payer: Self-pay

## 2023-07-26 ENCOUNTER — Emergency Department (HOSPITAL_COMMUNITY): Payer: MEDICAID

## 2023-07-26 ENCOUNTER — Emergency Department (HOSPITAL_COMMUNITY)
Admission: EM | Admit: 2023-07-26 | Discharge: 2023-07-27 | Disposition: A | Discharge status: Home / Self Care | Payer: MEDICAID | Attending: Emergency Medicine | Admitting: Emergency Medicine

## 2023-07-26 DIAGNOSIS — I10 Essential (primary) hypertension: Secondary | ICD-10-CM | POA: Diagnosis not present

## 2023-07-26 DIAGNOSIS — S0081XA Abrasion of other part of head, initial encounter: Secondary | ICD-10-CM | POA: Insufficient documentation

## 2023-07-26 DIAGNOSIS — X58XXXA Exposure to other specified factors, initial encounter: Secondary | ICD-10-CM | POA: Insufficient documentation

## 2023-07-26 DIAGNOSIS — Z79899 Other long term (current) drug therapy: Secondary | ICD-10-CM | POA: Insufficient documentation

## 2023-07-26 DIAGNOSIS — G9340 Encephalopathy, unspecified: Secondary | ICD-10-CM | POA: Insufficient documentation

## 2023-07-26 DIAGNOSIS — R4182 Altered mental status, unspecified: Secondary | ICD-10-CM

## 2023-07-26 DIAGNOSIS — S0993XA Unspecified injury of face, initial encounter: Secondary | ICD-10-CM | POA: Diagnosis present

## 2023-07-26 LAB — COMPREHENSIVE METABOLIC PANEL
ALT: 37 U/L (ref 0–44)
AST: 52 U/L — ABNORMAL HIGH (ref 15–41)
Albumin: 3.1 g/dL — ABNORMAL LOW (ref 3.5–5.0)
Alkaline Phosphatase: 62 U/L (ref 38–126)
Anion gap: 8 (ref 5–15)
BUN: 9 mg/dL (ref 6–20)
CO2: 23 mmol/L (ref 22–32)
Calcium: 8.9 mg/dL (ref 8.9–10.3)
Chloride: 106 mmol/L (ref 98–111)
Creatinine, Ser: 0.96 mg/dL (ref 0.44–1.00)
GFR, Estimated: >60 mL/min (ref >60)
Glucose, Bld: 82 mg/dL (ref 70–99)
Potassium: 4.5 mmol/L (ref 3.5–5.1)
Sodium: 137 mmol/L (ref 135–145)
Total Bilirubin: 0.5 mg/dL (ref <1.2)
Total Protein: 7.3 g/dL (ref 6.5–8.1)

## 2023-07-26 LAB — HCG, SERUM, QUALITATIVE: Preg, Serum: NEGATIVE

## 2023-07-26 LAB — RAPID URINE DRUG SCREEN, HOSP PERFORMED
Amphetamines: NOT DETECTED
Barbiturates: NOT DETECTED
Benzodiazepines: NOT DETECTED
Cocaine: NOT DETECTED
Opiates: NOT DETECTED
Tetrahydrocannabinol: NOT DETECTED

## 2023-07-26 LAB — CBC
HCT: 34 % — ABNORMAL LOW (ref 36.0–46.0)
Hemoglobin: 10.8 g/dL — ABNORMAL LOW (ref 12.0–15.0)
MCH: 31.1 pg (ref 26.0–34.0)
MCHC: 31.8 g/dL (ref 30.0–36.0)
MCV: 98 fL (ref 80.0–100.0)
Platelets: 195 10*3/uL (ref 150–400)
RBC: 3.47 MIL/uL — ABNORMAL LOW (ref 3.87–5.11)
RDW: 14.7 % (ref 11.5–15.5)
WBC: 8.9 10*3/uL (ref 4.0–10.5)
nRBC: 0 % (ref 0.0–0.2)

## 2023-07-26 LAB — ETHANOL: Alcohol, Ethyl (B): <10 mg/dL (ref <10)

## 2023-07-26 NOTE — ED Triage Notes (Signed)
Pt coming in [facility]. Pt reports that she only took a percocet today . Ems is suspicious of a drug overdose. Ems found pt rolling around on the ground . Incomprehensible speech at that time.  Ems  Cbg 200  Hr 88 Spo2 99% Bp 130/90

## 2023-07-26 NOTE — ED Notes (Signed)
Pt walking in hall with no pants on stating she wanted to go outside to smoke. Patient advise she could not leave to smoke. Pt stated she will just leave is she wants to. This nurse was able to encourage patient back into room.

## 2023-07-26 NOTE — ED Notes (Signed)
Patient transported to CT 

## 2023-07-26 NOTE — ED Notes (Signed)
Pt states "my mouth is too dry I can't talk to you"

## 2023-07-26 NOTE — ED Notes (Signed)
Called patients family to see if they could pick her up, no answer. Called [facility] because this is where patient is living, no answer.

## 2023-07-26 NOTE — ED Notes (Signed)
Spoke with Social work and OK to send patient to [facility] in [place] with cab voucher. Unable to contact [facility] by phone, unsure if patient would have entrance. Standing by at this time .

## 2023-07-26 NOTE — ED Provider Notes (Addendum)
[place] EMERGENCY DEPARTMENT AT [facility] Provider Note   CSN: [id number] Arrival date & time: 07/26/23  1440     History  Chief Complaint  Patient presents with   Drug Overdose    Judy Robles is a 47 y.o. female.   Drug Overdose  Patient reportedly got in for worry for drug overdose.  Reportedly found patient rolling on the ground.  Patient cannot really provide any history.  Does have history of polysubstance abuse and alcohol abuse.  Does have abrasion of the face.  Patient does move extremities but is somewhat somnolent.    Past Medical History:  Diagnosis Date   Arthritis    Bipolar 1 disorder (HCC)    Depression    Hypercholesterolemia    Hypertension    MRSA (methicillin resistant Staphylococcus aureus)    Obesity    Renal disease    Sleep apnea     Home Medications Prior to Admission medications   Medication Sig Start Date End Date Taking? Authorizing Provider  gabapentin (NEURONTIN) 100 MG capsule Take 1 capsule (100 mg total) by mouth 2 (two) times daily. 06/26/23 07/26/23  Karie Fetch, MD  gabapentin (NEURONTIN) 300 MG capsule Take 1 capsule (300 mg total) by mouth at bedtime. 06/26/23 07/26/23  Karie Fetch, MD  hydrOXYzine (ATARAX) 25 MG tablet Take 1 tablet (25 mg total) by mouth 3 (three) times daily as needed for itching or anxiety. 06/26/23 07/26/23  Karie Fetch, MD  metoprolol tartrate (LOPRESSOR) 25 MG tablet Take 25 mg by mouth daily. 07/21/23   [provider]  mirtazapine (REMERON) 15 MG tablet Take 1 tablet (15 mg total) by mouth at bedtime. Patient not taking: Reported on 07/22/2023 06/26/23 07/26/23  Karie Fetch, MD      Allergies    Tylenol [acetaminophen], Lisinopril, and Mirtazapine    Review of Systems   Review of Systems  Physical Exam Updated Vital Signs BP (!) 150/129   Pulse 88   Temp (!) 97.5 F (36.4 C) (Axillary)   Resp 19   Ht 5\' 3"  (1.6 m)   Wt 127 kg   SpO2 98%   BMI 49.60 kg/m   Physical Exam Vitals and nursing note reviewed.  HENT:     Head:     Comments: Abrasions to face without underlying tenderness.  Dried lips.    Mouth/Throat:     Mouth: Mucous membranes are dry.  Eyes:     Pupils: Pupils are equal, round, and reactive to light.  Chest:     Chest wall: No tenderness.  Abdominal:     Tenderness: There is no abdominal tenderness.  Musculoskeletal:        General: No tenderness.     Cervical back: Neck supple. No tenderness.  Skin:    General: Skin is warm.  Neurological:     Mental Status: She is alert.     Comments: Moves extremities.  Follows some commands answer some questions but somewhat somnolent.     ED Results / Procedures / Treatments   Labs (all labs ordered are listed, but only abnormal results are displayed) Labs Reviewed  COMPREHENSIVE METABOLIC PANEL - Abnormal; Notable for the following components:      Result Value   Albumin 3.1 (*)    AST 52 (*)    All other components within normal limits  CBC - Abnormal; Notable for the following components:   RBC 3.47 (*)    Hemoglobin 10.8 (*)    HCT 34.0 (*)  All other components within normal limits  ETHANOL  HCG, SERUM, QUALITATIVE  RAPID URINE DRUG SCREEN, HOSP PERFORMED    EKG None  Radiology CT HEAD WO CONTRAST ( )  Result Date: 07/26/2023 CLINICAL DATA:  Mental status change.  Alcohol/drug use. EXAM: CT HEAD WITHOUT CONTRAST TECHNIQUE: Contiguous axial images were obtained from the base of the skull through the vertex without intravenous contrast. RADIATION DOSE REDUCTION: This exam was performed according to the departmental dose-optimization program which includes automated exposure control, adjustment of the mA and/or kV according to patient size and/or use of iterative reconstruction technique. COMPARISON:  06/20/2017 FINDINGS: Brain: The brain shows a normal appearance without evidence of malformation, atrophy, old or acute small or large vessel infarction, mass  lesion, hemorrhage, hydrocephalus or extra-axial collection. Vascular: No hyperdense vessel. No evidence of atherosclerotic calcification. Skull: Normal.  No traumatic finding.  No focal bone lesion. Sinuses/Orbits: Inflammatory changes of the paranasal sinuses, most advanced affecting the maxillary sinuses. Orbits negative. Other: None significant IMPRESSION: 1. Normal appearance of the brain itself. 2. Inflammatory changes of the paranasal sinuses, most advanced affecting the maxillary sinuses. Electronically Signed   By: Paulina Fusi M.D.   On: 07/26/2023 17:24    Procedures Procedures    Medications Ordered in ED Medications - No data to display  ED Course/ Medical Decision Making/ A&P                                 Medical Decision Making Amount and/or Complexity of Data Reviewed Labs: ordered. Radiology: ordered.   Patient with reported mental status change.  History of polysubstance use.  Does have abrasions on face.  No other tenderness.  Differential diagnose for mental status long but includes causes such as polysubstance use, trauma.  CBG reassuring.  Pupils not constricted or dilated.  Opiates considered but right now protecting airway do not think we need reversal and need other testing to be done.  Social determinant of health includes polysubstance use.  Blood work is overall reassuring.  Negative alcohol level.  Patient now more awake.  Still confused but is up and wandering.  Head CT no intracranial injury.  Patient confusion improving some but still confused.  At this point she is not wearing pants however.  She is eating.  Will continue to allow for likely metabolism.  Improving somewhat still.  However still not wearing pants.  Nursing is attempted to contact her reported group home she lives at and also mother and unable to get through to either.  Patient reportedly had said that she took some Percocets however drug screen was negative.  Also said she had been  drinking alcohol but alcohol level negative.  Seizure considered but felt less likely.  Severe alcohol withdrawal also considered but felt less likely.   Mental status continues to improve.  However reportedly lives at [facility] in [place].  No one answering there.  Will consult TOC about potentially getting back home.       Final Clinical Impression(s) / ED Diagnoses Final diagnoses:  Encephalopathy, unspecified type    Rx / DC Orders ED Discharge Orders     None         Benjiman Core, MD 07/26/23 2231    Benjiman Core, MD 07/26/23 2300

## 2023-07-26 NOTE — ED Notes (Signed)
Pt refusing to be placed on the monitor and to get vitals taken

## 2023-07-27 ENCOUNTER — Emergency Department (HOSPITAL_COMMUNITY)
Admission: EM | Admit: 2023-07-27 | Discharge: 2023-07-27 | Disposition: A | Discharge status: Home / Self Care | Payer: BLUE CROSS/BLUE SHIELD | Attending: Emergency Medicine | Admitting: Emergency Medicine

## 2023-07-27 ENCOUNTER — Emergency Department (HOSPITAL_COMMUNITY): Payer: BLUE CROSS/BLUE SHIELD

## 2023-07-27 DIAGNOSIS — F419 Anxiety disorder, unspecified: Secondary | ICD-10-CM | POA: Insufficient documentation

## 2023-07-27 DIAGNOSIS — M25562 Pain in left knee: Secondary | ICD-10-CM | POA: Diagnosis present

## 2023-07-27 DIAGNOSIS — M25561 Pain in right knee: Secondary | ICD-10-CM | POA: Insufficient documentation

## 2023-07-27 DIAGNOSIS — Z79899 Other long term (current) drug therapy: Secondary | ICD-10-CM | POA: Diagnosis not present

## 2023-07-27 DIAGNOSIS — W19XXXA Unspecified fall, initial encounter: Secondary | ICD-10-CM | POA: Insufficient documentation

## 2023-07-27 MED ORDER — IBUPROFEN 200 MG PO TABS
600.0000 mg | ORAL_TABLET | Freq: Once | ORAL | Status: AC
  Administered 2023-07-27: 600 mg via ORAL
  Filled 2023-07-27: qty 1

## 2023-07-27 MED ORDER — TETANUS-DIPHTH-ACELL PERTUSSIS 5-2.5-18.5 LF-MCG/0.5 IM SUSY: 0.5000 mL | PREFILLED_SYRINGE | Freq: Once | INTRAMUSCULAR | Status: DC

## 2023-07-27 NOTE — ED Notes (Signed)
Patient ambulated to restroom independently and returned to bed. Patient facing wall and talking to herself, tearful, and appears anxious.

## 2023-07-27 NOTE — ED Notes (Signed)
Patient reports that she can take tylenol and she couldn't have it when she "weighed 300lbs and had a fatty liver".

## 2023-07-27 NOTE — ED Notes (Signed)
Was able to get in contact with Judy Robles. Person receiving the call stated that she is no longer a resident there because she has been absent for the past several days. Patgient will be discharged and can wait in lobby until morning

## 2023-07-27 NOTE — ED Provider Notes (Signed)
[place] EMERGENCY DEPARTMENT AT [facility] Provider Note   CSN: [id number] Arrival date & time: 07/27/23  0710     History Chief Complaint  Patient presents with   Knee Pain    LE INGS is a 47 y.o. female presents to the ER for evaluation of bilateral knee pain after a fall last night.  She reports that she skinned her knees with falling.  Nursing note mentions anxiety and depression as her main complaint however patient denies this.  She denies any suicidal homicidal ideations as well.  She reports that she is just here to have her knees seen because it is hurting her and she would also like some food.   Knee Pain      Home Medications Prior to Admission medications   Medication Sig Start Date End Date Taking? Authorizing Provider  gabapentin (NEURONTIN) 100 MG capsule Take 1 capsule (100 mg total) by mouth 2 (two) times daily. 06/26/23 07/26/23  Karie Fetch, MD  gabapentin (NEURONTIN) 300 MG capsule Take 1 capsule (300 mg total) by mouth at bedtime. 06/26/23 07/26/23  Karie Fetch, MD  metoprolol tartrate (LOPRESSOR) 25 MG tablet Take 25 mg by mouth daily. 07/21/23   [provider]  mirtazapine (REMERON) 15 MG tablet Take 1 tablet (15 mg total) by mouth at bedtime. Patient not taking: Reported on 07/22/2023 06/26/23 07/26/23  Karie Fetch, MD      Allergies    Tylenol [acetaminophen], Lisinopril, and Mirtazapine    Review of Systems   Review of Systems  Musculoskeletal:  Positive for arthralgias.    Physical Exam Updated Vital Signs BP (!) 159/73 (BP Location: Right Arm)   Pulse 78   Temp 98.3 F (36.8 C)   Resp 16   SpO2 100%  Physical Exam Constitutional:      Appearance: She is not toxic-appearing.     Comments: Disheveled  Eyes:     General: No scleral icterus. Pulmonary:     Effort: Pulmonary effort is normal. No respiratory distress.  Musculoskeletal:        General: Signs of injury present.     Right lower leg: No  edema.     Left lower leg: No edema.     Comments: Tenderness diffusely to the bilateral anterior knees.  There is minor swelling noted with large superficial abrasions with thin scab overlying it.  No discharge or bleeding noted.  She is able to flex and extend the knees with some pain.  Compartments are soft.  No other tenderness noted.  Palpable DP and PT pulses.  Sensation reportedly intact and symmetric per patient.  Skin:    General: Skin is warm and dry.  Neurological:     Mental Status: She is alert.  Psychiatric:        Thought Content: Thought content does not include homicidal or suicidal ideation. Thought content does not include homicidal or suicidal plan.     ED Results / Procedures / Treatments   Labs (all labs ordered are listed, but only abnormal results are displayed) Labs Reviewed - No data to display  EKG None  Radiology DG Knee 3 Views Right  Result Date: 07/27/2023 CLINICAL DATA:  Fall.  Bilateral knee pain. EXAM: RIGHT KNEE - 3 VIEW; LEFT KNEE - 3 VIEW COMPARISON:  None Available. FINDINGS: No acute fracture or dislocation. No aggressive osseous lesion. There are degenerative changes of bilateral knee joints in the form of mildly reduced medial tibio-femoral compartment joint space and osteophytosis.  No knee effusion or focal soft tissue swelling. No radiopaque foreign bodies. IMPRESSION: *No acute osseous abnormality of bilateral knee joints. *Mild degenerative changes of bilateral knee joints predominantly involving the medial tibiofemoral compartment. Electronically Signed   By: Jules Schick M.D.   On: 07/27/2023 18:13   DG Knee 3 Views Left  Result Date: 07/27/2023 CLINICAL DATA:  Fall.  Bilateral knee pain. EXAM: RIGHT KNEE - 3 VIEW; LEFT KNEE - 3 VIEW COMPARISON:  None Available. FINDINGS: No acute fracture or dislocation. No aggressive osseous lesion. There are degenerative changes of bilateral knee joints in the form of mildly reduced medial tibio-femoral  compartment joint space and osteophytosis. No knee effusion or focal soft tissue swelling. No radiopaque foreign bodies. IMPRESSION: *No acute osseous abnormality of bilateral knee joints. *Mild degenerative changes of bilateral knee joints predominantly involving the medial tibiofemoral compartment. Electronically Signed   By: Jules Schick M.D.   On: 07/27/2023 18:13   CT HEAD WO CONTRAST ( )  Result Date: 07/26/2023 CLINICAL DATA:  Mental status change.  Alcohol/drug use. EXAM: CT HEAD WITHOUT CONTRAST TECHNIQUE: Contiguous axial images were obtained from the base of the skull through the vertex without intravenous contrast. RADIATION DOSE REDUCTION: This exam was performed according to the departmental dose-optimization program which includes automated exposure control, adjustment of the mA and/or kV according to patient size and/or use of iterative reconstruction technique. COMPARISON:  06/20/2017 FINDINGS: Brain: The brain shows a normal appearance without evidence of malformation, atrophy, old or acute small or large vessel infarction, mass lesion, hemorrhage, hydrocephalus or extra-axial collection. Vascular: No hyperdense vessel. No evidence of atherosclerotic calcification. Skull: Normal.  No traumatic finding.  No focal bone lesion. Sinuses/Orbits: Inflammatory changes of the paranasal sinuses, most advanced affecting the maxillary sinuses. Orbits negative. Other: None significant IMPRESSION: 1. Normal appearance of the brain itself. 2. Inflammatory changes of the paranasal sinuses, most advanced affecting the maxillary sinuses. Electronically Signed   By: Paulina Fusi M.D.   On: 07/26/2023 17:24    Procedures Procedures   Medications Ordered in ED Medications - No data to display  ED Course/ Medical Decision Making/ A&P Clinical Course as of 07/27/23 2010  Thu Jul 27, 2023  1623 Evaluated the patient at bedside. She does not mention anxiety and depression. She mentions that she has  bilateral knee pain from a fall last night. She was discharged earlier this morning and has stayed in the lobby since then.  [RR]  2007 I asked the patient if she was having any SI or HI. She denies that she is. She is asking for food. Will provider her ibuprofen for her knee pain and discussed with nursing that she can eat. [RR]    Clinical Course User Index [RR] Achille Rich, PA-C    Medical Decision Making Amount and/or Complexity of Data Reviewed Radiology: ordered.  Risk Prescription drug management.   47 y.o. female presents to the ER for evaluation of bilateral knee pain. Differential diagnosis includes but is not limited to trauma, MSK, fracture, dislocation, tendon/ligament injury, contusion. Vital signs elevated BP, otherwise unremarkable. Physical exam as noted above.   On previous chart evaluation, the patient was seen earlier today and was discharged.  According to staff, the patient stayed in the waiting room and checked back in.  XR imaging shows  *No acute osseous abnormality of bilateral knee joints. *Mild degenerative changes of bilateral knee joints predominantly involving the medial tibiofemoral compartment. Per radiologist's interpretation.    She is neurovascular intact  distally.  She has equal strength.  She does have some superficial abrasions that have already scabbed overlying the anterior bilateral knees.  Compartments are soft.  The patient has been ambulatory here with steady gait.  She is able to flex and extend both knees without issue.  I think she is likely having some MSK pain from her fall and the very superficial, scabbed abrasions seen to her bilateral knees. Ibuprofen and tetatnus shot ordered. Nursing note mentions A/D, however patient doesn't mention this. She denies any SI or HI. She reports that she came here for her knee pains and food.  She does have an odd affect but does have a history of bipolar 1.  She does not appear acutely psychotic.  Does not  appear to be responding to any internal stimuli.  Return precautions given and information for [facility] as well. Stable for discharge.   Portions of this report may have been transcribed using voice recognition software. Every effort was made to ensure accuracy; however, inadvertent computerized transcription errors may be present.    Final Clinical Impression(s) / ED Diagnoses Final diagnoses:  Acute pain of both knees    Rx / DC Orders ED Discharge Orders     None         Achille Rich, PA-C 07/28/23 0017    Gwyneth Sprout, MD 07/30/23 2146

## 2023-07-27 NOTE — ED Triage Notes (Signed)
Pt. Stated, Im here for anxiety and depression. I dont think they listen to me. I was discharged just a few ago.

## 2023-07-27 NOTE — ED Provider Notes (Signed)
Patient was seen and evaluated by earlier ED team due to concern for potential drug overdose.  This was reportedly accidental.  Patient was noted to be awake, alert, ambulatory, and back to baseline mental status at the time of signout.  She been observed nearly 10 hours in the ED.  Patient repeatedly requested to go outside to smoke, told she cannot do so while an active patient.  She was provided a cab voucher but RN confirmed she is NOT actively residing at [facility] in [place] as she initially reported.  She is homeless.    Given the significant cold weather outside tonight, we will anticipate likely discharge from the acute ED bed, but patient can stay in lobby until the morning and utilize her cab voucher or other printed homeless resources which were provided at discharge.   Terald Sleeper, MD 07/27/23 [phone]

## 2023-07-27 NOTE — Discharge Instructions (Addendum)
You were seen in the ER today for evaluation of your knee pain.  Your x-ray shows some arthritis but no evidence of any breaks.  You can take ibuprofen as needed for pain.  I have included information for [facility] if you change your mind, or having any anxiety or depression for you follow up with. If you have any concerns, new or worsening symptoms, please return to the ER for re-evaluation.   Contact a doctor if: The knee pain does not stop. The knee pain changes or gets worse. You have a fever along with knee pain. Your knee is red or feels warm when you touch it. Your knee gives out or locks up. Get help right away if: Your knee swells, and the swelling gets worse. You cannot move your knee. You have very bad knee pain that does not get better with pain medicine.

## 2023-07-28 ENCOUNTER — Encounter (HOSPITAL_COMMUNITY): Payer: Self-pay

## 2023-07-28 ENCOUNTER — Other Ambulatory Visit: Payer: Self-pay

## 2023-07-28 ENCOUNTER — Ambulatory Visit (HOSPITAL_COMMUNITY)
Admission: EM | Admit: 2023-07-28 | Discharge: 2023-07-28 | Disposition: A | Discharge status: Home / Self Care | Payer: BLUE CROSS/BLUE SHIELD

## 2023-07-28 ENCOUNTER — Emergency Department (HOSPITAL_COMMUNITY)
Admission: EM | Admit: 2023-07-28 | Discharge: 2023-07-28 | Disposition: A | Discharge status: Home / Self Care | Payer: BLUE CROSS/BLUE SHIELD | Attending: Emergency Medicine | Admitting: Emergency Medicine

## 2023-07-28 DIAGNOSIS — S80212A Abrasion, left knee, initial encounter: Secondary | ICD-10-CM | POA: Insufficient documentation

## 2023-07-28 DIAGNOSIS — F32A Depression, unspecified: Secondary | ICD-10-CM

## 2023-07-28 DIAGNOSIS — F1990 Other psychoactive substance use, unspecified, uncomplicated: Secondary | ICD-10-CM | POA: Diagnosis not present

## 2023-07-28 DIAGNOSIS — I10 Essential (primary) hypertension: Secondary | ICD-10-CM | POA: Insufficient documentation

## 2023-07-28 DIAGNOSIS — S8991XA Unspecified injury of right lower leg, initial encounter: Secondary | ICD-10-CM | POA: Diagnosis present

## 2023-07-28 DIAGNOSIS — W19XXXA Unspecified fall, initial encounter: Secondary | ICD-10-CM | POA: Diagnosis not present

## 2023-07-28 DIAGNOSIS — Z79899 Other long term (current) drug therapy: Secondary | ICD-10-CM | POA: Diagnosis not present

## 2023-07-28 DIAGNOSIS — S80211A Abrasion, right knee, initial encounter: Secondary | ICD-10-CM | POA: Insufficient documentation

## 2023-07-28 DIAGNOSIS — M25562 Pain in left knee: Secondary | ICD-10-CM

## 2023-07-28 LAB — COMPREHENSIVE METABOLIC PANEL
ALT: 35 U/L (ref 0–44)
AST: 47 U/L — ABNORMAL HIGH (ref 15–41)
Albumin: 3.1 g/dL — ABNORMAL LOW (ref 3.5–5.0)
Alkaline Phosphatase: 64 U/L (ref 38–126)
Anion gap: 10 (ref 5–15)
BUN: 10 mg/dL (ref 6–20)
CO2: 22 mmol/L (ref 22–32)
Calcium: 8.9 mg/dL (ref 8.9–10.3)
Chloride: 104 mmol/L (ref 98–111)
Creatinine, Ser: 0.98 mg/dL (ref 0.44–1.00)
GFR, Estimated: >60 mL/min (ref >60)
Glucose, Bld: 90 mg/dL (ref 70–99)
Potassium: 3.9 mmol/L (ref 3.5–5.1)
Sodium: 136 mmol/L (ref 135–145)
Total Bilirubin: 0.5 mg/dL (ref <1.2)
Total Protein: 7.3 g/dL (ref 6.5–8.1)

## 2023-07-28 LAB — RAPID URINE DRUG SCREEN, HOSP PERFORMED
Amphetamines: NOT DETECTED
Barbiturates: NOT DETECTED
Benzodiazepines: NOT DETECTED
Cocaine: NOT DETECTED
Opiates: NOT DETECTED
Tetrahydrocannabinol: NOT DETECTED

## 2023-07-28 LAB — CBC
HCT: 34.3 % — ABNORMAL LOW (ref 36.0–46.0)
Hemoglobin: 11.1 g/dL — ABNORMAL LOW (ref 12.0–15.0)
MCH: 31.5 pg (ref 26.0–34.0)
MCHC: 32.4 g/dL (ref 30.0–36.0)
MCV: 97.4 fL (ref 80.0–100.0)
Platelets: 216 10*3/uL (ref 150–400)
RBC: 3.52 MIL/uL — ABNORMAL LOW (ref 3.87–5.11)
RDW: 14.8 % (ref 11.5–15.5)
WBC: 5.7 10*3/uL (ref 4.0–10.5)
nRBC: 0 % (ref 0.0–0.2)

## 2023-07-28 LAB — ACETAMINOPHEN LEVEL
Acetaminophen (Tylenol), Serum: 23 ug/mL (ref 10–30)
Acetaminophen (Tylenol), Serum: <10 ug/mL — ABNORMAL LOW (ref 10–30)

## 2023-07-28 LAB — SALICYLATE LEVEL: Salicylate Lvl: <7 mg/dL — ABNORMAL LOW (ref 7.0–30.0)

## 2023-07-28 LAB — ETHANOL: Alcohol, Ethyl (B): <10 mg/dL (ref <10)

## 2023-07-28 LAB — HCG, SERUM, QUALITATIVE: Preg, Serum: NEGATIVE

## 2023-07-28 MED ORDER — METOPROLOL TARTRATE 25 MG PO TABS
25.0000 mg | ORAL_TABLET | Freq: Once | ORAL | Status: AC
  Administered 2023-07-28: 25 mg via ORAL
  Filled 2023-07-28: qty 1

## 2023-07-28 MED ORDER — IBUPROFEN 800 MG PO TABS
800.0000 mg | ORAL_TABLET | Freq: Once | ORAL | Status: AC
  Administered 2023-07-28: 800 mg via ORAL
  Filled 2023-07-28: qty 1

## 2023-07-28 NOTE — ED Provider Notes (Incomplete)
[place] EMERGENCY DEPARTMENT AT [facility] Provider Note   CSN: [id number] Arrival date & time: 07/27/23  0710     History Chief Complaint  Patient presents with  . Knee Pain    Judy Robles is a 47 y.o. female presents to the ER for evaluation of bilateral knee pain after a fall last night.    Knee Pain      Home Medications Prior to Admission medications   Medication Sig Start Date End Date Taking? Authorizing Provider  gabapentin (NEURONTIN) 100 MG capsule Take 1 capsule (100 mg total) by mouth 2 (two) times daily. 06/26/23 07/26/23  Karie Fetch, MD  gabapentin (NEURONTIN) 300 MG capsule Take 1 capsule (300 mg total) by mouth at bedtime. 06/26/23 07/26/23  Karie Fetch, MD  metoprolol tartrate (LOPRESSOR) 25 MG tablet Take 25 mg by mouth daily. 07/21/23   [provider]  mirtazapine (REMERON) 15 MG tablet Take 1 tablet (15 mg total) by mouth at bedtime. Patient not taking: Reported on 07/22/2023 06/26/23 07/26/23  Karie Fetch, MD      Allergies    Tylenol [acetaminophen], Lisinopril, and Mirtazapine    Review of Systems   Review of Systems  Physical Exam Updated Vital Signs BP (!) 159/73 (BP Location: Right Arm)   Pulse 78   Temp 98.3 F (36.8 C)   Resp 16   SpO2 100%  Physical Exam  ED Results / Procedures / Treatments   Labs (all labs ordered are listed, but only abnormal results are displayed) Labs Reviewed - No data to display  EKG None  Radiology DG Knee 3 Views Right  Result Date: 07/27/2023 CLINICAL DATA:  Fall.  Bilateral knee pain. EXAM: RIGHT KNEE - 3 VIEW; LEFT KNEE - 3 VIEW COMPARISON:  None Available. FINDINGS: No acute fracture or dislocation. No aggressive osseous lesion. There are degenerative changes of bilateral knee joints in the form of mildly reduced medial tibio-femoral compartment joint space and osteophytosis. No knee effusion or focal soft tissue swelling. No radiopaque foreign bodies. IMPRESSION:  *No acute osseous abnormality of bilateral knee joints. *Mild degenerative changes of bilateral knee joints predominantly involving the medial tibiofemoral compartment. Electronically Signed   By: Jules Schick M.D.   On: 07/27/2023 18:13   DG Knee 3 Views Left  Result Date: 07/27/2023 CLINICAL DATA:  Fall.  Bilateral knee pain. EXAM: RIGHT KNEE - 3 VIEW; LEFT KNEE - 3 VIEW COMPARISON:  None Available. FINDINGS: No acute fracture or dislocation. No aggressive osseous lesion. There are degenerative changes of bilateral knee joints in the form of mildly reduced medial tibio-femoral compartment joint space and osteophytosis. No knee effusion or focal soft tissue swelling. No radiopaque foreign bodies. IMPRESSION: *No acute osseous abnormality of bilateral knee joints. *Mild degenerative changes of bilateral knee joints predominantly involving the medial tibiofemoral compartment. Electronically Signed   By: Jules Schick M.D.   On: 07/27/2023 18:13   CT HEAD WO CONTRAST ( )  Result Date: 07/26/2023 CLINICAL DATA:  Mental status change.  Alcohol/drug use. EXAM: CT HEAD WITHOUT CONTRAST TECHNIQUE: Contiguous axial images were obtained from the base of the skull through the vertex without intravenous contrast. RADIATION DOSE REDUCTION: This exam was performed according to the departmental dose-optimization program which includes automated exposure control, adjustment of the mA and/or kV according to patient size and/or use of iterative reconstruction technique. COMPARISON:  06/20/2017 FINDINGS: Brain: The brain shows a normal appearance without evidence of malformation, atrophy, old or acute small or large vessel  infarction, mass lesion, hemorrhage, hydrocephalus or extra-axial collection. Vascular: No hyperdense vessel. No evidence of atherosclerotic calcification. Skull: Normal.  No traumatic finding.  No focal bone lesion. Sinuses/Orbits: Inflammatory changes of the paranasal sinuses, most advanced affecting  the maxillary sinuses. Orbits negative. Other: None significant IMPRESSION: 1. Normal appearance of the brain itself. 2. Inflammatory changes of the paranasal sinuses, most advanced affecting the maxillary sinuses. Electronically Signed   By: Paulina Fusi M.D.   On: 07/26/2023 17:24    Procedures Procedures   Medications Ordered in ED Medications - No data to display  ED Course/ Medical Decision Making/ A&P Clinical Course as of 07/27/23 2010  Thu Jul 27, 2023  1623 Evaluated the patient at bedside. She does not mention anxiety and depression. She mentions that she has bilateral knee pain from a fall last night. She was discharged earlier this morning and has stayed in the lobby since then.  [RR]  2007 I asked the patient if she was having any SI or HI. She denies that she is. She is asking for food. Will provider her ibuprofen for her knee pain and discussed with nursing that she can eat. [RR]    Clinical Course User Index [RR] Achille Rich, PA-C    Medical Decision Making Amount and/or Complexity of Data Reviewed Radiology: ordered.  Risk Prescription drug management.    47 y.o. female presents to the ER for evaluation of ***. Differential diagnosis includes but is not limited to ***. Vital signs ***. Physical exam as noted above.   XR imaging shows ***  The patient has been ambulatory here with steady gait. She r  ***Portions of this report may have been transcribed using voice recognition software. Every effort was made to ensure accuracy; however, inadvertent computerized transcription errors may be present.    Final Clinical Impression(s) / ED Diagnoses Final diagnoses:  None    Rx / DC Orders ED Discharge Orders     None

## 2023-07-28 NOTE — ED Provider Notes (Signed)
[place] EMERGENCY DEPARTMENT AT [facility] Provider Note   CSN: [id number] Arrival date & time: 07/28/23  0125     History  Chief Complaint  Patient presents with   Suicidal    Judy Robles is a 47 y.o. female.  47 y/o female with hx of obesity, HTN, HLD, OSA, bipolar 1 disorder, and homelessness presenting for ongoing knee pain. States she skinned her knees really bad from a fall. She was evaluated for this a few hours ago and given ibuprofen. Reports that the pain persists. No new trauma. Triage note references suicidal ideation; however, patient denies suicidality during my encounter with her. States she is not having thoughts of self harm. Has been seen at [facility] in the past, but reports it has been "a while".   The history is provided by the patient. No language interpreter was used.       Home Medications Prior to Admission medications   Medication Sig Start Date End Date Taking? Authorizing Provider  gabapentin (NEURONTIN) 100 MG capsule Take 1 capsule (100 mg total) by mouth 2 (two) times daily. 06/26/23 07/26/23  Karie Fetch, MD  gabapentin (NEURONTIN) 300 MG capsule Take 1 capsule (300 mg total) by mouth at bedtime. 06/26/23 07/26/23  Karie Fetch, MD  metoprolol tartrate (LOPRESSOR) 25 MG tablet Take 25 mg by mouth daily. 07/21/23   [provider]  mirtazapine (REMERON) 15 MG tablet Take 1 tablet (15 mg total) by mouth at bedtime. Patient not taking: Reported on 07/22/2023 06/26/23 07/26/23  Karie Fetch, MD      Allergies    Tylenol [acetaminophen], Lisinopril, and Mirtazapine    Review of Systems   Review of Systems Ten systems reviewed and are negative for acute change, except as noted in the HPI.    Physical Exam Updated Vital Signs BP (!) 189/84   Pulse 81   Temp 97.8 F (36.6 C) (Oral)   Resp 18   LMP  (LMP Unknown)   SpO2 100%   Physical Exam Vitals and nursing note reviewed.  Constitutional:      General: She is  not in acute distress.    Appearance: She is well-developed. She is not diaphoretic.     Comments: Disheveled appearing.  HENT:     Head: Normocephalic and atraumatic.  Eyes:     General: No scleral icterus.    Extraocular Movements: EOM normal.     Conjunctiva/sclera: Conjunctivae normal.  Pulmonary:     Effort: Pulmonary effort is normal. No respiratory distress.     Comments: Respirations even and unlabored Musculoskeletal:        General: Normal range of motion.     Cervical back: Normal range of motion.  Skin:    General: Skin is warm and dry.     Coloration: Skin is not pale.     Findings: No erythema or rash.     Comments: Superficial abrasions of knees with scabbing. No purulent drainage, heat to touch, erythema. Compartments of BLE soft, compressible.  Neurological:     Mental Status: She is alert and oriented to person, place, and time.  Psychiatric:        Mood and Affect: Mood and affect normal.        Behavior: Behavior normal.     ED Results / Procedures / Treatments   Labs (all labs ordered are listed, but only abnormal results are displayed) Labs Reviewed  COMPREHENSIVE METABOLIC PANEL - Abnormal; Notable for the following components:  Result Value   Albumin 3.1 (*)    AST 47 (*)    All other components within normal limits  SALICYLATE LEVEL - Abnormal; Notable for the following components:   Salicylate Lvl <7.0 (*)    All other components within normal limits  CBC - Abnormal; Notable for the following components:   RBC 3.52 (*)    Hemoglobin 11.1 (*)    HCT 34.3 (*)    All other components within normal limits  ACETAMINOPHEN LEVEL - Abnormal; Notable for the following components:   Acetaminophen (Tylenol), Serum <10 (*)    All other components within normal limits  ETHANOL  ACETAMINOPHEN LEVEL  RAPID URINE DRUG SCREEN, HOSP PERFORMED  HCG, SERUM, QUALITATIVE    EKG None  Radiology DG Knee 3 Views Right  Result Date: 07/27/2023 CLINICAL  DATA:  Fall.  Bilateral knee pain. EXAM: RIGHT KNEE - 3 VIEW; LEFT KNEE - 3 VIEW COMPARISON:  None Available. FINDINGS: No acute fracture or dislocation. No aggressive osseous lesion. There are degenerative changes of bilateral knee joints in the form of mildly reduced medial tibio-femoral compartment joint space and osteophytosis. No knee effusion or focal soft tissue swelling. No radiopaque foreign bodies. IMPRESSION: *No acute osseous abnormality of bilateral knee joints. *Mild degenerative changes of bilateral knee joints predominantly involving the medial tibiofemoral compartment. Electronically Signed   By: Jules Schick M.D.   On: 07/27/2023 18:13   DG Knee 3 Views Left  Result Date: 07/27/2023 CLINICAL DATA:  Fall.  Bilateral knee pain. EXAM: RIGHT KNEE - 3 VIEW; LEFT KNEE - 3 VIEW COMPARISON:  None Available. FINDINGS: No acute fracture or dislocation. No aggressive osseous lesion. There are degenerative changes of bilateral knee joints in the form of mildly reduced medial tibio-femoral compartment joint space and osteophytosis. No knee effusion or focal soft tissue swelling. No radiopaque foreign bodies. IMPRESSION: *No acute osseous abnormality of bilateral knee joints. *Mild degenerative changes of bilateral knee joints predominantly involving the medial tibiofemoral compartment. Electronically Signed   By: Jules Schick M.D.   On: 07/27/2023 18:13   CT HEAD WO CONTRAST ( )  Result Date: 07/26/2023 CLINICAL DATA:  Mental status change.  Alcohol/drug use. EXAM: CT HEAD WITHOUT CONTRAST TECHNIQUE: Contiguous axial images were obtained from the base of the skull through the vertex without intravenous contrast. RADIATION DOSE REDUCTION: This exam was performed according to the departmental dose-optimization program which includes automated exposure control, adjustment of the mA and/or kV according to patient size and/or use of iterative reconstruction technique. COMPARISON:  06/20/2017 FINDINGS:  Brain: The brain shows a normal appearance without evidence of malformation, atrophy, old or acute small or large vessel infarction, mass lesion, hemorrhage, hydrocephalus or extra-axial collection. Vascular: No hyperdense vessel. No evidence of atherosclerotic calcification. Skull: Normal.  No traumatic finding.  No focal bone lesion. Sinuses/Orbits: Inflammatory changes of the paranasal sinuses, most advanced affecting the maxillary sinuses. Orbits negative. Other: None significant IMPRESSION: 1. Normal appearance of the brain itself. 2. Inflammatory changes of the paranasal sinuses, most advanced affecting the maxillary sinuses. Electronically Signed   By: Paulina Fusi M.D.   On: 07/26/2023 17:24    Procedures Procedures    Medications Ordered in ED Medications  metoprolol tartrate (LOPRESSOR) tablet 25 mg (25 mg Oral Given 07/28/23 0557)  ibuprofen (ADVIL) tablet 800 mg (800 mg Oral Given 07/28/23 0631)    ED Course/ Medical Decision Making/ A&P Clinical Course as of 07/28/23 0704  Fri Jul 28, 2023  0541 Patient requesting  HTN medication. Will give ibuprofen for persistent pain complaints. Denies suicidal ideations during my encounter with her. Unclear reason for APAP elevation; patient denies tylenol use. Will repeat to ensure that it is downtrending prior to discharge. Patient to require information on [facility] for follow up. [KH]  0704 Repeat APAP negative. [KH]    Clinical Course User Index [KH] Antony Madura, PA-C                                 Medical Decision Making Amount and/or Complexity of Data Reviewed Labs: ordered.  Risk Prescription drug management.   This patient presents to the ED for concern of knee pain, this involves an extensive number of treatment options, and is a complaint that carries with it a high risk of complications and morbidity.  The differential diagnosis includes contusion vs fracture vs cellulitis vs septic joint.   Co morbidities that complicate the  patient evaluation  HTN HLD OSA Bipolar disorder   Additional history obtained:  External records from outside source obtained and reviewed including Xray L and R knee yesterday; negative for traumatic pathology.   Lab Tests:  I Ordered, and personally interpreted labs.  The pertinent results include:  Hgb 11.1 (stable). APAP 23 > less than 10.   Cardiac Monitoring:  The patient was maintained on a cardiac monitor.  I personally viewed and interpreted the cardiac monitored which showed an underlying rhythm of: NSR   Medicines ordered and prescription drug management:  I ordered medication including ibuprofen for pain; Metoprolol for HTN  Reevaluation of the patient after these medicines showed that the patient improved I have reviewed the patients home medicines and have made adjustments as needed   Problem List / ED Course:  As above   Reevaluation:  After the interventions noted above, I reevaluated the patient and found that they have :stayed the same   Social Determinants of Health:  Homeless    Dispostion:  After consideration of the diagnostic results and the patients response to treatment, I feel that the patent would benefit from outpatient symptom control of knee pain as well as basic wound care. Given information for [facility] should she desire evaluation of her depression.          Final Clinical Impression(s) / ED Diagnoses Final diagnoses:  Acute pain of both knees  Depression, unspecified depression type    Rx / DC Orders ED Discharge Orders     None         Antony Madura, PA-C 07/28/23 0981    Zadie Rhine, MD 07/28/23 2304

## 2023-07-28 NOTE — Discharge Instructions (Addendum)
Discharge recommendations:   Outpatient Follow up: Please follow up with [facility] in [place] for medication management, counseling and substance abuse treatment.   If you are a resident of [facility], you may follow up at the [facility] for medication management and counseling.   You are encouraged to follow up with [facility] for outpatient treatment.  Walk in/ Open Access Hours: Monday - Friday 8AM - 11AM (To see provider and therapist) Friday - 1PM - 4PM (To see therapist only)  [facility] [address] [place], [place] [phone]  Therapy: We recommend that patient participate in individual therapy to address mental health concerns.  Safety:   The following safety precautions should be taken:   No sharp objects. This includes scissors, razors, scrapers, and putty knives.   Chemicals should be removed and locked up.   Medications should be removed and locked up.   Weapons should be removed and locked up. This includes firearms, knives and instruments that can be used to cause injury.   The patient should abstain from use of illicit substances/drugs and abuse of any medications.  If symptoms worsen or do not continue to improve or if the patient becomes actively suicidal or homicidal then it is recommended that the patient return to the closest hospital emergency department, the [facility], or call 911 for further evaluation and treatment. National Suicide Prevention Lifeline 1-800-SUICIDE or 503-148-0328.  About 988 988 offers 24/7 access to trained crisis counselors who can help people experiencing mental health-related distress. People can call or text 988 or chat 988lifeline.org for themselves or if they are worried about a loved one who may need crisis support.   Based on the information that you have provided and the presenting issues outpatient  services and resources for have been recommended. It is imperative that you follow through with treatment recommendations within 5-7 days from the of discharge to mitigate further risk to your safety and mental well-being. A list of referrals has been provided below to get you started. You are not limited to the list provided.  In case of an urgent crisis, you may contact the Mobile Crisis Unit with [facility] at 1.262-821-2033.  [facility]          -(Please Call) Phone: [phone]   Provides housing and specialized case management focused on housing stability.    [facility] NIGHT SHELTER) [address] [place], [place] Phone: [phone]   [facility] has been providing emergency shelter to those in need of a permanent residence for over 35 years. The [employer] shelter plays an important role in our community.   There are many life events that can pull someone into a downward spiral towards poverty that is very difficult to get out of. Homelessness is a problem that can affect anyone of [place]. [employer] is a safe and comforting place to stay, especially if you have experienced the hardship of street life.   [employer] [facility] provides a single bed and bedding to 100 adult men and women. The shelter welcomes all who are in need of housing, no one in real need is turned away unless space is not available.   While staying at [facility], guests are offered more than just a bed for a night. Hot meals are provided and every guest has access to case management services. Case managers provide assistance with finding housing,  employment, or other services that will help them gain stability. Continuous stay is based on availability, capacity, and progress towards goals.   To contact the front desk of [facility] please call   [phone] ext [phone] or ext. [phone].   [facility] [address] [place], [place] [address] Phone: [phone]   [facility] (Women only) [address] [place], [place] [address] Phone: [phone]   [facility] Address: [address], [place], [place] [address] Hours:   Opens 9?AM Mon Phone: [phone]  The [facility] providers a continuum of housing services to those experiencing homelessness. They provide transitional [employer] and permanent supportive housing ([place]) to disabled veterans experiencing homelessness. There is a fast track Rapid Rehousing program couples housing stability services with temporary financial assistance to quickly house individuals and families experiencing homelessness.   [employer] [address], [place] [address] Phone: [phone]   [facility] [place] [address] [place], [place] [address] Phone: [phone]   [facility] [address] [place], [place] [address] Check in at 6:00PM for placement at a local shelter) Phone: [phone]   [facility] Eligibility:  Must be drug and alcohol free for at least 14 days or more at the time of application. This program serves males.  Houses Engineer, civil (consulting), Economist who serve six-month terms.  Houses are financially self-supporting; members split house expenses, which average $90.00 to $130.00 per person per week.  Any Resident who relapses must be immediately expelled. Call:  [phone]   [facility]  Address: [address] [place], [place] [address]  Phone#: [phone] [facility] Address: [address] [place], [place] [address] Phone: [phone]  -[facility] provides food, shelter and other programs and services to the homeless men of [place] through our [employer] program.  By offering safe shelter, three meals  a day, clean clothing, Biblical counseling, financial planning, vocational training, GED/education and employment assistance, we've helped mend the shattered lives of many homeless men since opening in [place].  We have approximately 267 beds available, with a max of 312 beds including mats for emergency situations and currently house an average of 270 men a night.  Prospective Client Check-In Information Photo ID Required (State/ Out of State/ [facility]) - if photo ID is not available, clients are required to have a printout of a police/sheriff's criminal history report. Help out with chores around the [facility]. No sex offender of any type (pending, charged, registered and/or any other sex related offenses) will be permitted to check in. Must be willing to abide by all rules, regulations, and policies established by the [employer]. The following will be provided - shelter, food, clothing, and biblical counseling. If you or someone you know is in need of assistance at our [facility] shelter in [place], [place], please call [phone] ext. [phone].  Women Shelter for [employer] hours are Monday-Friday only.

## 2023-07-28 NOTE — Discharge Instructions (Addendum)
Take tylenol or ibuprofen for pain; use as instructed on the box/bottle. Follow up with [facility] if you have ongoing concerns about your depression.

## 2023-07-28 NOTE — ED Notes (Addendum)
Pt belongings placed in locker 11 One shirt and one bus pass in bag

## 2023-07-28 NOTE — Progress Notes (Signed)
   07/28/23 1003  BHUC Triage Screening (Walk-ins at [facility] only)  How Did You Hear About [place]? Self  What Is the Reason for Your Visit/Call Today? Ra Clendaniel presents to [facility] unaccompanied. Pt reports that she came from [facility] hospital this morning for ongoing blood pressure issues. Pt reports that she felt terrible so she came here to be evaluated. Pt mentions that she has been experiencing anxiety and depression throughout her whole life. Pt mentions that she has a hx of alcohol use, but has not drank since earlier this week. Pt is currently taking Gaba and blood pressure meds at this time. Pt is homeless and looking for a place to stay. Pt is also looking to be admitted into the hospital in seeking treatment for depression. Pt denies subsatnce use, SI, HI, AVH.  How Long Has This Been Causing You Problems? > than 6 months  Have You Recently Had Any Thoughts About Hurting Yourself? No  Are You Planning to Commit Suicide/Harm Yourself At This time? No  Have you Recently Had Thoughts About Hurting Someone Karolee Ohs? No  Are You Planning To Harm Someone At This Time? No  Physical Abuse Denies  Verbal Abuse Denies  Sexual Abuse Denies  Exploitation of patient/patient's resources Denies  Self-Neglect Denies  Possible abuse reported to: Other (Comment)  Are you currently experiencing any auditory, visual or other hallucinations? No  Have You Used Any Alcohol or Drugs in the Past 24 Hours? No  Do you have any current medical co-morbidities that require immediate attention? No  Clinician description of patient physical appearance/behavior: calm, tearful, cooperative  What Do You Feel Would Help You the Most Today? Medication(s)  If access to [facility] was not available, would you have sought care in the Emergency Department? No  Determination of Need Routine (7 days)  Options For Referral Medication Management

## 2023-07-28 NOTE — ED Provider Notes (Signed)
Behavioral Health Urgent Care Medical Screening Exam  Patient Name: Judy Robles MRN: [id number] Date of Evaluation: 07/28/23 Chief Complaint:  experiencing depression and anxiety. Diagnosis:  Final diagnoses:  Depression, unspecified depression type  Substance use    History of Present illness: Judy Robles is a 47 y.o. female patient with a past psychiatric history significant for MDD, bipolar 2, polysubstance abuse (alcohol use disorder, opioid use disorder) and PTSD who presents to the [facility] behavioral health urgent care voluntary with complaints of experiencing depression and anxiety.  Patient seen and evaluated face-to-face by this provider, chart reviewed and case with Dr. Clovis Riley.  On evaluation, patient is alert and oriented x 4. Her thought process is linear and speech is clear and coherent. Her mood is depressed and affect is congruent. She appears disheveled. She is calm and cooperative and does not appear to be in acute distress. She denies SI/HI/AVH. There is no objective evidence that the patient is currently responding to internal or external stimuli.  Patient states that her son was murdered in January 2020 and that his birthday is approaching on Christmas eve along with his death anniversary which has made her feel more depressed. She describes her depressive symptoms as sadness, some crying, and difficulty sleeping.   She reports a long history of anxiety and panic attacks but is unable to describe recent anxiety symptoms and does not report worsening anxiety symptoms.   She reports a history of abusing oxycodone intermittently for the past 10 years. She reports getting oxycodone "from a trusted friend" and states that she uses whenever she can get it. She reports last using oxycodone two days ago. Patient's UDS on 07/28/23 and 12/424 was negative while in the ED.   She reports drinking alcohol since age 17. She reports drinking "a little of everything." She  reports last consuming alcohol two days ago. She is unable to quantify how much she consumed. She reports mild withdrawal symptoms of nausea, chills and shakes. BAL on 12/6 and 12/4 at the ED was negative.   Patient states that she is homeless and was living with a friend  name Sue Lush in [place]. She states that prior to that she was living at the [facility] which appears to be a women's shelter in [place]. Patient is unemployed. Patient reports an upcoming court date possibly next month for trespassing. Patient denies outpatient psychiatry or counseling. Patient denies taking psychotropic medications for depression or anxiety.   Plan of care: Case discussed with Dr. Clovis Riley. Patient does not meet criteria for [facility] alcohol/opioid detox. Patient recommended to follow up with outpatient psychiatry for medication management, therapy and substance abuse treatment. Patient requested for Ava, care manager to contact [facility] to see if she can return and if they could pick her up today. Ava, agreeable to contacting [facility] on the patient's behalf. Will also provide patient with community resources for local shelters.   Per Ava, "Writer spoke to the intake worker, Forensic scientist at [facility] in [place] [phone]). Per Vianne Bulls, the patient is no longer a resident at their facility and so is not able to return. LaShone reports that she spoke to the patient earlier this morning and informed her that she is not able to return to the faciltiy."   Flowsheet Row ED from 07/28/2023 in [facility] Most recent reading at 07/28/2023 10:13 AM ED from 07/28/2023 in [facility] Emergency Department at [facility] Most recent reading at 07/28/2023  2:33  AM ED from 07/27/2023 in [facility] Emergency Department at [facility] Most recent reading at 07/27/2023  7:39 AM  C-SSRS RISK CATEGORY Error: Q3, 4, or 5 should not be populated when Q2 is No Low Risk No Risk        Psychiatric Specialty Exam  Presentation  General Appearance:Disheveled  Eye Contact:Fair  Speech:Clear and Coherent  Speech Volume:Decreased  Handedness:Right   Mood and Affect  Mood: Depressed  Affect: Congruent   Thought Process  Thought Processes: Coherent  Descriptions of Associations:Intact  Orientation:Full (Time, Place and Person)  Thought Content:Logical    Hallucinations:None  Ideas of Reference:None  Suicidal Thoughts:No  Homicidal Thoughts:No   Sensorium  Memory: Immediate Fair; Remote Fair; Recent Fair  Judgment: Fair  Insight: Fair   Art therapist  Concentration: Fair  Attention Span: Fair  Recall: [employer] of Knowledge: Fair  Language: Fair   Psychomotor Activity  Psychomotor Activity: Normal   Assets  Assets: Manufacturing systems engineer; Desire for Improvement; Resilience   Sleep  Sleep: Poor     Physical Exam: Physical Exam HENT:     Nose: Nose normal.  Cardiovascular:     Rate and Rhythm: Normal rate.  Pulmonary:     Effort: Pulmonary effort is normal.  Musculoskeletal:     Cervical back: Normal range of motion.  Skin:    Comments: Generalized abrasions, face, and upper and lower extremities   Neurological:     Mental Status: She is alert.    Review of Systems  Constitutional: Negative.   HENT: Negative.    Eyes: Negative.   Respiratory: Negative.    Cardiovascular: Negative.   Gastrointestinal: Negative.   Genitourinary: Negative.   Musculoskeletal: Negative.   Neurological: Negative.   Endo/Heme/Allergies: Negative.    Blood pressure 134/74, pulse 89, temperature 98.7 F (37.1 C), temperature source Oral, resp. rate 20, SpO2 100%. There is no height or weight on file to calculate BMI.  Musculoskeletal: Strength & Muscle Tone: within normal limits Gait & Station: normal Patient leans: N/A   BH[facility] MSE Discharge Disposition for Follow up and Recommendations: Based on my  evaluation the patient does not appear to have an emergency medical condition and can be discharged with resources and follow up care in outpatient services for Medication Management, Individual Therapy, and Group Therapy  Discharge recommendations:   Outpatient Follow up: Please follow up with [facility] in [place] for medication management, counseling and substance abuse treatment.   If you are a resident of [facility], you may follow up at the [facility] for medication management and counseling.   You are encouraged to follow up with [facility] for outpatient treatment.  Walk in/ Open Access Hours: Monday - Friday 8AM - 11AM (To see provider and therapist) Friday - 1PM - 4PM (To see therapist only)  [facility] [address] [place], [place] [phone]  Therapy: We recommend that patient participate in individual therapy to address mental health concerns.  Safety:   The following safety precautions should be taken:   No sharp objects. This includes scissors, razors, scrapers, and putty knives.   Chemicals should be removed and locked up.   Medications should be removed and locked up.   Weapons should be removed and locked up. This includes firearms, knives and instruments that can be used to cause injury.   The patient should abstain from use of illicit substances/drugs and abuse of any medications.  If symptoms worsen  or do not continue to improve or if the patient becomes actively suicidal or homicidal then it is recommended that the patient return to the closest hospital emergency department, the [facility], or call 911 for further evaluation and treatment. National Suicide Prevention Lifeline 1-800-SUICIDE or (423) 441-7709.  About 988 988 offers 24/7 access to trained crisis counselors who can help people experiencing mental health-related distress.  People can call or text 988 or chat 988lifeline.org for themselves or if they are worried about a loved one who may need crisis support.   Based on the information that you have provided and the presenting issues outpatient services and resources for have been recommended. It is imperative that you follow through with treatment recommendations within 5-7 days from the of discharge to mitigate further risk to your safety and mental well-being. A list of referrals has been provided below to get you started. You are not limited to the list provided.  In case of an urgent crisis, you may contact the Mobile Crisis Unit with [facility] at [phone].  [facility]          -(Please Call) Phone: [phone]   Provides housing and specialized case management focused on housing stability.    [facility] NIGHT [facility]LTER) [address] [place], [place] Phone: [phone]   [facility] has been providing emergency shelter to those in need of a permanent residence for over 35 years. The [employer] shelter plays an important role in our community.   There are many life events that can pull someone into a downward spiral towards poverty that is very difficult to get out of. Homelessness is a problem that can affect anyone of [place]. [employer] [facility] is a safe and comforting place to stay, especially if you have experienced the hardship of street life.   [facility] provides a single bed and bedding to 100 adult men and women. The shelter welcomes all who are in need of housing, no one in real need is turned away unless space is not available.   While staying at [facility], guests are offered more than just a bed for a night. Hot meals are provided and every guest has access to case management services. Case managers provide assistance with finding housing, employment, or other services that will help them gain  stability. Continuous stay is based on availability, capacity, and progress towards goals.   To contact the front desk of [facility] please call   [phone] ext [phone] or ext. [phone].   [facility] [address], [place], [place] [address] Phone: [phone]   [facility] (Women only) [address] [place], [place] [address] Phone: [phone]   [facility] Address: [address], [place], [place] [address] Hours:   Opens 9?AM Mon Phone: [phone]  The [facility] providers a continuum of housing services to those experiencing homelessness. They provide transitional [employer] and permanent supportive housing ([employer]) to disabled veterans experiencing homelessness. There is a fast track Rapid Rehousing program couples housing stability services with temporary financial assistance to quickly house individuals and families experiencing homelessness.   [employer] [address], [place] [address] Phone: [phone]   [facility] [address] [place], [place] [address] Phone: [phone]   [facility] [address] [place], [place] [address] Check in  at 6:00PM for placement at a local shelter) Phone: [phone]   [facility] Eligibility:  Must be drug and alcohol free for at least 14 days or more at the time of application. This program serves males.  Houses Engineer, civil (consulting), Economist who serve six-month terms.  Houses are financially self-supporting; members split house expenses, which average $90.00 to $130.00 per person per week.  Any Resident who relapses must be immediately expelled. Call:  [phone]   [facility]  Address: [address] [place], [place] [address]  Phone#: [phone] [facility] Address: [address] [place],  [place] [address] Phone: [phone]  -The [facility] provides food, shelter and other programs and services to the homeless men of [place] through our [employer] program.  By offering safe shelter, three meals a day, clean clothing, Biblical counseling, financial planning, vocational training, GED/education and employment assistance, we've helped mend the shattered lives of many homeless men since opening in [place].  We have approximately 267 beds available, with a max of 312 beds including mats for emergency situations and currently house an average of 270 men a night.  Prospective Client Check-In Information Photo ID Required (State/ Out of State/ [facility]) - if photo ID is not available, clients are required to have a printout of a police/sheriff's criminal history report. Help out with chores around the [facility]. No sex offender of any type (pending, charged, registered and/or any other sex related offenses) will be permitted to check in. Must be willing to abide by all rules, regulations, and policies established by the [employer]. The following will be provided - shelter, food, clothing, and biblical counseling. If you or someone you know is in need of assistance at our [facility] shelter in [place], [place], please call [phone] ext. [phone].  Women Shelter for [employer] hours are Monday-Friday only.     Kimberley Dastrup L, NP 07/28/2023, 11:44 AM

## 2023-07-28 NOTE — ED Triage Notes (Signed)
Pt states that she is suicidal. Pt states that she does not want to live, but has no plan on how to kill herself.

## 2023-07-31 ENCOUNTER — Encounter (HOSPITAL_COMMUNITY): Payer: Self-pay

## 2023-07-31 ENCOUNTER — Emergency Department (HOSPITAL_COMMUNITY)
Admission: EM | Admit: 2023-07-31 | Discharge: 2023-07-31 | Disposition: A | Discharge status: Home / Self Care | Payer: BLUE CROSS/BLUE SHIELD | Attending: Emergency Medicine | Admitting: Emergency Medicine

## 2023-07-31 DIAGNOSIS — S80212A Abrasion, left knee, initial encounter: Secondary | ICD-10-CM | POA: Insufficient documentation

## 2023-07-31 DIAGNOSIS — M25561 Pain in right knee: Secondary | ICD-10-CM | POA: Diagnosis present

## 2023-07-31 DIAGNOSIS — S80211A Abrasion, right knee, initial encounter: Secondary | ICD-10-CM | POA: Insufficient documentation

## 2023-07-31 DIAGNOSIS — S80219D Abrasion, unspecified knee, subsequent encounter: Secondary | ICD-10-CM

## 2023-07-31 DIAGNOSIS — W19XXXA Unspecified fall, initial encounter: Secondary | ICD-10-CM | POA: Diagnosis not present

## 2023-07-31 MED ORDER — CEPHALEXIN 250 MG PO CAPS
1000.0000 mg | ORAL_CAPSULE | Freq: Once | ORAL | Status: AC
  Administered 2023-07-31: 1000 mg via ORAL
  Filled 2023-07-31: qty 4

## 2023-07-31 MED ORDER — IBUPROFEN 800 MG PO TABS
800.0000 mg | ORAL_TABLET | Freq: Once | ORAL | Status: AC
  Administered 2023-07-31: 800 mg via ORAL
  Filled 2023-07-31: qty 1

## 2023-07-31 MED ORDER — CEPHALEXIN 500 MG PO CAPS: ORAL_CAPSULE | ORAL | 0 refills | Status: DC

## 2023-07-31 NOTE — ED Triage Notes (Signed)
Bilateral knee pain since last week after a fall. Wounds to bilateral knee observed.

## 2023-07-31 NOTE — Discharge Instructions (Addendum)
I am sorry that he skinned your knees.  Your x-ray from a few days ago does not show any broken bones.  Typically we want you to try and keep these clean.  The people to get a lots of money for wound care are using Vaseline a couple times a day and wrapping the area.  I am not sure that they are infected but if you are concerned I did prescribe you some antibiotics for this.  Please follow-up with your doctor in the office.  If you do not have a doctor of given information for a provider that is willing to see just about everyone.  There is also information in this paperwork to call hotline number where somebody can try and help you with this.

## 2023-07-31 NOTE — ED Provider Notes (Signed)
[place] EMERGENCY DEPARTMENT AT [facility] Provider Note   CSN: [id number] Arrival date & time: 07/31/23  4098     History  Chief Complaint  Patient presents with   Knee Pain    Judy Robles is a 47 y.o. female.  47 yo F with a chief complaints of bilateral skinned knees.  This happened a few days ago.  She had fallen.  Has been seen in the ED now multiple times for the same.  She is worried that they are infected.  Having worsening pain to the area.  No fevers.   Knee Pain      Home Medications Prior to Admission medications   Medication Sig Start Date End Date Taking? Authorizing Provider  cephALEXin (KEFLEX) 500 MG capsule 2 caps po bid x 7 days 07/31/23  Yes Melene Plan, DO  gabapentin (NEURONTIN) 100 MG capsule Take 1 capsule (100 mg total) by mouth 2 (two) times daily. 06/26/23 07/26/23  Karie Fetch, MD  gabapentin (NEURONTIN) 300 MG capsule Take 1 capsule (300 mg total) by mouth at bedtime. 06/26/23 07/26/23  Karie Fetch, MD  metoprolol tartrate (LOPRESSOR) 25 MG tablet Take 25 mg by mouth daily. 07/21/23   [provider]  mirtazapine (REMERON) 15 MG tablet Take 1 tablet (15 mg total) by mouth at bedtime. Patient not taking: Reported on 07/22/2023 06/26/23 07/26/23  Karie Fetch, MD      Allergies    Tylenol [acetaminophen], Lisinopril, and Mirtazapine    Review of Systems   Review of Systems  Physical Exam Updated Vital Signs BP (!) 178/85   Pulse 85   Temp 98.1 F (36.7 C) (Oral)   Resp 15   Ht 5\' 1"  (1.549 m)   Wt 127 kg   LMP  (LMP Unknown)   SpO2 100%   BMI 52.90 kg/m  Physical Exam Vitals and nursing note reviewed.  Constitutional:      General: She is not in acute distress.    Appearance: She is well-developed. She is not diaphoretic.  HENT:     Head: Normocephalic and atraumatic.  Eyes:     Pupils: Pupils are equal, round, and reactive to light.  Cardiovascular:     Rate and Rhythm: Normal rate and regular  rhythm.     Heart sounds: No murmur heard.    No friction rub. No gallop.  Pulmonary:     Effort: Pulmonary effort is normal.     Breath sounds: No wheezing or rales.  Abdominal:     General: There is no distension.     Palpations: Abdomen is soft.     Tenderness: There is no abdominal tenderness.  Musculoskeletal:        General: No tenderness.     Cervical back: Normal range of motion and neck supple.  Skin:    General: Skin is warm and dry.     Comments: Abrasion to bilateral knees with very mild surrounding erythema.  I do not appreciate any fluctuance or induration.  There is no drainage.  Neurological:     Mental Status: She is alert and oriented to person, place, and time.  Psychiatric:        Behavior: Behavior normal.     ED Results / Procedures / Treatments   Labs (all labs ordered are listed, but only abnormal results are displayed) Labs Reviewed - No data to display  EKG None  Radiology No results found.  Procedures Procedures    Medications Ordered in ED  Medications  ibuprofen (ADVIL) tablet 800 mg (800 mg Oral Given 07/31/23 1013)  cephALEXin (KEFLEX) capsule 1,000 mg (1,000 mg Oral Given 07/31/23 1013)    ED Course/ Medical Decision Making/ A&P                                 Medical Decision Making Risk Prescription drug management.   47 yo F with a chief complaints of bilateral skinned knees.  This is now her fourth visit for this.  On my record review she has had plain films performed already that were negative.  I independently interpreted these x-rays also was negative.  She is reportedly homeless and has no planned follow-up.  Given information for [facility].  Will start on oral antibiotics though I am not convinced that she has cellulitis.  Will have her follow-up with her family doctor in the office.  12:50 PM:  I have discussed the diagnosis/risks/treatment options with the patient.  Evaluation and diagnostic testing in the  emergency department does not suggest an emergent condition requiring admission or immediate intervention beyond what has been performed at this time.  They will follow up with PCP. We also discussed returning to the ED immediately if new or worsening sx occur. We discussed the sx which are most concerning (e.g., sudden worsening pain, fever, inability to tolerate by mouth, rapid spreading redness) that necessitate immediate return. Medications administered to the patient during their visit and any new prescriptions provided to the patient are listed below.  Medications given during this visit Medications  ibuprofen (ADVIL) tablet 800 mg (800 mg Oral Given 07/31/23 1013)  cephALEXin (KEFLEX) capsule 1,000 mg (1,000 mg Oral Given 07/31/23 1013)     The patient appears reasonably screen and/or stabilized for discharge and I doubt any other medical condition or other [facility] requiring further screening, evaluation, or treatment in the ED at this time prior to discharge.          Final Clinical Impression(s) / ED Diagnoses Final diagnoses:  Abrasion of knee, unspecified laterality, subsequent encounter    Rx / DC Orders ED Discharge Orders          Ordered    cephALEXin (KEFLEX) 500 MG capsule        07/31/23 1005              Melene Plan, DO 07/31/23 1250

## 2023-08-02 ENCOUNTER — Other Ambulatory Visit: Payer: Self-pay

## 2023-08-02 ENCOUNTER — Encounter (HOSPITAL_COMMUNITY): Payer: Self-pay

## 2023-08-02 ENCOUNTER — Emergency Department (HOSPITAL_COMMUNITY)
Admission: EM | Admit: 2023-08-02 | Discharge: 2023-08-03 | Disposition: A | Discharge status: Home / Self Care | Payer: BLUE CROSS/BLUE SHIELD | Attending: Emergency Medicine | Admitting: Emergency Medicine

## 2023-08-02 ENCOUNTER — Emergency Department (HOSPITAL_COMMUNITY)
Admission: EM | Admit: 2023-08-02 | Discharge: 2023-08-02 | Disposition: A | Discharge status: Home / Self Care | Payer: BLUE CROSS/BLUE SHIELD | Attending: Emergency Medicine | Admitting: Emergency Medicine

## 2023-08-02 DIAGNOSIS — S80211A Abrasion, right knee, initial encounter: Secondary | ICD-10-CM | POA: Diagnosis not present

## 2023-08-02 DIAGNOSIS — M25562 Pain in left knee: Secondary | ICD-10-CM

## 2023-08-02 DIAGNOSIS — S80212A Abrasion, left knee, initial encounter: Secondary | ICD-10-CM | POA: Diagnosis not present

## 2023-08-02 DIAGNOSIS — I10 Essential (primary) hypertension: Secondary | ICD-10-CM | POA: Diagnosis not present

## 2023-08-02 DIAGNOSIS — Z5900 Homelessness unspecified: Secondary | ICD-10-CM | POA: Insufficient documentation

## 2023-08-02 DIAGNOSIS — M25561 Pain in right knee: Secondary | ICD-10-CM | POA: Diagnosis present

## 2023-08-02 DIAGNOSIS — T730XXA Starvation, initial encounter: Secondary | ICD-10-CM | POA: Insufficient documentation

## 2023-08-02 DIAGNOSIS — X58XXXA Exposure to other specified factors, initial encounter: Secondary | ICD-10-CM | POA: Insufficient documentation

## 2023-08-02 MED ORDER — OXYCODONE HCL 5 MG PO TABS
5.0000 mg | ORAL_TABLET | Freq: Once | ORAL | Status: AC
  Administered 2023-08-02: 5 mg via ORAL
  Filled 2023-08-02: qty 1

## 2023-08-02 MED ORDER — CYCLOBENZAPRINE HCL 10 MG PO TABS: 10.0000 mg | ORAL_TABLET | Freq: Two times a day (BID) | ORAL | 0 refills | Status: AC | PRN

## 2023-08-02 MED ORDER — ONDANSETRON 4 MG PO TBDP
4.0000 mg | ORAL_TABLET | Freq: Once | ORAL | Status: AC
  Administered 2023-08-02: 4 mg via ORAL
  Filled 2023-08-02: qty 1

## 2023-08-02 MED ORDER — ONDANSETRON 4 MG PO TBDP: 4.0000 mg | ORAL_TABLET | Freq: Three times a day (TID) | ORAL | 0 refills | Status: DC | PRN

## 2023-08-02 MED ORDER — IBUPROFEN 800 MG PO TABS
800.0000 mg | ORAL_TABLET | Freq: Once | ORAL | Status: AC
  Administered 2023-08-02: 800 mg via ORAL
  Filled 2023-08-02: qty 1

## 2023-08-02 NOTE — ED Provider Notes (Signed)
[place] EMERGENCY DEPARTMENT AT [facility] Provider Note   CSN: [id number] Arrival date & time: 08/02/23  1555     History  Chief Complaint  Patient presents with   Leg Pain    Judy Robles is a 47 y.o. female.  With history of bipolar disorder, anxiety, depression, hypertension presenting for evaluation of bilateral knee pain.  Symptoms have been present for the past week or so.  Pain is circumferential and encompasses both knees.  No calf pain.  She notes to me that she denies any falls or trauma, however on chart review this is her fourth visit in 6 days for the same.  Has had each previous time that she fell and skinned her knees.   Leg Pain      Home Medications Prior to Admission medications   Medication Sig Start Date End Date Taking? Authorizing Provider  cyclobenzaprine (FLEXERIL) 10 MG tablet Take 1 tablet (10 mg total) by mouth 2 (two) times daily as needed for up to 7 days for muscle spasms. 08/02/23 08/09/23 Yes Cristle Jared, Edsel Petrin, PA-C  ondansetron (ZOFRAN-ODT) 4 MG disintegrating tablet Take 1 tablet (4 mg total) by mouth every 8 (eight) hours as needed for nausea or vomiting. 08/02/23  Yes Rayan Dyal, Edsel Petrin, PA-C  cephALEXin (KEFLEX) 500 MG capsule 2 caps po bid x 7 days 07/31/23   Melene Plan, DO  gabapentin (NEURONTIN) 100 MG capsule Take 1 capsule (100 mg total) by mouth 2 (two) times daily. 06/26/23 07/26/23  Karie Fetch, MD  gabapentin (NEURONTIN) 300 MG capsule Take 1 capsule (300 mg total) by mouth at bedtime. 06/26/23 07/26/23  Karie Fetch, MD  metoprolol tartrate (LOPRESSOR) 25 MG tablet Take 25 mg by mouth daily. 07/21/23   [provider]  mirtazapine (REMERON) 15 MG tablet Take 1 tablet (15 mg total) by mouth at bedtime. Patient not taking: Reported on 07/22/2023 06/26/23 07/26/23  Karie Fetch, MD      Allergies    Tylenol [acetaminophen], Lisinopril, and Mirtazapine    Review of Systems   Review of  Systems  Physical Exam Updated Vital Signs BP (!) 152/112   Pulse 78   Temp 98.7 F (37.1 C) (Oral)   Resp 17   Ht 5\' 1"  (1.549 m)   Wt 127 kg   LMP  (LMP Unknown)   SpO2 100%   BMI 52.90 kg/m  Physical Exam Vitals and nursing note reviewed.  Constitutional:      General: She is not in acute distress.    Appearance: Normal appearance. She is normal weight. She is not ill-appearing.     Comments: Disheveled appearance  HENT:     Head: Normocephalic and atraumatic.  Pulmonary:     Effort: Pulmonary effort is normal. No respiratory distress.  Abdominal:     General: Abdomen is flat.  Musculoskeletal:        General: Normal range of motion.     Cervical back: Neck supple.     Comments: 8 cm x 10 cm area of abrasion on right knee, 10 cm x 12 cm area of abrasion to the left knee, with granulation tissue on both abrasions.  No purulent discharge.  Mild surrounding erythema.  No fluctuance or induration.  No calf erythema bilaterally.  Compartments are soft.  DP pulses 2+ bilaterally.  Skin:    General: Skin is warm and dry.  Neurological:     Mental Status: She is alert and oriented to person, place, and time.  Psychiatric:        Mood and Affect: Mood normal.        Behavior: Behavior normal.     ED Results / Procedures / Treatments   Labs (all labs ordered are listed, but only abnormal results are displayed) Labs Reviewed - No data to display  EKG None  Radiology No results found.  Procedures Procedures    Medications Ordered in ED Medications  oxyCODONE (Oxy IR/ROXICODONE) immediate release tablet 5 mg (5 mg Oral Given 08/02/23 1659)  ondansetron (ZOFRAN-ODT) disintegrating tablet 4 mg (4 mg Oral Given 08/02/23 1659)  ibuprofen (ADVIL) tablet 800 mg (800 mg Oral Given 08/02/23 1849)    ED Course/ Medical Decision Making/ A&P                                 Medical Decision Making Risk Prescription drug management.  This patient presents to the ED for  concern of bilateral knee pain, this involves an extensive number of treatment options, and is a complaint that carries with it a high risk of complications and morbidity.  The differential diagnosis includes fracture, strain, sprain, contusion, dislocation, abrasion, cellulitis, DVT  My initial workup includes symptom control  Additional history obtained from: Nursing notes from this visit. Previous records within EMR system ED visit for same on 07/31/2023.  Started on Keflex at that time which she has been taking.  ED visit on 07/27/2023 for same.  Had negative x-ray imaging of bilateral knees.  ED visit on 07/28/2023 for same.  Had lab workup without evidence of systemic infection.  Afebrile, hemodynamically stable.  47 year old female with history of homelessness presenting for evaluation of bilateral knee pain.  Symptoms have been present for approximately 1 week.  On chart review, she has been seen for this 3 times since her reported fall.  Interestingly tells me that she has not had any history of trauma or falls.  There is no calf pain or erythema to suggest pneumonia or DVT.  Symptoms are bilateral further reducing my suspicion for DVT.  Lack of erythema purulent drainage lowers the risk of cellulitis, however patient is already on Keflex.  She reported some nausea since initiating Keflex 2 days ago.  She was encouraged to continue taking this medication.  Was given prescription for Zofran as well for her symptoms.  Also sent prescription for Flexeril for pain.  She is encouraged to take Tylenol and ibuprofen for her pain.  She was encouraged to follow-up with her primary care provider in 1 week for reevaluation.  She given return precautions.  Stable at discharge.  At this time there does not appear to be any evidence of an acute emergency medical condition and the patient appears stable for discharge with appropriate outpatient follow up. Diagnosis was discussed with patient who verbalizes  understanding of care plan and is agreeable to discharge. I have discussed return precautions with patient who verbalizes understanding. Patient encouraged to follow-up with their PCP within 1 week. All questions answered.  Note: Portions of this report may have been transcribed using voice recognition software. Every effort was made to ensure accuracy; however, inadvertent computerized transcription errors may still be present.         Final Clinical Impression(s) / ED Diagnoses Final diagnoses:  Acute pain of both knees    Rx / DC Orders ED Discharge Orders          Ordered  ondansetron (ZOFRAN-ODT) 4 MG disintegrating tablet  Every 8 hours PRN        08/02/23 1918    cyclobenzaprine (FLEXERIL) 10 MG tablet  2 times daily PRN        08/02/23 1918              Mora Bellman 08/02/23 1919    Terald Sleeper, MD 08/02/23 2153

## 2023-08-02 NOTE — ED Triage Notes (Signed)
BIB EMS for bilateral leg pain. Pt was seen at [facility] 2 days ago and prescribed abx, has not taken the abx. Pt is homeless.

## 2023-08-02 NOTE — Discharge Instructions (Addendum)
You have been seen today for your complaint of pain to both knees. Your discharge medications include Keflex.  Continue taking this antibiotic as prescribed. Zofran.  This is a medicine used to help with your nausea.  Take this as needed. Flexeril. This is a muscle relaxer. It may cause drowsiness. Do not drive, operate heavy machinery or make important decisions when taking this medication. Only take it at night until you know how it affects you. Only take it as needed and take other medications such as ibuprofen or tylenol prior to trying this medication. Follow up with: Your primary care provider in 1 week for reevaluation Please seek immediate medical care if you develop any of the following symptoms: You cannot move the joint. Your fingers or toes tingle, become numb, or turn cold and blue. You have a fever along with a joint that's red, warm, and swollen. At this time there does not appear to be the presence of an emergent medical condition, however there is always the potential for conditions to change. Please read and follow the below instructions.  Do not take your medicine if  develop an itchy rash, swelling in your mouth or lips, or difficulty breathing; call 911 and seek immediate emergency medical attention if this occurs.  You may review your lab tests and imaging results in their entirety on your MyChart account.  Please discuss all results of fully with your primary care provider and other specialist at your follow-up visit.  Note: Portions of this text may have been transcribed using voice recognition software. Every effort was made to ensure accuracy; however, inadvertent computerized transcription errors may still be present.

## 2023-08-02 NOTE — ED Notes (Signed)
Provided pt with facility issued socks and shoes so that she can take bus home

## 2023-08-03 DIAGNOSIS — M25561 Pain in right knee: Secondary | ICD-10-CM | POA: Insufficient documentation

## 2023-08-03 DIAGNOSIS — I10 Essential (primary) hypertension: Secondary | ICD-10-CM | POA: Insufficient documentation

## 2023-08-03 DIAGNOSIS — M25562 Pain in left knee: Secondary | ICD-10-CM | POA: Diagnosis not present

## 2023-08-03 DIAGNOSIS — Z5900 Homelessness unspecified: Secondary | ICD-10-CM | POA: Insufficient documentation

## 2023-08-03 DIAGNOSIS — Z79899 Other long term (current) drug therapy: Secondary | ICD-10-CM | POA: Diagnosis not present

## 2023-08-03 DIAGNOSIS — M79604 Pain in right leg: Secondary | ICD-10-CM | POA: Diagnosis present

## 2023-08-03 NOTE — ED Triage Notes (Signed)
Pt complains of chronic bilateral leg pain and nausea. Pt was seen and discharged for same x 4 hours earlier. Pt is homeless and asking for food and drink.

## 2023-08-03 NOTE — Discharge Instructions (Signed)
Please take your previously prescribed medications. Follow up with your PCP.

## 2023-08-03 NOTE — ED Provider Notes (Signed)
[place] EMERGENCY DEPARTMENT AT [facility] Provider Note   CSN: [id number] Arrival date & time: 08/02/23  2345     History Chief Complaint  Patient presents with   Leg Pain    Judy Robles is a 47 y.o. female with history of bipolar disorder, anxiety, depression, hypertension presents to the emergency department today for bilateral leg pain.  Patient was just seen a few hours prior and was discharged.  Patient is stating waiting room and checked back into the emergency department.  On my initial presentation, she reports that her blood pressure was high, blood pressures at 140 systolically.  She denies any chest pain or shortness of breath.  She is requesting food and narcotics prescription.  Asked why she needs the narcotics and she reports "because I need it". No other pain medication trialed. She is asking for food and to keep lying down.  Denies any fever or any new trauma since being discharged a few hours prior.  Denies any chest pain or shortness of breath.   Leg Pain Associated symptoms: no fever        Home Medications Prior to Admission medications   Medication Sig Start Date End Date Taking? Authorizing Provider  cephALEXin (KEFLEX) 500 MG capsule 2 caps po bid x 7 days 07/31/23   Melene Plan, DO  cyclobenzaprine (FLEXERIL) 10 MG tablet Take 1 tablet (10 mg total) by mouth 2 (two) times daily as needed for up to 7 days for muscle spasms. 08/02/23 08/09/23  Schutt, Edsel Petrin, PA-C  gabapentin (NEURONTIN) 100 MG capsule Take 1 capsule (100 mg total) by mouth 2 (two) times daily. 06/26/23 07/26/23  Karie Fetch, MD  gabapentin (NEURONTIN) 300 MG capsule Take 1 capsule (300 mg total) by mouth at bedtime. 06/26/23 07/26/23  Karie Fetch, MD  metoprolol tartrate (LOPRESSOR) 25 MG tablet Take 25 mg by mouth daily. 07/21/23   [provider]  mirtazapine (REMERON) 15 MG tablet Take 1 tablet (15 mg total) by mouth at bedtime. Patient not taking:  Reported on 07/22/2023 06/26/23 07/26/23  Karie Fetch, MD  ondansetron (ZOFRAN-ODT) 4 MG disintegrating tablet Take 1 tablet (4 mg total) by mouth every 8 (eight) hours as needed for nausea or vomiting. 08/02/23   Schutt, Edsel Petrin, PA-C      Allergies    Tylenol [acetaminophen], Lisinopril, and Mirtazapine    Review of Systems   Review of Systems  Constitutional:  Negative for chills and fever.  Respiratory:  Negative for shortness of breath.   Cardiovascular:  Negative for chest pain.  Musculoskeletal:  Positive for arthralgias.    Physical Exam Updated Vital Signs BP (!) 149/63 (BP Location: Right Arm)   Pulse 78   Temp 97.7 F (36.5 C) (Oral)   Resp 18   LMP  (LMP Unknown)   SpO2 100%  Physical Exam Vitals and nursing note reviewed.  Constitutional:      General: She is not in acute distress.    Appearance: She is not toxic-appearing.  Cardiovascular:     Rate and Rhythm: Normal rate.     Pulses:          Dorsalis pedis pulses are 2+ on the right side and 2+ on the left side.       Posterior tibial pulses are 2+ on the right side and 2+ on the left side.  Pulmonary:     Effort: Pulmonary effort is normal. No respiratory distress.  Musculoskeletal:     Comments: Compartments  are soft in the lower extremities.  She has scabbed anterior knees.  Flexion-extension present.  There is no surrounding erythema or discharge. There is some pink granulosus tissue surrounding the scabs..  No surrounding warmth.  No lower leg edema.  Palpable DP and PT pulses.  Sensation fully intact and symmetric per patient.  Skin:    General: Skin is warm and dry.  Neurological:     Mental Status: She is alert.     ED Results / Procedures / Treatments   Labs (all labs ordered are listed, but only abnormal results are displayed) Labs Reviewed - No data to display  EKG None  Radiology No results found.  Procedures Procedures   Medications Ordered in ED Medications - No data to  display  ED Course/ Medical Decision Making/ A&P    Medical Decision Making  47 y.o. female presents to the ER today for evaluation of hunger, homelessness. Differential diagnosis includes but is not limited to homelessness, secondary gain. Vital signs show unremarkable. Physical exam as noted above.   Patient was recently discharged here just a few hours prior for similar presentation.  No new trauma or falls.  She is requesting food and a place to lie down.  She is also requesting narcotic pain medication but reports is not for the pain in her knees.  I discussed with her the that is not appropriate.  Her knees do appear scabbed but there is no surrounding signs of warmth or erythema.  There is no purulent discharge.  She is able to flex and extend the knees.  Compartments are soft and she is palpable DP and PT pulses bilaterally.  Sensation reportedly intact and symmetric per patient.  No new injury, patient's been seen 5 times the past 6 days for similar presentations.  She is given food here as well as shelter resources.  Stable for discharge.  We discussed plan at bedside. We discussed strict return precautions and red flag symptoms. The patient verbalized their understanding and agrees to the plan. The patient is stable and being discharged home in good condition.  Portions of this report may have been transcribed using voice recognition software. Every effort was made to ensure accuracy; however, inadvertent computerized transcription errors may be present.   Final Clinical Impression(s) / ED Diagnoses Final diagnoses:  Homelessness  Hungry, initial encounter    Rx / DC Orders ED Discharge Orders     None         Achille Rich, PA-C 08/03/23 4098    Gilda Crease, MD 08/10/23 [phone]

## 2023-08-04 ENCOUNTER — Emergency Department (HOSPITAL_COMMUNITY)
Admission: EM | Admit: 2023-08-04 | Discharge: 2023-08-04 | Disposition: A | Discharge status: Home / Self Care | Payer: BLUE CROSS/BLUE SHIELD | Attending: Emergency Medicine | Admitting: Emergency Medicine

## 2023-08-04 ENCOUNTER — Other Ambulatory Visit: Payer: Self-pay

## 2023-08-04 DIAGNOSIS — Z5900 Homelessness unspecified: Secondary | ICD-10-CM

## 2023-08-04 DIAGNOSIS — M25561 Pain in right knee: Secondary | ICD-10-CM

## 2023-08-04 DIAGNOSIS — M25562 Pain in left knee: Secondary | ICD-10-CM

## 2023-08-04 DIAGNOSIS — R030 Elevated blood-pressure reading, without diagnosis of hypertension: Secondary | ICD-10-CM

## 2023-08-04 MED ORDER — IBUPROFEN 400 MG PO TABS
600.0000 mg | ORAL_TABLET | Freq: Once | ORAL | Status: AC
  Administered 2023-08-04: 600 mg via ORAL
  Filled 2023-08-04: qty 1

## 2023-08-04 MED ORDER — METOPROLOL TARTRATE 25 MG PO TABS
25.0000 mg | ORAL_TABLET | ORAL | Status: AC
  Administered 2023-08-04: 25 mg via ORAL
  Filled 2023-08-04: qty 1

## 2023-08-04 NOTE — Discharge Instructions (Addendum)
Please follow up with the primary care provider. Take your medications as prescribed. I have included shelter resources for you to follow up with.

## 2023-08-04 NOTE — ED Provider Notes (Signed)
[place] EMERGENCY DEPARTMENT AT [facility] Provider Note   CSN: [id number] Arrival date & time: 08/03/23  2358     History Chief Complaint  Patient presents with   Knee Pain     Hypertensive     Judy Robles is a 47 y.o. female f bipolar disorder, anxiety, depression, hypertension presents to the emergency department today for bilateral leg pain. She was seen yesterday by myself with similar presentation. She is currently experiencing homelessness. Was found to be hypertensive, but did not take her medication. She is asymptomatic from this, no chest pain, SOB, HA or visual changes. No medications for pain taken. Denies discharge from the wounds. Has not followed up with PCP.  HPI     Home Medications Prior to Admission medications   Medication Sig Start Date End Date Taking? Authorizing Provider  cephALEXin (KEFLEX) 500 MG capsule 2 caps po bid x 7 days 07/31/23   Melene Plan, DO  cyclobenzaprine (FLEXERIL) 10 MG tablet Take 1 tablet (10 mg total) by mouth 2 (two) times daily as needed for up to 7 days for muscle spasms. 08/02/23 08/09/23  Schutt, Edsel Petrin, PA-C  gabapentin (NEURONTIN) 100 MG capsule Take 1 capsule (100 mg total) by mouth 2 (two) times daily. 06/26/23 07/26/23  Karie Fetch, MD  gabapentin (NEURONTIN) 300 MG capsule Take 1 capsule (300 mg total) by mouth at bedtime. 06/26/23 07/26/23  Karie Fetch, MD  metoprolol tartrate (LOPRESSOR) 25 MG tablet Take 25 mg by mouth daily. 07/21/23   [provider]  mirtazapine (REMERON) 15 MG tablet Take 1 tablet (15 mg total) by mouth at bedtime. Patient not taking: Reported on 07/22/2023 06/26/23 07/26/23  Karie Fetch, MD  ondansetron (ZOFRAN-ODT) 4 MG disintegrating tablet Take 1 tablet (4 mg total) by mouth every 8 (eight) hours as needed for nausea or vomiting. 08/02/23   Schutt, Edsel Petrin, PA-C      Allergies    Tylenol [acetaminophen], Lisinopril, and Mirtazapine    Review of Systems    Review of Systems  Constitutional:  Negative for fever.  Eyes:  Negative for visual disturbance.  Respiratory:  Negative for shortness of breath.   Cardiovascular:  Negative for chest pain.  Musculoskeletal:  Positive for arthralgias.  Neurological:  Negative for headaches.    Physical Exam Updated Vital Signs BP (!) 177/65 (BP Location: Left Arm)   Pulse 79   Temp 97.9 F (36.6 C) (Oral)   Resp 18   LMP  (LMP Unknown)   SpO2 100%  Physical Exam Vitals and nursing note reviewed.  Constitutional:      General: She is not in acute distress.    Appearance: She is not ill-appearing or toxic-appearing.     Comments: Sleeping, appears disheveled. Awakens to voice.   Eyes:     General: No scleral icterus. Pulmonary:     Effort: Pulmonary effort is normal. No respiratory distress.  Musculoskeletal:     Right lower leg: No edema.     Left lower leg: No edema.     Comments: Compartments are soft in the lower extremities.  She has scabbed anterior knees.  Flexion-extension present.  There is no surrounding erythema or discharge. There is some pink granulosus tissue surrounding the scabs. No surrounding warmth.  No lower leg edema.  Palpable DP and PT pulses.  Sensation fully intact and symmetric per patient.   Skin:    General: Skin is warm and dry.  Neurological:     General: No  focal deficit present.     ED Results / Procedures / Treatments   Labs (all labs ordered are listed, but only abnormal results are displayed) Labs Reviewed - No data to display  EKG None  Radiology No results found.  Procedures Procedures   Medications Ordered in ED Medications  metoprolol tartrate (LOPRESSOR) tablet 25 mg (has no administration in time range)  ibuprofen (ADVIL) tablet 600 mg (has no administration in time range)    ED Course/ Medical Decision Making/ A&P                               Medical Decision Making Risk Prescription drug management.   47 y.o. female  presents to the ER today for evaluation of bilateral knee pain. Differential diagnosis includes but is not limited to arthritis, chronic pain. Vital signs show elevated BP at 177/65, otherwise unremarkable. The patient did not take her BP medication this morning. Physical exam as noted above.   The patient has been seen 3 times in almost the past 48 hours for knee pain. The knees do not appear to be infected. She is ambulatory without issue. She denies any new trauma. She was seen on 07/27/23 for a fall where she sustained the injuries. She is NV intact distally. She is able to flex and extend the knee with some pain. There is overlying scabbing, but no fluctuance/induration/discharge. Pink granulosus tissue surrounding. They appear similar to yesterday when I evaluated her.   Blood pressure does appear to be elevated at 177/65 however she denies any chest pain or shortness of breath.  No headaches or blurry vision.  She did not take her blood pressure medication this morning.  Blood pressure medicine provided for her.  Now BP is 157/62. Discussed with her again that she would need to follow-up with a new medicine provider.  There ration for the [facility] was provided again for the patient. Return precautions provided.   Portions of this report may have been transcribed using voice recognition software. Every effort was made to ensure accuracy; however, inadvertent computerized transcription errors may be present.   Final Clinical Impression(s) / ED Diagnoses Final diagnoses:  Elevated blood pressure reading  Acute pain of both knees  Homelessness    Rx / DC Orders ED Discharge Orders     None         Achille Rich, PA-C 08/04/23 0700    Glynn Octave, MD 08/05/23 [phone]

## 2023-08-04 NOTE — ED Triage Notes (Signed)
Patient reports bilateral knee pain this week , denies injury , ambulatory . Hypertensive at triage .

## 2023-09-26 ENCOUNTER — Emergency Department (HOSPITAL_COMMUNITY)
Admission: EM | Admit: 2023-09-26 | Discharge: 2023-09-26 | Discharge status: Against Medical Advice | Payer: BLUE CROSS/BLUE SHIELD | Attending: Emergency Medicine | Admitting: Emergency Medicine

## 2023-09-26 ENCOUNTER — Encounter (HOSPITAL_COMMUNITY): Payer: Self-pay | Admitting: Emergency Medicine

## 2023-09-26 ENCOUNTER — Emergency Department (HOSPITAL_COMMUNITY): Payer: BLUE CROSS/BLUE SHIELD

## 2023-09-26 DIAGNOSIS — Z5321 Procedure and treatment not carried out due to patient leaving prior to being seen by health care provider: Secondary | ICD-10-CM | POA: Insufficient documentation

## 2023-09-26 DIAGNOSIS — M79671 Pain in right foot: Secondary | ICD-10-CM | POA: Diagnosis present

## 2023-09-26 NOTE — ED Notes (Signed)
Called for room and unable to find

## 2023-09-26 NOTE — ED Triage Notes (Addendum)
PT reports right foot pain. Pt has strong odor of alcohol and reports been drinking earlier. Denies falling. Hx of foot surgery with pins and rods.

## 2023-10-23 NOTE — Congregational Nurse Program (Signed)
 Client to office. She states she is out of medication and in need of refills. She also doesn't have a PCP. RN assisted in getting PCP scheduled at [facility] at Friday March 7th at 3:30 pm. She will have meds sent to [facility] on [address] so she can afford them. She also endorses she needs anxiety meds and to be seen by psychiatry. She will go to walk in appointment tomorrow morning for medicines. She also endorsed needing a pain management clinic for suboxone help, she will need referral once established with PCP. Lastly, she endorsed needing new glasses and was sent to [facility] on [address] for walkin appointment. She was given 8 bus tickets for these things and will return on Wednesday to get bus pass for doctors appointment.

## 2023-10-27 ENCOUNTER — Ambulatory Visit: Admitting: Family Medicine
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
## 2023-10-30 ENCOUNTER — Ambulatory Visit: Admitting: Physician Assistant

## 2023-11-20 ENCOUNTER — Emergency Department (HOSPITAL_COMMUNITY): Payer: MEDICAID

## 2023-11-20 ENCOUNTER — Encounter (HOSPITAL_COMMUNITY): Payer: Self-pay | Admitting: Emergency Medicine

## 2023-11-20 ENCOUNTER — Emergency Department (HOSPITAL_COMMUNITY)
Admission: EM | Admit: 2023-11-20 | Discharge: 2023-11-20 | Disposition: A | Discharge status: Home / Self Care | Payer: MEDICAID | Attending: Emergency Medicine | Admitting: Emergency Medicine

## 2023-11-20 DIAGNOSIS — M25551 Pain in right hip: Secondary | ICD-10-CM | POA: Insufficient documentation

## 2023-11-20 DIAGNOSIS — F10920 Alcohol use, unspecified with intoxication, uncomplicated: Secondary | ICD-10-CM | POA: Diagnosis present

## 2023-11-20 DIAGNOSIS — I10 Essential (primary) hypertension: Secondary | ICD-10-CM | POA: Diagnosis not present

## 2023-11-20 DIAGNOSIS — Y906 Blood alcohol level of 120-199 mg/100 ml: Secondary | ICD-10-CM | POA: Insufficient documentation

## 2023-11-20 DIAGNOSIS — W19XXXA Unspecified fall, initial encounter: Secondary | ICD-10-CM | POA: Insufficient documentation

## 2023-11-20 LAB — CBC WITH DIFFERENTIAL/PLATELET
Abs Immature Granulocytes: 0.02 10*3/uL (ref 0.00–0.07)
Basophils Absolute: 0.1 10*3/uL (ref 0.0–0.1)
Basophils Relative: 1 %
Eosinophils Absolute: 0.3 10*3/uL (ref 0.0–0.5)
Eosinophils Relative: 5 %
HCT: 39 % (ref 36.0–46.0)
Hemoglobin: 12.8 g/dL (ref 12.0–15.0)
Immature Granulocytes: 0 %
Lymphocytes Relative: 33 %
Lymphs Abs: 2.1 10*3/uL (ref 0.7–4.0)
MCH: 31.8 pg (ref 26.0–34.0)
MCHC: 32.8 g/dL (ref 30.0–36.0)
MCV: 97 fL (ref 80.0–100.0)
Monocytes Absolute: 0.5 10*3/uL (ref 0.1–1.0)
Monocytes Relative: 8 %
Neutro Abs: 3.3 10*3/uL (ref 1.7–7.7)
Neutrophils Relative %: 53 %
Platelets: 164 10*3/uL (ref 150–400)
RBC: 4.02 MIL/uL (ref 3.87–5.11)
RDW: 14.4 % (ref 11.5–15.5)
WBC: 6.2 10*3/uL (ref 4.0–10.5)
nRBC: 0 % (ref 0.0–0.2)

## 2023-11-20 LAB — COMPREHENSIVE METABOLIC PANEL WITH GFR
ALT: 60 U/L — ABNORMAL HIGH (ref 0–44)
AST: 81 U/L — ABNORMAL HIGH (ref 15–41)
Albumin: 3.5 g/dL (ref 3.5–5.0)
Alkaline Phosphatase: 62 U/L (ref 38–126)
Anion gap: 11 (ref 5–15)
BUN: 9 mg/dL (ref 6–20)
CO2: 25 mmol/L (ref 22–32)
Calcium: 9.5 mg/dL (ref 8.9–10.3)
Chloride: 102 mmol/L (ref 98–111)
Creatinine, Ser: 0.87 mg/dL (ref 0.44–1.00)
GFR, Estimated: >60 mL/min (ref >60)
Glucose, Bld: 89 mg/dL (ref 70–99)
Potassium: 3.5 mmol/L (ref 3.5–5.1)
Sodium: 138 mmol/L (ref 135–145)
Total Bilirubin: 0.5 mg/dL (ref 0.0–1.2)
Total Protein: 7.7 g/dL (ref 6.5–8.1)

## 2023-11-20 LAB — URINALYSIS, W/ REFLEX TO CULTURE (INFECTION SUSPECTED)
Bilirubin Urine: NEGATIVE
Glucose, UA: NEGATIVE mg/dL
Ketones, ur: NEGATIVE mg/dL
Leukocytes,Ua: NEGATIVE
Nitrite: NEGATIVE
Protein, ur: NEGATIVE mg/dL
Specific Gravity, Urine: 1.008 (ref 1.005–1.030)
pH: 6 (ref 5.0–8.0)

## 2023-11-20 LAB — HCG, SERUM, QUALITATIVE: Preg, Serum: NEGATIVE

## 2023-11-20 LAB — ACETAMINOPHEN LEVEL: Acetaminophen (Tylenol), Serum: <10 ug/mL — ABNORMAL LOW (ref 10–30)

## 2023-11-20 LAB — ETHANOL: Alcohol, Ethyl (B): 144 mg/dL — ABNORMAL HIGH (ref <10)

## 2023-11-20 MED ORDER — ADULT MULTIVITAMIN W/MINERALS CH: 1.0000 | ORAL_TABLET | Freq: Every day | ORAL | Status: DC

## 2023-11-20 MED ORDER — THIAMINE MONONITRATE 100 MG PO TABS
100.0000 mg | ORAL_TABLET | Freq: Every day | ORAL | Status: DC
Start: 2023-11-20 — End: 2023-11-20

## 2023-11-20 MED ORDER — LORAZEPAM 1 MG PO TABS: 1.0000 mg | ORAL_TABLET | ORAL | Status: DC | PRN

## 2023-11-20 MED ORDER — LORAZEPAM 2 MG/ML IJ SOLN
1.0000 mg | INTRAMUSCULAR | Status: DC | PRN
  Administered 2023-11-20: 2 mg via INTRAVENOUS
  Filled 2023-11-20: qty 1

## 2023-11-20 MED ORDER — FOLIC ACID 1 MG PO TABS: 1.0000 mg | ORAL_TABLET | Freq: Every day | ORAL | Status: DC

## 2023-11-20 MED ORDER — THIAMINE HCL 100 MG/ML IJ SOLN: 100.0000 mg | Freq: Every day | INTRAMUSCULAR | Status: DC

## 2023-11-20 MED ORDER — FENTANYL CITRATE PF 50 MCG/ML IJ SOSY
50.0000 ug | PREFILLED_SYRINGE | Freq: Once | INTRAMUSCULAR | Status: AC
  Administered 2023-11-20: 50 ug via INTRAVENOUS
  Filled 2023-11-20: qty 1

## 2023-11-20 NOTE — Discharge Instructions (Signed)
 Your workup tonight was reassuring.  Your hip x-ray was negative for any fracture or dislocation.  You may take over-the-counter medications for pain control at home.  I have provided resource guides for outpatient substance abuse treatment in this area.  Please contact them for assistance with alcohol cessation.  If you develop any life-threatening symptoms please return to the emergency department.

## 2023-11-20 NOTE — ED Triage Notes (Signed)
 Patient BIB EMS from a friend's house. Patient is c/o alcohol withdrawal symptoms. Patient is c/o nausea. Patient last drink was this morning.
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
## 2023-11-20 NOTE — ED Notes (Signed)
 Patient ambulated without difficulty; food given
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
## 2023-12-14 ENCOUNTER — Other Ambulatory Visit (HOSPITAL_COMMUNITY)
Admission: EM | Admit: 2023-12-14 | Discharge: 2023-12-14 | Disposition: A | Discharge status: Home / Self Care | Payer: MEDICAID | Attending: Psychiatry | Admitting: Psychiatry

## 2023-12-14 ENCOUNTER — Other Ambulatory Visit (HOSPITAL_COMMUNITY)
Admission: EM | Admit: 2023-12-14 | Discharge: 2023-12-17 | Disposition: A | Discharge status: Home / Self Care | Payer: MEDICAID | Source: Home / Self Care | Admitting: Behavioral Health

## 2023-12-14 DIAGNOSIS — F1190 Opioid use, unspecified, uncomplicated: Secondary | ICD-10-CM | POA: Insufficient documentation

## 2023-12-14 DIAGNOSIS — F4310 Post-traumatic stress disorder, unspecified: Secondary | ICD-10-CM

## 2023-12-14 DIAGNOSIS — F3181 Bipolar II disorder: Secondary | ICD-10-CM | POA: Diagnosis not present

## 2023-12-14 DIAGNOSIS — F332 Major depressive disorder, recurrent severe without psychotic features: Secondary | ICD-10-CM | POA: Diagnosis present

## 2023-12-14 DIAGNOSIS — Z5900 Homelessness unspecified: Secondary | ICD-10-CM | POA: Diagnosis not present

## 2023-12-14 DIAGNOSIS — F1420 Cocaine dependence, uncomplicated: Secondary | ICD-10-CM | POA: Insufficient documentation

## 2023-12-14 DIAGNOSIS — F1290 Cannabis use, unspecified, uncomplicated: Secondary | ICD-10-CM | POA: Insufficient documentation

## 2023-12-14 DIAGNOSIS — F1090 Alcohol use, unspecified, uncomplicated: Secondary | ICD-10-CM | POA: Insufficient documentation

## 2023-12-14 DIAGNOSIS — Z560 Unemployment, unspecified: Secondary | ICD-10-CM | POA: Insufficient documentation

## 2023-12-14 DIAGNOSIS — F1910 Other psychoactive substance abuse, uncomplicated: Secondary | ICD-10-CM | POA: Insufficient documentation

## 2023-12-14 DIAGNOSIS — F1920 Other psychoactive substance dependence, uncomplicated: Secondary | ICD-10-CM

## 2023-12-14 LAB — POCT URINE DRUG SCREEN - MANUAL ENTRY (I-SCREEN)
POC Amphetamine UR: NOT DETECTED
POC Buprenorphine (BUP): NOT DETECTED
POC Cocaine UR: POSITIVE — AB
POC Marijuana UR: POSITIVE — AB
POC Methadone UR: NOT DETECTED
POC Methamphetamine UR: NOT DETECTED
POC Morphine: NOT DETECTED
POC Oxazepam (BZO): NOT DETECTED
POC Oxycodone UR: POSITIVE — AB
POC Secobarbital (BAR): NOT DETECTED

## 2023-12-14 LAB — POC URINE PREG, ED: Preg Test, Ur: NEGATIVE

## 2023-12-14 MED ORDER — LORAZEPAM 2 MG/ML IJ SOLN: 2.0000 mg | Freq: Three times a day (TID) | INTRAMUSCULAR | Status: DC | PRN

## 2023-12-14 MED ORDER — HALOPERIDOL 5 MG PO TABS: 5.0000 mg | ORAL_TABLET | Freq: Three times a day (TID) | ORAL | Status: DC | PRN

## 2023-12-14 MED ORDER — ONDANSETRON 4 MG PO TBDP: 4.0000 mg | ORAL_TABLET | Freq: Four times a day (QID) | ORAL | Status: DC | PRN

## 2023-12-14 MED ORDER — DIPHENHYDRAMINE HCL 50 MG/ML IJ SOLN: 50.0000 mg | Freq: Three times a day (TID) | INTRAMUSCULAR | Status: DC | PRN

## 2023-12-14 MED ORDER — ALUM & MAG HYDROXIDE-SIMETH 200-200-20 MG/5ML PO SUSP
30.0000 mL | ORAL | Status: DC | PRN
Start: 2023-12-14 — End: 2023-12-17

## 2023-12-14 MED ORDER — HYDROXYZINE HCL 25 MG PO TABS
25.0000 mg | ORAL_TABLET | Freq: Three times a day (TID) | ORAL | Status: DC | PRN
  Administered 2023-12-14 – 2023-12-16 (×6): 25 mg via ORAL
  Filled 2023-12-14: qty 1
  Filled 2023-12-14: qty 20
  Filled 2023-12-14 (×5): qty 1

## 2023-12-14 MED ORDER — MAGNESIUM HYDROXIDE 400 MG/5ML PO SUSP: 30.0000 mL | Freq: Every day | ORAL | Status: DC | PRN

## 2023-12-14 MED ORDER — METHOCARBAMOL 500 MG PO TABS
500.0000 mg | ORAL_TABLET | Freq: Three times a day (TID) | ORAL | Status: DC | PRN
  Administered 2023-12-14 – 2023-12-17 (×7): 500 mg via ORAL
  Filled 2023-12-14 (×7): qty 1

## 2023-12-14 MED ORDER — LOPERAMIDE HCL 2 MG PO CAPS: 2.0000 mg | ORAL_CAPSULE | ORAL | Status: DC | PRN

## 2023-12-14 MED ORDER — DIPHENHYDRAMINE HCL 50 MG PO CAPS: 50.0000 mg | ORAL_CAPSULE | Freq: Three times a day (TID) | ORAL | Status: DC | PRN

## 2023-12-14 MED ORDER — ADULT MULTIVITAMIN W/MINERALS CH
1.0000 | ORAL_TABLET | Freq: Every day | ORAL | Status: DC
  Administered 2023-12-14 – 2023-12-17 (×4): 1 via ORAL
  Filled 2023-12-14 (×3): qty 1
  Filled 2023-12-14: qty 14
  Filled 2023-12-14: qty 1

## 2023-12-14 MED ORDER — NAPROXEN 500 MG PO TABS
500.0000 mg | ORAL_TABLET | Freq: Two times a day (BID) | ORAL | Status: DC | PRN
  Administered 2023-12-14: 500 mg via ORAL
  Filled 2023-12-14: qty 1

## 2023-12-14 MED ORDER — HALOPERIDOL LACTATE 5 MG/ML IJ SOLN: 10.0000 mg | Freq: Three times a day (TID) | INTRAMUSCULAR | Status: DC | PRN

## 2023-12-14 MED ORDER — LORAZEPAM 1 MG PO TABS: 1.0000 mg | ORAL_TABLET | Freq: Four times a day (QID) | ORAL | Status: DC | PRN

## 2023-12-14 MED ORDER — THIAMINE HCL 100 MG/ML IJ SOLN
100.0000 mg | Freq: Once | INTRAMUSCULAR | Status: AC
  Administered 2023-12-14: 100 mg via INTRAMUSCULAR
  Filled 2023-12-14: qty 2

## 2023-12-14 MED ORDER — HALOPERIDOL LACTATE 5 MG/ML IJ SOLN: 5.0000 mg | Freq: Three times a day (TID) | INTRAMUSCULAR | Status: DC | PRN

## 2023-12-14 MED ORDER — DICYCLOMINE HCL 20 MG PO TABS: 20.0000 mg | ORAL_TABLET | Freq: Four times a day (QID) | ORAL | Status: DC | PRN

## 2023-12-14 MED ORDER — ALUM & MAG HYDROXIDE-SIMETH 200-200-20 MG/5ML PO SUSP: 30.0000 mL | ORAL | Status: DC | PRN

## 2023-12-14 MED ORDER — TRAZODONE HCL 50 MG PO TABS
50.0000 mg | ORAL_TABLET | Freq: Every evening | ORAL | Status: DC | PRN
  Administered 2023-12-14 – 2023-12-16 (×3): 50 mg via ORAL
  Filled 2023-12-14 (×2): qty 1
  Filled 2023-12-14: qty 14
  Filled 2023-12-14: qty 1

## 2023-12-14 MED ORDER — THIAMINE MONONITRATE 100 MG PO TABS
100.0000 mg | ORAL_TABLET | Freq: Every day | ORAL | Status: DC
  Administered 2023-12-15 – 2023-12-17 (×3): 100 mg via ORAL
  Filled 2023-12-14 (×3): qty 1

## 2023-12-14 NOTE — ED Notes (Signed)
 Patient transferred from [facility] to [facility] requesting assistance in detox from ETOH. Calm, cooperative throughout interview process. Skin assessment completed. Oriented to unit. Meal and drink offered. Patient alert & oriented x4. Denies intent to harm self or others when asked. Denies A/VH. Patient reports pain in R foot rating 10/10 due to cracking on heels. PRN medication administered. No acute distress noted. Support and encouragement provided. Routine safety checks conducted per facility protocol. Encouraged patient to notify staff if any thoughts of harm towards self or others arise. Patient verbalizes understanding and agreement.

## 2023-12-14 NOTE — ED Provider Notes (Signed)
 Behavioral Health Urgent Care Medical Screening Exam  Patient Name: Judy Robles MRN: [id number] Date of Evaluation: 12/14/23 Chief Complaint:  Per Triage note, "[place] presents to [facility] voluntarily unaccompanied. Pt was sent here from [facility] and is starting to feel withdraws from alcohol. Pt states that she is coming off of alcohol and opiates, and is seeking medication for depression. Pt states she is interested in taking suboxin to help with withdraws. Pt states that she is homeless now which makes it harder to stay sober. Pt states she has been having trouble sleeping. Pt states she is seeking inpatient for detox, knowing she will use when she leaves this facility. Pt denies SI, HI, AVH. Most recently used percocet 10 last night, yesterday about a 5th of vodka and some beers, and crack $5 worth two days ago."   Diagnosis:  Final diagnoses:  Polysubstance abuse (HCC)  Severe episode of recurrent major depressive disorder, without psychotic features (HCC)    History of Present illness: Judy Robles is a 48 y.o. female patient with a past psychiatric history significant for polysubstance abuse (opiates, cocaine, cannabis), alcohol use disorder, bipolar 2 disorder, MDD, PTSD, and a medical history of primary hypertension who presented to the [facility] voluntary on December 14, 2023 with complaints of worsening depression and substance abuse.  Patient reports seeking treatment for alcohol and opioid use and worsening depression.   Patient reports to drinking alcohol since age 27 years old. She report drinking alcohol for "awhile." She reports drinking alcohol "steady" for the past year that has worsened over the past 4 months. She reports drinking alcohol a lot. When asked how much alcohol does she drink on average, she reports drinking a 5th of liquor along with beers yesterday. She estimates drinking (3) 4 locos per day on average. Her last reported drink was yesterday.  She reports withdrawal symptoms of sweats and tremors. She denies a history of alcohol withdrawal seizures or delirium tremens. She states that she last received substance abuse treatment a couple months ago in [place] for 3 days.  She reports that she was prescribed opiates after she broke her leg and was prescribed pain pills and then had to have surgery. She reports abusing opiates a little over a month.She reports taking 4 Roxicodone  on average per day. She reports that she last used Roxicodone  2 to 3 days ago. She reports that she found an old prescription bottles of pain pills that she's been taking and denies buying opiates from off the street.  She reports using cocaine sporadically and states that she started using a few months ago. She reports last using cocaine 3 days ago.  She reports smoking marijuana once a week and states that she last used yesterday.  She reports feeling depressed for the past year. She reports that her 59 year old son was killed 5 years ago. She also reports having a son that is incarcerated. She describes her depressive symptoms as crying out of the blue for no reason, sadness, hopelessness, isolating, irritability, anhedonia, guilt, worthlessness, poor appetite, and decreased sleep.  She denies outpatient psychiatry or counseling. She reports that in the past she was prescribed gabapentin , Seroquel, and Zoloft . She reports that she has not taken her medications in the past 6 months. She reports a psychiatric history of manic depression, anxiety and ADHD.  Patient reports that she is currently homeless and is working with the [facility] to establish housing. She is unemployed. She denies legal issues.  Patient  reports a family psychiatric history of oldest son became addicted to Fentanyl  after her youngest son was killed.  Flowsheet Row ED from 12/14/2023 in [facility] ED from 11/20/2023 in [facility] Emergency Department at [facility] ED from 09/26/2023 in [facility] Emergency Department at [facility]  C-SSRS RISK CATEGORY No Risk No Risk No Risk       Psychiatric Specialty Exam  Presentation  General Appearance:Appropriate for Environment  Eye Contact:Fair  Speech:Clear and Coherent  Speech Volume:Normal  Handedness:Right   Mood and Affect  Mood: Depressed  Affect: Congruent   Thought Process  Thought Processes: Coherent; Goal Directed  Descriptions of Associations:Intact  Orientation:Full (Time, Place and Person)  Thought Content:Logical    Hallucinations:None  Ideas of Reference:None  Suicidal Thoughts:No  Homicidal Thoughts:No   Sensorium  Memory: Immediate Fair; Recent Fair; Remote Fair  Judgment: Intact  Insight: Present   Executive Functions  Concentration: Fair  Attention Span: Fair  Recall: [employer] of Knowledge: Fair  Language: Fair   Psychomotor Activity  Psychomotor Activity: Normal   Assets  Assets: Manufacturing systems engineer; Desire for Improvement   Sleep  Sleep: Poor  Number of hours:  2   Physical Exam: Physical Exam Cardiovascular:     Rate and Rhythm: Normal rate.  Musculoskeletal:        General: Normal range of motion.     Cervical back: Normal range of motion.  Neurological:     Mental Status: She is alert and oriented to person, place, and time.    Review of Systems  Constitutional:  Positive for diaphoresis.  HENT: Negative.    Eyes: Negative.   Respiratory: Negative.    Cardiovascular: Negative.   Gastrointestinal: Negative.   Genitourinary: Negative.   Musculoskeletal: Negative.   Neurological:        "Shakes"   Blood pressure 133/71, pulse 60, temperature (!) 97.5 F (36.4 C), temperature source Oral, resp. rate 18, SpO2 100%. There is no height or weight on file to calculate BMI.  Musculoskeletal: Strength & Muscle Tone: within normal limits Gait & Station: normal Patient leans:  N/A   [facility] MSE Discharge Disposition for Follow up and Recommendations: Based on my evaluation I certify that psychiatric inpatient services furnished can reasonably be expected to improve the patient's condition which I recommend transfer to an appropriate accepting facility.    Patient to be admitted to the facility based crisis center for substance use treatment and worsening depression from medication management and psychotherapy. Patient appears to minimize her substance abuse as she is not forthcoming with providing detailed information regarding her substance use history. Patient does have a documented extensive history of polysubstance abuse and could benefit from long-term residential treatment.  Labs  Lab Orders         CBC with Differential/Platelet         Comprehensive metabolic panel         Hemoglobin A1c         Ethanol         Lipid panel         TSH         RPR         HIV Antibody (routine testing w rflx)         POC urine preg, ED         POCT Urine Drug Screen - (I-Screen)    EKG  Will add CIWA to monitor alcohol withdrawal symptoms. Will  add Ativan  1 mg po every 6 hours prn for CIWA greater than 10. May consider adding ativan  or Librium  protocol if patient is having severe withdrawal.  Will add COWS to  monitor opioid withdrawal symptoms. Will consider adding clonidine  protocol if patient is experiencing severe withdrawal.   Jhalil Silvera L, NP 12/14/2023, 2:02 PM

## 2023-12-14 NOTE — Discharge Instructions (Signed)
 Transfer to [facility]

## 2023-12-14 NOTE — ED Notes (Signed)
Pt transferred to [facility]. 

## 2023-12-14 NOTE — ED Notes (Signed)
 Pt is in lying on his bed calm and sleeping with eyes closed. NAD. Respirations even and unlabored. Will monitor for safety.

## 2023-12-14 NOTE — ED Notes (Signed)
 Patient is in the bedroom sleeping comfortably. NAD  Respiration even and unlabored. Will monitor for safety.
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
## 2023-12-15 DIAGNOSIS — F3181 Bipolar II disorder: Secondary | ICD-10-CM | POA: Diagnosis not present

## 2023-12-15 DIAGNOSIS — F1910 Other psychoactive substance abuse, uncomplicated: Secondary | ICD-10-CM | POA: Diagnosis not present

## 2023-12-15 DIAGNOSIS — Z5900 Homelessness unspecified: Secondary | ICD-10-CM | POA: Diagnosis not present

## 2023-12-15 DIAGNOSIS — F4310 Post-traumatic stress disorder, unspecified: Secondary | ICD-10-CM | POA: Diagnosis not present

## 2023-12-15 LAB — CBC WITH DIFFERENTIAL/PLATELET
Abs Immature Granulocytes: 0.01 10*3/uL (ref 0.00–0.07)
Basophils Absolute: 0 10*3/uL (ref 0.0–0.1)
Basophils Relative: 1 %
Eosinophils Absolute: 0.1 10*3/uL (ref 0.0–0.5)
Eosinophils Relative: 3 %
HCT: 34.7 % — ABNORMAL LOW (ref 36.0–46.0)
Hemoglobin: 11.3 g/dL — ABNORMAL LOW (ref 12.0–15.0)
Immature Granulocytes: 0 %
Lymphocytes Relative: 41 %
Lymphs Abs: 1.8 10*3/uL (ref 0.7–4.0)
MCH: 31.9 pg (ref 26.0–34.0)
MCHC: 32.6 g/dL (ref 30.0–36.0)
MCV: 98 fL (ref 80.0–100.0)
Monocytes Absolute: 0.4 10*3/uL (ref 0.1–1.0)
Monocytes Relative: 8 %
Neutro Abs: 2.1 10*3/uL (ref 1.7–7.7)
Neutrophils Relative %: 47 %
Platelets: 150 10*3/uL (ref 150–400)
RBC: 3.54 MIL/uL — ABNORMAL LOW (ref 3.87–5.11)
RDW: 14.2 % (ref 11.5–15.5)
WBC: 4.4 10*3/uL (ref 4.0–10.5)
nRBC: 0 % (ref 0.0–0.2)

## 2023-12-15 LAB — COMPREHENSIVE METABOLIC PANEL WITH GFR
ALT: 36 U/L (ref 0–44)
AST: 40 U/L (ref 15–41)
Albumin: 2.5 g/dL — ABNORMAL LOW (ref 3.5–5.0)
Alkaline Phosphatase: 40 U/L (ref 38–126)
Anion gap: 9 (ref 5–15)
BUN: 23 mg/dL — ABNORMAL HIGH (ref 6–20)
CO2: 21 mmol/L — ABNORMAL LOW (ref 22–32)
Calcium: 8.2 mg/dL — ABNORMAL LOW (ref 8.9–10.3)
Chloride: 106 mmol/L (ref 98–111)
Creatinine, Ser: 0.72 mg/dL (ref 0.44–1.00)
GFR, Estimated: >60 mL/min (ref >60)
Glucose, Bld: 77 mg/dL (ref 70–99)
Potassium: 4.2 mmol/L (ref 3.5–5.1)
Sodium: 136 mmol/L (ref 135–145)
Total Bilirubin: 0.5 mg/dL (ref 0.0–1.2)
Total Protein: 5.8 g/dL — ABNORMAL LOW (ref 6.5–8.1)

## 2023-12-15 LAB — ETHANOL: Alcohol, Ethyl (B): <15 mg/dL (ref <15)

## 2023-12-15 LAB — GC/CHLAMYDIA PROBE AMP (~~LOC~~) NOT AT ARMC
Chlamydia: NEGATIVE
Comment: NEGATIVE
Comment: NORMAL
Neisseria Gonorrhea: NEGATIVE

## 2023-12-15 LAB — LIPID PANEL
Cholesterol: 133 mg/dL (ref 0–200)
HDL: 57 mg/dL (ref >40)
LDL Cholesterol: 69 mg/dL (ref 0–99)
Total CHOL/HDL Ratio: 2.3 ratio
Triglycerides: 34 mg/dL (ref <150)
VLDL: 7 mg/dL (ref 0–40)

## 2023-12-15 LAB — HIV ANTIBODY (ROUTINE TESTING W REFLEX): HIV Screen 4th Generation wRfx: NONREACTIVE

## 2023-12-15 LAB — TSH: TSH: 2.653 u[IU]/mL (ref 0.350–4.500)

## 2023-12-15 LAB — HEMOGLOBIN A1C
Hgb A1c MFr Bld: 4.9 % (ref 4.8–5.6)
Mean Plasma Glucose: 93.93 mg/dL

## 2023-12-15 LAB — RPR: RPR Ser Ql: NONREACTIVE

## 2023-12-15 MED ORDER — LITHIUM CARBONATE 300 MG PO CAPS
300.0000 mg | ORAL_CAPSULE | Freq: Two times a day (BID) | ORAL | Status: DC
  Administered 2023-12-15 – 2023-12-17 (×4): 300 mg via ORAL
  Filled 2023-12-15 (×3): qty 1
  Filled 2023-12-15: qty 28
  Filled 2023-12-15: qty 1

## 2023-12-15 NOTE — ED Provider Notes (Signed)
 Facility Based Crisis Admission H&P  Date: 12/15/23 Patient Name: Judy Robles MRN: [id number] Chief Complaint: " I feel depressed and hopeless, I want to quit drinking"  Diagnoses:  Final diagnoses:  None    HPI: Judy Robles is a 48 y.o. female patient with a past psychiatric history significant for polysubstance abuse (opiates, cocaine, cannabis), alcohol use disorder, bipolar 2 disorder, MDD, PTSD, and a medical history of primary hypertension who presented to the [facility] voluntary on December 14, 2023 with complaints of worsening depression and substance abuse.  Chart reviewed, case discussed with staff, patient seen during rounds.  Patient endorsed depressed mood, anhedonia, at times she feels helpless and hopeless.  Patient was in need of constant reassurance and support.  Patient reports that her 54 year old son was killed about 5 years ago, patient also reports that her other son is incarcerated.  Patient reports that she worries about his son.  She reports her sleep has been poor.  She reports she drinks" a lot" of alcohol to numb herself.  She reports using cocaine off and on.  Patient said she has been off medicine for a while.  She has been on gabapentin , Seroquel, Zoloft , Paxil , and Risperdal in the past.  Patient reports that she has been diagnosed with bipolar disorder.  She denies manic symptoms today but has experienced mania in the past.  We discussed titrating stabilizers.  Patient is willing to try lithium .  Side effect of lithium  discussed with the patient.  Patient was encouraged to keep herself hydrated and avoid ibuprofen .  Patient denies auditory or visual hallucinations.  Patient was encouraged to attend groups and work on her sobriety and coping strategies.  Patient reports that she wants to go to a residential facility.  She was encouraged to discuss rehab options with social worker on the unit.  PHQ 2-9:  Flowsheet Row ED from  12/14/2023 in [facility] ED from 06/21/2023 in [facility]  Thoughts that you would be better off dead, or of hurting yourself in some way Not at all Not at all  PHQ-9 Total Score 13 0       Flowsheet Row ED from 12/14/2023 in [facility] Most recent reading at 12/14/2023  4:45 PM ED from 12/14/2023 in [facility] Most recent reading at 12/14/2023 12:42 PM ED from 11/20/2023 in [facility] Emergency Department at [facility] Most recent reading at 11/20/2023 12:07 AM  C-SSRS RISK CATEGORY No Risk No Risk No Risk       Screenings    Flowsheet Row Most Recent Value  CIWA-Ar Total 2  COWS Total Score 0       Total Time spent with patient: 1 hour  Musculoskeletal  Strength & Muscle Tone: within normal limits Gait & Station: normal Patient leans: N/A  Psychiatric Specialty Exam  Presentation General Appearance:  Appropriate for Environment  Eye Contact: Fair  Speech: Clear and Coherent  Speech Volume: Normal  Handedness: Right   Mood and Affect  Mood: Depressed  Affect: Congruent   Thought Process  Thought Processes: Coherent; Goal Directed  Descriptions of Associations:Intact  Orientation:Full (Time, Place and Person)  Thought Content:Logical  Diagnosis of Schizophrenia or Schizoaffective disorder in past: No   Hallucinations:Denies Ideas of Reference:None  Suicidal Thoughts:Suicidal Thoughts: No  Homicidal Thoughts:Homicidal Thoughts: No   Sensorium  Memory: Immediate Fair; Recent Fair; Remote Fair  Judgment: Limited  Insight: Present   Art therapist  Concentration: Fair  Attention Span: Fair  Recall: [employer] of Knowledge: Fair  Language: Fair   Psychomotor Activity  Psychomotor Activity: Psychomotor Activity: Normal   Assets  Assets: Manufacturing systems engineer; Desire for  Improvement   Sleep  Sleep: Sleep: Poor    Nutritional Assessment (For OBS and FBC admissions only) Has the patient had a weight loss or gain of 10 pounds or more in the last 3 months?: No Has the patient had a decrease in food intake/or appetite?: Yes Does the patient have dental problems?: Yes Does the patient have eating habits or behaviors that may be indicators of an eating disorder including binging or inducing vomiting?: No Has the patient recently lost weight without trying?: 2.0 Has the patient been eating poorly because of a decreased appetite?: 1 Malnutrition Screening Tool Score: 3 Nutritional Assessment Referrals: Medication/Tx changes    Physical Exam Constitutional:      Appearance: Normal appearance.  HENT:     Head: Normocephalic and atraumatic.     Nose: No congestion.     Mouth/Throat:     Mouth: Mucous membranes are moist.  Eyes:     Pupils: Pupils are equal, round, and reactive to light.  Cardiovascular:     Rate and Rhythm: Regular rhythm.  Pulmonary:     Effort: Pulmonary effort is normal.  Skin:    General: Skin is warm.  Neurological:     General: No focal deficit present.     Mental Status: She is alert and oriented to person, place, and time.    Review of Systems  Constitutional:  Negative for chills and fever.  HENT:  Negative for hearing loss and sore throat.   Eyes:  Negative for blurred vision.  Respiratory:  Negative for cough and shortness of breath.   Cardiovascular:  Negative for chest pain and palpitations.  Gastrointestinal:  Negative for nausea and vomiting.  Neurological:  Negative for dizziness and speech change.  Psychiatric/Behavioral:  Positive for depression and substance abuse. Negative for hallucinations and suicidal ideas. The patient is nervous/anxious and has insomnia.     Blood pressure (!) 149/74, pulse 61, temperature 97.9 F (36.6 C), temperature source Oral, resp. rate 18, SpO2 97%. There is no height or  weight on file to calculate BMI.  Past Psychiatric History: She denies outpatient psychiatry or counseling. She reports that in the past she was prescribed gabapentin , Seroquel, and Zoloft . She reports that she has not taken her medications in the past 6 months. She reports a psychiatric history of manic depression, anxiety and ADHD.  Patient reports to drinking alcohol since age 22 years old. hs. She reports drinking alcohol a lot, she reports drinking a 5th of liquor along with beer. She estimates drinking (3) 4 locos per day on average. She denies a history of alcohol withdrawal seizures or delirium tremens. She states that she last received substance abuse treatment a couple months ago in [place] for 3 days. She reports using cocaine sporadically and states that she started using a few months ago. She reports last using cocaine 3-4 days ago.     Is the patient at risk to self? No  Has the patient been a risk to self in the past 6 months? No .    Has the patient been a risk to self within the distant past? No   Is the patient a risk to others? No   Has the patient been a risk to others  in the past 6 months? No   Has the patient been a risk to others within the distant past? No   Past Medical History: None reported by patient Family History: Patient reports her father has been diagnosed with bipolar disorder Social History: Patient reports she is homeless and has no source of income  Last Labs:  Admission on 12/14/2023  Component Date Value Ref Range Status   WBC 12/15/2023 4.4  4.0 - 10.5 K/uL Final   RBC 12/15/2023 3.54 (L)  3.87 - 5.11 MIL/uL Final   Hemoglobin 12/15/2023 11.3 (L)  12.0 - 15.0 g/dL Final   HCT 16/05/9603 34.7 (L)  36.0 - 46.0 % Final   MCV 12/15/2023 98.0  80.0 - 100.0 fL Final   MCH 12/15/2023 31.9  26.0 - 34.0 pg Final   MCHC 12/15/2023 32.6  30.0 - 36.0 g/dL Final   RDW 54/04/8118 14.2  11.5 - 15.5 % Final   Platelets 12/15/2023 150  150 - 400 K/uL Final   nRBC  12/15/2023 0.0  0.0 - 0.2 % Final   Neutrophils Relative % 12/15/2023 47  % Final   Neutro Abs 12/15/2023 2.1  1.7 - 7.7 K/uL Final   Lymphocytes Relative 12/15/2023 41  % Final   Lymphs Abs 12/15/2023 1.8  0.7 - 4.0 K/uL Final   Monocytes Relative 12/15/2023 8  % Final   Monocytes Absolute 12/15/2023 0.4  0.1 - 1.0 K/uL Final   Eosinophils Relative 12/15/2023 3  % Final   Eosinophils Absolute 12/15/2023 0.1  0.0 - 0.5 K/uL Final   Basophils Relative 12/15/2023 1  % Final   Basophils Absolute 12/15/2023 0.0  0.0 - 0.1 K/uL Final   Immature Granulocytes 12/15/2023 0  % Final   Abs Immature Granulocytes 12/15/2023 0.01  0.00 - 0.07 K/uL Final   Performed at [facility] Lab, [address], [place], [place] [address]   Sodium 12/15/2023 136  135 - 145 mmol/L Final   Potassium 12/15/2023 4.2  3.5 - 5.1 mmol/L Final   Chloride 12/15/2023 106  98 - 111 mmol/L Final   CO2 12/15/2023 21 (L)  22 - 32 mmol/L Final   Glucose, Bld 12/15/2023 77  70 - 99 mg/dL Final   Glucose reference range applies only to samples taken after fasting for at least 8 hours.   BUN 12/15/2023 23 (H)  6 - 20 mg/dL Final   Creatinine, Ser 12/15/2023 0.72  0.44 - 1.00 mg/dL Final   Calcium  12/15/2023 8.2 (L)  8.9 - 10.3 mg/dL Final   Total Protein 95/62/1308 5.8 (L)  6.5 - 8.1 g/dL Final   Albumin 65/78/4696 2.5 (L)  3.5 - 5.0 g/dL Final   AST 29/52/8413 40  15 - 41 U/L Final   ALT 12/15/2023 36  0 - 44 U/L Final   Alkaline Phosphatase 12/15/2023 40  38 - 126 U/L Final   Total Bilirubin 12/15/2023 0.5  0.0 - 1.2 mg/dL Final   GFR, Estimated 12/15/2023 >60  >60 mL/min Final   Comment: (NOTE) Calculated using the CKD-EPI Creatinine Equation (2021)    Anion gap 12/15/2023 9  5 - 15 Final   Performed at [facility], [address], [place], [place] [address]   Hgb A1c MFr Bld 12/15/2023 4.9  4.8 - 5.6 % Final   Comment: (NOTE) Pre diabetes:          5.7%-6.4%  Diabetes:              >  6.4%  Glycemic  control for   <7.0% adults with diabetes    Mean Plasma Glucose 12/15/2023 93.93  mg/dL Final   Performed at [facility] Lab, [address], [place], [place] [address]   Alcohol, Ethyl (B) 12/15/2023 <15  <15 mg/dL Final   Comment: Please note change in reference range. (NOTE) For medical purposes only. Performed at [facility], [address] [place], [place] [address]    HIV Screen 4th Generation wRfx 12/15/2023 Non Reactive  Non Reactive Final   Performed at [facility], [address] [place], [place] [address]   Cholesterol 12/15/2023 133  0 - 200 mg/dL Final   Triglycerides 91/47/8295 34  <150 mg/dL Final   HDL 62/13/0865 57  >40 mg/dL Final   Total CHOL/HDL Ratio 12/15/2023 2.3  RATIO Final   VLDL 12/15/2023 7  0 - 40 mg/dL Final   LDL Cholesterol 12/15/2023 69  0 - 99 mg/dL Final   Comment:        Total Cholesterol/HDL:CHD Risk Coronary Heart Disease Risk Table                     Men   Women  1/2 Average Risk   3.4   3.3  Average Risk       5.0   4.4  2 X Average Risk   9.6   7.1  3 X Average Risk  23.4   11.0        Use the calculated Patient Ratio above and the CHD Risk Table to determine the patient's CHD Risk.        ATP III CLASSIFICATION (LDL):  <100     mg/dL   Optimal  784-696  mg/dL   Near or Above                    Optimal  130-159  mg/dL   Borderline  295-284  mg/dL   High  >132     mg/dL   Very High Performed at [facility] Lab, [address] [place], [place] [address]    TSH 12/15/2023 2.653  0.350 - 4.500 uIU/mL Final   Comment: Performed by a 3rd Generation assay with a functional sensitivity of <=0.01 uIU/mL. Performed at [facility], [address], [place], [place] [address]   Admission on 12/14/2023, Discharged on 12/14/2023  Component Date Value Ref Range Status   Preg Test, Ur 12/14/2023 Negative  Negative Final   POC Amphetamine  UR 12/14/2023 None Detected  NONE DETECTED (Cut Off Level 1000 ng/mL) Final    POC Secobarbital (BAR) 12/14/2023 None Detected  NONE DETECTED (Cut Off Level 300 ng/mL) Final   POC Buprenorphine  (BUP) 12/14/2023 None Detected  NONE DETECTED (Cut Off Level 10 ng/mL) Final   POC Oxazepam (BZO) 12/14/2023 None Detected  NONE DETECTED (Cut Off Level 300 ng/mL) Final   POC Cocaine UR 12/14/2023 Positive (A)  NONE DETECTED (Cut Off Level 300 ng/mL) Final   POC Methamphetamine UR 12/14/2023 None Detected  NONE DETECTED (Cut Off Level 1000 ng/mL) Final   POC Morphine  12/14/2023 None Detected  NONE DETECTED (Cut Off Level 300 ng/mL) Final   POC Methadone UR 12/14/2023 None Detected  NONE DETECTED (Cut Off Level 300 ng/mL) Final   POC Oxycodone  UR 12/14/2023 Positive (A)  NONE DETECTED (Cut Off Level 100 ng/mL) Final   POC Marijuana UR 12/14/2023 Positive (A)  NONE DETECTED (Cut Off Level 50  ng/mL) Final  Admission on 11/20/2023, Discharged on 11/20/2023  Component Date Value Ref Range Status   Sodium 11/20/2023 138  135 - 145 mmol/L Final   Potassium 11/20/2023 3.5  3.5 - 5.1 mmol/L Final   Chloride 11/20/2023 102  98 - 111 mmol/L Final   CO2 11/20/2023 25  22 - 32 mmol/L Final   Glucose, Bld 11/20/2023 89  70 - 99 mg/dL Final   Glucose reference range applies only to samples taken after fasting for at least 8 hours.   BUN 11/20/2023 9  6 - 20 mg/dL Final   Creatinine, Ser 11/20/2023 0.87  0.44 - 1.00 mg/dL Final   Calcium  11/20/2023 9.5  8.9 - 10.3 mg/dL Final   Total Protein 40/98/1191 7.7  6.5 - 8.1 g/dL Final   Albumin 47/82/9562 3.5  3.5 - 5.0 g/dL Final   AST 13/03/6577 81 (H)  15 - 41 U/L Final   ALT 11/20/2023 60 (H)  0 - 44 U/L Final   Alkaline Phosphatase 11/20/2023 62  38 - 126 U/L Final   Total Bilirubin 11/20/2023 0.5  0.0 - 1.2 mg/dL Final   GFR, Estimated 11/20/2023 >60  >60 mL/min Final   Comment: (NOTE) Calculated using the CKD-EPI Creatinine Equation (2021)    Anion gap 11/20/2023 11  5 - 15 Final   Performed at [facility], [address] [place], [place] [address]   WBC 11/20/2023 6.2  4.0 - 10.5 K/uL Final   RBC 11/20/2023 4.02  3.87 - 5.11 MIL/uL Final   Hemoglobin 11/20/2023 12.8  12.0 - 15.0 g/dL Final   HCT 95/28/4132 39.0  36.0 - 46.0 % Final   MCV 11/20/2023 97.0  80.0 - 100.0 fL Final   MCH 11/20/2023 31.8  26.0 - 34.0 pg Final   MCHC 11/20/2023 32.8  30.0 - 36.0 g/dL Final   RDW 44/08/270 14.4  11.5 - 15.5 % Final   Platelets 11/20/2023 164  150 - 400 K/uL Final   nRBC 11/20/2023 0.0  0.0 - 0.2 % Final   Neutrophils Relative % 11/20/2023 53  % Final   Neutro Abs 11/20/2023 3.3  1.7 - 7.7 K/uL Final   Lymphocytes Relative 11/20/2023 33  % Final   Lymphs Abs 11/20/2023 2.1  0.7 - 4.0 K/uL Final   Monocytes Relative 11/20/2023 8  % Final   Monocytes Absolute 11/20/2023 0.5  0.1 - 1.0 K/uL Final   Eosinophils Relative 11/20/2023 5  % Final   Eosinophils Absolute 11/20/2023 0.3  0.0 - 0.5 K/uL Final   Basophils Relative 11/20/2023 1  % Final   Basophils Absolute 11/20/2023 0.1  0.0 - 0.1 K/uL Final   Immature Granulocytes 11/20/2023 0  % Final   Abs Immature Granulocytes 11/20/2023 0.02  0.00 - 0.07 K/uL Final   Performed at [facility], [address] [place], [place] [address]   Alcohol, Ethyl (B) 11/20/2023 144 (H)  <10 mg/dL Final   Comment: (NOTE) Lowest detectable limit for serum alcohol is 10 mg/dL.  For medical purposes only. Performed at [facility], [address], [place], [place] [address]    Acetaminophen  (Tylenol ), Serum 11/20/2023 <10 (L)  10 - 30 ug/mL Final   Comment: (NOTE) Therapeutic concentrations vary significantly. A range of 10-30 ug/mL  may be an effective concentration for many patients. However, some  are best treated at concentrations outside of this range. Acetaminophen  concentrations >150 ug/mL at 4 hours after ingestion  and >50  ug/mL at 12 hours after ingestion are often associated with  toxic reactions.  Performed at [facility], [address],  [place], [place] [address]    Specimen Source 11/20/2023 URINE, CLEAN CATCH   Final   Color, Urine 11/20/2023 YELLOW  YELLOW Final   APPearance 11/20/2023 HAZY (A)  CLEAR Final   Specific Gravity, Urine 11/20/2023 1.008  1.005 - 1.030 Final   pH 11/20/2023 6.0  5.0 - 8.0 Final   Glucose, UA 11/20/2023 NEGATIVE  NEGATIVE mg/dL Final   Hgb urine dipstick 11/20/2023 SMALL (A)  NEGATIVE Final   Bilirubin Urine 11/20/2023 NEGATIVE  NEGATIVE Final   Ketones, ur 11/20/2023 NEGATIVE  NEGATIVE mg/dL Final   Protein, ur 38/75/6433 NEGATIVE  NEGATIVE mg/dL Final   Nitrite 29/51/8841 NEGATIVE  NEGATIVE Final   Leukocytes,Ua 11/20/2023 NEGATIVE  NEGATIVE Final   RBC / HPF 11/20/2023 0-5  0 - 5 RBC/hpf Final   WBC, UA 11/20/2023 0-5  0 - 5 WBC/hpf Final   Comment:        Reflex urine culture not performed if WBC <=10, OR if Squamous epithelial cells >5. If Squamous epithelial cells >5 suggest recollection.    Bacteria, UA 11/20/2023 RARE (A)  NONE SEEN Final   Squamous Epithelial / HPF 11/20/2023 0-5  0 - 5 /HPF Final   Mucus 11/20/2023 PRESENT   Final   Performed at [facility], [address], [place], [place] [address]   Preg, Serum 11/20/2023 NEGATIVE  NEGATIVE Final   Comment:        THE SENSITIVITY OF THIS METHODOLOGY IS >10 mIU/mL. Performed at [facility], [address], [place], [place] [address]   Admission on 07/28/2023, Discharged on 07/28/2023  Component Date Value Ref Range Status   Sodium 07/28/2023 136  135 - 145 mmol/L Final   Potassium 07/28/2023 3.9  3.5 - 5.1 mmol/L Final   Chloride 07/28/2023 104  98 - 111 mmol/L Final   CO2 07/28/2023 22  22 - 32 mmol/L Final   Glucose, Bld 07/28/2023 90  70 - 99 mg/dL Final   Glucose reference range applies only to samples taken after fasting for at least 8 hours.   BUN 07/28/2023 10  6 - 20 mg/dL Final   Creatinine, Ser 07/28/2023 0.98  0.44 - 1.00 mg/dL Final   Calcium  07/28/2023 8.9  8.9 - 10.3 mg/dL Final   Total  Protein 07/28/2023 7.3  6.5 - 8.1 g/dL Final   Albumin 09/32/3557 3.1 (L)  3.5 - 5.0 g/dL Final   AST 32/20/2542 47 (H)  15 - 41 U/L Final   ALT 07/28/2023 35  0 - 44 U/L Final   Alkaline Phosphatase 07/28/2023 64  38 - 126 U/L Final   Total Bilirubin 07/28/2023 0.5  <1.2 mg/dL Final   GFR, Estimated 07/28/2023 >60  >60 mL/min Final   Comment: (NOTE) Calculated using the CKD-EPI Creatinine Equation (2021)    Anion gap 07/28/2023 10  5 - 15 Final   Performed at [facility], [address] [place], [place] [address]   Alcohol, Ethyl (B) 07/28/2023 <10  <10 mg/dL Final   Comment: (NOTE) Lowest detectable limit for serum alcohol is 10 mg/dL.  For medical purposes only. Performed at [facility], [address] [place], [place] [address]    Salicylate Lvl 07/28/2023 <7.0 (L)  7.0 - 30.0 mg/dL Final   Performed at [facility], [address], [place] [address]   Acetaminophen  (Tylenol ), Serum 07/28/2023 23  10 - 30 ug/mL Final   Comment: (NOTE) Therapeutic concentrations vary significantly. A range of 10-30 ug/mL  may be an effective concentration for many patients. However, some  are best treated at concentrations outside of this range. Acetaminophen  concentrations >150 ug/mL at 4 hours after ingestion  and >50 ug/mL at 12 hours after ingestion are often associated with  toxic reactions.  Performed at [facility], [address], [place], [place] [address]    WBC 07/28/2023 5.7  4.0 - 10.5 K/uL Final   RBC 07/28/2023 3.52 (L)  3.87 - 5.11 MIL/uL Final   Hemoglobin 07/28/2023 11.1 (L)  12.0 - 15.0 g/dL Final   HCT 69/62/9528 34.3 (L)  36.0 - 46.0 % Final   MCV 07/28/2023 97.4  80.0 - 100.0 fL Final   MCH 07/28/2023 31.5  26.0 - 34.0 pg Final   MCHC 07/28/2023 32.4  30.0 - 36.0 g/dL Final   RDW 41/32/4401 14.8  11.5 - 15.5 % Final   Platelets 07/28/2023 216  150 - 400 K/uL Final   nRBC 07/28/2023 0.0  0.0 - 0.2 % Final   Performed at [facility], [address], [place], [place] [address]   Opiates 07/28/2023 NONE DETECTED  NONE DETECTED Final   Cocaine 07/28/2023 NONE DETECTED  NONE DETECTED Final   Benzodiazepines 07/28/2023 NONE DETECTED  NONE DETECTED Final   Amphetamines 07/28/2023 NONE DETECTED  NONE DETECTED Final   Tetrahydrocannabinol 07/28/2023 NONE DETECTED  NONE DETECTED Final   Barbiturates 07/28/2023 NONE DETECTED  NONE DETECTED Final   Comment: (NOTE) DRUG SCREEN FOR MEDICAL PURPOSES ONLY.  IF CONFIRMATION IS NEEDED FOR ANY PURPOSE, NOTIFY LAB WITHIN 5 DAYS.  LOWEST DETECTABLE LIMITS FOR URINE DRUG SCREEN Drug Class                     Cutoff (ng/mL) Amphetamine  and metabolites    1000 Barbiturate and metabolites    200 Benzodiazepine                 200 Opiates and metabolites        300 Cocaine and metabolites        300 THC                            50 Performed at [facility] Lab, [address] [place], [place] [address]    Preg, Serum 07/28/2023 NEGATIVE  NEGATIVE Final   Comment:        THE SENSITIVITY OF THIS METHODOLOGY IS >10 mIU/mL. Performed at [facility], [address], [place], [place] [address]    Acetaminophen  (Tylenol ), Serum 07/28/2023 <10 (L)  10 - 30 ug/mL Final   Comment: (NOTE) Therapeutic concentrations vary significantly. A range of 10-30 ug/mL  may be an effective concentration for many patients. However, some  are best treated at concentrations outside of this range. Acetaminophen  concentrations >150 ug/mL at 4 hours after ingestion  and >50 ug/mL at 12 hours after ingestion are often associated with  toxic reactions.  Performed at [facility], [address], [place], [place] [address]   Admission on 07/26/2023, Discharged on 07/27/2023  Component Date Value Ref Range Status   Sodium 07/26/2023 137  135 - 145 mmol/L Final   Potassium 07/26/2023 4.5  3.5 - 5.1 mmol/L Final   Chloride 07/26/2023 106  98 - 111 mmol/L Final   CO2  07/26/2023 23  22 - 32 mmol/L Final   Glucose, Bld 07/26/2023 82  70 - 99 mg/dL Final   Glucose reference range applies only to samples taken after fasting for at least 8 hours.   BUN 07/26/2023 9  6 - 20 mg/dL Final   Creatinine, Ser 07/26/2023 0.96  0.44 - 1.00 mg/dL Final   Calcium  07/26/2023 8.9  8.9 - 10.3 mg/dL Final   Total Protein 98/06/9146 7.3  6.5 - 8.1 g/dL Final   Albumin 82/95/6213 3.1 (L)  3.5 - 5.0 g/dL Final   AST 08/65/7846 52 (H)  15 - 41 U/L Final   ALT 07/26/2023 37  0 - 44 U/L Final   Alkaline Phosphatase 07/26/2023 62  38 - 126 U/L Final   Total Bilirubin 07/26/2023 0.5  <1.2 mg/dL Final   GFR, Estimated 07/26/2023 >60  >60 mL/min Final   Comment: (NOTE) Calculated using the CKD-EPI Creatinine Equation (2021)    Anion gap 07/26/2023 8  5 - 15 Final   Performed at [facility], [address] [place], [place] [address]   Alcohol, Ethyl (B) 07/26/2023 <10  <10 mg/dL Final   Comment: (NOTE) Lowest detectable limit for serum alcohol is 10 mg/dL.  For medical purposes only. Performed at [facility], [address], [place], [place] [address]    WBC 07/26/2023 8.9  4.0 - 10.5 K/uL Final   RBC 07/26/2023 3.47 (L)  3.87 - 5.11 MIL/uL Final   Hemoglobin 07/26/2023 10.8 (L)  12.0 - 15.0 g/dL Final   HCT 24/40/1027 34.0 (L)  36.0 - 46.0 % Final   MCV 07/26/2023 98.0  80.0 - 100.0 fL Final   MCH 07/26/2023 31.1  26.0 - 34.0 pg Final   MCHC 07/26/2023 31.8  30.0 - 36.0 g/dL Final   RDW 25/36/6440 14.7  11.5 - 15.5 % Final   Platelets 07/26/2023 195  150 - 400 K/uL Final   nRBC 07/26/2023 0.0  0.0 - 0.2 % Final   Performed at [facility] Lab, [address], [place], [place] [address]   Preg, Serum 07/26/2023 NEGATIVE  NEGATIVE Final   Comment:        THE SENSITIVITY OF THIS METHODOLOGY IS >10 mIU/mL. Performed at [facility], [address] [place], [place] [address]    Opiates 07/26/2023 NONE DETECTED  NONE DETECTED Final   Cocaine  07/26/2023 NONE DETECTED  NONE DETECTED Final   Benzodiazepines 07/26/2023 NONE DETECTED  NONE DETECTED Final   Amphetamines 07/26/2023 NONE DETECTED  NONE DETECTED Final   Tetrahydrocannabinol 07/26/2023 NONE DETECTED  NONE DETECTED Final   Barbiturates 07/26/2023 NONE DETECTED  NONE DETECTED Final   Comment: (NOTE) DRUG SCREEN FOR MEDICAL PURPOSES ONLY.  IF CONFIRMATION IS NEEDED FOR ANY PURPOSE, NOTIFY LAB WITHIN 5 DAYS.  LOWEST DETECTABLE LIMITS FOR URINE DRUG SCREEN Drug Class                     Cutoff (ng/mL) Amphetamine  and metabolites    1000 Barbiturate and metabolites    200 Benzodiazepine                 200 Opiates and metabolites        300 Cocaine and metabolites        300 THC  50 Performed at [facility], [address], [place], [place] [address]   Admission on 07/22/2023, Discharged on 07/22/2023  Component Date Value Ref Range Status   SARS Coronavirus 2 by RT PCR 07/22/2023 NEGATIVE  NEGATIVE Final   Influenza A by PCR 07/22/2023 NEGATIVE  NEGATIVE Final   Influenza B by PCR 07/22/2023 NEGATIVE  NEGATIVE Final   Comment: (NOTE) The Xpert Xpress SARS-CoV-2/FLU/RSV plus assay is intended as an aid in the diagnosis of influenza from Nasopharyngeal swab specimens and should not be used as a sole basis for treatment. Nasal washings and aspirates are unacceptable for Xpert Xpress SARS-CoV-2/FLU/RSV testing.  Fact Sheet for Patients: BloggerCourse.com  Fact Sheet for Healthcare Providers: SeriousBroker.it  This test is not yet approved or cleared by the [place]  FDA and has been authorized for detection and/or diagnosis of SARS-CoV-2 by FDA under an Emergency Use Authorization (EUA). This EUA will remain in effect (meaning this test can be used) for the duration of the COVID-19 declaration under Section 564(b)(1) of the Act, 21 U.S.C. section 360bbb-3(b)(1), unless the  authorization is terminated or revoked.     Resp Syncytial Virus by PCR 07/22/2023 NEGATIVE  NEGATIVE Final   Comment: (NOTE) Fact Sheet for Patients: BloggerCourse.com  Fact Sheet for Healthcare Providers: SeriousBroker.it  This test is not yet approved or cleared by the [place]  FDA and has been authorized for detection and/or diagnosis of SARS-CoV-2 by FDA under an Emergency Use Authorization (EUA). This EUA will remain in effect (meaning this test can be used) for the duration of the COVID-19 declaration under Section 564(b)(1) of the Act, 21 U.S.C. section 360bbb-3(b)(1), unless the authorization is terminated or revoked.  Performed at [facility], [address], [place], [place] [address]    Sodium 07/22/2023 135  135 - 145 mmol/L Final   Potassium 07/22/2023 4.3  3.5 - 5.1 mmol/L Final   Chloride 07/22/2023 106  98 - 111 mmol/L Final   CO2 07/22/2023 22  22 - 32 mmol/L Final   Glucose, Bld 07/22/2023 111 (H)  70 - 99 mg/dL Final   Glucose reference range applies only to samples taken after fasting for at least 8 hours.   BUN 07/22/2023 12  6 - 20 mg/dL Final   Creatinine, Ser 07/22/2023 0.85  0.44 - 1.00 mg/dL Final   Calcium  07/22/2023 9.3  8.9 - 10.3 mg/dL Final   Total Protein 28/41/3244 6.8  6.5 - 8.1 g/dL Final   Albumin 08/24/7251 3.0 (L)  3.5 - 5.0 g/dL Final   AST 66/44/0347 36  15 - 41 U/L Final   ALT 07/22/2023 26  0 - 44 U/L Final   Alkaline Phosphatase 07/22/2023 56  38 - 126 U/L Final   Total Bilirubin 07/22/2023 0.5  <1.2 mg/dL Final   GFR, Estimated 07/22/2023 >60  >60 mL/min Final   Comment: (NOTE) Calculated using the CKD-EPI Creatinine Equation (2021)    Anion gap 07/22/2023 7  5 - 15 Final   Performed at [facility], [address], [place], [place] [address]   WBC 07/22/2023 7.4  4.0 - 10.5 K/uL Final   RBC 07/22/2023 3.42 (L)  3.87 - 5.11 MIL/uL Final   Hemoglobin 07/22/2023  10.6 (L)  12.0 - 15.0 g/dL Final   HCT 63/87/5643 33.5 (L)  36.0 - 46.0 % Final   MCV 07/22/2023 98.0  80.0 - 100.0 fL Final   MCH 07/22/2023 31.0  26.0 - 34.0 pg Final  MCHC 07/22/2023 31.6  30.0 - 36.0 g/dL Final   RDW 57/84/6962 15.2  11.5 - 15.5 % Final   Platelets 07/22/2023 158  150 - 400 K/uL Final   nRBC 07/22/2023 0.0  0.0 - 0.2 % Final   Performed at [facility], [address], [place], [place] [address]   Preg, Serum 07/22/2023 NEGATIVE  NEGATIVE Final   Comment:        THE SENSITIVITY OF THIS METHODOLOGY IS >10 mIU/mL. Performed at [facility], [address], [place], [place] [address]    Total CK 07/22/2023 268 (H)  38 - 234 U/L Final   Performed at [facility], [address], [place], [place] [address]   Color, Urine 07/22/2023 STRAW (A)  YELLOW Final   APPearance 07/22/2023 HAZY (A)  CLEAR Final   Specific Gravity, Urine 07/22/2023 1.005  1.005 - 1.030 Final   pH 07/22/2023 6.0  5.0 - 8.0 Final   Glucose, UA 07/22/2023 NEGATIVE  NEGATIVE mg/dL Final   Hgb urine dipstick 07/22/2023 SMALL (A)  NEGATIVE Final   Bilirubin Urine 07/22/2023 NEGATIVE  NEGATIVE Final   Ketones, ur 07/22/2023 NEGATIVE  NEGATIVE mg/dL Final   Protein, ur 25/36/6440 NEGATIVE  NEGATIVE mg/dL Final   Nitrite 34/74/2595 NEGATIVE  NEGATIVE Final   Leukocytes,Ua 07/22/2023 LARGE (A)  NEGATIVE Final   RBC / HPF 07/22/2023 0-5  0 - 5 RBC/hpf Final   WBC, UA 07/22/2023 6-10  0 - 5 WBC/hpf Final   Bacteria, UA 07/22/2023 RARE (A)  NONE SEEN Final   Squamous Epithelial / HPF 07/22/2023 0-5  0 - 5 /HPF Final   Performed at [facility], [address] [place], [place] [address]   Alcohol, Ethyl (B) 07/22/2023 <10  <10 mg/dL Final   Comment: (NOTE) Lowest detectable limit for serum alcohol is 10 mg/dL.  For medical purposes only. Performed at [facility], [address], [place], [place] [address]   Admission on 06/21/2023, Discharged on 06/26/2023  Component  Date Value Ref Range Status   Sodium 06/24/2023 133 (L)  135 - 145 mmol/L Final   Potassium 06/24/2023 4.1  3.5 - 5.1 mmol/L Final   Chloride 06/24/2023 99  98 - 111 mmol/L Final   CO2 06/24/2023 24  22 - 32 mmol/L Final   Glucose, Bld 06/24/2023 110 (H)  70 - 99 mg/dL Final   Glucose reference range applies only to samples taken after fasting for at least 8 hours.   BUN 06/24/2023 17  6 - 20 mg/dL Final   Creatinine, Ser 06/24/2023 0.85  0.44 - 1.00 mg/dL Final   Calcium  06/24/2023 9.2  8.9 - 10.3 mg/dL Final   Total Protein 95/18/8416 7.2  6.5 - 8.1 g/dL Final   Albumin 60/63/0160 2.9 (L)  3.5 - 5.0 g/dL Final   AST 10/93/2355 45 (H)  15 - 41 U/L Final   ALT 06/24/2023 41  0 - 44 U/L Final   Alkaline Phosphatase 06/24/2023 59  38 - 126 U/L Final   Total Bilirubin 06/24/2023 0.3  0.3 - 1.2 mg/dL Final   GFR, Estimated 06/24/2023 >60  >60 mL/min Final   Comment: (NOTE) Calculated using the CKD-EPI Creatinine Equation (2021)    Anion gap 06/24/2023 10  5 - 15 Final   Performed at [facility], [address] [place], [place] [address]   Ferritin 06/24/2023 53  11 - 307 ng/mL Final   Performed at  [facility], [address] [place], [place] [address]   Color, Urine 06/25/2023 AMBER (A)  YELLOW Final   BIOCHEMICALS MAY BE AFFECTED BY COLOR   APPearance 06/25/2023 HAZY (A)  CLEAR Final   Specific Gravity, Urine 06/25/2023 1.024  1.005 - 1.030 Final   pH 06/25/2023 5.0  5.0 - 8.0 Final   Glucose, UA 06/25/2023 NEGATIVE  NEGATIVE mg/dL Final   Hgb urine dipstick 06/25/2023 LARGE (A)  NEGATIVE Final   Bilirubin Urine 06/25/2023 NEGATIVE  NEGATIVE Final   Ketones, ur 06/25/2023 NEGATIVE  NEGATIVE mg/dL Final   Protein, ur 78/29/5621 NEGATIVE  NEGATIVE mg/dL Final   Nitrite 30/86/5784 NEGATIVE  NEGATIVE Final   Leukocytes,Ua 06/25/2023 NEGATIVE  NEGATIVE Final   RBC / HPF 06/25/2023 >50  0 - 5 RBC/hpf Final   WBC, UA 06/25/2023 11-20  0 - 5 WBC/hpf Final   Bacteria, UA  06/25/2023 RARE (A)  NONE SEEN Final   Squamous Epithelial / HPF 06/25/2023 6-10  0 - 5 /HPF Final   Mucus 06/25/2023 PRESENT   Final   Performed at [facility], [address], [place], [place] [address]   Neisseria Gonorrhea 06/25/2023 Negative   Final   Chlamydia 06/25/2023 Negative   Final   Trichomonas 06/25/2023 Negative   Final   Comment 06/25/2023 Normal Reference Range Trichomonas - Negative   Final   Comment 06/25/2023 Normal Reference Ranger Chlamydia - Negative   Final   Comment 06/25/2023 Normal Reference Range Neisseria Gonorrhea - Negative   Final  Admission on 06/20/2023, Discharged on 06/21/2023  Component Date Value Ref Range Status   Sodium 06/20/2023 130 (L)  135 - 145 mmol/L Final   Potassium 06/20/2023 4.5  3.5 - 5.1 mmol/L Final   HEMOLYSIS AT THIS LEVEL MAY AFFECT RESULT   Chloride 06/20/2023 97 (L)  98 - 111 mmol/L Final   CO2 06/20/2023 22  22 - 32 mmol/L Final   Glucose, Bld 06/20/2023 109 (H)  70 - 99 mg/dL Final   Glucose reference range applies only to samples taken after fasting for at least 8 hours.   BUN 06/20/2023 17  6 - 20 mg/dL Final   Creatinine, Ser 06/20/2023 1.03 (H)  0.44 - 1.00 mg/dL Final   Calcium  06/20/2023 9.0  8.9 - 10.3 mg/dL Final   Total Protein 52/84/1324 8.5 (H)  6.5 - 8.1 g/dL Final   Albumin 40/05/2724 3.6  3.5 - 5.0 g/dL Final   AST 36/64/4034 63 (H)  15 - 41 U/L Final   HEMOLYSIS AT THIS LEVEL MAY AFFECT RESULT   ALT 06/20/2023 43  0 - 44 U/L Final   HEMOLYSIS AT THIS LEVEL MAY AFFECT RESULT   Alkaline Phosphatase 06/20/2023 67  38 - 126 U/L Final   Total Bilirubin 06/20/2023 1.6 (H)  0.3 - 1.2 mg/dL Final   HEMOLYSIS AT THIS LEVEL MAY AFFECT RESULT   GFR, Estimated 06/20/2023 >60  >60 mL/min Final   Comment: (NOTE) Calculated using the CKD-EPI Creatinine Equation (2021)    Anion gap 06/20/2023 11  5 - 15 Final   Performed at [facility], [address], [place], [place] [address]   WBC 06/20/2023  12.4 (H)  4.0 - 10.5 K/uL Final   RBC 06/20/2023 3.75 (L)  3.87 - 5.11 MIL/uL Final   Hemoglobin 06/20/2023 12.0  12.0 - 15.0 g/dL Final   HCT 56/38/7564 36.5  36.0 - 46.0 % Final   MCV 06/20/2023 97.3  80.0 -  100.0 fL Final   MCH 06/20/2023 32.0  26.0 - 34.0 pg Final   MCHC 06/20/2023 32.9  30.0 - 36.0 g/dL Final   RDW 40/98/1191 13.2  11.5 - 15.5 % Final   Platelets 06/20/2023 254  150 - 400 K/uL Final   nRBC 06/20/2023 0.0  0.0 - 0.2 % Final   Performed at [facility], [address], [place], [place] [address]   Color, Urine 06/21/2023 YELLOW  YELLOW Final   APPearance 06/21/2023 CLEAR  CLEAR Final   Specific Gravity, Urine 06/21/2023 1.006  1.005 - 1.030 Final   pH 06/21/2023 6.0  5.0 - 8.0 Final   Glucose, UA 06/21/2023 NEGATIVE  NEGATIVE mg/dL Final   Hgb urine dipstick 06/21/2023 SMALL (A)  NEGATIVE Final   Bilirubin Urine 06/21/2023 NEGATIVE  NEGATIVE Final   Ketones, ur 06/21/2023 5 (A)  NEGATIVE mg/dL Final   Protein, ur 56/21/3086 NEGATIVE  NEGATIVE mg/dL Final   Nitrite 57/84/6962 NEGATIVE  NEGATIVE Final   Leukocytes,Ua 06/21/2023 LARGE (A)  NEGATIVE Final   RBC / HPF 06/21/2023 6-10  0 - 5 RBC/hpf Final   WBC, UA 06/21/2023 >50  0 - 5 WBC/hpf Final   Bacteria, UA 06/21/2023 MANY (A)  NONE SEEN Final   Squamous Epithelial / HPF 06/21/2023 0-5  0 - 5 /HPF Final   Hyaline Casts, UA 06/21/2023 PRESENT   Final   Performed at [facility], [address], [place], [place] [address]   Preg, Serum 06/20/2023 NEGATIVE  NEGATIVE Final   Comment:        THE SENSITIVITY OF THIS METHODOLOGY IS >10 mIU/mL. Performed at [facility], [address], [place], [place] [address]    Alcohol, Ethyl (B) 06/20/2023 <10  <10 mg/dL Final   Comment: (NOTE) Lowest detectable limit for serum alcohol is 10 mg/dL.  For medical purposes only. Performed at [facility], [address], [place], [place] [address]     Opiates 06/21/2023 NONE DETECTED  NONE DETECTED Final   Cocaine 06/21/2023 POSITIVE (A)  NONE DETECTED Final   Benzodiazepines 06/21/2023 NONE DETECTED  NONE DETECTED Final   Amphetamines 06/21/2023 NONE DETECTED  NONE DETECTED Final   Tetrahydrocannabinol 06/21/2023 NONE DETECTED  NONE DETECTED Final   Barbiturates 06/21/2023 NONE DETECTED  NONE DETECTED Final   Comment: (NOTE) DRUG SCREEN FOR MEDICAL PURPOSES ONLY.  IF CONFIRMATION IS NEEDED FOR ANY PURPOSE, NOTIFY LAB WITHIN 5 DAYS.  LOWEST DETECTABLE LIMITS FOR URINE DRUG SCREEN Drug Class                     Cutoff (ng/mL) Amphetamine  and metabolites    1000 Barbiturate and metabolites    200 Benzodiazepine                 200 Opiates and metabolites        300 Cocaine and metabolites        300 THC                            50 Performed at [facility], [address], [place], [place] [address]     Allergies: Gramineae pollens, Tylenol  [acetaminophen ], Lisinopril , and Mirtazapine   Medications:  Facility Ordered Medications  Medication   alum & mag hydroxide-simeth (MAALOX/MYLANTA) 200-200-20 MG/5ML suspension 30 mL   magnesium  hydroxide (MILK OF MAGNESIA) suspension 30 mL   haloperidol  (HALDOL ) tablet 5 mg  And   diphenhydrAMINE  (BENADRYL ) capsule 50 mg   haloperidol  lactate (HALDOL ) injection 5 mg   And   diphenhydrAMINE  (BENADRYL ) injection 50 mg   And   LORazepam  (ATIVAN ) injection 2 mg   haloperidol  lactate (HALDOL ) injection 10 mg   And   diphenhydrAMINE  (BENADRYL ) injection 50 mg   And   LORazepam  (ATIVAN ) injection 2 mg   hydrOXYzine  (ATARAX ) tablet 25 mg   traZODone  (DESYREL ) tablet 50 mg   [COMPLETED] thiamine  (VITAMIN B1) injection 100 mg   thiamine  (VITAMIN B1) tablet 100 mg   multivitamin with minerals tablet 1 tablet   LORazepam  (ATIVAN ) tablet 1 mg   loperamide  (IMODIUM ) capsule 2-4 mg   ondansetron  (ZOFRAN -ODT) disintegrating tablet 4 mg   dicyclomine  (BENTYL ) tablet 20  mg   loperamide  (IMODIUM ) capsule 2-4 mg   methocarbamol  (ROBAXIN ) tablet 500 mg   naproxen  (NAPROSYN ) tablet 500 mg   ondansetron  (ZOFRAN -ODT) disintegrating tablet 4 mg    Long Term Goals: Improvement in symptoms so as ready for discharge  Short Term Goals: Patient will verbalize feelings in meetings with treatment team members., Patient will attend at least of 50% of the groups daily., Pt will complete the PHQ9 on admission, day 3 and discharge., Patient will score a low risk of violence for 24 hours prior to discharge, and Patient will take medications as prescribed daily.  Medical Decision Making  Patient is admitted to locked unit at facility with crisis center Will start lithium  300 mg by mouth twice daily to help target manic and depressive symptoms Continue Trazodone  PRN at bed time for insomnia Will monitor for detox symptoms, will detox using CIWA and COWS protocol Patient was encouraged to keep herself hydrated Patient was encouraged to attend group and work on coping strategies and sobriety Social worker has been consulted to help with a safe discharge and discuss rehab options with the patient    Recommendations  Based on my evaluation the patient does not appear to have an emergency medical condition.  Silas Drivers, MD

## 2023-12-15 NOTE — Group Note (Signed)
 Group Topic: Relapse and Recovery  Group Date: 12/14/2023 Start Time: 2000 End Time: 2100 Facilitators: Soila Dunnings  Department: [facility]  Number of Participants: 3  Group Focus: relapse prevention and self-awareness Treatment Modality:  Leisure Development Interventions utilized were reminiscence, story telling, and support Purpose: enhance coping skills, express feelings, increase insight, and relapse prevention strategies  Name: Judy Robles Date of Birth: [birth year]  MR: [id number]    Level of Participation: pt did not attend group  Quality of Participation: pt did not attend group  Interactions with others: minimal and positive  Mood/Affect: appropriate and positive Triggers (if applicable): n/a Cognition: coherent/clear Progress: Gaining insight Response: pt left the dayroom at the start of group and wanted to rest Plan: patient will be encouraged to attend groups 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   MDD (major depressive disorder), severe (HCC) 12/18/2018   HCAP (healthcare-associated pneumonia) 09/16/2018   LLL pneumonia 09/16/2018   Sepsis (HCC) 09/16/2018   Hyponatremia 09/16/2018   Hypokalemia 09/16/2018   Severe benzodiazepine use disorder (HCC) 07/10/2018   Bipolar II disorder (HCC)    Severe recurrent major depression without psychotic features (HCC) 06/05/2018   PTSD (post-traumatic stress disorder) 11/21/2017   Major depressive disorder, recurrent episode, moderate (HCC) 11/05/2017   Community acquired pneumonia of right lower lobe of lung 09/02/2017   Sepsis associated  hypotension (HCC) 09/02/2017   AKI (acute kidney injury) (HCC) 09/02/2017   Nausea and vomiting 09/02/2017   Drug overdose 04/22/2017   Chest pain 10/09/2016   Pressure injury of skin 10/05/2016   Polysubstance dependence including opioid type drug with complication, episodic abuse (HCC) 07/21/2016   MDD (major depressive disorder), recurrent severe, without psychosis (HCC) 07/21/2016   Chronic pain 07/03/2015   Diarrhea 05/01/2014   Primary hypertension 04/17/2014

## 2023-12-15 NOTE — Care Management (Signed)
 [facility] Care Management   Writer met with patient and discussed discharge planning.  Patient requests inpatient substance abuse treatment.  Patient reports that she is a resident of [facility].  Patient reports that her drug of choice is alcohol.     Patient has [employer].  Therefore, the patient will be referred to [facility], [facility] and [facility]

## 2023-12-15 NOTE — Care Management (Signed)
 [facility] Care Management   Patient has been accepted to [facility].  The patient is able to go to the facility on Sunday 12-17-2023 at 9:30am   The patient will need a 7 day supply of sample medication and a prescription for her medication.   The address to the facility is  [address]  [place], [place] [address]  The phone number is [phone].

## 2023-12-15 NOTE — ED Notes (Signed)
 Patient  is sleeping comfortably on the bed with eyes closed. NAD, Respirations even and unlabored. Will continue to monitor for safety.

## 2023-12-15 NOTE — ED Notes (Signed)
 PT was given lunch

## 2023-12-15 NOTE — Group Note (Signed)
 Group Topic: Social Support  Group Date: 12/15/2023 Start Time: 1010 End Time: 1030 Facilitators: Pllc, Kellin; Haliegh Khurana B, NT  Department: [facility]  Number of Participants: 6  Group Focus: safety plan Treatment Modality:  Psychoeducation Interventions utilized were support Purpose: relapse prevention strategies  Name: Judy Robles Date of Birth: 09-29-1975  MR: [id number]    Level of Participation: minimal Quality of Participation: drowsy and quiet Interactions with others: PT was quite, didn't talk much Mood/Affect: Sleepy, bored Triggers (if applicable): n/a Cognition: not focused Progress: None Response: PT mentioned that she wanted to go to [facility] facility Plan: follow-up needed and patient will be encouraged to attend group  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   MDD (major depressive disorder), severe (HCC) 12/18/2018   HCAP (healthcare-associated pneumonia) 09/16/2018   LLL pneumonia 09/16/2018   Sepsis (HCC) 09/16/2018   Hyponatremia 09/16/2018   Hypokalemia 09/16/2018   Severe benzodiazepine use disorder (HCC) 07/10/2018   Bipolar II disorder (HCC)    Severe recurrent major depression without psychotic features (HCC) 06/05/2018   PTSD (post-traumatic stress disorder) 11/21/2017   Major depressive disorder, recurrent episode, moderate (HCC) 11/05/2017   Community acquired pneumonia of right lower lobe of lung 09/02/2017   Sepsis associated hypotension (HCC) 09/02/2017   AKI (acute kidney injury) (HCC) 09/02/2017   Nausea and vomiting 09/02/2017   Drug overdose  04/22/2017   Chest pain 10/09/2016   Pressure injury of skin 10/05/2016   Polysubstance dependence including opioid type drug with complication, episodic abuse (HCC) 07/21/2016   MDD (major depressive disorder), recurrent severe, without psychosis (HCC) 07/21/2016   Chronic pain 07/03/2015   Diarrhea 05/01/2014   Primary hypertension 04/17/2014

## 2023-12-15 NOTE — Group Note (Signed)
 Group Topic: Overcoming Obstacles  Group Date: 12/15/2023 Start Time: 1300 End Time: 1320 Facilitators: Arlan Belling, RN  Department: [facility]  Number of Participants: 8  Group Focus: coping skills Treatment Modality:  Behavior Modification Therapy Interventions utilized were patient education Purpose: enhance coping skills  Name: Judy Robles Date of Birth: December 07, 1975  MR: [id number]    Level of Participation: did not attend Quality of Participation:  Interactions with others:  Mood/Affect:  Triggers (if applicable):  Cognition:  Progress:  Response:  Plan:   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   MDD (major depressive disorder), severe (HCC) 12/18/2018   HCAP (healthcare-associated pneumonia) 09/16/2018   LLL pneumonia 09/16/2018   Sepsis (HCC) 09/16/2018   Hyponatremia 09/16/2018   Hypokalemia 09/16/2018   Severe benzodiazepine use disorder (HCC) 07/10/2018   Bipolar II disorder (HCC)    Severe recurrent major depression without psychotic features (HCC) 06/05/2018   PTSD (post-traumatic stress disorder) 11/21/2017   Major depressive disorder, recurrent episode, moderate (HCC) 11/05/2017   Community acquired pneumonia of right lower lobe of lung 09/02/2017   Sepsis associated hypotension (HCC) 09/02/2017   AKI (acute kidney injury) (HCC) 09/02/2017   Nausea and vomiting 09/02/2017   Drug overdose 04/22/2017   Chest pain 10/09/2016   Pressure injury of skin 10/05/2016   Polysubstance dependence including opioid type drug with complication, episodic abuse (HCC) 07/21/2016    MDD (major depressive disorder), recurrent severe, without psychosis (HCC) 07/21/2016   Chronic pain 07/03/2015   Diarrhea 05/01/2014   Primary hypertension 04/17/2014

## 2023-12-15 NOTE — ED Notes (Signed)
 Patient accepted to [facility] for admission Sunday

## 2023-12-15 NOTE — ED Notes (Signed)
Pt A&O x 4, no distress noted, resting at present.  Monitoring for safety. ?

## 2023-12-15 NOTE — ED Notes (Signed)
 Pt sleeping at present, no distress noted.  Monitoring for safety.

## 2023-12-16 DIAGNOSIS — F1910 Other psychoactive substance abuse, uncomplicated: Secondary | ICD-10-CM | POA: Diagnosis not present

## 2023-12-16 DIAGNOSIS — F3181 Bipolar II disorder: Secondary | ICD-10-CM | POA: Diagnosis not present

## 2023-12-16 DIAGNOSIS — Z5900 Homelessness unspecified: Secondary | ICD-10-CM | POA: Diagnosis not present

## 2023-12-16 DIAGNOSIS — F4310 Post-traumatic stress disorder, unspecified: Secondary | ICD-10-CM | POA: Diagnosis not present

## 2023-12-16 MED ORDER — TRAZODONE HCL 50 MG PO TABS: 50.0000 mg | ORAL_TABLET | Freq: Every evening | ORAL | 0 refills | Status: DC | PRN

## 2023-12-16 MED ORDER — HYDROXYZINE HCL 25 MG PO TABS: 25.0000 mg | ORAL_TABLET | Freq: Three times a day (TID) | ORAL | 1 refills | Status: DC | PRN

## 2023-12-16 MED ORDER — ADULT MULTIVITAMIN W/MINERALS CH: 1.0000 | ORAL_TABLET | Freq: Every day | ORAL | 1 refills | Status: DC

## 2023-12-16 MED ORDER — LITHIUM CARBONATE 300 MG PO CAPS: 300.0000 mg | ORAL_CAPSULE | Freq: Two times a day (BID) | ORAL | 1 refills | Status: DC

## 2023-12-16 NOTE — Group Note (Signed)
 Group Topic: Healthy Self Image and Positive Change  Group Date: 12/16/2023 Start Time: 1700 End Time: 1730 Facilitators: Elfredia Grippe, NT  Department: [facility]  Number of Participants: 9  Group Focus: affirmation Treatment Modality:  Psychoeducation Interventions utilized were group exercise Purpose: regain self-worth  Name: Judy Robles Date of Birth: 12-May-1976  MR: [id number]    Level of Participation: minimal Quality of Participation: attentive Interactions with others: gave feedback Mood/Affect: appropriate Triggers (if applicable): n/a Cognition: concrete Progress: Moderate Response: pt stated that her positive traits are being loyal, trusting and down to earth Plan: follow-up needed  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   MDD (major depressive disorder), severe (HCC) 12/18/2018   HCAP (healthcare-associated pneumonia) 09/16/2018   LLL pneumonia 09/16/2018   Sepsis (HCC) 09/16/2018   Hyponatremia 09/16/2018   Hypokalemia 09/16/2018   Severe benzodiazepine use disorder (HCC) 07/10/2018   Bipolar II disorder (HCC)    Severe recurrent major depression without psychotic features (HCC) 06/05/2018   PTSD (post-traumatic stress disorder) 11/21/2017   Major depressive disorder, recurrent episode, moderate (HCC) 11/05/2017   Community acquired pneumonia of right lower lobe of lung 09/02/2017   Sepsis associated hypotension (HCC) 09/02/2017   AKI (acute kidney injury) (HCC) 09/02/2017   Nausea and vomiting 09/02/2017   Drug overdose 04/22/2017   Chest pain 10/09/2016   Pressure  injury of skin 10/05/2016   Polysubstance dependence including opioid type drug with complication, episodic abuse (HCC) 07/21/2016   MDD (major depressive disorder), recurrent severe, without psychosis (HCC) 07/21/2016   Chronic pain 07/03/2015   Diarrhea 05/01/2014   Primary hypertension 04/17/2014

## 2023-12-16 NOTE — Group Note (Signed)
 Group Topic: Relapse and Recovery  Group Date: 12/16/2023 Start Time: 2000 End Time: 2100 Facilitators: Wendall Halls B  Department: [facility]  Number of Participants: 7  Group Focus: abuse issues, activities of daily living skills, chemical dependency education, chemical dependency issues, community group, dual diagnosis, and goals/reality orientation Treatment Modality:  Leisure Counsellor and Psychoeducation Interventions utilized were leisure development, patient education, story telling, and support Purpose: enhance coping skills, express feelings, increase insight, relapse prevention strategies, and trigger / craving management  Name: Judy Robles Date of Birth: 06-11-1976  MR: [id number]    Level of Participation: PT DID NOT ATTEND GROUP Quality of Participation: cooperative Interactions with others: gave feedback Mood/Affect: appropriate Triggers (if applicable): NA Cognition: coherent/clear Progress: None Response: NA Plan: patient will be encouraged to go to groups.   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   MDD (major depressive disorder), severe (HCC) 12/18/2018   HCAP (healthcare-associated pneumonia) 09/16/2018   LLL pneumonia 09/16/2018   Sepsis (HCC) 09/16/2018   Hyponatremia 09/16/2018   Hypokalemia 09/16/2018   Severe benzodiazepine use disorder (HCC) 07/10/2018   Bipolar II disorder (HCC)    Severe recurrent major depression without psychotic features (HCC) 06/05/2018   PTSD (post-traumatic stress disorder) 11/21/2017   Major depressive disorder, recurrent episode,  moderate (HCC) 11/05/2017   Community acquired pneumonia of right lower lobe of lung 09/02/2017   Sepsis associated hypotension (HCC) 09/02/2017   AKI (acute kidney injury) (HCC) 09/02/2017   Nausea and vomiting 09/02/2017   Drug overdose 04/22/2017   Chest pain 10/09/2016   Pressure injury of skin 10/05/2016   Polysubstance dependence including opioid type drug with complication, episodic abuse (HCC) 07/21/2016   MDD (major depressive disorder), recurrent severe, without psychosis (HCC) 07/21/2016   Chronic pain 07/03/2015   Diarrhea 05/01/2014   Primary hypertension 04/17/2014

## 2023-12-16 NOTE — ED Notes (Signed)
 Patient is in the bedroom sleeping with eyes closed, NAD. Will continue to monitor for safety.

## 2023-12-16 NOTE — ED Notes (Signed)
 Patient is in the bedroom, calm and sleeping with eyes closed.  NAD respirations are even and unlabored.Will continue to monitor for safety.

## 2023-12-16 NOTE — ED Provider Notes (Signed)
 Behavioral Health Progress Note  Date and Time: 12/16/2023 2:23 PM Name: Judy Robles MRN:  [id number]   Judy Robles is a 48 y.o. female patient with a past psychiatric history significant for polysubstance abuse (opiates, cocaine, cannabis), alcohol use disorder, bipolar 2 disorder, M   Subjective: Chart reviewed, case discussed with staff, patient seen during rounds.  Patient continues to report depressed mood, although she reports her sleep and appetite has improved.  She denies thoughts of harming herself or others.  She has been attending groups and participating in milieu.  Social worker shared that patient has been accepted at [facility], this was discussed with patient.  Patient feels hopeful with this news.  Patient was encouraged to work on her sobriety and abstain from illicit drugs or alcohol.  Patient was also educated about blood work related to lithium .  Patient was advised to get lithium  level,  BUN and creatinine levels within 2 to 3 days upon discharge.  Patient denies side effects from lithium .  She denies manic or psychotic symptoms.    Diagnosis:  Final diagnoses:  Polysubstance dependence including opioid type drug with complication, episodic abuse (HCC) [F19.20]  Bipolar II disorder (HCC) [F31.81]  PTSD (post-traumatic stress disorder) [F43.10]    Total Time spent with patient: 30 minutes  Past Psychiatric History: She denies outpatient psychiatry or counseling. She reports that in the past she was prescribed gabapentin , Seroquel, and Zoloft . She reports that she has not taken her medications in the past 6 months. She reports a psychiatric history of manic depression, anxiety and ADHD.  Patient reports to drinking alcohol since age 51 years old. hs. She reports drinking alcohol a lot, she reports drinking a 5th of liquor along with beer. She estimates drinking (3) 4 locos per day on average. She denies a history of alcohol withdrawal seizures or delirium tremens. She  states that she last received substance abuse treatment a couple months ago in [place] for 3 days. She reports using cocaine sporadically and states that she started using a few months ago. She reports last using cocaine 3-4 days ago.      Past Medical History: None reported by patient Family History: Patient reports her father has been diagnosed with bipolar disorder Social History: Patient reports she is homeless and has no source of income                  Sleep: Fair  Appetite:  Fair  Current Medications:  Current Facility-Administered Medications  Medication Dose Route Frequency Provider Last Rate Last Admin   alum & mag hydroxide-simeth (MAALOX/MYLANTA) 200-200-20 MG/5ML suspension 30 mL  30 mL Oral Q4H PRN White, Patrice L, NP       dicyclomine  (BENTYL ) tablet 20 mg  20 mg Oral Q6H PRN White, Patrice L, NP       haloperidol  (HALDOL ) tablet 5 mg  5 mg Oral TID PRN White, Patrice L, NP       And   diphenhydrAMINE  (BENADRYL ) capsule 50 mg  50 mg Oral TID PRN White, Patrice L, NP       haloperidol  lactate (HALDOL ) injection 5 mg  5 mg Intramuscular TID PRN White, Patrice L, NP       And   diphenhydrAMINE  (BENADRYL ) injection 50 mg  50 mg Intramuscular TID PRN White, Patrice L, NP       And   LORazepam  (ATIVAN ) injection 2 mg  2 mg Intramuscular TID PRN Gwenlyn Lento, NP  haloperidol  lactate (HALDOL ) injection 10 mg  10 mg Intramuscular TID PRN White, Patrice L, NP       And   diphenhydrAMINE  (BENADRYL ) injection 50 mg  50 mg Intramuscular TID PRN White, Patrice L, NP       And   LORazepam  (ATIVAN ) injection 2 mg  2 mg Intramuscular TID PRN White, Patrice L, NP       hydrOXYzine  (ATARAX ) tablet 25 mg  25 mg Oral TID PRN White, Patrice L, NP   25 mg at 12/16/23 1610   lithium  carbonate capsule 300 mg  300 mg Oral BID WC Ruslan Mccabe, MD   300 mg at 12/16/23 9604   loperamide  (IMODIUM ) capsule 2-4 mg  2-4 mg Oral PRN White, Patrice L, NP       loperamide   (IMODIUM ) capsule 2-4 mg  2-4 mg Oral PRN White, Patrice L, NP       LORazepam  (ATIVAN ) tablet 1 mg  1 mg Oral Q6H PRN White, Patrice L, NP       magnesium  hydroxide (MILK OF MAGNESIA) suspension 30 mL  30 mL Oral Daily PRN White, Patrice L, NP       methocarbamol  (ROBAXIN ) tablet 500 mg  500 mg Oral Q8H PRN White, Patrice L, NP   500 mg at 12/16/23 0936   multivitamin with minerals tablet 1 tablet  1 tablet Oral Daily White, Patrice L, NP   1 tablet at 12/16/23 5409   ondansetron  (ZOFRAN -ODT) disintegrating tablet 4 mg  4 mg Oral Q6H PRN White, Patrice L, NP       ondansetron  (ZOFRAN -ODT) disintegrating tablet 4 mg  4 mg Oral Q6H PRN White, Patrice L, NP       thiamine  (VITAMIN B1) tablet 100 mg  100 mg Oral Daily White, Patrice L, NP   100 mg at 12/16/23 0936   traZODone  (DESYREL ) tablet 50 mg  50 mg Oral QHS PRN White, Patrice L, NP   50 mg at 12/15/23 2121   Current Outpatient Medications  Medication Sig Dispense Refill   amLODipine  (NORVASC ) 10 MG tablet Take 10 mg by mouth daily.     B Complex Vitamins (VITAMIN B COMPLEX PO) Take 1 tablet by mouth daily.     cholecalciferol (VITAMIN D3) 25 MCG (1000 UNIT) tablet Take 1,000 Units by mouth daily.     gabapentin  (NEURONTIN ) 400 MG capsule Take 400 mg by mouth 4 (four) times daily.     QUEtiapine (SEROQUEL) 50 MG tablet Take 50 mg by mouth at bedtime.      Labs  Lab Results:  Admission on 12/14/2023  Component Date Value Ref Range Status   WBC 12/15/2023 4.4  4.0 - 10.5 K/uL Final   RBC 12/15/2023 3.54 (L)  3.87 - 5.11 MIL/uL Final   Hemoglobin 12/15/2023 11.3 (L)  12.0 - 15.0 g/dL Final   HCT 81/19/1478 34.7 (L)  36.0 - 46.0 % Final   MCV 12/15/2023 98.0  80.0 - 100.0 fL Final   MCH 12/15/2023 31.9  26.0 - 34.0 pg Final   MCHC 12/15/2023 32.6  30.0 - 36.0 g/dL Final   RDW 29/56/2130 14.2  11.5 - 15.5 % Final   Platelets 12/15/2023 150  150 - 400 K/uL Final   nRBC 12/15/2023 0.0  0.0 - 0.2 % Final   Neutrophils Relative %  12/15/2023 47  % Final   Neutro Abs 12/15/2023 2.1  1.7 - 7.7 K/uL Final   Lymphocytes Relative 12/15/2023 41  % Final  Lymphs Abs 12/15/2023 1.8  0.7 - 4.0 K/uL Final   Monocytes Relative 12/15/2023 8  % Final   Monocytes Absolute 12/15/2023 0.4  0.1 - 1.0 K/uL Final   Eosinophils Relative 12/15/2023 3  % Final   Eosinophils Absolute 12/15/2023 0.1  0.0 - 0.5 K/uL Final   Basophils Relative 12/15/2023 1  % Final   Basophils Absolute 12/15/2023 0.0  0.0 - 0.1 K/uL Final   Immature Granulocytes 12/15/2023 0  % Final   Abs Immature Granulocytes 12/15/2023 0.01  0.00 - 0.07 K/uL Final   Performed at [facility], [address] [place], [place] [address]   Sodium 12/15/2023 136  135 - 145 mmol/L Final   Potassium 12/15/2023 4.2  3.5 - 5.1 mmol/L Final   Chloride 12/15/2023 106  98 - 111 mmol/L Final   CO2 12/15/2023 21 (L)  22 - 32 mmol/L Final   Glucose, Bld 12/15/2023 77  70 - 99 mg/dL Final   Glucose reference range applies only to samples taken after fasting for at least 8 hours.   BUN 12/15/2023 23 (H)  6 - 20 mg/dL Final   Creatinine, Ser 12/15/2023 0.72  0.44 - 1.00 mg/dL Final   Calcium  12/15/2023 8.2 (L)  8.9 - 10.3 mg/dL Final   Total Protein 44/08/270 5.8 (L)  6.5 - 8.1 g/dL Final   Albumin 53/66/4403 2.5 (L)  3.5 - 5.0 g/dL Final   AST 47/42/5956 40  15 - 41 U/L Final   ALT 12/15/2023 36  0 - 44 U/L Final   Alkaline Phosphatase 12/15/2023 40  38 - 126 U/L Final   Total Bilirubin 12/15/2023 0.5  0.0 - 1.2 mg/dL Final   GFR, Estimated 12/15/2023 >60  >60 mL/min Final   Comment: (NOTE) Calculated using the CKD-EPI Creatinine Equation (2021)    Anion gap 12/15/2023 9  5 - 15 Final   Performed at [facility], [address], [place], [place] [address]   Hgb A1c MFr Bld 12/15/2023 4.9  4.8 - 5.6 % Final   Comment: (NOTE) Pre diabetes:          5.7%-6.4%  Diabetes:              >6.4%  Glycemic control for   <7.0% adults with diabetes    Mean Plasma Glucose  12/15/2023 93.93  mg/dL Final   Performed at [facility], [address], [place], [place] [address]   Alcohol, Ethyl (B) 12/15/2023 <15  <15 mg/dL Final   Comment: Please note change in reference range. (NOTE) For medical purposes only. Performed at [facility], [address], [place], [place] [address]    RPR Ser Ql 12/15/2023 NON REACTIVE  NON REACTIVE Final   Performed at [facility], [address], [place], [place] [address]   HIV Screen 4th Generation wRfx 12/15/2023 Non Reactive  Non Reactive Final   Performed at [facility], [address], [place], [place] [address]   Cholesterol 12/15/2023 133  0 - 200 mg/dL Final   Triglycerides 10/93/2355 34  <150 mg/dL Final   HDL 73/22/0254 57  >40 mg/dL Final   Total CHOL/HDL Ratio 12/15/2023 2.3  RATIO Final   VLDL 12/15/2023 7  0 - 40 mg/dL Final   LDL Cholesterol 12/15/2023 69  0 - 99 mg/dL Final   Comment:        Total Cholesterol/HDL:CHD Risk Coronary Heart Disease Risk Table  Men   Women  1/2 Average Risk   3.4   3.3  Average Risk       5.0   4.4  2 X Average Risk   9.6   7.1  3 X Average Risk  23.4   11.0        Use the calculated Patient Ratio above and the CHD Risk Table to determine the patient's CHD Risk.        ATP III CLASSIFICATION (LDL):  <100     mg/dL   Optimal  161-096  mg/dL   Near or Above                    Optimal  130-159  mg/dL   Borderline  045-409  mg/dL   High  >811     mg/dL   Very High Performed at [facility], [address] [place], [place] [address]    TSH 12/15/2023 2.653  0.350 - 4.500 uIU/mL Final   Comment: Performed by a 3rd Generation assay with a functional sensitivity of <=0.01 uIU/mL. Performed at [facility], [address] [place], [place] [address]   Admission on 12/14/2023, Discharged on 12/14/2023  Component Date Value Ref Range Status   Neisseria Gonorrhea 12/14/2023 Negative   Final   Chlamydia 12/14/2023  Negative   Final   Comment 12/14/2023 Normal Reference Ranger Chlamydia - Negative   Final   Comment 12/14/2023 Normal Reference Range Neisseria Gonorrhea - Negative   Final   Preg Test, Ur 12/14/2023 Negative  Negative Final   POC Amphetamine  UR 12/14/2023 None Detected  NONE DETECTED (Cut Off Level 1000 ng/mL) Final   POC Secobarbital (BAR) 12/14/2023 None Detected  NONE DETECTED (Cut Off Level 300 ng/mL) Final   POC Buprenorphine  (BUP) 12/14/2023 None Detected  NONE DETECTED (Cut Off Level 10 ng/mL) Final   POC Oxazepam (BZO) 12/14/2023 None Detected  NONE DETECTED (Cut Off Level 300 ng/mL) Final   POC Cocaine UR 12/14/2023 Positive (A)  NONE DETECTED (Cut Off Level 300 ng/mL) Final   POC Methamphetamine UR 12/14/2023 None Detected  NONE DETECTED (Cut Off Level 1000 ng/mL) Final   POC Morphine  12/14/2023 None Detected  NONE DETECTED (Cut Off Level 300 ng/mL) Final   POC Methadone UR 12/14/2023 None Detected  NONE DETECTED (Cut Off Level 300 ng/mL) Final   POC Oxycodone  UR 12/14/2023 Positive (A)  NONE DETECTED (Cut Off Level 100 ng/mL) Final   POC Marijuana UR 12/14/2023 Positive (A)  NONE DETECTED (Cut Off Level 50 ng/mL) Final  Admission on 11/20/2023, Discharged on 11/20/2023  Component Date Value Ref Range Status   Sodium 11/20/2023 138  135 - 145 mmol/L Final   Potassium 11/20/2023 3.5  3.5 - 5.1 mmol/L Final   Chloride 11/20/2023 102  98 - 111 mmol/L Final   CO2 11/20/2023 25  22 - 32 mmol/L Final   Glucose, Bld 11/20/2023 89  70 - 99 mg/dL Final   Glucose reference range applies only to samples taken after fasting for at least 8 hours.   BUN 11/20/2023 9  6 - 20 mg/dL Final   Creatinine, Ser 11/20/2023 0.87  0.44 - 1.00 mg/dL Final   Calcium  11/20/2023 9.5  8.9 - 10.3 mg/dL Final   Total Protein 13/03/6577 7.7  6.5 - 8.1 g/dL Final   Albumin 46/96/2952 3.5  3.5 - 5.0 g/dL Final   AST 84/13/2440 81 (H)  15 - 41 U/L Final   ALT 11/20/2023  60 (H)  0 - 44 U/L Final   Alkaline  Phosphatase 11/20/2023 62  38 - 126 U/L Final   Total Bilirubin 11/20/2023 0.5  0.0 - 1.2 mg/dL Final   GFR, Estimated 11/20/2023 >60  >60 mL/min Final   Comment: (NOTE) Calculated using the CKD-EPI Creatinine Equation (2021)    Anion gap 11/20/2023 11  5 - 15 Final   Performed at [facility], [address] [place], [place] [address]   WBC 11/20/2023 6.2  4.0 - 10.5 K/uL Final   RBC 11/20/2023 4.02  3.87 - 5.11 MIL/uL Final   Hemoglobin 11/20/2023 12.8  12.0 - 15.0 g/dL Final   HCT 63/08/6008 39.0  36.0 - 46.0 % Final   MCV 11/20/2023 97.0  80.0 - 100.0 fL Final   MCH 11/20/2023 31.8  26.0 - 34.0 pg Final   MCHC 11/20/2023 32.8  30.0 - 36.0 g/dL Final   RDW 93/23/5573 14.4  11.5 - 15.5 % Final   Platelets 11/20/2023 164  150 - 400 K/uL Final   nRBC 11/20/2023 0.0  0.0 - 0.2 % Final   Neutrophils Relative % 11/20/2023 53  % Final   Neutro Abs 11/20/2023 3.3  1.7 - 7.7 K/uL Final   Lymphocytes Relative 11/20/2023 33  % Final   Lymphs Abs 11/20/2023 2.1  0.7 - 4.0 K/uL Final   Monocytes Relative 11/20/2023 8  % Final   Monocytes Absolute 11/20/2023 0.5  0.1 - 1.0 K/uL Final   Eosinophils Relative 11/20/2023 5  % Final   Eosinophils Absolute 11/20/2023 0.3  0.0 - 0.5 K/uL Final   Basophils Relative 11/20/2023 1  % Final   Basophils Absolute 11/20/2023 0.1  0.0 - 0.1 K/uL Final   Immature Granulocytes 11/20/2023 0  % Final   Abs Immature Granulocytes 11/20/2023 0.02  0.00 - 0.07 K/uL Final   Performed at [facility], [address], [place], [place] [address]   Alcohol, Ethyl (B) 11/20/2023 144 (H)  <10 mg/dL Final   Comment: (NOTE) Lowest detectable limit for serum alcohol is 10 mg/dL.  For medical purposes only. Performed at [facility], [address] [place], [place] [address]    Acetaminophen  (Tylenol ), Serum 11/20/2023 <10 (L)  10 - 30 ug/mL Final   Comment: (NOTE) Therapeutic concentrations vary significantly. A range of 10-30 ug/mL  may be an  effective concentration for many patients. However, some  are best treated at concentrations outside of this range. Acetaminophen  concentrations >150 ug/mL at 4 hours after ingestion  and >50 ug/mL at 12 hours after ingestion are often associated with  toxic reactions.  Performed at [facility], [address] [place], [place] [address]    Specimen Source 11/20/2023 URINE, CLEAN CATCH   Final   Color, Urine 11/20/2023 YELLOW  YELLOW Final   APPearance 11/20/2023 HAZY (A)  CLEAR Final   Specific Gravity, Urine 11/20/2023 1.008  1.005 - 1.030 Final   pH 11/20/2023 6.0  5.0 - 8.0 Final   Glucose, UA 11/20/2023 NEGATIVE  NEGATIVE mg/dL Final   Hgb urine dipstick 11/20/2023 SMALL (A)  NEGATIVE Final   Bilirubin Urine 11/20/2023 NEGATIVE  NEGATIVE Final   Ketones, ur 11/20/2023 NEGATIVE  NEGATIVE mg/dL Final   Protein, ur 83/15/1761 NEGATIVE  NEGATIVE mg/dL Final   Nitrite 60/73/7106 NEGATIVE  NEGATIVE Final   Leukocytes,Ua 11/20/2023 NEGATIVE  NEGATIVE Final   RBC / HPF 11/20/2023 0-5  0 - 5 RBC/hpf Final   WBC,  UA 11/20/2023 0-5  0 - 5 WBC/hpf Final   Comment:        Reflex urine culture not performed if WBC <=10, OR if Squamous epithelial cells >5. If Squamous epithelial cells >5 suggest recollection.    Bacteria, UA 11/20/2023 RARE (A)  NONE SEEN Final   Squamous Epithelial / HPF 11/20/2023 0-5  0 - 5 /HPF Final   Mucus 11/20/2023 PRESENT   Final   Performed at [facility], [address], [place], [place] [address]   Preg, Serum 11/20/2023 NEGATIVE  NEGATIVE Final   Comment:        THE SENSITIVITY OF THIS METHODOLOGY IS >10 mIU/mL. Performed at [facility], [address] [place], [place] [address]   Admission on 07/28/2023, Discharged on 07/28/2023  Component Date Value Ref Range Status   Sodium 07/28/2023 136  135 - 145 mmol/L Final   Potassium 07/28/2023 3.9  3.5 - 5.1 mmol/L Final   Chloride 07/28/2023 104  98 - 111 mmol/L Final   CO2 07/28/2023  22  22 - 32 mmol/L Final   Glucose, Bld 07/28/2023 90  70 - 99 mg/dL Final   Glucose reference range applies only to samples taken after fasting for at least 8 hours.   BUN 07/28/2023 10  6 - 20 mg/dL Final   Creatinine, Ser 07/28/2023 0.98  0.44 - 1.00 mg/dL Final   Calcium  07/28/2023 8.9  8.9 - 10.3 mg/dL Final   Total Protein 65/78/4696 7.3  6.5 - 8.1 g/dL Final   Albumin 29/52/8413 3.1 (L)  3.5 - 5.0 g/dL Final   AST 24/40/1027 47 (H)  15 - 41 U/L Final   ALT 07/28/2023 35  0 - 44 U/L Final   Alkaline Phosphatase 07/28/2023 64  38 - 126 U/L Final   Total Bilirubin 07/28/2023 0.5  <1.2 mg/dL Final   GFR, Estimated 07/28/2023 >60  >60 mL/min Final   Comment: (NOTE) Calculated using the CKD-EPI Creatinine Equation (2021)    Anion gap 07/28/2023 10  5 - 15 Final   Performed at [facility], [address] [place], [place] [address]   Alcohol, Ethyl (B) 07/28/2023 <10  <10 mg/dL Final   Comment: (NOTE) Lowest detectable limit for serum alcohol is 10 mg/dL.  For medical purposes only. Performed at [facility], [address], [place], [place] [address]    Salicylate Lvl 07/28/2023 <7.0 (L)  7.0 - 30.0 mg/dL Final   Performed at [facility], [address] [place], [place] [address]   Acetaminophen  (Tylenol ), Serum 07/28/2023 23  10 - 30 ug/mL Final   Comment: (NOTE) Therapeutic concentrations vary significantly. A range of 10-30 ug/mL  may be an effective concentration for many patients. However, some  are best treated at concentrations outside of this range. Acetaminophen  concentrations >150 ug/mL at 4 hours after ingestion  and >50 ug/mL at 12 hours after ingestion are often associated with  toxic reactions.  Performed at [facility], [address] [place], [place] [address]    WBC 07/28/2023 5.7  4.0 - 10.5 K/uL Final   RBC 07/28/2023 3.52 (L)  3.87 - 5.11 MIL/uL Final   Hemoglobin 07/28/2023 11.1 (L)  12.0 - 15.0 g/dL Final   HCT 56/43/3295  34.3 (L)  36.0 - 46.0 % Final   MCV 07/28/2023 97.4  80.0 - 100.0 fL Final   MCH 07/28/2023 31.5  26.0 - 34.0 pg Final   MCHC  07/28/2023 32.4  30.0 - 36.0 g/dL Final   RDW 13/03/6577 14.8  11.5 - 15.5 % Final   Platelets 07/28/2023 216  150 - 400 K/uL Final   nRBC 07/28/2023 0.0  0.0 - 0.2 % Final   Performed at [facility], [address] [place], [place] [address]   Opiates 07/28/2023 NONE DETECTED  NONE DETECTED Final   Cocaine 07/28/2023 NONE DETECTED  NONE DETECTED Final   Benzodiazepines 07/28/2023 NONE DETECTED  NONE DETECTED Final   Amphetamines 07/28/2023 NONE DETECTED  NONE DETECTED Final   Tetrahydrocannabinol 07/28/2023 NONE DETECTED  NONE DETECTED Final   Barbiturates 07/28/2023 NONE DETECTED  NONE DETECTED Final   Comment: (NOTE) DRUG SCREEN FOR MEDICAL PURPOSES ONLY.  IF CONFIRMATION IS NEEDED FOR ANY PURPOSE, NOTIFY LAB WITHIN 5 DAYS.  LOWEST DETECTABLE LIMITS FOR URINE DRUG SCREEN Drug Class                     Cutoff (ng/mL) Amphetamine  and metabolites    1000 Barbiturate and metabolites    200 Benzodiazepine                 200 Opiates and metabolites        300 Cocaine and metabolites        300 THC                            50 Performed at [facility], [address], [place], [place] [address]    Preg, Serum 07/28/2023 NEGATIVE  NEGATIVE Final   Comment:        THE SENSITIVITY OF THIS METHODOLOGY IS >10 mIU/mL. Performed at [facility], [address], [place], [place] [address]    Acetaminophen  (Tylenol ), Serum 07/28/2023 <10 (L)  10 - 30 ug/mL Final   Comment: (NOTE) Therapeutic concentrations vary significantly. A range of 10-30 ug/mL  may be an effective concentration for many patients. However, some  are best treated at concentrations outside of this range. Acetaminophen  concentrations >150 ug/mL at 4 hours after ingestion  and >50 ug/mL at 12 hours after ingestion are often associated with  toxic reactions.  Performed  at [facility], [address] [place], [place] [address]   Admission on 07/26/2023, Discharged on 07/27/2023  Component Date Value Ref Range Status   Sodium 07/26/2023 137  135 - 145 mmol/L Final   Potassium 07/26/2023 4.5  3.5 - 5.1 mmol/L Final   Chloride 07/26/2023 106  98 - 111 mmol/L Final   CO2 07/26/2023 23  22 - 32 mmol/L Final   Glucose, Bld 07/26/2023 82  70 - 99 mg/dL Final   Glucose reference range applies only to samples taken after fasting for at least 8 hours.   BUN 07/26/2023 9  6 - 20 mg/dL Final   Creatinine, Ser 07/26/2023 0.96  0.44 - 1.00 mg/dL Final   Calcium  07/26/2023 8.9  8.9 - 10.3 mg/dL Final   Total Protein 25/36/6440 7.3  6.5 - 8.1 g/dL Final   Albumin 34/74/2595 3.1 (L)  3.5 - 5.0 g/dL Final   AST 63/87/5643 52 (H)  15 - 41 U/L Final   ALT 07/26/2023 37  0 - 44 U/L Final   Alkaline Phosphatase 07/26/2023 62  38 - 126 U/L Final   Total Bilirubin 07/26/2023 0.5  <1.2 mg/dL Final   GFR, Estimated 07/26/2023 >60  >60 mL/min Final  Comment: (NOTE) Calculated using the CKD-EPI Creatinine Equation (2021)    Anion gap 07/26/2023 8  5 - 15 Final   Performed at [facility], [address], [place], [place] [address]   Alcohol, Ethyl (B) 07/26/2023 <10  <10 mg/dL Final   Comment: (NOTE) Lowest detectable limit for serum alcohol is 10 mg/dL.  For medical purposes only. Performed at [facility], [address] [place], [place] [address]    WBC 07/26/2023 8.9  4.0 - 10.5 K/uL Final   RBC 07/26/2023 3.47 (L)  3.87 - 5.11 MIL/uL Final   Hemoglobin 07/26/2023 10.8 (L)  12.0 - 15.0 g/dL Final   HCT 52/84/1324 34.0 (L)  36.0 - 46.0 % Final   MCV 07/26/2023 98.0  80.0 - 100.0 fL Final   MCH 07/26/2023 31.1  26.0 - 34.0 pg Final   MCHC 07/26/2023 31.8  30.0 - 36.0 g/dL Final   RDW 40/05/2724 14.7  11.5 - 15.5 % Final   Platelets 07/26/2023 195  150 - 400 K/uL Final   nRBC 07/26/2023 0.0  0.0 - 0.2 % Final   Performed at [facility]  Lab, [address] [place], [place] [address]   Preg, Serum 07/26/2023 NEGATIVE  NEGATIVE Final   Comment:        THE SENSITIVITY OF THIS METHODOLOGY IS >10 mIU/mL. Performed at [facility], [address] [place], [place] [address]    Opiates 07/26/2023 NONE DETECTED  NONE DETECTED Final   Cocaine 07/26/2023 NONE DETECTED  NONE DETECTED Final   Benzodiazepines 07/26/2023 NONE DETECTED  NONE DETECTED Final   Amphetamines 07/26/2023 NONE DETECTED  NONE DETECTED Final   Tetrahydrocannabinol 07/26/2023 NONE DETECTED  NONE DETECTED Final   Barbiturates 07/26/2023 NONE DETECTED  NONE DETECTED Final   Comment: (NOTE) DRUG SCREEN FOR MEDICAL PURPOSES ONLY.  IF CONFIRMATION IS NEEDED FOR ANY PURPOSE, NOTIFY LAB WITHIN 5 DAYS.  LOWEST DETECTABLE LIMITS FOR URINE DRUG SCREEN Drug Class                     Cutoff (ng/mL) Amphetamine  and metabolites    1000 Barbiturate and metabolites    200 Benzodiazepine                 200 Opiates and metabolites        300 Cocaine and metabolites        300 THC                            50 Performed at [facility] Lab, [address], [place], [place] [address]   Admission on 07/22/2023, Discharged on 07/22/2023  Component Date Value Ref Range Status   SARS Coronavirus 2 by RT PCR 07/22/2023 NEGATIVE  NEGATIVE Final   Influenza A by PCR 07/22/2023 NEGATIVE  NEGATIVE Final   Influenza B by PCR 07/22/2023 NEGATIVE  NEGATIVE Final   Comment: (NOTE) The Xpert Xpress SARS-CoV-2/FLU/RSV plus assay is intended as an aid in the diagnosis of influenza from Nasopharyngeal swab specimens and should not be used as a sole basis for treatment. Nasal washings and aspirates are unacceptable for Xpert Xpress SARS-CoV-2/FLU/RSV testing.  Fact Sheet for Patients: BloggerCourse.com  Fact Sheet for Healthcare Providers: SeriousBroker.it  This test is not yet approved or cleared by the [place]  FDA  and has been authorized for detection and/or diagnosis of SARS-CoV-2 by FDA under an Emergency Use  Authorization (EUA). This EUA will remain in effect (meaning this test can be used) for the duration of the COVID-19 declaration under Section 564(b)(1) of the Act, 21 U.S.C. section 360bbb-3(b)(1), unless the authorization is terminated or revoked.     Resp Syncytial Virus by PCR 07/22/2023 NEGATIVE  NEGATIVE Final   Comment: (NOTE) Fact Sheet for Patients: BloggerCourse.com  Fact Sheet for Healthcare Providers: SeriousBroker.it  This test is not yet approved or cleared by the [place]  FDA and has been authorized for detection and/or diagnosis of SARS-CoV-2 by FDA under an Emergency Use Authorization (EUA). This EUA will remain in effect (meaning this test can be used) for the duration of the COVID-19 declaration under Section 564(b)(1) of the Act, 21 U.S.C. section 360bbb-3(b)(1), unless the authorization is terminated or revoked.  Performed at [facility], [address], [place], [place] [address]    Sodium 07/22/2023 135  135 - 145 mmol/L Final   Potassium 07/22/2023 4.3  3.5 - 5.1 mmol/L Final   Chloride 07/22/2023 106  98 - 111 mmol/L Final   CO2 07/22/2023 22  22 - 32 mmol/L Final   Glucose, Bld 07/22/2023 111 (H)  70 - 99 mg/dL Final   Glucose reference range applies only to samples taken after fasting for at least 8 hours.   BUN 07/22/2023 12  6 - 20 mg/dL Final   Creatinine, Ser 07/22/2023 0.85  0.44 - 1.00 mg/dL Final   Calcium  07/22/2023 9.3  8.9 - 10.3 mg/dL Final   Total Protein 19/14/7829 6.8  6.5 - 8.1 g/dL Final   Albumin 56/21/3086 3.0 (L)  3.5 - 5.0 g/dL Final   AST 57/84/6962 36  15 - 41 U/L Final   ALT 07/22/2023 26  0 - 44 U/L Final   Alkaline Phosphatase 07/22/2023 56  38 - 126 U/L Final   Total Bilirubin 07/22/2023 0.5  <1.2 mg/dL Final   GFR, Estimated 07/22/2023 >60  >60 mL/min Final    Comment: (NOTE) Calculated using the CKD-EPI Creatinine Equation (2021)    Anion gap 07/22/2023 7  5 - 15 Final   Performed at [facility], [address], [place], [place] [address]   WBC 07/22/2023 7.4  4.0 - 10.5 K/uL Final   RBC 07/22/2023 3.42 (L)  3.87 - 5.11 MIL/uL Final   Hemoglobin 07/22/2023 10.6 (L)  12.0 - 15.0 g/dL Final   HCT 13/24/4010 33.5 (L)  36.0 - 46.0 % Final   MCV 07/22/2023 98.0  80.0 - 100.0 fL Final   MCH 07/22/2023 31.0  26.0 - 34.0 pg Final   MCHC 07/22/2023 31.6  30.0 - 36.0 g/dL Final   RDW 27/25/3664 15.2  11.5 - 15.5 % Final   Platelets 07/22/2023 158  150 - 400 K/uL Final   nRBC 07/22/2023 0.0  0.0 - 0.2 % Final   Performed at [facility], [address] [place], [place] [address]   Preg, Serum 07/22/2023 NEGATIVE  NEGATIVE Final   Comment:        THE SENSITIVITY OF THIS METHODOLOGY IS >10 mIU/mL. Performed at [facility], [address], [place], [place] [address]    Total CK 07/22/2023 268 (H)  38 - 234 U/L Final   Performed at [facility], [address] [place], [place] [address]   Color, Urine 07/22/2023 STRAW (A)  YELLOW Final   APPearance 07/22/2023 HAZY (A)  CLEAR Final   Specific Gravity, Urine 07/22/2023 1.005  1.005 - 1.030 Final   pH 07/22/2023 6.0  5.0 - 8.0 Final   Glucose, UA 07/22/2023 NEGATIVE  NEGATIVE mg/dL Final   Hgb urine dipstick 07/22/2023 SMALL (A)  NEGATIVE Final   Bilirubin Urine 07/22/2023 NEGATIVE  NEGATIVE Final   Ketones, ur 07/22/2023 NEGATIVE  NEGATIVE mg/dL Final   Protein, ur 16/05/9603 NEGATIVE  NEGATIVE mg/dL Final   Nitrite 54/04/8118 NEGATIVE  NEGATIVE Final   Leukocytes,Ua 07/22/2023 LARGE (A)  NEGATIVE Final   RBC / HPF 07/22/2023 0-5  0 - 5 RBC/hpf Final   WBC, UA 07/22/2023 6-10  0 - 5 WBC/hpf Final   Bacteria, UA 07/22/2023 RARE (A)  NONE SEEN Final   Squamous Epithelial / HPF 07/22/2023 0-5  0 - 5 /HPF Final   Performed at [facility], [address],  [place], [place] [address]   Alcohol, Ethyl (B) 07/22/2023 <10  <10 mg/dL Final   Comment: (NOTE) Lowest detectable limit for serum alcohol is 10 mg/dL.  For medical purposes only. Performed at [facility], [address], [place], [place] [address]   Admission on 06/21/2023, Discharged on 06/26/2023  Component Date Value Ref Range Status   Sodium 06/24/2023 133 (L)  135 - 145 mmol/L Final   Potassium 06/24/2023 4.1  3.5 - 5.1 mmol/L Final   Chloride 06/24/2023 99  98 - 111 mmol/L Final   CO2 06/24/2023 24  22 - 32 mmol/L Final   Glucose, Bld 06/24/2023 110 (H)  70 - 99 mg/dL Final   Glucose reference range applies only to samples taken after fasting for at least 8 hours.   BUN 06/24/2023 17  6 - 20 mg/dL Final   Creatinine, Ser 06/24/2023 0.85  0.44 - 1.00 mg/dL Final   Calcium  06/24/2023 9.2  8.9 - 10.3 mg/dL Final   Total Protein 30/86/5784 7.2  6.5 - 8.1 g/dL Final   Albumin 69/62/9528 2.9 (L)  3.5 - 5.0 g/dL Final   AST 41/32/4401 45 (H)  15 - 41 U/L Final   ALT 06/24/2023 41  0 - 44 U/L Final   Alkaline Phosphatase 06/24/2023 59  38 - 126 U/L Final   Total Bilirubin 06/24/2023 0.3  0.3 - 1.2 mg/dL Final   GFR, Estimated 06/24/2023 >60  >60 mL/min Final   Comment: (NOTE) Calculated using the CKD-EPI Creatinine Equation (2021)    Anion gap 06/24/2023 10  5 - 15 Final   Performed at [facility], [address], [place], [place] [address]   Ferritin 06/24/2023 53  11 - 307 ng/mL Final   Performed at [facility], [address], [place], [place] [address]   Color, Urine 06/25/2023 AMBER (A)  YELLOW Final   BIOCHEMICALS MAY BE AFFECTED BY COLOR   APPearance 06/25/2023 HAZY (A)  CLEAR Final   Specific Gravity, Urine 06/25/2023 1.024  1.005 - 1.030 Final   pH 06/25/2023 5.0  5.0 - 8.0 Final   Glucose, UA 06/25/2023 NEGATIVE  NEGATIVE mg/dL Final   Hgb urine dipstick 06/25/2023 LARGE (A)  NEGATIVE Final   Bilirubin Urine 06/25/2023 NEGATIVE  NEGATIVE Final    Ketones, ur 06/25/2023 NEGATIVE  NEGATIVE mg/dL Final   Protein, ur 03/47/4259 NEGATIVE  NEGATIVE mg/dL Final   Nitrite 56/38/7564 NEGATIVE  NEGATIVE Final   Leukocytes,Ua 06/25/2023 NEGATIVE  NEGATIVE Final   RBC / HPF 06/25/2023 >50  0 - 5 RBC/hpf Final   WBC, UA 06/25/2023 11-20  0 - 5 WBC/hpf Final  Bacteria, UA 06/25/2023 RARE (A)  NONE SEEN Final   Squamous Epithelial / HPF 06/25/2023 6-10  0 - 5 /HPF Final   Mucus 06/25/2023 PRESENT   Final   Performed at [facility], [address] [place], [place] [address]   Neisseria Gonorrhea 06/25/2023 Negative   Final   Chlamydia 06/25/2023 Negative   Final   Trichomonas 06/25/2023 Negative   Final   Comment 06/25/2023 Normal Reference Range Trichomonas - Negative   Final   Comment 06/25/2023 Normal Reference [place] Chlamydia - Negative   Final   Comment 06/25/2023 Normal Reference Range Neisseria Gonorrhea - Negative   Final  Admission on 06/20/2023, Discharged on 06/21/2023  Component Date Value Ref Range Status   Sodium 06/20/2023 130 (L)  135 - 145 mmol/L Final   Potassium 06/20/2023 4.5  3.5 - 5.1 mmol/L Final   HEMOLYSIS AT THIS LEVEL MAY AFFECT RESULT   Chloride 06/20/2023 97 (L)  98 - 111 mmol/L Final   CO2 06/20/2023 22  22 - 32 mmol/L Final   Glucose, Bld 06/20/2023 109 (H)  70 - 99 mg/dL Final   Glucose reference range applies only to samples taken after fasting for at least 8 hours.   BUN 06/20/2023 17  6 - 20 mg/dL Final   Creatinine, Ser 06/20/2023 1.03 (H)  0.44 - 1.00 mg/dL Final   Calcium  06/20/2023 9.0  8.9 - 10.3 mg/dL Final   Total Protein 62/13/0865 8.5 (H)  6.5 - 8.1 g/dL Final   Albumin 78/46/9629 3.6  3.5 - 5.0 g/dL Final   AST 52/84/1324 63 (H)  15 - 41 U/L Final   HEMOLYSIS AT THIS LEVEL MAY AFFECT RESULT   ALT 06/20/2023 43  0 - 44 U/L Final   HEMOLYSIS AT THIS LEVEL MAY AFFECT RESULT   Alkaline Phosphatase 06/20/2023 67  38 - 126 U/L Final   Total Bilirubin 06/20/2023 1.6 (H)  0.3 - 1.2 mg/dL Final    HEMOLYSIS AT THIS LEVEL MAY AFFECT RESULT   GFR, Estimated 06/20/2023 >60  >60 mL/min Final   Comment: (NOTE) Calculated using the CKD-EPI Creatinine Equation (2021)    Anion gap 06/20/2023 11  5 - 15 Final   Performed at [facility], [address], [place], [place] [address]   WBC 06/20/2023 12.4 (H)  4.0 - 10.5 K/uL Final   RBC 06/20/2023 3.75 (L)  3.87 - 5.11 MIL/uL Final   Hemoglobin 06/20/2023 12.0  12.0 - 15.0 g/dL Final   HCT 72/53/6644 36.5  36.0 - 46.0 % Final   MCV 06/20/2023 97.3  80.0 - 100.0 fL Final   MCH 06/20/2023 32.0  26.0 - 34.0 pg Final   MCHC 06/20/2023 32.9  30.0 - 36.0 g/dL Final   RDW 03/47/4259 13.2  11.5 - 15.5 % Final   Platelets 06/20/2023 254  150 - 400 K/uL Final   nRBC 06/20/2023 0.0  0.0 - 0.2 % Final   Performed at [facility], [address] [place], [place] [address]   Color, Urine 06/21/2023 YELLOW  YELLOW Final   APPearance 06/21/2023 CLEAR  CLEAR Final   Specific Gravity, Urine 06/21/2023 1.006  1.005 - 1.030 Final   pH 06/21/2023 6.0  5.0 - 8.0 Final   Glucose, UA 06/21/2023 NEGATIVE  NEGATIVE mg/dL Final   Hgb urine dipstick 06/21/2023 SMALL (A)  NEGATIVE Final   Bilirubin Urine 06/21/2023 NEGATIVE  NEGATIVE Final   Ketones, ur 06/21/2023 5 (A)  NEGATIVE mg/dL Final  Protein, ur 06/21/2023 NEGATIVE  NEGATIVE mg/dL Final   Nitrite 24/40/1027 NEGATIVE  NEGATIVE Final   Leukocytes,Ua 06/21/2023 LARGE (A)  NEGATIVE Final   RBC / HPF 06/21/2023 6-10  0 - 5 RBC/hpf Final   WBC, UA 06/21/2023 >50  0 - 5 WBC/hpf Final   Bacteria, UA 06/21/2023 MANY (A)  NONE SEEN Final   Squamous Epithelial / HPF 06/21/2023 0-5  0 - 5 /HPF Final   Hyaline Casts, UA 06/21/2023 PRESENT   Final   Performed at [facility], [address] [place], [place] [address]   Preg, Serum 06/20/2023 NEGATIVE  NEGATIVE Final   Comment:        THE SENSITIVITY OF THIS METHODOLOGY IS >10 mIU/mL. Performed at [facility], [address], [place], [place] [address]    Alcohol, Ethyl (B) 06/20/2023 <10  <10 mg/dL Final   Comment: (NOTE) Lowest detectable limit for serum alcohol is 10 mg/dL.  For medical purposes only. Performed at [facility], [address] [place], [place] [address]    Opiates 06/21/2023 NONE DETECTED  NONE DETECTED Final   Cocaine 06/21/2023 POSITIVE (A)  NONE DETECTED Final   Benzodiazepines 06/21/2023 NONE DETECTED  NONE DETECTED Final   Amphetamines 06/21/2023 NONE DETECTED  NONE DETECTED Final   Tetrahydrocannabinol 06/21/2023 NONE DETECTED  NONE DETECTED Final   Barbiturates 06/21/2023 NONE DETECTED  NONE DETECTED Final   Comment: (NOTE) DRUG SCREEN FOR MEDICAL PURPOSES ONLY.  IF CONFIRMATION IS NEEDED FOR ANY PURPOSE, NOTIFY LAB WITHIN 5 DAYS.  LOWEST DETECTABLE LIMITS FOR URINE DRUG SCREEN Drug Class                     Cutoff (ng/mL) Amphetamine  and metabolites    1000 Barbiturate and metabolites    200 Benzodiazepine                 200 Opiates and metabolites        300 Cocaine and metabolites        300 THC                            50 Performed at [facility], [address], [place], [place] [address]     Blood Alcohol level:  Lab Results  Component Value Date   ETH <15 12/15/2023   ETH 144 (H) 11/20/2023    Metabolic Disorder Labs: Lab Results  Component Value Date   HGBA1C 4.9 12/15/2023   MPG 93.93 12/15/2023   MPG 105.41 06/06/2018   No results found for: "PROLACTIN" Lab Results  Component Value Date   CHOL 133 12/15/2023   TRIG 34 12/15/2023   HDL 57 12/15/2023   CHOLHDL 2.3 12/15/2023   VLDL 7 12/15/2023   LDLCALC 69 12/15/2023   LDLCALC 105 (H) 06/06/2018    Therapeutic Lab Levels: No results found for: "LITHIUM " No results found for: "VALPROATE" No results found for: "CBMZ"  Physical Findings   AIMS    Flowsheet Row [facility]) [facility] Ad[facility] (Discharged[facility] Ad[facility] (Discharged) [facility]Dis[facility]) [facility]  AIMS Total Score 0 0 0 0 0      AUDIT  Flowsheet Row ED from 12/14/2023 in [facility] ED from 06/21/2023 in [facility] Admission (Discharged) [facility] (Discharged[facility] Ad[facility] (Discharged) [facility]  Alcohol Use Disorder Identification Test Final Score (AUDIT) 24 19 0 0 1      PHQ2-9    Flowsheet Row ED from 12/14/2023 in [facility] ED from 06/21/2023 in [facility] Office Visit from 05/01/2014 in [facility] [place]  PHQ-2 Total Score 4 0 0  PHQ-9 Total Score 13 0 --      Flowsheet Row ED from 12/14/2023 in [facility] Most recent reading at 12/14/2023  4:45 PM ED from 12/14/2023 in [facility] Most recent reading at 12/14/2023 12:42 PM ED from 11/20/2023 in [facility] Emergency Department at [facility] Most recent reading at 11/20/2023 12:07 AM  C-SSRS RISK CATEGORY No Risk No Risk No Risk        Musculoskeletal  Strength & Muscle Tone: within normal limits Gait & Station: normal Patient leans: N/A  Psychiatric Specialty Exam  Presentation General Appearance:  Appropriate for Environment   Eye Contact: Fair   Speech: Clear and Coherent   Speech Volume: Normal    Handedness: Right     Mood and Affect  Mood: Depressed   Affect: Congruent     Thought Process  Thought Processes: Coherent; Goal Directed   Descriptions of Associations:Intact   Orientation:Full (Time, Place and Person)   Thought Content:Logical  Diagnosis of Schizophrenia or Schizoaffective disorder in past: No   Hallucinations:Denies Ideas of Reference:None   Suicidal Thoughts:Suicidal Thoughts: No   Homicidal Thoughts:Homicidal Thoughts: No     Sensorium  Memory: Immediate Fair; Recent Fair; Remote Fair   Judgment: Improving   Insight: Present     Executive Functions  Concentration: Fair   Attention Span: Fair   Recall: [employer] of Knowledge: Fair   Language: Fair     Psychomotor Activity  Psychomotor Activity: Psychomotor Activity: Normal     Assets  Assets: Communication Skills; Desire for Improvement     Sleep  Sleep: Sleep: Poor   Physical Exam  Physical Exam Constitutional:      Appearance: Normal appearance.  HENT:     Head: Normocephalic and atraumatic.     Nose: No congestion.     Mouth/Throat:     Mouth: Mucous membranes are moist.  Eyes:     Pupils: Pupils are equal, round, and reactive to light.  Cardiovascular:     Rate and Rhythm: Regular rhythm.  Pulmonary:     Effort: Pulmonary effort is normal.  Skin:    General: Skin is warm.  Neurological:     General: No focal deficit present.     Mental Status: She is alert and oriented to person, place, and time.      Review of Systems  Constitutional:  Negative for chills and fever.  HENT:  Negative for hearing loss and sore throat.   Eyes:  Negative for blurred vision.  Respiratory:  Negative for cough and shortness of breath.   Cardiovascular:  Negative for chest pain and palpitations.  Gastrointestinal:  Negative for nausea and vomiting.  Neurological:  Negative for dizziness and speech change.  Psychiatric/Behavioral:  Positive for depression and  substance abuse. Negative for hallucinations and suicidal ideas. The patient is nervous/anxious and has insomnia.      Blood pressure 126/82, pulse 95, temperature 97.9 F (36.6 C), temperature source Oral, resp. rate 18, SpO2 99%. There is no height or weight on file to calculate BMI.  Treatment Plan Summary: Daily contact with patient to assess and evaluate symptoms and progress in treatment and Medication management  Patient is admitted to locked unit at facility with crisis center Continue lithium  300 mg by mouth twice daily to help target manic and depressive symptoms Continue Trazodone  PRN at bed time for insomnia Will monitor for detox symptoms, will detox using CIWA and COWS protocol Patient was encouraged to keep herself hydrated Patient was encouraged to attend group and work on coping strategies and sobriety Social worker has been consulted to help with a safe discharge and discuss rehab options with the patient  Silas Drivers, MD

## 2023-12-16 NOTE — Group Note (Signed)
 Group Topic: Relapse and Recovery  Group Date: 12/16/2023 Start Time: 1210 End Time: 1245 Facilitators: Arlan Belling, RN  Department: [facility]  Number of Participants: 9  Group Focus: chemical dependency education, chemical dependency issues, clarity of thought, co-dependency, and coping skills Treatment Modality:  Behavior Modification Therapy Interventions utilized were clarification, exploration, patient education, and problem solving Purpose: enhance coping skills, explore maladaptive thinking, express feelings, express irrational fears, increase insight, regain self-worth, reinforce self-care, relapse prevention strategies, and trigger / craving management  Name: Judy Robles Date of Birth: 17-Aug-1976  MR: [id number]    Level of Participation: moderate Quality of Participation: attentive Interactions with others: gave feedback Mood/Affect: appropriate Triggers (if applicable):   Cognition: coherent/clear Progress: Moderate Response:   Plan: follow-up needed  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   MDD (major depressive disorder), severe (HCC) 12/18/2018   HCAP (healthcare-associated pneumonia) 09/16/2018   LLL pneumonia 09/16/2018   Sepsis (HCC) 09/16/2018   Hyponatremia 09/16/2018   Hypokalemia 09/16/2018   Severe benzodiazepine use disorder (HCC) 07/10/2018   Bipolar II disorder (HCC)    Severe recurrent major depression without psychotic features (HCC) 06/05/2018   PTSD (post-traumatic stress disorder) 11/21/2017   Major depressive disorder, recurrent episode, moderate (HCC)  11/05/2017   Community acquired pneumonia of right lower lobe of lung 09/02/2017   Sepsis associated hypotension (HCC) 09/02/2017   AKI (acute kidney injury) (HCC) 09/02/2017   Nausea and vomiting 09/02/2017   Drug overdose 04/22/2017   Chest pain 10/09/2016   Pressure injury of skin 10/05/2016   Polysubstance dependence including opioid type drug with complication, episodic abuse (HCC) 07/21/2016   MDD (major depressive disorder), recurrent severe, without psychosis (HCC) 07/21/2016   Chronic pain 07/03/2015   Diarrhea 05/01/2014   Primary hypertension 04/17/2014

## 2023-12-16 NOTE — ED Notes (Signed)
 Pt sleeping at present, no distress noted.  Monitoring for safety.

## 2023-12-17 DIAGNOSIS — F3181 Bipolar II disorder: Secondary | ICD-10-CM | POA: Diagnosis not present

## 2023-12-17 DIAGNOSIS — F1910 Other psychoactive substance abuse, uncomplicated: Secondary | ICD-10-CM | POA: Diagnosis not present

## 2023-12-17 DIAGNOSIS — Z5900 Homelessness unspecified: Secondary | ICD-10-CM | POA: Diagnosis not present

## 2023-12-17 DIAGNOSIS — F4310 Post-traumatic stress disorder, unspecified: Secondary | ICD-10-CM | POA: Diagnosis not present

## 2023-12-17 NOTE — ED Notes (Signed)
 Patient A&O x 4, ambulatory. Patient discharged in no acute distress. Patient denied SI/HI, A/VH upon discharge. Patient verbalized understanding of all discharge instructions reviewed on AVS via staff, to include follow up appointments, RX's and safety. Suicide safety plan completed and reviewed with Clinical research associate. A copy given to pt. Patient reported mood 10/10.  Pt had no belongings to be returned. Patient escorted to lobby via staff for transport to [facility] in [place] via [employer] taxi. Safety maintained.

## 2023-12-17 NOTE — ED Notes (Signed)
 Patient A&Ox4. Denies intent to harm self/others when asked. Denies A/VH. Patient denies any physical complaints when asked. No acute distress noted. Support and encouragement provided. Routine safety checks conducted according to facility protocol. Encouraged patient to notify staff if thoughts of harm toward self or others arise. Patient verbalize understanding and agreement. Pt verbalized agreement to go to [facility] this morning and to be transported via [employer] taxi. Taxi service called for transport. Will continue to monitor for safety.

## 2023-12-17 NOTE — ED Provider Notes (Signed)
 FBC/OBS ASAP Discharge Summary  Date and Time: 12/17/2023 9:37 AM  Name: Judy Robles  MRN:  [id number]   Discharge Diagnoses:  Final diagnoses:  Polysubstance dependence including opioid type drug with complication, episodic abuse (HCC) [F19.20]  Bipolar II disorder (HCC) [F31.81]  PTSD (post-traumatic stress disorder) [F43.10]   Judy Robles is a 48 y.o. female patient with a past psychiatric history significant for polysubstance abuse (opiates, cocaine, cannabis), alcohol use disorder, bipolar 2 disorder,    Subjective: Chart reviewed, case discussed with staff, patient seen during rounds.  Patient continues to endorse depressed mood and anhedonia.  She was in need of support and reassurance.  Although patient feels hopeful going to a residential treatment program.  Patient denies any intention or plan to harm herself or others.  She denies auditory visual hallucination.  She denies manic symptoms.  Patient denies side effects from medicine.  Patient was encouraged to get lithium  level in 2 to 3 days.  She was educated on the side effects of lithium  including dizziness, tremors, and blurry vision.  Patient was encouraged to let the provider at facility know if she experiences any side effects from lithium .  She was encouraged to keep herself hydrated.  Stay Summary: [facility]  Patient was detoxed using CIWA and COWS protocol.  Patient had attended groups while on the unit.  She was encouraged to work on her sobriety and coping strategies.  Patient was encouraged to abstain from illicit drug or alcohol.  Social worker was consulted to help with rehab options.  Patient was accepted at residential treatment program in [place].  She was discharged on 12/17/2023 to [facility] in [place]  Total Time spent with patient: 30 minutes  Past Psychiatric History: She denies outpatient psychiatry or counseling. She reports that in the past she was  prescribed gabapentin , Seroquel, and Zoloft . She reports that she has not taken her medications in the past 6 months. She reports a psychiatric history of manic depression, anxiety and ADHD.  Patient reports to drinking alcohol since age 58 years old. hs. She reports drinking alcohol a lot, she reports drinking a 5th of liquor along with beer. She estimates drinking (3) 4 locos per day on average. She denies a history of alcohol withdrawal seizures or delirium tremens. She states that she last received substance abuse treatment a couple months ago in [place] for 3 days. She reports using cocaine sporadically and states that she started using a few months ago. She reports last using cocaine 3-4 days ago.  Past Medical History: None reported by patient Family History: Patient reports her father has been diagnosed with bipolar disorder Social History: Patient reports she is homeless and has no source of income  Tobacco Cessation:  A prescription for an FDA-approved tobacco cessation medication provided at discharge  Current Medications:  Current Facility-Administered Medications  Medication Dose Route Frequency Provider Last Rate Last Admin   alum & mag hydroxide-simeth (MAALOX/MYLANTA) 200-200-20 MG/5ML suspension 30 mL  30 mL Oral Q4H PRN White, Patrice L, NP       dicyclomine  (BENTYL ) tablet 20 mg  20 mg Oral Q6H PRN White, Patrice L, NP       haloperidol  (HALDOL ) tablet 5 mg  5 mg Oral TID PRN White, Patrice L, NP       And   diphenhydrAMINE  (BENADRYL ) capsule 50 mg  50 mg Oral TID PRN Gwenlyn Lento, NP  haloperidol  lactate (HALDOL ) injection 5 mg  5 mg Intramuscular TID PRN White, Patrice L, NP       And   diphenhydrAMINE  (BENADRYL ) injection 50 mg  50 mg Intramuscular TID PRN White, Patrice L, NP       And   LORazepam  (ATIVAN ) injection 2 mg  2 mg Intramuscular TID PRN White, Patrice L, NP       haloperidol  lactate (HALDOL ) injection 10 mg  10 mg Intramuscular TID PRN White, Patrice  L, NP       And   diphenhydrAMINE  (BENADRYL ) injection 50 mg  50 mg Intramuscular TID PRN White, Patrice L, NP       And   LORazepam  (ATIVAN ) injection 2 mg  2 mg Intramuscular TID PRN White, Patrice L, NP       hydrOXYzine  (ATARAX ) tablet 25 mg  25 mg Oral TID PRN White, Patrice L, NP   25 mg at 12/16/23 2130   lithium  carbonate capsule 300 mg  300 mg Oral BID WC Aiysha Jillson, MD   300 mg at 12/17/23 0855   loperamide  (IMODIUM ) capsule 2-4 mg  2-4 mg Oral PRN White, Patrice L, NP       loperamide  (IMODIUM ) capsule 2-4 mg  2-4 mg Oral PRN White, Patrice L, NP       LORazepam  (ATIVAN ) tablet 1 mg  1 mg Oral Q6H PRN White, Patrice L, NP       magnesium  hydroxide (MILK OF MAGNESIA) suspension 30 mL  30 mL Oral Daily PRN White, Patrice L, NP       methocarbamol  (ROBAXIN ) tablet 500 mg  500 mg Oral Q8H PRN White, Patrice L, NP   500 mg at 12/17/23 0856   multivitamin with minerals tablet 1 tablet  1 tablet Oral Daily White, Patrice L, NP   1 tablet at 12/17/23 0855   ondansetron  (ZOFRAN -ODT) disintegrating tablet 4 mg  4 mg Oral Q6H PRN White, Patrice L, NP       ondansetron  (ZOFRAN -ODT) disintegrating tablet 4 mg  4 mg Oral Q6H PRN White, Patrice L, NP       thiamine  (VITAMIN B1) tablet 100 mg  100 mg Oral Daily White, Patrice L, NP   100 mg at 12/17/23 0855   traZODone  (DESYREL ) tablet 50 mg  50 mg Oral QHS PRN White, Patrice L, NP   50 mg at 12/16/23 2130   Current Outpatient Medications  Medication Sig Dispense Refill   hydrOXYzine  (ATARAX ) 25 MG tablet Take 1 tablet (25 mg total) by mouth 3 (three) times daily as needed for anxiety. 45 tablet 1   lithium  carbonate 300 MG capsule Take 1 capsule (300 mg total) by mouth 2 (two) times daily with a meal. 60 capsule 1   Multiple Vitamin (MULTIVITAMIN WITH MINERALS) TABS tablet Take 1 tablet by mouth daily. 30 tablet 1   traZODone  (DESYREL ) 50 MG tablet Take 1 tablet (50 mg total) by mouth at bedtime as needed for sleep. 30 tablet 0    PTA  Medications:  Facility Ordered Medications  Medication   alum & mag hydroxide-simeth (MAALOX/MYLANTA) 200-200-20 MG/5ML suspension 30 mL   magnesium  hydroxide (MILK OF MAGNESIA) suspension 30 mL   haloperidol  (HALDOL ) tablet 5 mg   And   diphenhydrAMINE  (BENADRYL ) capsule 50 mg   haloperidol  lactate (HALDOL ) injection 5 mg   And   diphenhydrAMINE  (BENADRYL ) injection 50 mg   And   LORazepam  (ATIVAN ) injection 2 mg   haloperidol  lactate (HALDOL ) injection 10  mg   And   diphenhydrAMINE  (BENADRYL ) injection 50 mg   And   LORazepam  (ATIVAN ) injection 2 mg   hydrOXYzine  (ATARAX ) tablet 25 mg   traZODone  (DESYREL ) tablet 50 mg   [COMPLETED] thiamine  (VITAMIN B1) injection 100 mg   thiamine  (VITAMIN B1) tablet 100 mg   multivitamin with minerals tablet 1 tablet   LORazepam  (ATIVAN ) tablet 1 mg   loperamide  (IMODIUM ) capsule 2-4 mg   ondansetron  (ZOFRAN -ODT) disintegrating tablet 4 mg   dicyclomine  (BENTYL ) tablet 20 mg   loperamide  (IMODIUM ) capsule 2-4 mg   methocarbamol  (ROBAXIN ) tablet 500 mg   ondansetron  (ZOFRAN -ODT) disintegrating tablet 4 mg   lithium  carbonate capsule 300 mg   PTA Medications  Medication Sig   hydrOXYzine  (ATARAX ) 25 MG tablet Take 1 tablet (25 mg total) by mouth 3 (three) times daily as needed for anxiety.   lithium  carbonate 300 MG capsule Take 1 capsule (300 mg total) by mouth 2 (two) times daily with a meal.   traZODone  (DESYREL ) 50 MG tablet Take 1 tablet (50 mg total) by mouth at bedtime as needed for sleep.   Multiple Vitamin (MULTIVITAMIN WITH MINERALS) TABS tablet Take 1 tablet by mouth daily.       12/17/2023    9:36 AM 12/15/2023   11:07 AM 06/26/2023    2:53 PM  Depression screen PHQ 2/9  Decreased Interest 2 2 0  Down, Depressed, Hopeless 2 2 0  PHQ - 2 Score 4 4 0  Altered sleeping 2 1 0  Tired, decreased energy 1 1 0  Change in appetite 2 2 0  Feeling bad or failure about yourself  2 2 0  Trouble concentrating 1 2 0  Moving slowly or  fidgety/restless 1 1 0  Suicidal thoughts 1 0 0  PHQ-9 Score 14 13 0  Difficult doing work/chores Somewhat difficult Very difficult     Flowsheet Row ED from 12/14/2023 in [facility] Most recent reading at 12/14/2023  4:45 PM ED from 12/14/2023 in [facility] Most recent reading at 12/14/2023 12:42 PM ED from 11/20/2023 in [facility] Emergency Department at [facility] Most recent reading at 11/20/2023 12:07 AM  C-SSRS RISK CATEGORY No Risk No Risk No Risk       Musculoskeletal  Strength & Muscle Tone: within normal limits Gait & Station: normal Patient leans: N/A  Psychiatric Specialty Exam  Presentation General Appearance:  Appropriate for Environment   Eye Contact: Fair   Speech: Clear and Coherent   Speech Volume: Normal   Handedness: Right     Mood and Affect  Mood: Depressed   Affect: Congruent     Thought Process  Thought Processes: Coherent; Goal Directed   Descriptions of Associations:Intact   Orientation:Full (Time, Place and Person)   Thought Content:Logical  Diagnosis of Schizophrenia or Schizoaffective disorder in past: No   Hallucinations:Denies Ideas of Reference:None   Suicidal Thoughts:Suicidal Thoughts: No   Homicidal Thoughts:Homicidal Thoughts: No     Sensorium  Memory: Immediate Fair; Recent Fair; Remote Fair   Judgment: Improving   Insight: Present     Executive Functions  Concentration: Fair   Attention Span: Fair   Recall: [employer] of Knowledge: Fair   Language: Fair     Psychomotor Activity  Psychomotor Activity: Psychomotor Activity: Normal     Assets  Assets: Communication Skills; Desire for Improvement     Sleep  Sleep: Fair    Physical Exam  Physical Exam Constitutional:      Appearance: Normal appearance.  HENT:     Head: Normocephalic and atraumatic.     Nose: No congestion.     Mouth/Throat:     Mouth:  Mucous membranes are moist.  Eyes:     Pupils: Pupils are equal, round, and reactive to light.  Cardiovascular:     Rate and Rhythm: Regular rhythm.  Pulmonary:     Effort: Pulmonary effort is normal.  Skin:    General: Skin is warm.  Neurological:     General: No focal deficit present.     Mental Status: She is alert and oriented to person, place, and time.      Review of Systems  Constitutional:  Negative for chills and fever.  HENT:  Negative for hearing loss and sore throat.   Eyes:  Negative for blurred vision.  Respiratory:  Negative for cough and shortness of breath.   Cardiovascular:  Negative for chest pain and palpitations.  Gastrointestinal:  Negative for nausea and vomiting.  Neurological:  Negative for dizziness and speech change.  Blood pressure 120/61, pulse 88, temperature 98 F (36.7 C), temperature source Oral, resp. rate 16, SpO2 99%. There is no height or weight on file to calculate BMI.  Demographic Factors:  Low socioeconomic status and Unemployed  Loss Factors: Decrease in vocational status, Decline in physical health, and Financial problems/change in socioeconomic status  Historical Factors: Impulsivity  Risk Reduction Factors:   Religious beliefs about death and Positive therapeutic relationship  Continued Clinical Symptoms:  Alcohol/Substance Abuse/Dependencies Previous Psychiatric Diagnoses and Treatments  Cognitive Features That Contribute To Risk:  Thought constriction (tunnel vision)    Suicide Risk:  Mild: No identifiable suicidal ideation today.    Plan Of Care/Follow-up recommendations:  Activity:  As tolerated Diet:  Cardiac healthy Other:  Patient was encouraged to abstain from alcohol and illicit drug use.  Patient was encouraged to work on sobriety  Disposition: [facility] in [facility], MD 12/17/2023, 9:37 AM

## 2023-12-17 NOTE — ED Notes (Signed)
 Pt is in the bedroom sleeping comfortably on the bed. NAD. environment secured.  Will keep monitoring for safety

## 2024-01-02 ENCOUNTER — Other Ambulatory Visit (HOSPITAL_COMMUNITY): Payer: Self-pay

## 2024-01-02 ENCOUNTER — Encounter (HOSPITAL_COMMUNITY): Payer: Self-pay

## 2024-01-02 ENCOUNTER — Emergency Department (HOSPITAL_COMMUNITY)
Admission: EM | Admit: 2024-01-02 | Discharge: 2024-01-02 | Disposition: A | Discharge status: Home / Self Care | Payer: MEDICAID | Attending: Student | Admitting: Student

## 2024-01-02 ENCOUNTER — Other Ambulatory Visit: Payer: Self-pay

## 2024-01-02 DIAGNOSIS — F17210 Nicotine dependence, cigarettes, uncomplicated: Secondary | ICD-10-CM | POA: Insufficient documentation

## 2024-01-02 DIAGNOSIS — Z22322 Carrier or suspected carrier of Methicillin resistant Staphylococcus aureus: Secondary | ICD-10-CM

## 2024-01-02 DIAGNOSIS — I10 Essential (primary) hypertension: Secondary | ICD-10-CM | POA: Diagnosis not present

## 2024-01-02 DIAGNOSIS — L989 Disorder of the skin and subcutaneous tissue, unspecified: Secondary | ICD-10-CM | POA: Diagnosis not present

## 2024-01-02 DIAGNOSIS — Z4800 Encounter for change or removal of nonsurgical wound dressing: Secondary | ICD-10-CM | POA: Insufficient documentation

## 2024-01-02 DIAGNOSIS — L0291 Cutaneous abscess, unspecified: Secondary | ICD-10-CM

## 2024-01-02 LAB — COMPREHENSIVE METABOLIC PANEL WITH GFR
ALT: 22 U/L (ref 0–44)
AST: 26 U/L (ref 15–41)
Albumin: 3.1 g/dL — ABNORMAL LOW (ref 3.5–5.0)
Alkaline Phosphatase: 66 U/L (ref 38–126)
Anion gap: 11 (ref 5–15)
BUN: 10 mg/dL (ref 6–20)
CO2: 22 mmol/L (ref 22–32)
Calcium: 9.3 mg/dL (ref 8.9–10.3)
Chloride: 105 mmol/L (ref 98–111)
Creatinine, Ser: 0.74 mg/dL (ref 0.44–1.00)
GFR, Estimated: >60 mL/min (ref >60)
Glucose, Bld: 96 mg/dL (ref 70–99)
Potassium: 3.7 mmol/L (ref 3.5–5.1)
Sodium: 138 mmol/L (ref 135–145)
Total Bilirubin: 0.5 mg/dL (ref 0.0–1.2)
Total Protein: 7.7 g/dL (ref 6.5–8.1)

## 2024-01-02 LAB — RESP PANEL BY RT-PCR (RSV, FLU A&B, COVID)  RVPGX2
Influenza A by PCR: NEGATIVE
Influenza B by PCR: NEGATIVE
Resp Syncytial Virus by PCR: NEGATIVE
SARS Coronavirus 2 by RT PCR: NEGATIVE

## 2024-01-02 LAB — CBC WITH DIFFERENTIAL/PLATELET
Abs Immature Granulocytes: 0.02 10*3/uL (ref 0.00–0.07)
Basophils Absolute: 0.1 10*3/uL (ref 0.0–0.1)
Basophils Relative: 1 %
Eosinophils Absolute: 0.1 10*3/uL (ref 0.0–0.5)
Eosinophils Relative: 1 %
HCT: 40.9 % (ref 36.0–46.0)
Hemoglobin: 13.2 g/dL (ref 12.0–15.0)
Immature Granulocytes: 0 %
Lymphocytes Relative: 26 %
Lymphs Abs: 2.1 10*3/uL (ref 0.7–4.0)
MCH: 31.6 pg (ref 26.0–34.0)
MCHC: 32.3 g/dL (ref 30.0–36.0)
MCV: 97.8 fL (ref 80.0–100.0)
Monocytes Absolute: 0.7 10*3/uL (ref 0.1–1.0)
Monocytes Relative: 9 %
Neutro Abs: 5 10*3/uL (ref 1.7–7.7)
Neutrophils Relative %: 63 %
Platelets: 230 10*3/uL (ref 150–400)
RBC: 4.18 MIL/uL (ref 3.87–5.11)
RDW: 12.9 % (ref 11.5–15.5)
WBC: 7.9 10*3/uL (ref 4.0–10.5)
nRBC: 0 % (ref 0.0–0.2)

## 2024-01-02 MED ORDER — DOXYCYCLINE HYCLATE 100 MG PO TABS
100.0000 mg | ORAL_TABLET | Freq: Two times a day (BID) | ORAL | 0 refills | Status: DC
  Filled 2024-01-02: qty 14, 7d supply, fill #0

## 2024-01-02 MED ORDER — CEFADROXIL 500 MG PO CAPS
500.0000 mg | ORAL_CAPSULE | Freq: Two times a day (BID) | ORAL | Status: DC
  Administered 2024-01-02: 500 mg via ORAL
  Filled 2024-01-02 (×2): qty 1

## 2024-01-02 MED ORDER — CEFADROXIL 500 MG PO CAPS
500.0000 mg | ORAL_CAPSULE | Freq: Two times a day (BID) | ORAL | 0 refills | Status: AC
  Filled 2024-01-02: qty 14, 7d supply, fill #0

## 2024-01-02 MED ORDER — NAPROXEN 375 MG PO TABS
375.0000 mg | ORAL_TABLET | Freq: Two times a day (BID) | ORAL | 0 refills | Status: DC
  Filled 2024-01-02: qty 20, 10d supply, fill #0

## 2024-01-02 MED ORDER — DOXYCYCLINE HYCLATE 100 MG PO TABS
100.0000 mg | ORAL_TABLET | Freq: Once | ORAL | Status: AC
  Administered 2024-01-02: 100 mg via ORAL
  Filled 2024-01-02: qty 1

## 2024-01-02 MED ORDER — OXYCODONE HCL 5 MG PO TABS
5.0000 mg | ORAL_TABLET | Freq: Once | ORAL | Status: AC
  Administered 2024-01-02: 5 mg via ORAL
  Filled 2024-01-02: qty 1

## 2024-01-02 MED ORDER — LIDOCAINE-EPINEPHRINE (PF) 2 %-1:200000 IJ SOLN
10.0000 mL | Freq: Once | INTRAMUSCULAR | Status: AC
Start: 2024-01-02 — End: 2024-01-02
  Administered 2024-01-02: 10 mL via INTRADERMAL
  Filled 2024-01-02: qty 20

## 2024-01-02 NOTE — ED Provider Notes (Signed)
 [place] EMERGENCY DEPARTMENT AT [facility] Provider Note  CSN: [id number] Arrival date & time: 01/02/24 1239  Chief Complaint(s) Wound Check  HPI Judy Robles is a 48 y.o. female with PMH bipolar 1, alcohol use disorder, MRSA, HLD who presents emerged department for evaluation of a wound check.  Patient recently incarcerated and states that they did not allow her to take a shower while in jail.  After being released, she started to notice multiple painful lesions over the face, back and buttocks.  Patient arrives with multiple lesions as described but denies fever, chest pain, shortness of breath or other systemic symptoms.  Denies IV drug use.   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   MDD (major depressive disorder), severe (HCC) 12/18/2018   HCAP (healthcare-associated pneumonia) 09/16/2018   LLL pneumonia 09/16/2018   Sepsis (HCC) 09/16/2018   Hyponatremia 09/16/2018   Hypokalemia 09/16/2018   Severe benzodiazepine use disorder (HCC) 07/10/2018   Bipolar II disorder (HCC)    Severe recurrent major depression without psychotic features (HCC) 06/05/2018   PTSD (post-traumatic stress disorder) 11/21/2017   Major depressive disorder, recurrent episode, moderate (HCC) 11/05/2017   Community acquired pneumonia of right lower lobe of lung 09/02/2017   Sepsis associated hypotension  (HCC) 09/02/2017   AKI (acute kidney injury) (HCC) 09/02/2017   Nausea and vomiting 09/02/2017   Drug overdose 04/22/2017   Chest pain 10/09/2016   Pressure injury of skin 10/05/2016   Polysubstance dependence including opioid type drug with complication, episodic abuse (HCC) 07/21/2016   MDD (major depressive disorder), recurrent severe, without psychosis (HCC) 07/21/2016   Chronic pain 07/03/2015   Diarrhea 05/01/2014   Primary hypertension 04/17/2014   Home Medication(s) Prior to Admission medications   Medication Sig Start Date End Date Taking? Authorizing Provider  cefadroxil (DURICEF) 500 MG capsule Take 1 capsule (500 mg total) by mouth 2 (two) times daily for 7 days. 01/02/24 01/09/24 Yes Jizel Cheeks, MD  doxycycline (VIBRAMYCIN) 100 MG capsule Take 1 capsule (100 mg total) by mouth 2 (two) times daily. 01/02/24  Yes Lashon Hillier, MD  hydrOXYzine  (ATARAX ) 25 MG tablet Take 1 tablet (25 mg total) by mouth 3 (three) times daily as needed for anxiety. 12/16/23   Silas Drivers, MD  lithium  carbonate 300 MG capsule Take 1 capsule (300 mg total) by mouth 2 (two) times daily with a meal. 12/16/23   Silas Drivers, MD  Multiple Vitamin (MULTIVITAMIN WITH MINERALS) TABS tablet Take 1 tablet by mouth daily. 12/17/23   Silas Drivers, MD  traZODone  (DESYREL ) 50 MG tablet Take 1 tablet (50 mg total) by mouth at bedtime as needed for sleep. 12/16/23   Silas Drivers, MD  Past Surgical History Past Surgical History:  Procedure Laterality Date   CESAREAN SECTION     FRACTURE SURGERY Right    ankle   I & D EXTREMITY Right 04/16/2014   Procedure: IRRIGATION AND DEBRIDEMENT EXTREMITY;  Surgeon: Shellie Dials, MD;  Location: [facility] OR;  Service: Orthopedics;  Laterality: Right;   Family History Family History  Problem Relation Age of Onset   Throat cancer  Mother    Hypertension Father    Stroke Father     Social History Social History   Tobacco Use   Smoking status: Every Day    Current packs/day: 1.00    Types: Cigarettes   Smokeless tobacco: Never   Tobacco comments:    1/2PPD  Vaping Use   Vaping status: Never Used  Substance Use Topics   Alcohol use: Yes    Comment: rare, occasional   Drug use: Yes    Types: Cocaine    Comment: occasional cocaine use   Allergies Gramineae pollens, Tylenol  [acetaminophen ], Lisinopril , and Mirtazapine   Review of Systems Review of Systems  Skin:  Positive for rash and wound.    Physical Exam Vital Signs  I have reviewed the triage vital signs BP 128/68 (BP Location: Left Arm)   Pulse 63   Temp 98.8 F (37.1 C) (Oral)   Resp 14   Ht 5\' 1"  (1.549 m)   Wt 94.8 kg   SpO2 98%   BMI 39.49 kg/m   Physical Exam Vitals and nursing note reviewed.  Constitutional:      General: She is not in acute distress.    Appearance: She is well-developed.  HENT:     Head: Normocephalic and atraumatic.  Eyes:     Conjunctiva/sclera: Conjunctivae normal.  Cardiovascular:     Rate and Rhythm: Normal rate and regular rhythm.     Heart sounds: No murmur heard. Pulmonary:     Effort: Pulmonary effort is normal. No respiratory distress.     Breath sounds: Normal breath sounds.  Abdominal:     Palpations: Abdomen is soft.     Tenderness: There is no abdominal tenderness.  Musculoskeletal:        General: No swelling.     Cervical back: Neck supple.  Skin:    General: Skin is warm and dry.     Capillary Refill: Capillary refill takes less than 2 seconds.     Findings: Lesion present.  Neurological:     Mental Status: She is alert.  Psychiatric:        Mood and Affect: Mood normal.     ED Results and Treatments Labs (all labs ordered are listed, but only abnormal results are displayed) Labs Reviewed  COMPREHENSIVE METABOLIC PANEL WITH GFR - Abnormal; Notable for the following  components:      Result Value   Albumin 3.1 (*)    All other components within normal limits  RESP PANEL BY RT-PCR (RSV, FLU A&B, COVID)  RVPGX2  CBC WITH DIFFERENTIAL/PLATELET  Radiology No results found.  Pertinent labs & imaging results that were available during my care of the patient were reviewed by me and considered in my medical decision making (see MDM for details).  Medications Ordered in ED Medications  oxyCODONE  (Oxy IR/ROXICODONE ) immediate release tablet 5 mg (has no administration in time range)  lidocaine -EPINEPHrine (XYLOCAINE  W/EPI) 2 %-1:200000 (PF) injection 10 mL (has no administration in time range)  doxycycline (VIBRA-TABS) tablet 100 mg (has no administration in time range)  cefadroxil (DURICEF) capsule 500 mg (has no administration in time range)                                                                                                                                     Procedures .Incision and Drainage  Date/Time: 01/02/2024 8:54 PM  Performed by: Karlyn Overman, MD Authorized by: Karlyn Overman, MD   Location:    Type:  Abscess Pre-procedure details:    Skin preparation:  Chlorhexidine  with alcohol Sedation:    Sedation type:  None Anesthesia:    Anesthesia method:  Local infiltration   Local anesthetic:  Lidocaine  2% WITH epi Procedure type:    Complexity:  Simple Procedure details:    Incision types:  Single straight   Incision depth:  Dermal   Drainage:  Purulent   Drainage amount:  Scant   Packing materials:  1/4 in iodoform gauze Post-procedure details:    Procedure completion:  Tolerated well, no immediate complications   (including critical care time)  Medical Decision Making / ED Course   This patient presents to the ED for concern of wound check, this involves an extensive number of treatment options,  and is a complaint that carries with it a high risk of complications and morbidity.  The differential diagnosis includes abscesses, MRSA, cellulitis, laceration, abrasion  MDM: Patient seen emergency room for evaluation of wound check.  Physical exam with multiple erythematous tender lesions with 1 over the eyebrow on the right, a larger more indurated 1 between the shoulder blades, small areas over the buttocks.  Laboratory valuation unremarkable.  Patient presentation consistent with likely MRSA infection with the larger lesion being an abscess.  Incision and drainage performed on this lesion leading to expression of a moderate amount of pus.  Patient started on Doxy and Duricef and these home prescriptions were brought to patient's bedside.  At this time she does not meet inpatient criteria for admission will be discharged with outpatient follow-up.   Additional history obtained:  -External records from outside source obtained and reviewed including: Chart review including previous notes, labs, imaging, consultation notes   Lab Tests: -I ordered, reviewed, and interpreted labs.   The pertinent results include:   Labs Reviewed  COMPREHENSIVE METABOLIC PANEL WITH GFR - Abnormal; Notable for the following components:      Result Value   Albumin 3.1 (*)    All other components within normal limits  RESP PANEL BY RT-PCR (RSV, FLU A&B, COVID)  RVPGX2  CBC WITH DIFFERENTIAL/PLATELET       Medicines ordered and prescription drug management: Meds ordered this encounter  Medications   oxyCODONE  (Oxy IR/ROXICODONE ) immediate release tablet 5 mg    Refill:  0   lidocaine -EPINEPHrine (XYLOCAINE  W/EPI) 2 %-1:200000 (PF) injection 10 mL   doxycycline (VIBRA-TABS) tablet 100 mg   cefadroxil (DURICEF) capsule 500 mg   doxycycline (VIBRAMYCIN) 100 MG capsule    Sig: Take 1 capsule (100 mg total) by mouth 2 (two) times daily.    Dispense:  14 capsule    Refill:  0   cefadroxil (DURICEF) 500 MG  capsule    Sig: Take 1 capsule (500 mg total) by mouth 2 (two) times daily for 7 days.    Dispense:  14 capsule    Refill:  0    -I have reviewed the patients home medicines and have made adjustments as needed  Critical interventions none   Cardiac Monitoring: The patient was maintained on a cardiac monitor.  I personally viewed and interpreted the cardiac monitored which showed an underlying rhythm of: NSR  Social Determinants of Health:  Factors impacting patients care include: Homeless, recently incarcerated   Reevaluation: After the interventions noted above, I reevaluated the patient and found that they have :improved  Co morbidities that complicate the patient evaluation  Past Medical History:  Diagnosis Date   Arthritis    Bipolar 1 disorder (HCC)    Depression    Hypercholesterolemia    Hypertension    MRSA (methicillin resistant Staphylococcus aureus)    Obesity    Renal disease    Sleep apnea       Dispostion: I considered admission for this patient, but at this time she does not meet inpatient criteria for admission will be discharged with outpatient follow-up.     Final Clinical Impression(s) / ED Diagnoses Final diagnoses:  None     @PCDICTATION @    Karlyn Overman, MD 01/02/24 2056

## 2024-01-02 NOTE — ED Provider Triage Note (Signed)
 Emergency Medicine Provider Triage Evaluation Note  Judy Robles , a 48 y.o. female  was evaluated in triage.  Pt complains of itchy and painful rash involving her face and back.  States she feels awful.  Review of Systems  Positive:  Negative:   Physical Exam  BP 128/68 (BP Location: Left Arm)   Pulse 63   Temp 98.8 F (37.1 C) (Oral)   Resp 14   Ht 5\' 1"  (1.549 m)   Wt 94.8 kg   SpO2 98%   BMI 39.49 kg/m  Gen:   Awake, no distress   Resp:  Normal effort  MSK:   Moves extremities without difficulty  Other:  Numerous lesions on face and back, largest one on thoracic region mildly erythematous and warm to touch  Medical Decision Making  Medically screening exam initiated at 1:53 PM.  Appropriate orders placed.  Judy Robles was informed that the remainder of the evaluation will be completed by another provider, this initial triage assessment does not replace that evaluation, and the importance of remaining in the ED until their evaluation is complete.  Basic labs, respiratory panel, suitable for waiting room   Stanton Earthly 01/02/24 1354

## 2024-01-02 NOTE — ED Triage Notes (Addendum)
 Pt to ED via [facility] from [facility]. Pt has areas of itching and swelling all over face and neck. Pt d/c'd from jail last week. Pt has multiple red, raised areas w/scabbing on face, neck, back and buttocks x 1 week. Pt states areas itch and are very painful.   Cbg 108 99.5 154/76

## 2024-01-03 ENCOUNTER — Other Ambulatory Visit (HOSPITAL_COMMUNITY): Payer: Self-pay

## 2024-01-04 NOTE — Congregational Nurse Program (Signed)
 Client to RN office she is overwhelmed and recently went to hospital due to MRSA infection and had a wound lanced and needed it to be rechecked and redressed. Her wound on her back is scabbed over but red and has crusting on sides. RN placed an ABD pad and clothe tape on wound. She was also given supplies for her wound care after today. She states she has medication and taking it.   She is also requesting bus passes to [facility] on [address] for vision care services. RN gave two bus passes to client. No further needs at this time.

## 2024-01-06 ENCOUNTER — Emergency Department (HOSPITAL_COMMUNITY)
Admission: EM | Admit: 2024-01-06 | Discharge: 2024-01-08 | Disposition: A | Discharge status: Home / Self Care | Payer: MEDICAID | Attending: Emergency Medicine | Admitting: Emergency Medicine

## 2024-01-06 ENCOUNTER — Ambulatory Visit (HOSPITAL_COMMUNITY)
Admission: EM | Admit: 2024-01-06 | Discharge: 2024-01-06 | Disposition: A | Discharge status: Other Acute Inpatient Hospital | Payer: MEDICAID | Attending: Family Medicine | Admitting: Family Medicine

## 2024-01-06 ENCOUNTER — Other Ambulatory Visit: Payer: Self-pay

## 2024-01-06 ENCOUNTER — Encounter (HOSPITAL_COMMUNITY): Payer: Self-pay

## 2024-01-06 DIAGNOSIS — F1410 Cocaine abuse, uncomplicated: Secondary | ICD-10-CM

## 2024-01-06 DIAGNOSIS — F17210 Nicotine dependence, cigarettes, uncomplicated: Secondary | ICD-10-CM | POA: Diagnosis not present

## 2024-01-06 DIAGNOSIS — F1020 Alcohol dependence, uncomplicated: Secondary | ICD-10-CM | POA: Diagnosis not present

## 2024-01-06 DIAGNOSIS — F1910 Other psychoactive substance abuse, uncomplicated: Secondary | ICD-10-CM

## 2024-01-06 DIAGNOSIS — F1420 Cocaine dependence, uncomplicated: Secondary | ICD-10-CM | POA: Diagnosis not present

## 2024-01-06 DIAGNOSIS — F332 Major depressive disorder, recurrent severe without psychotic features: Secondary | ICD-10-CM | POA: Diagnosis present

## 2024-01-06 DIAGNOSIS — Z5900 Homelessness unspecified: Secondary | ICD-10-CM | POA: Insufficient documentation

## 2024-01-06 DIAGNOSIS — F1994 Other psychoactive substance use, unspecified with psychoactive substance-induced mood disorder: Secondary | ICD-10-CM

## 2024-01-06 DIAGNOSIS — F1010 Alcohol abuse, uncomplicated: Secondary | ICD-10-CM | POA: Insufficient documentation

## 2024-01-06 DIAGNOSIS — F430 Acute stress reaction: Secondary | ICD-10-CM

## 2024-01-06 DIAGNOSIS — F1090 Alcohol use, unspecified, uncomplicated: Secondary | ICD-10-CM | POA: Diagnosis present

## 2024-01-06 DIAGNOSIS — F1510 Other stimulant abuse, uncomplicated: Secondary | ICD-10-CM

## 2024-01-06 DIAGNOSIS — Z59819 Housing instability, housed unspecified: Secondary | ICD-10-CM

## 2024-01-06 LAB — COMPREHENSIVE METABOLIC PANEL WITH GFR
ALT: 19 U/L (ref 0–44)
AST: 24 U/L (ref 15–41)
Albumin: 3.3 g/dL — ABNORMAL LOW (ref 3.5–5.0)
Alkaline Phosphatase: 72 U/L (ref 38–126)
Anion gap: 8 (ref 5–15)
BUN: 26 mg/dL — ABNORMAL HIGH (ref 6–20)
CO2: 22 mmol/L (ref 22–32)
Calcium: 8.6 mg/dL — ABNORMAL LOW (ref 8.9–10.3)
Chloride: 103 mmol/L (ref 98–111)
Creatinine, Ser: 1.17 mg/dL — ABNORMAL HIGH (ref 0.44–1.00)
GFR, Estimated: 57 mL/min — ABNORMAL LOW (ref >60)
Glucose, Bld: 94 mg/dL (ref 70–99)
Potassium: 4.4 mmol/L (ref 3.5–5.1)
Sodium: 133 mmol/L — ABNORMAL LOW (ref 135–145)
Total Bilirubin: 0.6 mg/dL (ref 0.0–1.2)
Total Protein: 7.4 g/dL (ref 6.5–8.1)

## 2024-01-06 LAB — HCG, SERUM, QUALITATIVE: Preg, Serum: NEGATIVE

## 2024-01-06 LAB — CBC
HCT: 38.7 % (ref 36.0–46.0)
Hemoglobin: 12.4 g/dL (ref 12.0–15.0)
MCH: 31.8 pg (ref 26.0–34.0)
MCHC: 32 g/dL (ref 30.0–36.0)
MCV: 99.2 fL (ref 80.0–100.0)
Platelets: 249 10*3/uL (ref 150–400)
RBC: 3.9 MIL/uL (ref 3.87–5.11)
RDW: 12.9 % (ref 11.5–15.5)
WBC: 7.1 10*3/uL (ref 4.0–10.5)
nRBC: 0 % (ref 0.0–0.2)

## 2024-01-06 LAB — ETHANOL: Alcohol, Ethyl (B): <15 mg/dL (ref <15)

## 2024-01-06 LAB — RAPID URINE DRUG SCREEN, HOSP PERFORMED
Amphetamines: POSITIVE — AB
Barbiturates: NOT DETECTED
Benzodiazepines: POSITIVE — AB
Cocaine: POSITIVE — AB
Opiates: NOT DETECTED
Tetrahydrocannabinol: POSITIVE — AB

## 2024-01-06 MED ORDER — THIAMINE HCL 100 MG/ML IJ SOLN: 100.0000 mg | Freq: Every day | INTRAMUSCULAR | Status: DC

## 2024-01-06 MED ORDER — LORAZEPAM 2 MG/ML IJ SOLN: 2.0000 mg | Freq: Three times a day (TID) | INTRAMUSCULAR | Status: DC | PRN

## 2024-01-06 MED ORDER — DIPHENHYDRAMINE HCL 50 MG PO CAPS: 50.0000 mg | ORAL_CAPSULE | Freq: Three times a day (TID) | ORAL | Status: DC | PRN

## 2024-01-06 MED ORDER — LORAZEPAM 1 MG PO TABS: 0.0000 mg | ORAL_TABLET | Freq: Two times a day (BID) | ORAL | Status: DC

## 2024-01-06 MED ORDER — DIPHENHYDRAMINE HCL 50 MG/ML IJ SOLN: 50.0000 mg | Freq: Three times a day (TID) | INTRAMUSCULAR | Status: DC | PRN

## 2024-01-06 MED ORDER — HALOPERIDOL 5 MG PO TABS: 5.0000 mg | ORAL_TABLET | Freq: Three times a day (TID) | ORAL | Status: DC | PRN

## 2024-01-06 MED ORDER — ALUM & MAG HYDROXIDE-SIMETH 200-200-20 MG/5ML PO SUSP: 30.0000 mL | ORAL | Status: DC | PRN

## 2024-01-06 MED ORDER — MAGNESIUM HYDROXIDE 400 MG/5ML PO SUSP: 30.0000 mL | Freq: Every day | ORAL | Status: DC | PRN

## 2024-01-06 MED ORDER — THIAMINE MONONITRATE 100 MG PO TABS
100.0000 mg | ORAL_TABLET | Freq: Every day | ORAL | Status: DC
  Administered 2024-01-07 – 2024-01-08 (×2): 100 mg via ORAL
  Filled 2024-01-06 (×2): qty 1

## 2024-01-06 MED ORDER — HALOPERIDOL LACTATE 5 MG/ML IJ SOLN: 10.0000 mg | Freq: Three times a day (TID) | INTRAMUSCULAR | Status: DC | PRN

## 2024-01-06 MED ORDER — LORAZEPAM 1 MG PO TABS
1.0000 mg | ORAL_TABLET | Freq: Four times a day (QID) | ORAL | Status: DC
  Administered 2024-01-06 – 2024-01-07 (×3): 1 mg via ORAL
  Filled 2024-01-06 (×3): qty 1

## 2024-01-06 MED ORDER — HALOPERIDOL LACTATE 5 MG/ML IJ SOLN: 5.0000 mg | Freq: Three times a day (TID) | INTRAMUSCULAR | Status: DC | PRN

## 2024-01-06 MED ORDER — HYDROXYZINE HCL 25 MG PO TABS: 25.0000 mg | ORAL_TABLET | Freq: Three times a day (TID) | ORAL | Status: DC | PRN

## 2024-01-06 MED ORDER — LORAZEPAM 1 MG PO TABS
0.0000 mg | ORAL_TABLET | Freq: Four times a day (QID) | ORAL | Status: DC
  Administered 2024-01-08: 1 mg via ORAL
  Filled 2024-01-06 (×2): qty 1

## 2024-01-06 MED ORDER — LORAZEPAM 1 MG PO TABS: 1.0000 mg | ORAL_TABLET | Freq: Two times a day (BID) | ORAL | Status: DC

## 2024-01-06 NOTE — ED Notes (Signed)
 Patient discharge via ambulatory with a steady gait with [facility] staff member Maryan Smalling ED. Respirations equal and unlabored, skin warm and dry. No acute distress noted.

## 2024-01-06 NOTE — Progress Notes (Signed)
   01/06/24 1835  [facility] Triage Screening (Walk-ins at [facility] only)  How Did You Hear About Us ? Self  What Is the Reason for Your Visit/Call Today? Judy Robles presents to [facility] voluntarily unaccompanied. Pt states that she needs detox from alcohol and to get started back on her depression medication. Pt states that she used cocaine on yesterday and drank 1 Four Loco this morning. Pt currently denies SI, HI and AVH.  How Long Has This Been Causing You Problems? > than 6 months  Have You Recently Had Any Thoughts About Hurting Yourself? No  Are You Planning to Commit Suicide/Harm Yourself At This time? No  Have you Recently Had Thoughts About Hurting Someone [name]? No  Are You Planning To Harm Someone At This Time? No  Physical Abuse Denies  Verbal Abuse Denies  Sexual Abuse Denies  Exploitation of patient/patient's resources Denies  Self-Neglect Denies  Are you currently experiencing any auditory, visual or other hallucinations? No  Have You Used Any Alcohol or Drugs in the Past 24 Hours? Yes  What Did You Use and How Much? this morning - alcohol (1 Four Loco) and cocaine on yesterday  Do you have any current medical co-morbidities that require immediate attention? No  Clinician description of patient physical appearance/behavior: calm, cooperative  What Do You Feel Would Help You the Most Today? Alcohol or Drug Use Treatment;Treatment for Depression or other mood problem  If access to [facility] was not available, would you have sought care in the Emergency Department? No  Determination of Need Routine (7 days)  Options For Referral Outpatient Therapy;Chemical Dependency Intensive Outpatient Therapy (CDIOP);Medication Management;Facility-Based Crisis

## 2024-01-06 NOTE — ED Triage Notes (Addendum)
 Pt requesting detox from ETOH and substance use. [facility] was unable to admit pt due to pt having a recent positive MRSA test and so they referred her to the ED. Pt's last ETOH use was 1300 today and last cocaine use was 2 days ago. Pt endorses being very anxious at the present time.

## 2024-01-06 NOTE — BH Assessment (Signed)
 Comprehensive Clinical Assessment (CCA) Note  01/06/2024 Latina Pol [id number]  Disposition: Judy Robles, PMHNP-BC recommends pt to be admitted to [facility] for Observation.   The patient demonstrates the following risk factors for suicide: Chronic risk factors for suicide include: substance use disorder. Acute risk factors for suicide include: N/A. Protective factors for this patient include: None. Considering these factors, the overall suicide risk at this point appears to be no risk. Patient is not appropriate for outpatient follow up.  Judy Robles is a 48 year old female who presents voluntary and unaccompanied to [facility]. Clinician asked the pt, "what  brought you to the hospital?" Pt reports, she wants detox. Pt denies, SI, HI, hallucinations, self-injurious behaviors and access to weapons.   Pt denies, being linked to OPT resources (medication management and/or counseling.) Pt reports, drinking a Four Loco Malt Beverage, today. Pt reports, she using "not much, $10," of Cocaine yesterday (01/05/2024.) Per chart, pt has previous admission to Facility Based Crisis on 12/14/2023-12/17/2023.   Pt presents quiet, awake disheveled with normal speech and fair eye contact. During the assessment, pt was a poor historian, she mostly looked down at the table. Pt's mood was sad. Pt's affect was congruent. Pt's insight was fair. Pt's judgement was poor.   Chief Complaint:  Chief Complaint  Patient presents with   Alcohol Problem   Visit Diagnosis: Alcohol use Disorder, severe.                              Cocaine use Disorder, severe.  CCA Screening, Triage and Referral (STR)  Patient Reported Information How did you hear about us ? Self  What Is the Reason for Your Visit/Call Today? Judy Robles presents to [facility] voluntarily unaccompanied. Pt states that she needs detox from alcohol and to get started back on her depression medication. Pt states that she used cocaine on yesterday and drank 1  Four Loco this morning. Pt currently denies SI, HI and AVH.  How Long Has This Been Causing You Problems? > than 6 months  What Do You Feel Would Help You the Most Today? Alcohol or Drug Use Treatment; Treatment for Depression or other mood problem   Have You Recently Had Any Thoughts About Hurting Yourself? No  Are You Planning to Commit Suicide/Harm Yourself At This time? No   Flowsheet Row ED from 01/02/2024 in [facility] Emergency Department at [facility] Most recent reading at 01/02/2024 12:57 PM [facility] Most recent reading at 12/14/2023  4:45 PM ED from 12/14/2023 in [facility] Most recent reading at 12/14/2023 12:42 PM  C-SSRS RISK CATEGORY No Risk No Risk No Risk       Have you Recently Had Thoughts About Hurting Someone Judy Robles? No  Are You Planning to Harm Someone at This Time? No  Explanation: NA   Have You Used Any Alcohol or Drugs in the Past 24 Hours? Yes  How Long Ago Did You Use Drugs or Alcohol? Today (01/06/2024.) What Did You Use and How Much? this morning - alcohol (1 Four Loco) and cocaine on yesterday   Do You Currently Have a Therapist/Psychiatrist? No  Name of Therapist/Psychiatrist:    Have You Been Recently Discharged From Any Office Practice or Programs? No  Explanation of Discharge From Practice/Program: NA    CCA Screening Triage Referral Assessment Type of Contact: Face-to-Face  Telemedicine Service Delivery:  Is this Initial or Reassessment?   Date Telepsych consult ordered in [facility]:    Time Telepsych consult ordered in CHL:    Location of Assessment: [facility] Assessment Services  Provider Location: [facility] Assessment Services   Collateral Involvement: None.   Does Patient Have a Automotive engineer Guardian? No  Legal Guardian Contact Information: Pt is her own guardian.  Copy of Legal Guardianship Form: -- (Pt is her own guardian.)  Legal  Guardian Notified of Arrival: -- (Pt is her own guardian.)  Legal Guardian Notified of Pending Discharge: -- (Pt is her own guardian.)  If Minor and Not Living with Parent(s), Who has Custody? Pt is an adult.  Is CPS involved or ever been involved? Never  Is APS involved or ever been involved? Never   Patient Determined To Be At Risk for Harm To Self or Others Based on Review of Patient Reported Information or Presenting Complaint? No  Method: No Plan  Availability of Means: No access or NA  Intent: Vague intent or NA  Notification Required: No need or identified person  Additional Information for Danger to Others Potential: -- (NA)  Additional Comments for Danger to Others Potential: NA  Are There Guns or Other Weapons in Your Home? No  Types of Guns/Weapons: Pt denies, access to weapons.  Are These Weapons Safely Secured?                            -- (NA)  Who Could Verify You Are Able To Have These Secured: NA  Do You Have any Outstanding Charges, Pending Court Dates, Parole/Probation? Pt reports, she has a court date on Jan 09, 2024 for Larceny but says the charge will be dismissed.  Contacted To Inform of Risk of Harm To Self or Others: Other: Comment (NA)    Does Patient Present under Involuntary Commitment? No    County of Residence: Other (Comment) (Pt reports, she's homeless, has no residence.)   Patient Currently Receiving the Following Services: Not Receiving Services   Determination of Need: Routine (7 days)   Options For Referral: Outpatient Therapy; Chemical Dependency Intensive Outpatient Therapy (CDIOP); Medication Management; Facility-Based Crisis     CCA Biopsychosocial Patient Reported Schizophrenia/Schizoaffective Diagnosis in Past: No   Strengths: NA   Mental Health Symptoms Depression:  Difficulty Concentrating; Fatigue; Hopelessness; Irritability; Worthlessness; Tearfulness; Sleep (too much or little) (Isolation.)   Duration  of Depressive symptoms:    Mania:  None   Anxiety:   Worrying; Difficulty concentrating; Fatigue; Irritability; Restlessness   Psychosis:  None   Duration of Psychotic symptoms:    Trauma:  None   Obsessions:  None   Compulsions:  None   Inattention:  None   Hyperactivity/Impulsivity:  Feeling of restlessness; Fidgets with hands/feet   Oppositional/Defiant Behaviors:  Angry   Emotional Irregularity:  None   Other Mood/Personality Symptoms:  NA    Mental Status Exam Appearance and self-care  Stature:  Average   Weight:  Overweight   Clothing:  Disheveled   Grooming:  Neglected   Cosmetic use:  None   Posture/gait:  Normal   Motor activity:  Not Remarkable   Sensorium  Attention:  Normal   Concentration:  Normal   Orientation:  X5   Recall/memory:  Normal   Affect and Mood  Affect:  Congruent   Mood:  Other (Comment) (Sad.)   Relating  Eye contact:  Normal   Facial expression:  Responsive  Attitude toward examiner:  Cooperative   Thought and Language  Speech flow: Normal   Thought content:  Appropriate to Mood and Circumstances   Preoccupation:  None   Hallucinations:  None   Organization:  Coherent   [employer] of Knowledge:  Fair   Intelligence:  Average   Abstraction:  Normal   Judgement:  Poor   Reality Testing:  Adequate   Insight:  Fair   Decision Making:  Impulsive   Social Functioning  Social Maturity:  Impulsive; Irresponsible   Social Judgement:  "Street Smart"   Stress  Stressors:  Grief/losses; Housing; Office manager Ability:  Exhausted   Skill Deficits:  None   Supports:  Friends/Service system     Religion: Religion/Spirituality Are You A Religious Person?: No How Might This Affect Treatment?: NA  Leisure/Recreation: Leisure / Recreation Do You Have Hobbies?: No  Exercise/Diet: Exercise/Diet Do You Exercise?: No Have You Gained or Lost A Significant Amount of Weight in  the Past Six Months?: No Do You Follow a Special Diet?: No Do You Have Any Trouble Sleeping?: No   CCA Employment/Education Employment/Work Situation: Employment / Work Situation Employment Situation: Unemployed Patient's Job has Been Impacted by Current Illness: No Has Patient ever Been in Equities trader?: No  Education: Education Is Patient Currently Attending School?: No Last Grade Completed: 12 Did You Product manager?: Yes What Type of College Degree Do you Have?: Pt reports, some college but no degree. Did You Have An Individualized Education Program (IIEP): No Did You Have Any Difficulty At School?: No Patient's Education Has Been Impacted by Current Illness: No   CCA Family/Childhood History Family and Relationship History: Family history Marital status: Single Does patient have children?: Yes How many children?: 2 How is patient's relationship with their children?: Pt reports, she has two children but one is deceased. Pt reports, he has a good relationship with her son.  Childhood History:  Childhood History By whom was/is the patient raised?: Mother Did patient suffer any verbal/emotional/physical/sexual abuse as a child?: No (Pt denies.) Did patient suffer from severe childhood neglect?: No (Pt denies.) Has patient ever been sexually abused/assaulted/raped as an adolescent or adult?: No (Pt denies.) Type of abuse, by whom, and at what age: Pt denies. Was the patient ever a victim of a crime or a disaster?: No How has this affected patient's relationships?: Pt denies. Spoken with a professional about abuse?: No (Pt denies.) Does patient feel these issues are resolved?: No (Pt denies.) Witnessed domestic violence?: No (Pt denies.) Has patient been affected by domestic violence as an adult?:  (NA)   CCA Substance Use Alcohol/Drug Use: Alcohol / Drug Use Pain Medications: See MAR Prescriptions: See MAR Over the Counter: See MAR History of alcohol / drug use?:  Yes Longest period of sobriety (when/how long): Pt reports, she doesn't know it's been so long ago she can't remember. Negative Consequences of Use: Financial, Personal relationships Withdrawal Symptoms: Sweats (Leg cramps.) Substance #1 Name of Substance 1: Alcohol. 1 - Age of First Use: 15. 1 - Amount (size/oz): Pt reports, drinking a Four Loco Malt Beverage, today. 1 - Frequency: Everyday. 1 - Duration: Ongoing. 1 - Last Use / Amount: Today (01/06/2024). 1 - Method of Aquiring: UTA 1- Route of Use: Oral. Substance #2 Name of Substance 2: Cocaine. 2 - Age of First Use: 15. 2 - Amount (size/oz): Pt reprots, she using "not much, $10." 2 - Frequency: Pt reports, "Every blue moon."  ASAM's:  Six Dimensions of Multidimensional Assessment  Dimension 1:  Acute Intoxication and/or Withdrawal Potential:      Dimension 2:  Biomedical Conditions and Complications:      Dimension 3:  Emotional, Behavioral, or Cognitive Conditions and Complications:     Dimension 4:  Readiness to Change:     Dimension 5:  Relapse, Continued use, or Continued Problem Potential:     Dimension 6:  Recovery/Living Environment:     ASAM Severity Score:    ASAM Recommended Level of Treatment:     Substance use Disorder (SUD)    Recommendations for Services/Supports/Treatments:    Disposition Recommendation per psychiatric provider: Pt to be admitted to [facility] for Observation.    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   MDD (major depressive disorder), severe (HCC) 12/18/2018   HCAP (healthcare-associated pneumonia) 09/16/2018   LLL pneumonia  09/16/2018   Sepsis (HCC) 09/16/2018   Hyponatremia 09/16/2018   Hypokalemia 09/16/2018   Severe benzodiazepine use disorder (HCC) 07/10/2018   Bipolar II disorder (HCC)    Severe recurrent major depression without psychotic features (HCC) 06/05/2018   PTSD (post-traumatic stress disorder) 11/21/2017   Major depressive disorder, recurrent episode, moderate (HCC) 11/05/2017   Community acquired pneumonia of right lower lobe of lung 09/02/2017   Sepsis associated hypotension (HCC) 09/02/2017   AKI (acute kidney injury) (HCC) 09/02/2017   Nausea and vomiting 09/02/2017   Drug overdose 04/22/2017   Chest pain 10/09/2016   Pressure injury of skin 10/05/2016   Polysubstance dependence including opioid type drug with complication, episodic abuse (HCC) 07/21/2016   MDD (major depressive disorder), recurrent severe, without psychosis (HCC) 07/21/2016   Chronic pain 07/03/2015   Diarrhea 05/01/2014   Primary hypertension 04/17/2014     Referrals to Alternative Service(s): Referred to Alternative Service(s):   Place:   Date:   Time:    Referred to Alternative Service(s):   Place:   Date:   Time:    Referred to Alternative Service(s):   Place:   Date:   Time:    Referred to Alternative Service(s):   Place:   Date:   Time:     Rosi Converse, [facility] Comprehensive Clinical Assessment (CCA) Screening, Triage and Referral Note  01/06/2024 Judy Robles [id number]  Chief Complaint:  Chief Complaint  Patient presents with   Alcohol Problem   Visit Diagnosis:   Patient Reported Information How did you hear about us ? Self  What Is the Reason for Your Visit/Call Today? Judy Robles presents to [facility] voluntarily unaccompanied. Pt states that she needs detox from alcohol and to get started back on her depression medication. Pt states that she used cocaine on yesterday and drank 1 Four Loco this morning. Pt currently denies SI, HI and AVH.  How Long Has This Been Causing You Problems? > than 6  months  What Do You Feel Would Help You the Most Today? Alcohol or Drug Use Treatment; Treatment for Depression or other mood problem   Have You Recently Had Any Thoughts About Hurting Yourself? No  Are You Planning to Commit Suicide/Harm Yourself At This time? No   Have you Recently Had Thoughts About Hurting Someone Judy Robles?  No  Are You Planning to Harm Someone at This Time? No  Explanation: NA   Have You Used Any Alcohol or Drugs in the Past 24 Hours? Yes  How Long Ago Did You Use Drugs or Alcohol? Today (01/06/2024) What Did You Use and How Much? this morning - alcohol (1 Four Loco) and cocaine on yesterday   Do You Currently Have a Therapist/Psychiatrist? No  Name of Therapist/Psychiatrist: NA  Have You Been Recently Discharged From Any Office Practice or Programs? No  Explanation of Discharge From Practice/Program: No.   CCA Screening Triage Referral Assessment Type of Contact: Face-to-Face  Telemedicine Service Delivery:   Is this Initial or Reassessment?   Date Telepsych consult ordered in CHL:    Time Telepsych consult ordered in CHL:    Location of Assessment: [facility] Assessment Services  Provider Location: [facility] Assessment Services    Collateral Involvement: None.   Does Patient Have a Automotive engineer Guardian? No. Name and Contact of Legal Guardian: Pt is her own guardian. If Minor and Not Living with Parent(s), Who has Custody? Pt is an adult.  Is CPS involved or ever been involved? Never  Is APS involved or ever been involved? Never   Patient Determined To Be At Risk for Harm To Self or Others Based on Review of Patient Reported Information or Presenting Complaint? No  Method: No Plan  Availability of Means: No access or NA  Intent: Vague intent or NA  Notification Required: No need or identified person  Additional Information for Danger to Others Potential: -- (NA)  Additional Comments for Danger to Others Potential: NA  Are There  Guns or Other Weapons in Your Home? No  Types of Guns/Weapons: Pt denies, access to weapons.  Are These Weapons Safely Secured?                            -- (NA)  Who Could Verify You Are Able To Have These Secured: NA  Do You Have any Outstanding Charges, Pending Court Dates, Parole/Probation? Pt reports, she has a court date on Jan 09, 2024 for Larceny but says the charge will be dismissed.  Contacted To Inform of Risk of Harm To Self or Others: Other: Comment (NA)   Does Patient Present under Involuntary Commitment? No    County of Residence: Other (Comment) (Pt reports, she's homeless, has no residence.)   Patient Currently Receiving the Following Services: Not Receiving Services   Determination of Need: Routine (7 days)   Options For Referral: Outpatient Therapy; Chemical Dependency Intensive Outpatient Therapy (CDIOP); Medication Management; Facility-Based Crisis   Disposition Recommendation per psychiatric provider: Pt to be admitted to [facility] for Observation.   Rosi Converse, LCMHC   Tawania Daponte D Min Collymore, MS, [facility] Triage Specialist [phone]

## 2024-01-06 NOTE — Discharge Instructions (Addendum)
 Transport to [facility] ED via [employer]

## 2024-01-06 NOTE — ED Notes (Signed)
 Safe transport contacted and ride arranged to take pt to [facility]

## 2024-01-06 NOTE — ED Provider Notes (Signed)
 [facility]-EMERGENCY DEPT [facility] Emergency Department Provider Note MRN:  [id number]  Arrival date & time: 01/06/24     Chief Complaint   Alcohol Problem   History of Present Illness   Judy Robles is a 48 y.o. year-old female presents to the ED with chief complaint of ETOH abuse requesting detox.  Seen earlier an [facility] and was recommended for observation, but they were unable to facilitate due to recent MRSA diagnosis.  Patient sent to [facility].  She states that her last drink was this morning.  She denies a/v hallucinations.  Denies n/v.  She states that she thinks she is having some slight withdrawals.  She denies any other associated symptoms.  History provided by patient.   Review of Systems  Pertinent positive and negative review of systems noted in HPI.    Physical Exam   Vitals:   01/06/24 2131  BP: (!) 164/78  Pulse: 60  Resp: 16  Temp: 97.7 F (36.5 C)  SpO2: 98%    CONSTITUTIONAL:  non toxic-appearing, NAD NEURO:  Alert and oriented x 3, CN 3-12 grossly intact EYES:  eyes equal and reactive ENT/NECK:  Supple, no stridor  CARDIO:  normal rate, regular rhythm, appears well-perfused  PULM:  No respiratory distress, CTAB GI/GU:  non-distended,  MSK/SPINE:  No gross deformities, no edema, moves all extremities  SKIN:  no rash, atraumatic   *Additional and/or pertinent findings included in MDM below  Diagnostic and Interventional Summary    EKG Interpretation Date/Time:    Ventricular Rate:    PR Interval:    QRS Duration:    QT Interval:    QTC Calculation:   R Axis:      Text Interpretation:         Labs Reviewed  COMPREHENSIVE METABOLIC PANEL WITH GFR - Abnormal; Notable for the following components:      Result Value   Sodium 133 (*)    BUN 26 (*)    Creatinine, Ser 1.17 (*)    Calcium  8.6 (*)    Albumin 3.3 (*)    GFR, Estimated 57 (*)    All other components within normal limits  ETHANOL  CBC  HCG, SERUM, QUALITATIVE  RAPID URINE DRUG  SCREEN, HOSP PERFORMED    No orders to display    Medications  LORazepam  (ATIVAN ) tablet 1 mg (1 mg Oral Given 01/06/24 2233)    Or  LORazepam  (ATIVAN ) tablet 0-4 mg ( Oral See Alternative 01/06/24 2233)  LORazepam  (ATIVAN ) tablet 1 mg (has no administration in time range)    Or  LORazepam  (ATIVAN ) tablet 0-4 mg (has no administration in time range)  thiamine  (VITAMIN B1) tablet 100 mg (has no administration in time range)    Or  thiamine  (VITAMIN B1) injection 100 mg (has no administration in time range)     Procedures  /  Critical Care Procedures  ED Course and Medical Decision Making  I have reviewed the triage vital signs, the nursing notes, and pertinent available records from the EMR.  Social Determinants Affecting Complexity of Care: Patient has no clinically significant social determinants affecting this chief complaint..   ED Course:    Medical Decision Making Amount and/or Complexity of Data Reviewed Labs: ordered.         Consultants: TTS consulted earlier.  Recommendation was for obs.    I messaged with [facility], [facility], who states that she will add the patient back onto the list to be seen in the morning.  Patient  placed on CIWA.   Treatment and Plan: Dispo pending AM reassessment with psych.    Final Clinical Impressions(s) / ED Diagnoses     ICD-10-CM   1. Alcohol abuse  F10.10       ED Discharge Orders     None         Discharge Instructions Discussed with and Provided to Patient:   Discharge Instructions   None      Sherel Dikes, PA-C 01/06/24 2248    Dorenda Gandy, MD 01/06/24 2257

## 2024-01-06 NOTE — ED Provider Notes (Cosign Needed Addendum)
 Behavioral Health Urgent Care Medical Screening Exam  Date: 01/06/24 Patient Name: Judy Robles MRN: [id number] Chief Complaint: "I want to detox from alcohol and get back on my medication and get into a program"   Diagnoses:  Final diagnoses:  Polysubstance abuse [facility])    HPI: Judy Robles presents to [facility] voluntarily unaccompanied. Pt was sent here from [facility] and is starting to feel withdraws from alcohol. Pt states that she is coming off of alcohol and opiates, and is seeking medication for depression. Pt states she is interested in taking suboxin to help with withdraws. Pt states that she is homeless now which makes it harder to stay sober. Pt states she has been having trouble sleeping. Pt states she is seeking inpatient for detox, knowing she will use when she leaves this facility. Pt denies SI, HI, AVH. Most recently used percocet 10 last night, yesterday about a 5th of vodka and some beers, and crack $5 worth two days ago.   Chart reviewed with attending psychiatrist, Dr Kathlen Para.   Judy Robles is seen in [facility] treatment area. She is alert & oriented x 4. She engages in today's evaluation and appears to be a reliable historian. She states "I want to detox from alcohol and get back on my medication and get into a program" States she was previously on Seroquel, Lithium  and Gabapentin . Has not had Seroquel and Lithium  in 3 weeks and no gabapentin  in 2 weeks. She was discharged from [facility] on 12/17/2023 and sent to [facility] in [place]. She states she stayed there "a little over a week" and was started on MOUD with buprenorphine  while there.States she was discharged 2 weeks ago and was not provided with RX for buprenorphine .  She endorses substance use to include tobacco 1 ppd; rare THC use with last use a week ago; ETOH is of 5-6 four locos a day with last use at 11am today; cocaine use of $10 three times a week with last use yesterday; occasional methamphetamine use with last use a week ago; denies opiate  use; denies heroin and fentanyl  use. Endorses ETOH are drug of choice and states longest period of sobriety is 6 months.   She states she does not have an outpatient psych provider. Reports 2 inpatient psych admissions at [facility] and [facility]. She denies previous suicide attempts and denies any history of self-injurious behavior. She denies suicidal or homicidal ideation, intent or plan. She denies AVH. She will be admitted to [facility] for monitoring of mental health symptoms.   Total Time spent with patient: 20 minutes  Musculoskeletal  Strength & Muscle Tone: not assessed - patient seated at table during this evaluation  Gait & Station: not assessed - patient seated at table during this evaluation  Patient leans: N/A  Psychiatric Specialty Exam  Presentation General Appearance:  Appropriate for Environment; Disheveled  Eye Contact: Good  Speech: Clear and Coherent; Normal Rate  Speech Volume: Normal  Handedness: Right   Mood and Affect  Mood: Anxious  Affect: Appropriate; Congruent   Thought Process  Thought Processes: Coherent  Descriptions of Associations:Intact  Orientation:Full (Time, Place and Person)  Thought Content:Logical  Diagnosis of Schizophrenia or Schizoaffective disorder in past: No   Hallucinations:Hallucinations: None  Ideas of Reference:None  Suicidal Thoughts:Suicidal Thoughts: No  Homicidal Thoughts:Homicidal Thoughts: No   Sensorium  Memory: Recent Good; Immediate Good  Judgment: Fair  Insight: Fair   Art therapist  Concentration: Fair  Attention Span: Fair  Recall: Good  Fund of Knowledge:  Good  Language: Good   Psychomotor Activity  Psychomotor Activity:Psychomotor Activity: Normal   Assets  Assets: Communication Skills; Desire for Improvement; Resilience   Sleep  Sleep:Sleep: Fair Number of Hours of Sleep: 5   No data recorded  Physical Exam Vitals and nursing note reviewed.  HENT:      Mouth/Throat:     Mouth: Mucous membranes are moist.  Cardiovascular:     Rate and Rhythm: Normal rate.  Skin:    General: Skin is warm and dry.     Comments: Has small excoriated areas to her face.   Neurological:     Mental Status: She is alert and oriented to person, place, and time.  Psychiatric:        Behavior: Behavior normal.        Thought Content: Thought content normal.    Review of Systems  Constitutional:  Negative for fever.  Respiratory:  Negative for cough and shortness of breath.   Cardiovascular:  Negative for chest pain and palpitations.  Gastrointestinal:  Positive for nausea. Negative for diarrhea and vomiting.  Psychiatric/Behavioral:  Positive for substance abuse. Negative for hallucinations and suicidal ideas. The patient is nervous/anxious.     Blood pressure (!) 152/64, pulse 69, temperature 98.2 F (36.8 C), temperature source Oral, resp. rate 16, SpO2 100%. There is no height or weight on file to calculate BMI.  Past Psychiatric History: Polysubstance Use, Manic depression, anxiety   Is the patient at risk to self? No  Has the patient been a risk to self in the past 6 months? No .    Has the patient been a risk to self within the distant past? No   Is the patient a risk to others? No   Has the patient been a risk to others in the past 6 months? No   Has the patient been a risk to others within the distant past? No   Past Medical History: ER visit on 01/02/24 for I & D of abscess Restless Legs, HTN  Family History:  Father: bipolar, schizophrenia Son: bipolar  Social History: Unemployed. Unhoused.   Last Labs:  Admission on 01/02/2024, Discharged on 01/02/2024  Component Date Value Ref Range Status   SARS Coronavirus 2 by RT PCR 01/02/2024 NEGATIVE  NEGATIVE Final   Influenza A by PCR 01/02/2024 NEGATIVE  NEGATIVE Final   Influenza B by PCR 01/02/2024 NEGATIVE  NEGATIVE Final   Comment: (NOTE) The Xpert Xpress SARS-CoV-2/FLU/RSV plus assay  is intended as an aid in the diagnosis of influenza from Nasopharyngeal swab specimens and should not be used as a sole basis for treatment. Nasal washings and aspirates are unacceptable for Xpert Xpress SARS-CoV-2/FLU/RSV testing.  Fact Sheet for Patients: BloggerCourse.com  Fact Sheet for Healthcare Providers: SeriousBroker.it  This test is not yet approved or cleared by the [place]  FDA and has been authorized for detection and/or diagnosis of SARS-CoV-2 by FDA under an Emergency Use Authorization (EUA). This EUA will remain in effect (meaning this test can be used) for the duration of the COVID-19 declaration under Section 564(b)(1) of the Act, 21 U.S.C. section 360bbb-3(b)(1), unless the authorization is terminated or revoked.     Resp Syncytial Virus by PCR 01/02/2024 NEGATIVE  NEGATIVE Final   Comment: (NOTE) Fact Sheet for Patients: BloggerCourse.com  Fact Sheet for Healthcare Providers: SeriousBroker.it  This test is not yet approved or cleared by the [place]  FDA and has been authorized for detection and/or diagnosis of SARS-CoV-2 by FDA under an  Emergency Use Authorization (EUA). This EUA will remain in effect (meaning this test can be used) for the duration of the COVID-19 declaration under Section 564(b)(1) of the Act, 21 U.S.C. section 360bbb-3(b)(1), unless the authorization is terminated or revoked.  Performed at [facility], [address] [place], [place] [address]    WBC 01/02/2024 7.9  4.0 - 10.5 K/uL Final   RBC 01/02/2024 4.18  3.87 - 5.11 MIL/uL Final   Hemoglobin 01/02/2024 13.2  12.0 - 15.0 g/dL Final   HCT 60/45/4098 40.9  36.0 - 46.0 % Final   MCV 01/02/2024 97.8  80.0 - 100.0 fL Final   MCH 01/02/2024 31.6  26.0 - 34.0 pg Final   MCHC 01/02/2024 32.3  30.0 - 36.0 g/dL Final   RDW 11/91/4782 12.9  11.5 - 15.5 % Final   Platelets  01/02/2024 230  150 - 400 K/uL Final   nRBC 01/02/2024 0.0  0.0 - 0.2 % Final   Neutrophils Relative % 01/02/2024 63  % Final   Neutro Abs 01/02/2024 5.0  1.7 - 7.7 K/uL Final   Lymphocytes Relative 01/02/2024 26  % Final   Lymphs Abs 01/02/2024 2.1  0.7 - 4.0 K/uL Final   Monocytes Relative 01/02/2024 9  % Final   Monocytes Absolute 01/02/2024 0.7  0.1 - 1.0 K/uL Final   Eosinophils Relative 01/02/2024 1  % Final   Eosinophils Absolute 01/02/2024 0.1  0.0 - 0.5 K/uL Final   Basophils Relative 01/02/2024 1  % Final   Basophils Absolute 01/02/2024 0.1  0.0 - 0.1 K/uL Final   Immature Granulocytes 01/02/2024 0  % Final   Abs Immature Granulocytes 01/02/2024 0.02  0.00 - 0.07 K/uL Final   Performed at [facility], [address] [place], [place] [address]   Sodium 01/02/2024 138  135 - 145 mmol/L Final   Potassium 01/02/2024 3.7  3.5 - 5.1 mmol/L Final   Chloride 01/02/2024 105  98 - 111 mmol/L Final   CO2 01/02/2024 22  22 - 32 mmol/L Final   Glucose, Bld 01/02/2024 96  70 - 99 mg/dL Final   Glucose reference range applies only to samples taken after fasting for at least 8 hours.   BUN 01/02/2024 10  6 - 20 mg/dL Final   Creatinine, Ser 01/02/2024 0.74  0.44 - 1.00 mg/dL Final   Calcium  01/02/2024 9.3  8.9 - 10.3 mg/dL Final   Total Protein 30/86/5784 7.7  6.5 - 8.1 g/dL Final   Albumin 69/62/9528 3.1 (L)  3.5 - 5.0 g/dL Final   AST 41/32/4401 26  15 - 41 U/L Final   ALT 01/02/2024 22  0 - 44 U/L Final   Alkaline Phosphatase 01/02/2024 66  38 - 126 U/L Final   Total Bilirubin 01/02/2024 0.5  0.0 - 1.2 mg/dL Final   GFR, Estimated 01/02/2024 >60  >60 mL/min Final   Comment: (NOTE) Calculated using the CKD-EPI Creatinine Equation (2021)    Anion gap 01/02/2024 11  5 - 15 Final   Performed at [facility], [address], [place], [place] [address]  Admission on 12/14/2023, Discharged on 12/17/2023  Component Date Value Ref Range Status   WBC 12/15/2023 4.4  4.0 - 10.5  K/uL Final   RBC 12/15/2023 3.54 (L)  3.87 - 5.11 MIL/uL Final   Hemoglobin 12/15/2023 11.3 (L)  12.0 - 15.0 g/dL Final   HCT 36/64/4034 34.7 (L)  36.0 - 46.0 % Final   MCV 12/15/2023  98.0  80.0 - 100.0 fL Final   MCH 12/15/2023 31.9  26.0 - 34.0 pg Final   MCHC 12/15/2023 32.6  30.0 - 36.0 g/dL Final   RDW 16/05/9603 14.2  11.5 - 15.5 % Final   Platelets 12/15/2023 150  150 - 400 K/uL Final   nRBC 12/15/2023 0.0  0.0 - 0.2 % Final   Neutrophils Relative % 12/15/2023 47  % Final   Neutro Abs 12/15/2023 2.1  1.7 - 7.7 K/uL Final   Lymphocytes Relative 12/15/2023 41  % Final   Lymphs Abs 12/15/2023 1.8  0.7 - 4.0 K/uL Final   Monocytes Relative 12/15/2023 8  % Final   Monocytes Absolute 12/15/2023 0.4  0.1 - 1.0 K/uL Final   Eosinophils Relative 12/15/2023 3  % Final   Eosinophils Absolute 12/15/2023 0.1  0.0 - 0.5 K/uL Final   Basophils Relative 12/15/2023 1  % Final   Basophils Absolute 12/15/2023 0.0  0.0 - 0.1 K/uL Final   Immature Granulocytes 12/15/2023 0  % Final   Abs Immature Granulocytes 12/15/2023 0.01  0.00 - 0.07 K/uL Final   Performed at [facility], [address], [place], [place] [address]   Sodium 12/15/2023 136  135 - 145 mmol/L Final   Potassium 12/15/2023 4.2  3.5 - 5.1 mmol/L Final   Chloride 12/15/2023 106  98 - 111 mmol/L Final   CO2 12/15/2023 21 (L)  22 - 32 mmol/L Final   Glucose, Bld 12/15/2023 77  70 - 99 mg/dL Final   Glucose reference range applies only to samples taken after fasting for at least 8 hours.   BUN 12/15/2023 23 (H)  6 - 20 mg/dL Final   Creatinine, Ser 12/15/2023 0.72  0.44 - 1.00 mg/dL Final   Calcium  12/15/2023 8.2 (L)  8.9 - 10.3 mg/dL Final   Total Protein 11/91/4782 5.8 (L)  6.5 - 8.1 g/dL Final   Albumin 95/62/1308 2.5 (L)  3.5 - 5.0 g/dL Final   AST 65/78/4696 40  15 - 41 U/L Final   ALT 12/15/2023 36  0 - 44 U/L Final   Alkaline Phosphatase 12/15/2023 40  38 - 126 U/L Final   Total Bilirubin 12/15/2023 0.5  0.0 - 1.2 mg/dL  Final   GFR, Estimated 12/15/2023 >60  >60 mL/min Final   Comment: (NOTE) Calculated using the CKD-EPI Creatinine Equation (2021)    Anion gap 12/15/2023 9  5 - 15 Final   Performed at [facility], [address], [place], [place] [address]   Hgb A1c MFr Bld 12/15/2023 4.9  4.8 - 5.6 % Final   Comment: (NOTE) Pre diabetes:          5.7%-6.4%  Diabetes:              >6.4%  Glycemic control for   <7.0% adults with diabetes    Mean Plasma Glucose 12/15/2023 93.93  mg/dL Final   Performed at [facility], [address], [place], [place] [address]   Alcohol, Ethyl (B) 12/15/2023 <15  <15 mg/dL Final   Comment: Please note change in reference range. (NOTE) For medical purposes only. Performed at [facility], [address] [place], [place] [address]    RPR Ser Ql 12/15/2023 NON REACTIVE  NON REACTIVE Final   Performed at [facility], [address], [place], [place] [address]   HIV Screen 4th Generation wRfx 12/15/2023 Non Reactive  Non Reactive Final   Performed  at [facility], [address] [place], [place] [address]   Cholesterol 12/15/2023 133  0 - 200 mg/dL Final   Triglycerides 19/14/7829 34  <150 mg/dL Final   HDL 56/21/3086 57  >40 mg/dL Final   Total CHOL/HDL Ratio 12/15/2023 2.3  RATIO Final   VLDL 12/15/2023 7  0 - 40 mg/dL Final   LDL Cholesterol 12/15/2023 69  0 - 99 mg/dL Final   Comment:        Total Cholesterol/HDL:CHD Risk Coronary Heart Disease Risk Table                     Men   Women  1/2 Average Risk   3.4   3.3  Average Risk       5.0   4.4  2 X Average Risk   9.6   7.1  3 X Average Risk  23.4   11.0        Use the calculated Patient Ratio above and the CHD Risk Table to determine the patient's CHD Risk.        ATP III CLASSIFICATION (LDL):  <100     mg/dL   Optimal  578-469  mg/dL   Near or Above                    Optimal  130-159  mg/dL   Borderline  629-528  mg/dL   High  >413     mg/dL   Very  High Performed at [facility], [address] [place], [place] [address]    TSH 12/15/2023 2.653  0.350 - 4.500 uIU/mL Final   Comment: Performed by a 3rd Generation assay with a functional sensitivity of <=0.01 uIU/mL. Performed at [facility], [address], [place], [place] [address]   Admission on 12/14/2023, Discharged on 12/14/2023  Component Date Value Ref Range Status   Neisseria Gonorrhea 12/14/2023 Negative   Final   Chlamydia 12/14/2023 Negative   Final   Comment 12/14/2023 Normal Reference Ranger Chlamydia - Negative   Final   Comment 12/14/2023 Normal Reference Range Neisseria Gonorrhea - Negative   Final   Preg Test, Ur 12/14/2023 Negative  Negative Final   POC Amphetamine  UR 12/14/2023 None Detected  NONE DETECTED (Cut Off Level 1000 ng/mL) Final   POC Secobarbital (BAR) 12/14/2023 None Detected  NONE DETECTED (Cut Off Level 300 ng/mL) Final   POC Buprenorphine  (BUP) 12/14/2023 None Detected  NONE DETECTED (Cut Off Level 10 ng/mL) Final   POC Oxazepam (BZO) 12/14/2023 None Detected  NONE DETECTED (Cut Off Level 300 ng/mL) Final   POC Cocaine UR 12/14/2023 Positive (A)  NONE DETECTED (Cut Off Level 300 ng/mL) Final   POC Methamphetamine UR 12/14/2023 None Detected  NONE DETECTED (Cut Off Level 1000 ng/mL) Final   POC Morphine  12/14/2023 None Detected  NONE DETECTED (Cut Off Level 300 ng/mL) Final   POC Methadone UR 12/14/2023 None Detected  NONE DETECTED (Cut Off Level 300 ng/mL) Final   POC Oxycodone  UR 12/14/2023 Positive (A)  NONE DETECTED (Cut Off Level 100 ng/mL) Final   POC Marijuana UR 12/14/2023 Positive (A)  NONE DETECTED (Cut Off Level 50 ng/mL) Final  Admission on 11/20/2023, Discharged on 11/20/2023  Component Date Value Ref Range Status   Sodium 11/20/2023 138  135 - 145 mmol/L Final   Potassium 11/20/2023 3.5  3.5 - 5.1 mmol/L Final   Chloride 11/20/2023 102  98 - 111 mmol/L Final  CO2 11/20/2023 25  22 - 32 mmol/L Final   Glucose, Bld  11/20/2023 89  70 - 99 mg/dL Final   Glucose reference range applies only to samples taken after fasting for at least 8 hours.   BUN 11/20/2023 9  6 - 20 mg/dL Final   Creatinine, Ser 11/20/2023 0.87  0.44 - 1.00 mg/dL Final   Calcium  11/20/2023 9.5  8.9 - 10.3 mg/dL Final   Total Protein 40/98/1191 7.7  6.5 - 8.1 g/dL Final   Albumin 47/82/9562 3.5  3.5 - 5.0 g/dL Final   AST 13/03/6577 81 (H)  15 - 41 U/L Final   ALT 11/20/2023 60 (H)  0 - 44 U/L Final   Alkaline Phosphatase 11/20/2023 62  38 - 126 U/L Final   Total Bilirubin 11/20/2023 0.5  0.0 - 1.2 mg/dL Final   GFR, Estimated 11/20/2023 >60  >60 mL/min Final   Comment: (NOTE) Calculated using the CKD-EPI Creatinine Equation (2021)    Anion gap 11/20/2023 11  5 - 15 Final   Performed at [facility], [address], [place], [place] [address]   WBC 11/20/2023 6.2  4.0 - 10.5 K/uL Final   RBC 11/20/2023 4.02  3.87 - 5.11 MIL/uL Final   Hemoglobin 11/20/2023 12.8  12.0 - 15.0 g/dL Final   HCT 95/28/4132 39.0  36.0 - 46.0 % Final   MCV 11/20/2023 97.0  80.0 - 100.0 fL Final   MCH 11/20/2023 31.8  26.0 - 34.0 pg Final   MCHC 11/20/2023 32.8  30.0 - 36.0 g/dL Final   RDW 44/08/270 14.4  11.5 - 15.5 % Final   Platelets 11/20/2023 164  150 - 400 K/uL Final   nRBC 11/20/2023 0.0  0.0 - 0.2 % Final   Neutrophils Relative % 11/20/2023 53  % Final   Neutro Abs 11/20/2023 3.3  1.7 - 7.7 K/uL Final   Lymphocytes Relative 11/20/2023 33  % Final   Lymphs Abs 11/20/2023 2.1  0.7 - 4.0 K/uL Final   Monocytes Relative 11/20/2023 8  % Final   Monocytes Absolute 11/20/2023 0.5  0.1 - 1.0 K/uL Final   Eosinophils Relative 11/20/2023 5  % Final   Eosinophils Absolute 11/20/2023 0.3  0.0 - 0.5 K/uL Final   Basophils Relative 11/20/2023 1  % Final   Basophils Absolute 11/20/2023 0.1  0.0 - 0.1 K/uL Final   Immature Granulocytes 11/20/2023 0  % Final   Abs Immature Granulocytes 11/20/2023 0.02  0.00 - 0.07 K/uL Final   Performed at [facility], [address], [place], [place] [address]   Alcohol, Ethyl (B) 11/20/2023 144 (H)  <10 mg/dL Final   Comment: (NOTE) Lowest detectable limit for serum alcohol is 10 mg/dL.  For medical purposes only. Performed at [facility], [address] [place], [place] [address]    Acetaminophen  (Tylenol ), Serum 11/20/2023 <10 (L)  10 - 30 ug/mL Final   Comment: (NOTE) Therapeutic concentrations vary significantly. A range of 10-30 ug/mL  may be an effective concentration for many patients. However, some  are best treated at concentrations outside of this range. Acetaminophen  concentrations >150 ug/mL at 4 hours after ingestion  and >50 ug/mL at 12 hours after ingestion are often associated with  toxic reactions.  Performed at [facility], [address], [place], [place] [address]    Specimen Source 11/20/2023 URINE, CLEAN CATCH   Final   Color, Urine 11/20/2023 YELLOW  YELLOW Final  APPearance 11/20/2023 HAZY (A)  CLEAR Final   Specific Gravity, Urine 11/20/2023 1.008  1.005 - 1.030 Final   pH 11/20/2023 6.0  5.0 - 8.0 Final   Glucose, UA 11/20/2023 NEGATIVE  NEGATIVE mg/dL Final   Hgb urine dipstick 11/20/2023 SMALL (A)  NEGATIVE Final   Bilirubin Urine 11/20/2023 NEGATIVE  NEGATIVE Final   Ketones, ur 11/20/2023 NEGATIVE  NEGATIVE mg/dL Final   Protein, ur 96/29/5284 NEGATIVE  NEGATIVE mg/dL Final   Nitrite 13/24/4010 NEGATIVE  NEGATIVE Final   Leukocytes,Ua 11/20/2023 NEGATIVE  NEGATIVE Final   RBC / HPF 11/20/2023 0-5  0 - 5 RBC/hpf Final   WBC, UA 11/20/2023 0-5  0 - 5 WBC/hpf Final   Comment:        Reflex urine culture not performed if WBC <=10, OR if Squamous epithelial cells >5. If Squamous epithelial cells >5 suggest recollection.    Bacteria, UA 11/20/2023 RARE (A)  NONE SEEN Final   Squamous Epithelial / HPF 11/20/2023 0-5  0 - 5 /HPF Final   Mucus 11/20/2023 PRESENT   Final   Performed at [facility], [address],  [place], [place] [address]   Preg, Serum 11/20/2023 NEGATIVE  NEGATIVE Final   Comment:        THE SENSITIVITY OF THIS METHODOLOGY IS >10 mIU/mL. Performed at [facility], [address] [place], [place] [address]   Admission on 07/28/2023, Discharged on 07/28/2023  Component Date Value Ref Range Status   Sodium 07/28/2023 136  135 - 145 mmol/L Final   Potassium 07/28/2023 3.9  3.5 - 5.1 mmol/L Final   Chloride 07/28/2023 104  98 - 111 mmol/L Final   CO2 07/28/2023 22  22 - 32 mmol/L Final   Glucose, Bld 07/28/2023 90  70 - 99 mg/dL Final   Glucose reference range applies only to samples taken after fasting for at least 8 hours.   BUN 07/28/2023 10  6 - 20 mg/dL Final   Creatinine, Ser 07/28/2023 0.98  0.44 - 1.00 mg/dL Final   Calcium  07/28/2023 8.9  8.9 - 10.3 mg/dL Final   Total Protein 34/74/2595 7.3  6.5 - 8.1 g/dL Final   Albumin 63/87/5643 3.1 (L)  3.5 - 5.0 g/dL Final   AST 32/95/1884 47 (H)  15 - 41 U/L Final   ALT 07/28/2023 35  0 - 44 U/L Final   Alkaline Phosphatase 07/28/2023 64  38 - 126 U/L Final   Total Bilirubin 07/28/2023 0.5  <1.2 mg/dL Final   GFR, Estimated 07/28/2023 >60  >60 mL/min Final   Comment: (NOTE) Calculated using the CKD-EPI Creatinine Equation (2021)    Anion gap 07/28/2023 10  5 - 15 Final   Performed at [facility], [address] [place], [place] [address]   Alcohol, Ethyl (B) 07/28/2023 <10  <10 mg/dL Final   Comment: (NOTE) Lowest detectable limit for serum alcohol is 10 mg/dL.  For medical purposes only. Performed at [facility], [address] [place], [place] [address]    Salicylate Lvl 07/28/2023 <7.0 (L)  7.0 - 30.0 mg/dL Final   Performed at [facility], [address], [place], [place] [address]   Acetaminophen  (Tylenol ), Serum 07/28/2023 23  10 - 30 ug/mL Final   Comment: (NOTE) Therapeutic concentrations vary significantly. A range of 10-30 ug/mL  may be an effective concentration for many patients.  However, some  are best treated at concentrations outside of this range. Acetaminophen   concentrations >150 ug/mL at 4 hours after ingestion  and >50 ug/mL at 12 hours after ingestion are often associated with  toxic reactions.  Performed at [facility], [address] [place], [place] [address]    WBC 07/28/2023 5.7  4.0 - 10.5 K/uL Final   RBC 07/28/2023 3.52 (L)  3.87 - 5.11 MIL/uL Final   Hemoglobin 07/28/2023 11.1 (L)  12.0 - 15.0 g/dL Final   HCT 60/45/4098 34.3 (L)  36.0 - 46.0 % Final   MCV 07/28/2023 97.4  80.0 - 100.0 fL Final   MCH 07/28/2023 31.5  26.0 - 34.0 pg Final   MCHC 07/28/2023 32.4  30.0 - 36.0 g/dL Final   RDW 11/91/4782 14.8  11.5 - 15.5 % Final   Platelets 07/28/2023 216  150 - 400 K/uL Final   nRBC 07/28/2023 0.0  0.0 - 0.2 % Final   Performed at [facility], [address] [place], [place] [address]   Opiates 07/28/2023 NONE DETECTED  NONE DETECTED Final   Cocaine 07/28/2023 NONE DETECTED  NONE DETECTED Final   Benzodiazepines 07/28/2023 NONE DETECTED  NONE DETECTED Final   Amphetamines 07/28/2023 NONE DETECTED  NONE DETECTED Final   Tetrahydrocannabinol 07/28/2023 NONE DETECTED  NONE DETECTED Final   Barbiturates 07/28/2023 NONE DETECTED  NONE DETECTED Final   Comment: (NOTE) DRUG SCREEN FOR MEDICAL PURPOSES ONLY.  IF CONFIRMATION IS NEEDED FOR ANY PURPOSE, NOTIFY LAB WITHIN 5 DAYS.  LOWEST DETECTABLE LIMITS FOR URINE DRUG SCREEN Drug Class                     Cutoff (ng/mL) Amphetamine  and metabolites    1000 Barbiturate and metabolites    200 Benzodiazepine                 200 Opiates and metabolites        300 Cocaine and metabolites        300 THC                            50 Performed at [facility] Lab, [address] [place], [place] [address]    Preg, Serum 07/28/2023 NEGATIVE  NEGATIVE Final   Comment:        THE SENSITIVITY OF THIS METHODOLOGY IS >10 mIU/mL. Performed at [facility], [address]  [place], [place] [address]    Acetaminophen  (Tylenol ), Serum 07/28/2023 <10 (L)  10 - 30 ug/mL Final   Comment: (NOTE) Therapeutic concentrations vary significantly. A range of 10-30 ug/mL  may be an effective concentration for many patients. However, some  are best treated at concentrations outside of this range. Acetaminophen  concentrations >150 ug/mL at 4 hours after ingestion  and >50 ug/mL at 12 hours after ingestion are often associated with  toxic reactions.  Performed at [facility], [address] [place], [place] [address]   Admission on 07/26/2023, Discharged on 07/27/2023  Component Date Value Ref Range Status   Sodium 07/26/2023 137  135 - 145 mmol/L Final   Potassium 07/26/2023 4.5  3.5 - 5.1 mmol/L Final   Chloride 07/26/2023 106  98 - 111 mmol/L Final   CO2 07/26/2023 23  22 - 32 mmol/L Final   Glucose, Bld 07/26/2023 82  70 - 99 mg/dL Final   Glucose reference range applies only to samples taken after fasting for at least 8 hours.   BUN 07/26/2023  9  6 - 20 mg/dL Final   Creatinine, Ser 07/26/2023 0.96  0.44 - 1.00 mg/dL Final   Calcium  07/26/2023 8.9  8.9 - 10.3 mg/dL Final   Total Protein 56/21/3086 7.3  6.5 - 8.1 g/dL Final   Albumin 57/84/6962 3.1 (L)  3.5 - 5.0 g/dL Final   AST 95/28/4132 52 (H)  15 - 41 U/L Final   ALT 07/26/2023 37  0 - 44 U/L Final   Alkaline Phosphatase 07/26/2023 62  38 - 126 U/L Final   Total Bilirubin 07/26/2023 0.5  <1.2 mg/dL Final   GFR, Estimated 07/26/2023 >60  >60 mL/min Final   Comment: (NOTE) Calculated using the CKD-EPI Creatinine Equation (2021)    Anion gap 07/26/2023 8  5 - 15 Final   Performed at [facility], [address], [place], [place] [address]   Alcohol, Ethyl (B) 07/26/2023 <10  <10 mg/dL Final   Comment: (NOTE) Lowest detectable limit for serum alcohol is 10 mg/dL.  For medical purposes only. Performed at [facility], [address], [place], [place] [address]    WBC 07/26/2023 8.9  4.0 -  10.5 K/uL Final   RBC 07/26/2023 3.47 (L)  3.87 - 5.11 MIL/uL Final   Hemoglobin 07/26/2023 10.8 (L)  12.0 - 15.0 g/dL Final   HCT 66/44/0347 34.0 (L)  36.0 - 46.0 % Final   MCV 07/26/2023 98.0  80.0 - 100.0 fL Final   MCH 07/26/2023 31.1  26.0 - 34.0 pg Final   MCHC 07/26/2023 31.8  30.0 - 36.0 g/dL Final   RDW 42/59/5638 14.7  11.5 - 15.5 % Final   Platelets 07/26/2023 195  150 - 400 K/uL Final   nRBC 07/26/2023 0.0  0.0 - 0.2 % Final   Performed at [facility], [address], [place], [place] [address]   Preg, Serum 07/26/2023 NEGATIVE  NEGATIVE Final   Comment:        THE SENSITIVITY OF THIS METHODOLOGY IS >10 mIU/mL. Performed at [facility], [address], [place], [place] [address]    Opiates 07/26/2023 NONE DETECTED  NONE DETECTED Final   Cocaine 07/26/2023 NONE DETECTED  NONE DETECTED Final   Benzodiazepines 07/26/2023 NONE DETECTED  NONE DETECTED Final   Amphetamines 07/26/2023 NONE DETECTED  NONE DETECTED Final   Tetrahydrocannabinol 07/26/2023 NONE DETECTED  NONE DETECTED Final   Barbiturates 07/26/2023 NONE DETECTED  NONE DETECTED Final   Comment: (NOTE) DRUG SCREEN FOR MEDICAL PURPOSES ONLY.  IF CONFIRMATION IS NEEDED FOR ANY PURPOSE, NOTIFY LAB WITHIN 5 DAYS.  LOWEST DETECTABLE LIMITS FOR URINE DRUG SCREEN Drug Class                     Cutoff (ng/mL) Amphetamine  and metabolites    1000 Barbiturate and metabolites    200 Benzodiazepine                 200 Opiates and metabolites        300 Cocaine and metabolites        300 THC                            50 Performed at [facility], [address], [place], [place] [address]   Admission on 07/22/2023, Discharged on 07/22/2023  Component Date Value Ref Range Status   SARS Coronavirus 2 by RT PCR 07/22/2023 NEGATIVE  NEGATIVE Final  Influenza A by PCR 07/22/2023 NEGATIVE  NEGATIVE Final   Influenza B by PCR 07/22/2023 NEGATIVE  NEGATIVE Final   Comment: (NOTE) The Xpert Xpress  SARS-CoV-2/FLU/RSV plus assay is intended as an aid in the diagnosis of influenza from Nasopharyngeal swab specimens and should not be used as a sole basis for treatment. Nasal washings and aspirates are unacceptable for Xpert Xpress SARS-CoV-2/FLU/RSV testing.  Fact Sheet for Patients: BloggerCourse.com  Fact Sheet for Healthcare Providers: SeriousBroker.it  This test is not yet approved or cleared by the [place]  FDA and has been authorized for detection and/or diagnosis of SARS-CoV-2 by FDA under an Emergency Use Authorization (EUA). This EUA will remain in effect (meaning this test can be used) for the duration of the COVID-19 declaration under Section 564(b)(1) of the Act, 21 U.S.C. section 360bbb-3(b)(1), unless the authorization is terminated or revoked.     Resp Syncytial Virus by PCR 07/22/2023 NEGATIVE  NEGATIVE Final   Comment: (NOTE) Fact Sheet for Patients: BloggerCourse.com  Fact Sheet for Healthcare Providers: SeriousBroker.it  This test is not yet approved or cleared by the [place]  FDA and has been authorized for detection and/or diagnosis of SARS-CoV-2 by FDA under an Emergency Use Authorization (EUA). This EUA will remain in effect (meaning this test can be used) for the duration of the COVID-19 declaration under Section 564(b)(1) of the Act, 21 U.S.C. section 360bbb-3(b)(1), unless the authorization is terminated or revoked.  Performed at [facility], [address], [place], [place] [address]    Sodium 07/22/2023 135  135 - 145 mmol/L Final   Potassium 07/22/2023 4.3  3.5 - 5.1 mmol/L Final   Chloride 07/22/2023 106  98 - 111 mmol/L Final   CO2 07/22/2023 22  22 - 32 mmol/L Final   Glucose, Bld 07/22/2023 111 (H)  70 - 99 mg/dL Final   Glucose reference range applies only to samples taken after fasting for at least 8 hours.   BUN  07/22/2023 12  6 - 20 mg/dL Final   Creatinine, Ser 07/22/2023 0.85  0.44 - 1.00 mg/dL Final   Calcium  07/22/2023 9.3  8.9 - 10.3 mg/dL Final   Total Protein 30/86/5784 6.8  6.5 - 8.1 g/dL Final   Albumin 69/62/9528 3.0 (L)  3.5 - 5.0 g/dL Final   AST 41/32/4401 36  15 - 41 U/L Final   ALT 07/22/2023 26  0 - 44 U/L Final   Alkaline Phosphatase 07/22/2023 56  38 - 126 U/L Final   Total Bilirubin 07/22/2023 0.5  <1.2 mg/dL Final   GFR, Estimated 07/22/2023 >60  >60 mL/min Final   Comment: (NOTE) Calculated using the CKD-EPI Creatinine Equation (2021)    Anion gap 07/22/2023 7  5 - 15 Final   Performed at [facility], [address] [place], [place] [address]   WBC 07/22/2023 7.4  4.0 - 10.5 K/uL Final   RBC 07/22/2023 3.42 (L)  3.87 - 5.11 MIL/uL Final   Hemoglobin 07/22/2023 10.6 (L)  12.0 - 15.0 g/dL Final   HCT 36/64/4034 33.5 (L)  36.0 - 46.0 % Final   MCV 07/22/2023 98.0  80.0 - 100.0 fL Final   MCH 07/22/2023 31.0  26.0 - 34.0 pg Final   MCHC 07/22/2023 31.6  30.0 - 36.0 g/dL Final   RDW 74/25/9563 15.2  11.5 - 15.5 % Final   Platelets 07/22/2023 158  150 - 400 K/uL Final   nRBC 07/22/2023 0.0  0.0 - 0.2 % Final  Performed at [facility], [address], [place], [place] [address]   Preg, Serum 07/22/2023 NEGATIVE  NEGATIVE Final   Comment:        THE SENSITIVITY OF THIS METHODOLOGY IS >10 mIU/mL. Performed at [facility], [address] [place], [place] [address]    Total CK 07/22/2023 268 (H)  38 - 234 U/L Final   Performed at [facility], [address], [place], [place] [address]   Color, Urine 07/22/2023 STRAW (A)  YELLOW Final   APPearance 07/22/2023 HAZY (A)  CLEAR Final   Specific Gravity, Urine 07/22/2023 1.005  1.005 - 1.030 Final   pH 07/22/2023 6.0  5.0 - 8.0 Final   Glucose, UA 07/22/2023 NEGATIVE  NEGATIVE mg/dL Final   Hgb urine dipstick 07/22/2023 SMALL (A)  NEGATIVE Final   Bilirubin Urine 07/22/2023 NEGATIVE  NEGATIVE Final    Ketones, ur 07/22/2023 NEGATIVE  NEGATIVE mg/dL Final   Protein, ur 95/62/1308 NEGATIVE  NEGATIVE mg/dL Final   Nitrite 65/78/4696 NEGATIVE  NEGATIVE Final   Leukocytes,Ua 07/22/2023 LARGE (A)  NEGATIVE Final   RBC / HPF 07/22/2023 0-5  0 - 5 RBC/hpf Final   WBC, UA 07/22/2023 6-10  0 - 5 WBC/hpf Final   Bacteria, UA 07/22/2023 RARE (A)  NONE SEEN Final   Squamous Epithelial / HPF 07/22/2023 0-5  0 - 5 /HPF Final   Performed at [facility], [address] [place], [place] [address]   Alcohol, Ethyl (B) 07/22/2023 <10  <10 mg/dL Final   Comment: (NOTE) Lowest detectable limit for serum alcohol is 10 mg/dL.  For medical purposes only. Performed at [facility], [address], [place], [place] [address]     Allergies: Gramineae pollens, Tylenol  [acetaminophen ], Lisinopril , and Mirtazapine   Medications:  Facility Ordered Medications  Medication   alum & mag hydroxide-simeth (MAALOX/MYLANTA) 200-200-20 MG/5ML suspension 30 mL   magnesium  hydroxide (MILK OF MAGNESIA) suspension 30 mL   haloperidol  (HALDOL ) tablet 5 mg   And   diphenhydrAMINE  (BENADRYL ) capsule 50 mg   haloperidol  lactate (HALDOL ) injection 5 mg   And   diphenhydrAMINE  (BENADRYL ) injection 50 mg   And   LORazepam  (ATIVAN ) injection 2 mg   haloperidol  lactate (HALDOL ) injection 10 mg   And   diphenhydrAMINE  (BENADRYL ) injection 50 mg   And   LORazepam  (ATIVAN ) injection 2 mg   hydrOXYzine  (ATARAX ) tablet 25 mg   PTA Medications  Medication Sig   doxycycline  (VIBRA -TABS) 100 MG tablet Take 1 tablet (100 mg total) by mouth 2 (two) times daily.   cefadroxil  (DURICEF) 500 MG capsule Take 1 capsule (500 mg total) by mouth 2 (two) times daily for 7 days.      Medical Decision Making  Lab Orders         CBC with Differential/Platelet         Comprehensive metabolic panel         Ethanol         Magnesium          Urinalysis, Routine w reflex microscopic -Urine, Clean Catch         Lithium   level         POC urine preg, ED         POCT Urine Drug Screen - (I-Screen)     EKG    Recommendations  Based on my evaluation the patient does not appear to have an emergency medical condition. Initial plan was  to admit to OBS. Due to patient having recent diagnosis of MRSA, she is unable to be admitted to [facility] Obs unit due to inability to use community shower. She will be sent to the ED until inpatient bed can be located.   Patient report called to Dr Aldean Amass at [facility] ED   Patient to transport to [facility] ED via [address]  Addie Holstein, PMHNP-BC, [facility]  01/06/24  1955

## 2024-01-07 MED ORDER — IBUPROFEN 800 MG PO TABS
800.0000 mg | ORAL_TABLET | Freq: Once | ORAL | Status: AC
  Administered 2024-01-07: 800 mg via ORAL
  Filled 2024-01-07: qty 1

## 2024-01-07 NOTE — ED Provider Notes (Signed)
 Emergency Medicine Observation Re-evaluation Note  Judy Robles is a 48 y.o. female, seen on rounds today.  Pt initially presented to the ED for complaints of Alcohol Problem Currently, the patient is resting.  Physical Exam  BP (!) 125/115   Pulse 61   Temp 98 F (36.7 C)   Resp 15   Ht 5\' 1"  (1.549 m)   Wt 93 kg   SpO2 99%   BMI 38.73 kg/m  Physical Exam General: resting comfortably, NAD Lungs: normal WOB Psych: currently calm and resting  ED Course / MDM  EKG:   I have reviewed the labs performed to date as well as medications administered while in observation.  Recent changes in the last 24 hours include none.  Plan  Current plan is for inpatient admission.    Flonnie Humphrey, DO 01/07/24 1320

## 2024-01-08 DIAGNOSIS — F1910 Other psychoactive substance abuse, uncomplicated: Secondary | ICD-10-CM | POA: Diagnosis present

## 2024-01-08 MED ORDER — IBUPROFEN 800 MG PO TABS
800.0000 mg | ORAL_TABLET | Freq: Once | ORAL | Status: AC
  Administered 2024-01-08: 800 mg via ORAL
  Filled 2024-01-08: qty 1

## 2024-01-08 NOTE — ED Notes (Signed)
 Patient discharged off unit to home per provider. Patient alert, calm, cooperative, no s/s  of distress at this time. Discharge information given to patient. Belongings given to patient. Patient ambulatory off unit, escorted by RN. Patient given bus pass for transportation.

## 2024-01-08 NOTE — ED Provider Notes (Signed)
 Emergency Medicine Observation Re-evaluation Note  Judy Robles is a 48 y.o. female, seen on rounds today.  Pt initially presented to the ED for complaints of substance use disorder, and housing instability. Pt with recent [facility] and [facility] stay - did not maintain sobriety. Notes chronic stress related to housing instability. No new/acute stressors. Pt reports feeling improved this AM. Normal appetite.  Physical Exam  BP (!) 141/60   Pulse 61   Temp 98 F (36.7 C) (Oral)   Resp 17   Ht 1.549 m (5\' 1" )   Wt 93 kg   SpO2 99%   BMI 38.73 kg/m  Physical Exam General: resting comfortably, easily aroused. Nad.  Cardiac: regular rate.  Lungs: breathing comfortably. Psych: normal mood and affect. Pt does not appear acutely depressed or despondent. Pt denies desire or plan to harm self or others. Does not appear to be responding to internal stimuli. No delusions or hallucinations noted, no acute psychosis. Neuro: calm, conversant. No tremor or shakes.   ED Course / MDM    I have reviewed the labs performed to date as well as medications administered while in observation.  Recent changes in the last 24 hours include ED obs, reassessment.   Plan  Pt without acute psychosis. No current indication of acute or severe etoh withdrawal - no tremor or shakes, calm, resting, normal appetite, no nvd, hr 60, rr 14 (pt notes episodic binge drinking, denies heavy/every day use).   No current indication for inpatient medical admission, and from [facility] standpoint no acute psychosis. Pt appears to have ongoing challenges with SUD, and is encouraged to maintain sobriety - will also provide community resources for both inpatient/outpatient programs. Will also provide additional bh resources/bhuc for prompt outpatient BH follow up. Will provide shelter/social services resources as well.   Pt currently appears stable for ED d/c.   Return precautions provided.     Guadalupe Lee, MD 01/08/24 1141

## 2024-01-08 NOTE — Consult Note (Addendum)
 Patient was transferred in from [facility] to [facility] ER for alcohol detox treatment on Friday.  Patient was transferred because of testing Positive for MRSA six days ago.  Patient was seen this morning by provider and she denied SI/HI/AVH but requested detox treatment from Alcohol.  There is no Alcohol level in the system instead her UDS is positive for Amphetamine , Cocaine, Cannabis and Benzodiazepine.  Patient was seen this morning by this provider.  She was sleeping but easily awoken.  She admitted using multiple drugs and Alcohol.  Patient does not have outpatient Psychiatrist and is not taking Medications.  Patient states she is homeless and that she uses her little money for drugs  and alcohol.  Patient did not exhibit any withdrawal symptoms but was sleeping and eating in the ER.  She is calm, no tremors, no sweats and no shakes.  Mood is calm and she is oriented x4.  The EDP cleared patient for discharge and dished her. Outpatient resources for substance and Alcohol abuse given to patient.

## 2024-01-08 NOTE — ED Provider Notes (Signed)
 Emergency Medicine Observation Re-evaluation Note  Judy Robles is a 48 y.o. female, seen on rounds today.  Pt initially presented to the ED for complaints of Alcohol Problem Currently, the patient is sleeping.  Physical Exam  BP (!) 141/60   Pulse 61   Temp 98 F (36.7 C) (Oral)   Resp 17   Ht 5\' 1"  (1.549 m)   Wt 93 kg   SpO2 99%   BMI 38.73 kg/m  Physical Exam General: Sleeping Cardiac: Extremities well-perfused Lungs: Breathing is unlabored Psych: Deferred  ED Course / MDM  EKG:   I have reviewed the labs performed to date as well as medications administered while in observation.  Recent changes in the last 24 hours include none.  Plan  [facility].    Iva Mariner, MD 01/08/24 [phone]

## 2024-01-08 NOTE — ED Notes (Signed)
 [facility] provided pt with resources for shelter, free meals, medical and mental health treatment and outpatient substance abuse treatment programs.  Patt Boozer, [facility]  01/08/24

## 2024-01-08 NOTE — Discharge Instructions (Addendum)
 It was our pleasure to provide your ER care today - we hope that you feel better.  Avoid drug use as it is harmful to your physical health and mental well-being. See resource guide attached in terms of accessing inpatient or outpatient substance use treatment programs.   Follow up closely with primary care doctor and behavioral health provider in the coming week - see resource guide attached for behavioral health resources, as well as a variety of other area social service and shelter resources.  For mental health issues and/or crisis, you may also go directly to the Behavioral Health Urgent Care Center - they are open 24/7 and walk-ins are welcome.    Return to ER if worse, new symptoms, fevers, chest pain, trouble breathing, spreading redness/increased swelling/worsening skin infection, or other emergency concern.

## 2024-04-27 ENCOUNTER — Other Ambulatory Visit (HOSPITAL_COMMUNITY)
Admission: EM | Admit: 2024-04-27 | Discharge: 2024-05-02 | Disposition: A | Discharge status: Other Acute Inpatient Hospital | Source: Intra-hospital | Attending: Psychiatry | Admitting: Psychiatry

## 2024-04-27 ENCOUNTER — Ambulatory Visit (HOSPITAL_COMMUNITY)
Admission: EM | Admit: 2024-04-27 | Discharge: 2024-04-27 | Disposition: A | Discharge status: Other Acute Inpatient Hospital | Attending: Behavioral Health | Admitting: Behavioral Health

## 2024-04-27 DIAGNOSIS — F322 Major depressive disorder, single episode, severe without psychotic features: Secondary | ICD-10-CM | POA: Diagnosis present

## 2024-04-27 DIAGNOSIS — F333 Major depressive disorder, recurrent, severe with psychotic symptoms: Secondary | ICD-10-CM | POA: Diagnosis present

## 2024-04-27 DIAGNOSIS — F332 Major depressive disorder, recurrent severe without psychotic features: Secondary | ICD-10-CM | POA: Diagnosis present

## 2024-04-27 DIAGNOSIS — F1120 Opioid dependence, uncomplicated: Secondary | ICD-10-CM | POA: Diagnosis not present

## 2024-04-27 DIAGNOSIS — F1994 Other psychoactive substance use, unspecified with psychoactive substance-induced mood disorder: Secondary | ICD-10-CM

## 2024-04-27 DIAGNOSIS — F1190 Opioid use, unspecified, uncomplicated: Secondary | ICD-10-CM | POA: Diagnosis not present

## 2024-04-27 DIAGNOSIS — F1320 Sedative, hypnotic or anxiolytic dependence, uncomplicated: Secondary | ICD-10-CM | POA: Diagnosis present

## 2024-04-27 DIAGNOSIS — F1920 Other psychoactive substance dependence, uncomplicated: Secondary | ICD-10-CM | POA: Diagnosis not present

## 2024-04-27 DIAGNOSIS — Z5900 Homelessness unspecified: Secondary | ICD-10-CM | POA: Insufficient documentation

## 2024-04-27 DIAGNOSIS — F4310 Post-traumatic stress disorder, unspecified: Secondary | ICD-10-CM | POA: Diagnosis present

## 2024-04-27 DIAGNOSIS — F1410 Cocaine abuse, uncomplicated: Secondary | ICD-10-CM

## 2024-04-27 DIAGNOSIS — F329 Major depressive disorder, single episode, unspecified: Secondary | ICD-10-CM | POA: Insufficient documentation

## 2024-04-27 DIAGNOSIS — I10 Essential (primary) hypertension: Secondary | ICD-10-CM | POA: Insufficient documentation

## 2024-04-27 DIAGNOSIS — F1110 Opioid abuse, uncomplicated: Secondary | ICD-10-CM | POA: Diagnosis present

## 2024-04-27 DIAGNOSIS — F3181 Bipolar II disorder: Secondary | ICD-10-CM | POA: Diagnosis present

## 2024-04-27 DIAGNOSIS — R45851 Suicidal ideations: Secondary | ICD-10-CM | POA: Diagnosis not present

## 2024-04-27 DIAGNOSIS — F1910 Other psychoactive substance abuse, uncomplicated: Secondary | ICD-10-CM

## 2024-04-27 DIAGNOSIS — F17210 Nicotine dependence, cigarettes, uncomplicated: Secondary | ICD-10-CM | POA: Diagnosis not present

## 2024-04-27 DIAGNOSIS — F1114 Opioid abuse with opioid-induced mood disorder: Secondary | ICD-10-CM | POA: Diagnosis present

## 2024-04-27 DIAGNOSIS — F19982 Other psychoactive substance use, unspecified with psychoactive substance-induced sleep disorder: Secondary | ICD-10-CM | POA: Diagnosis present

## 2024-04-27 DIAGNOSIS — F1090 Alcohol use, unspecified, uncomplicated: Secondary | ICD-10-CM | POA: Diagnosis present

## 2024-04-27 DIAGNOSIS — F331 Major depressive disorder, recurrent, moderate: Secondary | ICD-10-CM | POA: Diagnosis present

## 2024-04-27 LAB — CBC WITH DIFFERENTIAL/PLATELET
Abs Immature Granulocytes: 0.04 K/uL (ref 0.00–0.07)
Basophils Absolute: 0.1 K/uL (ref 0.0–0.1)
Basophils Relative: 1 %
Eosinophils Absolute: 0.1 K/uL (ref 0.0–0.5)
Eosinophils Relative: 1 %
HCT: 35.8 % — ABNORMAL LOW (ref 36.0–46.0)
Hemoglobin: 12.1 g/dL (ref 12.0–15.0)
Immature Granulocytes: 0 %
Lymphocytes Relative: 28 %
Lymphs Abs: 2.9 K/uL (ref 0.7–4.0)
MCH: 31.8 pg (ref 26.0–34.0)
MCHC: 33.8 g/dL (ref 30.0–36.0)
MCV: 94.2 fL (ref 80.0–100.0)
Monocytes Absolute: 0.8 K/uL (ref 0.1–1.0)
Monocytes Relative: 8 %
Neutro Abs: 6.5 K/uL (ref 1.7–7.7)
Neutrophils Relative %: 62 %
Platelets: 213 K/uL (ref 150–400)
RBC: 3.8 MIL/uL — ABNORMAL LOW (ref 3.87–5.11)
RDW: 13.1 % (ref 11.5–15.5)
WBC: 10.4 K/uL (ref 4.0–10.5)
nRBC: 0 % (ref 0.0–0.2)

## 2024-04-27 LAB — POCT URINE DRUG SCREEN - MANUAL ENTRY (I-SCREEN)
POC Amphetamine UR: NOT DETECTED
POC Buprenorphine (BUP): POSITIVE — AB
POC Cocaine UR: NOT DETECTED
POC Marijuana UR: NOT DETECTED
POC Methadone UR: NOT DETECTED
POC Methamphetamine UR: POSITIVE — AB
POC Morphine: NOT DETECTED
POC Oxazepam (BZO): NOT DETECTED
POC Oxycodone UR: POSITIVE — AB
POC Secobarbital (BAR): NOT DETECTED

## 2024-04-27 LAB — COMPREHENSIVE METABOLIC PANEL WITH GFR
ALT: 39 U/L (ref 0–44)
AST: 70 U/L — ABNORMAL HIGH (ref 15–41)
Albumin: 3.6 g/dL (ref 3.5–5.0)
Alkaline Phosphatase: 59 U/L (ref 38–126)
Anion gap: 14 (ref 5–15)
BUN: 19 mg/dL (ref 6–20)
CO2: 18 mmol/L — ABNORMAL LOW (ref 22–32)
Calcium: 8.7 mg/dL — ABNORMAL LOW (ref 8.9–10.3)
Chloride: 101 mmol/L (ref 98–111)
Creatinine, Ser: 1.36 mg/dL — ABNORMAL HIGH (ref 0.44–1.00)
GFR, Estimated: 47 mL/min — ABNORMAL LOW (ref >60)
Glucose, Bld: 94 mg/dL (ref 70–99)
Potassium: 3.5 mmol/L (ref 3.5–5.1)
Sodium: 133 mmol/L — ABNORMAL LOW (ref 135–145)
Total Bilirubin: 0.8 mg/dL (ref 0.0–1.2)
Total Protein: 7.5 g/dL (ref 6.5–8.1)

## 2024-04-27 LAB — VITAMIN D 25 HYDROXY (VIT D DEFICIENCY, FRACTURES): Vit D, 25-Hydroxy: 27.64 ng/mL — ABNORMAL LOW (ref 30–100)

## 2024-04-27 LAB — TSH: TSH: 5.052 u[IU]/mL — ABNORMAL HIGH (ref 0.350–4.500)

## 2024-04-27 LAB — T4, FREE: Free T4: 0.91 ng/dL (ref 0.61–1.12)

## 2024-04-27 LAB — LIPID PANEL
Cholesterol: 142 mg/dL (ref 0–200)
HDL: 53 mg/dL (ref >40)
LDL Cholesterol: 77 mg/dL (ref 0–99)
Total CHOL/HDL Ratio: 2.7 ratio
Triglycerides: 59 mg/dL (ref <150)
VLDL: 12 mg/dL (ref 0–40)

## 2024-04-27 LAB — HEMOGLOBIN A1C
Hgb A1c MFr Bld: 4.8 % (ref 4.8–5.6)
Mean Plasma Glucose: 91.06 mg/dL

## 2024-04-27 LAB — MAGNESIUM: Magnesium: 1.6 mg/dL — ABNORMAL LOW (ref 1.7–2.4)

## 2024-04-27 LAB — HIV ANTIBODY (ROUTINE TESTING W REFLEX): HIV Screen 4th Generation wRfx: NONREACTIVE

## 2024-04-27 LAB — ETHANOL: Alcohol, Ethyl (B): <15 mg/dL (ref <15)

## 2024-04-27 LAB — VITAMIN B12: Vitamin B-12: 369 pg/mL (ref 180–914)

## 2024-04-27 MED ORDER — LORAZEPAM 2 MG/ML IJ SOLN: 2.0000 mg | Freq: Three times a day (TID) | INTRAMUSCULAR | Status: DC | PRN

## 2024-04-27 MED ORDER — HALOPERIDOL LACTATE 5 MG/ML IJ SOLN: 5.0000 mg | Freq: Three times a day (TID) | INTRAMUSCULAR | Status: DC | PRN

## 2024-04-27 MED ORDER — HYDROXYZINE HCL 25 MG PO TABS
25.0000 mg | ORAL_TABLET | Freq: Three times a day (TID) | ORAL | Status: DC | PRN
  Administered 2024-04-27 – 2024-04-30 (×5): 25 mg via ORAL
  Filled 2024-04-27 (×5): qty 1

## 2024-04-27 MED ORDER — ALUM & MAG HYDROXIDE-SIMETH 200-200-20 MG/5ML PO SUSP: 30.0000 mL | ORAL | Status: DC | PRN

## 2024-04-27 MED ORDER — CLONIDINE HCL 0.1 MG PO TABS: 0.1000 mg | ORAL_TABLET | Freq: Every day | ORAL | Status: DC

## 2024-04-27 MED ORDER — ACETAMINOPHEN 325 MG PO TABS
650.0000 mg | ORAL_TABLET | Freq: Four times a day (QID) | ORAL | Status: DC | PRN
  Administered 2024-04-27 – 2024-05-02 (×5): 650 mg via ORAL
  Filled 2024-04-27 (×5): qty 2

## 2024-04-27 MED ORDER — HYDROXYZINE HCL 25 MG PO TABS: 25.0000 mg | ORAL_TABLET | Freq: Three times a day (TID) | ORAL | Status: DC | PRN

## 2024-04-27 MED ORDER — ACETAMINOPHEN 325 MG PO TABS: 650.0000 mg | ORAL_TABLET | Freq: Four times a day (QID) | ORAL | Status: DC | PRN

## 2024-04-27 MED ORDER — HALOPERIDOL 5 MG PO TABS: 5.0000 mg | ORAL_TABLET | Freq: Three times a day (TID) | ORAL | Status: DC | PRN

## 2024-04-27 MED ORDER — MAGNESIUM HYDROXIDE 400 MG/5ML PO SUSP: 30.0000 mL | Freq: Every day | ORAL | Status: DC | PRN

## 2024-04-27 MED ORDER — ONDANSETRON 4 MG PO TBDP: 4.0000 mg | ORAL_TABLET | Freq: Four times a day (QID) | ORAL | Status: AC | PRN

## 2024-04-27 MED ORDER — TRAZODONE HCL 50 MG PO TABS: 50.0000 mg | ORAL_TABLET | Freq: Every evening | ORAL | Status: DC | PRN

## 2024-04-27 MED ORDER — DIPHENHYDRAMINE HCL 50 MG PO CAPS: 50.0000 mg | ORAL_CAPSULE | Freq: Three times a day (TID) | ORAL | Status: DC | PRN

## 2024-04-27 MED ORDER — DICYCLOMINE HCL 20 MG PO TABS: 20.0000 mg | ORAL_TABLET | Freq: Four times a day (QID) | ORAL | Status: AC | PRN

## 2024-04-27 MED ORDER — TRAZODONE HCL 50 MG PO TABS
50.0000 mg | ORAL_TABLET | Freq: Every evening | ORAL | Status: DC | PRN
  Administered 2024-04-27 – 2024-04-29 (×3): 50 mg via ORAL
  Filled 2024-04-27 (×3): qty 1

## 2024-04-27 MED ORDER — CLONIDINE HCL 0.1 MG PO TABS
0.1000 mg | ORAL_TABLET | ORAL | Status: AC
  Administered 2024-04-30 – 2024-05-01 (×3): 0.1 mg via ORAL
  Filled 2024-04-27 (×4): qty 1

## 2024-04-27 MED ORDER — DIPHENHYDRAMINE HCL 50 MG/ML IJ SOLN: 50.0000 mg | Freq: Three times a day (TID) | INTRAMUSCULAR | Status: DC | PRN

## 2024-04-27 MED ORDER — NAPROXEN 500 MG PO TABS
500.0000 mg | ORAL_TABLET | Freq: Two times a day (BID) | ORAL | Status: AC | PRN
  Administered 2024-04-27 – 2024-05-02 (×4): 500 mg via ORAL
  Filled 2024-04-27 (×4): qty 1

## 2024-04-27 MED ORDER — METHOCARBAMOL 500 MG PO TABS
500.0000 mg | ORAL_TABLET | Freq: Three times a day (TID) | ORAL | Status: DC | PRN
  Administered 2024-04-27 – 2024-04-30 (×5): 500 mg via ORAL
  Filled 2024-04-27 (×5): qty 1

## 2024-04-27 MED ORDER — HALOPERIDOL LACTATE 5 MG/ML IJ SOLN: 10.0000 mg | Freq: Three times a day (TID) | INTRAMUSCULAR | Status: DC | PRN

## 2024-04-27 MED ORDER — CLONIDINE HCL 0.1 MG PO TABS
0.1000 mg | ORAL_TABLET | Freq: Four times a day (QID) | ORAL | Status: AC
  Administered 2024-04-27 – 2024-04-29 (×6): 0.1 mg via ORAL
  Filled 2024-04-27 (×9): qty 1

## 2024-04-27 MED ORDER — LOPERAMIDE HCL 2 MG PO CAPS: 2.0000 mg | ORAL_CAPSULE | ORAL | Status: AC | PRN

## 2024-04-27 NOTE — ED Notes (Signed)
 Pt admitted to [facility] requesting detox from opioids and crack/cocaine. Upon entering bldg, pt was endorsing SI with gesture of walking into traffic. Pt continue to endorse passive SI no plan. Pt states, I'm just tired of all of this. I need to leave that crack alone. It will kill you. Cooperative throughout interview process. Skin assessment completed. Oriented to unit. Meal and drink offered. At currrent, pt sitting in dayroom eating snack and interacting with peers. Pt verbally contract for safety. Will monitor for safety.
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
## 2024-04-27 NOTE — Group Note (Signed)
 Group Topic: Emotional Regulation  Group Date: 04/27/2024 Start Time: 1315 End Time: 1400 Facilitators: Laneta Renea POUR, NT  Department: [facility]  Number of Participants: 3  Group Focus: coping skills Treatment Modality:  Psychoeducation Interventions utilized were patient education Purpose: enhance coping skills and increase insight  Name: Judy Robles Date of Birth: August 14, 1976  MR: [id number]    Level of Participation: Was not on the unit  Quality of Participation: none Interactions with others: none Mood/Affect: none Triggers (if applicable): none Cognition: none Progress: None Response: none Plan: patient will be encouraged to attend groups.   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   MDD (major depressive disorder), severe (HCC) 12/18/2018   HCAP (healthcare-associated pneumonia) 09/16/2018   LLL pneumonia 09/16/2018   Sepsis (HCC) 09/16/2018   Hyponatremia 09/16/2018   Hypokalemia 09/16/2018   Severe benzodiazepine use disorder (HCC) 07/10/2018   Bipolar II disorder (HCC)    Severe recurrent major depression without psychotic features (HCC) 06/05/2018   PTSD (post-traumatic stress disorder) 11/21/2017   Major depressive disorder, recurrent episode, moderate (HCC) 11/05/2017   Community acquired pneumonia of right lower lobe of lung 09/02/2017   Sepsis associated hypotension (HCC) 09/02/2017   AKI (acute kidney injury) (HCC) 09/02/2017   Nausea and vomiting  09/02/2017   Drug overdose 04/22/2017   Chest pain 10/09/2016   Pressure injury of skin 10/05/2016   Polysubstance dependence including opioid type drug with complication, episodic abuse (HCC) 07/21/2016   Chronic pain 07/03/2015   Diarrhea 05/01/2014   Primary hypertension 04/17/2014

## 2024-04-27 NOTE — ED Notes (Signed)
 Patient is in the bedroom calm and sleeping. NAD Will monitor for safety.

## 2024-04-27 NOTE — ED Notes (Signed)
 Patient is in the bedroom calm and composed.NAD. Respirations even and unlabored. Environment secured per policy. Denies SI/HI/AVH. Will monitor for safety.

## 2024-04-27 NOTE — BH Assessment (Signed)
 Comprehensive Clinical Assessment (CCA) Note  04/27/2024 Judy Robles [id number]   Disposition: Per [employer], NP patient does meet inpatient criteria.  Disposition SW to pursue appropriate inpatient options.  The patient demonstrates the following risk factors for suicide: Chronic risk factors for suicide include: psychiatric disorder of MDD and substance use disorder. Acute risk factors for suicide include: family or marital conflict, unemployment, and loss (financial, interpersonal, professional). Protective factors for this patient include: seeking admission for treatment and seeking resources for housing,transportation, medication and financial. Considering these factors, the overall suicide risk at this point appears to be high. Patient is appropriate for outpatient follow up once stabilized.  Judy Robles is a 48 y.o. female patient with a documented psychiatric history significant for polysubstance abuse (opioid use, cocaine use, methamphetamine use, benzodiazepine, alcohol use), MDD, bipolar 2, and PTSD and a medical history of drug overdose, hypertension and restless leg syndrome who presented to the [facility] voluntary accompanied by law enforcement with complaints of suicidal thoughts with a plan, worsening depression and, anxiety and drug abuse.   Patient states that she called police because she was feeling suicidal and walked up to traffic today. She states that she does not want to kill herself but she has been thinking crazy since yesterday. She denies past suicide attempts. She states that she has been feeling overwhelmed because she is homeless, and addicted to drugs. She reports smoking crack today around 11 AM. She states that she has been smoking crack on and off for the past 6 months and has been using crack since she was 48 years old. She states that she smokes crack whenever she can get it. She reports snorting heroin today and states that  it was a one-time thing. However, she later states that in the past she's used heroin but is never been a habit. In addition, she states that she took a Suboxone  yesterday that she got from a friend. She denies methamphetamine use, benzodiazepine use or alcohol use. She reports feeling severely depressed and endorses depressive symptoms of anhedonia, hopelessness, worthlessness, poor sleep, decreased energy, poor appetite, and suicidal thoughts.  Pt identifies no social supports, however she does have an boyfriend and a son with whom she has strained relationships. Pt stated she is on probation and missed an appointment recently. She then state no that's not why I came in here either. She also asked several time if counselor had anything to do with medicine and if suboxone  could be given to her, she also asked for gabapentin  during assessment and stated she knew of a facility in [place] that she was able to get suboxone  from in the past and she would like to go back there.  Patient is unable to contract for safety outside of the hospital.  Patient gives verbal consent for [facility] to contact her probation Public librarian in [place].    Treatment options were discussed and patient is in agreement with recommendation for Inpatient [facility] and SUD treatment.    Chief Complaint:  Chief Complaint  Patient presents with   Addiction Problem   Depression   Anxiety   Suicidal   Visit Diagnosis: Major Depressive Disorder,Recurrent Severe without psychotic features Poly Substance Use    CCA Screening, Triage and Referral (STR)  Patient Reported Information How did you hear about us ? Other (Comment) ([employer])  What Is the Reason for Your Visit/Call Today? SI , depression and SUD  How Long Has This Been Causing You  Problems? <Week (Stated she only experience SI today)  What Do You Feel Would Help You the Most Today? Alcohol or Drug Use Treatment; Treatment for Depression or other mood problem;  Housing Assistance; Architectural technologist; Social Support; Medication(s)   Have You Recently Had Any Thoughts About Hurting Yourself? Yes  Are You Planning to Commit Suicide/Harm Yourself At This time? No (denies at this time but stated she tried walking in traffic earlier)   [employer] ED from 04/27/2024 in [facility] Most recent reading at 04/27/2024  1:15 PM ED from 01/06/2024 in [facility] Emergency Department at [facility] Most recent reading at 01/06/2024  9:33 PM ED from 01/06/2024 in [facility] Most recent reading at 01/06/2024  9:05 PM  C-SSRS RISK CATEGORY High Risk No Risk No Risk    Have you Recently Had Thoughts About Hurting Someone [name]? No  Are You Planning to Harm Someone at This Time? No  Explanation: NA   Have You Used Any Alcohol or Drugs in the Past 24 Hours? Yes  How Long Ago Did You Use Drugs or Alcohol? today What Did You Use and How Much? Heroin and Crack today, suboxone  yesterday. unknown amounts   Do You Currently Have a Therapist/Psychiatrist? No  Name of Therapist/Psychiatrist:    Have You Been Recently Discharged From Any Office Practice or Programs? No  Explanation of Discharge From Practice/Program: n/a    CCA Screening Triage Referral Assessment Type of Contact: Face-to-Face  Telemedicine Service Delivery:   Is this Initial or Reassessment?   Date Telepsych consult ordered in [facility]:    Time Telepsych consult ordered in [facility]:    Location of Assessment: [facility] Assessment Services  Provider Location: [facility] Assessment Services   Collateral Involvement: None.   Does Patient Have a Automotive engineer Guardian? No  Legal Guardian Contact Information: Pt is her own guardian.  Copy of Legal Guardianship Form: -- (Pt is her own guardian.)  Legal Guardian Notified of Arrival: -- (Pt is her own guardian.)  Legal Guardian Notified of Pending Discharge: -- (Pt is her  own guardian.)  If Minor and Not Living with Parent(s), Who has Custody? Pt is an adult.  Is CPS involved or ever been involved? Never  Is APS involved or ever been involved? Never   Patient Determined To Be At Risk for Harm To Self or Others Based on Review of Patient Reported Information or Presenting Complaint? No  Method: No Plan  Availability of Means: No access or NA  Intent: Vague intent or NA  Notification Required: No need or identified person  Additional Information for Danger to Others Potential: -- (NA)  Additional Comments for Danger to Others Potential: NA  Are There Guns or Other Weapons in Your Home? No  Types of Guns/Weapons: Pt denies, access to weapons.  Are These Weapons Safely Secured?                            -- (NA)  Who Could Verify You Are Able To Have These Secured: NA  Do You Have any Outstanding Charges, Pending Court Dates, Parole/Probation? Pt reports, she has a court date on Jan 09, 2024 for Larceny but says the charge will be dismissed.  Contacted To Inform of Risk of Harm To Self or Others: Other: Comment (NA)    Does Patient Present under Involuntary Commitment? No    County of Residence: Other (  Comment) (Pt reports, she's homeless, has no residence.)   Patient Currently Receiving the Following Services: Not Receiving Services   Determination of Need: Urgent (48 hours)   Options For Referral: Medication Management; Intensive Outpatient Therapy; Outpatient Therapy; Group Home     CCA Biopsychosocial Patient Reported Schizophrenia/Schizoaffective Diagnosis in Past: No   Strengths: honest about substance use and other psychosocial issues.   Mental Health Symptoms Depression:  Difficulty Concentrating; Hopelessness; Irritability; Sleep (too much or little); Increase/decrease in appetite; Tearfulness; Weight gain/loss; Worthlessness (stated she has gained about 40lbs in the last 4 months)   Duration of Depressive  symptoms: Duration of Depressive Symptoms: Greater than two weeks   Mania:  None   Anxiety:   Difficulty concentrating; Restlessness; Sleep; Worrying   Psychosis:  None   Duration of Psychotic symptoms:    Trauma:  None   Obsessions:  None   Compulsions:  None   Inattention:  N/A   Hyperactivity/Impulsivity:  N/A   Oppositional/Defiant Behaviors:  N/A   Emotional Irregularity:  Potentially harmful impulsivity; Unstable self-image; Chronic feelings of emptiness   Other Mood/Personality Symptoms:  n/a    Mental Status Exam Appearance and self-care  Stature:  Average   Weight:  Overweight   Clothing:  Disheveled   Grooming:  Neglected   Cosmetic use:  None   Posture/gait:  Slumped   Motor activity:  Agitated   Sensorium  Attention:  Distractible   Concentration:  Variable   Orientation:  X5   Recall/memory:  Normal   Affect and Mood  Affect:  Anxious; Congruent; Tearful; Depressed   Mood:  Anxious; Depressed; Worthless; Pessimistic; Negative   Relating  Eye contact:  Avoided   Facial expression:  Depressed; Sad   Attitude toward examiner:  Cooperative; Manipulative (mentioned us  calling her probation officer due to missing an appointment and mentioned that she wanted help getting to a facility that will give her suboxone . she also was asking for pain meds and suboxone  during assessment)   Thought and Language  Speech flow: Soft; Pressured; Slurred   Thought content:  Appropriate to Mood and Circumstances   Preoccupation:  Guilt; Other (Comment) (worthlessness)   Hallucinations:  None   Organization:  Insurance underwriter of Knowledge:  Fair   Intelligence:  Average   Abstraction:  Normal   Judgement:  Impaired   Reality Testing:  Adequate   Insight:  Fair   Decision Making:  Impulsive   Social Functioning  Social Maturity:  Impulsive; Irresponsible   Social Judgement:  Normal   Stress  Stressors:   Grief/losses; Legal; Financial; Relationship; Housing   Coping Ability:  Overwhelmed; Deficient supports   Skill Deficits:  Activities of daily living; Decision making; Interpersonal; Responsibility; Self-care; Self-control   Supports:  Support needed     Religion: Religion/Spirituality Are You A Religious Person?: Yes What is Your Religious Affiliation?: Christian How Might This Affect Treatment?: Asked assessor to pray for her.  Leisure/Recreation: Leisure / Recreation Do You Have Hobbies?: No  Exercise/Diet: Exercise/Diet Do You Exercise?: Yes (walking due to no trasportation) What Type of Exercise Do You Do?: Run/Walk How Many Times a Week Do You Exercise?: Daily Have You Gained or Lost A Significant Amount of Weight in the Past Six Months?: Yes-Gained Number of Pounds Gained: 40 Do You Follow a Special Diet?: No Do You Have Any Trouble Sleeping?: Yes Explanation of Sleeping Difficulties: not able to get much sleep at all. due to circumstances and due to  symptoms   CCA Employment/Education Employment/Work Situation: Employment / Work Situation Employment Situation: Unemployed Patient's Job has Been Impacted by Current Illness: No Has Patient ever Been in Equities trader?: No  Education: Education Is Patient Currently Attending School?: No Last Grade Completed:  (na) Did You Attend College?:  (na) Did You Have An Individualized Education Program (IIEP):  (na) Did You Have Any Difficulty At School?:  (na) Patient's Education Has Been Impacted by Current Illness:  (na)   CCA Family/Childhood History Family and Relationship History: Family history Marital status: Single Does patient have children?: Yes How many children?: 1 (36 yo son) How is patient's relationship with their children?: strained  Childhood History:  Childhood History By whom was/is the patient raised?: Other (Comment) (na) Did patient suffer any verbal/emotional/physical/sexual abuse as a  child?: No Did patient suffer from severe childhood neglect?: No Has patient ever been sexually abused/assaulted/raped as an adolescent or adult?: No Was the patient ever a victim of a crime or a disaster?: No Witnessed domestic violence?: No Has patient been affected by domestic violence as an adult?: No       CCA Substance Use Alcohol/Drug Use: Alcohol / Drug Use Pain Medications: See MAR Prescriptions: See MAR Over the Counter: See MAR History of alcohol / drug use?: Yes Longest period of sobriety (when/how long): unknown Negative Consequences of Use: Financial, Personal relationships, Legal (on probation, spent money mother left to her from death benefit on substances, strained realtionships with family) Withdrawal Symptoms: Agitation, Aggressive/Assaultive, Irritability, Sweats Substance #1 Name of Substance 1: Heroin 1 - Age of First Use: unk 1 - Amount (size/oz): unk 1 - Frequency: unk 1 - Duration: unk 1 - Last Use / Amount: today 1 - Method of Aquiring: refused 1- Route of Use: refused Substance #2 Name of Substance 2: Crack cocaine 2 - Age of First Use: unk 2 - Amount (size/oz): unk 2 - Frequency: unk 2 - Duration: unk 2 - Last Use / Amount: today 2 - Method of Aquiring: unk 2 - Route of Substance Use: smokeing                     ASAM's:  Six Dimensions of Multidimensional Assessment  Dimension 1:  Acute Intoxication and/or Withdrawal Potential:   Dimension 1:  Description of individual's past and current experiences of substance use and withdrawal: currently experiencing agitation, restlessness, crying, and cravings  Dimension 2:  Biomedical Conditions and Complications:   Dimension 2:  Description of patient's biomedical conditions and  complications: High blood pressure and restless leg syndrome  Dimension 3:  Emotional, Behavioral, or Cognitive Conditions and Complications:  Dimension 3:  Description of emotional, behavioral, or cognitive  conditions and complications: depressed, worthless, hopeless, some SI with plan  Dimension 4:  Readiness to Change:  Dimension 4:  Description of Readiness to Change criteria: Stated she wanted to get help and has been in treatment in the past  Dimension 5:  Relapse, Continued use, or Continued Problem Potential:  Dimension 5:  Relapse, continued use, or continued problem potential critiera description: n significant sober time. many stressors with family, finance and homelessnes, easy access to substances  Dimension 6:  Recovery/Living Environment:  Dimension 6:  Recovery/Iiving environment criteria description: homeless  ASAM Severity Score: ASAM's Severity Rating Score: 13  ASAM Recommended Level of Treatment: ASAM Recommended Level of Treatment: Level II Intensive Outpatient Treatment   Substance use Disorder (SUD) Substance Use Disorder (SUD)  Checklist Symptoms of Substance Use: Continued  use despite having a persistent/recurrent physical/psychological problem caused/exacerbated by use, Continued use despite persistent or recurrent social, interpersonal problems, caused or exacerbated by use, Evidence of tolerance, Evidence of withdrawal (Comment), Presence of craving or strong urge to use  Recommendations for Services/Supports/Treatments: Recommendations for Services/Supports/Treatments Recommendations For Services/Supports/Treatments: IOP (Intensive Outpatient Program), Peer Support, Peer Support Services, Transitional Living, Individual Therapy, Medication Management, CD-IOP Intensive Chemical Dependency Program  Disposition Recommendation per psychiatric provider: We recommend inpatient psychiatric hospitalization when medically cleared. Patient is under voluntary admission status at this time; please IVC if attempts to leave hospital.   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   MDD (major depressive disorder), severe (HCC) 12/18/2018   HCAP (healthcare-associated pneumonia) 09/16/2018   LLL pneumonia 09/16/2018   Sepsis (HCC) 09/16/2018   Hyponatremia 09/16/2018   Hypokalemia 09/16/2018   Severe benzodiazepine use disorder (HCC) 07/10/2018   Bipolar II disorder (HCC)    Severe recurrent major depression without psychotic features (HCC) 06/05/2018   PTSD (post-traumatic stress disorder) 11/21/2017   Major depressive disorder, recurrent episode, moderate (HCC) 11/05/2017   Community acquired pneumonia of right lower lobe of lung 09/02/2017   Sepsis associated hypotension (HCC) 09/02/2017   AKI (acute kidney injury) (HCC) 09/02/2017   Nausea and vomiting 09/02/2017   Drug overdose 04/22/2017   Chest pain 10/09/2016   Pressure injury of skin 10/05/2016   Polysubstance dependence including opioid type drug with complication, episodic abuse (HCC) 07/21/2016   Chronic pain 07/03/2015   Diarrhea 05/01/2014   Primary hypertension 04/17/2014     Referrals to Alternative Service(s): Referred to Alternative Service(s):   Place:   Date:   Time:    Referred to Alternative Service(s):   Place:   Date:   Time:    Referred to Alternative Service(s):   Place:   Date:   Time:    Referred to Alternative Service(s):   Place:   Date:   Time:     Devaughn Molt

## 2024-04-27 NOTE — Group Note (Signed)
 Group Topic: Recovery Basics  Group Date: 04/27/2024 Start Time: 1930 End Time: 2000 Facilitators: Verdon Jacqualyn BRAVO, NT  Department: [facility]  Number of Participants: 7  Group Focus: coping skills Treatment Modality:  Individual Therapy Interventions utilized were group exercise Purpose: express feelings  Name: Judy Robles Date of Birth: Sep 24, 1975  MR: [id number]    Level of Participation: active Quality of Participation: cooperative Interactions with others: gave feedback Mood/Affect: appropriate Triggers (if applicable): n/a Cognition: coherent/clear Progress: Moderate Response: n/a Follow up needed 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   MDD (major depressive disorder), severe (HCC) 12/18/2018   HCAP (healthcare-associated pneumonia) 09/16/2018   LLL pneumonia 09/16/2018   Sepsis (HCC) 09/16/2018   Hyponatremia 09/16/2018   Hypokalemia 09/16/2018   Severe benzodiazepine use disorder (HCC) 07/10/2018   Bipolar II disorder (HCC)    Severe recurrent major depression without psychotic features (HCC) 06/05/2018   PTSD (post-traumatic stress disorder) 11/21/2017   Major depressive disorder, recurrent episode, moderate (HCC) 11/05/2017   Community acquired pneumonia of right lower lobe of lung 09/02/2017   Sepsis associated hypotension (HCC) 09/02/2017   AKI (acute kidney injury) (HCC) 09/02/2017   Nausea and vomiting 09/02/2017   Drug overdose 04/22/2017   Chest  pain 10/09/2016   Pressure injury of skin 10/05/2016   Polysubstance dependence including opioid type drug with complication, episodic abuse (HCC) 07/21/2016   Chronic pain 07/03/2015   Diarrhea 05/01/2014   Primary hypertension 04/17/2014

## 2024-04-27 NOTE — ED Provider Notes (Signed)
 Behavioral Health Urgent Care Medical Screening Exam  Patient Name: Judy Robles MRN: [id number] Date of Evaluation: 04/27/24 Chief Complaint:  Per Triage: PT Judy Robles 48Y female presents to [facility], voluntarily by [employer]. PT stated she called [employer] because she is suicidal, depressed, dealing with anxiety and is an addict. PT endorses SI, walked out in traffic today. PT stated she thought her boyfriend was going to kill her. PT is crying and stated she is having body cramps everywhere. PT states please don't turn me away. PT endorses SI, HI and AH. PT admits to using crack and heroin within the past 24hrs (unknown amount.)  Diagnosis:  Final diagnoses:  MDD (major depressive disorder), severe (HCC)  Polysubstance abuse (HCC)  Cocaine abuse (HCC)  Opioid use disorder    History of Present illness: Judy Robles is a 48 y.o. female patient with a documented psychiatric history significant for polysubstance abuse (opioid use, cocaine use, methamphetamine use, benzodiazepine, alcohol use), MDD, bipolar 2, and PTSD and a medical history of drug overdose, hypertension and restless leg syndrome who presented to the [facility] voluntary accompanied by law enforcement with complaints of suicidal thoughts with a plan, worsening depression and, anxiety and drug abuse.  Patient states that she called police because she was feeling suicidal and walked up to traffic today. She states that she does not want to kill herself but she has been thinking crazy since yesterday. She denies past suicide attempts. She states that she has been feeling overwhelmed because she is homeless, and addicted to drugs. She reports smoking crack today around 11 AM. She states that she has been smoking crack on and off for the past 6 months and has been using crack since she was 48 years old. She states that she smokes crack whenever she can get it. She reports snorting heroin today and states that it  was a one-time thing. However, she later states that in the past she's used heroin but is never been a habit. In addition, she states that she took a Suboxone  yesterday that she got from a friend. She denies methamphetamine use, benzodiazepine use or alcohol use. She reports feeling severely depressed and endorses depressive symptoms of anhedonia, hopelessness, worthlessness, poor sleep, decreased energy, poor appetite, and suicidal thoughts. PHQ-9 score 24 which indicates severe depression. She reports experiencing hallucinations and states that she think about stuff and it comes true and that she feels paranoid like people are watching her. Objectively, no signs of acute psychosis.   Patient states that she is homeless but sometimes she stays with her child's father or she stays with a friend. She reports an upcoming court date for larceny on 05/16/24 and states that she is on probation in [place]. She denies outpatient psychiatry or counseling. She reports a medical history of hypertension and possible lupus. She denies taking prescribed medications.    Flowsheet Row ED from 04/27/2024 in [facility] Most recent reading at 04/27/2024  1:15 PM ED from 01/06/2024 in [facility] at [facility] Most recent reading at 01/06/2024  9:33 PM ED from 01/06/2024 in [facility] Most recent reading at 01/06/2024  9:05 PM  C-SSRS RISK CATEGORY High Risk No Risk No Risk    Psychiatric Specialty Exam  Presentation  General Appearance:Disheveled  Eye Contact:Fair  Speech:Clear and Coherent  Speech Volume:Normal  Handedness:Right   Mood and Affect  Mood: Depressed; Anxious  Affect: Congruent  Thought Process  Thought Processes: Coherent  Descriptions of Associations:Intact  Orientation:Full (Time, Place and Person)  Thought Content:Logical  Diagnosis of Schizophrenia or Schizoaffective disorder in  past: No   Hallucinations:None  Ideas of Reference:None  Suicidal Thoughts:Yes, Active  Homicidal Thoughts:No   Sensorium  Memory: Immediate Fair; Recent Fair; Remote Fair  Judgment: Poor  Insight: Poor   Executive Functions  Concentration: Poor  Attention Span: Poor  Recall: [employer] of Knowledge: Fair  Language: Fair   Psychomotor Activity  Psychomotor Activity: Restlessness   Assets  Assets: Manufacturing systems engineer; Desire for Improvement   Sleep  Sleep: Poor   Physical Exam: Physical Exam Cardiovascular:     Rate and Rhythm: Normal rate.  Pulmonary:     Effort: Pulmonary effort is normal.  Musculoskeletal:        General: Normal range of motion.     Cervical back: Normal range of motion.  Neurological:     Mental Status: She is alert and oriented to person, place, and time.    Review of Systems  Constitutional: Negative.   HENT: Negative.    Eyes: Negative.   Respiratory: Negative.    Cardiovascular: Negative.   Gastrointestinal: Negative.   Genitourinary: Negative.   Musculoskeletal: Negative.   Neurological: Negative.   Psychiatric/Behavioral:  Positive for depression, hallucinations, substance abuse and suicidal ideas. The patient is nervous/anxious.    Blood pressure (!) 151/88, pulse 85, resp. rate 18, SpO2 100%. There is no height or weight on file to calculate BMI.  Musculoskeletal: Strength & Muscle Tone: within normal limits Gait & Station: normal Patient leans: N/A   [facility] MSE Discharge Disposition for Follow up and Recommendations: Based on my evaluation I certify that psychiatric inpatient services furnished can reasonably be expected to improve the patient's condition which I recommend transfer to an appropriate accepting facility.   Patient accepted to the [facility] for mood stabilization and substance abuse treatment. Patient is voluntary.  Lab Orders         CBC with  Differential/Platelet         Comprehensive metabolic panel         Hemoglobin A1c         Ethanol         Lipid panel         TSH         RPR         VITAMIN D  25 Hydroxy (Vit-D Deficiency, Fractures)         Vitamin B12         Magnesium          HIV Antibody (routine testing w rflx)         POC urine preg, ED         POCT Urine Drug Screen - (I-Screen)    EKG  Torez Beauregard L, NP 04/27/2024, 1:50 PM

## 2024-04-27 NOTE — Discharge Instructions (Signed)
 Transfer to [facility]

## 2024-04-27 NOTE — ED Notes (Signed)
Patient transferred to [facility] ?

## 2024-04-28 DIAGNOSIS — F1920 Other psychoactive substance dependence, uncomplicated: Secondary | ICD-10-CM | POA: Diagnosis not present

## 2024-04-28 LAB — RPR: RPR Ser Ql: NONREACTIVE

## 2024-04-28 MED ORDER — VITAMIN D (ERGOCALCIFEROL) 1.25 MG (50000 UNIT) PO CAPS
50000.0000 [IU] | ORAL_CAPSULE | ORAL | Status: DC
  Administered 2024-04-28: 50000 [IU] via ORAL
  Filled 2024-04-28: qty 1

## 2024-04-28 MED ORDER — NICOTINE 14 MG/24HR TD PT24
14.0000 mg | MEDICATED_PATCH | Freq: Every day | TRANSDERMAL | Status: DC
  Administered 2024-04-28 – 2024-05-01 (×4): 14 mg via TRANSDERMAL
  Filled 2024-04-28 (×5): qty 1

## 2024-04-28 MED ORDER — NICOTINE POLACRILEX 2 MG MT GUM: 2.0000 mg | CHEWING_GUM | OROMUCOSAL | Status: DC | PRN

## 2024-04-28 MED ORDER — MAGNESIUM OXIDE -MG SUPPLEMENT 400 (240 MG) MG PO TABS
400.0000 mg | ORAL_TABLET | Freq: Every day | ORAL | Status: DC
  Administered 2024-04-28 – 2024-05-02 (×5): 400 mg via ORAL
  Filled 2024-04-28 (×5): qty 1

## 2024-04-28 MED ORDER — GABAPENTIN 100 MG PO CAPS
100.0000 mg | ORAL_CAPSULE | Freq: Three times a day (TID) | ORAL | Status: DC
  Administered 2024-04-28 – 2024-04-29 (×4): 100 mg via ORAL
  Filled 2024-04-28 (×4): qty 1

## 2024-04-28 MED ORDER — VENLAFAXINE HCL ER 37.5 MG PO CP24
37.5000 mg | ORAL_CAPSULE | Freq: Once | ORAL | Status: AC
  Administered 2024-04-28: 37.5 mg via ORAL
  Filled 2024-04-28: qty 1

## 2024-04-28 NOTE — Group Note (Signed)
 Group Topic: Balance in Life  Group Date: 04/28/2024 Start Time: 2000 End Time: 2100 Facilitators: Luvenia Mae SAUNDERS, NT  Department: [facility]  Number of Participants: 6  Group Focus: relapse prevention Treatment Modality:  Leisure Development Interventions utilized were story telling Purpose: express feelings  Name: Judy Robles Date of Birth: 1975-11-05  MR: [id number]    Level of Participation: minimal Quality of Participation: attentive Interactions with others: Pt. 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   HCAP (healthcare-associated pneumonia) 09/16/2018   LLL pneumonia 09/16/2018   Sepsis (HCC) 09/16/2018   Hyponatremia 09/16/2018   Hypokalemia 09/16/2018   Severe benzodiazepine use disorder (HCC) 07/10/2018   Bipolar II disorder (HCC)    PTSD (post-traumatic stress disorder) 11/21/2017   Community acquired pneumonia of right lower lobe of lung 09/02/2017   Sepsis associated hypotension (HCC) 09/02/2017   AKI (acute kidney injury) (HCC) 09/02/2017   Nausea and vomiting 09/02/2017   Drug overdose 04/22/2017   Chest pain 10/09/2016   Pressure injury of skin 10/05/2016   Polysubstance dependence including opioid type drug with complication, continuous use (HCC) 07/21/2016   Chronic pain 07/03/2015   Diarrhea 05/01/2014   Primary hypertension 04/17/2014

## 2024-04-28 NOTE — ED Notes (Signed)
 Pt reports to feel 'alright', presents as somewhat depressed, but cooperative. Pt denies si hi and avh- verbal contract for safety provided. Pt c/o 10/10 bilateral leg pain, says its caused by excessive walking. Pt reports hoarse throat and mild soreness due to having excessive mucus last night. Pt c/o anxiety. Pt administered prn atarax , robaxin , and tylenol  during AM medication pass. Medications reviewed, questions denied. Pt ate breakfast.

## 2024-04-28 NOTE — ED Notes (Signed)
 Pt administered afternoon scheduled medication, questions denied, pt declined medication educational literature. Pt currently in dayroom watching TV.

## 2024-04-28 NOTE — ED Notes (Signed)
 Patient COWS assessment could not be done as pt is asleep at this time

## 2024-04-28 NOTE — ED Provider Notes (Signed)
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   Final   Comment 12/14/2023 Normal Reference  [place] Chlamydia - Negative   Final   Comment 12/14/2023 Normal Reference Range Neisseria Gonorrhea - Negative   Final   Preg Test, Ur 12/14/2023 Negative  Negative Final   POC Amphetamine  UR 12/14/2023 None Detected  NONE DETECTED (Cut Off Level 1000 ng/mL) Final   POC Secobarbital (BAR) 12/14/2023 None Detected  NONE DETECTED (Cut Off Level 300 ng/mL) Final   POC Buprenorphine  (BUP) 12/14/2023 None Detected  NONE DETECTED (Cut Off Level 10 ng/mL) Final   POC Oxazepam (BZO) 12/14/2023 None Detected  NONE DETECTED (Cut Off Level 300 ng/mL) Final   POC Cocaine UR 12/14/2023 Positive (A)  NONE DETECTED (Cut Off Level 300 ng/mL) Final   POC Methamphetamine UR 12/14/2023 None Detected  NONE DETECTED (Cut Off Level 1000 ng/mL) Final   POC Morphine  12/14/2023 None Detected  NONE DETECTED (Cut Off Level 300 ng/mL) Final   POC Methadone UR 12/14/2023 None Detected  NONE DETECTED (Cut Off Level 300 ng/mL) Final   POC Oxycodone  UR 12/14/2023 Positive (A)  NONE DETECTED (Cut Off Level 100 ng/mL) Final   POC Marijuana UR 12/14/2023 Positive (A)  NONE DETECTED (Cut Off Level 50 ng/mL) Final  Admission on 11/20/2023, Discharged on 11/20/2023  Component Date Value Ref Range Status   Sodium 11/20/2023 138  135 - 145 mmol/L Final   Potassium 11/20/2023 3.5  3.5 - 5.1 mmol/L Final   Chloride 11/20/2023 102  98 - 111 mmol/L Final   CO2 11/20/2023 25  22 - 32 mmol/L Final   Glucose, Bld 11/20/2023 89  70 - 99 mg/dL Final   Glucose reference range applies only to samples taken after fasting for at least 8 hours.   BUN 11/20/2023 9  6 - 20 mg/dL Final   Creatinine, Ser 11/20/2023 0.87  0.44 - 1.00 mg/dL Final   Calcium  11/20/2023 9.5  8.9 - 10.3  mg/dL Final   Total Protein 96/68/7974 7.7  6.5 - 8.1 g/dL Final   Albumin 96/68/7974 3.5  3.5 - 5.0 g/dL Final   AST 96/68/7974 81 (H)  15 - 41 U/L Final   ALT 11/20/2023 60 (H)  0 - 44 U/L Final   Alkaline Phosphatase 11/20/2023 62  38 - 126 U/L Final   Total  Bilirubin 11/20/2023 0.5  0.0 - 1.2 mg/dL Final   GFR, Estimated 11/20/2023 >60  >60 mL/min Final   Comment: (NOTE) Calculated using the CKD-EPI Creatinine Equation (2021)    Anion gap 11/20/2023 11  5 - 15 Final   Performed at [facility], [address], [place], [place] [address]   WBC 11/20/2023 6.2  4.0 - 10.5 K/uL Final   RBC 11/20/2023 4.02  3.87 - 5.11 MIL/uL Final   Hemoglobin 11/20/2023 12.8  12.0 - 15.0 g/dL Final   HCT 96/68/7974 39.0  36.0 - 46.0 % Final   MCV 11/20/2023 97.0  80.0 - 100.0 fL Final   MCH 11/20/2023 31.8  26.0 - 34.0 pg Final   MCHC 11/20/2023 32.8  30.0 - 36.0 g/dL Final   RDW 96/68/7974 14.4  11.5 - 15.5 % Final   Platelets 11/20/2023 164  150 - 400 K/uL Final   nRBC 11/20/2023 0.0  0.0 - 0.2 % Final   Neutrophils Relative % 11/20/2023 53  % Final   Neutro Abs 11/20/2023 3.3  1.7 - 7.7 K/uL Final   Lymphocytes Relative 11/20/2023 33  % Final   Lymphs Abs 11/20/2023 2.1  0.7 - 4.0 K/uL Final   Monocytes Relative 11/20/2023 8  % Final   Monocytes Absolute 11/20/2023 0.5  0.1 - 1.0 K/uL Final   Eosinophils Relative 11/20/2023 5  % Final   Eosinophils Absolute 11/20/2023 0.3  0.0 - 0.5 K/uL Final   Basophils Relative 11/20/2023 1  % Final   Basophils Absolute 11/20/2023 0.1  0.0 - 0.1 K/uL Final   Immature Granulocytes 11/20/2023 0  % Final   Abs Immature Granulocytes 11/20/2023 0.02  0.00 - 0.07 K/uL Final   Performed at [facility], [address], [place], [place] [address]   Alcohol, Ethyl (B) 11/20/2023 144 (H)  <10 mg/dL Final   Comment: (NOTE) Lowest detectable limit for serum alcohol is 10 mg/dL.  For medical purposes only. Performed at [facility], [address] [place], [place] [address]    Acetaminophen  (Tylenol ), Serum 11/20/2023 <10 (L)  10 - 30 ug/mL Final   Comment: (NOTE) Therapeutic concentrations vary significantly. A range of 10-30 ug/mL  may be an effective concentration for many patients. However, some   are best treated at concentrations outside of this range. Acetaminophen  concentrations >150 ug/mL at 4 hours after ingestion  and >50 ug/mL at 12 hours after ingestion are often associated with  toxic reactions.  Performed at [facility], [address] [place], [place] [address]    Specimen Source 11/20/2023 URINE, CLEAN CATCH   Final   Color, Urine 11/20/2023 YELLOW  YELLOW Final   APPearance 11/20/2023 HAZY (A)  CLEAR Final   Specific Gravity, Urine 11/20/2023 1.008  1.005 - 1.030 Final   pH 11/20/2023 6.0  5.0 - 8.0 Final   Glucose, UA 11/20/2023 NEGATIVE  NEGATIVE mg/dL Final   Hgb urine dipstick 11/20/2023 SMALL (A)  NEGATIVE Final   Bilirubin Urine 11/20/2023 NEGATIVE  NEGATIVE Final   Ketones, ur 11/20/2023 NEGATIVE  NEGATIVE mg/dL Final  Protein, ur 11/20/2023 NEGATIVE  NEGATIVE mg/dL Final   Nitrite 96/68/7974 NEGATIVE  NEGATIVE Final   Leukocytes,Ua 11/20/2023 NEGATIVE  NEGATIVE Final   RBC / HPF 11/20/2023 0-5  0 - 5 RBC/hpf Final   WBC, UA 11/20/2023 0-5  0 - 5 WBC/hpf Final   Comment:        Reflex urine culture not performed if WBC <=10, OR if Squamous epithelial cells >5. If Squamous epithelial cells >5 suggest recollection.    Bacteria, UA 11/20/2023 RARE (A)  NONE SEEN Final   Squamous Epithelial / HPF 11/20/2023 0-5  0 - 5 /HPF Final   Mucus 11/20/2023 PRESENT   Final   Performed at [facility], [address] [place], [place] [address]   Preg, Serum 11/20/2023 NEGATIVE  NEGATIVE Final   Comment:        THE SENSITIVITY OF THIS METHODOLOGY IS >10 mIU/mL. Performed at [facility], [address] [place], [place] [address]     Allergies: Gramineae pollens, Lisinopril , and Mirtazapine   Medications:  Facility Ordered Medications  Medication   acetaminophen  (TYLENOL ) tablet 650 mg   alum & mag hydroxide-simeth (MAALOX/MYLANTA) 200-200-20 MG/5ML suspension 30 mL   magnesium  hydroxide (MILK OF MAGNESIA) suspension 30 mL    haloperidol  (HALDOL ) tablet 5 mg   And   diphenhydrAMINE  (BENADRYL ) capsule 50 mg   haloperidol  lactate (HALDOL ) injection 5 mg   And   diphenhydrAMINE  (BENADRYL ) injection 50 mg   And   LORazepam  (ATIVAN ) injection 2 mg   haloperidol  lactate (HALDOL ) injection 10 mg   And   diphenhydrAMINE  (BENADRYL ) injection 50 mg   And   LORazepam  (ATIVAN ) injection 2 mg   hydrOXYzine  (ATARAX ) tablet 25 mg   traZODone  (DESYREL ) tablet 50 mg   dicyclomine  (BENTYL ) tablet 20 mg   loperamide  (IMODIUM ) capsule 2-4 mg   methocarbamol  (ROBAXIN ) tablet 500 mg   naproxen  (NAPROSYN ) tablet 500 mg   ondansetron  (ZOFRAN -ODT) disintegrating tablet 4 mg   cloNIDine  (CATAPRES ) tablet 0.1 mg   Followed by   NOREEN ON 04/30/2024] cloNIDine  (CATAPRES ) tablet 0.1 mg   Followed by   NOREEN ON 05/02/2024] cloNIDine  (CATAPRES ) tablet 0.1 mg     Medical Decision Making  Patient currently seeking help, but there is concern for minimizing substance use and seeking to be put on adderall, which would not be appropriate for the patient at this time. Will detox and encourage insight.     Recommendations  Long Term Goals: Improvement in symptoms so as ready for discharge   Short Term Goals: Patient will verbalize feelings in meetings with treatment team members., Patient will attend at least of 50% of the groups daily., Pt will complete the PHQ9 on admission, day 3 and discharge., and Patient will take medications as prescribed daily.  Medications: Mood/anxiety: continue group therapy, milieu therapy, 1:1 evaluation with provider.  Medication management: effexor  XR 37.5mg  PO daily for mood and anxiety  Gabapentin  100mg  PO TID for addiction, chronic pain Substance Abuse:  brief intervention provided abstinence advised.  opioids: COWS monitoring with coverage of withdrawal symptoms.  stimulant use: encourage patient to maintain PO intake. Coverage PRN for secondary psychosis.  Alcohol use disorder: Replacing Thiamine .  CIWA monitoring with benzodiazepine coverage per withdrawal scoring. Monitoring HR and BP.  Nicotine  and tobacco use: Nicotine  replacement therapy provided.  counseling on cessation of nicotine  use provided Medical: PRNs for pain, constipation, indigestion available.  Labs/studies: labs reviewed. Sodium 133, mag 1.6 and started replacement, vitamin D   was low so replacing that  UDS + buprenorphine , methamphetamine, oxycodone  TSH was 5.052, checking T3 and T4 Safety and Monitoring: voluntarily admission to [facility]/Facility based care unit [facility]) unit for safety, stabilization and treatment Daily contact with patient to assess and evaluate symptoms and progress in treatment Patient's case to be discussed in multi-disciplinary team meeting Observation Level : q15 minute checks Vital signs: q12 hours Precautions: Suicide and withdrawal  Based on my evaluation the patient does not appear to have an emergency medical condition.   Corean Anette Potters, MD 04/28/24  2:19 PM

## 2024-04-28 NOTE — ED Notes (Signed)
 Patient is in the bedroom sleeping NAD. Will continue to monitor for safety.

## 2024-04-28 NOTE — Group Note (Signed)
 Group Topic: Wellness  Group Date: 04/28/2024 Start Time: 0900 End Time: 1000 Facilitators: Daved Tinnie HERO, RN  Department: [facility]  Number of Participants: 8  Group Focus: nursing group Treatment Modality:  Psychoeducation Interventions utilized were patient education Purpose: relapse prevention strategies  Name: Judy Robles Date of Birth: 30-Nov-1975  MR: [id number]    Level of Participation: active Quality of Participation: cooperative Interactions with others: gave feedback Mood/Affect: appropriate Triggers (if applicable): n/a Cognition: coherent/clear Progress: Gaining insight Response: RN reviewed and discussed medications with pt, questions denied.  Plan: patient will be encouraged to attend future RN education groups.   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   HCAP (healthcare-associated pneumonia) 09/16/2018   LLL pneumonia 09/16/2018   Sepsis (HCC) 09/16/2018   Hyponatremia 09/16/2018   Hypokalemia 09/16/2018   Severe benzodiazepine use disorder (HCC) 07/10/2018   Bipolar II disorder (HCC)    PTSD (post-traumatic stress disorder) 11/21/2017   Community acquired pneumonia of right lower lobe of lung 09/02/2017   Sepsis associated hypotension (HCC) 09/02/2017   AKI (acute kidney injury) (HCC) 09/02/2017   Nausea and vomiting 09/02/2017   Drug overdose 04/22/2017   Chest pain 10/09/2016   Pressure injury of skin 10/05/2016   Polysubstance dependence including opioid type drug with complication, continuous use (HCC) 07/21/2016   Chronic pain 07/03/2015   Diarrhea 05/01/2014    Primary hypertension 04/17/2014

## 2024-04-28 NOTE — ED Notes (Signed)
 Patient is complaining she is not getting enough juice, the MHT gave her extra juice at breakfast, then at lunch she drank most of the juice she wanted, then said it was watered down? Explained to her there is nothing wrong with the juice, patient wanted more juice, again the MHT explained she cannot give her all the juice she wants, advised patient to drink water , she did not want that, the MHT gave the patient the extra juice, telling her there will no more extra after this. The patient agreed.

## 2024-04-28 NOTE — Group Note (Signed)
 Group Topic: Healthy Self Image and Positive Change  Group Date: 04/28/2024 Start Time: 0800 End Time: 0850 Facilitators: Lonzell Dwayne RAMAN, NT  Department: [facility]  Number of Participants: 3  Group Focus: anxiety Treatment Modality:  Psychoeducation Interventions utilized were patient education Purpose: reinforce self-care  Name: Judy Robles Date of Birth: 15-Oct-1975  MR: [id number]    Level of Participation: Patient did attend groupminimal Quality of Participation: isolative Interactions with others: Did not interact  Mood/Affect: flat Triggers (if applicable): N/A Cognition: processing slowly Progress: Minimal Response: She is trying to do better  Plan: follow-up needed  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   HCAP (healthcare-associated pneumonia) 09/16/2018   LLL pneumonia 09/16/2018   Sepsis (HCC) 09/16/2018   Hyponatremia 09/16/2018   Hypokalemia 09/16/2018   Severe benzodiazepine use disorder (HCC) 07/10/2018   Bipolar II disorder (HCC)    PTSD (post-traumatic stress disorder) 11/21/2017   Community acquired pneumonia of right lower lobe of lung 09/02/2017   Sepsis associated hypotension (HCC) 09/02/2017   AKI (acute kidney injury) (HCC) 09/02/2017   Nausea and vomiting 09/02/2017   Drug overdose 04/22/2017   Chest pain 10/09/2016   Pressure injury of skin 10/05/2016   Polysubstance dependence including opioid type drug with complication, continuous use (HCC) 07/21/2016   Chronic pain 07/03/2015   Diarrhea 05/01/2014   Primary hypertension 04/17/2014

## 2024-04-28 NOTE — ED Notes (Signed)
 Pt ate lunch, currently resting in bed. Pt reports current pain level 8/10, reports that atarax  was effective in relieving anxiety. Pt generally isolative, but polite.

## 2024-04-29 DIAGNOSIS — F1920 Other psychoactive substance dependence, uncomplicated: Secondary | ICD-10-CM | POA: Diagnosis not present

## 2024-04-29 DIAGNOSIS — F332 Major depressive disorder, recurrent severe without psychotic features: Secondary | ICD-10-CM | POA: Diagnosis not present

## 2024-04-29 DIAGNOSIS — F1994 Other psychoactive substance use, unspecified with psychoactive substance-induced mood disorder: Secondary | ICD-10-CM

## 2024-04-29 MED ORDER — GABAPENTIN 300 MG PO CAPS
300.0000 mg | ORAL_CAPSULE | Freq: Three times a day (TID) | ORAL | Status: DC
  Administered 2024-04-29 – 2024-04-30 (×3): 300 mg via ORAL
  Filled 2024-04-29 (×3): qty 1

## 2024-04-29 NOTE — Care Management (Signed)
 [facility] Care Management   Writer referred patient to [facility], [facility] and [facility].

## 2024-04-29 NOTE — Group Note (Signed)
 Group Topic: Relapse and Recovery  Group Date: 04/29/2024 Start Time: 1000 End Time: 1100 Facilitators: Alyse Leilani LABOR, NT  Department: [facility]  Number of Participants: 6  Group Focus: chemical dependency education and chemical dependency issues Treatment Modality:  Skills Training Interventions utilized were patient education Purpose: relapse prevention strategies  Name: Judy Robles Date of Birth: 02/25/1976  MR: [id number]    Pt did not attend group, she stated that she can not stay awake  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   HCAP (healthcare-associated pneumonia) 09/16/2018   LLL pneumonia 09/16/2018   Sepsis (HCC) 09/16/2018   Hyponatremia 09/16/2018   Hypokalemia 09/16/2018   Severe benzodiazepine use disorder (HCC) 07/10/2018   Bipolar II disorder (HCC)    PTSD (post-traumatic stress disorder) 11/21/2017   Community acquired pneumonia of right lower lobe of lung 09/02/2017   Sepsis associated hypotension (HCC) 09/02/2017   AKI (acute kidney injury) (HCC) 09/02/2017   Nausea and vomiting 09/02/2017   Drug overdose 04/22/2017   Chest pain 10/09/2016   Pressure injury of skin 10/05/2016   Polysubstance dependence including opioid type drug with complication, continuous use (HCC) 07/21/2016   Chronic pain 07/03/2015   Diarrhea 05/01/2014   Primary hypertension 04/17/2014

## 2024-04-29 NOTE — ED Notes (Signed)
 Pt observed and assessed in room. Pt was sleeping when Clinical research associate came in. Pt endorsed depression stating, the stressors are just life and every happening. Pt denies SI/HI/AVH. Q15  safety checks in place.

## 2024-04-29 NOTE — ED Notes (Signed)
 Pt awake and alert. This morning asked to call her probation officer and had nursing staff confirm her presence at [employer].  Flat affect  with good eye contact. Pt is assertive with multiple needs.    She took medication as prescribed.   Currently eating lunch without incident.

## 2024-04-29 NOTE — ED Provider Notes (Signed)
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 12/15/2023 133  0 - 200 mg/dL Final   Triglycerides 95/74/7974 34  <150 mg/dL Final   HDL 95/74/7974 57  >40 mg/dL Final   Total CHOL/HDL Ratio 12/15/2023 2.3  RATIO Final   VLDL 12/15/2023 7  0 - 40 mg/dL Final   LDL Cholesterol 12/15/2023 69  0 - 99 mg/dL Final   Comment:        Total Cholesterol/HDL:CHD Risk Coronary Heart Disease Risk Table                     Men   Women   1/2 Average Risk   3.4   3.3  Average Risk       5.0   4.4  2 X Average Risk   9.6   7.1  3 X Average Risk  23.4   11.0        Use the calculated Patient Ratio above and the CHD Risk Table to determine the patient's CHD Risk.        ATP III CLASSIFICATION (LDL):  <100     mg/dL   Optimal  899-870  mg/dL   Near or Above                    Optimal  130-159  mg/dL   Borderline  839-810  mg/dL   High  >809     mg/dL   Very High Performed at [facility], [address], [place], [place] [address]    TSH 12/15/2023 2.653  0.350 - 4.500 uIU/mL Final   Comment: Performed by a 3rd Generation assay with a functional sensitivity of <=0.01 uIU/mL. Performed at [facility], [address], [place], [place] [address]   Admission on 12/14/2023, Discharged on 12/14/2023  Component Date Value Ref Range Status   Neisseria Gonorrhea 12/14/2023 Negative   Final   Chlamydia 12/14/2023 Negative   Final   Comment 12/14/2023 Normal Reference Ranger Chlamydia - Negative   Final   Comment 12/14/2023 Normal Reference Range Neisseria Gonorrhea - Negative   Final   Preg Test, Ur 12/14/2023 Negative  Negative Final   POC Amphetamine  UR 12/14/2023 None Detected  NONE DETECTED (Cut Off Level 1000 ng/mL) Final   POC Secobarbital (BAR) 12/14/2023 None Detected  NONE DETECTED (  Cut Off Level 300 ng/mL) Final   POC Buprenorphine  (BUP) 12/14/2023 None Detected  NONE DETECTED (Cut Off Level 10 ng/mL) Final   POC Oxazepam (BZO) 12/14/2023 None Detected  NONE DETECTED (Cut Off Level 300 ng/mL) Final   POC Cocaine UR 12/14/2023 Positive (A)  NONE DETECTED (Cut Off Level 300 ng/mL) Final   POC Methamphetamine UR 12/14/2023 None Detected  NONE DETECTED (Cut Off Level 1000 ng/mL) Final   POC Morphine  12/14/2023 None Detected  NONE DETECTED (Cut Off Level 300 ng/mL) Final   POC Methadone UR 12/14/2023 None Detected  NONE DETECTED (Cut Off Level 300 ng/mL) Final   POC Oxycodone  UR 12/14/2023 Positive (A)  NONE  DETECTED (Cut Off Level 100 ng/mL) Final   POC Marijuana UR 12/14/2023 Positive (A)  NONE DETECTED (Cut Off Level 50 ng/mL) Final  Admission on 11/20/2023, Discharged on 11/20/2023  Component Date Value Ref Range Status   Sodium 11/20/2023 138  135 - 145 mmol/L Final   Potassium 11/20/2023 3.5  3.5 - 5.1 mmol/L Final   Chloride 11/20/2023 102  98 - 111 mmol/L Final   CO2 11/20/2023 25  22 - 32 mmol/L Final   Glucose, Bld 11/20/2023 89  70 - 99 mg/dL Final   Glucose reference range applies only to samples taken after fasting for at least 8 hours.   BUN 11/20/2023 9  6 - 20 mg/dL Final   Creatinine, Ser 11/20/2023 0.87  0.44 - 1.00 mg/dL Final   Calcium  11/20/2023 9.5  8.9 - 10.3 mg/dL Final   Total Protein 96/68/7974 7.7  6.5 - 8.1 g/dL Final   Albumin 96/68/7974 3.5  3.5 - 5.0 g/dL Final   AST 96/68/7974 81 (H)  15 - 41 U/L Final   ALT 11/20/2023 60 (H)  0 - 44 U/L Final   Alkaline Phosphatase 11/20/2023 62  38 - 126 U/L Final   Total Bilirubin 11/20/2023 0.5  0.0 - 1.2 mg/dL Final   GFR, Estimated 11/20/2023 >60  >60 mL/min Final   Comment: (NOTE) Calculated using the CKD-EPI Creatinine Equation (2021)    Anion gap 11/20/2023 11  5 - 15 Final   Performed at [facility], [address], [place], [place] [address]   WBC 11/20/2023 6.2  4.0 - 10.5 K/uL Final   RBC 11/20/2023 4.02  3.87 - 5.11 MIL/uL Final   Hemoglobin 11/20/2023 12.8  12.0 - 15.0 g/dL Final   HCT 96/68/7974 39.0  36.0 - 46.0 % Final   MCV 11/20/2023 97.0  80.0 - 100.0 fL Final   MCH 11/20/2023 31.8  26.0 - 34.0 pg Final   MCHC 11/20/2023 32.8  30.0 - 36.0 g/dL Final   RDW 96/68/7974 14.4  11.5 - 15.5 % Final   Platelets 11/20/2023 164  150 - 400 K/uL Final   nRBC 11/20/2023 0.0  0.0 - 0.2 % Final   Neutrophils Relative % 11/20/2023 53  % Final   Neutro Abs 11/20/2023 3.3  1.7 - 7.7 K/uL Final   Lymphocytes Relative 11/20/2023 33  % Final   Lymphs Abs 11/20/2023 2.1  0.7 - 4.0 K/uL Final   Monocytes  Relative 11/20/2023 8  % Final   Monocytes Absolute 11/20/2023 0.5  0.1 - 1.0 K/uL Final   Eosinophils Relative 11/20/2023 5  % Final   Eosinophils Absolute 11/20/2023 0.3  0.0 - 0.5 K/uL Final   Basophils Relative 11/20/2023 1  % Final   Basophils Absolute 11/20/2023 0.1  0.0 - 0.1 K/uL Final  Immature Granulocytes 11/20/2023 0  % Final   Abs Immature Granulocytes 11/20/2023 0.02  0.00 - 0.07 K/uL Final   Performed at [facility], [address] [place], [place] [address]   Alcohol, Ethyl (B) 11/20/2023 144 (H)  <10 mg/dL Final   Comment: (NOTE) Lowest detectable limit for serum alcohol is 10 mg/dL.  For medical purposes only. Performed at [facility], [address], [place], [place] [address]    Acetaminophen  (Tylenol ), Serum 11/20/2023 <10 (L)  10 - 30 ug/mL Final   Comment: (NOTE) Therapeutic concentrations vary significantly. A range of 10-30 ug/mL  may be an effective concentration for many patients. However, some  are best treated at concentrations outside of this range. Acetaminophen  concentrations >150 ug/mL at 4 hours after ingestion  and >50 ug/mL at 12 hours after ingestion are often associated with  toxic reactions.  Performed at [facility], [address] [place], [place] [address]    Specimen Source 11/20/2023 URINE, CLEAN CATCH   Final   Color, Urine 11/20/2023 YELLOW  YELLOW Final   APPearance 11/20/2023 HAZY (A)  CLEAR Final   Specific Gravity, Urine 11/20/2023 1.008  1.005 - 1.030 Final   pH 11/20/2023 6.0  5.0 - 8.0 Final   Glucose, UA 11/20/2023 NEGATIVE  NEGATIVE mg/dL Final   Hgb urine dipstick 11/20/2023 SMALL (A)  NEGATIVE Final   Bilirubin Urine 11/20/2023 NEGATIVE  NEGATIVE Final   Ketones, ur 11/20/2023 NEGATIVE  NEGATIVE mg/dL Final   Protein, ur 96/68/7974 NEGATIVE  NEGATIVE mg/dL Final   Nitrite 96/68/7974 NEGATIVE  NEGATIVE Final   Leukocytes,Ua 11/20/2023 NEGATIVE  NEGATIVE Final   RBC / HPF 11/20/2023 0-5  0 - 5  RBC/hpf Final   WBC, UA 11/20/2023 0-5  0 - 5 WBC/hpf Final   Comment:        Reflex urine culture not performed if WBC <=10, OR if Squamous epithelial cells >5. If Squamous epithelial cells >5 suggest recollection.    Bacteria, UA 11/20/2023 RARE (A)  NONE SEEN Final   Squamous Epithelial / HPF 11/20/2023 0-5  0 - 5 /HPF Final   Mucus 11/20/2023 PRESENT   Final   Performed at [facility], [address] [place], [place] [address]   Preg, Serum 11/20/2023 NEGATIVE  NEGATIVE Final   Comment:        THE SENSITIVITY OF THIS METHODOLOGY IS >10 mIU/mL. Performed at [facility], [address] [place], [place] [address]     Blood Alcohol level:  Lab Results  Component Value Date   [facility] <15 04/27/2024   ETH <15 01/06/2024    Metabolic Disorder Labs: Lab Results  Component Value Date   HGBA1C 4.8 04/27/2024   MPG 91.06 04/27/2024   MPG 93.93 12/15/2023   No results found for: PROLACTIN Lab Results  Component Value Date   CHOL 142 04/27/2024   TRIG 59 04/27/2024   HDL 53 04/27/2024   CHOLHDL 2.7 04/27/2024   VLDL 12 04/27/2024   LDLCALC 77 04/27/2024   LDLCALC 69 12/15/2023    Therapeutic Lab Levels: No results found for: LITHIUM  No results found for: VALPROATE No results found for: CBMZ  Physical Findings   AIMS    Flowsheet Row [facility]) [facility]) [facility] Ad[facility] (Discharged[facility] Ad[facility] (Dis[facility]) [facility] Ad[facility] (Dis[facility]) [facility]  AIMS Total Score 0 0 0 0 0   AUDIT    Flowsheet Row ED [facility] 12/14/2023 in [facility] ED from 06/21/2023 in [facility] Admission  ([facility]charged) [facility] Ad[facility] (Discharged) [facility] Ad[facility] (Discharged) [facility]  Alcohol Use Disorder Identification Test Final Score (AUDIT) 24 19 0 0 1   PHQ2-9    Flowsheet Row ED from 04/27/2024 in [facility] ED from 12/14/2023 in [facility] ED from 06/21/2023 in [facility] Office Visit from 05/01/2014 in [facility] [place]  PHQ-2 Total Score 5 4 0 0  PHQ-9 Total Score 24 14 0 --   Flowsheet Row ED from 04/27/2024 in [facility] Most recent reading at 04/27/2024  3:36 PM ED from 04/27/2024 in [facility] Most recent reading at 04/27/2024  1:15 PM ED from 01/06/2024 in [facility] Department at [facility] Most recent reading at 01/06/2024  9:33 PM  C-SSRS RISK CATEGORY High Risk High Risk No Risk     Musculoskeletal  Strength & Muscle Tone: within normal limits Gait & Station: normal Patient leans: N/A  Psychiatric Specialty Exam  Presentation  General Appearance:  Disheveled  Eye Contact: Fleeting  Speech: Normal Rate  Speech Volume: Normal  Handedness: Right   Mood and Affect  Mood: Anxious; Depressed  Affect: Congruent   Thought Process  Thought Processes: Coherent; Goal Directed  Descriptions of Associations:Intact  Orientation:Full (Time, Place and Person)  Thought Content:Logical  Diagnosis of Schizophrenia or Schizoaffective disorder in past: No    Hallucinations:Hallucinations: None  Ideas of Reference:None  Suicidal Thoughts:Suicidal Thoughts: No  Homicidal Thoughts:Homicidal Thoughts: No   Sensorium  Memory: Immediate Fair; Recent Fair; Remote  Fair  Judgment: Fair  Insight: Fair  Art therapist  Concentration: Fair  Attention Span: Fair  Recall: Fair  Fund of Knowledge: Fair  Language: Fair  Psychomotor Activity  Psychomotor Activity: Psychomotor Activity: Normal  Assets  Assets: Desire for Improvement  Sleep  Sleep: Sleep: Fair  Estimated Sleeping Duration (Last 24 Hours): 16.25-17.00 hours  Nutritional Assessment (For OBS and [facility] admissions only) Has the patient had a weight loss or gain of 10 pounds or more in the last 3 months?: No Has the patient had a decrease in food intake/or appetite?: No Does the patient have dental problems?: No Does the patient have eating habits or behaviors that may be indicators of an eating disorder including binging or inducing vomiting?: No Has the patient recently lost weight without trying?: 0 Has the patient been eating poorly because of a decreased appetite?: 0 Malnutrition Screening Tool Score: 0    Physical Exam  Physical Exam ROS Blood pressure (!) 119/51, pulse (!) 50, temperature 97.7 F (36.5 C), temperature source Oral, resp. rate 20, SpO2 96%. There is no height or weight on file to calculate BMI.  Treatment Plan Summary: Daily contact with patient to assess and evaluate symptoms and progress in treatment and Medication management. Increase gabapentin  to 300mg  TID. Optimize magnesium . Continue clonidine  taper. Monitor Cr.   KANDI JAYSON HAHN, MD 04/29/2024 5:57 PM

## 2024-04-29 NOTE — Group Note (Signed)
 Group Topic: Social Support  Group Date: 04/29/2024 Start Time: 2015 End Time: 2045 Facilitators: Tanda Ogren D, NT  Department: [facility]  Number of Participants: 5  Group Focus: daily focus Treatment Modality:  Psychoeducation Interventions utilized were support Purpose: express feelings  Name: Judy Robles Date of Birth: [birth year]  MR: [id number]    Level of Participation: moderate Quality of Participation: attentive Interactions with others: gave feedback Mood/Affect: appropriate Triggers (if applicable): n/a Cognition: coherent/clear Progress: Significant Response: n/a Plan: follow-up needed  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   HCAP (healthcare-associated pneumonia) 09/16/2018   LLL pneumonia 09/16/2018   Sepsis (HCC) 09/16/2018   Hyponatremia 09/16/2018   Hypokalemia 09/16/2018   Severe benzodiazepine use disorder (HCC) 07/10/2018   Bipolar II disorder (HCC)    PTSD (post-traumatic stress disorder) 11/21/2017   Community acquired pneumonia of right lower lobe of lung 09/02/2017   Sepsis associated hypotension (HCC) 09/02/2017   AKI (acute kidney injury) (HCC) 09/02/2017   Nausea and vomiting 09/02/2017   Drug overdose 04/22/2017   Chest pain 10/09/2016   Pressure injury of skin 10/05/2016   Polysubstance dependence including opioid type drug with complication, continuous use (HCC) 07/21/2016   Chronic pain 07/03/2015   Diarrhea 05/01/2014   Primary hypertension 04/17/2014

## 2024-04-29 NOTE — Group Note (Signed)
 Group Topic: Recovery Basics  Group Date: 04/29/2024 Start Time: 1300 End Time: 1400 Facilitators: Belle Camellia SAILOR, RN  Department: [facility]  Number of Participants: 4  Group Focus: substance abuse education Treatment Modality:  Psychoeducation Interventions utilized were patient education Purpose: relapse prevention strategies and substance abuse recovery education   Name: Judy Robles Date of Birth: July 31, 1976  MR: [id number]    Level of Participation: active Quality of Participation: cooperative Interactions with others: gave feedback Mood/Affect: flat Triggers (if applicable): N/A Cognition: obsessive Progress: Minimal Response: n/a Plan: patient will be encouraged to continue with participation  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   HCAP (healthcare-associated pneumonia) 09/16/2018   LLL pneumonia 09/16/2018   Sepsis (HCC) 09/16/2018   Hyponatremia 09/16/2018   Hypokalemia 09/16/2018   Severe benzodiazepine use disorder (HCC) 07/10/2018   Bipolar II disorder (HCC)    PTSD (post-traumatic stress disorder) 11/21/2017   Community acquired pneumonia of right lower lobe of lung 09/02/2017   Sepsis associated hypotension (HCC) 09/02/2017   AKI (acute kidney injury) (HCC) 09/02/2017   Nausea and vomiting 09/02/2017   Drug overdose 04/22/2017   Chest pain 10/09/2016   Pressure injury of skin 10/05/2016   Polysubstance dependence including opioid type drug with complication, continuous use (HCC) 07/21/2016   Chronic pain 07/03/2015   Diarrhea 05/01/2014   Primary hypertension 04/17/2014

## 2024-04-30 DIAGNOSIS — R001 Bradycardia, unspecified: Secondary | ICD-10-CM | POA: Diagnosis not present

## 2024-04-30 MED ORDER — METHOCARBAMOL 750 MG PO TABS
750.0000 mg | ORAL_TABLET | Freq: Three times a day (TID) | ORAL | Status: AC | PRN
  Administered 2024-04-30 – 2024-05-02 (×3): 750 mg via ORAL
  Filled 2024-04-30 (×4): qty 1

## 2024-04-30 MED ORDER — TRAZODONE HCL 100 MG PO TABS
100.0000 mg | ORAL_TABLET | Freq: Every evening | ORAL | Status: DC | PRN
  Administered 2024-04-30 – 2024-05-02 (×3): 100 mg via ORAL
  Filled 2024-04-30 (×3): qty 1

## 2024-04-30 MED ORDER — HYDROXYZINE HCL 25 MG PO TABS
50.0000 mg | ORAL_TABLET | Freq: Three times a day (TID) | ORAL | Status: DC | PRN
  Administered 2024-04-30 – 2024-05-02 (×4): 50 mg via ORAL
  Filled 2024-04-30 (×4): qty 2

## 2024-04-30 MED ORDER — GABAPENTIN 400 MG PO CAPS
400.0000 mg | ORAL_CAPSULE | Freq: Three times a day (TID) | ORAL | Status: DC
Start: 2024-04-30 — End: 2024-05-02
  Administered 2024-04-30 – 2024-05-02 (×7): 400 mg via ORAL
  Filled 2024-04-30 (×7): qty 1

## 2024-04-30 NOTE — Group Note (Signed)
 Group Topic: Emotional Regulation  Group Date: 04/30/2024 Start Time: 1300 End Time: 1325 Facilitators: Lonzell Dwayne RAMAN, NT  Department: [facility]  Number of Participants: 3  Group Focus: anger management Treatment Modality:  Cognitive Behavioral Therapy Interventions utilized were patient education Purpose: explore maladaptive thinking  Name: Judy Robles Date of Birth: [birth year]  MR: [id number]    Level of Participation: Patient did attend groupminimal Quality of Participation: isolative Interactions with others: gave feedback Mood/Affect: flat Triggers (if applicable): N/A Cognition: not focused Progress: Minimal Response: No real response  Plan: follow-up needed  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   HCAP (healthcare-associated pneumonia) 09/16/2018   LLL pneumonia 09/16/2018   Sepsis (HCC) 09/16/2018   Hyponatremia 09/16/2018   Hypokalemia 09/16/2018   Severe benzodiazepine use disorder (HCC) 07/10/2018   Bipolar II disorder (HCC)    PTSD (post-traumatic stress disorder) 11/21/2017   Community acquired pneumonia of right lower lobe of lung 09/02/2017   Sepsis associated hypotension (HCC) 09/02/2017   AKI (acute kidney injury) (HCC) 09/02/2017   Nausea and vomiting 09/02/2017   Drug overdose 04/22/2017   Chest pain 10/09/2016   Pressure injury of skin 10/05/2016   Polysubstance dependence including opioid type drug with complication, continuous use (HCC) 07/21/2016   Chronic pain 07/03/2015   Diarrhea 05/01/2014   Primary  hypertension 04/17/2014

## 2024-04-30 NOTE — ED Provider Notes (Signed)
 Behavioral Health Progress Note  Date and Time: 04/30/2024 7:12 PM Name: Judy Robles MRN:  [id number]  HPI: Judy Robles is a 48 y.o. female patient with a documented psychiatric history significant for polysubstance abuse (opioid use, cocaine use, methamphetamine use, benzodiazepine, alcohol use), MDD, bipolar 2, and PTSD and a medical history of drug overdose, hypertension and restless leg syndrome who presented 04/27/24 to the [facility] voluntary accompanied by law enforcement with complaints of suicidal thoughts with a plan, worsening depression and, anxiety and drug abuse. Donavan, Kandi BROCKS, MD, Psychiatry, Date of Service: 04/29/2024  5:57 PM).  Patient assessment: Pt with flat affect and depressed mood, attention to personal hygiene and grooming is fair, eye contact is good, speech is clear & coherent. Thought contents are organized and logical, and pt currently denies SI/HI/AVH or paranoia. There is no evidence of delusional thoughts.  There are no overt signs of psychosis.   Patient reports feelings of tiredness worsening earlier today, reported feeling a little better as the afternoon approached.  Reports that sleep last night was poor, reports restlessness and achy legs, states that depression today is a 7, with 10 being worst.  Rates anxiety as a 5, 10 being worst.  Reports that she plans to go to rehabilitation after this detoxification.  Denies medication related issues, states that medication is slightly helpful.  Reports that she tested positive for Suboxone  prior to this hospitalization because somebody gave her a one-time dose of Suboxone  on the streets.  Patient however reports that she was purchasing oxycodones from the streets and using them, states that she was taking specifically tablets consisting of 10 mg each 3 times daily.  Denies nausea or vomiting, denies having any cravings.  Educated on supportive medications available to her should she need  them, verbalizes understanding.  We discussed the following medication adjustments:  -Increasing trazodone  from 50 to 100 mg for more assistance with sleep.  - Increasing Robaxin  from 500 mg every 8 hours to 750 mg every 8 hours as needed for muscle pain.   -Increasing gabapentin  from 300 mg to 400 mg 3 times daily for anxiety and help with restless legs.  Continuing the rest of her medications same with no changes.   -Magnesium  is also noted to be persistent low, agreeable to supplementation since this might be also causing restless legs. We will add this supplementation at nighttime so that it can also help patient with sleep.   -Vitamin D  is slightly low at 27.64, will supplement vitamin D  to 50,000 units weekly.  - Free T3 is pending, free T4 is normal, TSH 3 days ago was elevated at 5.052.  Sodium was 133 3 days ago, will recheck BMP.  Diagnosis:  Final diagnoses:  Polysubstance (including opioids) dependence with physiological dependence (HCC)  Substance induced mood disorder (HCC)  Severe episode of recurrent major depressive disorder, without psychotic features (HCC)    Total Time spent with patient: 45 minutes  Past Psychiatric History: see H & P Past Medical History: see H & P Family History: see H & P Family Psychiatric  History: see H & P Social History: see H & P  Sleep: Fair  Appetite:  Fair  Current Medications:  Current Facility-Administered Medications  Medication Dose Route Frequency Provider Last Rate Last Admin   acetaminophen  (TYLENOL ) tablet 650 mg  650 mg Oral Q6H PRN White, Patrice L, NP   650 mg at 04/30/24 0826   alum & mag hydroxide-simeth (  MAALOX/MYLANTA) 200-200-20 MG/5ML suspension 30 mL  30 mL Oral Q4H PRN White, Patrice L, NP       cloNIDine  (CATAPRES ) tablet 0.1 mg  0.1 mg Oral BH-qamhs White, Patrice L, NP   0.1 mg at 04/30/24 1517   Followed by   NOREEN ON 05/02/2024] cloNIDine  (CATAPRES ) tablet 0.1 mg  0.1 mg Oral QAC breakfast White, Patrice L,  NP       dicyclomine  (BENTYL ) tablet 20 mg  20 mg Oral Q6H PRN White, Patrice L, NP       haloperidol  (HALDOL ) tablet 5 mg  5 mg Oral TID PRN White, Patrice L, NP       And   diphenhydrAMINE  (BENADRYL ) capsule 50 mg  50 mg Oral TID PRN White, Patrice L, NP       haloperidol  lactate (HALDOL ) injection 5 mg  5 mg Intramuscular TID PRN White, Patrice L, NP       And   diphenhydrAMINE  (BENADRYL ) injection 50 mg  50 mg Intramuscular TID PRN White, Patrice L, NP       And   LORazepam  (ATIVAN ) injection 2 mg  2 mg Intramuscular TID PRN White, Patrice L, NP       haloperidol  lactate (HALDOL ) injection 10 mg  10 mg Intramuscular TID PRN White, Patrice L, NP       And   diphenhydrAMINE  (BENADRYL ) injection 50 mg  50 mg Intramuscular TID PRN White, Patrice L, NP       And   LORazepam  (ATIVAN ) injection 2 mg  2 mg Intramuscular TID PRN White, Patrice L, NP       gabapentin  (NEURONTIN ) capsule 300 mg  300 mg Oral TID Cole Kandi BROCKS, MD   300 mg at 04/30/24 1518   hydrOXYzine  (ATARAX ) tablet 25 mg  25 mg Oral TID PRN White, Patrice L, NP   25 mg at 04/30/24 9173   loperamide  (IMODIUM ) capsule 2-4 mg  2-4 mg Oral PRN White, Patrice L, NP       magnesium  hydroxide (MILK OF MAGNESIA) suspension 30 mL  30 mL Oral Daily PRN White, Patrice L, NP       magnesium  oxide (MAG-OX) tablet 400 mg  400 mg Oral QHS Hill, Corean Massa, MD   400 mg at 04/29/24 2110   methocarbamol  (ROBAXIN ) tablet 500 mg  500 mg Oral Q8H PRN White, Patrice L, NP   500 mg at 04/30/24 0826   naproxen  (NAPROSYN ) tablet 500 mg  500 mg Oral BID PRN White, Patrice L, NP   500 mg at 04/28/24 2147   nicotine  (NICODERM CQ  - dosed in mg/24 hours) patch 14 mg  14 mg Transdermal Daily Hill, Corean Massa, MD   14 mg at 04/30/24 9171   nicotine  polacrilex (NICORETTE ) gum 2 mg  2 mg Oral PRN Leigh Corean Massa, MD       ondansetron  (ZOFRAN -ODT) disintegrating tablet 4 mg  4 mg Oral Q6H PRN White, Patrice L, NP       traZODone  (DESYREL )  tablet 50 mg  50 mg Oral QHS PRN White, Patrice L, NP   50 mg at 04/29/24 2110   Vitamin D  (Ergocalciferol ) (DRISDOL ) 1.25 MG (50000 UNIT) capsule 50,000 Units  50,000 Units Oral Q7 days Leigh Corean Massa, MD   50,000 Units at 04/28/24 1504   No current outpatient medications on file.    Labs  Lab Results:  Admission on 04/27/2024  Component Date Value Ref Range Status   Free T4 04/27/2024 0.91  0.61 - 1.12 ng/dL Final   Comment: (NOTE) Biotin ingestion may interfere with free T4 tests. If the results are inconsistent with the TSH level, previous test results, or the clinical presentation, then consider biotin interference. If needed, order repeat testing after stopping biotin. Performed at [facility], [address] [place], [place] [address]   Admission on 04/27/2024, Discharged on 04/27/2024  Component Date Value Ref Range Status   WBC 04/27/2024 10.4  4.0 - 10.5 K/uL Final   RBC 04/27/2024 3.80 (L)  3.87 - 5.11 MIL/uL Final   Hemoglobin 04/27/2024 12.1  12.0 - 15.0 g/dL Final   HCT 90/93/7974 35.8 (L)  36.0 - 46.0 % Final   MCV 04/27/2024 94.2  80.0 - 100.0 fL Final   MCH 04/27/2024 31.8  26.0 - 34.0 pg Final   MCHC 04/27/2024 33.8  30.0 - 36.0 g/dL Final   RDW 90/93/7974 13.1  11.5 - 15.5 % Final   Platelets 04/27/2024 213  150 - 400 K/uL Final   nRBC 04/27/2024 0.0  0.0 - 0.2 % Final   Neutrophils Relative % 04/27/2024 62  % Final   Neutro Abs 04/27/2024 6.5  1.7 - 7.7 K/uL Final   Lymphocytes Relative 04/27/2024 28  % Final   Lymphs Abs 04/27/2024 2.9  0.7 - 4.0 K/uL Final   Monocytes Relative 04/27/2024 8  % Final   Monocytes Absolute 04/27/2024 0.8  0.1 - 1.0 K/uL Final   Eosinophils Relative 04/27/2024 1  % Final   Eosinophils Absolute 04/27/2024 0.1  0.0 - 0.5 K/uL Final   Basophils Relative 04/27/2024 1  % Final   Basophils Absolute 04/27/2024 0.1  0.0 - 0.1 K/uL Final   Immature Granulocytes 04/27/2024 0  % Final   Abs Immature Granulocytes  04/27/2024 0.04  0.00 - 0.07 K/uL Final   Performed at [facility], [address], [place], [place] [address]   Sodium 04/27/2024 133 (L)  135 - 145 mmol/L Final   Potassium 04/27/2024 3.5  3.5 - 5.1 mmol/L Final   Chloride 04/27/2024 101  98 - 111 mmol/L Final   CO2 04/27/2024 18 (L)  22 - 32 mmol/L Final   Glucose, Bld 04/27/2024 94  70 - 99 mg/dL Final   Glucose reference range applies only to samples taken after fasting for at least 8 hours.   BUN 04/27/2024 19  6 - 20 mg/dL Final   Creatinine, Ser 04/27/2024 1.36 (H)  0.44 - 1.00 mg/dL Final   Calcium  04/27/2024 8.7 (L)  8.9 - 10.3 mg/dL Final   Total Protein 90/93/7974 7.5  6.5 - 8.1 g/dL Final   Albumin 90/93/7974 3.6  3.5 - 5.0 g/dL Final   AST 90/93/7974 70 (H)  15 - 41 U/L Final   ALT 04/27/2024 39  0 - 44 U/L Final   Alkaline Phosphatase 04/27/2024 59  38 - 126 U/L Final   Total Bilirubin 04/27/2024 0.8  0.0 - 1.2 mg/dL Final   GFR, Estimated 04/27/2024 47 (L)  >60 mL/min Final   Comment: (NOTE) Calculated using the CKD-EPI Creatinine Equation (2021)    Anion gap 04/27/2024 14  5 - 15 Final   Performed at [facility], [address], [place], [place] [address]   Hgb A1c MFr Bld 04/27/2024 4.8  4.8 - 5.6 % Final   Comment: (NOTE) Diagnosis of Diabetes The following HbA1c ranges recommended by the American Diabetes Association (ADA) may be used as an aid in the diagnosis  of diabetes mellitus.  Hemoglobin             Suggested A1C NGSP%              Diagnosis  <5.7                   Non Diabetic  5.7-6.4                Pre-Diabetic  >6.4                   Diabetic  <7.0                   Glycemic control for                       adults with diabetes.     Mean Plasma Glucose 04/27/2024 91.06  mg/dL Final   Performed at [facility], [address] [place], [place] [address]   Alcohol, Ethyl (B) 04/27/2024 <15  <15 mg/dL Final   Comment: (NOTE) For medical purposes only. Performed at [facility], [address], [name], [place] [address]    Cholesterol 04/27/2024 142  0 - 200 mg/dL Final   Triglycerides 90/93/7974 59  <150 mg/dL Final   HDL 90/93/7974 53  >40 mg/dL Final   Total CHOL/HDL Ratio 04/27/2024 2.7  RATIO Final   VLDL 04/27/2024 12  0 - 40 mg/dL Final   LDL Cholesterol 04/27/2024 77  0 - 99 mg/dL Final   Comment:        Total Cholesterol/HDL:CHD Risk Coronary Heart Disease Risk Table                     Men   Women  1/2 Average Risk   3.4   3.3  Average Risk       5.0   4.4  2 X Average Risk   9.6   7.1  3 X Average Risk  23.4   11.0        Use the calculated Patient Ratio above and the CHD Risk Table to determine the patient's CHD Risk.        ATP III CLASSIFICATION (LDL):  <100     mg/dL   Optimal  899-870  mg/dL   Near or Above                    Optimal  130-159  mg/dL   Borderline  839-810  mg/dL   High  >809     mg/dL   Very High Performed at [facility], [address] [place], [place] [address]    TSH 04/27/2024 5.052 (H)  0.350 - 4.500 uIU/mL Final   Comment: Performed by a 3rd Generation assay with a functional sensitivity of <=0.01 uIU/mL. Performed at [facility], [address], [place], [place] [address]    RPR Ser Ql 04/27/2024 NON REACTIVE  NON REACTIVE Final   Performed at [facility], [address], [place], [place] [address]   POC Amphetamine  UR 04/27/2024 None Detected  NONE DETECTED (Cut Off Level 1000 ng/mL) Final   POC Secobarbital (BAR) 04/27/2024 None Detected  NONE DETECTED (Cut Off Level 300 ng/mL) Final   POC Buprenorphine  (BUP) 04/27/2024 Positive (A)  NONE DETECTED (Cut Off Level 10 ng/mL) Final   POC Oxazepam (BZO) 04/27/2024 None Detected  NONE DETECTED (Cut Off Level 300  ng/mL) Final   POC Cocaine UR 04/27/2024 None Detected  NONE DETECTED (Cut Off Level 300 ng/mL) Final   POC Methamphetamine UR 04/27/2024 Positive (A)  NONE DETECTED (Cut Off Level 1000 ng/mL) Final   POC Morphine   04/27/2024 None Detected  NONE DETECTED (Cut Off Level 300 ng/mL) Final   POC Methadone UR 04/27/2024 None Detected  NONE DETECTED (Cut Off Level 300 ng/mL) Final   POC Oxycodone  UR 04/27/2024 Positive (A)  NONE DETECTED (Cut Off Level 100 ng/mL) Final   POC Marijuana UR 04/27/2024 None Detected  NONE DETECTED (Cut Off Level 50 ng/mL) Final   Vit D, 25-Hydroxy 04/27/2024 27.64 (L)  30 - 100 ng/mL Final   Comment: (NOTE) Vitamin D  deficiency has been defined by the Institute of Medicine  and an Endocrine Society practice guideline as a level of serum 25-OH  vitamin D  less than 20 ng/mL (1,2). The Endocrine Society went on to  further define vitamin D  insufficiency as a level between 21 and 29  ng/mL (2).  1. IOM (Institute of Medicine). 2010. Dietary reference intakes for  calcium  and D. [place]: The [employer]. 2. Holick MF, Binkley Novinger, Bischoff-Ferrari HA, et al. Evaluation,  treatment, and prevention of vitamin D  deficiency: an Endocrine  Society clinical practice guideline, JCEM. 2011 Jul; 96(7): 1911-30.  Performed at [facility], [address], [place], [place] [address]    Vitamin B-12 04/27/2024 369  180 - 914 pg/mL Final   Comment: (NOTE) This assay is not validated for testing neonatal or myeloproliferative syndrome specimens for Vitamin B12 levels. Performed at [facility], [address] [place], [place] [address]    Magnesium  04/27/2024 1.6 (L)  1.7 - 2.4 mg/dL Final   Performed at [facility], [address], [place], [place] [address]   HIV Screen 4th Generation wRfx 04/27/2024 Non Reactive  Non Reactive Final   Performed at [facility], [address], [place], [place] [address]  Admission on 01/06/2024, Discharged on 01/08/2024  Component Date Value Ref Range Status   Sodium 01/06/2024 133 (L)  135 - 145 mmol/L Final   Potassium 01/06/2024 4.4  3.5 - 5.1 mmol/L Final   Chloride 01/06/2024 103  98 - 111 mmol/L Final    CO2 01/06/2024 22  22 - 32 mmol/L Final   Glucose, Bld 01/06/2024 94  70 - 99 mg/dL Final   Glucose reference range applies only to samples taken after fasting for at least 8 hours.   BUN 01/06/2024 26 (H)  6 - 20 mg/dL Final   Creatinine, Ser 01/06/2024 1.17 (H)  0.44 - 1.00 mg/dL Final   Calcium  01/06/2024 8.6 (L)  8.9 - 10.3 mg/dL Final   Total Protein 94/82/7974 7.4  6.5 - 8.1 g/dL Final   Albumin 94/82/7974 3.3 (L)  3.5 - 5.0 g/dL Final   AST 94/82/7974 24  15 - 41 U/L Final   ALT 01/06/2024 19  0 - 44 U/L Final   Alkaline Phosphatase 01/06/2024 72  38 - 126 U/L Final   Total Bilirubin 01/06/2024 0.6  0.0 - 1.2 mg/dL Final   GFR, Estimated 01/06/2024 57 (L)  >60 mL/min Final   Comment: (NOTE) Calculated using the CKD-EPI Creatinine Equation (2021)    Anion gap 01/06/2024 8  5 - 15 Final   Performed at [facility], [address] [place], [place] [address]   Alcohol, Ethyl (B) 01/06/2024 <15  <15 mg/dL Final  Comment: Please note change in reference range. (NOTE) For medical purposes only. Performed at [facility], [address] [place], [place] [address]    WBC 01/06/2024 7.1  4.0 - 10.5 K/uL Final   RBC 01/06/2024 3.90  3.87 - 5.11 MIL/uL Final   Hemoglobin 01/06/2024 12.4  12.0 - 15.0 g/dL Final   HCT 94/82/7974 38.7  36.0 - 46.0 % Final   MCV 01/06/2024 99.2  80.0 - 100.0 fL Final   MCH 01/06/2024 31.8  26.0 - 34.0 pg Final   MCHC 01/06/2024 32.0  30.0 - 36.0 g/dL Final   RDW 94/82/7974 12.9  11.5 - 15.5 % Final   Platelets 01/06/2024 249  150 - 400 K/uL Final   nRBC 01/06/2024 0.0  0.0 - 0.2 % Final   Performed at [facility], [address] [place], [place] [address]   Opiates 01/06/2024 NONE DETECTED  NONE DETECTED Final   Cocaine 01/06/2024 POSITIVE (A)  NONE DETECTED Final   Benzodiazepines 01/06/2024 POSITIVE (A)  NONE DETECTED Final   Amphetamines 01/06/2024 POSITIVE (A)  NONE DETECTED Final    Comment: (NOTE) Trazodone  is metabolized in vivo to several metabolites, including pharmacologically active m-CPP, which is excreted in the urine. Immunoassay screens for amphetamines and MDMA have potential cross-reactivity with these compounds and may provide false positive  results.     Tetrahydrocannabinol 01/06/2024 POSITIVE (A)  NONE DETECTED Final   Barbiturates 01/06/2024 NONE DETECTED  NONE DETECTED Final   Comment: (NOTE) DRUG SCREEN FOR MEDICAL PURPOSES ONLY.  IF CONFIRMATION IS NEEDED FOR ANY PURPOSE, NOTIFY LAB WITHIN 5 DAYS.  LOWEST DETECTABLE LIMITS FOR URINE DRUG SCREEN Drug Class                     Cutoff (ng/mL) Amphetamine  and metabolites    1000 Barbiturate and metabolites    200 Benzodiazepine                 200 Opiates and metabolites        300 Cocaine and metabolites        300 THC                            50 Performed at [facility], [address] [place], [place] [address]    Preg, Serum 01/06/2024 NEGATIVE  NEGATIVE Final   Comment:        THE SENSITIVITY OF THIS METHODOLOGY IS >10 mIU/mL. Performed at [facility], [address], [place], [place] [address]   Admission on 01/02/2024, Discharged on 01/02/2024  Component Date Value Ref Range Status   SARS Coronavirus 2 by RT PCR 01/02/2024 NEGATIVE  NEGATIVE Final   Influenza A by PCR 01/02/2024 NEGATIVE  NEGATIVE Final   Influenza B by PCR 01/02/2024 NEGATIVE  NEGATIVE Final   Comment: (NOTE) The Xpert Xpress SARS-CoV-2/FLU/RSV plus assay is intended as an aid in the diagnosis of influenza from Nasopharyngeal swab specimens and should not be used as a sole basis for treatment. Nasal washings and aspirates are unacceptable for Xpert Xpress SARS-CoV-2/FLU/RSV testing.  Fact Sheet for Patients: BloggerCourse.com  Fact Sheet for Healthcare Providers: SeriousBroker.it  This test is not yet approved or  cleared by the [place]  FDA and has been authorized for detection and/or diagnosis of SARS-CoV-2 by FDA under an Emergency Use Authorization (EUA). This EUA will remain in effect (meaning this test can be  used) for the duration of the COVID-19 declaration under Section 564(b)(1) of the Act, 21 U.S.C. section 360bbb-3(b)(1), unless the authorization is terminated or revoked.     Resp Syncytial Virus by PCR 01/02/2024 NEGATIVE  NEGATIVE Final   Comment: (NOTE) Fact Sheet for Patients: BloggerCourse.com  Fact Sheet for Healthcare Providers: SeriousBroker.it  This test is not yet approved or cleared by the [place]  FDA and has been authorized for detection and/or diagnosis of SARS-CoV-2 by FDA under an Emergency Use Authorization (EUA). This EUA will remain in effect (meaning this test can be used) for the duration of the COVID-19 declaration under Section 564(b)(1) of the Act, 21 U.S.C. section 360bbb-3(b)(1), unless the authorization is terminated or revoked.  Performed at [facility], [address], [place], [place] [address]    WBC 01/02/2024 7.9  4.0 - 10.5 K/uL Final   RBC 01/02/2024 4.18  3.87 - 5.11 MIL/uL Final   Hemoglobin 01/02/2024 13.2  12.0 - 15.0 g/dL Final   HCT 94/86/7974 40.9  36.0 - 46.0 % Final   MCV 01/02/2024 97.8  80.0 - 100.0 fL Final   MCH 01/02/2024 31.6  26.0 - 34.0 pg Final   MCHC 01/02/2024 32.3  30.0 - 36.0 g/dL Final   RDW 94/86/7974 12.9  11.5 - 15.5 % Final   Platelets 01/02/2024 230  150 - 400 K/uL Final   nRBC 01/02/2024 0.0  0.0 - 0.2 % Final   Neutrophils Relative % 01/02/2024 63  % Final   Neutro Abs 01/02/2024 5.0  1.7 - 7.7 K/uL Final   Lymphocytes Relative 01/02/2024 26  % Final   Lymphs Abs 01/02/2024 2.1  0.7 - 4.0 K/uL Final   Monocytes Relative 01/02/2024 9  % Final   Monocytes Absolute 01/02/2024 0.7  0.1 - 1.0 K/uL Final   Eosinophils Relative 01/02/2024 1  % Final    Eosinophils Absolute 01/02/2024 0.1  0.0 - 0.5 K/uL Final   Basophils Relative 01/02/2024 1  % Final   Basophils Absolute 01/02/2024 0.1  0.0 - 0.1 K/uL Final   Immature Granulocytes 01/02/2024 0  % Final   Abs Immature Granulocytes 01/02/2024 0.02  0.00 - 0.07 K/uL Final   Performed at [facility], [address], [place], [place] [address]   Sodium 01/02/2024 138  135 - 145 mmol/L Final   Potassium 01/02/2024 3.7  3.5 - 5.1 mmol/L Final   Chloride 01/02/2024 105  98 - 111 mmol/L Final   CO2 01/02/2024 22  22 - 32 mmol/L Final   Glucose, Bld 01/02/2024 96  70 - 99 mg/dL Final   Glucose reference range applies only to samples taken after fasting for at least 8 hours.   BUN 01/02/2024 10  6 - 20 mg/dL Final   Creatinine, Ser 01/02/2024 0.74  0.44 - 1.00 mg/dL Final   Calcium  01/02/2024 9.3  8.9 - 10.3 mg/dL Final   Total Protein 94/86/7974 7.7  6.5 - 8.1 g/dL Final   Albumin 94/86/7974 3.1 (L)  3.5 - 5.0 g/dL Final   AST 94/86/7974 26  15 - 41 U/L Final   ALT 01/02/2024 22  0 - 44 U/L Final   Alkaline Phosphatase 01/02/2024 66  38 - 126 U/L Final   Total Bilirubin 01/02/2024 0.5  0.0 - 1.2 mg/dL Final   GFR, Estimated 01/02/2024 >60  >60 mL/min Final   Comment: (NOTE) Calculated using the CKD-EPI Creatinine Equation (2021)    Anion gap 01/02/2024 11  5 -  15 Final   Performed at [facility], [address], [place], [place] [address]  Admission on 12/14/2023, Discharged on 12/17/2023  Component Date Value Ref Range Status   WBC 12/15/2023 4.4  4.0 - 10.5 K/uL Final   RBC 12/15/2023 3.54 (L)  3.87 - 5.11 MIL/uL Final   Hemoglobin 12/15/2023 11.3 (L)  12.0 - 15.0 g/dL Final   HCT 95/74/7974 34.7 (L)  36.0 - 46.0 % Final   MCV 12/15/2023 98.0  80.0 - 100.0 fL Final   MCH 12/15/2023 31.9  26.0 - 34.0 pg Final   MCHC 12/15/2023 32.6  30.0 - 36.0 g/dL Final   RDW 95/74/7974 14.2  11.5 - 15.5 % Final   Platelets 12/15/2023 150  150 - 400 K/uL Final   nRBC 12/15/2023 0.0   0.0 - 0.2 % Final   Neutrophils Relative % 12/15/2023 47  % Final   Neutro Abs 12/15/2023 2.1  1.7 - 7.7 K/uL Final   Lymphocytes Relative 12/15/2023 41  % Final   Lymphs Abs 12/15/2023 1.8  0.7 - 4.0 K/uL Final   Monocytes Relative 12/15/2023 8  % Final   Monocytes Absolute 12/15/2023 0.4  0.1 - 1.0 K/uL Final   Eosinophils Relative 12/15/2023 3  % Final   Eosinophils Absolute 12/15/2023 0.1  0.0 - 0.5 K/uL Final   Basophils Relative 12/15/2023 1  % Final   Basophils Absolute 12/15/2023 0.0  0.0 - 0.1 K/uL Final   Immature Granulocytes 12/15/2023 0  % Final   Abs Immature Granulocytes 12/15/2023 0.01  0.00 - 0.07 K/uL Final   Performed at [facility], [address] [place], [place] [address]   Sodium 12/15/2023 136  135 - 145 mmol/L Final   Potassium 12/15/2023 4.2  3.5 - 5.1 mmol/L Final   Chloride 12/15/2023 106  98 - 111 mmol/L Final   CO2 12/15/2023 21 (L)  22 - 32 mmol/L Final   Glucose, Bld 12/15/2023 77  70 - 99 mg/dL Final   Glucose reference range applies only to samples taken after fasting for at least 8 hours.   BUN 12/15/2023 23 (H)  6 - 20 mg/dL Final   Creatinine, Ser 12/15/2023 0.72  0.44 - 1.00 mg/dL Final   Calcium  12/15/2023 8.2 (L)  8.9 - 10.3 mg/dL Final   Total Protein 95/74/7974 5.8 (L)  6.5 - 8.1 g/dL Final   Albumin 95/74/7974 2.5 (L)  3.5 - 5.0 g/dL Final   AST 95/74/7974 40  15 - 41 U/L Final   ALT 12/15/2023 36  0 - 44 U/L Final   Alkaline Phosphatase 12/15/2023 40  38 - 126 U/L Final   Total Bilirubin 12/15/2023 0.5  0.0 - 1.2 mg/dL Final   GFR, Estimated 12/15/2023 >60  >60 mL/min Final   Comment: (NOTE) Calculated using the CKD-EPI Creatinine Equation (2021)    Anion gap 12/15/2023 9  5 - 15 Final   Performed at [facility], [address] [place], [place] [address]   Hgb A1c MFr Bld 12/15/2023 4.9  4.8 - 5.6 % Final   Comment: (NOTE) Pre diabetes:          5.7%-6.4%  Diabetes:              >6.4%  Glycemic control for    <7.0% adults with diabetes    Mean Plasma Glucose 12/15/2023 93.93  mg/dL Final   Performed at [facility], [address], [place], [place] [address]  Alcohol, Ethyl (B) 12/15/2023 <15  <15 mg/dL Final   Comment: Please note change in reference range. (NOTE) For medical purposes only. Performed at [facility], [address] [place], [place] [address]    RPR Ser Ql 12/15/2023 NON REACTIVE  NON REACTIVE Final   Performed at [facility], [address] [place], [place] [address]   HIV Screen 4th Generation wRfx 12/15/2023 Non Reactive  Non Reactive Final   Performed at [facility], [address] [place], [place] [address]   Cholesterol 12/15/2023 133  0 - 200 mg/dL Final   Triglycerides 95/74/7974 34  <150 mg/dL Final   HDL 95/74/7974 57  >40 mg/dL Final   Total CHOL/HDL Ratio 12/15/2023 2.3  RATIO Final   VLDL 12/15/2023 7  0 - 40 mg/dL Final   LDL Cholesterol 12/15/2023 69  0 - 99 mg/dL Final   Comment:        Total Cholesterol/HDL:CHD Risk Coronary Heart Disease Risk Table                     Men   Women  1/2 Average Risk   3.4   3.3  Average Risk       5.0   4.4  2 X Average Risk   9.6   7.1  3 X Average Risk  23.4   11.0        Use the calculated Patient Ratio above and the CHD Risk Table to determine the patient's CHD Risk.        ATP III CLASSIFICATION (LDL):  <100     mg/dL   Optimal  899-870  mg/dL   Near or Above                    Optimal  130-159  mg/dL   Borderline  839-810  mg/dL   High  >809     mg/dL   Very High Performed at [facility], [address], [place], [place] [address]    TSH 12/15/2023 2.653  0.350 - 4.500 uIU/mL Final   Comment: Performed by a 3rd Generation assay with a functional sensitivity of <=0.01 uIU/mL. Performed at [facility], [address], [place], [place] [address]   Admission on 12/14/2023, Discharged on 12/14/2023  Component Date Value Ref Range Status   Neisseria Gonorrhea  12/14/2023 Negative   Final   Chlamydia 12/14/2023 Negative   Final   Comment 12/14/2023 Normal Reference Ranger Chlamydia - Negative   Final   Comment 12/14/2023 Normal Reference Range Neisseria Gonorrhea - Negative   Final   Preg Test, Ur 12/14/2023 Negative  Negative Final   POC Amphetamine  UR 12/14/2023 None Detected  NONE DETECTED (Cut Off Level 1000 ng/mL) Final   POC Secobarbital (BAR) 12/14/2023 None Detected  NONE DETECTED (Cut Off Level 300 ng/mL) Final   POC Buprenorphine  (BUP) 12/14/2023 None Detected  NONE DETECTED (Cut Off Level 10 ng/mL) Final   POC Oxazepam (BZO) 12/14/2023 None Detected  NONE DETECTED (Cut Off Level 300 ng/mL) Final   POC Cocaine UR 12/14/2023 Positive (A)  NONE DETECTED (Cut Off Level 300 ng/mL) Final   POC Methamphetamine UR 12/14/2023 None Detected  NONE DETECTED (Cut Off Level 1000 ng/mL) Final   POC Morphine  12/14/2023 None Detected  NONE DETECTED (Cut Off Level 300 ng/mL) Final   POC Methadone UR 12/14/2023 None Detected  NONE DETECTED (Cut Off Level 300 ng/mL) Final  POC Oxycodone  UR 12/14/2023 Positive (A)  NONE DETECTED (Cut Off Level 100 ng/mL) Final   POC Marijuana UR 12/14/2023 Positive (A)  NONE DETECTED (Cut Off Level 50 ng/mL) Final  Admission on 11/20/2023, Discharged on 11/20/2023  Component Date Value Ref Range Status   Sodium 11/20/2023 138  135 - 145 mmol/L Final   Potassium 11/20/2023 3.5  3.5 - 5.1 mmol/L Final   Chloride 11/20/2023 102  98 - 111 mmol/L Final   CO2 11/20/2023 25  22 - 32 mmol/L Final   Glucose, Bld 11/20/2023 89  70 - 99 mg/dL Final   Glucose reference range applies only to samples taken after fasting for at least 8 hours.   BUN 11/20/2023 9  6 - 20 mg/dL Final   Creatinine, Ser 11/20/2023 0.87  0.44 - 1.00 mg/dL Final   Calcium  11/20/2023 9.5  8.9 - 10.3 mg/dL Final   Total Protein 96/68/7974 7.7  6.5 - 8.1 g/dL Final   Albumin 96/68/7974 3.5  3.5 - 5.0 g/dL Final   AST 96/68/7974 81 (H)  15 - 41 U/L Final   ALT  11/20/2023 60 (H)  0 - 44 U/L Final   Alkaline Phosphatase 11/20/2023 62  38 - 126 U/L Final   Total Bilirubin 11/20/2023 0.5  0.0 - 1.2 mg/dL Final   GFR, Estimated 11/20/2023 >60  >60 mL/min Final   Comment: (NOTE) Calculated using the CKD-EPI Creatinine Equation (2021)    Anion gap 11/20/2023 11  5 - 15 Final   Performed at [facility], [address] [place], [place] [address]   WBC 11/20/2023 6.2  4.0 - 10.5 K/uL Final   RBC 11/20/2023 4.02  3.87 - 5.11 MIL/uL Final   Hemoglobin 11/20/2023 12.8  12.0 - 15.0 g/dL Final   HCT 96/68/7974 39.0  36.0 - 46.0 % Final   MCV 11/20/2023 97.0  80.0 - 100.0 fL Final   MCH 11/20/2023 31.8  26.0 - 34.0 pg Final   MCHC 11/20/2023 32.8  30.0 - 36.0 g/dL Final   RDW 96/68/7974 14.4  11.5 - 15.5 % Final   Platelets 11/20/2023 164  150 - 400 K/uL Final   nRBC 11/20/2023 0.0  0.0 - 0.2 % Final   Neutrophils Relative % 11/20/2023 53  % Final   Neutro Abs 11/20/2023 3.3  1.7 - 7.7 K/uL Final   Lymphocytes Relative 11/20/2023 33  % Final   Lymphs Abs 11/20/2023 2.1  0.7 - 4.0 K/uL Final   Monocytes Relative 11/20/2023 8  % Final   Monocytes Absolute 11/20/2023 0.5  0.1 - 1.0 K/uL Final   Eosinophils Relative 11/20/2023 5  % Final   Eosinophils Absolute 11/20/2023 0.3  0.0 - 0.5 K/uL Final   Basophils Relative 11/20/2023 1  % Final   Basophils Absolute 11/20/2023 0.1  0.0 - 0.1 K/uL Final   Immature Granulocytes 11/20/2023 0  % Final   Abs Immature Granulocytes 11/20/2023 0.02  0.00 - 0.07 K/uL Final   Performed at [facility], [address], [place], [place] [address]   Alcohol, Ethyl (B) 11/20/2023 144 (H)  <10 mg/dL Final   Comment: (NOTE) Lowest detectable limit for serum alcohol is 10 mg/dL.  For medical purposes only. Performed at [facility], [address], [place], [place] [address]    Acetaminophen  (Tylenol ), Serum 11/20/2023 <10 (L)  10 - 30 ug/mL Final   Comment: (NOTE) Therapeutic concentrations vary  significantly. A range of 10-30 ug/mL  may  be an effective concentration for many patients. However, some  are best treated at concentrations outside of this range. Acetaminophen  concentrations >150 ug/mL at 4 hours after ingestion  and >50 ug/mL at 12 hours after ingestion are often associated with  toxic reactions.  Performed at [facility], [address] [place], [place] [address]    Specimen Source 11/20/2023 URINE, CLEAN CATCH   Final   Color, Urine 11/20/2023 YELLOW  YELLOW Final   APPearance 11/20/2023 HAZY (A)  CLEAR Final   Specific Gravity, Urine 11/20/2023 1.008  1.005 - 1.030 Final   pH 11/20/2023 6.0  5.0 - 8.0 Final   Glucose, UA 11/20/2023 NEGATIVE  NEGATIVE mg/dL Final   Hgb urine dipstick 11/20/2023 SMALL (A)  NEGATIVE Final   Bilirubin Urine 11/20/2023 NEGATIVE  NEGATIVE Final   Ketones, ur 11/20/2023 NEGATIVE  NEGATIVE mg/dL Final   Protein, ur 96/68/7974 NEGATIVE  NEGATIVE mg/dL Final   Nitrite 96/68/7974 NEGATIVE  NEGATIVE Final   Leukocytes,Ua 11/20/2023 NEGATIVE  NEGATIVE Final   RBC / HPF 11/20/2023 0-5  0 - 5 RBC/hpf Final   WBC, UA 11/20/2023 0-5  0 - 5 WBC/hpf Final   Comment:        Reflex urine culture not performed if WBC <=10, OR if Squamous epithelial cells >5. If Squamous epithelial cells >5 suggest recollection.    Bacteria, UA 11/20/2023 RARE (A)  NONE SEEN Final   Squamous Epithelial / HPF 11/20/2023 0-5  0 - 5 /HPF Final   Mucus 11/20/2023 PRESENT   Final   Performed at [facility], [address], [place], [place] [address]   Preg, Serum 11/20/2023 NEGATIVE  NEGATIVE Final   Comment:        THE SENSITIVITY OF THIS METHODOLOGY IS >10 mIU/mL. Performed at [facility], [address], [place], [place] [address]     Blood Alcohol level:  Lab Results  Component Value Date   [facility] <15 04/27/2024   ETH <15 01/06/2024    Metabolic Disorder Labs: Lab Results  Component Value Date   HGBA1C 4.8 04/27/2024   MPG 91.06  04/27/2024   MPG 93.93 12/15/2023   No results found for: PROLACTIN Lab Results  Component Value Date   CHOL 142 04/27/2024   TRIG 59 04/27/2024   HDL 53 04/27/2024   CHOLHDL 2.7 04/27/2024   VLDL 12 04/27/2024   LDLCALC 77 04/27/2024   LDLCALC 69 12/15/2023    Therapeutic Lab Levels: No results found for: LITHIUM  No results found for: VALPROATE No results found for: CBMZ  Physical Findings   AIMS    Flowsheet Row [facility]) [facility]) [facility] Ad[facility] (Discharged[facility] Ad[facility] (Discharged) [facility] Ad[facility] (Dis[facility]) [facility]  AIMS Total Score 0 0 0 0 0   AUDIT    Flowsheet Row ED from 12/14/2023 in [facility] ED from 06/21/2023 in [facility] Admission (Discharged) [facility] Ad[facility] (Discharged[facility] Ad[facility] (Discharged) [facility]  Alcohol Use Disorder Identification Test Final Score (AUDIT) 24 19 0 0 1   PHQ2-9    Flowsheet Row ED from  04/27/2024 in [facility] ED from 12/14/2023 in [facility] ED from 06/21/2023 in [facility] Office Visit from 05/01/2014 in [facility] [place]  PHQ-2 Total Score 5 4 0 0  PHQ-9 Total Score 24 14 0 --   Flowsheet Row ED from 04/27/2024 in [facility] Most recent reading at 04/27/2024  3:36 PM ED from 04/27/2024 in [facility] Most recent reading at 04/27/2024  1:15 PM ED from 01/06/2024 in [facility] Emergency Department at [facility] Most recent reading at 01/06/2024  9:33 PM  C-SSRS RISK CATEGORY High Risk High Risk No Risk     Musculoskeletal  Strength & Muscle Tone: within normal limits Gait & Station: normal Patient leans: N/A  Psychiatric Specialty Exam  Presentation  General Appearance:  Fairly Groomed  Eye Contact: Fair  Speech: Clear and Coherent  Speech Volume: Normal  Handedness: Right   Mood and Affect  Mood: Depressed; Anxious  Affect: Congruent   Thought Process  Thought Processes: Coherent  Descriptions of Associations:Intact  Orientation:Full (Time, Place and Person)  Thought Content:Logical  Diagnosis of Schizophrenia or Schizoaffective disorder in past: No    Hallucinations:Hallucinations: None  Ideas of Reference:None  Suicidal Thoughts:Suicidal Thoughts: No  Homicidal Thoughts:Homicidal Thoughts: No   Sensorium  Memory: Immediate Fair  Judgment: Fair  Insight: Fair   Art therapist  Concentration: Fair  Attention Span: Fair  Recall: Fair  Fund of Knowledge: Fair  Language: Fair   Psychomotor Activity  Psychomotor Activity: Psychomotor Activity: Normal   Assets  Assets: Resilience   Sleep  Sleep: Sleep: Fair  Estimated Sleeping Duration (Last 24 Hours): 15.25-18.75 hours  Nutritional Assessment (For OBS and [facility]BC admissions only) Has the patient had a weight loss or gain of 10 pounds or more in the last 3 months?: No Has the patient had a decrease in food intake/or appetite?: No Does the patient have dental problems?: No Does the patient have eating habits or behaviors that may be indicators of an eating disorder including binging or inducing vomiting?: No Has the patient recently lost weight without trying?: 0 Has the patient been eating poorly because of a decreased appetite?:  0 Malnutrition Screening Tool Score: 0   Physical Exam  Physical Exam Constitutional:      Appearance: Normal appearance.  Musculoskeletal:     Cervical back: Normal range of motion.  Neurological:     General: No focal deficit present.     Mental Status: She is alert and oriented to person, place, and time.    Review of Systems  Psychiatric/Behavioral:  Positive for depression and substance abuse. Negative for hallucinations, memory loss and suicidal ideas. The patient is nervous/anxious and has insomnia.   All other systems reviewed and are negative.  Blood pressure 122/64, pulse 60, temperature 97.9 F (36.6 C), temperature source Oral, resp. rate 17, SpO2 95%. There is no height or weight on file to calculate BMI.  Treatment Plan Summary: Daily contact with patient to assess and evaluate symptoms and progress in treatment and Medication management  Safety and Monitoring: Voluntary admission to inpatient psychiatric unit for safety, stabilization and treatment Daily contact with patient to assess and evaluate symptoms and progress in treatment Patient's case to be discussed in multi-disciplinary team meeting Observation Level : q15 minute checks Vital signs: q12 hours Precautions: Safety  Long Term Goal(s): Improvement  in symptoms so as ready for discharge  Short Term Goals: Ability to identify changes in lifestyle to reduce recurrence of condition will improve, Ability to verbalize feelings will improve, Ability to disclose and discuss suicidal ideas, Ability to demonstrate self-control will improve, Ability to identify and develop effective coping behaviors will improve, Ability to maintain clinical measurements within normal limits will improve, Compliance with prescribed medications will improve, and Ability to identify triggers associated with substance abuse/mental health issues will improve  Diagnoses Principal Problem:   Polysubstance dependence including opioid type  drug with complication, continuous use (HCC) Active Problems:   PTSD (post-traumatic stress disorder)   Bipolar II disorder (HCC)   Severe benzodiazepine use disorder (HCC)   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)   Opiate abuse, continuous (HCC)   Alcohol use disorder   Substance-induced sleep disorder (HCC)   MDD (major depressive disorder), recurrent, severe, with psychosis (HCC)   Substance induced mood disorder (HCC)  Meds: Please see the MAR for a detailed meds list   PRNS -Continue Tylenol  650 mg every 6 hours PRN for mild pain -Continue Maalox 30 mg every 4 hrs PRN for indigestion -Continue Milk of Magnesia as needed every 6 hrs for constipation  Discharge Planning: Social work and case management to assist with discharge planning and identification of hospital follow-up needs prior to discharge Estimated LOS: 5-7 days Discharge Concerns: Need to establish a safety plan; Medication compliance and effectiveness Discharge Goals: Return home with outpatient referrals for mental health follow-up including medication management/psychotherapy  I certify that inpatient services furnished can reasonably be expected to improve the patient's condition.    Donia Snell, NP 9/9/20257:12 PM   Donia Snell, NP 04/30/2024 7:12 PM

## 2024-04-30 NOTE — Group Note (Signed)
 Group Topic: Relapse and Recovery  Group Date: 04/30/2024 Start Time: 1945 End Time: 2045 Facilitators: Joan Plowman B  Department: [facility]  Number of Participants: 4  Group Focus: substance abuse education Treatment Modality:  Spiritual Interventions utilized were support Purpose: relapse prevention strategies and trigger / craving management  Name: Judy Robles Date of Birth: [birth year]  MR: [id number]    Level of Participation: active Quality of Participation: cooperative Interactions with others: gave feedback Mood/Affect: appropriate Triggers (if applicable): NA Cognition: coherent/clear Progress: Gaining insight Response: NA Plan: patient will be encouraged to keep going to groups  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   HCAP (healthcare-associated pneumonia) 09/16/2018   LLL pneumonia 09/16/2018   Sepsis (HCC) 09/16/2018   Hyponatremia 09/16/2018   Hypokalemia 09/16/2018   Severe benzodiazepine use disorder (HCC) 07/10/2018   Bipolar II disorder (HCC)    PTSD (post-traumatic stress disorder) 11/21/2017   Community acquired pneumonia of right lower lobe of lung 09/02/2017   Sepsis associated hypotension (HCC) 09/02/2017   AKI (acute kidney injury) (HCC) 09/02/2017   Nausea and vomiting 09/02/2017   Drug overdose 04/22/2017   Chest pain 10/09/2016   Pressure injury of skin 10/05/2016   Polysubstance dependence including opioid type drug with complication, continuous use (HCC) 07/21/2016   Chronic pain 07/03/2015   Diarrhea  05/01/2014   Primary hypertension 04/17/2014

## 2024-04-30 NOTE — ED Notes (Signed)
 Patient resting with eyes closed in no apparent acute distress. Respirations even and unlabored. Environment secured. Safety checks in place according to facility policy.

## 2024-04-30 NOTE — ED Notes (Signed)
 Patient alert & oriented x4. Denies intent to harm self or others when asked. Denies A/VH. Patient reports pain rating 8/10 in bilateral legs, and anxiety rating 6/10. Scheduled and PRN medications administered with no complications.. No acute distress noted. Support and encouragement provided. Patient observed in milieu. No inappropriate behaviors observed or reported. Routine safety checks conducted per facility protocol. Encouraged patient to notify staff if any thoughts of harm towards self or others arise. Patient verbalizes understanding and agreement.

## 2024-04-30 NOTE — ED Notes (Signed)
 Pt is sleeping. No acute distress noted.

## 2024-04-30 NOTE — ED Notes (Signed)
 Patient is in the bedroom sleeping NAD. Will continue to monitor for safety.

## 2024-05-01 DIAGNOSIS — F332 Major depressive disorder, recurrent severe without psychotic features: Secondary | ICD-10-CM | POA: Diagnosis not present

## 2024-05-01 DIAGNOSIS — F1920 Other psychoactive substance dependence, uncomplicated: Secondary | ICD-10-CM | POA: Diagnosis not present

## 2024-05-01 LAB — COMPREHENSIVE METABOLIC PANEL WITH GFR
ALT: 26 U/L (ref 0–44)
AST: 36 U/L (ref 15–41)
Albumin: 2.5 g/dL — ABNORMAL LOW (ref 3.5–5.0)
Alkaline Phosphatase: 47 U/L (ref 38–126)
Anion gap: 7 (ref 5–15)
BUN: 22 mg/dL — ABNORMAL HIGH (ref 6–20)
CO2: 26 mmol/L (ref 22–32)
Calcium: 8.7 mg/dL — ABNORMAL LOW (ref 8.9–10.3)
Chloride: 103 mmol/L (ref 98–111)
Creatinine, Ser: 1.16 mg/dL — ABNORMAL HIGH (ref 0.44–1.00)
GFR, Estimated: 57 mL/min — ABNORMAL LOW (ref >60)
Glucose, Bld: 117 mg/dL — ABNORMAL HIGH (ref 70–99)
Potassium: 5.3 mmol/L — ABNORMAL HIGH (ref 3.5–5.1)
Sodium: 136 mmol/L (ref 135–145)
Total Bilirubin: 0.4 mg/dL (ref 0.0–1.2)
Total Protein: 5.7 g/dL — ABNORMAL LOW (ref 6.5–8.1)

## 2024-05-01 NOTE — ED Notes (Signed)
 Patient A&Ox4. Denies intent to harm self/others when asked. Denies A/VH. Patient denies any physical complaints when asked. No acute distress noted. Support and encouragement provided. Routine safety checks conducted according to facility protocol. Encouraged patient to notify staff if thoughts of harm toward self or others arise. Patient verbalize understanding and agreement. Will continue to monitor for safety.

## 2024-05-01 NOTE — Group Note (Signed)
 Group Topic: Overcoming Obstacles  Group Date: 05/01/2024 Start Time: 0815 End Time: 9152 Facilitators: Lonzell Dwayne RAMAN, NT  Department: [facility]  Number of Participants: 2  Group Focus: relapse prevention Treatment Modality:  Psychoeducation Interventions utilized were clarification Purpose: enhance coping skills  Name: Judy Robles Date of Birth: [birth year]  MR: [id number]    Level of Participation: Patient did attend groupminimal Quality of Participation: passive Interactions with others: gave feedback Mood/Affect: flat Triggers (if applicable): N/A Cognition: no insight Progress: Gaining insight Response: Appropriate  Plan: follow-up needed  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   HCAP (healthcare-associated pneumonia) 09/16/2018   LLL pneumonia 09/16/2018   Sepsis (HCC) 09/16/2018   Hyponatremia 09/16/2018   Hypokalemia 09/16/2018   Severe benzodiazepine use disorder (HCC) 07/10/2018   Bipolar II disorder (HCC)    PTSD (post-traumatic stress disorder) 11/21/2017   Community acquired pneumonia of right lower lobe of lung 09/02/2017   Sepsis associated hypotension (HCC) 09/02/2017   AKI (acute kidney injury) (HCC) 09/02/2017   Nausea and vomiting 09/02/2017   Drug overdose 04/22/2017   Chest pain 10/09/2016   Pressure injury of skin 10/05/2016   Polysubstance dependence including opioid type drug with complication, continuous use (HCC) 07/21/2016   Chronic pain 07/03/2015   Diarrhea 05/01/2014   Primary hypertension 04/17/2014

## 2024-05-01 NOTE — Group Note (Signed)
 Group Topic: Recovery Basics  Group Date: 05/01/2024 Start Time: 1700 End Time: 1730 Facilitators: Herold Lajuana NOVAK, RN  Department: [facility]  Number of Participants: 6  Group Focus: coping skills Treatment Modality:  Leisure Development Interventions utilized were leisure development Purpose: relapse prevention strategies  Name: Judy Robles Date of Birth: 1976/03/31  MR: [id number]    Level of Participation: minimal Quality of Participation: cooperative Interactions with others: gave feedback Mood/Affect: flat Triggers (if applicable): none identified Cognition: processing slowly Progress: None Response: I don't know. I have to think on what to do if I have cravings. 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   HCAP (healthcare-associated pneumonia) 09/16/2018   LLL pneumonia 09/16/2018   Sepsis (HCC) 09/16/2018   Hyponatremia 09/16/2018   Hypokalemia 09/16/2018   Severe benzodiazepine use disorder (HCC) 07/10/2018   Bipolar II disorder (HCC)    PTSD (post-traumatic stress disorder) 11/21/2017   Community acquired pneumonia of right lower lobe of lung 09/02/2017   Sepsis associated hypotension (HCC) 09/02/2017   AKI (acute kidney injury) (HCC) 09/02/2017   Nausea and vomiting 09/02/2017   Drug overdose 04/22/2017   Chest pain 10/09/2016   Pressure injury of skin 10/05/2016   Polysubstance dependence including opioid type drug with  complication, continuous use (HCC) 07/21/2016   Chronic pain 07/03/2015   Diarrhea 05/01/2014   Primary hypertension 04/17/2014

## 2024-05-01 NOTE — ED Notes (Signed)
 Patient is in the bedroom calm and composed. NAD. Will continue to monitor for safety.

## 2024-05-01 NOTE — ED Provider Notes (Signed)
 Behavioral Health Progress Note  Date and Time: 05/01/2024 12:10 PM Name: Judy Robles MRN:  [id number]  HPI: Judy Robles is a 48 y.o. female patient with a documented psychiatric history significant for polysubstance abuse (opioid use, cocaine use, methamphetamine use, benzodiazepine, alcohol use), MDD, bipolar 2, and PTSD and a medical history of drug overdose, hypertension and restless leg syndrome who presented 04/27/24 to the [facility] voluntary accompanied by law enforcement with complaints of suicidal thoughts with a plan, worsening depression and, anxiety and drug abuse. Donavan, Kandi BROCKS, MD, Psychiatry, Date of Service: 04/29/2024  5:57 PM).  Patient assessment: Patient reports today that she is feeling much better, as compared to yesterday.  Reports that the adjustments in the medications yesterday was helpful.  She denies SI, denies HI, denies AVH, denies paranoia, denies delusional thinking, there are no overt signs of psychosis.  Patient denies first rank symptoms, denies medication related side effects.No TD/EPS type symptoms found on assessment, and pt denies any feelings of stiffness. AIMS: 0.  Patient reports inability to focus which is driving me crazy, reports that she has racing thoughts, states my mind is all over the place.  Reports that her restless legs is not quite as bad, with the increase in the dose of her gabapentin  to 400 mg 3 times daily.  Reports that the Robaxin  at 750 mg 3 times daily as needed made a difference as the generalized body pain has also improved significantly.  We discussed discharge planning, to which patient was receptive.  Patient educated that she was no longer willing to go to a long-term rehabilitation for substance abuse.  She was more receptive to an outpatient intensive substance abuse treatment program.  CSW was made aware, and patient is agreeable to a discharge date of 9/11, so as to allow her today to  make some phone calls to find a place where she will be residing after discharge.  Writer educated patient on the plan for follow-up for mental health services after discharge, including the open access clinic here at the [facility], second floor, and the fact that she needs to present here at approximately 6:45 AM any day of the week, within the week to establish care for those services.  Patient receptive, and agreeable to this plan for  medication management and therapy services. We will therefore plan to discharge patient tomorrow, 09/11. No medication adjustments completed today.  Patient completes clonidine  taper tomorrow 9/11.  Labs Reviewed: Hyponatremia has improved, sodium is currently within normal levels.  Free T3 is still pending.   Diagnosis:  Final diagnoses:  Polysubstance (including opioids) dependence with physiological dependence (HCC)  Substance induced mood disorder (HCC)  Severe episode of recurrent major depressive disorder, without psychotic features (HCC)   Total Time spent with patient: 45 minutes  Past Psychiatric History: see H & P Past Medical History: see H & P Family History: see H & P Family Psychiatric  History: see H & P Social History: see H & P  Sleep: Fair  Appetite:  Fair  Current Medications:  Current Facility-Administered Medications  Medication Dose Route Frequency Provider Last Rate Last Admin   acetaminophen  (TYLENOL ) tablet 650 mg  650 mg Oral Q6H PRN White, Patrice L, NP   650 mg at 04/30/24 0826   alum & mag hydroxide-simeth (MAALOX/MYLANTA) 200-200-20 MG/5ML suspension 30 mL  30 mL Oral Q4H PRN White, Patrice L, NP       cloNIDine  (  CATAPRES ) tablet 0.1 mg  0.1 mg Oral BH-qamhs White, Patrice L, NP   0.1 mg at 05/01/24 0900   Followed by   NOREEN ON 05/02/2024] cloNIDine  (CATAPRES ) tablet 0.1 mg  0.1 mg Oral QAC breakfast White, Patrice L, NP       dicyclomine  (BENTYL ) tablet 20 mg  20 mg Oral Q6H PRN White,  Patrice L, NP       haloperidol  (HALDOL ) tablet 5 mg  5 mg Oral TID PRN White, Patrice L, NP       And   diphenhydrAMINE  (BENADRYL ) capsule 50 mg  50 mg Oral TID PRN White, Patrice L, NP       haloperidol  lactate (HALDOL ) injection 5 mg  5 mg Intramuscular TID PRN White, Patrice L, NP       And   diphenhydrAMINE  (BENADRYL ) injection 50 mg  50 mg Intramuscular TID PRN White, Patrice L, NP       And   LORazepam  (ATIVAN ) injection 2 mg  2 mg Intramuscular TID PRN White, Patrice L, NP       haloperidol  lactate (HALDOL ) injection 10 mg  10 mg Intramuscular TID PRN White, Patrice L, NP       And   diphenhydrAMINE  (BENADRYL ) injection 50 mg  50 mg Intramuscular TID PRN White, Patrice L, NP       And   LORazepam  (ATIVAN ) injection 2 mg  2 mg Intramuscular TID PRN White, Patrice L, NP       gabapentin  (NEURONTIN ) capsule 400 mg  400 mg Oral TID Tex Drilling, NP   400 mg at 05/01/24 0913   hydrOXYzine  (ATARAX ) tablet 50 mg  50 mg Oral TID PRN Tex Drilling, NP   50 mg at 04/30/24 2104   loperamide  (IMODIUM ) capsule 2-4 mg  2-4 mg Oral PRN White, Patrice L, NP       magnesium  hydroxide (MILK OF MAGNESIA) suspension 30 mL  30 mL Oral Daily PRN White, Patrice L, NP       magnesium  oxide (MAG-OX) tablet 400 mg  400 mg Oral QHS Hill, Corean Massa, MD   400 mg at 04/30/24 2102   methocarbamol  (ROBAXIN ) tablet 750 mg  750 mg Oral Q8H PRN Tex Drilling, NP   750 mg at 04/30/24 2105   naproxen  (NAPROSYN ) tablet 500 mg  500 mg Oral BID PRN Teresa Jes L, NP   500 mg at 04/28/24 2147   nicotine  (NICODERM CQ  - dosed in mg/24 hours) patch 14 mg  14 mg Transdermal Daily Hill, Corean Massa, MD   14 mg at 05/01/24 0915   nicotine  polacrilex (NICORETTE ) gum 2 mg  2 mg Oral PRN Leigh Corean Massa, MD       ondansetron  (ZOFRAN -ODT) disintegrating tablet 4 mg  4 mg Oral Q6H PRN White, Patrice L, NP       traZODone  (DESYREL ) tablet 100 mg  100 mg Oral QHS PRN Tex Drilling, NP   100 mg at 04/30/24 2103    Vitamin D  (Ergocalciferol ) (DRISDOL ) 1.25 MG (50000 UNIT) capsule 50,000 Units  50,000 Units Oral Q7 days Leigh Corean Massa, MD   50,000 Units at 04/28/24 1504   No current outpatient medications on file.    Labs  Lab Results:  Admission on 04/27/2024  Component Date Value Ref Range Status   Free T4 04/27/2024 0.91  0.61 - 1.12 ng/dL Final   Comment: (NOTE) Biotin ingestion may interfere with free T4 tests. If the results are inconsistent with the TSH level,  previous test results, or the clinical presentation, then consider biotin interference. If needed, order repeat testing after stopping biotin. Performed at [facility], [address], [place], [place] [address]    Sodium 04/30/2024 136  135 - 145 mmol/L Final   Potassium 04/30/2024 5.3 (H)  3.5 - 5.1 mmol/L Final   Chloride 04/30/2024 103  98 - 111 mmol/L Final   CO2 04/30/2024 26  22 - 32 mmol/L Final   Glucose, Bld 04/30/2024 117 (H)  70 - 99 mg/dL Final   Glucose reference range applies only to samples taken after fasting for at least 8 hours.   BUN 04/30/2024 22 (H)  6 - 20 mg/dL Final   Creatinine, Ser 04/30/2024 1.16 (H)  0.44 - 1.00 mg/dL Final   Calcium  04/30/2024 8.7 (L)  8.9 - 10.3 mg/dL Final   Total Protein 90/90/7974 5.7 (L)  6.5 - 8.1 g/dL Final   Albumin 90/90/7974 2.5 (L)  3.5 - 5.0 g/dL Final   AST 90/90/7974 36  15 - 41 U/L Final   ALT 04/30/2024 26  0 - 44 U/L Final   Alkaline Phosphatase 04/30/2024 47  38 - 126 U/L Final   Total Bilirubin 04/30/2024 0.4  0.0 - 1.2 mg/dL Final   GFR, Estimated 04/30/2024 57 (L)  >60 mL/min Final   Comment: (NOTE) Calculated using the CKD-EPI Creatinine Equation (2021)    Anion gap 04/30/2024 7  5 - 15 Final   Performed at [facility], [address] [place], [place] [address]  Admission on 04/27/2024, Discharged on 04/27/2024  Component Date Value Ref Range Status   WBC 04/27/2024 10.4  4.0 - 10.5 K/uL Final   RBC 04/27/2024 3.80 (L)  3.87 - 5.11  MIL/uL Final   Hemoglobin 04/27/2024 12.1  12.0 - 15.0 g/dL Final   HCT 90/93/7974 35.8 (L)  36.0 - 46.0 % Final   MCV 04/27/2024 94.2  80.0 - 100.0 fL Final   MCH 04/27/2024 31.8  26.0 - 34.0 pg Final   MCHC 04/27/2024 33.8  30.0 - 36.0 g/dL Final   RDW 90/93/7974 13.1  11.5 - 15.5 % Final   Platelets 04/27/2024 213  150 - 400 K/uL Final   nRBC 04/27/2024 0.0  0.0 - 0.2 % Final   Neutrophils Relative % 04/27/2024 62  % Final   Neutro Abs 04/27/2024 6.5  1.7 - 7.7 K/uL Final   Lymphocytes Relative 04/27/2024 28  % Final   Lymphs Abs 04/27/2024 2.9  0.7 - 4.0 K/uL Final   Monocytes Relative 04/27/2024 8  % Final   Monocytes Absolute 04/27/2024 0.8  0.1 - 1.0 K/uL Final   Eosinophils Relative 04/27/2024 1  % Final   Eosinophils Absolute 04/27/2024 0.1  0.0 - 0.5 K/uL Final   Basophils Relative 04/27/2024 1  % Final   Basophils Absolute 04/27/2024 0.1  0.0 - 0.1 K/uL Final   Immature Granulocytes 04/27/2024 0  % Final   Abs Immature Granulocytes 04/27/2024 0.04  0.00 - 0.07 K/uL Final   Performed at [facility], [address] [place], [place] [address]   Sodium 04/27/2024 133 (L)  135 - 145 mmol/L Final   Potassium 04/27/2024 3.5  3.5 - 5.1 mmol/L Final   Chloride 04/27/2024 101  98 - 111 mmol/L Final   CO2 04/27/2024 18 (L)  22 - 32 mmol/L Final   Glucose, Bld 04/27/2024 94  70 - 99 mg/dL Final   Glucose reference range applies only  to samples taken after fasting for at least 8 hours.   BUN 04/27/2024 19  6 - 20 mg/dL Final   Creatinine, Ser 04/27/2024 1.36 (H)  0.44 - 1.00 mg/dL Final   Calcium  04/27/2024 8.7 (L)  8.9 - 10.3 mg/dL Final   Total Protein 90/93/7974 7.5  6.5 - 8.1 g/dL Final   Albumin 90/93/7974 3.6  3.5 - 5.0 g/dL Final   AST 90/93/7974 70 (H)  15 - 41 U/L Final   ALT 04/27/2024 39  0 - 44 U/L Final   Alkaline Phosphatase 04/27/2024 59  38 - 126 U/L Final   Total Bilirubin 04/27/2024 0.8  0.0 - 1.2 mg/dL Final   GFR, Estimated 04/27/2024 47 (L)  >60 mL/min  Final   Comment: (NOTE) Calculated using the CKD-EPI Creatinine Equation (2021)    Anion gap 04/27/2024 14  5 - 15 Final   Performed at [facility], [address], [place], [place] [address]   Hgb A1c MFr Bld 04/27/2024 4.8  4.8 - 5.6 % Final   Comment: (NOTE) Diagnosis of Diabetes The following HbA1c ranges recommended by the American Diabetes Association (ADA) may be used as an aid in the diagnosis of diabetes mellitus.  Hemoglobin             Suggested A1C NGSP%              Diagnosis  <5.7                   Non Diabetic  5.7-6.4                Pre-Diabetic  >6.4                   Diabetic  <7.0                   Glycemic control for                       adults with diabetes.     Mean Plasma Glucose 04/27/2024 91.06  mg/dL Final   Performed at [facility], [address] [place], [place] [address]   Alcohol, Ethyl (B) 04/27/2024 <15  <15 mg/dL Final   Comment: (NOTE) For medical purposes only. Performed at [facility], [address] [place], [place] [address]    Cholesterol 04/27/2024 142  0 - 200 mg/dL Final   Triglycerides 90/93/7974 59  <150 mg/dL Final   HDL 90/93/7974 53  >40 mg/dL Final   Total CHOL/HDL Ratio 04/27/2024 2.7  RATIO Final   VLDL 04/27/2024 12  0 - 40 mg/dL Final   LDL Cholesterol 04/27/2024 77  0 - 99 mg/dL Final   Comment:        Total Cholesterol/HDL:CHD Risk Coronary Heart Disease Risk Table                     Men   Women  1/2 Average Risk   3.4   3.3  Average Risk       5.0   4.4  2 X Average Risk   9.6   7.1  3 X Average Risk  23.4   11.0        Use the calculated Patient Ratio above and the CHD Risk Table to determine the patient's CHD Risk.        ATP III CLASSIFICATION (LDL):  <100     mg/dL  Optimal  100-129  mg/dL   Near or Above                    Optimal  130-159  mg/dL   Borderline  839-810  mg/dL   High  >809     mg/dL   Very High Performed at [facility], [address],  [place], [place] [address]    TSH 04/27/2024 5.052 (H)  0.350 - 4.500 uIU/mL Final   Comment: Performed by a 3rd Generation assay with a functional sensitivity of <=0.01 uIU/mL. Performed at [facility], [address] [place], [place] [address]    RPR Ser Ql 04/27/2024 NON REACTIVE  NON REACTIVE Final   Performed at [facility], [address], [place], [place] [address]   POC Amphetamine  UR 04/27/2024 None Detected  NONE DETECTED (Cut Off Level 1000 ng/mL) Final   POC Secobarbital (BAR) 04/27/2024 None Detected  NONE DETECTED (Cut Off Level 300 ng/mL) Final   POC Buprenorphine  (BUP) 04/27/2024 Positive (A)  NONE DETECTED (Cut Off Level 10 ng/mL) Final   POC Oxazepam (BZO) 04/27/2024 None Detected  NONE DETECTED (Cut Off Level 300 ng/mL) Final   POC Cocaine UR 04/27/2024 None Detected  NONE DETECTED (Cut Off Level 300 ng/mL) Final   POC Methamphetamine UR 04/27/2024 Positive (A)  NONE DETECTED (Cut Off Level 1000 ng/mL) Final   POC Morphine  04/27/2024 None Detected  NONE DETECTED (Cut Off Level 300 ng/mL) Final   POC Methadone UR 04/27/2024 None Detected  NONE DETECTED (Cut Off Level 300 ng/mL) Final   POC Oxycodone  UR 04/27/2024 Positive (A)  NONE DETECTED (Cut Off Level 100 ng/mL) Final   POC Marijuana UR 04/27/2024 None Detected  NONE DETECTED (Cut Off Level 50 ng/mL) Final   Vit D, 25-Hydroxy 04/27/2024 27.64 (L)  30 - 100 ng/mL Final   Comment: (NOTE) Vitamin D  deficiency has been defined by the Institute of Medicine  and an Endocrine Society practice guideline as a level of serum 25-OH  vitamin D  less than 20 ng/mL (1,2). The Endocrine Society went on to  further define vitamin D  insufficiency as a level between 21 and 29  ng/mL (2).  1. IOM (Institute of Medicine). 2010. Dietary reference intakes for  calcium  and D. [place]: The [employer]. 2. Holick MF, Binkley [place], Bischoff-Ferrari HA, et al. Evaluation,  treatment, and prevention of vitamin D   deficiency: an Endocrine  Society clinical practice guideline, JCEM. 2011 Jul; 96(7): 1911-30.  Performed at [facility], [address], [place], [place] [address]    Vitamin B-12 04/27/2024 369  180 - 914 pg/mL Final   Comment: (NOTE) This assay is not validated for testing neonatal or myeloproliferative syndrome specimens for Vitamin B12 levels. Performed at [facility], [address], [place], [place] [address]    Magnesium  04/27/2024 1.6 (L)  1.7 - 2.4 mg/dL Final   Performed at [facility], [address], [place], [place] [address]   HIV Screen 4th Generation wRfx 04/27/2024 Non Reactive  Non Reactive Final   Performed at [facility], [address] [place], [place] [address]  Admission on 01/06/2024, Discharged on 01/08/2024  Component Date Value Ref Range Status   Sodium 01/06/2024 133 (L)  135 - 145 mmol/L Final   Potassium 01/06/2024 4.4  3.5 - 5.1 mmol/L Final   Chloride 01/06/2024 103  98 - 111 mmol/L Final   CO2  01/06/2024 22  22 - 32 mmol/L Final   Glucose, Bld 01/06/2024 94  70 - 99 mg/dL Final   Glucose reference range applies only to samples taken after fasting for at least 8 hours.   BUN 01/06/2024 26 (H)  6 - 20 mg/dL Final   Creatinine, Ser 01/06/2024 1.17 (H)  0.44 - 1.00 mg/dL Final   Calcium  01/06/2024 8.6 (L)  8.9 - 10.3 mg/dL Final   Total Protein 94/82/7974 7.4  6.5 - 8.1 g/dL Final   Albumin 94/82/7974 3.3 (L)  3.5 - 5.0 g/dL Final   AST 94/82/7974 24  15 - 41 U/L Final   ALT 01/06/2024 19  0 - 44 U/L Final   Alkaline Phosphatase 01/06/2024 72  38 - 126 U/L Final   Total Bilirubin 01/06/2024 0.6  0.0 - 1.2 mg/dL Final   GFR, Estimated 01/06/2024 57 (L)  >60 mL/min Final   Comment: (NOTE) Calculated using the CKD-EPI Creatinine Equation (2021)    Anion gap 01/06/2024 8  5 - 15 Final   Performed at [facility], [address], [place], [place] [address]   Alcohol, Ethyl (B) 01/06/2024 <15  <15 mg/dL  Final   Comment: Please note change in reference range. (NOTE) For medical purposes only. Performed at [facility], [address] [place], [place] [address]    WBC 01/06/2024 7.1  4.0 - 10.5 K/uL Final   RBC 01/06/2024 3.90  3.87 - 5.11 MIL/uL Final   Hemoglobin 01/06/2024 12.4  12.0 - 15.0 g/dL Final   HCT 94/82/7974 38.7  36.0 - 46.0 % Final   MCV 01/06/2024 99.2  80.0 - 100.0 fL Final   MCH 01/06/2024 31.8  26.0 - 34.0 pg Final   MCHC 01/06/2024 32.0  30.0 - 36.0 g/dL Final   RDW 94/82/7974 12.9  11.5 - 15.5 % Final   Platelets 01/06/2024 249  150 - 400 K/uL Final   nRBC 01/06/2024 0.0  0.0 - 0.2 % Final   Performed at [facility], [address], [place], [place] [address]   Opiates 01/06/2024 NONE DETECTED  NONE DETECTED Final   Cocaine 01/06/2024 POSITIVE (A)  NONE DETECTED Final   Benzodiazepines 01/06/2024 POSITIVE (A)  NONE DETECTED Final   Amphetamines 01/06/2024 POSITIVE (A)  NONE DETECTED Final   Comment: (NOTE) Trazodone  is metabolized in vivo to several metabolites, including pharmacologically active m-CPP, which is excreted in the urine. Immunoassay screens for amphetamines and MDMA have potential cross-reactivity with these compounds and may provide false positive  results.     Tetrahydrocannabinol 01/06/2024 POSITIVE (A)  NONE DETECTED Final   Barbiturates 01/06/2024 NONE DETECTED  NONE DETECTED Final   Comment: (NOTE) DRUG SCREEN FOR MEDICAL PURPOSES ONLY.  IF CONFIRMATION IS NEEDED FOR ANY PURPOSE, NOTIFY LAB WITHIN 5 DAYS.  LOWEST DETECTABLE LIMITS FOR URINE DRUG SCREEN Drug Class                     Cutoff (ng/mL) Amphetamine  and metabolites    1000 Barbiturate and metabolites    200 Benzodiazepine                 200 Opiates and metabolites        300 Cocaine and metabolites        300 THC                            50 Performed  at [facility], [address] [place], [place]  [address]    Preg, Serum 01/06/2024 NEGATIVE  NEGATIVE Final   Comment:        THE SENSITIVITY OF THIS METHODOLOGY IS >10 mIU/mL. Performed at [facility], [address] [place], [place] [address]   Admission on 01/02/2024, Discharged on 01/02/2024  Component Date Value Ref Range Status   SARS Coronavirus 2 by RT PCR 01/02/2024 NEGATIVE  NEGATIVE Final   Influenza A by PCR 01/02/2024 NEGATIVE  NEGATIVE Final   Influenza B by PCR 01/02/2024 NEGATIVE  NEGATIVE Final   Comment: (NOTE) The Xpert Xpress SARS-CoV-2/FLU/RSV plus assay is intended as an aid in the diagnosis of influenza from Nasopharyngeal swab specimens and should not be used as a sole basis for treatment. Nasal washings and aspirates are unacceptable for Xpert Xpress SARS-CoV-2/FLU/RSV testing.  Fact Sheet for Patients: BloggerCourse.com  Fact Sheet for Healthcare Providers: SeriousBroker.it  This test is not yet approved or cleared by the [place]  FDA and has been authorized for detection and/or diagnosis of SARS-CoV-2 by FDA under an Emergency Use Authorization (EUA). This EUA will remain in effect (meaning this test can be used) for the duration of the COVID-19 declaration under Section 564(b)(1) of the Act, 21 U.S.C. section 360bbb-3(b)(1), unless the authorization is terminated or revoked.     Resp Syncytial Virus by PCR 01/02/2024 NEGATIVE  NEGATIVE Final   Comment: (NOTE) Fact Sheet for Patients: BloggerCourse.com  Fact Sheet for Healthcare Providers: SeriousBroker.it  This test is not yet approved or cleared by the [place]  FDA and has been authorized for detection and/or diagnosis of SARS-CoV-2 by FDA under an Emergency Use Authorization (EUA). This EUA will remain in effect (meaning this test can be used) for the duration of the COVID-19 declaration under Section 564(b)(1) of  the Act, 21 U.S.C. section 360bbb-3(b)(1), unless the authorization is terminated or revoked.  Performed at [facility], [address] [place], [place] [address]    WBC 01/02/2024 7.9  4.0 - 10.5 K/uL Final   RBC 01/02/2024 4.18  3.87 - 5.11 MIL/uL Final   Hemoglobin 01/02/2024 13.2  12.0 - 15.0 g/dL Final   HCT 94/86/7974 40.9  36.0 - 46.0 % Final   MCV 01/02/2024 97.8  80.0 - 100.0 fL Final   MCH 01/02/2024 31.6  26.0 - 34.0 pg Final   MCHC 01/02/2024 32.3  30.0 - 36.0 g/dL Final   RDW 94/86/7974 12.9  11.5 - 15.5 % Final   Platelets 01/02/2024 230  150 - 400 K/uL Final   nRBC 01/02/2024 0.0  0.0 - 0.2 % Final   Neutrophils Relative % 01/02/2024 63  % Final   Neutro Abs 01/02/2024 5.0  1.7 - 7.7 K/uL Final   Lymphocytes Relative 01/02/2024 26  % Final   Lymphs Abs 01/02/2024 2.1  0.7 - 4.0 K/uL Final   Monocytes Relative 01/02/2024 9  % Final   Monocytes Absolute 01/02/2024 0.7  0.1 - 1.0 K/uL Final   Eosinophils Relative 01/02/2024 1  % Final   Eosinophils Absolute 01/02/2024 0.1  0.0 - 0.5 K/uL Final   Basophils Relative 01/02/2024 1  % Final   Basophils Absolute 01/02/2024 0.1  0.0 - 0.1 K/uL Final   Immature Granulocytes 01/02/2024 0  % Final   Abs Immature Granulocytes 01/02/2024 0.02  0.00 - 0.07 K/uL Final   Performed at [facility], [address] [place], [place] [address]  Sodium 01/02/2024 138  135 - 145 mmol/L Final   Potassium 01/02/2024 3.7  3.5 - 5.1 mmol/L Final   Chloride 01/02/2024 105  98 - 111 mmol/L Final   CO2 01/02/2024 22  22 - 32 mmol/L Final   Glucose, Bld 01/02/2024 96  70 - 99 mg/dL Final   Glucose reference range applies only to samples taken after fasting for at least 8 hours.   BUN 01/02/2024 10  6 - 20 mg/dL Final   Creatinine, Ser 01/02/2024 0.74  0.44 - 1.00 mg/dL Final   Calcium  01/02/2024 9.3  8.9 - 10.3 mg/dL Final   Total Protein 94/86/7974 7.7  6.5 - 8.1 g/dL Final   Albumin 94/86/7974 3.1 (L)  3.5 - 5.0 g/dL Final    AST 94/86/7974 26  15 - 41 U/L Final   ALT 01/02/2024 22  0 - 44 U/L Final   Alkaline Phosphatase 01/02/2024 66  38 - 126 U/L Final   Total Bilirubin 01/02/2024 0.5  0.0 - 1.2 mg/dL Final   GFR, Estimated 01/02/2024 >60  >60 mL/min Final   Comment: (NOTE) Calculated using the CKD-EPI Creatinine Equation (2021)    Anion gap 01/02/2024 11  5 - 15 Final   Performed at [facility], [address], [place], [place] [address]  Admission on 12/14/2023, Discharged on 12/17/2023  Component Date Value Ref Range Status   WBC 12/15/2023 4.4  4.0 - 10.5 K/uL Final   RBC 12/15/2023 3.54 (L)  3.87 - 5.11 MIL/uL Final   Hemoglobin 12/15/2023 11.3 (L)  12.0 - 15.0 g/dL Final   HCT 95/74/7974 34.7 (L)  36.0 - 46.0 % Final   MCV 12/15/2023 98.0  80.0 - 100.0 fL Final   MCH 12/15/2023 31.9  26.0 - 34.0 pg Final   MCHC 12/15/2023 32.6  30.0 - 36.0 g/dL Final   RDW 95/74/7974 14.2  11.5 - 15.5 % Final   Platelets 12/15/2023 150  150 - 400 K/uL Final   nRBC 12/15/2023 0.0  0.0 - 0.2 % Final   Neutrophils Relative % 12/15/2023 47  % Final   Neutro Abs 12/15/2023 2.1  1.7 - 7.7 K/uL Final   Lymphocytes Relative 12/15/2023 41  % Final   Lymphs Abs 12/15/2023 1.8  0.7 - 4.0 K/uL Final   Monocytes Relative 12/15/2023 8  % Final   Monocytes Absolute 12/15/2023 0.4  0.1 - 1.0 K/uL Final   Eosinophils Relative 12/15/2023 3  % Final   Eosinophils Absolute 12/15/2023 0.1  0.0 - 0.5 K/uL Final   Basophils Relative 12/15/2023 1  % Final   Basophils Absolute 12/15/2023 0.0  0.0 - 0.1 K/uL Final   Immature Granulocytes 12/15/2023 0  % Final   Abs Immature Granulocytes 12/15/2023 0.01  0.00 - 0.07 K/uL Final   Performed at [facility], [address], [place], [place] [address]   Sodium 12/15/2023 136  135 - 145 mmol/L Final   Potassium 12/15/2023 4.2  3.5 - 5.1 mmol/L Final   Chloride 12/15/2023 106  98 - 111 mmol/L Final   CO2 12/15/2023 21 (L)  22 - 32 mmol/L Final   Glucose, Bld 12/15/2023 77  70  - 99 mg/dL Final   Glucose reference range applies only to samples taken after fasting for at least 8 hours.   BUN 12/15/2023 23 (H)  6 - 20 mg/dL Final   Creatinine, Ser 12/15/2023 0.72  0.44 - 1.00 mg/dL Final   Calcium  12/15/2023 8.2 (L)  8.9 - 10.3 mg/dL Final   Total Protein 95/74/7974 5.8 (L)  6.5 - 8.1 g/dL Final   Albumin 95/74/7974 2.5 (L)  3.5 - 5.0 g/dL Final   AST 95/74/7974 40  15 - 41 U/L Final   ALT 12/15/2023 36  0 - 44 U/L Final   Alkaline Phosphatase 12/15/2023 40  38 - 126 U/L Final   Total Bilirubin 12/15/2023 0.5  0.0 - 1.2 mg/dL Final   GFR, Estimated 12/15/2023 >60  >60 mL/min Final   Comment: (NOTE) Calculated using the CKD-EPI Creatinine Equation (2021)    Anion gap 12/15/2023 9  5 - 15 Final   Performed at [facility], [address] [place], [place] [address]   Hgb A1c MFr Bld 12/15/2023 4.9  4.8 - 5.6 % Final   Comment: (NOTE) Pre diabetes:          5.7%-6.4%  Diabetes:              >6.4%  Glycemic control for   <7.0% adults with diabetes    Mean Plasma Glucose 12/15/2023 93.93  mg/dL Final   Performed at [facility], [address], [place], [place] [address]   Alcohol, Ethyl (B) 12/15/2023 <15  <15 mg/dL Final   Comment: Please note change in reference range. (NOTE) For medical purposes only. Performed at [facility], [address], [place], [place] [address]    RPR Ser Ql 12/15/2023 NON REACTIVE  NON REACTIVE Final   Performed at [facility], [address], [place], [place] [address]   HIV Screen 4th Generation wRfx 12/15/2023 Non Reactive  Non Reactive Final   Performed at [facility], [address], [place], [place] [address]   Cholesterol 12/15/2023 133  0 - 200 mg/dL Final   Triglycerides 95/74/7974 34  <150 mg/dL Final   HDL 95/74/7974 57  >40 mg/dL Final   Total CHOL/HDL Ratio 12/15/2023 2.3  RATIO Final   VLDL 12/15/2023 7  0 - 40 mg/dL Final   LDL Cholesterol 12/15/2023 69  0 - 99 mg/dL Final    Comment:        Total Cholesterol/HDL:CHD Risk Coronary Heart Disease Risk Table                     Men   Women  1/2 Average Risk   3.4   3.3  Average Risk       5.0   4.4  2 X Average Risk   9.6   7.1  3 X Average Risk  23.4   11.0        Use the calculated Patient Ratio above and the CHD Risk Table to determine the patient's CHD Risk.        ATP III CLASSIFICATION (LDL):  <100     mg/dL   Optimal  899-870  mg/dL   Near or Above                    Optimal  130-159  mg/dL   Borderline  839-810  mg/dL   High  >809     mg/dL   Very High Performed at [facility], [address], [place], [place] [address]    TSH 12/15/2023 2.653  0.350 - 4.500 uIU/mL Final   Comment: Performed by a 3rd Generation assay with a functional sensitivity of <=0.01 uIU/mL. Performed at [facility], [address],  [place], [place] [address]   Admission on 12/14/2023, Discharged on 12/14/2023  Component Date Value Ref Range Status   Neisseria Gonorrhea 12/14/2023 Negative   Final   Chlamydia 12/14/2023 Negative   Final   Comment 12/14/2023 Normal Reference Ranger Chlamydia - Negative   Final   Comment 12/14/2023 Normal Reference Range Neisseria Gonorrhea - Negative   Final   Preg Test, Ur 12/14/2023 Negative  Negative Final   POC Amphetamine  UR 12/14/2023 None Detected  NONE DETECTED (Cut Off Level 1000 ng/mL) Final   POC Secobarbital (BAR) 12/14/2023 None Detected  NONE DETECTED (Cut Off Level 300 ng/mL) Final   POC Buprenorphine  (BUP) 12/14/2023 None Detected  NONE DETECTED (Cut Off Level 10 ng/mL) Final   POC Oxazepam (BZO) 12/14/2023 None Detected  NONE DETECTED (Cut Off Level 300 ng/mL) Final   POC Cocaine UR 12/14/2023 Positive (A)  NONE DETECTED (Cut Off Level 300 ng/mL) Final   POC Methamphetamine UR 12/14/2023 None Detected  NONE DETECTED (Cut Off Level 1000 ng/mL) Final   POC Morphine  12/14/2023 None Detected  NONE DETECTED (Cut Off Level 300 ng/mL) Final   POC Methadone UR  12/14/2023 None Detected  NONE DETECTED (Cut Off Level 300 ng/mL) Final   POC Oxycodone  UR 12/14/2023 Positive (A)  NONE DETECTED (Cut Off Level 100 ng/mL) Final   POC Marijuana UR 12/14/2023 Positive (A)  NONE DETECTED (Cut Off Level 50 ng/mL) Final  Admission on 11/20/2023, Discharged on 11/20/2023  Component Date Value Ref Range Status   Sodium 11/20/2023 138  135 - 145 mmol/L Final   Potassium 11/20/2023 3.5  3.5 - 5.1 mmol/L Final   Chloride 11/20/2023 102  98 - 111 mmol/L Final   CO2 11/20/2023 25  22 - 32 mmol/L Final   Glucose, Bld 11/20/2023 89  70 - 99 mg/dL Final   Glucose reference range applies only to samples taken after fasting for at least 8 hours.   BUN 11/20/2023 9  6 - 20 mg/dL Final   Creatinine, Ser 11/20/2023 0.87  0.44 - 1.00 mg/dL Final   Calcium  11/20/2023 9.5  8.9 - 10.3 mg/dL Final   Total Protein 96/68/7974 7.7  6.5 - 8.1 g/dL Final   Albumin 96/68/7974 3.5  3.5 - 5.0 g/dL Final   AST 96/68/7974 81 (H)  15 - 41 U/L Final   ALT 11/20/2023 60 (H)  0 - 44 U/L Final   Alkaline Phosphatase 11/20/2023 62  38 - 126 U/L Final   Total Bilirubin 11/20/2023 0.5  0.0 - 1.2 mg/dL Final   GFR, Estimated 11/20/2023 >60  >60 mL/min Final   Comment: (NOTE) Calculated using the CKD-EPI Creatinine Equation (2021)    Anion gap 11/20/2023 11  5 - 15 Final   Performed at [facility], [address], [place], [place] [address]   WBC 11/20/2023 6.2  4.0 - 10.5 K/uL Final   RBC 11/20/2023 4.02  3.87 - 5.11 MIL/uL Final   Hemoglobin 11/20/2023 12.8  12.0 - 15.0 g/dL Final   HCT 96/68/7974 39.0  36.0 - 46.0 % Final   MCV 11/20/2023 97.0  80.0 - 100.0 fL Final   MCH 11/20/2023 31.8  26.0 - 34.0 pg Final   MCHC 11/20/2023 32.8  30.0 - 36.0 g/dL Final   RDW 96/68/7974 14.4  11.5 - 15.5 % Final   Platelets 11/20/2023 164  150 - 400 K/uL Final   nRBC 11/20/2023 0.0  0.0 - 0.2 % Final   Neutrophils Relative % 11/20/2023 53  %  Final   Neutro Abs 11/20/2023 3.3  1.7 - 7.7 K/uL  Final   Lymphocytes Relative 11/20/2023 33  % Final   Lymphs Abs 11/20/2023 2.1  0.7 - 4.0 K/uL Final   Monocytes Relative 11/20/2023 8  % Final   Monocytes Absolute 11/20/2023 0.5  0.1 - 1.0 K/uL Final   Eosinophils Relative 11/20/2023 5  % Final   Eosinophils Absolute 11/20/2023 0.3  0.0 - 0.5 K/uL Final   Basophils Relative 11/20/2023 1  % Final   Basophils Absolute 11/20/2023 0.1  0.0 - 0.1 K/uL Final   Immature Granulocytes 11/20/2023 0  % Final   Abs Immature Granulocytes 11/20/2023 0.02  0.00 - 0.07 K/uL Final   Performed at [facility], [address], [place], [place] [address]   Alcohol, Ethyl (B) 11/20/2023 144 (H)  <10 mg/dL Final   Comment: (NOTE) Lowest detectable limit for serum alcohol is 10 mg/dL.  For medical purposes only. Performed at [facility], [address], [place], [place] [address]    Acetaminophen  (Tylenol ), Serum 11/20/2023 <10 (L)  10 - 30 ug/mL Final   Comment: (NOTE) Therapeutic concentrations vary significantly. A range of 10-30 ug/mL  may be an effective concentration for many patients. However, some  are best treated at concentrations outside of this range. Acetaminophen  concentrations >150 ug/mL at 4 hours after ingestion  and >50 ug/mL at 12 hours after ingestion are often associated with  toxic reactions.  Performed at [facility], [address] [place], [place] [address]    Specimen Source 11/20/2023 URINE, CLEAN CATCH   Final   Color, Urine 11/20/2023 YELLOW  YELLOW Final   APPearance 11/20/2023 HAZY (A)  CLEAR Final   Specific Gravity, Urine 11/20/2023 1.008  1.005 - 1.030 Final   pH 11/20/2023 6.0  5.0 - 8.0 Final   Glucose, UA 11/20/2023 NEGATIVE  NEGATIVE mg/dL Final   Hgb urine dipstick 11/20/2023 SMALL (A)  NEGATIVE Final   Bilirubin Urine 11/20/2023 NEGATIVE  NEGATIVE Final   Ketones, ur 11/20/2023 NEGATIVE  NEGATIVE mg/dL Final   Protein, ur 96/68/7974 NEGATIVE  NEGATIVE mg/dL Final   Nitrite 96/68/7974  NEGATIVE  NEGATIVE Final   Leukocytes,Ua 11/20/2023 NEGATIVE  NEGATIVE Final   RBC / HPF 11/20/2023 0-5  0 - 5 RBC/hpf Final   WBC, UA 11/20/2023 0-5  0 - 5 WBC/hpf Final   Comment:        Reflex urine culture not performed if WBC <=10, OR if Squamous epithelial cells >5. If Squamous epithelial cells >5 suggest recollection.    Bacteria, UA 11/20/2023 RARE (A)  NONE SEEN Final   Squamous Epithelial / HPF 11/20/2023 0-5  0 - 5 /HPF Final   Mucus 11/20/2023 PRESENT   Final   Performed at [facility], [address], [place], [place] [address]   Preg, Serum 11/20/2023 NEGATIVE  NEGATIVE Final   Comment:        THE SENSITIVITY OF THIS METHODOLOGY IS >10 mIU/mL. Performed at [facility], [address], [place], [place] [address]    Blood Alcohol level:  Lab Results  Component Value Date   [facility] <15 04/27/2024   [facility] <15 01/06/2024   Metabolic Disorder Labs: Lab Results  Component Value Date   HGBA1C 4.8 04/27/2024   MPG 91.06 04/27/2024   MPG 93.93 12/15/2023   No results found for: PROLACTIN Lab Results  Component Value Date   CHOL 142 04/27/2024   TRIG  59 04/27/2024   HDL 53 04/27/2024   CHOLHDL 2.7 04/27/2024   VLDL 12 04/27/2024   LDLCALC 77 04/27/2024   LDLCALC 69 12/15/2023    Therapeutic Lab Levels: No results found for: LITHIUM  No results found for: VALPROATE No results found for: CBMZ  Physical Findings   AIMS    Flowsheet Row [facility]) [facility]) [facility] Ad[facility] (Discharged[facility] Ad[facility] (Discharged) [facility] Ad[facility] (Dis[facility]) [facility]  AIMS Total Score 0 0 0 0 0   AUDIT    Flowsheet Row ED from 12/14/2023 in  [facility] ED from [facility]21/2023 in [facility] Ad[facility] (Discharged) [facility] Ad[facility] (Discharged) [facility] Ad[facility] (Discharged) [facility]  Alcohol Use Disorder Identification Test Final Score (AUDIT) 24 19 0 0 1   PHQ2-9    Flowsheet Row ED from 04/27/2024 in [facility] ED from 12/14/2023 in [facility] ED from 06/21/2023 in [facility] Office Visit from 05/01/2014 [facility] [place]  PHQ-2 Total Score 5 4 0 0  PHQ-9 Total Score 24 14 0 --   Flowsheet Row ED from 04/27/2024 in [facility] Most recent reading at 04/27/2024  3:36 PM ED from 04/27/2024 in [facility] Most recent reading at 04/27/2024  1:15 PM ED from 01/06/2024 in [facility] Emergency Department at [facility] Most recent reading at 01/06/2024  9:33 PM  C-SSRS RISK CATEGORY High Risk High Risk No Risk    Musculoskeletal  Strength & Muscle Tone: within normal limits Gait & Station: normal Patient leans: N/A  Psychiatric Specialty Exam  Presentation  General Appearance:  Fairly Groomed  Eye Contact: Fair  Speech: Clear and Coherent  Speech Volume: Normal  Handedness: Right  Mood and Affect  Mood: Depressed; Anxious  Affect: Congruent  Thought Process  Thought Processes: Coherent  Descriptions of Associations:Intact  Orientation:Full (Time, Place and Person)  Thought Content:Logical  Diagnosis of Schizophrenia or Schizoaffective disorder in past: No    Hallucinations:Hallucinations: None  Ideas of Reference:None  Suicidal Thoughts:Suicidal Thoughts: No  Homicidal  Thoughts:Homicidal Thoughts: No   Sensorium  Memory: Immediate Fair  Judgment: Fair  Insight: Fair   Art therapist  Concentration: Fair  Attention Span: Fair  Recall: Fair  Fund of Knowledge: Fair  Language: Fair  Psychomotor Activity  Psychomotor Activity: Psychomotor Activity: Normal  Assets  Assets: Resilience  Sleep  Sleep: Sleep: Fair  Estimated Sleeping Duration (Last 24 Hours): 13.50-16.75 hours  Nutritional Assessment (For OBS and FBC admissions only) Has the patient had a weight loss or gain of 10 pounds or more in the last 3 months?: No Has the patient had a decrease in food intake/or appetite?: No Does the patient have dental problems?: No Does the patient have eating habits or behaviors that may be indicators of an eating disorder including binging or inducing vomiting?: No Has the patient recently lost weight without trying?: 0 Has the patient been eating poorly because of a decreased appetite?: 0 Malnutrition Screening Tool  Score: 0   Physical Exam  Physical Exam Constitutional:      Appearance: Normal appearance.  Musculoskeletal:     Cervical back: Normal range of motion.  Neurological:     General: No focal deficit present.     Mental Status: She is alert and oriented to person, place, and time.    Review of Systems  Psychiatric/Behavioral:  Positive for depression and substance abuse. Negative for hallucinations, memory loss and suicidal ideas. The patient is nervous/anxious and has insomnia.   All other systems reviewed and are negative.  Blood pressure 125/66, pulse (!) 55, temperature 98.5 F (36.9 C), temperature source Oral, resp. rate 18, SpO2 94%. There is no height or weight on file to calculate BMI.  Treatment Plan Summary: Daily contact with patient to assess and evaluate symptoms and progress in treatment and Medication management  Safety and Monitoring: Voluntary admission to inpatient psychiatric unit  for safety, stabilization and treatment Daily contact with patient to assess and evaluate symptoms and progress in treatment Patient's case to be discussed in multi-disciplinary team meeting Observation Level : q15 minute checks Vital signs: q12 hours Precautions: Safety  Long Term Goal(s): Improvement in symptoms so as ready for discharge  Short Term Goals: Ability to identify changes in lifestyle to reduce recurrence of condition will improve, Ability to verbalize feelings will improve, Ability to disclose and discuss suicidal ideas, Ability to demonstrate self-control will improve, Ability to identify and develop effective coping behaviors will improve, Ability to maintain clinical measurements within normal limits will improve, Compliance with prescribed medications will improve, and Ability to identify triggers associated with substance abuse/mental health issues will improve  Diagnoses Principal Problem:   Polysubstance dependence including opioid type drug with complication, continuous use (HCC) Active Problems:   PTSD (post-traumatic stress disorder)   Bipolar II disorder (HCC)   Severe benzodiazepine use disorder (HCC)   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 Please see the MAR for a detailed meds list   PRNS -Continue Tylenol  650 mg every 6 hours PRN for mild pain -Continue Maalox 30 mg every 4 hrs PRN for indigestion -Continue Milk of Magnesia as needed every 6 hrs for constipation  Discharge Planning: Social work and case management to assist with discharge planning and identification of hospital follow-up needs prior to discharge Estimated LOS: 5-7 days Discharge Concerns: Need  to establish a safety plan; Medication compliance and effectiveness Discharge Goals: Return home with outpatient referrals for mental health follow-up including medication management/psychotherapy  Total Time Spent in Direct Patient Care:  I personally spent 45 minutes on the unit in direct patient care. The direct patient care time included face-to-face time with the patient, reviewing the patient's chart, communicating with other professionals, and coordinating care. Greater than 50% of this time was spent in counseling or coordinating care with the patient regarding goals of hospitalization, psycho-education, and discharge planning needs.    I certify that inpatient services furnished can reasonably be expected to improve the patient's condition.    Donia Snell, NP 9/10/202512:10 PM   Donia Snell, NP 05/01/2024 12:10 PM

## 2024-05-01 NOTE — Discharge Instructions (Addendum)
  Base on the information you have provided and the presenting issue, outpatient services and resources for have been recommended.  It is imperative that you follow through with treatment recommendations within 5-7 days from the of discharge to mitigate further risk to your safety and mental well-being. A list of referrals has been provided below to get you started.  You are not limited to the list provided.  In case of an urgent crisis, you may contact the Mobile Crisis Unit with [facility] at [phone].   You have an intake appointment on at 8:30 this Friday, May 03, 2024 at [facility] on the second Floor in [place] .                          [facility]   Specialized Group Therapy for Substance Abuse If you have a substance abuse disorder (with or without a mental health condition), you may benefit from specialized substance abuse therapy in a group setting. According to discharge survey data, 70 percent of patients completing our chemical dependency intensive outpatient program report fewer symptoms of substance abuse and incidents of relapse.  Under the guidance of a licensed mental health professional, meet with your peers every Monday, Wednesday and Friday from 9 a.m. to 12 p.m. for 6 to 8 weeks to:  Learn about chemical dependency, mental illness and co-occurring disorders. Develop relapse-prevention skills. Set personalized goals with your treatment team. To build on the skills you gain, you can attend Alcoholics Anonymous or Narcotics Anonymous meetings in the evenings and access follow-up care through weekly group meetings with peers. Ongoing support promotes wellness and recovery.  For more information, call Darice Simpler or [employer], LCSW at [phone]. We work directly with employers and families to ensure you receive the care you need.

## 2024-05-01 NOTE — ED Notes (Signed)
 Blood successfully drawn on pt.

## 2024-05-01 NOTE — ED Notes (Signed)
 Pt is currently sleeping. Q 15 safety checks in place.

## 2024-05-01 NOTE — Group Note (Signed)
 Group Topic: Recovery Basics  Group Date: 05/01/2024 Start Time: 2000 End Time: 2100 Facilitators: Anice Benton LABOR, NT  Department: [facility]  Number of Participants: 3  Group Focus: relapse prevention, self-awareness, and substance abuse education(AA Group) Treatment Modality:  Patient-Centered Therapy Interventions utilized were group exercise Purpose: relapse prevention strategies  Name: Judy Robles Date of Birth: 24-Apr-1976  MR: [id number]    Level of Participation: active Quality of Participation: attentive and cooperative Interactions with others: gave feedback Mood/Affect: appropriate Triggers (if applicable): N/A Cognition: coherent/clear Progress: Moderate Response: Good   Plan: follow-up needed  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   HCAP (healthcare-associated pneumonia) 09/16/2018   LLL pneumonia 09/16/2018   Sepsis (HCC) 09/16/2018   Hyponatremia 09/16/2018   Hypokalemia 09/16/2018   Severe benzodiazepine use disorder (HCC) 07/10/2018   Bipolar II disorder (HCC)    PTSD (post-traumatic stress disorder) 11/21/2017   Community acquired pneumonia of right lower lobe of lung 09/02/2017   Sepsis associated hypotension (HCC) 09/02/2017   AKI (acute kidney injury) (HCC) 09/02/2017   Nausea and vomiting 09/02/2017   Drug overdose 04/22/2017   Chest pain 10/09/2016   Pressure injury of skin 10/05/2016   Polysubstance dependence including opioid type drug with complication, continuous use (HCC) 07/21/2016   Chronic pain  07/03/2015   Diarrhea 05/01/2014   Primary hypertension 04/17/2014

## 2024-05-01 NOTE — ED Notes (Signed)
 Pt sleeping in no acute distress. RR even and unlabored. Environment secured. Will continue to monitor for safety.

## 2024-05-01 NOTE — Care Management (Signed)
 [facility] Care Management   Writer met with patient to discuss her discharge planning.  The patient has a follow up appointment with the CD-IOP Team on 04-02-2024 at 8:30am with [facility] [address] on the second floor in [place]

## 2024-05-01 NOTE — ED Notes (Signed)
 Pt sitting in dayroom eating dinner. No acute distress noted. No concerns voiced. Informed pt to notify staff with any needs or assistance. Pt verbalized understanding and agreement. Will continue to monitor for safety.

## 2024-05-01 NOTE — ED Notes (Signed)
 Patient is in the bed calm and sleeping. NAD.  Respirations even and unlabored. Will monitor for safety.

## 2024-05-02 ENCOUNTER — Inpatient Hospital Stay (HOSPITAL_COMMUNITY)
Admission: EM | Admit: 2024-05-02 | Discharge: 2024-05-04 | DRG: 309 | Disposition: A | Discharge status: Home / Self Care | Payer: MEDICAID | Attending: Internal Medicine | Admitting: Internal Medicine

## 2024-05-02 ENCOUNTER — Other Ambulatory Visit: Payer: Self-pay

## 2024-05-02 DIAGNOSIS — F1510 Other stimulant abuse, uncomplicated: Secondary | ICD-10-CM | POA: Diagnosis present

## 2024-05-02 DIAGNOSIS — F332 Major depressive disorder, recurrent severe without psychotic features: Secondary | ICD-10-CM

## 2024-05-02 DIAGNOSIS — F1910 Other psychoactive substance abuse, uncomplicated: Secondary | ICD-10-CM | POA: Diagnosis present

## 2024-05-02 DIAGNOSIS — Z823 Family history of stroke: Secondary | ICD-10-CM

## 2024-05-02 DIAGNOSIS — F1220 Cannabis dependence, uncomplicated: Secondary | ICD-10-CM | POA: Diagnosis present

## 2024-05-02 DIAGNOSIS — F3181 Bipolar II disorder: Secondary | ICD-10-CM | POA: Diagnosis present

## 2024-05-02 DIAGNOSIS — R45851 Suicidal ideations: Secondary | ICD-10-CM | POA: Diagnosis present

## 2024-05-02 DIAGNOSIS — F1920 Other psychoactive substance dependence, uncomplicated: Secondary | ICD-10-CM

## 2024-05-02 DIAGNOSIS — Z79899 Other long term (current) drug therapy: Secondary | ICD-10-CM

## 2024-05-02 DIAGNOSIS — Z8249 Family history of ischemic heart disease and other diseases of the circulatory system: Secondary | ICD-10-CM

## 2024-05-02 DIAGNOSIS — Z5900 Homelessness unspecified: Secondary | ICD-10-CM

## 2024-05-02 DIAGNOSIS — Z653 Problems related to other legal circumstances: Secondary | ICD-10-CM

## 2024-05-02 DIAGNOSIS — Z23 Encounter for immunization: Secondary | ICD-10-CM

## 2024-05-02 DIAGNOSIS — N289 Disorder of kidney and ureter, unspecified: Secondary | ICD-10-CM | POA: Diagnosis present

## 2024-05-02 DIAGNOSIS — E559 Vitamin D deficiency, unspecified: Secondary | ICD-10-CM | POA: Diagnosis present

## 2024-05-02 DIAGNOSIS — F1020 Alcohol dependence, uncomplicated: Secondary | ICD-10-CM | POA: Diagnosis present

## 2024-05-02 DIAGNOSIS — Z87891 Personal history of nicotine dependence: Secondary | ICD-10-CM

## 2024-05-02 DIAGNOSIS — F4310 Post-traumatic stress disorder, unspecified: Secondary | ICD-10-CM | POA: Diagnosis present

## 2024-05-02 DIAGNOSIS — R001 Bradycardia, unspecified: Principal | ICD-10-CM | POA: Diagnosis present

## 2024-05-02 DIAGNOSIS — Z888 Allergy status to other drugs, medicaments and biological substances status: Secondary | ICD-10-CM

## 2024-05-02 DIAGNOSIS — E7800 Pure hypercholesterolemia, unspecified: Secondary | ICD-10-CM | POA: Diagnosis present

## 2024-05-02 DIAGNOSIS — F1120 Opioid dependence, uncomplicated: Secondary | ICD-10-CM | POA: Diagnosis present

## 2024-05-02 DIAGNOSIS — G2581 Restless legs syndrome: Secondary | ICD-10-CM | POA: Diagnosis present

## 2024-05-02 DIAGNOSIS — R5381 Other malaise: Secondary | ICD-10-CM | POA: Diagnosis present

## 2024-05-02 DIAGNOSIS — Z808 Family history of malignant neoplasm of other organs or systems: Secondary | ICD-10-CM

## 2024-05-02 DIAGNOSIS — F1420 Cocaine dependence, uncomplicated: Secondary | ICD-10-CM | POA: Diagnosis present

## 2024-05-02 DIAGNOSIS — F1310 Sedative, hypnotic or anxiolytic abuse, uncomplicated: Secondary | ICD-10-CM | POA: Diagnosis present

## 2024-05-02 DIAGNOSIS — I10 Essential (primary) hypertension: Secondary | ICD-10-CM | POA: Diagnosis present

## 2024-05-02 LAB — GLUCOSE, CAPILLARY: Glucose-Capillary: 125 mg/dL — ABNORMAL HIGH (ref 70–99)

## 2024-05-02 MED ORDER — GABAPENTIN 400 MG PO CAPS: 400.0000 mg | ORAL_CAPSULE | Freq: Three times a day (TID) | ORAL | 0 refills | Status: DC

## 2024-05-02 MED ORDER — OXYCODONE-ACETAMINOPHEN 5-325 MG PO TABS
1.0000 | ORAL_TABLET | Freq: Once | ORAL | Status: AC
  Administered 2024-05-02: 1 via ORAL
  Filled 2024-05-02: qty 1

## 2024-05-02 MED ORDER — LOPERAMIDE HCL 2 MG PO CAPS: 2.0000 mg | ORAL_CAPSULE | ORAL | Status: DC | PRN

## 2024-05-02 MED ORDER — ONDANSETRON 4 MG PO TBDP: 4.0000 mg | ORAL_TABLET | Freq: Four times a day (QID) | ORAL | Status: DC | PRN

## 2024-05-02 MED ORDER — METHOCARBAMOL 750 MG PO TABS
750.0000 mg | ORAL_TABLET | Freq: Three times a day (TID) | ORAL | Status: DC | PRN
  Administered 2024-05-02: 750 mg via ORAL

## 2024-05-02 MED ORDER — NAPROXEN 500 MG PO TABS: 500.0000 mg | ORAL_TABLET | Freq: Two times a day (BID) | ORAL | Status: DC | PRN

## 2024-05-02 MED ORDER — VITAMIN D (ERGOCALCIFEROL) 1.25 MG (50000 UNIT) PO CAPS: 50000.0000 [IU] | ORAL_CAPSULE | ORAL | 0 refills | Status: AC

## 2024-05-02 MED ORDER — DICYCLOMINE HCL 20 MG PO TABS: 20.0000 mg | ORAL_TABLET | Freq: Four times a day (QID) | ORAL | Status: DC | PRN

## 2024-05-02 NOTE — Group Note (Signed)
 Group Topic: Emotional Regulation  Group Date: 05/02/2024 Start Time: 1400 End Time: 1430 Facilitators: Laneta Renea POUR, NT  Department: [facility]  Number of Participants: 3  Group Focus: self-awareness Treatment Modality:  Psychoeducation Interventions utilized were problem solving Purpose: enhance coping skills  Name: [name] Date of Birth: 07-01-1976  MR: [id number]    Level of Participation: minimal Quality of Participation: cooperative Interactions with others: monopolizing Mood/Affect: positive Triggers (if applicable): none Cognition: coherent/clear Progress: None Response: none Plan: patient will be encouraged to attend group.   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   HCAP (healthcare-associated pneumonia) 09/16/2018   LLL pneumonia 09/16/2018   Sepsis (HCC) 09/16/2018   Hyponatremia 09/16/2018   Hypokalemia 09/16/2018   Severe benzodiazepine use disorder (HCC) 07/10/2018   Bipolar II disorder (HCC)    PTSD (post-traumatic stress disorder) 11/21/2017   Community acquired pneumonia of right lower lobe of lung 09/02/2017   Sepsis associated hypotension (HCC) 09/02/2017   AKI (acute kidney injury) (HCC) 09/02/2017   Nausea and vomiting 09/02/2017   Drug overdose 04/22/2017   Chest pain 10/09/2016   Pressure injury of skin 10/05/2016   Polysubstance dependence including opioid type drug with complication, continuous use (HCC) 07/21/2016   Chronic pain 07/03/2015   Diarrhea 05/01/2014   Primary hypertension 04/17/2014

## 2024-05-02 NOTE — ED Provider Notes (Signed)
 [facility] AT [facility] Provider Note   CSN: [id number] Arrival date & time: 05/02/24  2259     Patient presents with: Bradycardia   Judy Robles is a 48 y.o. female.  {Add pertinent medical, surgical, social history, OB history to HPI:6244} 48 year old female who presents from behavioral health secondary to reported symptomatic bradycardia.  Patient had heart rates in the 40s and was reporting xerostomia and fatigue.  She denied chest pain, shortness of breath or dizziness to the other provider however to me she does endorse some lightheadedness.  She also denies chest pain or shortness of breath manage states she has some right leg pain which is more chronic in nature.  Patient states that her lightheadedness and sleepiness seem to be mostly related to medication administration.  It seems to be pretty well correlated on her report.  No recent illnesses.  No history of heart problems.  Eat and drinking okay at the behavioral health facility.       Bentyl  20 Qth PRN (not had) Clonidine , 0.1, daily AM, bedtime (last yesterday evening) Gabapentin  400 TID (UTD) Haldol /benadryl /Ativan  PRN (not used) Atarax  50 TID PRN (this evening) Immodium PRN (Not had) Magnesium  oxide 400 at bedtime (tonight) Robaxin  750 q8h prn (this evening) Naproxen  Nicotine  patch Zofran  PRN Trazodone  100 (tonight)  Prior to Admission medications   Medication Sig Start Date End Date Taking? Authorizing Provider  gabapentin  (NEURONTIN ) 400 MG capsule Take 1 capsule (400 mg total) by mouth 3 (three) times daily. 05/02/24   White, Patrice L, NP  Vitamin D , Ergocalciferol , (DRISDOL ) 1.25 MG (50000 UNIT) CAPS capsule Take 1 capsule (50,000 Units total) by mouth every 7 (seven) days. 05/05/24   White, Patrice L, NP    Allergies: Gramineae pollens, Lisinopril , and Mirtazapine     Review of Systems  Updated Vital Signs BP (!) 148/51   Pulse (!) 46   Temp 97.7 F (36.5 C) (Oral)    Resp 16   SpO2 98%   Physical Exam Vitals and nursing note reviewed.  Constitutional:      Appearance: She is well-developed.  HENT:     Head: Normocephalic and atraumatic.  Cardiovascular:     Rate and Rhythm: Normal rate and regular rhythm.     Comments: DP pulses intact but not bounding, good cap refill Pulmonary:     Effort: No respiratory distress.     Breath sounds: No stridor.  Abdominal:     General: There is no distension.  Musculoskeletal:     Cervical back: Normal range of motion.  Neurological:     Mental Status: She is alert.     (all labs ordered are listed, but only abnormal results are displayed) Labs Reviewed  CBC WITH DIFFERENTIAL/PLATELET  BASIC METABOLIC PANEL WITH GFR  MAGNESIUM   RAPID URINE DRUG SCREEN, HOSP PERFORMED  URINALYSIS, ROUTINE W REFLEX MICROSCOPIC  TROPONIN I (HIGH SENSITIVITY)    EKG: None  Radiology: No results found.  {Document cardiac monitor, telemetry assessment procedure when appropriate:32947} Procedures   Medications Ordered in the ED  oxyCODONE -acetaminophen  (PERCOCET/ROXICET) 5-325 MG per tablet 1 tablet (has no administration in time range)      {Click here for ABCD2, HEART and other calculators REFRESH Note before signing:1}                              Medical Decision Making Amount and/or Complexity of Data Reviewed Labs: ordered.  Risk Prescription  drug management.  Suspect bradycardia and symptoms are likely medication related. Will check electrolytes and heart function, monitor.  ***  {Document critical care time when appropriate  Document review of labs and clinical decision tools ie CHADS2VASC2, etc  Document your independent review of radiology images and any outside records  Document your discussion with family members, caretakers and with consultants  Document social determinants of health affecting pt's care  Document your decision making why or why not admission, treatments were  needed:32947:::1}   Final diagnoses:  None    ED Discharge Orders     None

## 2024-05-02 NOTE — ED Notes (Signed)
 D/c 9/12, go directly to [facility] appt 8: 30 am

## 2024-05-02 NOTE — Group Note (Signed)
 Group Topic: Wellness  Group Date: 05/02/2024 Start Time: 0900 End Time: 1000 Facilitators: Daved Tinnie HERO, RN  Department: [facility]  Number of Participants: 7  Group Focus: psychiatric education Treatment Modality:  Psychoeducation Interventions utilized were patient education Purpose: relapse prevention strategies  Name: Judy Robles Date of Birth: 26-Apr-1976  MR: [id number]    Level of Participation: active Quality of Participation: attentive and cooperative Interactions with others: gave feedback Mood/Affect: appropriate Triggers (if applicable): n/a Cognition: coherent/clear Progress: Gaining insight Response: medication discussed with pt, further questions denied. Plan: patient will be encouraged to attend future RN education groups and to discuss medications with RN and provider.   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   HCAP (healthcare-associated pneumonia) 09/16/2018   LLL pneumonia 09/16/2018   Sepsis (HCC) 09/16/2018   Hyponatremia 09/16/2018   Hypokalemia 09/16/2018   Severe benzodiazepine use disorder (HCC) 07/10/2018   Bipolar II disorder (HCC)    PTSD (post-traumatic stress disorder) 11/21/2017   Community acquired pneumonia of right lower lobe of lung 09/02/2017   Sepsis associated hypotension (HCC) 09/02/2017   AKI (acute kidney injury) (HCC) 09/02/2017   Nausea and vomiting 09/02/2017   Drug overdose 04/22/2017   Chest pain 10/09/2016   Pressure injury of skin 10/05/2016   Polysubstance dependence including opioid  type drug with complication, continuous use (HCC) 07/21/2016   Chronic pain 07/03/2015   Diarrhea 05/01/2014   Primary hypertension 04/17/2014

## 2024-05-02 NOTE — ED Provider Notes (Addendum)
 Behavioral Health Progress Note  Date and Time: 05/02/2024 6:04 PM Name: Judy Robles MRN:  [id number]  Subjective:  Judy Robles is a 48 y.o. female patient with a documented psychiatric history significant for polysubstance abuse (opioid use, cocaine use, methamphetamine use, benzodiazepine, alcohol use), MDD, bipolar 2, and PTSD and a medical history of drug overdose, hypertension and restless leg syndrome who presented to the [facility] voluntary accompanied by law enforcement with complaints of suicidal thoughts with a plan, worsening depression and, anxiety and drug abuse. UDS positive for buprenorphine , methamphetamine, and oxycodone . BAL negative.  On evaluation, patient is lying down in bed in no acute distress. She denies suicidal thoughts. She denies homicidal thoughts. She denies auditory or visual hallucinations. No paranoia. Objectively, no signs of acute psychosis. She denies withdrawal or drug cravings. She states that she continues to feel depressed and scored a 18 on the PHQ-9 reassessment today. She denies physical complaints. She denies medication side effects. She states that she is unsure if she can stay with her child's father's and would like to consider shelter resources. She was asked to call her child's father to confirm whether or not she can live with him. The plan was to discharge the patient today however, she is agreeable to staying another night so that she is able to make it to the CD IOP appointment tomorrow, 9/12 at 8:30 AM to maintain her sobriety.   Diagnosis:  Final diagnoses:  Polysubstance (including opioids) dependence with physiological dependence (HCC)  Substance induced mood disorder (HCC)  Severe episode of recurrent major depressive disorder, without psychotic features (HCC)    Total Time spent with patient: 20 minutes  Past Psychiatric History: a documented psychiatric history significant for polysubstance abuse  (opioid use, cocaine use, methamphetamine use, benzodiazepine, alcohol use), MDD, bipolar 2, and PTSD.  Past Medical History: a medical history of drug overdose, hypertension and restless leg syndrome.  Family Psychiatric  History: No reported history.   Social History: Patient states that she is homeless but sometimes she stays with her child's father or she stays with a friend. She reports an upcoming court date for larceny on 05/16/24 and states that she is on probation in [place]. She denies outpatient psychiatry or counseling.   Current Medications:  Current Facility-Administered Medications  Medication Dose Route Frequency Provider Last Rate Last Admin   acetaminophen  (TYLENOL ) tablet 650 mg  650 mg Oral Q6H PRN Austina Constantin L, NP   650 mg at 05/02/24 1432   alum & mag hydroxide-simeth (MAALOX/MYLANTA) 200-200-20 MG/5ML suspension 30 mL  30 mL Oral Q4H PRN Jaquese Irving L, NP       cloNIDine  (CATAPRES ) tablet 0.1 mg  0.1 mg Oral QAC breakfast Taylyn Brame L, NP       haloperidol  (HALDOL ) tablet 5 mg  5 mg Oral TID PRN Julias Mould L, NP       And   diphenhydrAMINE  (BENADRYL ) capsule 50 mg  50 mg Oral TID PRN Finnigan Warriner L, NP       haloperidol  lactate (HALDOL ) injection 5 mg  5 mg Intramuscular TID PRN Tawnie Ehresman L, NP       And   diphenhydrAMINE  (BENADRYL ) injection 50 mg  50 mg Intramuscular TID PRN Temisha Murley L, NP       And   LORazepam  (ATIVAN ) injection 2 mg  2 mg Intramuscular TID PRN Makynna Manocchio L, NP       haloperidol  lactate (HALDOL )  injection 10 mg  10 mg Intramuscular TID PRN Rulon Abdalla L, NP       And   diphenhydrAMINE  (BENADRYL ) injection 50 mg  50 mg Intramuscular TID PRN Azyriah Nevins L, NP       And   LORazepam  (ATIVAN ) injection 2 mg  2 mg Intramuscular TID PRN Nastassja Witkop L, NP       gabapentin  (NEURONTIN ) capsule 400 mg  400 mg Oral TID Tex Drilling, NP   400 mg at 05/02/24 1517   hydrOXYzine  (ATARAX ) tablet 50 mg  50 mg Oral  TID PRN Tex Drilling, NP   50 mg at 05/02/24 1434   magnesium  hydroxide (MILK OF MAGNESIA) suspension 30 mL  30 mL Oral Daily PRN Gennie Dib L, NP       magnesium  oxide (MAG-OX) tablet 400 mg  400 mg Oral QHS Hill, Corean Massa, MD   400 mg at 05/01/24 2109   nicotine  (NICODERM CQ  - dosed in mg/24 hours) patch 14 mg  14 mg Transdermal Daily Leigh Corean Massa, MD   14 mg at 05/01/24 0915   nicotine  polacrilex (NICORETTE ) gum 2 mg  2 mg Oral PRN Leigh Corean Massa, MD       traZODone  (DESYREL ) tablet 100 mg  100 mg Oral QHS PRN Tex Drilling, NP   100 mg at 05/01/24 2109   Vitamin D  (Ergocalciferol ) (DRISDOL ) 1.25 MG (50000 UNIT) capsule 50,000 Units  50,000 Units Oral Q7 days Leigh Corean Massa, MD   50,000 Units at 04/28/24 1504   Current Outpatient Medications  Medication Sig Dispense Refill   gabapentin  (NEURONTIN ) 400 MG capsule Take 1 capsule (400 mg total) by mouth 3 (three) times daily. 90 capsule 0   [START ON 05/05/2024] Vitamin D , Ergocalciferol , (DRISDOL ) 1.25 MG (50000 UNIT) CAPS capsule Take 1 capsule (50,000 Units total) by mouth every 7 (seven) days. 4 capsule 0    Labs  Lab Results:  Admission on 04/27/2024  Component Date Value Ref Range Status   Free T4 04/27/2024 0.91  0.61 - 1.12 ng/dL Final   Comment: (NOTE) Biotin ingestion may interfere with free T4 tests. If the results are inconsistent with the TSH level, previous test results, or the clinical presentation, then consider biotin interference. If needed, order repeat testing after stopping biotin. Performed at [facility], [address], [place], [place] [address]    Sodium 04/30/2024 136  135 - 145 mmol/L Final   Potassium 04/30/2024 5.3 (H)  3.5 - 5.1 mmol/L Final   Chloride 04/30/2024 103  98 - 111 mmol/L Final   CO2 04/30/2024 26  22 - 32 mmol/L Final   Glucose, Bld 04/30/2024 117 (H)  70 - 99 mg/dL Final   Glucose reference range applies only to samples taken after fasting for at  least 8 hours.   BUN 04/30/2024 22 (H)  6 - 20 mg/dL Final   Creatinine, Ser 04/30/2024 1.16 (H)  0.44 - 1.00 mg/dL Final   Calcium  04/30/2024 8.7 (L)  8.9 - 10.3 mg/dL Final   Total Protein 90/90/7974 5.7 (L)  6.5 - 8.1 g/dL Final   Albumin 90/90/7974 2.5 (L)  3.5 - 5.0 g/dL Final   AST 90/90/7974 36  15 - 41 U/L Final   ALT 04/30/2024 26  0 - 44 U/L Final   Alkaline Phosphatase 04/30/2024 47  38 - 126 U/L Final   Total Bilirubin 04/30/2024 0.4  0.0 - 1.2 mg/dL Final   GFR, Estimated 04/30/2024 57 (L)  >  60 mL/min Final   Comment: (NOTE) Calculated using the CKD-EPI Creatinine Equation (2021)    Anion gap 04/30/2024 7  5 - 15 Final   Performed at [facility], [address] [place], [place] [address]  Admission on 04/27/2024, Discharged on 04/27/2024  Component Date Value Ref Range Status   WBC 04/27/2024 10.4  4.0 - 10.5 K/uL Final   RBC 04/27/2024 3.80 (L)  3.87 - 5.11 MIL/uL Final   Hemoglobin 04/27/2024 12.1  12.0 - 15.0 g/dL Final   HCT 90/93/7974 35.8 (L)  36.0 - 46.0 % Final   MCV 04/27/2024 94.2  80.0 - 100.0 fL Final   MCH 04/27/2024 31.8  26.0 - 34.0 pg Final   MCHC 04/27/2024 33.8  30.0 - 36.0 g/dL Final   RDW 90/93/7974 13.1  11.5 - 15.5 % Final   Platelets 04/27/2024 213  150 - 400 K/uL Final   nRBC 04/27/2024 0.0  0.0 - 0.2 % Final   Neutrophils Relative % 04/27/2024 62  % Final   Neutro Abs 04/27/2024 6.5  1.7 - 7.7 K/uL Final   Lymphocytes Relative 04/27/2024 28  % Final   Lymphs Abs 04/27/2024 2.9  0.7 - 4.0 K/uL Final   Monocytes Relative 04/27/2024 8  % Final   Monocytes Absolute 04/27/2024 0.8  0.1 - 1.0 K/uL Final   Eosinophils Relative 04/27/2024 1  % Final   Eosinophils Absolute 04/27/2024 0.1  0.0 - 0.5 K/uL Final   Basophils Relative 04/27/2024 1  % Final   Basophils Absolute 04/27/2024 0.1  0.0 - 0.1 K/uL Final   Immature Granulocytes 04/27/2024 0  % Final   Abs Immature Granulocytes 04/27/2024 0.04  0.00 - 0.07 K/uL Final   Performed at [facility], [address], [place], [place] [address]   Sodium 04/27/2024 133 (L)  135 - 145 mmol/L Final   Potassium 04/27/2024 3.5  3.5 - 5.1 mmol/L Final   Chloride 04/27/2024 101  98 - 111 mmol/L Final   CO2 04/27/2024 18 (L)  22 - 32 mmol/L Final   Glucose, Bld 04/27/2024 94  70 - 99 mg/dL Final   Glucose reference range applies only to samples taken after fasting for at least 8 hours.   BUN 04/27/2024 19  6 - 20 mg/dL Final   Creatinine, Ser 04/27/2024 1.36 (H)  0.44 - 1.00 mg/dL Final   Calcium  04/27/2024 8.7 (L)  8.9 - 10.3 mg/dL Final   Total Protein 90/93/7974 7.5  6.5 - 8.1 g/dL Final   Albumin 90/93/7974 3.6  3.5 - 5.0 g/dL Final   AST 90/93/7974 70 (H)  15 - 41 U/L Final   ALT 04/27/2024 39  0 - 44 U/L Final   Alkaline Phosphatase 04/27/2024 59  38 - 126 U/L Final   Total Bilirubin 04/27/2024 0.8  0.0 - 1.2 mg/dL Final   GFR, Estimated 04/27/2024 47 (L)  >60 mL/min Final   Comment: (NOTE) Calculated using the CKD-EPI Creatinine Equation (2021)    Anion gap 04/27/2024 14  5 - 15 Final   Performed at [facility], [address] [place], [place] [address]   Hgb A1c MFr Bld 04/27/2024 4.8  4.8 - 5.6 % Final   Comment: (NOTE) Diagnosis of Diabetes The following HbA1c ranges recommended by the American Diabetes Association (ADA) may be used as an aid in the diagnosis of diabetes mellitus.  Hemoglobin             Suggested  A1C NGSP%              Diagnosis  <5.7                   Non Diabetic  5.7-6.4                Pre-Diabetic  >6.4                   Diabetic  <7.0                   Glycemic control for                       adults with diabetes.     Mean Plasma Glucose 04/27/2024 91.06  mg/dL Final   Performed at [facility], [address], [place], [place] [address]   Alcohol, Ethyl (B) 04/27/2024 <15  <15 mg/dL Final   Comment: (NOTE) For medical purposes only. Performed at [facility], [address], [place], [place] [address]     Cholesterol 04/27/2024 142  0 - 200 mg/dL Final   Triglycerides 90/93/7974 59  <150 mg/dL Final   HDL 90/93/7974 53  >40 mg/dL Final   Total CHOL/HDL Ratio 04/27/2024 2.7  RATIO Final   VLDL 04/27/2024 12  0 - 40 mg/dL Final   LDL Cholesterol 04/27/2024 77  0 - 99 mg/dL Final   Comment:        Total Cholesterol/HDL:CHD Risk Coronary Heart Disease Risk Table                     Men   Women  1/2 Average Risk   3.4   3.3  Average Risk       5.0   4.4  2 X Average Risk   9.6   7.1  3 X Average Risk  23.4   11.0        Use the calculated Patient Ratio above and the CHD Risk Table to determine the patient's CHD Risk.        ATP III CLASSIFICATION (LDL):  <100     mg/dL   Optimal  899-870  mg/dL   Near or Above                    Optimal  130-159  mg/dL   Borderline  839-810  mg/dL   High  >809     mg/dL   Very High Performed at [facility], [address] [place], [place] [address]    TSH 04/27/2024 5.052 (H)  0.350 - 4.500 uIU/mL Final   Comment: Performed by a 3rd Generation assay with a functional sensitivity of <=0.01 uIU/mL. Performed at [facility], [address], [place], [place] [address]    RPR Ser Ql 04/27/2024 NON REACTIVE  NON REACTIVE Final   Performed at [facility], [address], [place], [place] [address]   POC Amphetamine  UR 04/27/2024 None Detected  NONE DETECTED (Cut Off Level 1000 ng/mL) Final   POC Secobarbital (BAR) 04/27/2024 None Detected  NONE DETECTED (Cut Off Level 300 ng/mL) Final   POC Buprenorphine  (BUP) 04/27/2024 Positive (A)  NONE DETECTED (Cut Off Level 10 ng/mL) Final   POC Oxazepam (BZO) 04/27/2024 None Detected  NONE DETECTED (Cut Off Level 300 ng/mL) Final   POC Cocaine UR 04/27/2024 None Detected  NONE DETECTED (Cut Off Level 300 ng/mL)  Final   POC Methamphetamine UR 04/27/2024 Positive (A)  NONE DETECTED (Cut Off Level 1000 ng/mL) Final   POC Morphine  04/27/2024 None Detected  NONE DETECTED (Cut Off Level 300 ng/mL)  Final   POC Methadone UR 04/27/2024 None Detected  NONE DETECTED (Cut Off Level 300 ng/mL) Final   POC Oxycodone  UR 04/27/2024 Positive (A)  NONE DETECTED (Cut Off Level 100 ng/mL) Final   POC Marijuana UR 04/27/2024 None Detected  NONE DETECTED (Cut Off Level 50 ng/mL) Final   Vit D, 25-Hydroxy 04/27/2024 27.64 (L)  30 - 100 ng/mL Final   Comment: (NOTE) Vitamin D  deficiency has been defined by the Institute of Medicine  and an Endocrine Society practice guideline as a level of serum 25-OH  vitamin D  less than 20 ng/mL (1,2). The Endocrine Society went on to  further define vitamin D  insufficiency as a level between 21 and 29  ng/mL (2).  1. IOM (Institute of Medicine). 2010. Dietary reference intakes for  calcium  and D. [place]: [employer]. 2. Holick MF, Binkley [place], Bischoff-Ferrari HA, et al. Evaluation,  treatment, and prevention of vitamin D  deficiency: an Endocrine  Society clinical practice guideline, JCEM. 2011 Jul; 96(7): 1911-30.  Performed at [facility], [address], [place], [place] [address]    Vitamin B-12 04/27/2024 369  180 - 914 pg/mL Final   Comment: (NOTE) This assay is not validated for testing neonatal or myeloproliferative syndrome specimens for Vitamin B12 levels. Performed at [facility], [address], [place], [place] [address]    Magnesium  04/27/2024 1.6 (L)  1.7 - 2.4 mg/dL Final   Performed at [facility], [address], [place], [place] [address]   HIV Screen 4th Generation wRfx 04/27/2024 Non Reactive  Non Reactive Final   Performed at [facility], [address] [place], [place] [address]  Admission on 01/06/2024, Discharged on 01/08/2024  Component Date Value Ref Range Status   Sodium 01/06/2024 133 (L)  135 - 145 mmol/L Final   Potassium 01/06/2024 4.4  3.5 - 5.1 mmol/L Final   Chloride 01/06/2024 103  98 - 111 mmol/L Final   CO2 01/06/2024 22  22 - 32 mmol/L Final   Glucose, Bld  01/06/2024 94  70 - 99 mg/dL Final   Glucose reference range applies only to samples taken after fasting for at least 8 hours.   BUN 01/06/2024 26 (H)  6 - 20 mg/dL Final   Creatinine, Ser 01/06/2024 1.17 (H)  0.44 - 1.00 mg/dL Final   Calcium  01/06/2024 8.6 (L)  8.9 - 10.3 mg/dL Final   Total Protein 94/82/7974 7.4  6.5 - 8.1 g/dL Final   Albumin 94/82/7974 3.3 (L)  3.5 - 5.0 g/dL Final   AST 94/82/7974 24  15 - 41 U/L Final   ALT 01/06/2024 19  0 - 44 U/L Final   Alkaline Phosphatase 01/06/2024 72  38 - 126 U/L Final   Total Bilirubin 01/06/2024 0.6  0.0 - 1.2 mg/dL Final   GFR, Estimated 01/06/2024 57 (L)  >60 mL/min Final   Comment: (NOTE) Calculated using the CKD-EPI Creatinine Equation (2021)    Anion gap 01/06/2024 8  5 - 15 Final   Performed at [facility], [address] [place] [address] [place], [place] [address]   Alcohol, Ethyl (B) 01/06/2024 <15  <15 mg/dL Final   Comment: Please note change in reference range. (NOTE) For medical purposes only. Performed at [facility], [address], [place], [place] [address]    WBC 01/06/2024 7.1  4.0 - 10.5 K/uL Final   RBC 01/06/2024 3.90  3.87 - 5.11 MIL/uL Final   Hemoglobin 01/06/2024 12.4  12.0 - 15.0 g/dL Final   HCT 94/82/7974 38.7  36.0 - 46.0 % Final   MCV 01/06/2024 99.2  80.0 - 100.0 fL Final   MCH 01/06/2024 31.8  26.0 - 34.0 pg Final   MCHC 01/06/2024 32.0  30.0 - 36.0 g/dL Final   RDW 94/82/7974 12.9  11.5 - 15.5 % Final   Platelets 01/06/2024 249  150 - 400 K/uL Final   nRBC 01/06/2024 0.0  0.0 - 0.2 % Final   Performed at [facility], [address], [place], [place] [address]   Opiates 01/06/2024 NONE DETECTED  NONE DETECTED Final   Cocaine 01/06/2024 POSITIVE (A)  NONE DETECTED Final   Benzodiazepines 01/06/2024 POSITIVE (A)  NONE DETECTED Final   Amphetamines 01/06/2024 POSITIVE (A)  NONE DETECTED Final   Comment: (NOTE) Trazodone  is metabolized in vivo to several  metabolites, including pharmacologically active m-CPP, which is excreted in the urine. Immunoassay screens for amphetamines and MDMA have potential cross-reactivity with these compounds and may provide false positive  results.     Tetrahydrocannabinol 01/06/2024 POSITIVE (A)  NONE DETECTED Final   Barbiturates 01/06/2024 NONE DETECTED  NONE DETECTED Final   Comment: (NOTE) DRUG SCREEN FOR MEDICAL PURPOSES ONLY.  IF CONFIRMATION IS NEEDED FOR ANY PURPOSE, NOTIFY LAB WITHIN 5 DAYS.  LOWEST DETECTABLE LIMITS FOR URINE DRUG SCREEN Drug Class                     Cutoff (ng/mL) Amphetamine  and metabolites    1000 Barbiturate and metabolites    200 Benzodiazepine                 200 Opiates and metabolites        300 Cocaine and metabolites        300 THC                            50 Performed at [facility], [address], [place], [place] [address]    Preg, Serum 01/06/2024 NEGATIVE  NEGATIVE Final   Comment:        THE SENSITIVITY OF THIS METHODOLOGY IS >10 mIU/mL. Performed at [facility], [address], [place], [place] [address]   Admission on 01/02/2024, Discharged on 01/02/2024  Component Date Value Ref Range Status   SARS Coronavirus 2 by RT PCR 01/02/2024 NEGATIVE  NEGATIVE Final   Influenza A by PCR 01/02/2024 NEGATIVE  NEGATIVE Final   Influenza B by PCR 01/02/2024 NEGATIVE  NEGATIVE Final   Comment: (NOTE) The Xpert Xpress SARS-CoV-2/FLU/RSV plus assay is intended as an aid in the diagnosis of influenza from Nasopharyngeal swab specimens and should not be used as a sole basis for treatment. Nasal washings and aspirates are unacceptable for Xpert Xpress SARS-CoV-2/FLU/RSV testing.  Fact Sheet for Patients: BloggerCourse.com  Fact Sheet for Healthcare Providers: SeriousBroker.it  This test is not yet approved or cleared by the [place]  FDA and has been authorized  for detection and/or diagnosis of SARS-CoV-2 by FDA under an Emergency Use Authorization (EUA). This EUA will remain in effect (meaning this test can be used) for the duration of the COVID-19 declaration under Section 564(b)(1) of the Act, 21 U.S.C.  section 360bbb-3(b)(1), unless the authorization is terminated or revoked.     Resp Syncytial Virus by PCR 01/02/2024 NEGATIVE  NEGATIVE Final   Comment: (NOTE) Fact Sheet for Patients: BloggerCourse.com  Fact Sheet for Healthcare Providers: SeriousBroker.it  This test is not yet approved or cleared by the [place]  FDA and has been authorized for detection and/or diagnosis of SARS-CoV-2 by FDA under an Emergency Use Authorization (EUA). This EUA will remain in effect (meaning this test can be used) for the duration of the COVID-19 declaration under Section 564(b)(1) of the Act, 21 U.S.C. section 360bbb-3(b)(1), unless the authorization is terminated or revoked.  Performed at [facility], [address] [place], [place] [address]    WBC 01/02/2024 7.9  4.0 - 10.5 K/uL Final   RBC 01/02/2024 4.18  3.87 - 5.11 MIL/uL Final   Hemoglobin 01/02/2024 13.2  12.0 - 15.0 g/dL Final   HCT 94/86/7974 40.9  36.0 - 46.0 % Final   MCV 01/02/2024 97.8  80.0 - 100.0 fL Final   MCH 01/02/2024 31.6  26.0 - 34.0 pg Final   MCHC 01/02/2024 32.3  30.0 - 36.0 g/dL Final   RDW 94/86/7974 12.9  11.5 - 15.5 % Final   Platelets 01/02/2024 230  150 - 400 K/uL Final   nRBC 01/02/2024 0.0  0.0 - 0.2 % Final   Neutrophils Relative % 01/02/2024 63  % Final   Neutro Abs 01/02/2024 5.0  1.7 - 7.7 K/uL Final   Lymphocytes Relative 01/02/2024 26  % Final   Lymphs Abs 01/02/2024 2.1  0.7 - 4.0 K/uL Final   Monocytes Relative 01/02/2024 9  % Final   Monocytes Absolute 01/02/2024 0.7  0.1 - 1.0 K/uL Final   Eosinophils Relative 01/02/2024 1  % Final   Eosinophils Absolute 01/02/2024 0.1  0.0 - 0.5 K/uL  Final   Basophils Relative 01/02/2024 1  % Final   Basophils Absolute 01/02/2024 0.1  0.0 - 0.1 K/uL Final   Immature Granulocytes 01/02/2024 0  % Final   Abs Immature Granulocytes 01/02/2024 0.02  0.00 - 0.07 K/uL Final   Performed at [facility], [address], [place], [place] [address]   Sodium 01/02/2024 138  135 - 145 mmol/L Final   Potassium 01/02/2024 3.7  3.5 - 5.1 mmol/L Final   Chloride 01/02/2024 105  98 - 111 mmol/L Final   CO2 01/02/2024 22  22 - 32 mmol/L Final   Glucose, Bld 01/02/2024 96  70 - 99 mg/dL Final   Glucose reference range applies only to samples taken after fasting for at least 8 hours.   BUN 01/02/2024 10  6 - 20 mg/dL Final   Creatinine, Ser 01/02/2024 0.74  0.44 - 1.00 mg/dL Final   Calcium  01/02/2024 9.3  8.9 - 10.3 mg/dL Final   Total Protein 94/86/7974 7.7  6.5 - 8.1 g/dL Final   Albumin 94/86/7974 3.1 (L)  3.5 - 5.0 g/dL Final   AST 94/86/7974 26  15 - 41 U/L Final   ALT 01/02/2024 22  0 - 44 U/L Final   Alkaline Phosphatase 01/02/2024 66  38 - 126 U/L Final   Total Bilirubin 01/02/2024 0.5  0.0 - 1.2 mg/dL Final   GFR, Estimated 01/02/2024 >60  >60 mL/min Final   Comment: (NOTE) Calculated using the CKD-EPI Creatinine Equation (2021)    Anion gap 01/02/2024 11  5 - 15 Final   Performed at [facility], [address] [place], [place]  [address]  Admission on 12/14/2023, Discharged on 12/17/2023  Component Date Value Ref Range Status   WBC 12/15/2023 4.4  4.0 - 10.5 K/uL Final   RBC 12/15/2023 3.54 (L)  3.87 - 5.11 MIL/uL Final   Hemoglobin 12/15/2023 11.3 (L)  12.0 - 15.0 g/dL Final   HCT 95/74/7974 34.7 (L)  36.0 - 46.0 % Final   MCV 12/15/2023 98.0  80.0 - 100.0 fL Final   MCH 12/15/2023 31.9  26.0 - 34.0 pg Final   MCHC 12/15/2023 32.6  30.0 - 36.0 g/dL Final   RDW 95/74/7974 14.2  11.5 - 15.5 % Final   Platelets 12/15/2023 150  150 - 400 K/uL Final   nRBC 12/15/2023 0.0  0.0 - 0.2 % Final   Neutrophils Relative % 12/15/2023  47  % Final   Neutro Abs 12/15/2023 2.1  1.7 - 7.7 K/uL Final   Lymphocytes Relative 12/15/2023 41  % Final   Lymphs Abs 12/15/2023 1.8  0.7 - 4.0 K/uL Final   Monocytes Relative 12/15/2023 8  % Final   Monocytes Absolute 12/15/2023 0.4  0.1 - 1.0 K/uL Final   Eosinophils Relative 12/15/2023 3  % Final   Eosinophils Absolute 12/15/2023 0.1  0.0 - 0.5 K/uL Final   Basophils Relative 12/15/2023 1  % Final   Basophils Absolute 12/15/2023 0.0  0.0 - 0.1 K/uL Final   Immature Granulocytes 12/15/2023 0  % Final   Abs Immature Granulocytes 12/15/2023 0.01  0.00 - 0.07 K/uL Final   Performed at [facility], [address] [place], [place] [address]   Sodium 12/15/2023 136  135 - 145 mmol/L Final   Potassium 12/15/2023 4.2  3.5 - 5.1 mmol/L Final   Chloride 12/15/2023 106  98 - 111 mmol/L Final   CO2 12/15/2023 21 (L)  22 - 32 mmol/L Final   Glucose, Bld 12/15/2023 77  70 - 99 mg/dL Final   Glucose reference range applies only to samples taken after fasting for at least 8 hours.   BUN 12/15/2023 23 (H)  6 - 20 mg/dL Final   Creatinine, Ser 12/15/2023 0.72  0.44 - 1.00 mg/dL Final   Calcium  12/15/2023 8.2 (L)  8.9 - 10.3 mg/dL Final   Total Protein 95/74/7974 5.8 (L)  6.5 - 8.1 g/dL Final   Albumin 95/74/7974 2.5 (L)  3.5 - 5.0 g/dL Final   AST 95/74/7974 40  15 - 41 U/L Final   ALT 12/15/2023 36  0 - 44 U/L Final   Alkaline Phosphatase 12/15/2023 40  38 - 126 U/L Final   Total Bilirubin 12/15/2023 0.5  0.0 - 1.2 mg/dL Final   GFR, Estimated 12/15/2023 >60  >60 mL/min Final   Comment: (NOTE) Calculated using the CKD-EPI Creatinine Equation (2021)    Anion gap 12/15/2023 9  5 - 15 Final   Performed at [facility], [address] [place], [place] [address]   Hgb A1c MFr Bld 12/15/2023 4.9  4.8 - 5.6 % Final   Comment: (NOTE) Pre diabetes:          5.7%-6.4%  Diabetes:              >6.4%  Glycemic control for   <7.0% adults with diabetes    Mean Plasma Glucose 12/15/2023  93.93  mg/dL Final   Performed at [facility], [address], [place], [place] [address]   Alcohol, Ethyl (B) 12/15/2023 <15  <15 mg/dL Final   Comment: Please note  change in reference range. (NOTE) For medical purposes only. Performed at [facility], [address] [place], [place] [address]    RPR Ser Ql 12/15/2023 NON REACTIVE  NON REACTIVE Final   Performed at [facility], [address] [place], [place] [address]   HIV Screen 4th Generation wRfx 12/15/2023 Non Reactive  Non Reactive Final   Performed at [facility], [address], [place], [place] [address]   Cholesterol 12/15/2023 133  0 - 200 mg/dL Final   Triglycerides 95/74/7974 34  <150 mg/dL Final   HDL 95/74/7974 57  >40 mg/dL Final   Total CHOL/HDL Ratio 12/15/2023 2.3  RATIO Final   VLDL 12/15/2023 7  0 - 40 mg/dL Final   LDL Cholesterol 12/15/2023 69  0 - 99 mg/dL Final   Comment:        Total Cholesterol/HDL:CHD Risk Coronary Heart Disease Risk Table                     Men   Women  1/2 Average Risk   3.4   3.3  Average Risk       5.0   4.4  2 X Average Risk   9.6   7.1  3 X Average Risk  23.4   11.0        Use the calculated Patient Ratio above and the CHD Risk Table to determine the patient's CHD Risk.        ATP III CLASSIFICATION (LDL):  <100     mg/dL   Optimal  899-870  mg/dL   Near or Above                    Optimal  130-159  mg/dL   Borderline  839-810  mg/dL   High  >809     mg/dL   Very High Performed at [facility], [address], [place], [place] [address]    TSH 12/15/2023 2.653  0.350 - 4.500 uIU/mL Final   Comment: Performed by a 3rd Generation assay with a functional sensitivity of <=0.01 uIU/mL. Performed at [facility], [address] [place], [place] [address]   Admission on 12/14/2023, Discharged on 12/14/2023  Component Date Value Ref Range Status   Neisseria Gonorrhea 12/14/2023 Negative   Final   Chlamydia 12/14/2023 Negative    Final   Comment 12/14/2023 Normal Reference Ranger Chlamydia - Negative   Final   Comment 12/14/2023 Normal Reference Range Neisseria Gonorrhea - Negative   Final   Preg Test, Ur 12/14/2023 Negative  Negative Final   POC Amphetamine  UR 12/14/2023 None Detected  NONE DETECTED (Cut Off Level 1000 ng/mL) Final   POC Secobarbital (BAR) 12/14/2023 None Detected  NONE DETECTED (Cut Off Level 300 ng/mL) Final   POC Buprenorphine  (BUP) 12/14/2023 None Detected  NONE DETECTED (Cut Off Level 10 ng/mL) Final   POC Oxazepam (BZO) 12/14/2023 None Detected  NONE DETECTED (Cut Off Level 300 ng/mL) Final   POC Cocaine UR 12/14/2023 Positive (A)  NONE DETECTED (Cut Off Level 300 ng/mL) Final   POC Methamphetamine UR 12/14/2023 None Detected  NONE DETECTED (Cut Off Level 1000 ng/mL) Final   POC Morphine  12/14/2023 None Detected  NONE DETECTED (Cut Off Level 300 ng/mL) Final   POC Methadone UR 12/14/2023 None Detected  NONE DETECTED (Cut Off Level 300 ng/mL) Final   POC Oxycodone  UR 12/14/2023 Positive (A)  NONE DETECTED (Cut Off Level 100  ng/mL) Final   POC Marijuana UR 12/14/2023 Positive (A)  NONE DETECTED (Cut Off Level 50 ng/mL) Final  Admission on 11/20/2023, Discharged on 11/20/2023  Component Date Value Ref Range Status   Sodium 11/20/2023 138  135 - 145 mmol/L Final   Potassium 11/20/2023 3.5  3.5 - 5.1 mmol/L Final   Chloride 11/20/2023 102  98 - 111 mmol/L Final   CO2 11/20/2023 25  22 - 32 mmol/L Final   Glucose, Bld 11/20/2023 89  70 - 99 mg/dL Final   Glucose reference range applies only to samples taken after fasting for at least 8 hours.   BUN 11/20/2023 9  6 - 20 mg/dL Final   Creatinine, Ser 11/20/2023 0.87  0.44 - 1.00 mg/dL Final   Calcium  11/20/2023 9.5  8.9 - 10.3 mg/dL Final   Total Protein 96/68/7974 7.7  6.5 - 8.1 g/dL Final   Albumin 96/68/7974 3.5  3.5 - 5.0 g/dL Final   AST 96/68/7974 81 (H)  15 - 41 U/L Final   ALT 11/20/2023 60 (H)  0 - 44 U/L Final   Alkaline Phosphatase  11/20/2023 62  38 - 126 U/L Final   Total Bilirubin 11/20/2023 0.5  0.0 - 1.2 mg/dL Final   GFR, Estimated 11/20/2023 >60  >60 mL/min Final   Comment: (NOTE) Calculated using the CKD-EPI Creatinine Equation (2021)    Anion gap 11/20/2023 11  5 - 15 Final   Performed at [facility], [address] [place], [place] [address]   WBC 11/20/2023 6.2  4.0 - 10.5 K/uL Final   RBC 11/20/2023 4.02  3.87 - 5.11 MIL/uL Final   Hemoglobin 11/20/2023 12.8  12.0 - 15.0 g/dL Final   HCT 96/68/7974 39.0  36.0 - 46.0 % Final   MCV 11/20/2023 97.0  80.0 - 100.0 fL Final   MCH 11/20/2023 31.8  26.0 - 34.0 pg Final   MCHC 11/20/2023 32.8  30.0 - 36.0 g/dL Final   RDW 96/68/7974 14.4  11.5 - 15.5 % Final   Platelets 11/20/2023 164  150 - 400 K/uL Final   nRBC 11/20/2023 0.0  0.0 - 0.2 % Final   Neutrophils Relative % 11/20/2023 53  % Final   Neutro Abs 11/20/2023 3.3  1.7 - 7.7 K/uL Final   Lymphocytes Relative 11/20/2023 33  % Final   Lymphs Abs 11/20/2023 2.1  0.7 - 4.0 K/uL Final   Monocytes Relative 11/20/2023 8  % Final   Monocytes Absolute 11/20/2023 0.5  0.1 - 1.0 K/uL Final   Eosinophils Relative 11/20/2023 5  % Final   Eosinophils Absolute 11/20/2023 0.3  0.0 - 0.5 K/uL Final   Basophils Relative 11/20/2023 1  % Final   Basophils Absolute 11/20/2023 0.1  0.0 - 0.1 K/uL Final   Immature Granulocytes 11/20/2023 0  % Final   Abs Immature Granulocytes 11/20/2023 0.02  0.00 - 0.07 K/uL Final   Performed at [facility], [address], [place], [place] [address]   Alcohol, Ethyl (B) 11/20/2023 144 (H)  <10 mg/dL Final   Comment: (NOTE) Lowest detectable limit for serum alcohol is 10 mg/dL.  For medical purposes only. Performed at [facility], [address] [place], [place] [address]    Acetaminophen  (Tylenol ), Serum 11/20/2023 <10 (L)  10 - 30 ug/mL Final   Comment: (NOTE) Therapeutic concentrations vary significantly. A range of 10-30 ug/mL  may be an effective  concentration for many patients. However, some  are best treated  at concentrations outside of this range. Acetaminophen  concentrations >150 ug/mL at 4 hours after ingestion  and >50 ug/mL at 12 hours after ingestion are often associated with  toxic reactions.  Performed at [facility], [address] [place], [place] [address]    Specimen Source 11/20/2023 URINE, CLEAN CATCH   Final   Color, Urine 11/20/2023 YELLOW  YELLOW Final   APPearance 11/20/2023 HAZY (A)  CLEAR Final   Specific Gravity, Urine 11/20/2023 1.008  1.005 - 1.030 Final   pH 11/20/2023 6.0  5.0 - 8.0 Final   Glucose, UA 11/20/2023 NEGATIVE  NEGATIVE mg/dL Final   Hgb urine dipstick 11/20/2023 SMALL (A)  NEGATIVE Final   Bilirubin Urine 11/20/2023 NEGATIVE  NEGATIVE Final   Ketones, ur 11/20/2023 NEGATIVE  NEGATIVE mg/dL Final   Protein, ur 96/68/7974 NEGATIVE  NEGATIVE mg/dL Final   Nitrite 96/68/7974 NEGATIVE  NEGATIVE Final   Leukocytes,Ua 11/20/2023 NEGATIVE  NEGATIVE Final   RBC / HPF 11/20/2023 0-5  0 - 5 RBC/hpf Final   WBC, UA 11/20/2023 0-5  0 - 5 WBC/hpf Final   Comment:        Reflex urine culture not performed if WBC <=10, OR if Squamous epithelial cells >5. If Squamous epithelial cells >5 suggest recollection.    Bacteria, UA 11/20/2023 RARE (A)  NONE SEEN Final   Squamous Epithelial / HPF 11/20/2023 0-5  0 - 5 /HPF Final   Mucus 11/20/2023 PRESENT   Final   Performed at [facility], [address], [place], [place] [address]   Preg, Serum 11/20/2023 NEGATIVE  NEGATIVE Final   Comment:        THE SENSITIVITY OF THIS METHODOLOGY IS >10 mIU/mL. Performed at [facility], [address], [place], [place] [address]     Blood Alcohol level:  Lab Results  Component Value Date   [facility] <15 04/27/2024   ETH <15 01/06/2024    Metabolic Disorder Labs: Lab Results  Component Value Date   HGBA1C 4.8 04/27/2024   MPG 91.06 04/27/2024   MPG 93.93 12/15/2023   No results found  for: PROLACTIN Lab Results  Component Value Date   CHOL 142 04/27/2024   TRIG 59 04/27/2024   HDL 53 04/27/2024   CHOLHDL 2.7 04/27/2024   VLDL 12 04/27/2024   LDLCALC 77 04/27/2024   LDLCALC 69 12/15/2023    Therapeutic Lab Levels: No results found for: LITHIUM  No results found for: VALPROATE No results found for: CBMZ  Physical Findings   AIMS    Flowsheet Row [facility]) [facility] Ad[facility] (Discharged[facility] Ad[facility] (Discharged) [facility] ([facility]) [facility]  AIMS Total Score 0 0 0 0 0   AUDIT    Flowsheet Row [facility]21[facility]2023 in [facility] Ad[facility] (Discharged) [facility] Ad[facility] (Discharged[facility] ([facility]) [facility]  Alcohol Use Disorder Identification Test Final Score (AUDIT) 24 19 0 0 1   PHQ2-9    Flowsheet Row ED from 04/27/2024 in [facility] Most recent reading at 05/02/2024  6:03 PM ED from 04/27/2024 in [facility] Most recent reading at 04/27/2024  1:49 PM ED from 12/14/2023 in [facility] Most recent reading at 12/17/2023  9:36 AM ED from 06/21/2023 in [facility] Most recent reading at 06/26/2023  2:53 PM Office Visit from 05/01/2014 in [facility] Most recent reading at 05/01/2014  2:05 PM   PHQ-2 Total Score 4 5 4  0 0  PHQ-9 Total Score 18 24 14  0 --   Flowsheet Row ED from 04/27/2024 in [facility] Most recent reading at 04/27/2024  3:36 PM ED from 04/27/2024 in [facility] Most recent reading at 04/27/2024  1:15 PM ED from 01/06/2024 in [facility] Most recent reading at 01/06/2024  9:33 PM  C-SSRS RISK CATEGORY High Risk High Risk No Risk     Musculoskeletal  Strength & Muscle Tone: within normal limits Gait & Station: normal Patient leans: N/A  Psychiatric Specialty Exam  Presentation  General Appearance:  Appropriate for Environment  Eye Contact: Fair  Speech: Clear and Coherent  Speech Volume: Normal  Handedness: Right   Mood and Affect  Mood: Dysphoric  Affect: Congruent   Thought Process  Thought Processes: Coherent  Descriptions of Associations:Intact  Orientation:Full (Time, Place and Person)  Thought Content:Logical  Diagnosis of Schizophrenia or Schizoaffective disorder in past: No    Hallucinations:Hallucinations: None  Ideas of Reference:None  Suicidal Thoughts:Suicidal Thoughts: No  Homicidal Thoughts:Homicidal Thoughts: No   Sensorium  Memory: Immediate Fair; Recent Fair; Remote Fair  Judgment: Fair  Insight: Fair   Chartered certified accountant: Fair  Attention Span: Fair  Recall: [employer] of Knowledge: Fair  Language: Fair   Psychomotor Activity  Psychomotor Activity: Psychomotor Activity: Normal   Assets  Assets: Communication Skills; Desire for Improvement   Sleep  Sleep: Sleep: Fair  Estimated Sleeping Duration (Last 24 Hours): 11.50-12.25 hours  Nutritional Assessment (For OBS and FBC admissions only) Has the patient had a weight loss or gain of 10 pounds or more in the last 3 months?: No Has the patient had a decrease in food intake/or appetite?: No Does the patient have  dental problems?: No Does the patient have eating habits or behaviors that may be indicators of an eating disorder including binging or inducing vomiting?: No Has the patient recently lost weight without trying?: 0 Has the patient been eating poorly because of a decreased appetite?: 0 Malnutrition Screening Tool Score: 0    Physical Exam  Physical Exam Cardiovascular:     Rate and Rhythm: Bradycardia present.  Pulmonary:     Effort: Pulmonary effort is normal.  Musculoskeletal:        General: Normal range of motion.     Cervical back: Normal range of motion.  Neurological:     Mental Status: She is alert and oriented to person, place, and time.    Review of Systems  Constitutional: Negative.   HENT: Negative.    Eyes: Negative.   Respiratory: Negative.    Cardiovascular: Negative.   Gastrointestinal: Negative.   Musculoskeletal: Negative.   Neurological: Negative.    Blood pressure 108/62, pulse (!) 45, temperature 98.4 F (36.9 C), resp. rate 17, SpO2 98%. There is no height or weight on file to calculate BMI.  Treatment Plan Summary: Patient was admitted to the [facility] on 04/27/2024 for mood stabilization, SI and substance use. Patient is voluntary. Anticipated discharge date is as reported 05/03/2024 and patient is to go upstairs to her CD IOP follow-up appointment at 8:30 AM.  Medication regimen Clonidine  taper ended today, 9/11 Continue vitamin D  50,000 units every 7 days  Labs, no new labs to review UDS positive for buprenorphine , methamphetamine, and oxycodone . BAL negative.  Vital signs Nursing to repeat HR manually.   Teresa Wyline CROME, NP 05/02/2024 6:04 PM

## 2024-05-02 NOTE — ED Notes (Signed)
 Pt CBG is 125.

## 2024-05-02 NOTE — ED Triage Notes (Signed)
 Pt BIB [facility] from [facility] for bradycardia x 8 hours. Pt's HR was in 40's per EMS. Pt was at [facility] for depression. Pt's only complaint is mild dizziness and bilateral leg pain x 2 days.

## 2024-05-02 NOTE — ED Notes (Signed)
 Manual HR taken RN- 42 bpm, MHT-52 bpm, irregularity observed. Provider notified, MHT currently obtaining EKG as ordered. Pt denies hx of cardiac conditions. Pt also reports 'not feeling herself' lately. Pt also requesting robaxin  r/t chronic pain management.

## 2024-05-02 NOTE — ED Notes (Signed)
 Pt transported to [facility] for medical clearance of bradycardia by EMS. Pt will return to [facility] after medical clearance. Pt belongings sent with transportation.

## 2024-05-02 NOTE — ED Notes (Signed)
 Patient is in the bedroom sleeping. NAD

## 2024-05-02 NOTE — ED Provider Notes (Signed)
 Patient with symptomatic bradycardia 52bpm @1822 . EKG @1853  with VR 44 confirmed by repeat vitals @2159  HR44. Patient alert, oriented but endorses fatigue, xerostomia for several hours denies SOB, CP, dizziness. Patient sent to [facility] for evaluation and treatment.

## 2024-05-02 NOTE — ED Notes (Signed)
 Pt continues to c/o level 7/10 leg pain, pt administered prn tylenol . Pt also reports anxiety, prn atarax  administered. Pt affirms to have eaten lunch.

## 2024-05-02 NOTE — ED Notes (Signed)
 Report called to Damien, RN, charge nurse of [facility].

## 2024-05-02 NOTE — ED Notes (Signed)
 Pt reports to feel 'sleepy'. Pt denies si,hi , and avh- verbal contract for safety provided. Pt reports to have eaten breakfast. Pt c/o level 7/10 bilateral leg pain. Prn naproxen  and robaxin  administered. Pt generally isolative to room, polite and cooperative.

## 2024-05-03 ENCOUNTER — Ambulatory Visit (HOSPITAL_COMMUNITY)

## 2024-05-03 ENCOUNTER — Encounter (HOSPITAL_COMMUNITY): Payer: Self-pay | Admitting: Internal Medicine

## 2024-05-03 DIAGNOSIS — Z823 Family history of stroke: Secondary | ICD-10-CM | POA: Diagnosis not present

## 2024-05-03 DIAGNOSIS — F1020 Alcohol dependence, uncomplicated: Secondary | ICD-10-CM | POA: Diagnosis present

## 2024-05-03 DIAGNOSIS — R001 Bradycardia, unspecified: Secondary | ICD-10-CM

## 2024-05-03 DIAGNOSIS — R5381 Other malaise: Secondary | ICD-10-CM | POA: Diagnosis present

## 2024-05-03 DIAGNOSIS — F1310 Sedative, hypnotic or anxiolytic abuse, uncomplicated: Secondary | ICD-10-CM | POA: Diagnosis present

## 2024-05-03 DIAGNOSIS — R45851 Suicidal ideations: Secondary | ICD-10-CM

## 2024-05-03 DIAGNOSIS — E7800 Pure hypercholesterolemia, unspecified: Secondary | ICD-10-CM | POA: Diagnosis present

## 2024-05-03 DIAGNOSIS — F3181 Bipolar II disorder: Secondary | ICD-10-CM | POA: Diagnosis present

## 2024-05-03 DIAGNOSIS — F1120 Opioid dependence, uncomplicated: Secondary | ICD-10-CM | POA: Diagnosis present

## 2024-05-03 DIAGNOSIS — F332 Major depressive disorder, recurrent severe without psychotic features: Secondary | ICD-10-CM

## 2024-05-03 DIAGNOSIS — G2581 Restless legs syndrome: Secondary | ICD-10-CM | POA: Diagnosis present

## 2024-05-03 DIAGNOSIS — Z87891 Personal history of nicotine dependence: Secondary | ICD-10-CM | POA: Diagnosis not present

## 2024-05-03 DIAGNOSIS — I10 Essential (primary) hypertension: Secondary | ICD-10-CM | POA: Diagnosis present

## 2024-05-03 DIAGNOSIS — F1220 Cannabis dependence, uncomplicated: Secondary | ICD-10-CM | POA: Diagnosis present

## 2024-05-03 DIAGNOSIS — Z888 Allergy status to other drugs, medicaments and biological substances status: Secondary | ICD-10-CM | POA: Diagnosis not present

## 2024-05-03 DIAGNOSIS — Z8249 Family history of ischemic heart disease and other diseases of the circulatory system: Secondary | ICD-10-CM | POA: Diagnosis not present

## 2024-05-03 DIAGNOSIS — Z808 Family history of malignant neoplasm of other organs or systems: Secondary | ICD-10-CM | POA: Diagnosis not present

## 2024-05-03 DIAGNOSIS — Z79899 Other long term (current) drug therapy: Secondary | ICD-10-CM | POA: Diagnosis not present

## 2024-05-03 DIAGNOSIS — F4310 Post-traumatic stress disorder, unspecified: Secondary | ICD-10-CM | POA: Diagnosis present

## 2024-05-03 DIAGNOSIS — N289 Disorder of kidney and ureter, unspecified: Secondary | ICD-10-CM | POA: Diagnosis present

## 2024-05-03 DIAGNOSIS — Z8659 Personal history of other mental and behavioral disorders: Secondary | ICD-10-CM

## 2024-05-03 DIAGNOSIS — F1920 Other psychoactive substance dependence, uncomplicated: Secondary | ICD-10-CM | POA: Diagnosis not present

## 2024-05-03 DIAGNOSIS — Z23 Encounter for immunization: Secondary | ICD-10-CM | POA: Diagnosis not present

## 2024-05-03 DIAGNOSIS — F1420 Cocaine dependence, uncomplicated: Secondary | ICD-10-CM

## 2024-05-03 DIAGNOSIS — F1510 Other stimulant abuse, uncomplicated: Secondary | ICD-10-CM | POA: Diagnosis present

## 2024-05-03 DIAGNOSIS — E559 Vitamin D deficiency, unspecified: Secondary | ICD-10-CM | POA: Diagnosis present

## 2024-05-03 DIAGNOSIS — Z5900 Homelessness unspecified: Secondary | ICD-10-CM | POA: Diagnosis not present

## 2024-05-03 LAB — COMPREHENSIVE METABOLIC PANEL WITH GFR
ALT: 25 U/L (ref 0–44)
AST: 30 U/L (ref 15–41)
Albumin: 2.6 g/dL — ABNORMAL LOW (ref 3.5–5.0)
Alkaline Phosphatase: 46 U/L (ref 38–126)
Anion gap: 9 (ref 5–15)
BUN: 19 mg/dL (ref 6–20)
CO2: 23 mmol/L (ref 22–32)
Calcium: 8.5 mg/dL — ABNORMAL LOW (ref 8.9–10.3)
Chloride: 102 mmol/L (ref 98–111)
Creatinine, Ser: 1.12 mg/dL — ABNORMAL HIGH (ref 0.44–1.00)
GFR, Estimated: 60 mL/min — ABNORMAL LOW (ref >60)
Glucose, Bld: 117 mg/dL — ABNORMAL HIGH (ref 70–99)
Potassium: 3.8 mmol/L (ref 3.5–5.1)
Sodium: 134 mmol/L — ABNORMAL LOW (ref 135–145)
Total Bilirubin: <0.2 mg/dL (ref 0.0–1.2)
Total Protein: 6.2 g/dL — ABNORMAL LOW (ref 6.5–8.1)

## 2024-05-03 LAB — CBC
HCT: 34.9 % — ABNORMAL LOW (ref 36.0–46.0)
Hemoglobin: 11.7 g/dL — ABNORMAL LOW (ref 12.0–15.0)
MCH: 32.4 pg (ref 26.0–34.0)
MCHC: 33.5 g/dL (ref 30.0–36.0)
MCV: 96.7 fL (ref 80.0–100.0)
Platelets: 178 K/uL (ref 150–400)
RBC: 3.61 MIL/uL — ABNORMAL LOW (ref 3.87–5.11)
RDW: 12.9 % (ref 11.5–15.5)
WBC: 3.9 K/uL — ABNORMAL LOW (ref 4.0–10.5)
nRBC: 0 % (ref 0.0–0.2)

## 2024-05-03 LAB — CBC WITH DIFFERENTIAL/PLATELET
Abs Immature Granulocytes: 0.01 K/uL (ref 0.00–0.07)
Basophils Absolute: 0 K/uL (ref 0.0–0.1)
Basophils Relative: 1 %
Eosinophils Absolute: 0.1 K/uL (ref 0.0–0.5)
Eosinophils Relative: 2 %
HCT: 35.5 % — ABNORMAL LOW (ref 36.0–46.0)
Hemoglobin: 11.8 g/dL — ABNORMAL LOW (ref 12.0–15.0)
Immature Granulocytes: 0 %
Lymphocytes Relative: 49 %
Lymphs Abs: 2.2 K/uL (ref 0.7–4.0)
MCH: 32.2 pg (ref 26.0–34.0)
MCHC: 33.2 g/dL (ref 30.0–36.0)
MCV: 96.7 fL (ref 80.0–100.0)
Monocytes Absolute: 0.6 K/uL (ref 0.1–1.0)
Monocytes Relative: 12 %
Neutro Abs: 1.6 K/uL — ABNORMAL LOW (ref 1.7–7.7)
Neutrophils Relative %: 36 %
Platelets: 178 K/uL (ref 150–400)
RBC: 3.67 MIL/uL — ABNORMAL LOW (ref 3.87–5.11)
RDW: 12.8 % (ref 11.5–15.5)
WBC: 4.5 K/uL (ref 4.0–10.5)
nRBC: 0 % (ref 0.0–0.2)

## 2024-05-03 LAB — URINALYSIS, ROUTINE W REFLEX MICROSCOPIC
Bilirubin Urine: NEGATIVE
Glucose, UA: NEGATIVE mg/dL
Hgb urine dipstick: NEGATIVE
Ketones, ur: NEGATIVE mg/dL
Leukocytes,Ua: NEGATIVE
Nitrite: NEGATIVE
Protein, ur: NEGATIVE mg/dL
Specific Gravity, Urine: 1.02 (ref 1.005–1.030)
pH: 7 (ref 5.0–8.0)

## 2024-05-03 LAB — T4, FREE: Free T4: 0.74 ng/dL (ref 0.61–1.12)

## 2024-05-03 LAB — BASIC METABOLIC PANEL WITH GFR
Anion gap: 10 (ref 5–15)
BUN: 20 mg/dL (ref 6–20)
CO2: 23 mmol/L (ref 22–32)
Calcium: 8.7 mg/dL — ABNORMAL LOW (ref 8.9–10.3)
Chloride: 102 mmol/L (ref 98–111)
Creatinine, Ser: 1.15 mg/dL — ABNORMAL HIGH (ref 0.44–1.00)
GFR, Estimated: 58 mL/min — ABNORMAL LOW (ref >60)
Glucose, Bld: 117 mg/dL — ABNORMAL HIGH (ref 70–99)
Potassium: 4.3 mmol/L (ref 3.5–5.1)
Sodium: 135 mmol/L (ref 135–145)

## 2024-05-03 LAB — TROPONIN I (HIGH SENSITIVITY): Troponin I (High Sensitivity): 9 ng/L (ref <18)

## 2024-05-03 LAB — TSH: TSH: 4.468 u[IU]/mL (ref 0.350–4.500)

## 2024-05-03 LAB — RAPID URINE DRUG SCREEN, HOSP PERFORMED
Amphetamines: NOT DETECTED
Barbiturates: NOT DETECTED
Benzodiazepines: NOT DETECTED
Cocaine: POSITIVE — AB
Opiates: NOT DETECTED
Tetrahydrocannabinol: NOT DETECTED

## 2024-05-03 LAB — PHOSPHORUS: Phosphorus: 3.9 mg/dL (ref 2.5–4.6)

## 2024-05-03 LAB — MAGNESIUM
Magnesium: 1.8 mg/dL (ref 1.7–2.4)
Magnesium: 1.8 mg/dL (ref 1.7–2.4)

## 2024-05-03 MED ORDER — GUAIFENESIN 100 MG/5ML PO LIQD: 5.0000 mL | ORAL | Status: DC | PRN

## 2024-05-03 MED ORDER — OXYCODONE HCL 5 MG PO TABS
5.0000 mg | ORAL_TABLET | ORAL | Status: DC | PRN
  Administered 2024-05-03 – 2024-05-04 (×4): 5 mg via ORAL
  Filled 2024-05-03 (×4): qty 1

## 2024-05-03 MED ORDER — NICOTINE 14 MG/24HR TD PT24
14.0000 mg | MEDICATED_PATCH | Freq: Every day | TRANSDERMAL | Status: DC
Start: 2024-05-03 — End: 2024-05-04
  Administered 2024-05-03 – 2024-05-04 (×2): 14 mg via TRANSDERMAL
  Filled 2024-05-03 (×2): qty 1

## 2024-05-03 MED ORDER — IPRATROPIUM-ALBUTEROL 0.5-2.5 (3) MG/3ML IN SOLN: 3.0000 mL | RESPIRATORY_TRACT | Status: DC | PRN

## 2024-05-03 MED ORDER — ACETAMINOPHEN 325 MG PO TABS
650.0000 mg | ORAL_TABLET | Freq: Four times a day (QID) | ORAL | Status: DC | PRN
  Administered 2024-05-03: 650 mg via ORAL
  Filled 2024-05-03: qty 2

## 2024-05-03 MED ORDER — KETOROLAC TROMETHAMINE 15 MG/ML IJ SOLN
15.0000 mg | Freq: Four times a day (QID) | INTRAMUSCULAR | Status: DC | PRN
  Administered 2024-05-03 – 2024-05-04 (×3): 15 mg via INTRAVENOUS
  Filled 2024-05-03 (×3): qty 1

## 2024-05-03 MED ORDER — ENOXAPARIN SODIUM 40 MG/0.4ML IJ SOSY
40.0000 mg | PREFILLED_SYRINGE | INTRAMUSCULAR | Status: DC
  Administered 2024-05-03: 40 mg via SUBCUTANEOUS
  Filled 2024-05-03: qty 0.4

## 2024-05-03 MED ORDER — PNEUMOCOCCAL 20-VAL CONJ VACC 0.5 ML IM SUSY
0.5000 mL | PREFILLED_SYRINGE | INTRAMUSCULAR | Status: AC
  Administered 2024-05-04: 0.5 mL via INTRAMUSCULAR
  Filled 2024-05-03: qty 0.5

## 2024-05-03 MED ORDER — ACETAMINOPHEN 650 MG RE SUPP: 650.0000 mg | Freq: Four times a day (QID) | RECTAL | Status: DC | PRN

## 2024-05-03 MED ORDER — METOPROLOL TARTRATE 5 MG/5ML IV SOLN: 5.0000 mg | INTRAVENOUS | Status: DC | PRN

## 2024-05-03 MED ORDER — NICOTINE POLACRILEX 2 MG MT GUM: 2.0000 mg | CHEWING_GUM | OROMUCOSAL | Status: DC | PRN

## 2024-05-03 MED ORDER — SENNOSIDES-DOCUSATE SODIUM 8.6-50 MG PO TABS: 1.0000 | ORAL_TABLET | Freq: Every evening | ORAL | Status: DC | PRN

## 2024-05-03 MED ORDER — POLYETHYLENE GLYCOL 3350 17 G PO PACK
17.0000 g | PACK | Freq: Every day | ORAL | Status: DC | PRN
  Administered 2024-05-04: 17 g via ORAL
  Filled 2024-05-03: qty 1

## 2024-05-03 MED ORDER — MELATONIN 5 MG PO TABS
5.0000 mg | ORAL_TABLET | Freq: Every evening | ORAL | Status: DC | PRN
  Administered 2024-05-03: 5 mg via ORAL
  Filled 2024-05-03: qty 1

## 2024-05-03 MED ORDER — HYDRALAZINE HCL 20 MG/ML IJ SOLN: 10.0000 mg | INTRAMUSCULAR | Status: DC | PRN

## 2024-05-03 MED ORDER — ONDANSETRON HCL 4 MG/2ML IJ SOLN: 4.0000 mg | Freq: Four times a day (QID) | INTRAMUSCULAR | Status: DC | PRN

## 2024-05-03 MED ORDER — INFLUENZA VIRUS VACC SPLIT PF (FLUZONE) 0.5 ML IM SUSY
0.5000 mL | PREFILLED_SYRINGE | INTRAMUSCULAR | Status: AC
  Administered 2024-05-04: 0.5 mL via INTRAMUSCULAR
  Filled 2024-05-03: qty 0.5

## 2024-05-03 MED ORDER — VITAMIN D 25 MCG (1000 UNIT) PO TABS
1000.0000 [IU] | ORAL_TABLET | Freq: Every day | ORAL | Status: DC
  Administered 2024-05-03 – 2024-05-04 (×2): 1000 [IU] via ORAL
  Filled 2024-05-03 (×2): qty 1

## 2024-05-03 NOTE — ED Notes (Signed)
NP Foust at bedside 

## 2024-05-03 NOTE — Progress Notes (Signed)
CSW added shelter and substance abuse resources to patient's AVS.  Cystal Shannahan, MSW, LCSW Transitions of Care  Clinical Social Worker II [phone]  

## 2024-05-03 NOTE — ED Notes (Signed)
 Phleb stuck twice, got no blood

## 2024-05-03 NOTE — Consult Note (Signed)
 [facility] Psychiatric Consult Initial  Patient Name: .Judy Robles  MRN: [id number]  DOB: 17-Nov-1975  Consult Order details:  Orders (From admission, onward)     Start     Ordered   05/03/24 0838  IP CONSULT TO PSYCHIATRY       Comments: Being admitted by hospitalist for bradycardia; coming from [facility] was there voluntarily for SI, MDD  Ordering Provider: Jurline Rockie CROME, NP  Provider:  (Not yet assigned)  Question Answer Comment  Location [facility]   Reason for Consult? SI, MDD      05/03/24 0838             Mode of Visit: In person    Psychiatry Consult Evaluation  Service Date: May 03, 2024 LOS:  LOS: 0 days  Chief Complaint the patient is a 48 year old female with a documented history of polysubstance abuse, MDD, bipolar type II and PTSD who was recently hospitalized at the [facility] and developed bradycardia.  She was sent to the ED for an evaluation from where she was admitted for stabilization of her bradycardia.  Psych consult was requested to assess the need for continued care.  Primary Psychiatric Diagnoses  Polysubstance abuse and dependence (crack cocaine, cannabinoids, opioid and alcohol) 2.  Major depression, recurrent, history of bipolar disorder 3.  History of PTSD  Assessment  Judy Robles is a 48 y.o. female admitted: Medically for 05/02/2024 10:59 PM for bradycardia. She carries the psychiatric diagnoses of depression and substance use disorder and has a past medical history of bradycardia.   Her current presentation of depression and history of substance use disorder is most consistent with mild to moderate depression and polysubstance use in remission. She meets criteria for outpatient IOP services based on her current presentation..  Current outpatient psychotropic medications include as documented and historically she has had a positive response to these medications. She was fairly compliant with medications prior to admission as  evidenced by self-report. On initial examination, patient alert oriented and cooperative.  She denies any active SI/HI/AVH.SABRA Please see plan below for detailed recommendations.   Diagnoses:  Active Hospital problems: Principal Problem:   Symptomatic bradycardia Active Problems:   Essential hypertension   Restless leg syndrome   Polysubstance abuse (HCC)   Renal insufficiency   Suicidal ideation    Plan   ## Psychiatric Medication Recommendations:  Continue gabapentin  400 mg p.o. 3 times daily Continue trazodone  100 mg at bedtime Avoid opioids for pain  ## Medical Decision Making Capacity: Not specifically addressed in this encounter  ## Further Work-up:  -- None recommended EKG - -- Pertinent labwork reviewed earlier this admission includes: As per the hospitalist   ## Disposition:-- Plan Post Discharge/Psychiatric Care Follow-up resources the patient was advised to attend CD-IOP on Monday, Wednesday and Friday upon discharge.  ## Behavioral / Environmental: - No specific recommendations at this time.     ## Safety and Observation Level:  - Based on my clinical evaluation, I estimate the patient to be at minimal risk of self harm in the current setting. - At this time, we recommend  routine. This decision is based on my review of the chart including patient's history and current presentation, interview of the patient, mental status examination, and consideration of suicide risk including evaluating suicidal ideation, plan, intent, suicidal or self-harm behaviors, risk factors, and protective factors. This judgment is based on our ability to directly address suicide risk, implement suicide prevention strategies, and develop a safety  plan while the patient is in the clinical setting. Please contact our team if there is a concern that risk level has changed.  CSSR Risk Category:C-SSRS RISK CATEGORY: No Risk  Suicide Risk Assessment: Patient has following modifiable risk factors  for suicide: lack of access to outpatient mental health resources, which we are addressing by referring the patient to CD-IOP. Patient has following non-modifiable or demographic risk factors for suicide: psychiatric hospitalization Patient has the following protective factors against suicide: Patient is contracting for safety and has friends who are supportive.  Thank you for this consult request. Recommendations have been communicated to the primary team.  We will sign off at this time.   PAULETTE BEETS, MD       History of Present Illness  Relevant Aspects of [facility] Course:  Admitted on 05/02/2024 for bradycardia. They currently admitting the patient for stabilization.   Patient Report:  The patient is alert oriented cooperative and appeared quite disheveled.  She reports that she slept fair to poor because of pain.  She denies any active SI/HI/AVH.  She reports that she just left the [facility] and does not understand why she is on one-on-one observation.  She has been contracting for safety and was actually planning to go to the CD-IOP prior to being admitted for her bradycardia.  Past Psychiatric History: a documented psychiatric history significant for polysubstance abuse (opioid use, cocaine use, methamphetamine use, benzodiazepine, alcohol use), MDD, bipolar 2, and PTSD.   Past Medical History: a medical history of drug overdose, hypertension and restless leg syndrome.   Family Psychiatric  History: No reported history.    Social History: Patient states that she is homeless but sometimes she stays with her child's father or she stays with a friend. She reports an upcoming court date for larceny on 05/16/24 and states that she is on probation in [place]. She denies outpatient psychiatry or counseling.   Collateral information:  None.  ROS   Psychiatric and Social History  Psychiatric History:  Information collected from patient and medical records and discussion with  the [facility] clinicians.  Prev Dx/Sx: Depression, substance abuse and dependence Current Psych Provider: [facility]/IOP Home Meds (current): As documented Previous Med Trials: Unknown Therapy: Unknown  Prior Psych Hospitalization: Multiple prior hospitalizations. Prior Self Harm: Gives history in the past but none recently. Prior Violence: None  Family Psych History: Unknown Family Hx suicide: Unknown  Social History:  Patient reports that she is homeless and states that she does have some family but they are not supportive.  She is currently on probation.  Additional psychosocial information please refer to above. Access to weapons/lethal means: None  Substance History Significant substance use disorder history with abuse of crack cocaine, marijuana, alcohol and opioids.  Exam Findings  Physical Exam: As documented by the hospitalist Vital Signs:  Temp:  [97.5 F (36.4 C)-97.8 F (36.6 C)] 97.5 F (36.4 C) (09/12 1103) Pulse Rate:  [40-52] 50 (09/12 1103) Resp:  [12-18] 16 (09/12 1103) BP: (112-148)/(51-71) 130/59 (09/12 1103) SpO2:  [96 %-100 %] 100 % (09/12 1103) Blood pressure (!) 130/59, pulse (!) 50, temperature (!) 97.5 F (36.4 C), resp. rate 16, SpO2 100%. There is no height or weight on file to calculate BMI.  Physical Exam  Mental Status Exam: General Appearance: Disheveled  Orientation:  Full (Time, Place, and Person)  Memory:  Immediate;   Fair Recent;   Fair Remote;   Fair  Concentration:  Concentration: Fair and Attention Span: Fair  Recall:  Fair  Attention  Fair  Eye Contact:  Fair  Speech:  Slow  Language:  Good  Volume:  Normal  Mood: Mildly dysthymic  Affect:  Full Range  Thought Process:  Linear  Thought Content:  Negative  Suicidal Thoughts:  No  Homicidal Thoughts:  No  Judgement:  Fair  Insight:  Fair  Psychomotor Activity:  Negative  Akathisia:  Negative  Fund of Knowledge:  Fair      Assets:  Communication Skills Desire for Improvement   Cognition:  WNL  ADL's:  Intact  AIMS (if indicated):        Other History   These have been pulled in through the EMR, reviewed, and updated if appropriate.  Family History:  The patient's family history includes Hypertension in her father; Stroke in her father; Throat cancer in her mother.  Medical History: Past Medical History:  Diagnosis Date   Arthritis    Bipolar 1 disorder (HCC)    Depression    Hypercholesterolemia    Hypertension    MRSA (methicillin resistant Staphylococcus aureus)    Obesity    Renal disease    Sleep apnea     Surgical History: Past Surgical History:  Procedure Laterality Date   CESAREAN SECTION     FRACTURE SURGERY Right    ankle   I & D EXTREMITY Right 04/16/2014   Procedure: IRRIGATION AND DEBRIDEMENT EXTREMITY;  Surgeon: Prentice LELON Pagan, MD;  Location: [facility] OR;  Service: Orthopedics;  Laterality: Right;     Medications:   Current Facility-Administered Medications:    acetaminophen  (TYLENOL ) tablet 650 mg, 650 mg, Oral, Q6H PRN **OR** acetaminophen  (TYLENOL ) suppository 650 mg, 650 mg, Rectal, Q6H PRN, Foust, Katy L, NP   cholecalciferol  (VITAMIN D3) 25 MCG (1000 UNIT) tablet 1,000 Units, 1,000 Units, Oral, Daily, Amin, Ankit C, MD, 1,000 Units at 05/03/24 1103   enoxaparin  (LOVENOX ) injection 40 mg, 40 mg, Subcutaneous, Q24H, Foust, Katy L, NP   guaiFENesin  (ROBITUSSIN) 100 MG/5ML liquid 5 mL, 5 mL, Oral, Q4H PRN, Amin, Ankit C, MD   hydrALAZINE  (APRESOLINE ) injection 10 mg, 10 mg, Intravenous, Q4H PRN, Amin, Ankit C, MD   ipratropium-albuterol  (DUONEB) 0.5-2.5 (3) MG/3ML nebulizer solution 3 mL, 3 mL, Nebulization, Q4H PRN, Amin, Ankit C, MD   ketorolac  (TORADOL ) 15 MG/ML injection 15 mg, 15 mg, Intravenous, Q6H PRN, Foust, Katy L, NP, 15 mg at 05/03/24 0935   melatonin tablet 5 mg, 5 mg, Oral, QHS PRN, Foust, Katy L, NP   metoprolol  tartrate (LOPRESSOR ) injection 5 mg, 5 mg, Intravenous, Q4H PRN, Amin, Ankit C, MD   nicotine  (NICODERM CQ   - dosed in mg/24 hours) patch 14 mg, 14 mg, Transdermal, Daily, Foust, Katy L, NP, 14 mg at 05/03/24 9062   nicotine  polacrilex (NICORETTE ) gum 2 mg, 2 mg, Oral, PRN, Foust, Katy L, NP   ondansetron  (ZOFRAN ) injection 4 mg, 4 mg, Intravenous, Q6H PRN, Amin, Ankit C, MD   polyethylene glycol (MIRALAX  / GLYCOLAX ) packet 17 g, 17 g, Oral, Daily PRN, Foust, Katy L, NP   senna-docusate (Senokot-S) tablet 1 tablet, 1 tablet, Oral, QHS PRN, Amin, Ankit C, MD  Current Outpatient Medications:    gabapentin  (NEURONTIN ) 400 MG capsule, Take 1 capsule (400 mg total) by mouth 3 (three) times daily., Disp: 90 capsule, Rfl: 0   [START ON 05/05/2024] Vitamin D , Ergocalciferol , (DRISDOL ) 1.25 MG (50000 UNIT) CAPS capsule, Take 1 capsule (50,000 Units total) by mouth every 7 (seven) days., Disp: 4 capsule, Rfl:  0  Allergies: Allergies  Allergen Reactions   Gramineae Pollens Itching   Lisinopril  Other (See Comments)    Shuts down kidneys   Mirtazapine  Other (See Comments)    Tremors    PAULETTE BEETS, MD

## 2024-05-03 NOTE — ED Notes (Signed)
 Sitter at bedside.

## 2024-05-03 NOTE — Plan of Care (Signed)

## 2024-05-03 NOTE — Discharge Instructions (Signed)

## 2024-05-03 NOTE — ED Notes (Addendum)
 Staffing called due to need for sitter. Per staffing, sitter is on way to pt's room now.

## 2024-05-03 NOTE — ED Notes (Signed)
 6E notified of patient coming.

## 2024-05-03 NOTE — Consult Note (Signed)
  She has a medical history significant for hypertension, chronic pain, and polysubstance abuse including alcohol, methamphetamines, opiates, benzodiazepines, cocaine, and nicotine , as well as PTSD, bipolar II disorder, restless leg syndrome, and major depressive disorder. She initially presented to [facility] ED after transfer from [facility], where she had been voluntarily admitted from September 6 to September 11 for suicidal ideation, major depressive disorder, and polysubstance dependence. She was offered an additional day but was scheduled for discharge on September 12 with a warm handoff to the chemical dependency intensive outpatient program (CDIOP). At that time, she developed hypotension and bradycardia, with heart rates remaining in the 40s, prompting transfer to the ED. Psychiatry was consulted for this reason. Given that she was just discharged yesterday and had only been held overnight for coordination of her CDIOP handoff, psychiatric readmission is not indicated. There are no acute inpatient psychiatric concerns at this time that would warrant ongoing admission or intervention. Psychiatric services will therefore be discontinued, and the patient should proceed with outpatient CDIOP follow-up as planned.  Psychiatry consult service to sign off at this time.

## 2024-05-03 NOTE — H&P (Addendum)
 History and Physical    Judy Robles FMW:[id number] DOB: 08/08/1976 DOA: 05/02/2024  DOS: the patient was seen and examined on 05/02/2024  PCP: Pcp, No   Patient coming from: [facility]  I have personally briefly reviewed patient's old medical records in [facility]  HPI:  Judy Robles is a 48 y.o. year old female with past medical history of hypertension, chronic pain, polysubstance abuse (EtOH, methamphetamine, opiates, benzodiazepine, cocaine, nicotine ), PTSD, bipolar 2, restless leg syndrome, and major depressive disorder.  She presents to [facility] ED from [facility] where she was admitted voluntarily for SI, major depressive disorder, and polysubstance dependence.  She was due to be discharged from [facility] this morning. While at [facility] yesterday patient was noted to be bradycardic with HR in the 40s and hemodynamically stable.  She was mildly symptomatic endorsing fatigue, xerostomia, and at that time denied dyspnea, chest pain, and dizziness. On my interview this morning patient endorses fatigue, xerostomia, dizziness, weakness and she also continues to endorse depressed mood and passive SI.  Denies chest pain, palpitations, syncope, presyncope, lethargy, confusion, dyspnea, abdominal pain, nausea, vomiting, constipation.   ED Course: On arrival to [facility] ED patient was noted to be afebrile temp 36.5 C, BP 148/51, HR 46, RR 16, SpO2 90% on room air.  Labs notable for hemoglobin 11.8, creatinine 1.15.  TSH was elevated on admission to [facility] at 5.052 and FT4 was normal at 0.91.  UDS positive for cocaine.  EDP discussed case with cardiologist Dr. Cesario, no indication for further cardiology consultation recommended to d/c clonidine , monitor, and call them if any further questions/issues. [facility] contacted for admission.  Review of Systems: As mentioned in the history of present illness. All other systems reviewed and are negative.  Review of Systems   Constitutional:  Positive for malaise/fatigue. Negative for chills and fever.  HENT:  Negative for congestion, sinus pain and sore throat.   Eyes:  Negative for blurred vision and double vision.  Respiratory:  Negative for cough, sputum production, shortness of breath and wheezing.   Cardiovascular:  Negative for chest pain and palpitations.  Gastrointestinal:  Negative for abdominal pain, constipation, diarrhea, nausea and vomiting.  Skin:  Negative for itching and rash.  Neurological:  Positive for dizziness and weakness. Negative for focal weakness and headaches.  Psychiatric/Behavioral:  Positive for depression, substance abuse and suicidal ideas. Negative for hallucinations. The patient is nervous/anxious and has insomnia.     Past Medical History:  Diagnosis Date   Arthritis    Bipolar 1 disorder (HCC)    Depression    Hypercholesterolemia    Hypertension    MRSA (methicillin resistant Staphylococcus aureus)    Obesity    Renal disease    Sleep apnea     Past Surgical History:  Procedure Laterality Date   CESAREAN SECTION     FRACTURE SURGERY Right    ankle   I & D EXTREMITY Right 04/16/2014   Procedure: IRRIGATION AND DEBRIDEMENT EXTREMITY;  Surgeon: Prentice LELON Pagan, MD;  Location: [facility] OR;  Service: Orthopedics;  Laterality: Right;     reports that she has been smoking cigarettes. She has never used smokeless tobacco. She reports current alcohol use. She reports current drug use. Drug: Cocaine.  Allergies  Allergen Reactions   Gramineae Pollens Itching   Lisinopril  Other (See Comments)    Shuts down kidneys   Mirtazapine  Other (See Comments)    Tremors    Family  History  Problem Relation Age of Onset   Throat cancer Mother    Hypertension Father    Stroke Father     Prior to Admission medications   Medication Sig Start Date End Date Taking? Authorizing Provider  gabapentin  (NEURONTIN ) 400 MG capsule Take 1 capsule (400 mg total) by mouth 3 (three) times  daily. 05/02/24   White, Patrice L, NP  Vitamin D , Ergocalciferol , (DRISDOL ) 1.25 MG (50000 UNIT) CAPS capsule Take 1 capsule (50,000 Units total) by mouth every 7 (seven) days. 05/05/24   Teresa Wyline CROME, NP    Physical Exam: Vitals:   05/03/24 0345 05/03/24 0400 05/03/24 0600 05/03/24 0658  BP: 136/71 (!) 141/68 (!) 124/56   Pulse: (!) 43 (!) 45 (!) 42   Resp: 17 13 15    Temp:    97.8 F (36.6 C)  TempSrc:    Oral  SpO2: 100% 96% 100%     Constitutional: Tearful, Middle aged caucasian women lying on ED stretcher in NAD Eyes: PERRL, lids and conjunctivae normal ENMT: Mucous membranes are dry. Posterior pharynx clear of any exudate or lesions.  Neck: normal, supple, no masses, no thyromegaly Respiratory: clear to auscultation bilaterally, no wheezing, no crackles. Normal respiratory effort. No accessory muscle use.  Cardiovascular: Decreased rate and regular rhythm, no murmurs / rubs / gallops. No extremity edema. 2+ radial pulses and 1+ pedal pulses.  Abdomen: no tenderness, no masses palpated. No hepatosplenomegaly. Bowel sounds positive x4 quadrants.  Musculoskeletal: no clubbing / cyanosis. No joint deformity upper and lower extremities. Good ROM, no contractures. Normal muscle tone.  Skin: Warm, dry. No rashes, lesions, ulcers noted Neurologic: CN 2-12 grossly intact. Alert and oriented x 3. Sensation intact. Strength 5/5 x all 4 extremities.  Psychiatric: Fair judgment and insight. Dysphoric mood with congruent affect.    Labs on Admission: I have personally reviewed following labs and imaging studies  CBC: Recent Labs  Lab 04/27/24 1442 05/03/24 0052  WBC 10.4 4.5  NEUTROABS 6.5 1.6*  HGB 12.1 11.8*  HCT 35.8* 35.5*  MCV 94.2 96.7  PLT 213 178   Basic Metabolic Panel: Recent Labs  Lab 04/27/24 1442 04/30/24 2145 05/03/24 0052  NA 133* 136 135  K 3.5 5.3* 4.3  CL 101 103 102  CO2 18* 26 23  GLUCOSE 94 117* 117*  BUN 19 22* 20  CREATININE 1.36* 1.16* 1.15*   CALCIUM  8.7* 8.7* 8.7*  MG 1.6*  --  1.8   GFR: CrCl cannot be calculated (Unknown ideal weight.). Liver Function Tests: Recent Labs  Lab 04/27/24 1442 04/30/24 2145  AST 70* 36  ALT 39 26  ALKPHOS 59 47  BILITOT 0.8 0.4  PROT 7.5 5.7*  ALBUMIN 3.6 2.5*   No results for input(s): LIPASE, AMYLASE in the last 168 hours. No results for input(s): AMMONIA in the last 168 hours. Coagulation Profile: No results for input(s): INR, PROTIME in the last 168 hours. Cardiac Enzymes: Recent Labs  Lab 05/03/24 0052  TROPONINIHS 9   BNP (last 3 results) No results for input(s): BNP in the last 8760 hours. HbA1C: No results for input(s): HGBA1C in the last 72 hours. CBG: Recent Labs  Lab 05/02/24 2153  GLUCAP 125*   Lipid Profile: No results for input(s): CHOL, HDL, LDLCALC, TRIG, CHOLHDL, LDLDIRECT in the last 72 hours. Thyroid  Function Tests: No results for input(s): TSH, T4TOTAL, FREET4, T3FREE, THYROIDAB in the last 72 hours. Anemia Panel: No results for input(s): VITAMINB12, FOLATE, FERRITIN, TIBC, IRON , RETICCTPCT in  the last 72 hours. Urine analysis:    Component Value Date/Time   COLORURINE YELLOW 05/03/2024 0154   APPEARANCEUR CLEAR 05/03/2024 0154   LABSPEC 1.020 05/03/2024 0154   PHURINE 7.0 05/03/2024 0154   GLUCOSEU NEGATIVE 05/03/2024 0154   HGBUR NEGATIVE 05/03/2024 0154   BILIRUBINUR NEGATIVE 05/03/2024 0154   KETONESUR NEGATIVE 05/03/2024 0154   PROTEINUR NEGATIVE 05/03/2024 0154   UROBILINOGEN 0.2 07/03/2015 0618   NITRITE NEGATIVE 05/03/2024 0154   LEUKOCYTESUR NEGATIVE 05/03/2024 0154   Radiological Exams on Admission: I have personally reviewed images No results found.  EKG: My personal interpretation of EKG shows: Sinus bradycardia HR 47, prolonged QT segment.  No other conduction abnormalities noted.  Assessment/Plan: Principal Problem:   Symptomatic bradycardia   Active Medical  Issues: ##Symptomatic Bradycardia - Hold all negative chronotropic agents  - Hold clonidine  - Monitor on telemetry - Replete electrolytes PRN - Check TSH (TSH was elevated with normal FT4 on 04/27/24 at [facility], will recheck today to see if elevation was acute/transient or persistent and worsening)  ##Suicidal Ideation #Major Depressive Disorder Patient [facility] voluntarily for major depressive disorder and suicidal ideation - 1:1 Sitter and Suicide precautions - Psychiatry consulted, appreciate their recommendations and management  ##Renal Insufficiency Creatinine 1.16 with GFR 57 in line with recent baseline CHL -Avoid nephrotoxins, contrast Dyes, Hypotension and Dehydration  - Repeat metabolic panel with a.m. labs  Chronic Medical Issues: #Hypertension - Not taking outpatient medication secondary to cost - Consider addition of ACE/ARB after resolution of symptoms of dizziness/fatigue  #Chronic (R) Leg Pain #Restless Leg Syndrome PDMP reviewed, no active prescriptions listed - Tylenol , Toradol , and non-pharmacologic pain management while inpatient - Trial SCDs PRN - Consider gabapentin  addition after resolution of symptoms of dizziness/fatigue  #Polysubstance Abuse UDS (+) for cocaine, consistent with patient report of crack cocaine use. UDS at [facility] on 9/6 (+) for Buprenorphine , Methamphetamine, and Oxycodone . Patient also with ETOH use. -  Underwent Clonidine  taper at [facility] that ended 9/11 -  Counseled on importance of cessation -  Nicotine  replacement therapy with nicotine  patch and gum while inpatient  VTE prophylaxis:  Lovenox   GI prophylaxis: Not indicated  Diet: Regular Access: PIV Lines: None Code Status:  Full Code Telemetry: Yes  Admission status: Observation, Telemetry bed Patient is from: [facility]  Anticipated d/c is to: Previous home environment  Anticipated d/c date is: 1-2 days Patient currently: Pending  symptomatic improvement and improvement/resolution of bradycardia.    Family Communication: No family at bedside. Plan of care discussed with patient at bedside.  Questions welcomed and elicited.  Questions answered to patient's expressed satisfaction.  Patient is cognitively able to update family independently if desired.  Consults called: Psychiatry   Severity of Illness: The appropriate patient status for this patient is OBSERVATION. Observation status is judged to be reasonable and necessary in order to provide the required intensity of service to ensure the patient's safety. The patient's presenting symptoms, physical exam findings, and initial radiographic and laboratory data in the context of their medical condition is felt to place them at decreased risk for further clinical deterioration. Furthermore, it is anticipated that the patient will be medically stable for discharge from the hospital within 2 midnights of admission.    To reach the provider On-Call:   7AM- 7PM see care teams to locate the attending and reach out to them via [email or web address] Password: [email or web address] 7PM-7AM contact night-coverage If you still have difficulty reaching the  appropriate provider, please page the [facility] (Director on Call) for [facility] on amion for assistance  This document was prepared using Sales executive software and may include unintentional dictation errors.  Rockie Rams FNP-BC, PMHNP-BC Nurse Practitioner [facility]

## 2024-05-04 ENCOUNTER — Other Ambulatory Visit (HOSPITAL_COMMUNITY): Payer: Self-pay

## 2024-05-04 DIAGNOSIS — R001 Bradycardia, unspecified: Secondary | ICD-10-CM | POA: Diagnosis not present

## 2024-05-04 LAB — CBC
HCT: 34 % — ABNORMAL LOW (ref 36.0–46.0)
Hemoglobin: 11.2 g/dL — ABNORMAL LOW (ref 12.0–15.0)
MCH: 32.1 pg (ref 26.0–34.0)
MCHC: 32.9 g/dL (ref 30.0–36.0)
MCV: 97.4 fL (ref 80.0–100.0)
Platelets: 162 K/uL (ref 150–400)
RBC: 3.49 MIL/uL — ABNORMAL LOW (ref 3.87–5.11)
RDW: 12.9 % (ref 11.5–15.5)
WBC: 4.5 K/uL (ref 4.0–10.5)
nRBC: 0 % (ref 0.0–0.2)

## 2024-05-04 LAB — BASIC METABOLIC PANEL WITH GFR
Anion gap: 7 (ref 5–15)
BUN: 24 mg/dL — ABNORMAL HIGH (ref 6–20)
CO2: 26 mmol/L (ref 22–32)
Calcium: 8.7 mg/dL — ABNORMAL LOW (ref 8.9–10.3)
Chloride: 102 mmol/L (ref 98–111)
Creatinine, Ser: 1.34 mg/dL — ABNORMAL HIGH (ref 0.44–1.00)
GFR, Estimated: 48 mL/min — ABNORMAL LOW (ref >60)
Glucose, Bld: 89 mg/dL (ref 70–99)
Potassium: 4.7 mmol/L (ref 3.5–5.1)
Sodium: 135 mmol/L (ref 135–145)

## 2024-05-04 LAB — MAGNESIUM: Magnesium: 1.9 mg/dL (ref 1.7–2.4)

## 2024-05-04 MED ORDER — OXYCODONE HCL 5 MG PO TABS
5.0000 mg | ORAL_TABLET | Freq: Four times a day (QID) | ORAL | 0 refills | Status: DC | PRN
  Filled 2024-05-04: qty 10, 3d supply, fill #0

## 2024-05-04 MED ORDER — SENNA 8.6 MG PO TABS
2.0000 | ORAL_TABLET | Freq: Two times a day (BID) | ORAL | 0 refills | Status: AC | PRN
  Filled 2024-05-04: qty 30, 8d supply, fill #0

## 2024-05-04 MED ORDER — GABAPENTIN 400 MG PO CAPS
400.0000 mg | ORAL_CAPSULE | Freq: Three times a day (TID) | ORAL | 0 refills | Status: AC
  Filled 2024-05-04: qty 15, 5d supply, fill #0

## 2024-05-04 NOTE — Hospital Course (Addendum)
 Brief Narrative:  48 year old female with history of HTN, chronic pain, polysubstance abuse including alcohol, methamphetamine, opioid, cocaine, PTSD, bipolar disorder, RLS, MDD comes to the ED from [facility] where patient was admitted for suicidal ideation. While at the facility developed bradycardia and fatigue therefore sent to the hospital. In the ER heart rate noted to be in 40s showing sinus bradycardia. UDS is positive for cocaine. Cardiology recommended discontinuing clonidine  and monitoring the patient.  Eventually psychiatry cleared the patient for discharge.  Heart rate slowly started improving. Heart rate has improved today and she is ambulating without any symptoms.  Denies any lightheadedness, dizziness, chest pain. She will follow-up outpatient with her primary care physician  Assessment & Plan:   Symptomatic bradycardia, improved - Overall feeling fatigue.  Currently we will hold off on any AV nodal blockers and discontinue clonidine .  EDP discussed case with cardiology who recommended monitoring otherwise no further workup indicated.  Heart rate at the time of discharge is in high 50s without any symptoms. -Normal TSH and free T4   Suicidal ideation/major depressive disorder - Psychiatry consulted.  Cleared for discharge   Renal insufficiency - Baseline creatinine 1.2, now improved and stable.   Vitamin D  deficiency - Supplements started   Essential hypertension - Discontinued clonidine .  In the future can consider starting Norvasc  or hydralazine  if necessary.   Restless leg syndrome - Pain control   Polysubstance abuse - Counseled to quit using it   DVT prophylaxis: Lovenox     Code Status: Full Code Family Communication:   Discharge today   PT Follow up Recs:   Subjective: Feels great no complaints.  Heart rate remains in high 50s.  Ambulating without any issues.   Examination:  General exam: Appears calm and comfortable  Respiratory system:  Clear to auscultation. Respiratory effort normal. Cardiovascular system: S1 & S2 heard, RRR. No JVD, murmurs, rubs, gallops or clicks. No pedal edema. Gastrointestinal system: Abdomen is nondistended, soft and nontender. No organomegaly or masses felt. Normal bowel sounds heard. Central nervous system: Alert and oriented. No focal neurological deficits. Extremities: Symmetric 5 x 5 power. Skin: No rashes, lesions or ulcers Psychiatry: Judgement and insight appear normal. Mood & affect appropriate.

## 2024-05-04 NOTE — Plan of Care (Signed)
  Problem: Education: Goal: Knowledge of General Education information will improve Description: Including pain rating scale, medication(s)/side effects and non-pharmacologic comfort measures Outcome: Adequate for Discharge   Problem: Health Behavior/Discharge Planning: Goal: Ability to manage health-related needs will improve Outcome: Adequate for Discharge   Problem: Clinical Measurements: Goal: Ability to maintain clinical measurements within normal limits will improve Outcome: Adequate for Discharge Goal: Will remain free from infection Outcome: Adequate for Discharge Goal: Diagnostic test results will improve Outcome: Adequate for Discharge Goal: Respiratory complications will improve Outcome: Adequate for Discharge Goal: Cardiovascular complication will be avoided Outcome: Adequate for Discharge   Problem: Activity: Goal: Risk for activity intolerance will decrease Outcome: Adequate for Discharge   Problem: Nutrition: Goal: Adequate nutrition will be maintained Outcome: Adequate for Discharge   Problem: Coping: Goal: Level of anxiety will decrease Outcome: Adequate for Discharge   Problem: Pain Managment: Goal: General experience of comfort will improve and/or be controlled Outcome: Adequate for Discharge   Problem: Safety: Goal: Ability to remain free from injury will improve Outcome: Adequate for Discharge

## 2024-05-04 NOTE — Discharge Summary (Signed)
 Physician Discharge Summary  CRYSTALE GIANNATTASIO FMW:[id number] DOB: [birth year] DOA: 05/02/2024  PCP: Pcp, No  Admit date: 05/02/2024 Discharge date: 05/04/2024  Admitted From: Home Disposition: Home  Recommendations for Outpatient Follow-up:  Follow up with PCP in 1-2 weeks Please obtain BMP/CBC in one week your next doctors visit.  Discontinue clonidine    Discharge Condition: Stable CODE STATUS: Full code Diet recommendation: Heart healthy  Brief/Interim Summary: Brief Narrative:  48 year old female with history of HTN, chronic pain, polysubstance abuse including alcohol, methamphetamine, opioid, cocaine, PTSD, bipolar disorder, RLS, MDD comes to the ED from [facility] where patient was admitted for suicidal ideation. While at the facility developed bradycardia and fatigue therefore sent to the hospital. In the ER heart rate noted to be in 40s showing sinus bradycardia. UDS is positive for cocaine. Cardiology recommended discontinuing clonidine  and monitoring the patient.  Eventually psychiatry cleared the patient for discharge.  Heart rate slowly started improving. Heart rate has improved today and she is ambulating without any symptoms.  Denies any lightheadedness, dizziness, chest pain. She will follow-up outpatient with her primary care physician  Assessment & Plan:   Symptomatic bradycardia, improved - Overall feeling fatigue.  Currently we will hold off on any AV nodal blockers and discontinue clonidine .  EDP discussed case with cardiology who recommended monitoring otherwise no further workup indicated.  Heart rate at the time of discharge is in high 50s without any symptoms. -Normal TSH and free T4   Suicidal ideation/major depressive disorder - Psychiatry consulted.  Cleared for discharge   Renal insufficiency - Baseline creatinine 1.2, now improved and stable.   Vitamin D  deficiency - Supplements started   Essential hypertension - Discontinued clonidine .  In  the future can consider starting Norvasc  or hydralazine  if necessary.   Restless leg syndrome - Pain control   Polysubstance abuse - Counseled to quit using it   DVT prophylaxis: Lovenox     Code Status: Full Code Family Communication:   Discharge today   PT Follow up Recs:   Subjective: Feels great no complaints.  Heart rate remains in high 50s.  Ambulating without any issues.   Examination:  General exam: Appears calm and comfortable  Respiratory system: Clear to auscultation. Respiratory effort normal. Cardiovascular system: S1 & S2 heard, RRR. No JVD, murmurs, rubs, gallops or clicks. No pedal edema. Gastrointestinal system: Abdomen is nondistended, soft and nontender. No organomegaly or masses felt. Normal bowel sounds heard. Central nervous system: Alert and oriented. No focal neurological deficits. Extremities: Symmetric 5 x 5 power. Skin: No rashes, lesions or ulcers Psychiatry: Judgement and insight appear normal. Mood & affect appropriate.    Discharge Diagnoses:  Principal Problem:   Symptomatic bradycardia Active Problems:   Essential hypertension   Restless leg syndrome   Polysubstance abuse (HCC)   Renal insufficiency   Suicidal ideation      Discharge Exam: Vitals:   05/04/24 0531 05/04/24 0742  BP: (!) 111/47 (!) 125/54  Pulse: (!) 50 (!) 55  Resp: 18 18  Temp: 98.1 F (36.7 C) 98 F (36.7 C)  SpO2: 95% 96%   Vitals:   05/03/24 2121 05/04/24 0105 05/04/24 0531 05/04/24 0742  BP: (!) 164/45 (!) 127/92 (!) 111/47 (!) 125/54  Pulse: (!) 57 (!) 54 (!) 50 (!) 55  Resp: 18 18 18 18   Temp:  98.9 F (37.2 C) 98.1 F (36.7 C) 98 F (36.7 C)  TempSrc:  Oral Oral Oral  SpO2: 98% 97% 95% 96%  Weight:  Height:          Discharge Instructions   Allergies as of 05/04/2024       Reactions   Gramineae Pollens Itching   Lisinopril  Other (See Comments)   Shuts down kidneys   Mirtazapine  Other (See Comments)   Tremors         Medication List     TAKE these medications    gabapentin  400 MG capsule Commonly known as: NEURONTIN  Take 1 capsule (400 mg total) by mouth 3 (three) times daily.   oxyCODONE  5 MG immediate release tablet Commonly known as: Oxy IR/ROXICODONE  Take 1 tablet (5 mg total) by mouth every 6 (six) hours as needed for moderate pain (pain score 4-6) or severe pain (pain score 7-10).   senna 8.6 MG Tabs tablet Commonly known as: SENOKOT Take 2 tablets (17.2 mg total) by mouth 2 (two) times daily as needed for mild constipation or moderate constipation.   Vitamin D  (Ergocalciferol ) 1.25 MG (50000 UNIT) Caps capsule Commonly known as: DRISDOL  Take 1 capsule (50,000 Units total) by mouth every 7 (seven) days. Start taking on: May 05, 2024        Allergies  Allergen Reactions   Gramineae Pollens Itching   Lisinopril  Other (See Comments)    Shuts down kidneys   Mirtazapine  Other (See Comments)    Tremors    You were cared for by a hospitalist during your hospital stay. If you have any questions about your discharge medications or the care you received while you were in the hospital after you are discharged, you can call the unit and asked to speak with the hospitalist on call if the hospitalist that took care of you is not available. Once you are discharged, your primary care physician will handle any further medical issues. Please note that no refills for any discharge medications will be authorized once you are discharged, as it is imperative that you return to your primary care physician (or establish a relationship with a primary care physician if you do not have one) for your aftercare needs so that they can reassess your need for medications and monitor your lab values.  You were cared for by a hospitalist during your hospital stay. If you have any questions about your discharge medications or the care you received while you were in the hospital after you are discharged, you can  call the unit and asked to speak with the hospitalist on call if the hospitalist that took care of you is not available. Once you are discharged, your primary care physician will handle any further medical issues. Please note that NO REFILLS for any discharge medications will be authorized once you are discharged, as it is imperative that you return to your primary care physician (or establish a relationship with a primary care physician if you do not have one) for your aftercare needs so that they can reassess your need for medications and monitor your lab values.  Please request your Prim.MD to go over all Hospital Tests and Procedure/Radiological results at the follow up, please get all Hospital records sent to your Prim MD by signing hospital release before you go home.  Get CBC, CMP, 2 view Chest X ray checked  by Primary MD during your next visit or SNF MD in 5-7 days ( we routinely change or add medications that can affect your baseline labs and fluid status, therefore we recommend that you get the mentioned basic workup next visit with your PCP, your PCP may  decide not to get them or add new tests based on their clinical decision)  On your next visit with your primary care physician please Get Medicines reviewed and adjusted.  If you experience worsening of your admission symptoms, develop shortness of breath, life threatening emergency, suicidal or homicidal thoughts you must seek medical attention immediately by calling 911 or calling your MD immediately  if symptoms less severe.  You Must read complete instructions/literature along with all the possible adverse reactions/side effects for all the Medicines you take and that have been prescribed to you. Take any new Medicines after you have completely understood and accpet all the possible adverse reactions/side effects.   Do not drive, operate heavy machinery, perform activities at heights, swimming or participation in water  activities or  provide baby sitting services if your were admitted for syncope or siezures until you have seen by Primary MD or a Neurologist and advised to do so again.  Do not drive when taking Pain medications.   Procedures/Studies: No results found.   The results of significant diagnostics from this hospitalization (including imaging, microbiology, ancillary and laboratory) are listed below for reference.     Microbiology: No results found for this or any previous visit (from the past 240 hours).   Labs: BNP (last 3 results) No results for input(s): BNP in the last 8760 hours. Basic Metabolic Panel: Recent Labs  Lab 04/27/24 1442 04/30/24 2145 05/03/24 0052 05/03/24 0752 05/04/24 0511  NA 133* 136 135 134* 135  K 3.5 5.3* 4.3 3.8 4.7  CL 101 103 102 102 102  CO2 18* 26 23 23 26   GLUCOSE 94 117* 117* 117* 89  BUN 19 22* 20 19 24*  CREATININE 1.36* 1.16* 1.15* 1.12* 1.34*  CALCIUM  8.7* 8.7* 8.7* 8.5* 8.7*  MG 1.6*  --  1.8 1.8 1.9  PHOS  --   --   --  3.9  --    Liver Function Tests: Recent Labs  Lab 04/27/24 1442 04/30/24 2145 05/03/24 0752  AST 70* 36 30  ALT 39 26 25  ALKPHOS 59 47 46  BILITOT 0.8 0.4 <0.2  PROT 7.5 5.7* 6.2*  ALBUMIN 3.6 2.5* 2.6*   No results for input(s): LIPASE, AMYLASE in the last 168 hours. No results for input(s): AMMONIA in the last 168 hours. CBC: Recent Labs  Lab 04/27/24 1442 05/03/24 0052 05/03/24 0752 05/04/24 0511  WBC 10.4 4.5 3.9* 4.5  NEUTROABS 6.5 1.6*  --   --   HGB 12.1 11.8* 11.7* 11.2*  HCT 35.8* 35.5* 34.9* 34.0*  MCV 94.2 96.7 96.7 97.4  PLT 213 178 178 162   Cardiac Enzymes: No results for input(s): CKTOTAL, CKMB, CKMBINDEX, TROPONINI in the last 168 hours. BNP: Invalid input(s): POCBNP CBG: Recent Labs  Lab 05/02/24 2153  GLUCAP 125*   D-Dimer No results for input(s): DDIMER in the last 72 hours. Hgb A1c No results for input(s): HGBA1C in the last 72 hours. Lipid Profile No results  for input(s): CHOL, HDL, LDLCALC, TRIG, CHOLHDL, LDLDIRECT in the last 72 hours. Thyroid  function studies Recent Labs    05/03/24 0752  TSH 4.468   Anemia work up No results for input(s): VITAMINB12, FOLATE, FERRITIN, TIBC, IRON , RETICCTPCT in the last 72 hours. Urinalysis    Component Value Date/Time   COLORURINE YELLOW 05/03/2024 0154   APPEARANCEUR CLEAR 05/03/2024 0154   LABSPEC 1.020 05/03/2024 0154   PHURINE 7.0 05/03/2024 0154   GLUCOSEU NEGATIVE 05/03/2024 0154   HGBUR NEGATIVE 05/03/2024 0154  BILIRUBINUR NEGATIVE 05/03/2024 0154   KETONESUR NEGATIVE 05/03/2024 0154   PROTEINUR NEGATIVE 05/03/2024 0154   UROBILINOGEN 0.2 07/03/2015 0618   NITRITE NEGATIVE 05/03/2024 0154   LEUKOCYTESUR NEGATIVE 05/03/2024 0154   Sepsis Labs Recent Labs  Lab 04/27/24 1442 05/03/24 0052 05/03/24 0752 05/04/24 0511  WBC 10.4 4.5 3.9* 4.5   Microbiology No results found for this or any previous visit (from the past 240 hours).   Time coordinating discharge:  I have spent 35 minutes face to face with the patient and on the ward discussing the patients care, assessment, plan and disposition with other care givers. >50% of the time was devoted counseling the patient about the risks and benefits of treatment/Discharge disposition and coordinating care.   SIGNED:   Burgess JAYSON Dare, MD  [facility] 05/04/2024, 11:40 AM   If 7PM-7AM, please contact night-coverage

## 2024-05-04 NOTE — Evaluation (Signed)
 Occupational Therapy Evaluation Patient Details Name: Judy Robles MRN: [id number] DOB: 1975/11/02 Today's Date: 05/04/2024   History of Present Illness   Pt is a 48 y.o. female presenting 9/11 with bradycardia and fatigue from [facility] where she was admitted for suicidal ideation. UDS positive for cocaine. PMH: HTN, chronic pain, polysubstance abuse including alcohol, methamphetamine, opioid, cocaine, PTSD, bipolar disorder, RLS, MDD     Clinical Impressions PTA, pt reports being unhoused and was independent for BADL, IADL, use of public transportation. Upon eval, pt presents with decreased activity tolerance and BLE aching pain. Pt independent in BADL on evaluation and asking for voucher for bus as well as assist with contacting [facility] for room. TOC/LCSW secure chatted on pt behalf. No further acute OT needs identified.     If plan is discharge home, recommend the following:   Other (comment) (assist with housing, otherwise, assist on pt request)     Functional Status Assessment   Patient has had a recent decline in their functional status and demonstrates the ability to make significant improvements in function in a reasonable and predictable amount of time.     Equipment Recommendations   None recommended by OT     Recommendations for Other Services         Precautions/Restrictions         Mobility Bed Mobility Overal bed mobility: Independent                  Transfers Overall transfer level: Independent                        Balance Overall balance assessment: Modified Independent                                         ADL either performed or assessed with clinical judgement   ADL Overall ADL's : Independent;Modified independent                                       General ADL Comments: increased time for LB ADL due to habitus     Vision Baseline Vision/History: 1  Wears glasses Ability to See in Adequate Light: 0 Adequate Patient Visual Report: No change from baseline (reporting she needs to see an eye doctor) Vision Assessment?: No apparent visual deficits     Perception         Praxis         Pertinent Vitals/Pain Pain Assessment Pain Assessment: Faces Faces Pain Scale: Hurts a little bit Pain Location: BLE Pain Descriptors / Indicators: Aching Pain Intervention(s): Limited activity within patient's tolerance, Monitored during session     Extremity/Trunk Assessment Upper Extremity Assessment Upper Extremity Assessment: Overall WFL for tasks assessed   Lower Extremity Assessment Lower Extremity Assessment: Overall WFL for tasks assessed       Communication Communication Communication: No apparent difficulties   Cognition Arousal: Alert Behavior During Therapy: WFL for tasks assessed/performed Cognition: No apparent impairments                               Following commands: Intact       Cueing  General Comments  Exercises     Shoulder Instructions      Home Living Family/patient expects to be discharged to:: Unsure                                 Additional Comments: pt reports she is unhoused currently.      Prior Functioning/Environment Prior Level of Function : Independent/Modified Independent             Mobility Comments: no AD, no falls ADLs Comments: independent in ADL and IADL    OT Problem List: Decreased activity tolerance;Pain   OT Treatment/Interventions:        OT Goals(Current goals can be found in the care plan section)   Acute Rehab OT Goals Patient Stated Goal: housing OT Goal Formulation: With patient   OT Frequency:       Co-evaluation              AM-PAC OT 6 Clicks Daily Activity     Outcome Measure Help from another person eating meals?: None Help from another person taking care of personal grooming?: None Help from  another person toileting, which includes using toliet, bedpan, or urinal?: None Help from another person bathing (including washing, rinsing, drying)?: None Help from another person to put on and taking off regular upper body clothing?: None Help from another person to put on and taking off regular lower body clothing?: None 6 Click Score: 24   End of Session Nurse Communication: Mobility status  Activity Tolerance: Patient tolerated treatment well Patient left: in bed;with call bell/phone within reach;with nursing/sitter in room  OT Visit Diagnosis: Other (comment);Pain (decr activity tolerance)                Time: 9091-9071 OT Time Calculation (min): 20 min Charges:  OT Evaluation $OT Eval Low Complexity: 1 Low  Elma JONETTA Lebron FREDERICK, OTR/L [facility] Acute Rehabilitation Office: [phone]   Elma JONETTA Lebron 05/04/2024, 9:39 AM

## 2024-05-04 NOTE — Progress Notes (Signed)
 PT Cancellation Note  Patient Details Name: Judy Robles MRN: [id number] DOB: 1975/09/18   Cancelled Treatment:    Reason Eval/Treat Not Completed: PT screened, no needs identified, will sign off, per discussion with OT, pt at baseline and independent with all mobility without DME. No further acute PT needs identified at this time. Thank you for the consult.   Izetta Call, PT, DPT   Acute Rehabilitation Department Office [phone] Secure Chat Communication Preferred   Izetta JULIANNA Call 05/04/2024, 10:31 AM

## 2024-05-06 ENCOUNTER — Ambulatory Visit (HOSPITAL_COMMUNITY)

## 2024-05-06 ENCOUNTER — Encounter (HOSPITAL_COMMUNITY): Payer: Self-pay | Admitting: Licensed Clinical Social Worker

## 2024-05-06 ENCOUNTER — Telehealth (HOSPITAL_COMMUNITY): Payer: Self-pay | Admitting: Licensed Clinical Social Worker

## 2024-05-06 NOTE — Telephone Encounter (Signed)
 As Judy Robles no-showed for her appointment today, his therapist attempts to reach her but is unable to leave a HIPAA compliant voicemail as her voicemail box is full.  So, this therapist sends her no-show correspondence via MyChart.  Zell Maier, MA, LCSW, [facility], LCAS 05/06/2024

## 2024-05-12 ENCOUNTER — Ambulatory Visit (HOSPITAL_COMMUNITY): Admission: EM | Admit: 2024-05-12 | Discharge: 2024-05-12 | Disposition: A | Discharge status: Home / Self Care

## 2024-05-12 DIAGNOSIS — F1910 Other psychoactive substance abuse, uncomplicated: Secondary | ICD-10-CM

## 2024-05-12 DIAGNOSIS — Z765 Malingerer [conscious simulation]: Secondary | ICD-10-CM

## 2024-05-12 DIAGNOSIS — Z5900 Homelessness unspecified: Secondary | ICD-10-CM | POA: Diagnosis not present
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
## 2024-05-12 NOTE — ED Notes (Signed)
 Patient discharged by provider. AVS given and reviewed.

## 2024-05-12 NOTE — Discharge Instructions (Addendum)
 Based on the information you have provided and the presenting issue, outpatient services and resources for have been recommended. It is imperative that you follow through with treatment recommendations within 5-7 days from the time of discharge to mitigate further risk to your safety and mental well-being.   You were referred to the [facility] Outpatient CD-IOP Program. Please call Zell Maier, LCSW at [phone] on Monday, May 12, 2024 to reschedule intake appointment.   Specialized Group Therapy for Substance Abuse If you have a substance abuse disorder (with or without a mental health condition), you may benefit from specialized substance abuse therapy in a group setting. According to discharge survey data, 70 percent of patients completing our chemical dependency intensive outpatient program report fewer symptoms of substance abuse and incidents of relapse.  Under the guidance of a licensed mental health professional, meet with your peers every Monday, Wednesday and Friday from 9 a.m. to 12 p.m. for 6 to 8 weeks to:  Learn about chemical dependency, mental illness and co-occurring disorders. Develop relapse-prevention skills. Set personalized goals with your treatment team. To build on the skills you gain, you can attend Alcoholics Anonymous or Narcotics Anonymous meetings in the evenings and access follow-up care through weekly group meetings with peers. Ongoing support promotes wellness and recovery.  Safety:   The following safety precautions should be taken:   No sharp objects. This includes scissors, razors, scrapers, and putty knives.   Chemicals should be removed and locked up.   Medications should be removed and locked up.   Weapons should be removed and locked up. This includes firearms, knives and instruments that can be used to cause injury.   The patient should abstain from use of illicit substances/drugs and abuse of any medications.  If  symptoms worsen or do not continue to improve or if the patient becomes actively suicidal or homicidal then it is recommended that the patient return to the closest hospital emergency department, the [facility], or call 911 for further evaluation and treatment. National Suicide Prevention Lifeline 1-800-SUICIDE or (418) 220-9314.  About 988 988 offers 24/7 access to trained crisis counselors who can help people experiencing mental health-related distress. People can call or text 988 or chat 988lifeline.org for themselves or if they are worried about a loved one who may need crisis support.

## 2024-05-12 NOTE — ED Provider Notes (Signed)
 Behavioral Health Urgent Care Medical Screening Exam  Patient Name: Judy Robles MRN: [id number] Date of Evaluation: 05/12/24 Chief Complaint:  suicidal thoughts, depression and substance use related to life stress and homelessness. Diagnosis:  Final diagnoses:  Malingering  Homelessness  Substance abuse (HCC)    History of Present illness: Judy Robles is a 48 y.o. female patient with a past psychiatric history significant for PTSD, bipolar 2, polysubstance abuse (opioid, cocaine, methamphetamine) and alcohol use disorder who presented to the [facility] voluntary with complaints of suicidal thoughts, depression and substance use related to life stress and homelessness.  Per chart review, patient was admitted to the [facility] from 04/27/24 to 05/02/24 with similar complaints. Patient was supposed to discharge on 05/03/2024 and follow-up with CD IOP here at the [facility] for mental health and substance abuse treatment. However, patient was sent down to the emergency department on 05/02/2024 due to bradycardia. Patient was admitted medically for observation and discharged on 9/13.This provider and Dr. Cole personally spoke with the psychiatry department Dr. Larina at the [facility] to inform that patient was psychiatrically stable and plan was to discharge patient on 9/12 to follow-up with CD IOP.   On evaluation, patient is alert and oriented x 4. Her thought process is linear. Thought content is positive for passive SI in the context of homelessness and negative for HI/AVH. Objectively, patient does not appear to be responding to internal or external stimuli or acutely psychotic. Her mood is dysphoric and affect is congruent. She has fair eye contact. She is cooperative and cooperative and does not appear to be in acute distress.  On approach, patient states that she needs to get back on her  medication because when she left the hospital she stopped taking her medication and has been feeling depressed and suicidal. She denies a suicide plan and states that she had a thought to walk into traffic but she does not want to kill herself. No history of suicide attempts. She states that she has nowhere to stay and that she has been sleeping at the park and would like somewhere to stay. When asked what happened to her staying with her child's father, she states that she cannot stay there anymore and does not further elaborate. She reports using cocaine once yesterday.  She denies using any other illicit drug recently. She denies recent alcohol use. When asked the reason for her not following up with the CD IOP as previously discussed with this provider during her last admission here at the facility based crisis center, she states that no one at the hospital told her about her appointment when she left. I reassured the patient that I had spoken with the psychiatry department at the hospital and provided them with the date, time and location for the CD IOP intake appointment. She is agreeable calling tomorrow morning, Monday, May 13, 2024 to speak with Zell Console to reschedule an intake appointment. She asked if she can have a bus ticket to go downtown. She states that she does not know where she is going to stay. She would like resources for local shelters in the area. Patient was provided with the contact information to follow-up with CD IOP, resources for shelter and a bus ticket. Safety plan completed prior to discharge.   Plan of care: Upon detailed history, the patient has similar encounters with similar presentations and lack of compliance with psychiatric  evaluation, treatment regimen and follow up care. Patient's suicidality is conditioned on obtaining housing/shelter and the patient appears to have secondary gain due to homelessness. Patient endorses suicidal ideations to obtain housing and  upon achieving placement the patient no longer endorses suicidal ideations as evident by recent admission. Substance abuse is the patient's primary problem and substance use disorders are best managed in alternative settings, such as residential rehab, and CD IOP and patient does not necessitate an inpt psych admission for detoxification at this time and [facility] beds are limited. Mental health and substance abuse resources provided at the time of discharge. Patient does not appear to be at imminent risk of dangerousness to self or others at this time. While future psychiatric events cannot be accurately predicted, the patient does not necessitate nor desire further acute inpatient psychiatric care at this time. The patient does not meet [place]  involuntary commitment criteria at this time. This patient presents with risk factors that includes mental health disorder, passive SI in the context of unable housing, and substance abuse. These are mitigated by protective factors which include lack of active SI/HI, no history of previous suicide attempts, no known access to weapons or firearms, expresses motivation for treatment, no history of violence, sense of responsibility to family and child, and expresses purpose for living.   Flowsheet Row ED to Hosp-Admission (Discharged) from 05/02/2024 in [facility] Most recent reading at 05/02/2024 11:09 PM ED from 04/27/2024 in [facility] Most recent reading at 04/27/2024  3:36 PM ED from 04/27/2024 in [facility] Most recent reading at 04/27/2024  1:15 PM  C-SSRS RISK CATEGORY No Risk High Risk High Risk    Psychiatric Specialty Exam  Presentation  General Appearance:Appropriate for Environment  Eye Contact:Fair  Speech:Clear and Coherent  Speech Volume:Normal  Handedness:Right   Mood and Affect  Mood: Dysphoric  Affect: Congruent   Thought Process  Thought  Processes: Coherent  Descriptions of Associations:Intact  Orientation:Full (Time, Place and Person)  Thought Content:Logical  Diagnosis of Schizophrenia or Schizoaffective disorder in past: No   Hallucinations:None  Ideas of Reference:None  Suicidal Thoughts:No  Homicidal Thoughts:No   Sensorium  Memory: Immediate Fair; Recent Fair; Remote Fair  Judgment: Fair  Insight: Fair   Chartered certified accountant: Fair  Attention Span: Fair  Recall: [employer] of Knowledge: Fair  Language: Fair   Psychomotor Activity  Psychomotor Activity: Normal   Assets  Assets: Manufacturing systems engineer; Desire for Improvement   Sleep  Sleep: Poor   Physical Exam: Physical Exam Cardiovascular:     Rate and Rhythm: Normal rate.  Pulmonary:     Effort: Pulmonary effort is normal.  Musculoskeletal:        General: Normal range of motion.     Cervical back: Normal range of motion.  Neurological:     Mental Status: She is alert and oriented to person, place, and time.    Review of Systems  Constitutional: Negative.   HENT: Negative.    Eyes: Negative.   Respiratory: Negative.    Cardiovascular: Negative.   Gastrointestinal: Negative.   Genitourinary: Negative.   Musculoskeletal: Negative.   Endo/Heme/Allergies: Negative.   Psychiatric/Behavioral:  Positive for substance abuse.    Blood pressure (!) 156/65, pulse 67, temperature 97.9 F (36.6 C), temperature source Oral, resp. rate 18, SpO2 100%. There is no height or weight on file to calculate BMI.  Musculoskeletal: Strength & Muscle Tone: within normal limits Gait &  Station: normal Patient leans: N/A   [facility] MSE Discharge Disposition for Follow up and Recommendations: Based on my evaluation the patient does not appear to have an emergency medical condition and can be discharged with resources and follow up care in outpatient services for Substance Abuse Intensive Outpatient Program  Based on  the information you have provided and the presenting issue, outpatient services and resources for have been recommended. It is imperative that you follow through with treatment recommendations within 5-7 days from the time of discharge to mitigate further risk to your safety and mental well-being.   You were referred to the [facility] Outpatient CD-IOP Program. Please call Zell Maier, LCSW at [phone] on Monday, May 12, 2024 to reschedule intake appointment.   Specialized Group Therapy for Substance Abuse If you have a substance abuse disorder (with or without a mental health condition), you may benefit from specialized substance abuse therapy in a group setting. According to discharge survey data, 70 percent of patients completing our chemical dependency intensive outpatient program report fewer symptoms of substance abuse and incidents of relapse.  Under the guidance of a licensed mental health professional, meet with your peers every Monday, Wednesday and Friday from 9 a.m. to 12 p.m. for 6 to 8 weeks to:  Learn about chemical dependency, mental illness and co-occurring disorders. Develop relapse-prevention skills. Set personalized goals with your treatment team. To build on the skills you gain, you can attend Alcoholics Anonymous or Narcotics Anonymous meetings in the evenings and access follow-up care through weekly group meetings with peers. Ongoing support promotes wellness and recovery.  Safety:   The following safety precautions should be taken:   No sharp objects. This includes scissors, razors, scrapers, and putty knives.   Chemicals should be removed and locked up.   Medications should be removed and locked up.   Weapons should be removed and locked up. This includes firearms, knives and instruments that can be used to cause injury.   The patient should abstain from use of illicit substances/drugs and abuse of any medications.  If symptoms  worsen or do not continue to improve or if the patient becomes actively suicidal or homicidal then it is recommended that the patient return to the closest hospital emergency department, the [facility], or call 911 for further evaluation and treatment. National Suicide Prevention Lifeline 1-800-SUICIDE or (505)289-5175.  About 988 988 offers 24/7 access to trained crisis counselors who can help people experiencing mental health-related distress. People can call or text 988 or chat 988lifeline.org for themselves or if they are worried about a loved one who may need crisis support.   Armstrong Creasy L, NP 05/12/2024, 10:13 AM

## 2024-05-17 ENCOUNTER — Emergency Department (HOSPITAL_COMMUNITY)

## 2024-05-17 ENCOUNTER — Encounter (HOSPITAL_COMMUNITY): Payer: Self-pay | Admitting: Family Medicine

## 2024-05-17 ENCOUNTER — Inpatient Hospital Stay (HOSPITAL_COMMUNITY)
Admission: EM | Admit: 2024-05-17 | Discharge: 2024-05-19 | DRG: 683 | Disposition: A | Discharge status: Home / Self Care | Attending: Internal Medicine | Admitting: Internal Medicine

## 2024-05-17 DIAGNOSIS — R7401 Elevation of levels of liver transaminase levels: Secondary | ICD-10-CM | POA: Diagnosis present

## 2024-05-17 DIAGNOSIS — F1410 Cocaine abuse, uncomplicated: Secondary | ICD-10-CM | POA: Diagnosis present

## 2024-05-17 DIAGNOSIS — R197 Diarrhea, unspecified: Secondary | ICD-10-CM | POA: Diagnosis present

## 2024-05-17 DIAGNOSIS — F419 Anxiety disorder, unspecified: Secondary | ICD-10-CM | POA: Diagnosis present

## 2024-05-17 DIAGNOSIS — Z7151 Drug abuse counseling and surveillance of drug abuser: Secondary | ICD-10-CM

## 2024-05-17 DIAGNOSIS — E860 Dehydration: Secondary | ICD-10-CM | POA: Diagnosis present

## 2024-05-17 DIAGNOSIS — F4310 Post-traumatic stress disorder, unspecified: Secondary | ICD-10-CM | POA: Diagnosis present

## 2024-05-17 DIAGNOSIS — F17210 Nicotine dependence, cigarettes, uncomplicated: Secondary | ICD-10-CM | POA: Diagnosis present

## 2024-05-17 DIAGNOSIS — Z6841 Body Mass Index (BMI) 40.0 and over, adult: Secondary | ICD-10-CM | POA: Diagnosis not present

## 2024-05-17 DIAGNOSIS — Z1152 Encounter for screening for COVID-19: Secondary | ICD-10-CM

## 2024-05-17 DIAGNOSIS — D72829 Elevated white blood cell count, unspecified: Secondary | ICD-10-CM | POA: Diagnosis present

## 2024-05-17 DIAGNOSIS — F1090 Alcohol use, unspecified, uncomplicated: Secondary | ICD-10-CM | POA: Diagnosis not present

## 2024-05-17 DIAGNOSIS — Z823 Family history of stroke: Secondary | ICD-10-CM | POA: Diagnosis not present

## 2024-05-17 DIAGNOSIS — E7800 Pure hypercholesterolemia, unspecified: Secondary | ICD-10-CM | POA: Diagnosis present

## 2024-05-17 DIAGNOSIS — F329 Major depressive disorder, single episode, unspecified: Secondary | ICD-10-CM | POA: Diagnosis present

## 2024-05-17 DIAGNOSIS — Z808 Family history of malignant neoplasm of other organs or systems: Secondary | ICD-10-CM | POA: Diagnosis not present

## 2024-05-17 DIAGNOSIS — N179 Acute kidney failure, unspecified: Principal | ICD-10-CM | POA: Diagnosis present

## 2024-05-17 DIAGNOSIS — Z5900 Homelessness unspecified: Secondary | ICD-10-CM | POA: Diagnosis not present

## 2024-05-17 DIAGNOSIS — F3181 Bipolar II disorder: Secondary | ICD-10-CM | POA: Diagnosis present

## 2024-05-17 DIAGNOSIS — F1310 Sedative, hypnotic or anxiolytic abuse, uncomplicated: Secondary | ICD-10-CM | POA: Diagnosis present

## 2024-05-17 DIAGNOSIS — Z888 Allergy status to other drugs, medicaments and biological substances status: Secondary | ICD-10-CM | POA: Diagnosis not present

## 2024-05-17 DIAGNOSIS — R112 Nausea with vomiting, unspecified: Secondary | ICD-10-CM | POA: Diagnosis not present

## 2024-05-17 DIAGNOSIS — E669 Obesity, unspecified: Secondary | ICD-10-CM | POA: Diagnosis present

## 2024-05-17 DIAGNOSIS — E876 Hypokalemia: Secondary | ICD-10-CM | POA: Diagnosis present

## 2024-05-17 DIAGNOSIS — F1010 Alcohol abuse, uncomplicated: Secondary | ICD-10-CM | POA: Diagnosis present

## 2024-05-17 DIAGNOSIS — I10 Essential (primary) hypertension: Secondary | ICD-10-CM | POA: Diagnosis present

## 2024-05-17 DIAGNOSIS — F1510 Other stimulant abuse, uncomplicated: Secondary | ICD-10-CM | POA: Diagnosis present

## 2024-05-17 DIAGNOSIS — Z79899 Other long term (current) drug therapy: Secondary | ICD-10-CM

## 2024-05-17 DIAGNOSIS — A084 Viral intestinal infection, unspecified: Secondary | ICD-10-CM | POA: Diagnosis present

## 2024-05-17 DIAGNOSIS — Z8249 Family history of ischemic heart disease and other diseases of the circulatory system: Secondary | ICD-10-CM

## 2024-05-17 DIAGNOSIS — Z765 Malingerer [conscious simulation]: Secondary | ICD-10-CM

## 2024-05-17 DIAGNOSIS — F1210 Cannabis abuse, uncomplicated: Secondary | ICD-10-CM | POA: Diagnosis present

## 2024-05-17 LAB — COMPREHENSIVE METABOLIC PANEL WITH GFR
ALT: 82 U/L — ABNORMAL HIGH (ref 0–44)
AST: 116 U/L — ABNORMAL HIGH (ref 15–41)
Albumin: 4.6 g/dL (ref 3.5–5.0)
Alkaline Phosphatase: 71 U/L (ref 38–126)
Anion gap: 19 — ABNORMAL HIGH (ref 5–15)
BUN: 28 mg/dL — ABNORMAL HIGH (ref 6–20)
CO2: 17 mmol/L — ABNORMAL LOW (ref 22–32)
Calcium: 9.7 mg/dL (ref 8.9–10.3)
Chloride: 98 mmol/L (ref 98–111)
Creatinine, Ser: 2.43 mg/dL — ABNORMAL HIGH (ref 0.44–1.00)
GFR, Estimated: 24 mL/min — ABNORMAL LOW (ref >60)
Glucose, Bld: 74 mg/dL (ref 70–99)
Potassium: 3.1 mmol/L — ABNORMAL LOW (ref 3.5–5.1)
Sodium: 134 mmol/L — ABNORMAL LOW (ref 135–145)
Total Bilirubin: 0.7 mg/dL (ref 0.0–1.2)
Total Protein: 8.3 g/dL — ABNORMAL HIGH (ref 6.5–8.1)

## 2024-05-17 LAB — URINALYSIS, ROUTINE W REFLEX MICROSCOPIC
Bilirubin Urine: NEGATIVE
Glucose, UA: NEGATIVE mg/dL
Ketones, ur: NEGATIVE mg/dL
Leukocytes,Ua: NEGATIVE
Nitrite: NEGATIVE
Protein, ur: 100 mg/dL — AB
Specific Gravity, Urine: 1.017 (ref 1.005–1.030)
pH: 5 (ref 5.0–8.0)

## 2024-05-17 LAB — I-STAT CG4 LACTIC ACID, ED: Lactic Acid, Venous: 1.1 mmol/L (ref 0.5–1.9)

## 2024-05-17 LAB — CBC
HCT: 43.8 % (ref 36.0–46.0)
Hemoglobin: 14.4 g/dL (ref 12.0–15.0)
MCH: 31.6 pg (ref 26.0–34.0)
MCHC: 32.9 g/dL (ref 30.0–36.0)
MCV: 96.1 fL (ref 80.0–100.0)
Platelets: 224 K/uL (ref 150–400)
RBC: 4.56 MIL/uL (ref 3.87–5.11)
RDW: 12.8 % (ref 11.5–15.5)
WBC: 14 K/uL — ABNORMAL HIGH (ref 4.0–10.5)
nRBC: 0 % (ref 0.0–0.2)

## 2024-05-17 LAB — HCG, SERUM, QUALITATIVE: Preg, Serum: NEGATIVE

## 2024-05-17 LAB — ETHANOL: Alcohol, Ethyl (B): <15 mg/dL (ref <15)

## 2024-05-17 LAB — LIPASE, BLOOD: Lipase: 29 U/L (ref 11–51)

## 2024-05-17 MED ORDER — FOLIC ACID 1 MG PO TABS
1.0000 mg | ORAL_TABLET | Freq: Every day | ORAL | Status: DC
Start: 2024-05-18 — End: 2024-05-19
  Administered 2024-05-18 – 2024-05-19 (×2): 1 mg via ORAL
  Filled 2024-05-17 (×2): qty 1

## 2024-05-17 MED ORDER — OXYCODONE HCL 5 MG PO TABS
5.0000 mg | ORAL_TABLET | ORAL | Status: DC | PRN
  Administered 2024-05-17: 5 mg via ORAL
  Filled 2024-05-17: qty 1

## 2024-05-17 MED ORDER — ONDANSETRON HCL 4 MG/2ML IJ SOLN
4.0000 mg | Freq: Once | INTRAMUSCULAR | Status: AC | PRN
  Administered 2024-05-17: 4 mg via INTRAVENOUS
  Filled 2024-05-17: qty 2

## 2024-05-17 MED ORDER — THIAMINE HCL 100 MG/ML IJ SOLN
100.0000 mg | Freq: Every day | INTRAMUSCULAR | Status: DC
  Filled 2024-05-17: qty 2

## 2024-05-17 MED ORDER — LORAZEPAM 1 MG PO TABS: 0.0000 mg | ORAL_TABLET | Freq: Four times a day (QID) | ORAL | Status: DC

## 2024-05-17 MED ORDER — LACTATED RINGERS IV SOLN: INTRAVENOUS | Status: DC

## 2024-05-17 MED ORDER — POTASSIUM CHLORIDE 10 MEQ/100ML IV SOLN
10.0000 meq | INTRAVENOUS | Status: AC
  Administered 2024-05-17 – 2024-05-18 (×2): 10 meq via INTRAVENOUS
  Filled 2024-05-17 (×2): qty 100

## 2024-05-17 MED ORDER — FENTANYL CITRATE PF 50 MCG/ML IJ SOSY
25.0000 ug | PREFILLED_SYRINGE | INTRAMUSCULAR | Status: DC | PRN
  Administered 2024-05-18 (×3): 50 ug via INTRAVENOUS
  Filled 2024-05-17 (×3): qty 1

## 2024-05-17 MED ORDER — LORAZEPAM 1 MG PO TABS: 0.0000 mg | ORAL_TABLET | Freq: Two times a day (BID) | ORAL | Status: DC

## 2024-05-17 MED ORDER — LACTATED RINGERS IV BOLUS
2000.0000 mL | Freq: Once | INTRAVENOUS | Status: AC
  Administered 2024-05-17: 2000 mL via INTRAVENOUS

## 2024-05-17 MED ORDER — ADULT MULTIVITAMIN W/MINERALS CH
1.0000 | ORAL_TABLET | Freq: Every day | ORAL | Status: DC
  Administered 2024-05-18 – 2024-05-19 (×2): 1 via ORAL
  Filled 2024-05-17 (×2): qty 1

## 2024-05-17 MED ORDER — THIAMINE MONONITRATE 100 MG PO TABS
100.0000 mg | ORAL_TABLET | Freq: Every day | ORAL | Status: DC
  Administered 2024-05-18 – 2024-05-19 (×2): 100 mg via ORAL
  Filled 2024-05-17 (×2): qty 1

## 2024-05-17 MED ORDER — SENNA 8.6 MG PO TABS: 1.0000 | ORAL_TABLET | Freq: Every day | ORAL | Status: DC | PRN

## 2024-05-17 MED ORDER — SODIUM CHLORIDE 0.9% FLUSH
3.0000 mL | Freq: Two times a day (BID) | INTRAVENOUS | Status: DC
  Administered 2024-05-17 – 2024-05-19 (×4): 3 mL via INTRAVENOUS

## 2024-05-17 MED ORDER — LORAZEPAM 2 MG/ML IJ SOLN: 1.0000 mg | INTRAMUSCULAR | Status: DC | PRN

## 2024-05-17 MED ORDER — ACETAMINOPHEN 325 MG PO TABS
325.0000 mg | ORAL_TABLET | Freq: Four times a day (QID) | ORAL | Status: DC | PRN
  Administered 2024-05-18: 325 mg via ORAL
  Filled 2024-05-17: qty 1

## 2024-05-17 MED ORDER — ONDANSETRON HCL 4 MG/2ML IJ SOLN: 4.0000 mg | Freq: Four times a day (QID) | INTRAMUSCULAR | Status: DC | PRN

## 2024-05-17 MED ORDER — HEPARIN SODIUM (PORCINE) 5000 UNIT/ML IJ SOLN
5000.0000 [IU] | Freq: Three times a day (TID) | INTRAMUSCULAR | Status: DC
  Administered 2024-05-18 – 2024-05-19 (×4): 5000 [IU] via SUBCUTANEOUS
  Filled 2024-05-17 (×4): qty 1

## 2024-05-17 MED ORDER — LORAZEPAM 1 MG PO TABS: 1.0000 mg | ORAL_TABLET | ORAL | Status: DC | PRN

## 2024-05-17 MED ORDER — ONDANSETRON HCL 4 MG PO TABS
4.0000 mg | ORAL_TABLET | Freq: Four times a day (QID) | ORAL | Status: DC | PRN
  Administered 2024-05-17: 4 mg via ORAL
  Filled 2024-05-17: qty 1

## 2024-05-17 MED ORDER — PROCHLORPERAZINE EDISYLATE 10 MG/2ML IJ SOLN: 5.0000 mg | Freq: Four times a day (QID) | INTRAMUSCULAR | Status: DC | PRN

## 2024-05-17 MED ORDER — FENTANYL CITRATE PF 50 MCG/ML IJ SOSY
50.0000 ug | PREFILLED_SYRINGE | Freq: Once | INTRAMUSCULAR | Status: AC
  Administered 2024-05-17: 50 ug via INTRAVENOUS
  Filled 2024-05-17: qty 1

## 2024-05-17 NOTE — Discharge Instructions (Addendum)
 Outpatient Substance Use Treatment Services    [facility]  Chemical Dependence Intensive Outpatient Program [address] [place], [place] [address]  [phone] Private insurance, Medicare A&B, and [facility]    ADS (Alcohol and Drug Services)  [address]  [place], [place] [address] [phone] Medicaid, Self Pay    [facility]                                                 [address]  [place], [place] [phone] Medicaid and [facility], Self Pay    [facility] [address]  [place], [place]  [phone] [facility], and Self Pay   [facility]                                               [address]                 [place], [place] [address]  [phone] or [phone] Private Insurance Only    FOOD [employer]TRY [employer] [address] [phone]  [employer] [address] [place] [phone]  [facility] - Food Distribution Center [address] [place] [phone]  [employer] [address] [phone]  [facility] - Food Distribution Center [address] [place], [place] [address] [phone]

## 2024-05-17 NOTE — ED Triage Notes (Signed)
 BIB EMS from home for N/V/D. Does not think she's in wathdrawal - stopped alcohol use 4 days ago, symptoms started 2 days ago. Would like inpatient rehab/detox. 110/60 94 99%

## 2024-05-17 NOTE — ED Provider Notes (Signed)
 [place] EMERGENCY DEPARTMENT AT [facility] Provider Note   CSN: [id number] Arrival date & time: 05/17/24  1815     Patient presents with: Abdominal Pain   Judy Robles is a 48 y.o. female.   48 year old female presents today for concern of nausea, vomiting, diarrhea.  This started yesterday.  She does have history of heavy alcohol use.  Last drink was 4 days ago.  She states she has been drinking heavily for the past 2 months where she has been drinking about 2 Loco's a day which are about 16 ounces each.  She states she has been on quite a while without drinking prior to the past 2 months.  She does not feel like she is in withdrawals.  Endorses generalized pain, and difficulty tolerating p.o. intake.  She has had innumerable amount of throwing up as well as diarrhea.  The history is provided by the patient. No language interpreter was used.       Prior to Admission medications   Medication Sig Start Date End Date Taking? Authorizing Provider  gabapentin  (NEURONTIN ) 400 MG capsule Take 1 capsule (400 mg total) by mouth 3 (three) times daily. 05/04/24   Amin, Ankit C, MD  oxyCODONE  (OXY IR/ROXICODONE ) 5 MG immediate release tablet Take 1 tablet (5 mg total) by mouth every 6 (six) hours as needed for moderate pain (pain score 4-6) or severe pain (pain score 7-10). 05/04/24   Amin, Ankit C, MD  senna (SENOKOT) 8.6 MG TABS tablet Take 2 tablets (17.2 mg total) by mouth 2 (two) times daily as needed for mild constipation or moderate constipation. 05/04/24   Amin, Ankit C, MD  Vitamin D , Ergocalciferol , (DRISDOL ) 1.25 MG (50000 UNIT) CAPS capsule Take 1 capsule (50,000 Units total) by mouth every 7 (seven) days. 05/05/24   White, Patrice L, NP    Allergies: Gramineae pollens, Lisinopril , and Mirtazapine     Review of Systems  Constitutional:  Negative for fever and unexpected weight change.  Respiratory:  Negative for shortness of breath.   Cardiovascular:  Negative for chest  pain.  Gastrointestinal:  Positive for abdominal pain, diarrhea, nausea and vomiting.  Genitourinary:  Negative for dysuria.  Neurological:  Negative for light-headedness.  All other systems reviewed and are negative.   Updated Vital Signs BP 139/80 (BP Location: Right Arm)   Pulse 67   Temp 97.6 F (36.4 C) (Oral)   Resp 18   Ht 5' 1 (1.549 m)   Wt 108.9 kg   SpO2 100%   BMI 45.35 kg/m   Physical Exam Vitals and nursing note reviewed.  Constitutional:      General: She is not in acute distress.    Appearance: Normal appearance. She is not ill-appearing.  HENT:     Head: Normocephalic and atraumatic.     Nose: Nose normal.  Eyes:     Conjunctiva/sclera: Conjunctivae normal.  Pulmonary:     Effort: Pulmonary effort is normal. No respiratory distress.  Musculoskeletal:        General: No deformity.  Skin:    Findings: No rash.  Neurological:     Mental Status: She is alert.     (all labs ordered are listed, but only abnormal results are displayed) Labs Reviewed  COMPREHENSIVE METABOLIC PANEL WITH GFR - Abnormal; Notable for the following components:      Result Value   Sodium 134 (*)    Potassium 3.1 (*)    CO2 17 (*)    BUN 28 (*)  Creatinine, Ser 2.43 (*)    Total Protein 8.3 (*)    AST 116 (*)    ALT 82 (*)    GFR, Estimated 24 (*)    Anion gap 19 (*)    All other components within normal limits  CBC - Abnormal; Notable for the following components:   WBC 14.0 (*)    All other components within normal limits  RESP PANEL BY RT-PCR (RSV, FLU A&B, COVID)  RVPGX2  LIPASE, BLOOD  HCG, SERUM, QUALITATIVE  URINALYSIS, ROUTINE W REFLEX MICROSCOPIC    EKG: None  Radiology: No results found.   .Critical Care  Performed by: Hildegard Loge, PA-C Authorized by: Hildegard Loge, PA-C   Critical care provider statement:    Critical care time (minutes):  30   Critical care was necessary to treat or prevent imminent or life-threatening deterioration of the  following conditions:  Metabolic crisis   Critical care was time spent personally by me on the following activities:  Development of treatment plan with patient or surrogate, discussions with consultants, evaluation of patient's response to treatment, examination of patient, ordering and review of laboratory studies, ordering and review of radiographic studies, ordering and performing treatments and interventions, pulse oximetry, re-evaluation of patient's condition and review of old charts   Care discussed with: admitting provider      Medications Ordered in the ED  ondansetron  (ZOFRAN ) injection 4 mg (4 mg Intravenous Given 05/17/24 1836)  lactated ringers  bolus 2,000 mL (2,000 mLs Intravenous New Bag/Given 05/17/24 2016)  fentaNYL  (SUBLIMAZE ) injection 50 mcg (50 mcg Intravenous Given 05/17/24 2016)                                    Medical Decision Making Amount and/or Complexity of Data Reviewed Labs: ordered. Radiology: ordered.  Risk Prescription drug management.   Medical Decision Making / ED Course   This patient presents to the ED for concern of nausea, vomiting, diarrhea, this involves an extensive number of treatment options, and is a complaint that carries with it a high risk of complications and morbidity.  The differential diagnosis includes gastroenteritis, colitis,   MDM: 48 year old female presents today for concern of nausea, vomiting, diarrhea that started yesterday.  Denies hematemesis, metabolic stools.  No blood in stool.  Symptoms started last night.  She does drink heavily but says her last drink was 4 days ago.  States she has been drinking heavily for the past couple months.  Does not feel she is having any withdrawal symptoms.   CBC with leukocytosis of 14, no anemia.  CMP with potassium 3.1, creatinine of 2.43, gap of 19.  Mild elevations in LFTs otherwise no acute concern.  2 L fluid bolus ordered.  CT without contrast with no obstruction or other acute  concern.  UA shows some white blood cells but she is denying any UTI symptoms.  Will not treat.  Discussed with hospitalist.  They will evaluate patient for admission.  Lab Tests: -I ordered, reviewed, and interpreted labs.   The pertinent results include:   Labs Reviewed  COMPREHENSIVE METABOLIC PANEL WITH GFR - Abnormal; Notable for the following components:      Result Value   Sodium 134 (*)    Potassium 3.1 (*)    CO2 17 (*)    BUN 28 (*)    Creatinine, Ser 2.43 (*)    Total Protein 8.3 (*)  AST 116 (*)    ALT 82 (*)    GFR, Estimated 24 (*)    Anion gap 19 (*)    All other components within normal limits  CBC - Abnormal; Notable for the following components:   WBC 14.0 (*)    All other components within normal limits  URINALYSIS, ROUTINE W REFLEX MICROSCOPIC - Abnormal; Notable for the following components:   APPearance CLOUDY (*)    Hgb urine dipstick SMALL (*)    Protein, ur 100 (*)    Bacteria, UA RARE (*)    All other components within normal limits  RESP PANEL BY RT-PCR (RSV, FLU A&B, COVID)  RVPGX2  LIPASE, BLOOD  HCG, SERUM, QUALITATIVE  ETHANOL  I-STAT CG4 LACTIC ACID, ED      EKG  EKG Interpretation Date/Time:    Ventricular Rate:    PR Interval:    QRS Duration:    QT Interval:    QTC Calculation:   R Axis:      Text Interpretation:           Imaging Studies ordered: I ordered imaging studies including ct abdomen wo contrast I independently visualized and interpreted imaging. I agree with the radiologist interpretation   Medicines ordered and prescription drug management: Meds ordered this encounter  Medications   ondansetron  (ZOFRAN ) injection 4 mg   lactated ringers  bolus 2,000 mL   fentaNYL  (SUBLIMAZE ) injection 50 mcg    -I have reviewed the patients home medicines and have made adjustments as needed  Critical interventions Fluids, potassium repletion  Cardiac Monitoring: The patient was maintained on a cardiac monitor.   I personally viewed and interpreted the cardiac monitored which showed an underlying rhythm of: NSR  Reevaluation: After the interventions noted above, I reevaluated the patient and found that they have :improved  Co morbidities that complicate the patient evaluation  Past Medical History:  Diagnosis Date   Arthritis    Bipolar 1 disorder (HCC)    Depression    Hypercholesterolemia    Hypertension    MRSA (methicillin resistant Staphylococcus aureus)    Obesity    Renal disease    Sleep apnea       Dispostion: Discussed with hospitalist.  They will evaluate patient for admission.    Final diagnoses:  None    ED Discharge Orders     None          Hildegard Loge, DEVONNA 05/17/24 2220    Dasie Faden, MD 05/21/24 [phone]

## 2024-05-17 NOTE — ED Notes (Signed)
 Patient transported to CT

## 2024-05-17 NOTE — ED Notes (Signed)
Pt given chicken broth and crackers. 

## 2024-05-17 NOTE — H&P (Signed)
 History and Physical    Judy Robles FMW:[id number] DOB: 11-27-1975 DOA: 05/17/2024  PCP: Pcp, No   Patient coming from: Home   Chief Complaint: Abdominal pain, N/V/D   HPI: Judy Robles is a 48 y.o. female with medical history significant for PTSD, depression, bipolar disorder, and polysubstance abuse who presents with abdominal pain, nausea, vomiting, and diarrhea.  Patient reports that she has been trying to avoid alcohol and illicit substances, last consumed alcohol 4 days ago, and then developed generalized abdominal pain with nausea and many episodes of nonbloody vomiting yesterday.  She also went on to develop diarrhea last night.  She denies fever, chills, recent travel, or sick contacts.  She denies hematemesis, melena, or hematochezia.  ED Course: Upon arrival to the ED, patient is found to be afebrile and saturating well on room air with normal HR and stable BP.  Labs are most notable for potassium 3.1, bicarbonate 17, creatinine 2.43, anion gap 19, AST 116, ALT 82, normal bilirubin, normal lipase, and WBC 14,000.  There are no acute findings on CT of the abdomen and pelvis.  Patient was given 2 L LR, Zofran , fentanyl , and IV potassium.  Review of Systems:  All other systems reviewed and apart from HPI, are negative.  Past Medical History:  Diagnosis Date   Arthritis    Bipolar 1 disorder (HCC)    Depression    Hypercholesterolemia    Hypertension    MRSA (methicillin resistant Staphylococcus aureus)    Obesity    Renal disease    Sleep apnea     Past Surgical History:  Procedure Laterality Date   CESAREAN SECTION     FRACTURE SURGERY Right    ankle   I & D EXTREMITY Right 04/16/2014   Procedure: IRRIGATION AND DEBRIDEMENT EXTREMITY;  Surgeon: Prentice LELON Pagan, MD;  Location: [facility] OR;  Service: Orthopedics;  Laterality: Right;    Social History:   reports that she has been smoking cigarettes. She has never used smokeless tobacco. She reports current alcohol use.  She reports current drug use. Drug: Cocaine.  Allergies  Allergen Reactions   Gramineae Pollens Itching   Lisinopril  Other (See Comments)    Shuts down kidneys   Mirtazapine  Other (See Comments)    Tremors    Family History  Problem Relation Age of Onset   Throat cancer Mother    Hypertension Father    Stroke Father      Prior to Admission medications   Medication Sig Start Date End Date Taking? Authorizing Provider  gabapentin  (NEURONTIN ) 400 MG capsule Take 1 capsule (400 mg total) by mouth 3 (three) times daily. 05/04/24   Amin, Ankit C, MD  oxyCODONE  (OXY IR/ROXICODONE ) 5 MG immediate release tablet Take 1 tablet (5 mg total) by mouth every 6 (six) hours as needed for moderate pain (pain score 4-6) or severe pain (pain score 7-10). 05/04/24   Amin, Ankit C, MD  senna (SENOKOT) 8.6 MG TABS tablet Take 2 tablets (17.2 mg total) by mouth 2 (two) times daily as needed for mild constipation or moderate constipation. 05/04/24   Amin, Ankit C, MD  Vitamin D , Ergocalciferol , (DRISDOL ) 1.25 MG (50000 UNIT) CAPS capsule Take 1 capsule (50,000 Units total) by mouth every 7 (seven) days. 05/05/24   Teresa Wyline CROME, NP    Physical Exam: Vitals:   05/17/24 1819 05/17/24 2215  BP: 139/80 114/83  Pulse: 67 63  Resp: 18   Temp: 97.6 F (36.4 C)   TempSrc:  Oral   SpO2: 100% 98%  Weight: 108.9 kg   Height: 5' 1 (1.549 m)     Constitutional: NAD, no pallor or diaphoresis   Eyes: PERTLA, lids and conjunctivae normal ENMT: Mucous membranes are moist. Posterior pharynx clear of any exudate or lesions.   Neck: supple, no masses  Respiratory: no wheezing, no crackles. No accessory muscle use.  Cardiovascular: S1 & S2 heard, regular rate and rhythm. No JVD. Abdomen: Soft, no guarding. Bowel sounds active.  Musculoskeletal: no clubbing / cyanosis. No joint deformity upper and lower extremities.   Skin: no significant rashes, lesions, ulcers. Warm, dry, well-perfused. Neurologic: CN 2-12  grossly intact. Moving all extremmities. Alert and oriented.  Psychiatric: Calm. Cooperative.    Labs and Imaging on Admission: I have personally reviewed following labs and imaging studies  CBC: Recent Labs  Lab 05/17/24 1836  WBC 14.0*  HGB 14.4  HCT 43.8  MCV 96.1  PLT 224   Basic Metabolic Panel: Recent Labs  Lab 05/17/24 1836  NA 134*  K 3.1*  CL 98  CO2 17*  GLUCOSE 74  BUN 28*  CREATININE 2.43*  CALCIUM  9.7   GFR: Estimated Creatinine Clearance: 31.6 mL/min (A) (by C-G formula based on SCr of 2.43 mg/dL (H)). Liver Function Tests: Recent Labs  Lab 05/17/24 1836  AST 116*  ALT 82*  ALKPHOS 71  BILITOT 0.7  PROT 8.3*  ALBUMIN 4.6   Recent Labs  Lab 05/17/24 1836  LIPASE 29   No results for input(s): AMMONIA in the last 168 hours. Coagulation Profile: No results for input(s): INR, PROTIME in the last 168 hours. Cardiac Enzymes: No results for input(s): CKTOTAL, CKMB, CKMBINDEX, TROPONINI in the last 168 hours. BNP (last 3 results) No results for input(s): PROBNP in the last 8760 hours. HbA1C: No results for input(s): HGBA1C in the last 72 hours. CBG: No results for input(s): GLUCAP in the last 168 hours. Lipid Profile: No results for input(s): CHOL, HDL, LDLCALC, TRIG, CHOLHDL, LDLDIRECT in the last 72 hours. Thyroid  Function Tests: No results for input(s): TSH, T4TOTAL, FREET4, T3FREE, THYROIDAB in the last 72 hours. Anemia Panel: No results for input(s): VITAMINB12, FOLATE, FERRITIN, TIBC, IRON , RETICCTPCT in the last 72 hours. Urine analysis:    Component Value Date/Time   COLORURINE YELLOW 05/17/2024 2012   APPEARANCEUR CLOUDY (A) 05/17/2024 2012   LABSPEC 1.017 05/17/2024 2012   PHURINE 5.0 05/17/2024 2012   GLUCOSEU NEGATIVE 05/17/2024 2012   HGBUR SMALL (A) 05/17/2024 2012   BILIRUBINUR NEGATIVE 05/17/2024 2012   KETONESUR NEGATIVE 05/17/2024 2012   PROTEINUR 100 (A) 05/17/2024  2012   UROBILINOGEN 0.2 07/03/2015 0618   NITRITE NEGATIVE 05/17/2024 2012   LEUKOCYTESUR NEGATIVE 05/17/2024 2012   Sepsis Labs: @LABRCNTIP (procalcitonin:4,lacticidven:4) )No results found for this or any previous visit (from the past 240 hours).   Radiological Exams on Admission: CT ABDOMEN PELVIS WO CONTRAST Result Date: 05/17/2024 EXAM: CT ABDOMEN AND PELVIS WITHOUT CONTRAST 05/17/2024 08:40:37 PM TECHNIQUE: CT of the abdomen and pelvis was performed without the administration of intravenous contrast. Multiplanar reformatted images are provided for review. Automated exposure control, iterative reconstruction, and/or weight-based adjustment of the mA/kV was utilized to reduce the radiation dose to as low as reasonably achievable. COMPARISON: CT abdomen and pelvis 11/19/2017. CLINICAL HISTORY: Abdominal pain, acute, nonlocalized. FINDINGS: LOWER CHEST: No acute abnormality. LIVER: No acute abnormality. GALLBLADDER AND BILE DUCTS: Gallbladder is unremarkable. No biliary ductal dilatation. SPLEEN: No acute abnormality. PANCREAS: No acute abnormality. ADRENAL GLANDS: No acute  abnormality. KIDNEYS, URETERS AND BLADDER: No stones in the kidneys or ureters. No hydronephrosis. No perinephric or periureteral stranding. Urinary bladder is unremarkable. GI AND BOWEL: Stomach demonstrates no acute abnormality. There is no bowel obstruction. Normal appendix. PERITONEUM AND RETROPERITONEUM: No ascites. No free air. VASCULATURE: Aorta is normal in caliber. Aortic atherosclerotic calcification. LYMPH NODES: No lymphadenopathy. REPRODUCTIVE ORGANS: No acute abnormality. BONES AND SOFT TISSUES: No acute osseous abnormality. No focal soft tissue abnormality. IMPRESSION: 1. No acute findings in the abdomen or pelvis. Electronically signed by: Norman Gatlin MD 05/17/2024 09:00 PM EDT RP Workstation: [id number]    EKG: Independently reviewed. Sinus rhythm.   Assessment/Plan   1. AKI  - No hydronephrosis on CT in  ED, likely prerenal in setting of N/V/D  - Continue IVF hydration, renally-dose medications, repeat chem panel in am    2. Abdominal pain with N/V/D  - No acute findings on CT  - Check C diff and stool pathogen panel, continue IVF, monitor/correct electrolytes, trial clears and advance diet as tolerated   3. Alcohol abuse; polysubstance abuse  - Currently trying to quit, last drink 4 days ago  - Does not appear to be withdrawing, will continue to monitor, supplement vitamins, and consult TOC    4. Elevated transaminases  - No acute hepatobiliary finding on CT; bilirubin and lipase are normal  - Repeat LFTs in am   5. PTSD, depression, bipolar disorder  - Outpatient follow-up advised     DVT prophylaxis: sq heparin   Code Status: Full  Level of Care: Level of care: Telemetry Family Communication: None present  Disposition Plan:  Patient is from: home  Anticipated d/c is to: TBD Anticipated d/c date is: 05/20/24  Patient currently: Pending improved renal function, tolerance of adequate oral intake  Consults called: None  Admission status: Inpatient     Evalene GORMAN Sprinkles, MD [facility]  05/17/2024, 10:38 PM

## 2024-05-18 ENCOUNTER — Other Ambulatory Visit: Payer: Self-pay

## 2024-05-18 DIAGNOSIS — N179 Acute kidney failure, unspecified: Secondary | ICD-10-CM | POA: Diagnosis not present

## 2024-05-18 LAB — CBC
HCT: 37.6 % (ref 36.0–46.0)
Hemoglobin: 12.5 g/dL (ref 12.0–15.0)
MCH: 32.6 pg (ref 26.0–34.0)
MCHC: 33.2 g/dL (ref 30.0–36.0)
MCV: 98.2 fL (ref 80.0–100.0)
Platelets: 157 K/uL (ref 150–400)
RBC: 3.83 MIL/uL — ABNORMAL LOW (ref 3.87–5.11)
RDW: 12.9 % (ref 11.5–15.5)
WBC: 10.9 K/uL — ABNORMAL HIGH (ref 4.0–10.5)
nRBC: 0 % (ref 0.0–0.2)

## 2024-05-18 LAB — COMPREHENSIVE METABOLIC PANEL WITH GFR
ALT: 62 U/L — ABNORMAL HIGH (ref 0–44)
AST: 84 U/L — ABNORMAL HIGH (ref 15–41)
Albumin: 3.8 g/dL (ref 3.5–5.0)
Alkaline Phosphatase: 47 U/L (ref 38–126)
Anion gap: 12 (ref 5–15)
BUN: 20 mg/dL (ref 6–20)
CO2: 18 mmol/L — ABNORMAL LOW (ref 22–32)
Calcium: 9.1 mg/dL (ref 8.9–10.3)
Chloride: 106 mmol/L (ref 98–111)
Creatinine, Ser: 1.73 mg/dL — ABNORMAL HIGH (ref 0.44–1.00)
GFR, Estimated: 35 mL/min — ABNORMAL LOW
Glucose, Bld: 107 mg/dL — ABNORMAL HIGH (ref 70–99)
Potassium: 3.5 mmol/L (ref 3.5–5.1)
Sodium: 136 mmol/L (ref 135–145)
Total Bilirubin: 0.4 mg/dL (ref 0.0–1.2)
Total Protein: 6.7 g/dL (ref 6.5–8.1)

## 2024-05-18 LAB — HEPATITIS PANEL, ACUTE
HCV Ab: REACTIVE — AB
Hep A IgM: NONREACTIVE
Hep B C IgM: NONREACTIVE
Hepatitis B Surface Ag: NONREACTIVE

## 2024-05-18 LAB — RESP PANEL BY RT-PCR (RSV, FLU A&B, COVID)  RVPGX2
Influenza A by PCR: NEGATIVE
Influenza B by PCR: NEGATIVE
Resp Syncytial Virus by PCR: NEGATIVE
SARS Coronavirus 2 by RT PCR: NEGATIVE

## 2024-05-18 LAB — MAGNESIUM: Magnesium: 2.3 mg/dL (ref 1.7–2.4)

## 2024-05-18 MED ORDER — ORAL CARE MOUTH RINSE: 15.0000 mL | OROMUCOSAL | Status: DC | PRN

## 2024-05-18 MED ORDER — POTASSIUM CHLORIDE CRYS ER 20 MEQ PO TBCR
40.0000 meq | EXTENDED_RELEASE_TABLET | Freq: Once | ORAL | Status: AC
  Administered 2024-05-18: 40 meq via ORAL
  Filled 2024-05-18: qty 2

## 2024-05-18 MED ORDER — OXYCODONE HCL 5 MG PO TABS
5.0000 mg | ORAL_TABLET | ORAL | Status: DC | PRN
  Administered 2024-05-18 (×3): 10 mg via ORAL
  Administered 2024-05-19: 5 mg via ORAL
  Administered 2024-05-19: 10 mg via ORAL
  Filled 2024-05-18: qty 2
  Filled 2024-05-18: qty 1
  Filled 2024-05-18 (×3): qty 2

## 2024-05-18 MED ORDER — LACTATED RINGERS IV SOLN: INTRAVENOUS | Status: DC

## 2024-05-18 MED ORDER — SIMETHICONE 80 MG PO CHEW
160.0000 mg | CHEWABLE_TABLET | Freq: Once | ORAL | Status: AC
  Administered 2024-05-18: 160 mg via ORAL
  Filled 2024-05-18: qty 2

## 2024-05-18 NOTE — Plan of Care (Signed)

## 2024-05-18 NOTE — Progress Notes (Signed)
 PROGRESS NOTE    Judy Robles  FMW:[id number] DOB: [birth year] DOA: 05/17/2024 PCP: [place], No    Brief Narrative:   Judy Robles is a 48 y.o. female with past medical history significant for PTSD, anxiety/depression, bipolar disorder, polysubstance abuse (cocaine, amphetamines, benzodiazepines, THC), reported malingering behavior for secondary gain who presented to [facility] ED on 05/17/2024 with nausea, vomiting, diarrhea, and abdominal pain.  Onset day prior to admission.  Reports has been trying to avoid alcohol and illicit substances, last consumed alcohol 4 days prior.  Shortly thereafter started developing symptoms.  Vomitus reported as nonbloody/nonbilious.  Denies fever/chills, no recent travel, no sick contacts.  Denies melena, hematochezia or hematemesis.  In the ED, temperature 97.6 F, HR 67, RR 18, BP 139/80, SpO2 100% on room air.  WBC 14.0, hemoglobin 14.4, platelet 224.  Sodium 134, potassium 3.1, chloride 98, CO2 17, glucose 74, BUN 28, creatinine 2.43.  AST 116, ALT 82, total bilirubin 0.7.  Lipase 29.  Lactic acid 1.1.  hCG negative.  COVID/influenza/RSV PCR negative.  Urinalysis unrevealing.  CT abdomen/pelvis without contrast with no acute findings in the abdomen/pelvis.  Patient received 2 L LR, Zofran , fentanyl  and IV potassium in the ED.  TRH consulted for admission.  Assessment & Plan:   Acute renal failure: Improving Patient presenting with an elevated creatinine of 2.43.  Etiology likely secondary to prerenal azotemia in setting of dehydration from vomiting and diarrhea.  Given IV fluid resuscitation now tolerating oral intake. -- Cr 2.43>1.73 -- LR at 100 mL/h -- Advance to regular diet today -- Avoid nephrotoxins, renal cell medications -- Repeat BMP in a.m.  Abdominal pain with nausea, vomiting, diarrhea Patient presenting with abrupt onset of nausea, vomiting, diarrhea with subsequent abdominal pain following abrupt cessation from alcohol use.  Has longstanding history  of polysubstance abuse with cocaine, THC, benzodiazepines and amphetamines.  Patient is afebrile, slightly elevated WBC count of 14.0 (suspected reactive), with normal lactic acid.  Urinalysis unrevealing.  CT abdomen/pelvis with no acute findings.  Suspect etiology likely secondary to viral gastroenteritis versus abrupt cessation from illicit substances. -- GI PCR and C. difficile PCR: Pending -- Continue enteric precaution for now -- Tolerating clear liquid diet, advancing to regular diet today -- Supportive care, antiemetics  Elevated transaminases: Improving Patient with elevated LFTs, suspect likely secondary to heavy alcohol use and polysubstance use outpatient.  CT abdomen/pelvis with no acute hepatobiliary findings.  Bilirubin and lipase were within normal limits.  LFTs downtrending. --CMP daily  Alcohol use disorder Polysubstance abuse Patient continues to endorse alcohol use, although no use within the last 4 days reported.  EtOH level undetectable on admission.  Previous UDS reviewed in EMR and notable for positivity with cocaine, THC, amphetamines and benzodiazepines.  Counseled on need for complete cessation/abstinence. -- CIWA protocol with symptom triggered Ativan  -- Multivitamin, thiamine , folic acid  -- TOC for substance abuse resources  PTSD Anxiety/depression Bipolar disorder --Continue outpatient follow-up with behavioral health  Homelessness Patient currently states she lives with a friend. -- TOC consult   DVT prophylaxis: heparin  injection 5,000 Units Start: 05/18/24 0600    Code Status: Full Code Family Communication: No family present at bedside this morning  Disposition Plan:  Level of care: Telemetry Status is: Inpatient Remains inpatient appropriate because: IV fluids, advancing diet; if tolerates anticipate discharge home tomorrow    Consultants:  None  Procedures:  None  Antimicrobials:  None   Subjective: Patient seen examined at  bedside, lying in bed.  No  specific complaints this morning.  Tolerated peanut butter crackers overnight per RN.  Patient denies any current nausea.  No further diarrhea reported.  Discussed will advance diet.  Creatinine improved.  Discussed anticipated discharge home tomorrow if continued to improve.  No other questions or concerns at this time.  Denies headache, no dizziness, no chest pain, no palpitations, no shortness of breath, no current abdominal pain, no fever/chills/night sweats, no current nausea/vomiting, no further diarrhea reported, no focal weakness, no fatigue, no paresthesia.  No acute events overnight per nursing.  Objective: Vitals:   05/17/24 2325 05/18/24 0030 05/18/24 0416 05/18/24 0739  BP:  (!) 138/116 134/67 (!) 157/60  Pulse:  61 (!) 54 67  Resp:  16 16   Temp: 98.8 F (37.1 C) 99.3 F (37.4 C) 98.6 F (37 C) 98.7 F (37.1 C)  TempSrc: Oral Oral Oral Oral  SpO2:  98% 97% 97%  Weight:      Height:        Intake/Output Summary (Last 24 hours) at 05/18/2024 1039 Last data filed at 05/18/2024 0607 Gross per 24 hour  Intake 1128.01 ml  Output --  Net 1128.01 ml   Filed Weights   05/17/24 1819  Weight: 108.9 kg    Examination:  Physical Exam: GEN: NAD, alert and oriented x 3, chronically ill/disheveled in appearance, appears older than stated age HEENT: NCAT, PERRL, EOMI, sclera clear, MMM PULM: CTAB w/o wheezes/crackles, normal respiratory effort, on room air CV: RRR w/o M/G/R GI: abd soft, NTND, + BS MSK: no peripheral edema, moves all extremities independently NEURO: No focal neurological deficit PSYCH: normal mood/affect    Data Reviewed: I have personally reviewed following labs and imaging studies  CBC: Recent Labs  Lab 05/17/24 1836 05/18/24 0359  WBC 14.0* 10.9*  HGB 14.4 12.5  HCT 43.8 37.6  MCV 96.1 98.2  PLT 224 157   Basic Metabolic Panel: Recent Labs  Lab 05/17/24 1836 05/18/24 0359  NA 134* 136  K 3.1* 3.5  CL 98 106   CO2 17* 18*  GLUCOSE 74 107*  BUN 28* 20  CREATININE 2.43* 1.73*  CALCIUM  9.7 9.1  MG  --  2.3   GFR: Estimated Creatinine Clearance: 44.3 mL/min (A) (by C-G formula based on SCr of 1.73 mg/dL (H)). Liver Function Tests: Recent Labs  Lab 05/17/24 1836 05/18/24 0359  AST 116* 84*  ALT 82* 62*  ALKPHOS 71 47  BILITOT 0.7 0.4  PROT 8.3* 6.7  ALBUMIN 4.6 3.8   Recent Labs  Lab 05/17/24 1836  LIPASE 29   No results for input(s): AMMONIA in the last 168 hours. Coagulation Profile: No results for input(s): INR, PROTIME in the last 168 hours. Cardiac Enzymes: No results for input(s): CKTOTAL, CKMB, CKMBINDEX, TROPONINI in the last 168 hours. BNP (last 3 results) No results for input(s): PROBNP in the last 8760 hours. HbA1C: No results for input(s): HGBA1C in the last 72 hours. CBG: No results for input(s): GLUCAP in the last 168 hours. Lipid Profile: No results for input(s): CHOL, HDL, LDLCALC, TRIG, CHOLHDL, LDLDIRECT in the last 72 hours. Thyroid  Function Tests: No results for input(s): TSH, T4TOTAL, FREET4, T3FREE, THYROIDAB in the last 72 hours. Anemia Panel: No results for input(s): VITAMINB12, FOLATE, FERRITIN, TIBC, IRON , RETICCTPCT in the last 72 hours. Sepsis Labs: Recent Labs  Lab 05/17/24 2224  LATICACIDVEN 1.1    Recent Results (from the past 240 hours)  Resp panel by RT-PCR (RSV, Flu A&B, Covid) Anterior Nasal Swab  Status: None   Collection Time: 05/17/24  8:20 PM   Specimen: Anterior Nasal Swab  Result Value Ref Range Status   SARS Coronavirus 2 by RT PCR NEGATIVE NEGATIVE Final   Influenza A by PCR NEGATIVE NEGATIVE Final   Influenza B by PCR NEGATIVE NEGATIVE Final   Resp Syncytial Virus by PCR NEGATIVE NEGATIVE Final    Comment: Performed at [facility], [address] [place], [place] [address]         Radiology Studies: CT ABDOMEN PELVIS WO CONTRAST Result  Date: 05/17/2024 EXAM: CT ABDOMEN AND PELVIS WITHOUT CONTRAST 05/17/2024 08:40:37 PM TECHNIQUE: CT of the abdomen and pelvis was performed without the administration of intravenous contrast. Multiplanar reformatted images are provided for review. Automated exposure control, iterative reconstruction, and/or weight-based adjustment of the mA/kV was utilized to reduce the radiation dose to as low as reasonably achievable. COMPARISON: CT abdomen and pelvis 11/19/2017. CLINICAL HISTORY: Abdominal pain, acute, nonlocalized. FINDINGS: LOWER CHEST: No acute abnormality. LIVER: No acute abnormality. GALLBLADDER AND BILE DUCTS: Gallbladder is unremarkable. No biliary ductal dilatation. SPLEEN: No acute abnormality. PANCREAS: No acute abnormality. ADRENAL GLANDS: No acute abnormality. KIDNEYS, URETERS AND BLADDER: No stones in the kidneys or ureters. No hydronephrosis. No perinephric or periureteral stranding. Urinary bladder is unremarkable. GI AND BOWEL: Stomach demonstrates no acute abnormality. There is no bowel obstruction. Normal appendix. PERITONEUM AND RETROPERITONEUM: No ascites. No free air. VASCULATURE: Aorta is normal in caliber. Aortic atherosclerotic calcification. LYMPH NODES: No lymphadenopathy. REPRODUCTIVE ORGANS: No acute abnormality. BONES AND SOFT TISSUES: No acute osseous abnormality. No focal soft tissue abnormality. IMPRESSION: 1. No acute findings in the abdomen or pelvis. Electronically signed by: Norman Gatlin MD 05/17/2024 09:00 PM EDT RP Workstation: [id number]        Scheduled Meds:  folic acid   1 mg Oral Daily   heparin   5,000 Units Subcutaneous Q8H   LORazepam   0-4 mg Oral Q6H   Followed by   NOREEN ON 05/19/2024] LORazepam   0-4 mg Oral Q12H   multivitamin with minerals  1 tablet Oral Daily   sodium chloride  flush  3 mL Intravenous Q12H   thiamine   100 mg Oral Daily   Or   thiamine   100 mg Intravenous Daily   Continuous Infusions:  lactated ringers  100 mL/hr at 05/18/24 0657      LOS: 1 day    Time spent: 48 minutes spent on 05/18/2024 caring for this patient face-to-face including chart review, ordering labs/tests, documenting, discussion with nursing staff, consultants, updating family and interview/physical exam    Camellia PARAS [place], DO [facility] Available via Epic secure chat 7am-7pm After these hours, please refer to coverage provider listed on [email or web address] 05/18/2024, 10:39 AM

## 2024-05-19 DIAGNOSIS — N179 Acute kidney failure, unspecified: Secondary | ICD-10-CM | POA: Diagnosis not present

## 2024-05-19 LAB — CBC
HCT: 37.9 % (ref 36.0–46.0)
Hemoglobin: 11.8 g/dL — ABNORMAL LOW (ref 12.0–15.0)
MCH: 31.1 pg (ref 26.0–34.0)
MCHC: 31.1 g/dL (ref 30.0–36.0)
MCV: 100 fL (ref 80.0–100.0)
Platelets: 157 K/uL (ref 150–400)
RBC: 3.79 MIL/uL — ABNORMAL LOW (ref 3.87–5.11)
RDW: 13 % (ref 11.5–15.5)
WBC: 7.1 K/uL (ref 4.0–10.5)
nRBC: 0 % (ref 0.0–0.2)

## 2024-05-19 LAB — URINE DRUG SCREEN
Amphetamines: NEGATIVE
Barbiturates: NEGATIVE
Benzodiazepines: NEGATIVE
Cocaine: POSITIVE — AB
Fentanyl: POSITIVE — AB
Methadone Scn, Ur: NEGATIVE
Opiates: NEGATIVE
Tetrahydrocannabinol: NEGATIVE

## 2024-05-19 LAB — COMPREHENSIVE METABOLIC PANEL WITH GFR
ALT: 52 U/L — ABNORMAL HIGH (ref 0–44)
AST: 58 U/L — ABNORMAL HIGH (ref 15–41)
Albumin: 3.6 g/dL (ref 3.5–5.0)
Alkaline Phosphatase: 47 U/L (ref 38–126)
Anion gap: 10 (ref 5–15)
BUN: 18 mg/dL (ref 6–20)
CO2: 19 mmol/L — ABNORMAL LOW (ref 22–32)
Calcium: 9.7 mg/dL (ref 8.9–10.3)
Chloride: 105 mmol/L (ref 98–111)
Creatinine, Ser: 1.25 mg/dL — ABNORMAL HIGH (ref 0.44–1.00)
GFR, Estimated: 52 mL/min — ABNORMAL LOW (ref >60)
Glucose, Bld: 94 mg/dL (ref 70–99)
Potassium: 4.2 mmol/L (ref 3.5–5.1)
Sodium: 134 mmol/L — ABNORMAL LOW (ref 135–145)
Total Bilirubin: 0.2 mg/dL (ref 0.0–1.2)
Total Protein: 6.4 g/dL — ABNORMAL LOW (ref 6.5–8.1)

## 2024-05-19 MED ORDER — OXYCODONE HCL 5 MG PO TABS: 5.0000 mg | ORAL_TABLET | Freq: Four times a day (QID) | ORAL | 0 refills | Status: AC | PRN

## 2024-05-19 NOTE — Discharge Summary (Signed)
 Physician Discharge Summary  Judy Robles FMW:[id number] DOB: 02-01-1976 DOA: 05/17/2024  PCP: Pcp, No  Admit date: 05/17/2024 Discharge date: 05/19/2024  Admitted From: Home Disposition: Home  Recommendations for Outpatient Follow-up:  Follow up with PCP in 1-2 weeks Continue to encourage abstinence from alcohol and illicit substances  Home Health: No Equipment/Devices: None  Discharge Condition: Stable CODE STATUS: Full code Diet recommendation: Regular diet  History of present illness:  Judy Robles is a 48 y.o. female with past medical history significant for PTSD, anxiety/depression, bipolar disorder, polysubstance abuse (cocaine, amphetamines, benzodiazepines, THC), reported malingering behavior for secondary gain who presented to [facility] ED on 05/17/2024 with nausea, vomiting, diarrhea, and abdominal pain.  Onset day prior to admission.  Reports has been trying to avoid alcohol and illicit substances, last consumed alcohol 4 days prior.  Shortly thereafter started developing symptoms.  Vomitus reported as nonbloody/nonbilious.  Denies fever/chills, no recent travel, no sick contacts.  Denies melena, hematochezia or hematemesis.   In the ED, temperature 97.6 F, HR 67, RR 18, BP 139/80, SpO2 100% on room air.  WBC 14.0, hemoglobin 14.4, platelet 224.  Sodium 134, potassium 3.1, chloride 98, CO2 17, glucose 74, BUN 28, creatinine 2.43.  AST 116, ALT 82, total bilirubin 0.7.  Lipase 29.  Lactic acid 1.1.  hCG negative.  COVID/influenza/RSV PCR negative.  Urinalysis unrevealing.  CT abdomen/pelvis without contrast with no acute findings in the abdomen/pelvis.  Patient received 2 L LR, Zofran , fentanyl  and IV potassium in the ED.  TRH consulted for admission.  Hospital course:  Acute renal failure Patient presenting with an elevated creatinine of 2.43.  Etiology likely secondary to prerenal azotemia in setting of dehydration from vomiting and diarrhea.  Given IV fluid resuscitation now  tolerating oral intake.  Creatinine improved to 1.25 at time of discharge.  Tolerating advance diet without further nausea or vomiting.  No further diarrhea.  Continue to encourage increased oral intake avoidance of illicit substances as well as alcohol.  Outpatient follow-up PCP.   Abdominal pain with nausea, vomiting, diarrhea Patient presenting with abrupt onset of nausea, vomiting, diarrhea with subsequent abdominal pain following abrupt cessation from alcohol use.  Has longstanding history of polysubstance abuse with cocaine, THC, benzodiazepines and amphetamines.  Patient is afebrile, slightly elevated WBC count of 14.0 (suspected reactive), with normal lactic acid.  Urinalysis unrevealing.  CT abdomen/pelvis with no acute findings.  Suspect etiology likely secondary to viral gastroenteritis versus abrupt cessation from illicit substances.  No further diarrhea while inpatient.  Started on clear liquid diet and advanced with toleration.  WBC count improved to 7.1 at time of discharge.   Elevated transaminases: Improving Patient with elevated LFTs, suspect likely secondary to heavy alcohol use and polysubstance use outpatient.  CT abdomen/pelvis with no acute hepatobiliary findings.  Bilirubin and lipase were within normal limits.  LFTs downtrending.   Alcohol use disorder Polysubstance abuse Patient continues to endorse alcohol use, although no use within the last 4 days reported.  EtOH level undetectable on admission.  Previous UDS reviewed in EMR and notable for positivity with cocaine, THC, amphetamines and benzodiazepines.  UDS on admission positive for cocaine.  Counseled on need for complete cessation/abstinence.  Patient was monitored on CIWA protocol with symptom triggered Ativan .  No significant elevated CIWA scores noted while inpatient.  TOC consulted for subs abuse resources.  Continue to encourage complete cessation/abstinence.   PTSD Anxiety/depression Bipolar disorder Continue  outpatient follow-up with behavioral health   Homelessness Patient currently  states she lives with a friend.  TOC consulted  Discharge Diagnoses:  Principal Problem:   AKI (acute kidney injury) Active Problems:   Hypokalemia   Diarrhea   Nausea and vomiting   MDD (major depressive disorder)   PTSD (post-traumatic stress disorder)   Bipolar II disorder (HCC)   Alcohol use disorder   Elevated transaminase level    Discharge Instructions  Discharge Instructions     Call MD for:  difficulty breathing, headache or visual disturbances   Complete by: As directed    Call MD for:  extreme fatigue   Complete by: As directed    Call MD for:  persistant dizziness or light-headedness   Complete by: As directed    Call MD for:  persistant nausea and vomiting   Complete by: As directed    Call MD for:  severe uncontrolled pain   Complete by: As directed    Call MD for:  temperature >100.4   Complete by: As directed    Diet - low sodium heart healthy   Complete by: As directed    Increase activity slowly   Complete by: As directed       Allergies as of 05/19/2024       Reactions   Gramineae Pollens Itching   Lisinopril  Other (See Comments)   Shuts down kidneys   Mirtazapine  Other (See Comments)   Tremors        Medication List     TAKE these medications    gabapentin  400 MG capsule Commonly known as: NEURONTIN  Take 1 capsule (400 mg total) by mouth 3 (three) times daily.   oxyCODONE  5 MG immediate release tablet Commonly known as: Oxy IR/ROXICODONE  Take 1 tablet (5 mg total) by mouth every 6 (six) hours as needed for moderate pain (pain score 4-6) or severe pain (pain score 7-10).   senna 8.6 MG Tabs tablet Commonly known as: SENOKOT Take 2 tablets (17.2 mg total) by mouth 2 (two) times daily as needed for mild constipation or moderate constipation.   Vitamin D  (Ergocalciferol ) 1.25 MG (50000 UNIT) Caps capsule Commonly known as: DRISDOL  Take 1 capsule  (50,000 Units total) by mouth every 7 (seven) days.        Allergies  Allergen Reactions   Gramineae Pollens Itching   Lisinopril  Other (See Comments)    Shuts down kidneys   Mirtazapine  Other (See Comments)    Tremors    Consultations: None   Procedures/Studies: CT ABDOMEN PELVIS WO CONTRAST Result Date: 05/17/2024 EXAM: CT ABDOMEN AND PELVIS WITHOUT CONTRAST 05/17/2024 08:40:37 PM TECHNIQUE: CT of the abdomen and pelvis was performed without the administration of intravenous contrast. Multiplanar reformatted images are provided for review. Automated exposure control, iterative reconstruction, and/or weight-based adjustment of the mA/kV was utilized to reduce the radiation dose to as low as reasonably achievable. COMPARISON: CT abdomen and pelvis 11/19/2017. CLINICAL HISTORY: Abdominal pain, acute, nonlocalized. FINDINGS: LOWER CHEST: No acute abnormality. LIVER: No acute abnormality. GALLBLADDER AND BILE DUCTS: Gallbladder is unremarkable. No biliary ductal dilatation. SPLEEN: No acute abnormality. PANCREAS: No acute abnormality. ADRENAL GLANDS: No acute abnormality. KIDNEYS, URETERS AND BLADDER: No stones in the kidneys or ureters. No hydronephrosis. No perinephric or periureteral stranding. Urinary bladder is unremarkable. GI AND BOWEL: Stomach demonstrates no acute abnormality. There is no bowel obstruction. Normal appendix. PERITONEUM AND RETROPERITONEUM: No ascites. No free air. VASCULATURE: Aorta is normal in caliber. Aortic atherosclerotic calcification. LYMPH NODES: No lymphadenopathy. REPRODUCTIVE ORGANS: No acute abnormality. BONES AND SOFT TISSUES: No  acute osseous abnormality. No focal soft tissue abnormality. IMPRESSION: 1. No acute findings in the abdomen or pelvis. Electronically signed by: Norman Gatlin MD 05/17/2024 09:00 PM EDT RP Workstation: [id number]     Subjective: Patient seen examined bedside, lying comfortably in bed.  Eating breakfast and watching TV.  No  further episodes of vomiting, or diarrhea.  Tolerating diet.  Creatinine markedly improved.  Electrolyte disturbances corrected.  No other complaints or concerns at this time.  Denies headache, no dizziness, no chest pain, no palpitations, no shortness of breath, no abdominal pain, no fever/chills/night sweats, no nausea/vomiting/diarrhea, no focal weakness, no fatigue, no paresthesias.  No acute events overnight per nursing staff.  Discharge Exam: Vitals:   05/19/24 0400 05/19/24 0527  BP:  (!) 147/64  Pulse: 60 62  Resp:  20  Temp:  98.9 F (37.2 C)  SpO2:  98%   Vitals:   05/18/24 1204 05/18/24 2004 05/19/24 0400 05/19/24 0527  BP: (!) 146/70 (!) 150/50  (!) 147/64  Pulse: (!) 56 (!) 57 60 62  Resp:  20  20  Temp: 98.7 F (37.1 C) 99.4 F (37.4 C)  98.9 F (37.2 C)  TempSrc: Oral Oral  Oral  SpO2: 96% 96%  98%  Weight:      Height:        Physical Exam: GEN: NAD, alert and oriented x 3, chronically ill/disheveled in appearance, appears older than stated age HEENT: NCAT, PERRL, EOMI, sclera clear, MMM PULM: CTAB w/o wheezes/crackles, normal respiratory effort, on room air CV: RRR w/o M/G/R GI: abd soft, NTND, + BS MSK: no peripheral edema, moves all extremities independently NEURO: No focal neurological deficit PSYCH: normal mood/affect    The results of significant diagnostics from this hospitalization (including imaging, microbiology, ancillary and laboratory) are listed below for reference.     Microbiology: Recent Results (from the past 240 hours)  Resp panel by RT-PCR (RSV, Flu A&B, Covid) Anterior Nasal Swab     Status: None   Collection Time: 05/17/24  8:20 PM   Specimen: Anterior Nasal Swab  Result Value Ref Range Status   SARS Coronavirus 2 by RT PCR NEGATIVE NEGATIVE Final   Influenza A by PCR NEGATIVE NEGATIVE Final   Influenza B by PCR NEGATIVE NEGATIVE Final   Resp Syncytial Virus by PCR NEGATIVE NEGATIVE Final    Comment: Performed at [facility], [address], [place], [place] [address]     Labs: BNP (last 3 results) No results for input(s): BNP in the last 8760 hours. Basic Metabolic Panel: Recent Labs  Lab 05/17/24 1836 05/18/24 0359 05/19/24 0407  NA 134* 136 134*  K 3.1* 3.5 4.2  CL 98 106 105  CO2 17* 18* 19*  GLUCOSE 74 107* 94  BUN 28* 20 18  CREATININE 2.43* 1.73* 1.25*  CALCIUM  9.7 9.1 9.7  MG  --  2.3  --    Liver Function Tests: Recent Labs  Lab 05/17/24 1836 05/18/24 0359 05/19/24 0407  AST 116* 84* 58*  ALT 82* 62* 52*  ALKPHOS 71 47 47  BILITOT 0.7 0.4 0.2  PROT 8.3* 6.7 6.4*  ALBUMIN 4.6 3.8 3.6   Recent Labs  Lab 05/17/24 1836  LIPASE 29   No results for input(s): AMMONIA in the last 168 hours. CBC: Recent Labs  Lab 05/17/24 1836 05/18/24 0359 05/19/24 0407  WBC 14.0* 10.9* 7.1  HGB 14.4 12.5 11.8*  HCT 43.8 37.6 37.9  MCV 96.1 98.2 100.0  PLT  224 157 157   Cardiac Enzymes: No results for input(s): CKTOTAL, CKMB, CKMBINDEX, TROPONINI in the last 168 hours. BNP: Invalid input(s): POCBNP CBG: No results for input(s): GLUCAP in the last 168 hours. D-Dimer No results for input(s): DDIMER in the last 72 hours. Hgb A1c No results for input(s): HGBA1C in the last 72 hours. Lipid Profile No results for input(s): CHOL, HDL, LDLCALC, TRIG, CHOLHDL, LDLDIRECT in the last 72 hours. Thyroid  function studies No results for input(s): TSH, T4TOTAL, T3FREE, THYROIDAB in the last 72 hours.  Invalid input(s): FREET3 Anemia work up No results for input(s): VITAMINB12, FOLATE, FERRITIN, TIBC, IRON , RETICCTPCT in the last 72 hours. Urinalysis    Component Value Date/Time   COLORURINE YELLOW 05/17/2024 2012   APPEARANCEUR CLOUDY (A) 05/17/2024 2012   LABSPEC 1.017 05/17/2024 2012   PHURINE 5.0 05/17/2024 2012   GLUCOSEU NEGATIVE 05/17/2024 2012   HGBUR SMALL (A) 05/17/2024 2012   BILIRUBINUR NEGATIVE 05/17/2024  2012   KETONESUR NEGATIVE 05/17/2024 2012   PROTEINUR 100 (A) 05/17/2024 2012   UROBILINOGEN 0.2 07/03/2015 0618   NITRITE NEGATIVE 05/17/2024 2012   LEUKOCYTESUR NEGATIVE 05/17/2024 2012   Sepsis Labs Recent Labs  Lab 05/17/24 1836 05/18/24 0359 05/19/24 0407  WBC 14.0* 10.9* 7.1   Microbiology Recent Results (from the past 240 hours)  Resp panel by RT-PCR (RSV, Flu A&B, Covid) Anterior Nasal Swab     Status: None   Collection Time: 05/17/24  8:20 PM   Specimen: Anterior Nasal Swab  Result Value Ref Range Status   SARS Coronavirus 2 by RT PCR NEGATIVE NEGATIVE Final   Influenza A by PCR NEGATIVE NEGATIVE Final   Influenza B by PCR NEGATIVE NEGATIVE Final   Resp Syncytial Virus by PCR NEGATIVE NEGATIVE Final    Comment: Performed at [facility], [address] [place], [place] [address]     Time coordinating discharge: Over 30 minutes  SIGNED:   Camellia PARAS [place], DO  [facility] 05/19/2024, 9:37 AM

## 2024-05-19 NOTE — Progress Notes (Signed)
   05/19/24 1018  TOC Brief Assessment  Insurance and Status Reviewed  Patient has primary care physician Yes  Home environment has been reviewed Staying with a friend  Prior level of function: Independent with ADL's  Prior/Current Home Services No current home services  Social Drivers of Health Review SDOH reviewed needs interventions;SDOH reviewed interventions complete  Readmission risk has been reviewed Yes  Transition of care needs no transition of care needs at this time   Pt screened. IP CM consulted for homelessness. Resources added to AVS for homeless shelters. Outpatient and Inpatient substance use centers added to AVS. Food resources added to AVS. Pt states she has transportation needs, bus passes placed on shadow chart, ready at time of DC. There are no further IP CM needs at this time.

## 2024-05-25 ENCOUNTER — Emergency Department (HOSPITAL_COMMUNITY): Payer: MEDICAID

## 2024-05-25 ENCOUNTER — Other Ambulatory Visit: Payer: Self-pay

## 2024-05-25 ENCOUNTER — Inpatient Hospital Stay (HOSPITAL_COMMUNITY)
Admission: EM | Admit: 2024-05-25 | Discharge: 2024-05-31 | DRG: 690 | Disposition: A | Discharge status: Home / Self Care | Payer: MEDICAID | Attending: Family Medicine | Admitting: Family Medicine

## 2024-05-25 ENCOUNTER — Encounter (HOSPITAL_COMMUNITY): Payer: Self-pay | Admitting: Internal Medicine

## 2024-05-25 DIAGNOSIS — R918 Other nonspecific abnormal finding of lung field: Secondary | ICD-10-CM | POA: Diagnosis present

## 2024-05-25 DIAGNOSIS — Z1152 Encounter for screening for COVID-19: Secondary | ICD-10-CM

## 2024-05-25 DIAGNOSIS — N10 Acute tubulo-interstitial nephritis: Secondary | ICD-10-CM | POA: Diagnosis present

## 2024-05-25 DIAGNOSIS — E7800 Pure hypercholesterolemia, unspecified: Secondary | ICD-10-CM | POA: Diagnosis present

## 2024-05-25 DIAGNOSIS — Z6841 Body Mass Index (BMI) 40.0 and over, adult: Secondary | ICD-10-CM

## 2024-05-25 DIAGNOSIS — E66813 Obesity, class 3: Secondary | ICD-10-CM | POA: Diagnosis present

## 2024-05-25 DIAGNOSIS — Z5900 Homelessness unspecified: Secondary | ICD-10-CM | POA: Diagnosis not present

## 2024-05-25 DIAGNOSIS — Z888 Allergy status to other drugs, medicaments and biological substances status: Secondary | ICD-10-CM

## 2024-05-25 DIAGNOSIS — B964 Proteus (mirabilis) (morganii) as the cause of diseases classified elsewhere: Secondary | ICD-10-CM | POA: Diagnosis present

## 2024-05-25 DIAGNOSIS — J301 Allergic rhinitis due to pollen: Secondary | ICD-10-CM | POA: Diagnosis present

## 2024-05-25 DIAGNOSIS — E860 Dehydration: Secondary | ICD-10-CM | POA: Diagnosis present

## 2024-05-25 DIAGNOSIS — I10 Essential (primary) hypertension: Secondary | ICD-10-CM | POA: Diagnosis present

## 2024-05-25 DIAGNOSIS — K219 Gastro-esophageal reflux disease without esophagitis: Secondary | ICD-10-CM | POA: Diagnosis present

## 2024-05-25 DIAGNOSIS — F1090 Alcohol use, unspecified, uncomplicated: Secondary | ICD-10-CM | POA: Diagnosis present

## 2024-05-25 DIAGNOSIS — Z79899 Other long term (current) drug therapy: Secondary | ICD-10-CM

## 2024-05-25 DIAGNOSIS — F1110 Opioid abuse, uncomplicated: Secondary | ICD-10-CM | POA: Diagnosis present

## 2024-05-25 DIAGNOSIS — N12 Tubulo-interstitial nephritis, not specified as acute or chronic: Principal | ICD-10-CM

## 2024-05-25 DIAGNOSIS — F1910 Other psychoactive substance abuse, uncomplicated: Secondary | ICD-10-CM | POA: Diagnosis present

## 2024-05-25 DIAGNOSIS — K7030 Alcoholic cirrhosis of liver without ascites: Secondary | ICD-10-CM | POA: Diagnosis present

## 2024-05-25 DIAGNOSIS — F1010 Alcohol abuse, uncomplicated: Secondary | ICD-10-CM | POA: Diagnosis present

## 2024-05-25 DIAGNOSIS — Z8614 Personal history of Methicillin resistant Staphylococcus aureus infection: Secondary | ICD-10-CM | POA: Diagnosis not present

## 2024-05-25 DIAGNOSIS — F1410 Cocaine abuse, uncomplicated: Secondary | ICD-10-CM | POA: Diagnosis present

## 2024-05-25 DIAGNOSIS — Z8249 Family history of ischemic heart disease and other diseases of the circulatory system: Secondary | ICD-10-CM

## 2024-05-25 DIAGNOSIS — R9431 Abnormal electrocardiogram [ECG] [EKG]: Secondary | ICD-10-CM | POA: Diagnosis not present

## 2024-05-25 DIAGNOSIS — R197 Diarrhea, unspecified: Secondary | ICD-10-CM | POA: Diagnosis present

## 2024-05-25 DIAGNOSIS — B1920 Unspecified viral hepatitis C without hepatic coma: Secondary | ICD-10-CM | POA: Insufficient documentation

## 2024-05-25 DIAGNOSIS — F17210 Nicotine dependence, cigarettes, uncomplicated: Secondary | ICD-10-CM | POA: Diagnosis present

## 2024-05-25 DIAGNOSIS — F1310 Sedative, hypnotic or anxiolytic abuse, uncomplicated: Secondary | ICD-10-CM | POA: Diagnosis present

## 2024-05-25 DIAGNOSIS — R001 Bradycardia, unspecified: Secondary | ICD-10-CM | POA: Diagnosis not present

## 2024-05-25 LAB — URINALYSIS, W/ REFLEX TO CULTURE (INFECTION SUSPECTED)
Bacteria, UA: NONE SEEN
Bilirubin Urine: NEGATIVE
Glucose, UA: NEGATIVE mg/dL
Ketones, ur: NEGATIVE mg/dL
Nitrite: NEGATIVE
Protein, ur: >=300 mg/dL — AB
RBC / HPF: >50 RBC/hpf (ref 0–5)
Specific Gravity, Urine: 1.016 (ref 1.005–1.030)
WBC, UA: >50 WBC/hpf (ref 0–5)
pH: 6 (ref 5.0–8.0)

## 2024-05-25 LAB — CBC WITH DIFFERENTIAL/PLATELET
Abs Immature Granulocytes: 0.06 K/uL (ref 0.00–0.07)
Basophils Absolute: 0.1 K/uL (ref 0.0–0.1)
Basophils Relative: 1 %
Eosinophils Absolute: 0.1 K/uL (ref 0.0–0.5)
Eosinophils Relative: 1 %
HCT: 41.7 % (ref 36.0–46.0)
Hemoglobin: 13.3 g/dL (ref 12.0–15.0)
Immature Granulocytes: 1 %
Lymphocytes Relative: 20 %
Lymphs Abs: 2.1 K/uL (ref 0.7–4.0)
MCH: 31.6 pg (ref 26.0–34.0)
MCHC: 31.9 g/dL (ref 30.0–36.0)
MCV: 99 fL (ref 80.0–100.0)
Monocytes Absolute: 0.8 K/uL (ref 0.1–1.0)
Monocytes Relative: 8 %
Neutro Abs: 7.2 K/uL (ref 1.7–7.7)
Neutrophils Relative %: 69 %
Platelets: 200 K/uL (ref 150–400)
RBC: 4.21 MIL/uL (ref 3.87–5.11)
RDW: 12.6 % (ref 11.5–15.5)
WBC: 10.3 K/uL (ref 4.0–10.5)
nRBC: 0 % (ref 0.0–0.2)

## 2024-05-25 LAB — RESP PANEL BY RT-PCR (RSV, FLU A&B, COVID)  RVPGX2
Influenza A by PCR: NEGATIVE
Influenza B by PCR: NEGATIVE
Resp Syncytial Virus by PCR: NEGATIVE
SARS Coronavirus 2 by RT PCR: NEGATIVE

## 2024-05-25 LAB — COMPREHENSIVE METABOLIC PANEL WITH GFR
ALT: 39 U/L (ref 0–44)
AST: 36 U/L (ref 15–41)
Albumin: 4.3 g/dL (ref 3.5–5.0)
Alkaline Phosphatase: 60 U/L (ref 38–126)
Anion gap: 12 (ref 5–15)
BUN: 11 mg/dL (ref 6–20)
CO2: 24 mmol/L (ref 22–32)
Calcium: 9.6 mg/dL (ref 8.9–10.3)
Chloride: 103 mmol/L (ref 98–111)
Creatinine, Ser: 0.98 mg/dL (ref 0.44–1.00)
GFR, Estimated: >60 mL/min (ref >60)
Glucose, Bld: 89 mg/dL (ref 70–99)
Potassium: 3.7 mmol/L (ref 3.5–5.1)
Sodium: 139 mmol/L (ref 135–145)
Total Bilirubin: 0.5 mg/dL (ref 0.0–1.2)
Total Protein: 7.7 g/dL (ref 6.5–8.1)

## 2024-05-25 LAB — I-STAT CG4 LACTIC ACID, ED: Lactic Acid, Venous: 0.9 mmol/L (ref 0.5–1.9)

## 2024-05-25 LAB — PROTIME-INR
INR: 1 (ref 0.8–1.2)
Prothrombin Time: 13.6 s (ref 11.4–15.2)

## 2024-05-25 LAB — HCG, SERUM, QUALITATIVE: Preg, Serum: NEGATIVE

## 2024-05-25 MED ORDER — CEFTRIAXONE SODIUM 1 G IJ SOLR
1.0000 g | Freq: Once | INTRAMUSCULAR | Status: AC
  Administered 2024-05-25: 1 g via INTRAVENOUS
  Filled 2024-05-25: qty 10

## 2024-05-25 MED ORDER — SODIUM CHLORIDE 0.9 % IV SOLN
2.0000 g | Freq: Once | INTRAVENOUS | Status: AC
  Administered 2024-05-25: 2 g via INTRAVENOUS
  Filled 2024-05-25: qty 20

## 2024-05-25 MED ORDER — SODIUM CHLORIDE 0.45 % IV SOLN: INTRAVENOUS | Status: DC

## 2024-05-25 MED ORDER — TRAMADOL HCL 50 MG PO TABS
50.0000 mg | ORAL_TABLET | Freq: Four times a day (QID) | ORAL | Status: DC | PRN
  Administered 2024-05-25 – 2024-05-26 (×2): 50 mg via ORAL
  Filled 2024-05-25 (×2): qty 1

## 2024-05-25 MED ORDER — KETOROLAC TROMETHAMINE 15 MG/ML IJ SOLN
15.0000 mg | Freq: Once | INTRAMUSCULAR | Status: AC
  Administered 2024-05-25: 15 mg via INTRAVENOUS
  Filled 2024-05-25 (×2): qty 1

## 2024-05-25 MED ORDER — ACETAMINOPHEN 650 MG RE SUPP: 650.0000 mg | Freq: Four times a day (QID) | RECTAL | Status: DC | PRN

## 2024-05-25 MED ORDER — SODIUM CHLORIDE 0.9 % IV BOLUS
1000.0000 mL | Freq: Once | INTRAVENOUS | Status: AC
Start: 2024-05-25 — End: 2024-05-26
  Administered 2024-05-25: 1000 mL via INTRAVENOUS

## 2024-05-25 MED ORDER — IOHEXOL 300 MG/ML  SOLN
100.0000 mL | Freq: Once | INTRAMUSCULAR | Status: AC | PRN
Start: 2024-05-25 — End: 2024-05-25
  Administered 2024-05-25: 100 mL via INTRAVENOUS

## 2024-05-25 MED ORDER — KETOROLAC TROMETHAMINE 15 MG/ML IJ SOLN
15.0000 mg | Freq: Once | INTRAMUSCULAR | Status: AC
Start: 2024-05-25 — End: 2024-05-25
  Administered 2024-05-25: 15 mg via INTRAVENOUS
  Filled 2024-05-25: qty 1

## 2024-05-25 MED ORDER — MORPHINE SULFATE (PF) 4 MG/ML IV SOLN
4.0000 mg | INTRAVENOUS | Status: DC | PRN
  Administered 2024-05-27 – 2024-05-28 (×2): 4 mg via INTRAVENOUS
  Filled 2024-05-25 (×2): qty 1

## 2024-05-25 MED ORDER — SODIUM CHLORIDE 0.9 % IV SOLN
2.0000 g | INTRAVENOUS | Status: DC
  Administered 2024-05-26: 2 g via INTRAVENOUS
  Filled 2024-05-25: qty 20

## 2024-05-25 MED ORDER — SODIUM CHLORIDE 0.9 % IV SOLN: 1.0000 g | Freq: Once | INTRAVENOUS | Status: DC

## 2024-05-25 MED ORDER — ACETAMINOPHEN 325 MG PO TABS
650.0000 mg | ORAL_TABLET | Freq: Four times a day (QID) | ORAL | Status: DC | PRN
  Administered 2024-05-27: 650 mg via ORAL
  Filled 2024-05-25: qty 2

## 2024-05-25 MED ORDER — MORPHINE SULFATE (PF) 2 MG/ML IV SOLN
2.0000 mg | Freq: Once | INTRAVENOUS | Status: AC
  Administered 2024-05-25: 2 mg via INTRAVENOUS
  Filled 2024-05-25: qty 1

## 2024-05-25 MED ORDER — OXYCODONE HCL 5 MG PO TABS: 5.0000 mg | ORAL_TABLET | Freq: Four times a day (QID) | ORAL | Status: DC | PRN

## 2024-05-25 MED ORDER — ENOXAPARIN SODIUM 40 MG/0.4ML IJ SOSY
40.0000 mg | PREFILLED_SYRINGE | INTRAMUSCULAR | Status: DC
  Administered 2024-05-25 – 2024-05-30 (×6): 40 mg via SUBCUTANEOUS
  Filled 2024-05-25 (×6): qty 0.4

## 2024-05-25 MED ORDER — ONDANSETRON HCL 4 MG PO TABS: 4.0000 mg | ORAL_TABLET | Freq: Four times a day (QID) | ORAL | Status: DC | PRN

## 2024-05-25 MED ORDER — ONDANSETRON HCL 4 MG/2ML IJ SOLN: 4.0000 mg | Freq: Four times a day (QID) | INTRAMUSCULAR | Status: DC | PRN

## 2024-05-25 MED ORDER — ONDANSETRON HCL 4 MG/2ML IJ SOLN
4.0000 mg | Freq: Once | INTRAMUSCULAR | Status: AC
  Administered 2024-05-25: 4 mg via INTRAVENOUS
  Filled 2024-05-25: qty 2

## 2024-05-25 MED ORDER — BUSPIRONE HCL 10 MG PO TABS
10.0000 mg | ORAL_TABLET | Freq: Three times a day (TID) | ORAL | Status: DC
  Administered 2024-05-25 – 2024-05-31 (×18): 10 mg via ORAL
  Filled 2024-05-25: qty 2
  Filled 2024-05-25: qty 1
  Filled 2024-05-25: qty 2
  Filled 2024-05-25 (×2): qty 1
  Filled 2024-05-25 (×4): qty 2
  Filled 2024-05-25 (×3): qty 1
  Filled 2024-05-25: qty 2
  Filled 2024-05-25: qty 1
  Filled 2024-05-25 (×4): qty 2

## 2024-05-25 NOTE — H&P (Signed)
 History and Physical    Patient: Judy Robles FMW:[id number] DOB: 03-24-1976 DOA: 05/25/2024 DOS: the patient was seen and examined on 05/25/2024 PCP: Pcp, No  Patient coming from: Home  Chief Complaint:  Chief Complaint  Patient presents with   Flank Pain   HPI: Judy Robles is a 48 y.o. female with medical history significant of osteoarthritis, bipolar 1 disorder, depression, PTSD, hyperlipidemia, hypertension, MRSA infection, history of symptomatic bradycardia, alcohol abuse, benzodiazepine abuse, drug overdose, cocaine abuse, opioid abuse, unspecified renal disease, sleep apnea not on CPAP who was recently discharged from the hospital continue to AKI who presented to the emergency department complaints of bilateral flank pain for several days and fever since earlier today.  No dysuria, frequency or hematuria.  She also had chills, nausea, emesis, diarrhea, myalgias and arthralgias but her GI symptoms were present before the fever and flank pain.  No night sweats, rhinorrhea, sore throat, wheezing or hemoptysis.  No chest pain, palpitations, diaphoresis, PND, orthopnea or pitting edema of the lower extremities.  No  constipation, melena or hematochezia.  No polyuria, polydipsia, polyphagia or blurred vision.   ED course: Initial vital signs were temperature 100.4 F, pulse 65, respiration 14, BP 159/125 mmHg and O2 sat.  The patient received ceftriaxone  2 g IVPB, ceftriaxone  1 g IVPB, ketorolac  15 mg IVP, morphine  2 mg IVP, ondansetron  4 mg IVP and 1000 mL of normal saline bolus.  Lab work: Urinalysis was turbid, with moderate hemoglobin and moderate leukocyte esterase and greater than 300 mg/dL protein.  Microscopic examination with no bacteria, but greater than 50 RBC, greater than 50 WBC, positive WBC clumps and mucus.  CBC, PT/INR, CMP and lactic acid were normal.  Negative coronavirus, influenza and RSV PCR test.  Serum pregnancy test was negative.  Imaging: Portable 1 view chest  radiograph with no active disease.  Aortic atherosclerosis.  CT abdomen/pelvis with contrast with suspicion for bilateral areas of pyelonephritis.  No repeat cyst.  No secondary changes seen in either kidney or ureter.  Normal appendix.  New cirrhosis.  Distal esophageal wall thickening with small hiatal hernia suspicious for esophagitis.  Multiple bilateral lower lobe pulmonary nodules.  Noncontrast chest CT follow-up in 12 months if high risk.  Aortic atherosclerosis.   Review of Systems: As mentioned in the history of present illness. All other systems reviewed and are negative. Past Medical History:  Diagnosis Date   Arthritis    Bipolar 1 disorder (HCC)    Depression    Hypercholesterolemia    Hypertension    MRSA (methicillin resistant Staphylococcus aureus)    Obesity    Renal disease    Sleep apnea    Past Surgical History:  Procedure Laterality Date   CESAREAN SECTION     FRACTURE SURGERY Right    ankle   I & D EXTREMITY Right 04/16/2014   Procedure: IRRIGATION AND DEBRIDEMENT EXTREMITY;  Surgeon: Prentice LELON Pagan, MD;  Location: [facility] OR;  Service: Orthopedics;  Laterality: Right;   Social History:  reports that she has been smoking cigarettes. She has never used smokeless tobacco. She reports current alcohol use. She reports current drug use. Drug: Cocaine.  Allergies  Allergen Reactions   Gramineae Pollens Itching   Lisinopril  Other (See Comments)    Shuts down kidneys   Mirtazapine  Other (See Comments)    Tremors    Family History  Problem Relation Age of Onset   Throat cancer Mother    Hypertension Father    Stroke Father  Prior to Admission medications   Medication Sig Start Date End Date Taking? Authorizing Provider  gabapentin  (NEURONTIN ) 400 MG capsule Take 1 capsule (400 mg total) by mouth 3 (three) times daily. 05/04/24   Amin, Ankit C, MD  oxyCODONE  (OXY IR/ROXICODONE ) 5 MG immediate release tablet Take 1 tablet (5 mg total) by mouth every 6 (six) hours as  needed for moderate pain (pain score 4-6) or severe pain (pain score 7-10). 05/19/24   [place], Eric J, DO  senna (SENOKOT) 8.6 MG TABS tablet Take 2 tablets (17.2 mg total) by mouth 2 (two) times daily as needed for mild constipation or moderate constipation. 05/04/24   Amin, Ankit C, MD  Vitamin D , Ergocalciferol , (DRISDOL ) 1.25 MG (50000 UNIT) CAPS capsule Take 1 capsule (50,000 Units total) by mouth every 7 (seven) days. 05/05/24   Teresa Wyline CROME, NP    Physical Exam: Vitals:   05/25/24 1043 05/25/24 1047 05/25/24 1048 05/25/24 1424  BP:  (!) 159/125    Pulse: 65     Resp:  14    Temp: (!) 100.4 F (38 C)   100.1 F (37.8 C)  Weight:   108.9 kg   Height:   5' 1 (1.549 m)    Physical Exam Vitals and nursing note reviewed.  Constitutional:      General: She is awake. She is not in acute distress.    Appearance: Normal appearance. She is ill-appearing.  HENT:     Head: Normocephalic.     Nose: No rhinorrhea.     Mouth/Throat:     Mouth: Mucous membranes are moist.  Eyes:     General: No scleral icterus.    Pupils: Pupils are equal, round, and reactive to light.  Neck:     Vascular: No JVD.  Cardiovascular:     Rate and Rhythm: Normal rate and regular rhythm.     Heart sounds: S1 normal and S2 normal.  Pulmonary:     Effort: Pulmonary effort is normal.     Breath sounds: Normal breath sounds. No wheezing, rhonchi or rales.  Abdominal:     General: Abdomen is protuberant. Bowel sounds are normal. There is no distension.     Palpations: Abdomen is soft.     Tenderness: There is abdominal tenderness in the right lower quadrant. There is right CVA tenderness and left CVA tenderness. There is no guarding or rebound. Negative signs include Murphy's sign.  Musculoskeletal:     Cervical back: Neck supple.     Right lower leg: No edema.     Left lower leg: No edema.  Skin:    General: Skin is warm and dry.  Neurological:     General: No focal deficit present.     Mental  Status: She is alert and oriented to person, place, and time.  Psychiatric:        Mood and Affect: Mood normal.        Behavior: Behavior normal. Behavior is cooperative.     Data Reviewed:  Results are pending, will review when available.  Assessment and Plan: Principal Problem:   Acute pyelonephritis Observation/MedSurg. Continue ceftriaxone  2 g IVPB. Follow-up urine culture and sensitivity. Analgesics as needed. Antiemetics as needed. Advised to cease or decrease cola sodas consumption. Follow CBC, CMP in AM.  Active Problems:   Essential hypertension Monitor blood pressure.   Antihypertensives as needed.    Diarrhea Continue IV fluids.    Alcohol use disorder According to the patient: -No recent alcohol consumption  since prior discharge.    Polysubstance abuse (HCC) Stated she used cocaine earlier this week. She denied any other recreational drugs. Advised to cease and seek counseling.    Advance Care Planning:   Code Status: Full Code   Consults:   Family Communication:   Severity of Illness: The appropriate patient status for this patient is INPATIENT. Inpatient status is judged to be reasonable and necessary in order to provide the required intensity of service to ensure the patient's safety. The patient's presenting symptoms, physical exam findings, and initial radiographic and laboratory data in the context of their chronic comorbidities is felt to place them at high risk for further clinical deterioration. Furthermore, it is not anticipated that the patient will be medically stable for discharge from the hospital within 2 midnights of admission.   * I certify that at the point of admission it is my clinical judgment that the patient will require inpatient hospital care spanning beyond 2 midnights from the point of admission due to high intensity of service, high risk for further deterioration and high frequency of surveillance required.*  Author: Alm Dorn Castor, MD 05/25/2024 3:08 PM  For on call review [email or web address].   This document was prepared using Dragon voice recognition software and may contain some unintended transcription errors.

## 2024-05-25 NOTE — Progress Notes (Signed)
   05/25/24 2034  BiPAP/CPAP/SIPAP  BiPAP/CPAP/SIPAP Pt Type Adult  BiPAP/CPAP/SIPAP Resmed  Mask Type Nasal mask  Dentures removed? Not applicable  Mask Size Medium  FiO2 (%) 21 %  Patient Home Machine No  Patient Home Mask No  Patient Home Tubing No  Auto Titrate Yes  Minimum cmH2O 4 cmH2O  Maximum cmH2O 20 cmH2O  Device Plugged into RED Power Outlet Yes  BiPAP/CPAP /SiPAP Vitals  Pulse Rate 69  Resp 18  SpO2 96 %  Bilateral Breath Sounds Clear  MEWS Score/Color  MEWS Score 0  MEWS Score Color Landy

## 2024-05-25 NOTE — Plan of Care (Signed)

## 2024-05-25 NOTE — ED Triage Notes (Signed)
 Pt BIB EMS from [facility]. c/o abd pain, kidney pain . Was here a week ago and pain is back for 2 days now. Pt says its worse than it was the last time  EMS vitals  BP 122/78 HR 68 SPO2 98 RA CBG 115

## 2024-05-25 NOTE — ED Provider Notes (Signed)
 [place] EMERGENCY DEPARTMENT AT [facility] Provider Note   CSN: [id number] Arrival date & time: 05/25/24  1035     Patient presents with: Flank Pain   Judy Robles is a 48 y.o. female.    Flank Pain    Patient presents because of nausea vomiting diarrhea.  Patient states that symptoms been ongoing over the past 1 to 2 days.  Multiple episodes of diarrhea.  Multiple episodes emesis.  Not able to tolerate p.o.  Denies all chest pain or shortness of breath.  No cough.  Does endorse some fever chills over the past day as well.  Endorsing some right lower abdominal pain as well as right flank pain.  Decreased urine output.  No skin breakdown that she is aware of.  No recent alcohol use.  Does endorse cocaine use yesterday.  No clear chest pain or hemoptysis.     Previous medical history reviewed : She was last admitted and discharged on September 28.  Was admitted in the setting of acute renal failure.  Likely in setting of prerenal azotemia in the setting of dehydration and vomiting and diarrhea.  Creatinine improved to 1.25.  Elevated liver enzymes.  Likely secondary to heavy alcohol use.   Prior to Admission medications   Medication Sig Start Date End Date Taking? Authorizing Provider  gabapentin  (NEURONTIN ) 400 MG capsule Take 1 capsule (400 mg total) by mouth 3 (three) times daily. 05/04/24   Amin, Ankit C, MD  oxyCODONE  (OXY IR/ROXICODONE ) 5 MG immediate release tablet Take 1 tablet (5 mg total) by mouth every 6 (six) hours as needed for moderate pain (pain score 4-6) or severe pain (pain score 7-10). 05/19/24   [place], Eric J, DO  senna (SENOKOT) 8.6 MG TABS tablet Take 2 tablets (17.2 mg total) by mouth 2 (two) times daily as needed for mild constipation or moderate constipation. 05/04/24   Amin, Ankit C, MD  Vitamin D , Ergocalciferol , (DRISDOL ) 1.25 MG (50000 UNIT) CAPS capsule Take 1 capsule (50,000 Units total) by mouth every 7 (seven) days. 05/05/24   White, Patrice  L, NP    Allergies: Gramineae pollens, Lisinopril , and Mirtazapine     Review of Systems  Genitourinary:  Positive for flank pain.    Updated Vital Signs BP (!) 159/125   Pulse 65   Temp 100.1 F (37.8 C)   Resp 14   Ht 5' 1 (1.549 m)   Wt 108.9 kg   BMI 45.36 kg/m   Physical Exam Vitals and nursing note reviewed.  Constitutional:      General: She is not in acute distress.    Appearance: She is well-developed.  HENT:     Head: Normocephalic and atraumatic.  Eyes:     Conjunctiva/sclera: Conjunctivae normal.  Cardiovascular:     Rate and Rhythm: Normal rate and regular rhythm.     Heart sounds: No murmur heard. Pulmonary:     Effort: Pulmonary effort is normal. No respiratory distress.     Breath sounds: Normal breath sounds.  Abdominal:     Palpations: Abdomen is soft.     Tenderness: There is no abdominal tenderness.   Musculoskeletal:        General: No swelling.     Cervical back: Neck supple.  Skin:    General: Skin is warm and dry.     Capillary Refill: Capillary refill takes less than 2 seconds.  Neurological:     Mental Status: She is alert.  Psychiatric:  Mood and Affect: Mood normal.     (all labs ordered are listed, but only abnormal results are displayed) Labs Reviewed  URINALYSIS, W/ REFLEX TO CULTURE (INFECTION SUSPECTED) - Abnormal; Notable for the following components:      Result Value   APPearance TURBID (*)    Hgb urine dipstick MODERATE (*)    Protein, ur >=300 (*)    Leukocytes,Ua MODERATE (*)    Non Squamous Epithelial 0-5 (*)    All other components within normal limits  RESP PANEL BY RT-PCR (RSV, FLU A&B, COVID)  RVPGX2  CULTURE, BLOOD (ROUTINE X 2)  CULTURE, BLOOD (ROUTINE X 2)  URINE CULTURE  COMPREHENSIVE METABOLIC PANEL WITH GFR  CBC WITH DIFFERENTIAL/PLATELET  PROTIME-INR  HCG, SERUM, QUALITATIVE  I-STAT CG4 LACTIC ACID, ED    EKG: None  Radiology: CT ABDOMEN PELVIS W CONTRAST Result Date:  05/25/2024 CLINICAL DATA:  Right flank pain. Evaluate for stone or appendicitis. EXAM: CT ABDOMEN AND PELVIS WITH CONTRAST TECHNIQUE: Multidetector CT imaging of the abdomen and pelvis was performed using the standard protocol following bolus administration of intravenous contrast. RADIATION DOSE REDUCTION: This exam was performed according to the departmental dose-optimization program which includes automated exposure control, adjustment of the mA and/or kV according to patient size and/or use of iterative reconstruction technique. CONTRAST:  OMNIPAQUE IOHEXOL 300 MG/ML  SOLN COMPARISON:  05/17/2024 FINDINGS: Lower chest: 4 mm right lower lobe subpleural nodule on 15/5 is stable, potentially a subpleural lymph node. 4 mm left lower lobe nodule on 23/5 was not definitely seen previously. 4 mm more anterior left lower lobe pulmonary nodule is seen on the same image, similar to prior. Other scattered tiny pulmonary nodules are seen in both lower lungs. No pleural effusion. Hepatobiliary: No suspicious focal abnormality within the liver parenchyma. Nodular liver contour is most pronounced over the left hepatic lobe and suggests underlying cirrhosis. There is no evidence for gallstones, gallbladder wall thickening, or pericholecystic fluid. Common bile duct diameter 7 mm is borderline to mildly increased. Pancreas: No focal mass lesion. No dilatation of the main duct. No intraparenchymal cyst. No peripancreatic edema. Spleen: Calcified granuloma noted anteriorly. Adrenals/Urinary Tract: No adrenal nodule or mass. Areas of focal cortical scarring are seen in both kidneys wedge-shaped area of striated decreased perfusion in the interpolar left kidney (images 12 and 13 of series 11 is suspicious for pyelonephritis. Similar possible segmental edema seen posterior right kidney on 15/11. No evidence for hydroureter. Bladder wall is ill-defined without substantial thickening. Stomach/Bowel: Circumferential wall  thickening noted distal esophagus. Small hiatal hernia noted. Stomach is unremarkable. No gastric wall thickening. No evidence of outlet obstruction. Duodenum is normally positioned as is the ligament of Treitz. No small bowel wall thickening. No small bowel dilatation. The terminal ileum is normal. The appendix is normal. No gross colonic mass. No colonic wall thickening. Vascular/Lymphatic: There is mild atherosclerotic calcification of the abdominal aorta without aneurysm. Mildly enlarged 11 mm short axis portal caval lymph node on 23/2 is stable and may be reactive. Additional small hepatoduodenal ligament lymph nodes evident. No retroperitoneal lymphadenopathy. No pelvic sidewall lymphadenopathy. Reproductive: The uterus is unremarkable.  There is no adnexal mass. Other: No intraperitoneal free fluid. Musculoskeletal: No worrisome lytic or sclerotic osseous abnormality. IMPRESSION: 1. Areas of focal cortical scarring in both kidneys with wedge-shaped area of striated decreased perfusion in the interpolar left kidney and posterior right kidney. Imaging features are suspicious for pyelonephritis. 2. No evidence for urinary stone disease. No secondary changes in  either kidney or ureter. 3. Normal appendix. 4. Nodular liver contour suggests underlying cirrhosis. 5. Circumferential wall thickening in the distal esophagus with small hiatal hernia. Esophagitis would be a consideration. 6. Multiple tiny bilateral lower lobe pulmonary nodules measuring up to 4 mm. Some of these are stable in the short interval since prior study. Previous CT chest of 09/03/2017 showed relatively diffuse bilateral airspace disease. As such, the findings may reflect scarring. No follow-up needed if patient is low-risk (and has no known or suspected primary neoplasm). Non-contrast chest CT can be considered in 12 months if patient is high-risk. This recommendation follows the consensus statement: Guidelines for Management of Incidental  Pulmonary Nodules Detected on CT Images: From the Fleischner Society 2017; Radiology 2017; 284:228-243. 7.  Aortic Atherosclerosis (ICD10-I70.0). Electronically Signed   By: Camellia Candle M.D.   On: 05/25/2024 13:29   DG Chest Port 1 View Result Date: 05/25/2024 CLINICAL DATA:  Questionable sepsis - evaluate for abnormality EXAM: PORTABLE CHEST 1 VIEW COMPARISON:  Chest x-ray 07/22/2023 FINDINGS: The heart and mediastinal contours are unchanged. Atherosclerotic plaque. No focal consolidation. No pulmonary edema. No pleural effusion. No pneumothorax. No acute osseous abnormality. IMPRESSION: 1. No active disease. 2.  Aortic Atherosclerosis (ICD10-I70.0). Electronically Signed   By: Morgane  Naveau M.D.   On: 05/25/2024 11:36     Procedures   Medications Ordered in the ED  cefTRIAXone  (ROCEPHIN ) 1 g in sodium chloride  0.9 % 100 mL IVPB (1 g Intravenous New Bag/Given 05/25/24 1511)  sodium chloride  0.9 % bolus 1,000 mL (1,000 mLs Intravenous New Bag/Given 05/25/24 1155)  cefTRIAXone  (ROCEPHIN ) 2 g in sodium chloride  0.9 % 100 mL IVPB (0 g Intravenous Stopped 05/25/24 1225)  morphine  (PF) 2 MG/ML injection 2 mg (2 mg Intravenous Given 05/25/24 1134)  iohexol (OMNIPAQUE) 300 MG/ML solution 100 mL (100 mLs Intravenous Contrast Given 05/25/24 1302)  ketorolac  (TORADOL ) 15 MG/ML injection 15 mg (15 mg Intravenous Given 05/25/24 1505)  ondansetron  (ZOFRAN ) injection 4 mg (4 mg Intravenous Given 05/25/24 1505)    Clinical Course as of 05/25/24 1514  Sat May 25, 2024  1449 DG Chest [place] 1 View [TL]    Clinical Course User Index [TL] Simon Lavonia SAILOR, MD                                 Medical Decision Making Amount and/or Complexity of Data Reviewed Labs: ordered. Radiology: ordered. Decision-making details documented in ED Course.  Risk Prescription drug management.     Patient presents because of nausea vomiting diarrhea.  Patient states that symptoms been ongoing over the past 1 to 2 days.   Multiple episodes of diarrhea.  Multiple episodes emesis.  Not able to tolerate p.o.  Denies all chest pain or shortness of breath.  No cough.  Does endorse some fever chills over the past day as well.  Endorsing some right lower abdominal pain as well as right flank pain.  Decreased urine output.  No skin breakdown that she is aware of.  No recent alcohol use.  Does endorse cocaine use yesterday.  No clear chest pain or hemoptysis.     Previous medical history reviewed : She was last admitted and discharged on September 28.  Was admitted in the setting of acute renal failure.  Likely in setting of prerenal azotemia in the setting of dehydration and vomiting and diarrhea.  Creatinine improved to 1.25.  Elevated liver enzymes.  Likely  secondary to heavy alcohol use.   Upon exam, patient slightly febrile.  100.4 temperature.  Patient has pain to palpation in the right lower quadrant as well as right flank.   No Leukocytosis or left shift.  Lactic acid negative  To obtain UA.  Questionable UTI.  Does have leuk esterase as well as white blood cell clumps.  But no bacteria.  Unclear how to interpret this at this time.  Did obtain CT scan later.  CT scan showed concerns for possible pyelonephritis.  This would explain patient's pain in this area.  Given this finding on the CT scan as well as questionable urine, we will go ahead and treat empirically with ceftriaxone  to cover for pyelonephritis.  Given pain as well as inability to obtain her medications outpatient, she is a good candidate to stay inpatient admission in the setting of concern for pyelonephritis.     Final diagnoses:  Pyelonephritis    ED Discharge Orders     None          Simon Lavonia SAILOR, MD 05/25/24 [id number]

## 2024-05-26 DIAGNOSIS — N10 Acute tubulo-interstitial nephritis: Secondary | ICD-10-CM | POA: Diagnosis not present

## 2024-05-26 LAB — CBC
HCT: 37.8 % (ref 36.0–46.0)
Hemoglobin: 11.9 g/dL — ABNORMAL LOW (ref 12.0–15.0)
MCH: 31.5 pg (ref 26.0–34.0)
MCHC: 31.5 g/dL (ref 30.0–36.0)
MCV: 100 fL (ref 80.0–100.0)
Platelets: 159 K/uL (ref 150–400)
RBC: 3.78 MIL/uL — ABNORMAL LOW (ref 3.87–5.11)
RDW: 12.5 % (ref 11.5–15.5)
WBC: 6.3 K/uL (ref 4.0–10.5)
nRBC: 0 % (ref 0.0–0.2)

## 2024-05-26 LAB — IRON AND TIBC
Iron: 108 ug/dL (ref 28–170)
Saturation Ratios: 32 % — ABNORMAL HIGH (ref 10.4–31.8)
TIBC: 339 ug/dL (ref 250–450)
UIBC: 231 ug/dL

## 2024-05-26 LAB — COMPREHENSIVE METABOLIC PANEL WITH GFR
ALT: 28 U/L (ref 0–44)
AST: 25 U/L (ref 15–41)
Albumin: 3.4 g/dL — ABNORMAL LOW (ref 3.5–5.0)
Alkaline Phosphatase: 45 U/L (ref 38–126)
Anion gap: 10 (ref 5–15)
BUN: 17 mg/dL (ref 6–20)
CO2: 22 mmol/L (ref 22–32)
Calcium: 8.5 mg/dL — ABNORMAL LOW (ref 8.9–10.3)
Chloride: 107 mmol/L (ref 98–111)
Creatinine, Ser: 0.97 mg/dL (ref 0.44–1.00)
GFR, Estimated: >60 mL/min (ref >60)
Glucose, Bld: 94 mg/dL (ref 70–99)
Potassium: 4.2 mmol/L (ref 3.5–5.1)
Sodium: 138 mmol/L (ref 135–145)
Total Bilirubin: 0.3 mg/dL (ref 0.0–1.2)
Total Protein: 6.3 g/dL — ABNORMAL LOW (ref 6.5–8.1)

## 2024-05-26 LAB — PHOSPHORUS: Phosphorus: 3.7 mg/dL (ref 2.5–4.6)

## 2024-05-26 LAB — MAGNESIUM: Magnesium: 1.9 mg/dL (ref 1.7–2.4)

## 2024-05-26 MED ORDER — OXYCODONE HCL 5 MG PO TABS
5.0000 mg | ORAL_TABLET | Freq: Four times a day (QID) | ORAL | Status: DC | PRN
  Administered 2024-05-26 – 2024-05-27 (×4): 5 mg via ORAL
  Filled 2024-05-26 (×4): qty 1

## 2024-05-26 MED ORDER — PANTOPRAZOLE SODIUM 40 MG PO TBEC
40.0000 mg | DELAYED_RELEASE_TABLET | Freq: Two times a day (BID) | ORAL | Status: DC
  Administered 2024-05-26 – 2024-05-31 (×11): 40 mg via ORAL
  Filled 2024-05-26 (×11): qty 1

## 2024-05-26 NOTE — Plan of Care (Signed)
?  Problem: Health Behavior/Discharge Planning: ?Goal: Ability to manage health-related needs will improve ?Outcome: Progressing ?  ?Problem: Activity: ?Goal: Risk for activity intolerance will decrease ?Outcome: Progressing ?  ?Problem: Elimination: ?Goal: Will not experience complications related to bowel motility ?Outcome: Progressing ?  ?

## 2024-05-26 NOTE — Progress Notes (Signed)
 [facility] Progress Note  Patient: Judy Robles    FMW:[id number]  DOA: 05/25/2024     Date of Service: the patient was seen and examined on 05/26/2024  Chief Complaint  Patient presents with   Flank Pain   Brief hospital course: Judy Robles is a 48 y.o. female with medical history significant of osteoarthritis, bipolar 1 disorder, depression, PTSD, hyperlipidemia, hypertension, MRSA infection, history of symptomatic bradycardia, alcohol abuse, benzodiazepine abuse, drug overdose, cocaine abuse, opioid abuse, unspecified renal disease, sleep apnea not on CPAP who was recently discharged from the hospital continue to AKI who presented to the emergency department complaints of bilateral flank pain for several days and fever since earlier today.  No dysuria, frequency or hematuria.  She also had chills, nausea, emesis, diarrhea, myalgias and arthralgias but her GI symptoms were present before the fever and flank pain.  No night sweats, rhinorrhea, sore throat, wheezing or hemoptysis.  No chest pain, palpitations, diaphoresis, PND, orthopnea or pitting edema of the lower extremities.  No  constipation, melena or hematochezia.  No polyuria, polydipsia, polyphagia or blurred vision.    ED course: Initial vital signs were temperature 100.4 F, pulse 65, respiration 14, BP 159/125 mmHg and O2 sat.  The patient received ceftriaxone  2 g IVPB, ceftriaxone  1 g IVPB, ketorolac  15 mg IVP, morphine  2 mg IVP, ondansetron  4 mg IVP and 1000 mL of normal saline bolus.   Lab work: Urinalysis was turbid, with moderate hemoglobin and moderate leukocyte esterase and greater than 300 mg/dL protein.  Microscopic examination with no bacteria, but greater than 50 RBC, greater than 50 WBC, positive WBC clumps and mucus.  CBC, PT/INR, CMP and lactic acid were normal.  Negative coronavirus, influenza and RSV PCR test.  Serum pregnancy test was negative.   Imaging: Portable 1 view chest radiograph with no active disease.   Aortic atherosclerosis.  CT abdomen/pelvis with contrast with suspicion for bilateral areas of pyelonephritis.  No repeat cyst.  No secondary changes seen in either kidney or ureter.  Normal appendix.  New cirrhosis.  Distal esophageal wall thickening with small hiatal hernia suspicious for esophagitis.  Multiple bilateral lower lobe pulmonary nodules.  Noncontrast chest CT follow-up in 12 months if high risk.  Aortic atherosclerosis.    Assessment and Plan:  # Acute pyelonephritis due to Proteus Mirabella's Continue ceftriaxone  2 g IVPB. Blood culture NGTD. Urine cultures growing Proteus mirabilis, follow sensitivity report Analgesics as needed. Antiemetics as needed. Advised to cease or decrease cola sodas consumption. Follow CBC, CMP in AM.    # Essential hypertension Monitor blood pressure.   Antihypertensives as needed.    # Diarrhea Continue IV fluids.   # Alcohol use disorder According to the patient: -No recent alcohol consumption since prior discharge.   # Polysubstance abuse (HCC) Stated she used cocaine earlier this week. She denied any other recreational drugs. Advised to cease and seek counseling.   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 Patient was not aware of liver cirrhosis, CT scan shows cirrhosis of liver.  Patient was informed regarding the diagnosis Alcohol abstinence counseling done.  # Pulmonary nodules, incidental finding. Follow-up PCP to repeat CT chest after 12 months. Patient is high risk due to history of smoking.  # GERD, started PPI   Body mass index is 41.03 kg/m.  Interventions:  Diet: Regular diet DVT Prophylaxis: Subcutaneous Lovenox    Advance goals of care discussion: Full code  Family Communication: family was present at bedside, at the time  of interview.  The pt provided permission to discuss medical plan with the family. Opportunity was given to ask question and all questions were answered satisfactorily.   Disposition:   Pt is from community, homeless, admitted with bilateral pyelonephritis, still on IV antibiotics, which precludes a safe discharge. Discharge to shelter, when stable, may need 1-2 more days to improve. TOC consulted for dispo plan  Subjective: No significant events overnight.  Patient still complaining of pain in the right flank 9/10 and no new other complaints.  Patient presented with frequency of urination and fever and chills, which is resolved. Patient is interested in inpatient rehab due to cocaine abuse history, she would like to have information regarding cocaine addiction treatment program.  TOC consulted.   Physical Exam: General: NAD, lying comfortably Appear in no distress, affect appropriate Eyes: PERRLA ENT: Oral Mucosa Clear, moist  Neck: no JVD,  Cardiovascular: S1 and S2 Present, no Murmur,  Respiratory: good respiratory effort, Bilateral Air entry equal and Decreased, no Crackles, no wheezes Abdomen: BS present, Soft and Right flank tenderness,  Skin: no rashes Extremities: no Pedal edema, no calf tenderness Neurologic: without any new focal findings Gait not checked due to patient safety concerns  Vitals:   05/25/24 2034 05/26/24 0003 05/26/24 0446 05/26/24 1225  BP:  128/60 134/67 (!) 183/61  Pulse: 69 61 61 62  Resp: 18 18 18 16   Temp:  99.1 F (37.3 C) 98.5 F (36.9 C) 98.5 F (36.9 C)  TempSrc:  Oral Oral Oral  SpO2: 96% 98% 98% 99%  Weight:      Height:        Intake/Output Summary (Last 24 hours) at 05/26/2024 1421 Last data filed at 05/25/2024 1600 Gross per 24 hour  Intake 558.19 ml  Output --  Net 558.19 ml   Filed Weights   05/25/24 1048 05/25/24 1553  Weight: 108.9 kg 98.5 kg    Data Reviewed: I have personally reviewed and interpreted daily labs, tele strips, imagings as discussed above. I reviewed all nursing notes, pharmacy notes, vitals, pertinent old records I have discussed plan of care as described above with RN and  patient/family.  CBC: Recent Labs  Lab 05/25/24 1150 05/26/24 0541  WBC 10.3 6.3  NEUTROABS 7.2  --   HGB 13.3 11.9*  HCT 41.7 37.8  MCV 99.0 100.0  PLT 200 159   Basic Metabolic Panel: Recent Labs  Lab 05/25/24 1150 05/26/24 0541  NA 139 138  K 3.7 4.2  CL 103 107  CO2 24 22  GLUCOSE 89 94  BUN 11 17  CREATININE 0.98 0.97  CALCIUM  9.6 8.5*  MG  --  1.9  PHOS  --  3.7    Studies: No results found.  Scheduled Meds:  busPIRone  10 mg Oral TID   enoxaparin  (LOVENOX ) injection  40 mg Subcutaneous Q24H   Continuous Infusions:  sodium chloride  100 mL/hr at 05/25/24 2131   cefTRIAXone  (ROCEPHIN )  IV     PRN Meds: acetaminophen  **OR** acetaminophen , morphine  injection, ondansetron  **OR** ondansetron  (ZOFRAN ) IV, oxyCODONE   Time spent: 35 minutes  Author: ELVAN SOR. MD [facility] 05/26/2024 2:21 PM  To reach On-call, see care teams to locate the attending and reach out to them via [email or web address]. If 7PM-7AM, please contact night-coverage If you still have difficulty reaching the attending provider, please page the [facility] (Director on Call) for [facility] on amion for assistance.

## 2024-05-26 NOTE — Progress Notes (Signed)
   05/26/24 2007  BiPAP/CPAP/SIPAP  Reason BIPAP/CPAP not in use Non-compliant (machine not in room)

## 2024-05-27 DIAGNOSIS — N10 Acute tubulo-interstitial nephritis: Secondary | ICD-10-CM | POA: Diagnosis not present

## 2024-05-27 LAB — URINE CULTURE: Culture: >=100000 — AB

## 2024-05-27 LAB — CBC
HCT: 38.3 % (ref 36.0–46.0)
Hemoglobin: 12 g/dL (ref 12.0–15.0)
MCH: 31.5 pg (ref 26.0–34.0)
MCHC: 31.3 g/dL (ref 30.0–36.0)
MCV: 100.5 fL — ABNORMAL HIGH (ref 80.0–100.0)
Platelets: 159 K/uL (ref 150–400)
RBC: 3.81 MIL/uL — ABNORMAL LOW (ref 3.87–5.11)
RDW: 12.4 % (ref 11.5–15.5)
WBC: 5.3 K/uL (ref 4.0–10.5)
nRBC: 0 % (ref 0.0–0.2)

## 2024-05-27 LAB — BASIC METABOLIC PANEL WITH GFR
Anion gap: 8 (ref 5–15)
BUN: 16 mg/dL (ref 6–20)
CO2: 19 mmol/L — ABNORMAL LOW (ref 22–32)
Calcium: 8.1 mg/dL — ABNORMAL LOW (ref 8.9–10.3)
Chloride: 108 mmol/L (ref 98–111)
Creatinine, Ser: 0.94 mg/dL (ref 0.44–1.00)
GFR, Estimated: >60 mL/min (ref >60)
Glucose, Bld: 81 mg/dL (ref 70–99)
Potassium: 4.3 mmol/L (ref 3.5–5.1)
Sodium: 135 mmol/L (ref 135–145)

## 2024-05-27 LAB — PHOSPHORUS: Phosphorus: 3.5 mg/dL (ref 2.5–4.6)

## 2024-05-27 LAB — MAGNESIUM: Magnesium: 2.1 mg/dL (ref 1.7–2.4)

## 2024-05-27 MED ORDER — OXYCODONE HCL 5 MG PO TABS
5.0000 mg | ORAL_TABLET | Freq: Three times a day (TID) | ORAL | Status: DC | PRN
  Administered 2024-05-27 – 2024-05-31 (×12): 5 mg via ORAL
  Filled 2024-05-27 (×12): qty 1

## 2024-05-27 MED ORDER — CEFADROXIL 500 MG PO CAPS
1000.0000 mg | ORAL_CAPSULE | Freq: Two times a day (BID) | ORAL | Status: DC
Start: 2024-05-27 — End: 2024-06-01
  Administered 2024-05-27 – 2024-05-31 (×8): 1000 mg via ORAL
  Filled 2024-05-27 (×11): qty 2

## 2024-05-27 NOTE — Progress Notes (Signed)
   05/27/24 2131  BiPAP/CPAP/SIPAP  BiPAP/CPAP/SIPAP Pt Type Adult  Reason BIPAP/CPAP not in use Non-compliant (Patient refused CPAP qhs.  Patient states that she was unable to tolerate it the one night she tried it here.)

## 2024-05-27 NOTE — Plan of Care (Signed)

## 2024-05-27 NOTE — Plan of Care (Signed)
  Problem: Education: Goal: Knowledge of General Education information will improve Description: Including pain rating scale, medication(s)/side effects and non-pharmacologic comfort measures Outcome: Progressing   Problem: Clinical Measurements: Goal: Will remain free from infection Outcome: Progressing   Problem: Activity: Goal: Risk for activity intolerance will decrease Outcome: Progressing   Problem: Nutrition: Goal: Adequate nutrition will be maintained Outcome: Progressing   Problem: Coping: Goal: Level of anxiety will decrease Outcome: Progressing   Problem: Pain Managment: Goal: General experience of comfort will improve and/or be controlled Outcome: Progressing   Problem: Safety: Goal: Ability to remain free from injury will improve Outcome: Progressing

## 2024-05-27 NOTE — Progress Notes (Signed)
 [facility] Progress Note  Patient: Judy Robles    FMW:[id number]  DOA: 05/25/2024     Date of Service: the patient was seen and examined on 05/27/2024  Chief Complaint  Patient presents with   Flank Pain   Brief hospital course: Judy Robles is a 48 y.o. female with medical history significant of osteoarthritis, bipolar 1 disorder, depression, PTSD, hyperlipidemia, hypertension, MRSA infection, history of symptomatic bradycardia, alcohol abuse, benzodiazepine abuse, drug overdose, cocaine abuse, opioid abuse, unspecified renal disease, sleep apnea not on CPAP who was recently discharged from the hospital continue to AKI who presented to the emergency department complaints of bilateral flank pain for several days and fever since earlier today.  No dysuria, frequency or hematuria.  She also had chills, nausea, emesis, diarrhea, myalgias and arthralgias but her GI symptoms were present before the fever and flank pain.  No night sweats, rhinorrhea, sore throat, wheezing or hemoptysis.  No chest pain, palpitations, diaphoresis, PND, orthopnea or pitting edema of the lower extremities.  No  constipation, melena or hematochezia.  No polyuria, polydipsia, polyphagia or blurred vision.    ED course: Initial vital signs were temperature 100.4 F, pulse 65, respiration 14, BP 159/125 mmHg and O2 sat.  The patient received ceftriaxone  2 g IVPB, ceftriaxone  1 g IVPB, ketorolac  15 mg IVP, morphine  2 mg IVP, ondansetron  4 mg IVP and 1000 mL of normal saline bolus.   Lab work: Urinalysis was turbid, with moderate hemoglobin and moderate leukocyte esterase and greater than 300 mg/dL protein.  Microscopic examination with no bacteria, but greater than 50 RBC, greater than 50 WBC, positive WBC clumps and mucus.  CBC, PT/INR, CMP and lactic acid were normal.  Negative coronavirus, influenza and RSV PCR test.  Serum pregnancy test was negative.   Imaging: Portable 1 view chest radiograph with no active disease.   Aortic atherosclerosis.  CT abdomen/pelvis with contrast with suspicion for bilateral areas of pyelonephritis.  No repeat cyst.  No secondary changes seen in either kidney or ureter.  Normal appendix.  New cirrhosis.  Distal esophageal wall thickening with small hiatal hernia suspicious for esophagitis.  Multiple bilateral lower lobe pulmonary nodules.  Noncontrast chest CT follow-up in 12 months if high risk.  Aortic atherosclerosis.    Assessment and Plan:  # Acute pyelonephritis due to Proteus Mirabella's S/p ceftriaxone  2 g IVPB. Blood culture NGTD. Urine cultures growing Proteus mirabilis, sensitive to cephalosporin.  10/6 transition to cefadroxil  1000 mg p.o. twice daily for 5 days to complete 7-day course. Analgesics as needed. Antiemetics as needed. Advised to cease or decrease cola sodas consumption. Follow CBC, CMP in AM.    # Essential hypertension Monitor blood pressure.   Antihypertensives as needed.    # Diarrhea S/p IV fluids.  Continue oral hydration   # Alcohol use disorder According to the patient: -No recent alcohol consumption since prior discharge.   # Polysubstance abuse (HCC) Stated she used cocaine earlier this week. She denied any other recreational drugs. Advised to cease and seek counseling.   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 Patient was not aware of liver cirrhosis, CT scan shows cirrhosis of liver.  Patient was informed regarding the diagnosis Alcohol abstinence counseling done.  # Pulmonary nodules, incidental finding. Follow-up PCP to repeat CT chest after 12 months. Patient is high risk due to history of smoking.  # GERD, started PPI   Body mass index is 41.03 kg/m.  Interventions:  Diet: Regular diet DVT Prophylaxis: Subcutaneous  Lovenox    Advance goals of care discussion: Full code  Family Communication: family was present at bedside, at the time of interview.  The pt provided permission to discuss medical plan with the  family. Opportunity was given to ask question and all questions were answered satisfactorily.   Disposition:  Pt is from community, homeless, admitted with bilateral pyelonephritis, still on IV antibiotics, which precludes a safe discharge. Discharge to shelter, when stable, most likely discharge tomorrow a.m. if pain is under control TOC consulted for dispo plan  Subjective: No significant events overnight.  Patient still complaining of pain in the right lower quadrant, 8/10.  Denied any other complaints.   Physical Exam: General: NAD, lying comfortably Appear in no distress, affect appropriate Eyes: PERRLA ENT: Oral Mucosa Clear, moist  Neck: no JVD,  Cardiovascular: S1 and S2 Present, no Murmur,  Respiratory: good respiratory effort, Bilateral Air entry equal and Decreased, no Crackles, no wheezes Abdomen: BS present, Soft and mild LLQ tenderness,  Skin: no rashes Extremities: no Pedal edema, no calf tenderness Neurologic: without any new focal findings Gait not checked due to patient safety concerns  Vitals:   05/26/24 1225 05/26/24 1640 05/26/24 1949 05/27/24 0440  BP: (!) 183/61 (!) 143/67 (!) 155/73 (!) 140/70  Pulse: 62 (!) 54 68 (!) 57  Resp: 16 16    Temp: 98.5 F (36.9 C) 98.6 F (37 C) 98.5 F (36.9 C) 98 F (36.7 C)  TempSrc: Oral Oral    SpO2: 99% 98% 99% 99%  Weight:      Height:        Intake/Output Summary (Last 24 hours) at 05/27/2024 1328 Last data filed at 05/26/2024 1601 Gross per 24 hour  Intake 1105.07 ml  Output 1000 ml  Net 105.07 ml   Filed Weights   05/25/24 1048 05/25/24 1553  Weight: 108.9 kg 98.5 kg    Data Reviewed: I have personally reviewed and interpreted daily labs, tele strips, imagings as discussed above. I reviewed all nursing notes, pharmacy notes, vitals, pertinent old records I have discussed plan of care as described above with RN and patient/family.  CBC: Recent Labs  Lab 05/25/24 1150 05/26/24 0541 05/27/24 0420   WBC 10.3 6.3 5.3  NEUTROABS 7.2  --   --   HGB 13.3 11.9* 12.0  HCT 41.7 37.8 38.3  MCV 99.0 100.0 100.5*  PLT 200 159 159   Basic Metabolic Panel: Recent Labs  Lab 05/25/24 1150 05/26/24 0541 05/27/24 0420  NA 139 138 135  K 3.7 4.2 4.3  CL 103 107 108  CO2 24 22 19*  GLUCOSE 89 94 81  BUN 11 17 16   CREATININE 0.98 0.97 0.94  CALCIUM  9.6 8.5* 8.1*  MG  --  1.9 2.1  PHOS  --  3.7 3.5    Studies: No results found.  Scheduled Meds:  busPIRone  10 mg Oral TID   cefadroxil   1,000 mg Oral BID   enoxaparin  (LOVENOX ) injection  40 mg Subcutaneous Q24H   pantoprazole   40 mg Oral BID   Continuous Infusions:   PRN Meds: acetaminophen  **OR** acetaminophen , morphine  injection, ondansetron  **OR** ondansetron  (ZOFRAN ) IV, oxyCODONE   Time spent: 35 minutes  Author: ELVAN SOR. MD [facility] 05/27/2024 1:28 PM  To reach On-call, see care teams to locate the attending and reach out to them via [email or web address]. If 7PM-7AM, please contact night-coverage If you still have difficulty reaching the attending provider, please page the [facility] (Director on Call) for [facility] on  amion for assistance.

## 2024-05-27 NOTE — Evaluation (Signed)
 Physical Therapy Brief Evaluation and Discharge Note Patient Details Name: Judy Robles MRN: [id number] DOB: [birth year] Today's Date: 05/27/2024   History of Present Illness  48 yo female admitted with pyelonephritis. Hx of polysubstance abuse, PTSD, bipolar d/o, RLS, MDD  Clinical Impression  Ind with mobility.        PT Assessment    Assistance Needed at Discharge       Equipment Recommendations None recommended by PT  Recommendations for Other Services       Precautions/Restrictions Precautions Precautions: None Restrictions Weight Bearing Restrictions Per Provider Order: No        Mobility  Bed Mobility          Transfers Overall transfer level: Independent                      Ambulation/Gait Ambulation/Gait assistance: Independent Gait Distance (Feet): 200 Feet Assistive device: None Gait Pattern/deviations: WFL(Within Functional Limits)      Home Activity Instructions    Stairs            Modified Rankin (Stroke Patients Only)        Balance                          Pertinent Vitals/Pain   Pain Assessment Pain Assessment: Faces Faces Pain Scale: Hurts a little bit Pain Location: did not specify Pain Intervention(s): Monitored during session     Home Living               Additional Comments: pt reports she is unhoused currently.    Prior Function        UE/LE Assessment               Communication   Communication Communication: No apparent difficulties     Cognition         General Comments      Exercises     Assessment/Plan    PT Problem List         PT Visit Diagnosis      No Skilled PT     Co-evaluation                AMPAC 6 Clicks Help needed turning from your back to your side while in a flat bed without using bedrails?: None Help needed moving from lying on your back to sitting on the side of a flat bed without using bedrails?: None Help needed moving  to and from a bed to a chair (including a wheelchair)?: None Help needed standing up from a chair using your arms (e.g., wheelchair or bedside chair)?: None Help needed to walk in hospital room?: None Help needed climbing 3-5 steps with a railing? : None 6 Click Score: 24      End of Session Equipment Utilized During Treatment: Gait belt Activity Tolerance: Patient tolerated treatment well Patient left: in bed;with call bell/phone within reach         Time: 1558-1606 PT Time Calculation (min) (ACUTE ONLY): 8 min  Charges:   PT Evaluation $PT Eval Low Complexity: 1 Low          Dannial SQUIBB, PT Acute Rehabilitation  Office: [phone]

## 2024-05-28 DIAGNOSIS — N10 Acute tubulo-interstitial nephritis: Secondary | ICD-10-CM | POA: Diagnosis not present

## 2024-05-28 LAB — MAGNESIUM: Magnesium: 2.1 mg/dL (ref 1.7–2.4)

## 2024-05-28 LAB — BASIC METABOLIC PANEL WITH GFR
Anion gap: 8 (ref 5–15)
BUN: 11 mg/dL (ref 6–20)
CO2: 23 mmol/L (ref 22–32)
Calcium: 8.6 mg/dL — ABNORMAL LOW (ref 8.9–10.3)
Chloride: 107 mmol/L (ref 98–111)
Creatinine, Ser: 0.96 mg/dL (ref 0.44–1.00)
GFR, Estimated: >60 mL/min (ref >60)
Glucose, Bld: 74 mg/dL (ref 70–99)
Potassium: 4.6 mmol/L (ref 3.5–5.1)
Sodium: 138 mmol/L (ref 135–145)

## 2024-05-28 LAB — CBC
HCT: 40.4 % (ref 36.0–46.0)
Hemoglobin: 12.3 g/dL (ref 12.0–15.0)
MCH: 31.5 pg (ref 26.0–34.0)
MCHC: 30.4 g/dL (ref 30.0–36.0)
MCV: 103.3 fL — ABNORMAL HIGH (ref 80.0–100.0)
Platelets: 151 K/uL (ref 150–400)
RBC: 3.91 MIL/uL (ref 3.87–5.11)
RDW: 12.4 % (ref 11.5–15.5)
WBC: 4.3 K/uL (ref 4.0–10.5)
nRBC: 0 % (ref 0.0–0.2)

## 2024-05-28 LAB — PHOSPHORUS: Phosphorus: 3.5 mg/dL (ref 2.5–4.6)

## 2024-05-28 MED ORDER — ORAL CARE MOUTH RINSE: 15.0000 mL | OROMUCOSAL | Status: DC | PRN

## 2024-05-28 NOTE — Progress Notes (Signed)
   05/28/24 2128  BiPAP/CPAP/SIPAP  BiPAP/CPAP/SIPAP Pt Type Adult  Reason BIPAP/CPAP not in use Non-compliant (Patient continues to refuse CPAP qhs.)

## 2024-05-28 NOTE — TOC Initial Note (Addendum)
 Transition of Care [facility]) - Initial/Assessment Note    Patient Details  Name: Judy Robles MRN: [id number] Date of Birth: July 29, 1976  [facility] CM/SW Contact:    Doneta Glenys DASEN, RN Phone Number:  Clinical Narrative:                 CM spoke with patient at bedside. PTA is homeless, loss job 7 mos ago, [name] (Sister)[phone] informed patient that she can not come live with her due to no space. Patient agreeable to CM calling sister. Number was not accurate. CM called [employer] left message.Patient requested inpatient rehab. Referral/Information sent via Epic to [facility] in [place]. Waiting for response. 12:40 PM Patients sister returned call to assure that patient was ok. Sister was not able to talk and will call back at 3:00 PM to communicate further.  4:04 PM Patient's sister returned call. Sister said there is no way that patient can live with her.   Expected Discharge Plan:  (Reidential Rehab) Barriers to Discharge: Continued Medical Work up, Homeless with medical needs   Patient Goals and CMS Choice Patient states their goals for this hospitalization and ongoing recovery are:: Find a home CMS Medicare.gov Compare Post Acute Care list provided to::  (NA) Choice offered to / list presented to : NA  ownership interest in [facility].provided to:: Parent NA    Expected Discharge Plan and Services In-house Referral: NA Discharge Planning Services: CM Consult Post Acute Care Choice: NA Living arrangements for the past 2 months: Homeless                 DME Arranged: N/A DME Agency: NA       HH Arranged: NA HH Agency: NA        Prior Living Arrangements/Services Living arrangements for the past 2 months: Homeless Lives with:: Self Patient language and need for interpreter reviewed:: Yes Do you feel safe going back to the place where you live?: No   Homeless  Need for Family Participation in Patient Care: Yes  (Comment) Care giver support system in place?: Yes (comment) Current home services:  (NA) Criminal Activity/Legal Involvement Pertinent to Current Situation/Hospitalization: No - Comment as needed  Activities of Daily Living   ADL Screening (condition at time of admission) Independently performs ADLs?: Yes (appropriate for developmental age) Is the patient deaf or have difficulty hearing?: No Does the patient have difficulty seeing, even when wearing glasses/contacts?: No Does the patient have difficulty concentrating, remembering, or making decisions?: No  Permission Sought/Granted Permission sought to share information with : Case Manager Permission granted to share information with : Yes, Verbal Permission Granted  Share Information with NAME: [name] (Sister)  [phone]  Permission granted to share info w AGENCY: [facility]        Emotional Assessment Appearance:: Appears stated age Attitude/Demeanor/Rapport: Engaged, Gracious Affect (typically observed): Appropriate Orientation: : Oriented to Self, Oriented to Place, Oriented to  Time, Oriented to Situation Alcohol / Substance Use: Alcohol Use, Illicit Drugs Psych Involvement: No (comment)  Admission diagnosis:  Pyelonephritis [N12] Acute pyelonephritis [N10] Patient Active Problem List   Diagnosis Date Noted   Acute pyelonephritis 05/25/2024   Hepatitis C 05/25/2024   Elevated transaminase level 05/17/2024   Symptomatic bradycardia 05/03/2024   Renal insufficiency 05/03/2024   Suicidal ideation 05/03/2024   Substance induced mood disorder (HCC) 04/29/2024   MDD (major depressive disorder), recurrent, severe, 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   Polysubstance abuse (HCC) 01/08/2024   Alcohol  use disorder 06/25/2023   Substance-induced sleep disorder (HCC) 06/25/2023   Restless leg syndrome 06/25/2023   Opiate abuse, continuous (HCC) 06/21/2023   Mood disorder 10/20/2022   Neuropathy 10/20/2022   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) 12/21/2018   HCAP (healthcare-associated pneumonia) 09/16/2018   LLL pneumonia 09/16/2018   Sepsis (HCC) 09/16/2018   Hyponatremia 09/16/2018   Hypokalemia 09/16/2018   Homelessness unspecified 09/11/2018   Severe benzodiazepine use disorder (HCC) 07/10/2018   Bipolar II disorder (HCC)    PTSD (post-traumatic stress disorder) 11/21/2017   MDD (major depressive disorder) 11/20/2017   Community acquired pneumonia of right lower lobe of lung 09/02/2017   Sepsis associated hypotension (HCC) 09/02/2017   AKI (acute kidney injury) 09/02/2017   Nausea and vomiting 09/02/2017   Drug overdose 04/22/2017   Chest pain 10/09/2016   Pressure injury of skin 10/05/2016   Polysubstance dependence including opioid type drug with complication, continuous use (HCC) 07/21/2016   Chronic pain 07/03/2015   Diarrhea 05/01/2014   Essential hypertension 04/17/2014   PCP:  Pcp, No Pharmacy:   [facility] [phone] - [place], [place] - [address] AT [facility] [address] [place] [address] Phone: [phone] Fax: [phone]  [facility] [address] [place] [address] Phone: [phone] Fax: [phone]  [facility] - [place], [place] - [address] [place] [address] Phone: [phone] Fax: [phone]     Social Drivers of Health (SDOH) Social History: SDOH Screenings   Food Insecurity: Food Insecurity Present (05/25/2024)  Housing: High Risk (05/25/2024)  Transportation Needs: Unmet Transportation Needs (05/25/2024)  Utilities: Not At Risk (05/25/2024)  Alcohol Screen: High Risk (12/14/2023)  Depression (PHQ2-9): High Risk (05/02/2024)  Social Connections: Unknown (10/19/2022)   Received from [facility]  Tobacco Use: High Risk (05/25/2024)   SDOH Interventions:     Readmission Risk Interventions    05/28/2024    10:04 AM 05/19/2024   10:17 AM  Readmission Risk Prevention Plan  Transportation Screening Complete Complete  Medication Review Oceanographer) Complete Complete  PCP or Specialist appointment within 3-5 days of discharge  Complete  HRI or Home Care Consult Complete Complete  SW Recovery Care/Counseling Consult Complete Complete  Palliative Care Screening Not Applicable Not Applicable  Skilled Nursing Facility Not Applicable Not Applicable

## 2024-05-28 NOTE — Evaluation (Signed)
 Occupational Therapy Evaluation Patient Details Name: Judy Robles MRN: [id number] DOB: July 25, 1976 Today's Date: 05/28/2024   History of Present Illness   Patient is a 48 year old female who presented to the ED with bilateral flank pain.  Dx with pyelonephritis due to Proteus mirabella infection and started on PO Abx.  PMHx significant for PTSD, bipolar I, polysubstance abuse, ETOH     Clinical Impressions PTA, patient was completing ADL and IADL tasks independently while being in a homeless status.  Patient admitted for medical management of acute pyelonephritis.  During evaluation, patient demonstrated grooming / hygiene, LB dressing, and functional mobility tasks with independence.  Patient mentioned decreased sensation in bilateral hands, adding that she sometimes drops items.  Patient demonstrated object manipulation skills WFL.  Provided handout and education for fine motor activities to decrease dropping of items.  Patient evaluated by Occupational Therapy with no further acute OT needs identified. All education has been completed and the patient has no further questions. Follow-up OT services are not anticipated s/p discharge from hospital at this time. OT to sign off. Thank you for referral.       If plan is discharge home, recommend the following:   Other (comment) (Assistance with housing, lifestyle modifications)     Functional Status Assessment   Patient has not had a recent decline in their functional status     Equipment Recommendations   None recommended by OT      Precautions/Restrictions   Precautions Precautions: Fall Precaution/Restrictions Comments: Patient endorsed 2 recent falls but demonstrated balance WFL Restrictions Weight Bearing Restrictions Per Provider Order: No     Mobility Bed Mobility Overal bed mobility: Independent   Patient Response: Cooperative  Transfers Overall transfer level: Independent      Balance Overall balance  assessment: Independent   Standardized Balance Assessment Standardized Balance Assessment : Dynamic Gait Index   Dynamic Gait Index Level Surface: Normal Change in Gait Speed: Normal Gait with Horizontal Head Turns: Normal Gait with Vertical Head Turns: Normal Gait and Pivot Turn: Normal Step Over Obstacle: Normal Step Around Obstacles: Normal Steps: Normal Total Score: 24     ADL either performed or assessed with clinical judgement   ADL Overall ADL's : Independent;Modified independent;At baseline   General ADL Comments: Donned socks in Figure 4 position     Vision Baseline Vision/History: 1 Wears glasses Ability to See in Adequate Light: 0 Adequate Patient Visual Report: No change from baseline Vision Assessment?: No apparent visual deficits            Pertinent Vitals/Pain Pain Assessment Pain Assessment: 0-10 Pain Score: 7  Pain Location: in my side Pain Descriptors / Indicators: Aching Pain Intervention(s): Monitored during session, Patient requesting pain meds-RN notified     Extremity/Trunk Assessment Upper Extremity Assessment Upper Extremity Assessment: Right hand dominant;RUE deficits/detail;LUE deficits/detail RUE Deficits / Details: Shoulder flexion 100 deg; otherwise WFL RUE Sensation: decreased light touch LUE Deficits / Details: Shoulder flexion 100 deg; otherwise WFL LUE Sensation: decreased light touch   Lower Extremity Assessment Lower Extremity Assessment: Defer to PT evaluation   Cervical / Trunk Assessment Cervical / Trunk Assessment: Normal   Communication Communication Communication: No apparent difficulties   Cognition Arousal: Alert Behavior During Therapy: WFL for tasks assessed/performed Cognition: No apparent impairments   Following commands: Intact                  Home Living Family/patient expects to be discharged to:: Shelter/Homeless Living Arrangements: Non-relatives/Friends   Additional  Comments: Reports  in transition, getting referrals for commuity resources from case mgmt      Prior Functioning/Environment Prior Level of Function : Independent/Modified Independent   Mobility Comments: Ambulates without AD ADLs Comments: Reports independent in ADL & IADL tasks    OT Problem List: Decreased activity tolerance;Impaired sensation;Pain        OT Goals(Current goals can be found in the care plan section)   Acute Rehab OT Goals Patient Stated Goal: Find a place to stay OT Goal Formulation: With patient Potential to Achieve Goals: Good   OT Frequency:  1 visit        AM-PAC OT 6 Clicks Daily Activity     Outcome Measure Help from another person eating meals?: None Help from another person taking care of personal grooming?: None Help from another person toileting, which includes using toliet, bedpan, or urinal?: None Help from another person bathing (including washing, rinsing, drying)?: None Help from another person to put on and taking off regular upper body clothing?: None Help from another person to put on and taking off regular lower body clothing?: None 6 Click Score: 24   End of Session Equipment Utilized During Treatment: Gait belt Nurse Communication: Patient requests pain meds  Activity Tolerance: Patient tolerated treatment well Patient left: in chair;with call bell/phone within reach;with nursing/sitter in room  OT Visit Diagnosis: Pain;Other (comment) (Decreased sensation) Pain - Right/Left: Left Pain - part of body:  (Flanks)                Time: 9064-9040 OT Time Calculation (min): 24 min Charges:  OT General Charges $OT Visit: 1 Visit OT Evaluation $OT Eval Low Complexity: 1 Low OT Treatments $Therapeutic Activity: 8-22 mins  Skipper Dacosta B. Daelyn Pettaway, MS, OTR/L 05/28/2024, 10:31 AM

## 2024-05-28 NOTE — Progress Notes (Signed)
 [facility] Progress Note  Patient: Judy Robles    FMW:[id number]  DOA: 05/25/2024     Date of Service: the patient was seen and examined on 05/28/2024  Chief Complaint  Patient presents with   Flank Pain   Brief hospital course: Judy Robles is a 48 y.o. female with medical history significant of osteoarthritis, bipolar 1 disorder, depression, PTSD, hyperlipidemia, hypertension, MRSA infection, history of symptomatic bradycardia, alcohol abuse, benzodiazepine abuse, drug overdose, cocaine abuse, opioid abuse, unspecified renal disease, sleep apnea not on CPAP who was recently discharged from the hospital continue to AKI who presented to the emergency department complaints of bilateral flank pain for several days and fever since earlier today.  No dysuria, frequency or hematuria.  She also had chills, nausea, emesis, diarrhea, myalgias and arthralgias but her GI symptoms were present before the fever and flank pain.  No night sweats, rhinorrhea, sore throat, wheezing or hemoptysis.  No chest pain, palpitations, diaphoresis, PND, orthopnea or pitting edema of the lower extremities.  No  constipation, melena or hematochezia.  No polyuria, polydipsia, polyphagia or blurred vision.    ED course: Initial vital signs were temperature 100.4 F, pulse 65, respiration 14, BP 159/125 mmHg and O2 sat.  The patient received ceftriaxone  2 g IVPB, ceftriaxone  1 g IVPB, ketorolac  15 mg IVP, morphine  2 mg IVP, ondansetron  4 mg IVP and 1000 mL of normal saline bolus.   Lab work: Urinalysis was turbid, with moderate hemoglobin and moderate leukocyte esterase and greater than 300 mg/dL protein.  Microscopic examination with no bacteria, but greater than 50 RBC, greater than 50 WBC, positive WBC clumps and mucus.  CBC, PT/INR, CMP and lactic acid were normal.  Negative coronavirus, influenza and RSV PCR test.  Serum pregnancy test was negative.   Imaging: Portable 1 view chest radiograph with no active disease.   Aortic atherosclerosis.  CT abdomen/pelvis with contrast with suspicion for bilateral areas of pyelonephritis.  No repeat cyst.  No secondary changes seen in either kidney or ureter.  Normal appendix.  New cirrhosis.  Distal esophageal wall thickening with small hiatal hernia suspicious for esophagitis.  Multiple bilateral lower lobe pulmonary nodules.  Noncontrast chest CT follow-up in 12 months if high risk.  Aortic atherosclerosis.    Assessment and Plan:  # Acute pyelonephritis due to Proteus Mirabella's S/p ceftriaxone  2 g IVPB. Blood culture NGTD. Urine cultures growing Proteus mirabilis, sensitive to cephalosporin.  10/6 transition to cefadroxil  1000 mg p.o. twice daily for 5 days to complete 7-day course. Analgesics as needed. Antiemetics as needed. Advised to cease or decrease cola sodas consumption. Follow CBC, CMP in AM.    # Essential hypertension Monitor blood pressure.   Antihypertensives as needed.    # Diarrhea S/p IV fluids.  Continue oral hydration   # Alcohol use disorder According to the patient: -No recent alcohol consumption since prior discharge.   # Polysubstance abuse (HCC) Stated she used cocaine earlier this week. She denied any other recreational drugs. Advised to cease and seek counseling.   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 Patient was not aware of liver cirrhosis, CT scan shows cirrhosis of liver.  Patient was informed regarding the diagnosis Alcohol abstinence counseling done.  # Pulmonary nodules, incidental finding. Follow-up PCP to repeat CT chest after 12 months. Patient is high risk due to history of smoking.  # GERD, started PPI   Body mass index is 41.03 kg/m.  Interventions:  Diet: Regular diet DVT Prophylaxis: Subcutaneous  Lovenox    Advance goals of care discussion: Full code  Family Communication: family was present at bedside, at the time of interview.  The pt provided permission to discuss medical plan with the  family. Opportunity was given to ask question and all questions were answered satisfactorily.   Disposition:  Pt is from community, homeless, admitted with bilateral pyelonephritis, still on IV antibiotics, which precludes a safe discharge. Discharge place TBD, TOC is following, made a referral to inpatient rehab [facility], awaiting for response and also contacted [facility].  Stable to DC when patient has the safe dispo plan. Follow TOC.  Subjective: No significant events overnight.  Complaining of pain in the right lower quadrant area 7/10, stated that is getting better, no any other complaints.   Physical Exam: General: NAD, lying comfortably Appear in no distress, affect appropriate Eyes: PERRLA ENT: Oral Mucosa Clear, moist  Neck: no JVD,  Cardiovascular: S1 and S2 Present, no Murmur,  Respiratory: good respiratory effort, Bilateral Air entry equal and Decreased, no Crackles, no wheezes Abdomen: BS present, Soft and mild LLQ tenderness,  Skin: no rashes Extremities: no Pedal edema, no calf tenderness Neurologic: without any new focal findings Gait not checked due to patient safety concerns  Vitals:   05/27/24 1440 05/27/24 2103 05/28/24 0513 05/28/24 1326  BP: (!) 129/57 (!) 153/54 (!) 145/56 (!) 135/55  Pulse: 60 (!) 59 (!) 50 (!) 52  Resp: 17   16  Temp: 98.2 F (36.8 C) 98.1 F (36.7 C) 98.2 F (36.8 C) 98.4 F (36.9 C)  TempSrc:      SpO2:  99% 100% 99%  Weight:      Height:        Intake/Output Summary (Last 24 hours) at 05/28/2024 1503 Last data filed at 05/27/2024 1830 Gross per 24 hour  Intake 240 ml  Output --  Net 240 ml   Filed Weights   05/25/24 1048 05/25/24 1553  Weight: 108.9 kg 98.5 kg    Data Reviewed: I have personally reviewed and interpreted daily labs, tele strips, imagings as discussed above. I reviewed all nursing notes, pharmacy notes, vitals, pertinent old records I have discussed plan of care as described above with RN and  patient/family.  CBC: Recent Labs  Lab 05/25/24 1150 05/26/24 0541 05/27/24 0420 05/28/24 0441  WBC 10.3 6.3 5.3 4.3  NEUTROABS 7.2  --   --   --   HGB 13.3 11.9* 12.0 12.3  HCT 41.7 37.8 38.3 40.4  MCV 99.0 100.0 100.5* 103.3*  PLT 200 159 159 151   Basic Metabolic Panel: Recent Labs  Lab 05/25/24 1150 05/26/24 0541 05/27/24 0420 05/28/24 0441  NA 139 138 135 138  K 3.7 4.2 4.3 4.6  CL 103 107 108 107  CO2 24 22 19* 23  GLUCOSE 89 94 81 74  BUN 11 17 16 11   CREATININE 0.98 0.97 0.94 0.96  CALCIUM  9.6 8.5* 8.1* 8.6*  MG  --  1.9 2.1 2.1  PHOS  --  3.7 3.5 3.5    Studies: No results found.  Scheduled Meds:  busPIRone  10 mg Oral TID   cefadroxil   1,000 mg Oral BID   enoxaparin  (LOVENOX ) injection  40 mg Subcutaneous Q24H   pantoprazole   40 mg Oral BID   Continuous Infusions:   PRN Meds: acetaminophen  **OR** acetaminophen , ondansetron  **OR** ondansetron  (ZOFRAN ) IV, mouth rinse, oxyCODONE   Time spent: 35 minutes  Author: ELVAN SOR. MD [facility] 05/28/2024 3:03 PM  To reach  On-call, see care teams to locate the attending and reach out to them via [email or web address]. If 7PM-7AM, please contact night-coverage If you still have difficulty reaching the attending provider, please page the [facility] (Director on Call) for [facility] on amion for assistance.

## 2024-05-28 NOTE — Plan of Care (Signed)

## 2024-05-29 DIAGNOSIS — N10 Acute tubulo-interstitial nephritis: Secondary | ICD-10-CM | POA: Diagnosis not present

## 2024-05-29 LAB — BASIC METABOLIC PANEL WITH GFR
Anion gap: 11 (ref 5–15)
BUN: 15 mg/dL (ref 6–20)
CO2: 22 mmol/L (ref 22–32)
Calcium: 9 mg/dL (ref 8.9–10.3)
Chloride: 104 mmol/L (ref 98–111)
Creatinine, Ser: 0.89 mg/dL (ref 0.44–1.00)
GFR, Estimated: >60 mL/min (ref >60)
Glucose, Bld: 81 mg/dL (ref 70–99)
Potassium: 4.2 mmol/L (ref 3.5–5.1)
Sodium: 138 mmol/L (ref 135–145)

## 2024-05-29 LAB — CBC
HCT: 40 % (ref 36.0–46.0)
Hemoglobin: 12.8 g/dL (ref 12.0–15.0)
MCH: 31.8 pg (ref 26.0–34.0)
MCHC: 32 g/dL (ref 30.0–36.0)
MCV: 99.5 fL (ref 80.0–100.0)
Platelets: 175 K/uL (ref 150–400)
RBC: 4.02 MIL/uL (ref 3.87–5.11)
RDW: 12.2 % (ref 11.5–15.5)
WBC: 5.3 K/uL (ref 4.0–10.5)
nRBC: 0 % (ref 0.0–0.2)

## 2024-05-29 LAB — MAGNESIUM: Magnesium: 2.2 mg/dL (ref 1.7–2.4)

## 2024-05-29 LAB — PHOSPHORUS: Phosphorus: 3.7 mg/dL (ref 2.5–4.6)

## 2024-05-29 NOTE — Progress Notes (Signed)
 Progress Note   Patient: Judy Robles FMW:[id number] DOB: [birth year] DOA: 05/25/2024     4 DOS: the patient was seen and examined on 05/29/2024   Brief hospital course: Judy Robles is a 48 y.o. female with medical history significant of osteoarthritis, bipolar 1 disorder, depression, PTSD, hyperlipidemia, hypertension, MRSA infection, history of symptomatic bradycardia, alcohol abuse, benzodiazepine abuse, drug overdose, cocaine abuse, opioid abuse, unspecified renal disease, sleep apnea not on CPAP who was recently discharged from the hospital continue to AKI who presented to the emergency department complaints of bilateral flank pain for several days and fever since earlier today.  No dysuria, frequency or hematuria.  She also had chills, nausea, emesis, diarrhea, myalgias and arthralgias but her GI symptoms were present before the fever and flank pain.  No night sweats, rhinorrhea, sore throat, wheezing or hemoptysis.  No chest pain, palpitations, diaphoresis, PND, orthopnea or pitting edema of the lower extremities.  No  constipation, melena or hematochezia.  No polyuria, polydipsia, polyphagia or blurred vision.    ED course: Initial vital signs were temperature 100.4 F, pulse 65, respiration 14, BP 159/125 mmHg and O2 sat.  The patient received ceftriaxone  2 g IVPB, ceftriaxone  1 g IVPB, ketorolac  15 mg IVP, morphine  2 mg IVP, ondansetron  4 mg IVP and 1000 mL of normal saline bolus.   Lab work: Urinalysis was turbid, with moderate hemoglobin and moderate leukocyte esterase and greater than 300 mg/dL protein.  Microscopic examination with no bacteria, but greater than 50 RBC, greater than 50 WBC, positive WBC clumps and mucus.  CBC, PT/INR, CMP and lactic acid were normal.  Negative coronavirus, influenza and RSV PCR test.  Serum pregnancy test was negative.   Imaging: Portable 1 view chest radiograph with no active disease.  Aortic atherosclerosis.  CT abdomen/pelvis with contrast with suspicion  for bilateral areas of pyelonephritis.  No repeat cyst.  No secondary changes seen in either kidney or ureter.  Normal appendix.  New cirrhosis.  Distal esophageal wall thickening with small hiatal hernia suspicious for esophagitis.  Multiple bilateral lower lobe pulmonary nodules.  Noncontrast chest CT follow-up in 12 months if high risk.  Aortic atherosclerosis.  Assessment and Plan:  #Acute pyelonephritis secondary to Proteus mirabilis - Urine culture positive for Proteus mirabilis - Blood cultures showed no growth to date - Received 2 days of IV Rocephin  was then switched to p.o. cefadroxil  - Continue cefadroxil  1000 mg twice daily to complete 7 days total - As needed analgesics and antiemetics  #Diarrhea -resolved - Continue oral hydration  #Alcohol use disorder - Patient reported no recent alcohol consumption since prior discharge  #Polysubstance abuse - Patient reported using cocaine prior to admission - Patient was counseled on cessation of all recreational drugs  #Liver cirrhosis secondary to alcohol use - CT scan showed cirrhosis of the liver - Patient was notified and counseled on alcohol abstinence - Follow-up with PCP  #Pulmonary nodules, incidental findings - CT abdomen/pelvis showed multiple bilateral lower lobe pulmonary nodules - Recommend CT follow-up in 12 months given patient is high risk due to history of smoking  #GERD - Continue PPI  #Obesity - Body mass index is 41.03 kg/m. - Patient would benefit from PCP follow-up for weight loss and lifestyle modification     Subjective: Patient seen in bed this morning.  No acute events overnight per patient or RN staff.  Patient reports right flank pain, similar to previous.  Denies any abdominal pain, nausea, vomiting, chest pain, shortness of breath,  dysuria, hematuria.  Physical Exam: Vitals:   05/28/24 0513 05/28/24 1326 05/28/24 2007 05/29/24 0422  BP: (!) 145/56 (!) 135/55 139/62 (!) 156/63  Pulse: (!)  50 (!) 52 (!) 55 (!) 50  Resp:  16  17  Temp: 98.2 F (36.8 C) 98.4 F (36.9 C) 98.4 F (36.9 C) 98.1 F (36.7 C)  TempSrc:   Oral Oral  SpO2: 100% 99% 99% 99%  Weight:      Height:        Physical Exam Constitutional:      General: She is not in acute distress.    Appearance: She is obese. She is not ill-appearing.  HENT:     Mouth/Throat:     Mouth: Mucous membranes are moist.  Cardiovascular:     Rate and Rhythm: Normal rate and regular rhythm.     Heart sounds: Normal heart sounds. No murmur heard. Pulmonary:     Effort: Pulmonary effort is normal. No respiratory distress.     Breath sounds: Normal breath sounds. No wheezing.  Abdominal:     General: Bowel sounds are normal. There is no distension.     Palpations: Abdomen is soft.     Tenderness: There is no abdominal tenderness. There is right CVA tenderness. There is no guarding.  Musculoskeletal:     Right lower leg: No edema.     Left lower leg: No edema.  Skin:    General: Skin is warm and dry.     Capillary Refill: Capillary refill takes less than 2 seconds.  Neurological:     Mental Status: She is alert and oriented to person, place, and time. Mental status is at baseline.     Family Communication: Discussed with patient  Disposition: Status is: Inpatient Remains inpatient appropriate because: Acute pyelonephritis  Planned Discharge Destination: TOC following forresidential rehab placement, patient is from community, homeless.  Stable for discharge when safe disposition is secured.    Time spent: 40 minutes  Author: Duffy Larch, MD 05/29/2024 8:23 AM  For on call review [email or web address].

## 2024-05-29 NOTE — Hospital Course (Signed)
 Judy Robles is a 48 y.o. female with medical history significant of osteoarthritis, bipolar 1 disorder, depression, PTSD, hyperlipidemia, hypertension, MRSA infection, history of symptomatic bradycardia, alcohol abuse, benzodiazepine abuse, drug overdose, cocaine abuse, opioid abuse, unspecified renal disease, sleep apnea not on CPAP who was recently discharged from the hospital continue to AKI who presented to the emergency department complaints of bilateral flank pain for several days and fever since earlier today.  No dysuria, frequency or hematuria.  She also had chills, nausea, emesis, diarrhea, myalgias and arthralgias but her GI symptoms were present before the fever and flank pain.  No night sweats, rhinorrhea, sore throat, wheezing or hemoptysis.  No chest pain, palpitations, diaphoresis, PND, orthopnea or pitting edema of the lower extremities.  No  constipation, melena or hematochezia.  No polyuria, polydipsia, polyphagia or blurred vision.    ED course: Initial vital signs were temperature 100.4 F, pulse 65, respiration 14, BP 159/125 mmHg and O2 sat.  The patient received ceftriaxone  2 g IVPB, ceftriaxone  1 g IVPB, ketorolac  15 mg IVP, morphine  2 mg IVP, ondansetron  4 mg IVP and 1000 mL of normal saline bolus.   Lab work: Urinalysis was turbid, with moderate hemoglobin and moderate leukocyte esterase and greater than 300 mg/dL protein.  Microscopic examination with no bacteria, but greater than 50 RBC, greater than 50 WBC, positive WBC clumps and mucus.  CBC, PT/INR, CMP and lactic acid were normal.  Negative coronavirus, influenza and RSV PCR test.  Serum pregnancy test was negative.   Imaging: Portable 1 view chest radiograph with no active disease.  Aortic atherosclerosis.  CT abdomen/pelvis with contrast with suspicion for bilateral areas of pyelonephritis.  No repeat cyst.  No secondary changes seen in either kidney or ureter.  Normal appendix.  New cirrhosis.  Distal esophageal wall  thickening with small hiatal hernia suspicious for esophagitis.  Multiple bilateral lower lobe pulmonary nodules.  Noncontrast chest CT follow-up in 12 months if high risk.  Aortic atherosclerosis.

## 2024-05-29 NOTE — Plan of Care (Signed)

## 2024-05-29 NOTE — TOC Progression Note (Signed)
 Transition of Care [facility]) - Progression Note    Patient Details  Name: Judy Robles MRN: [id number] Date of Birth: 05/04/1976  Transition of Care [facility]) CM/SW Contact  Doneta Glenys DASEN, RN Phone Number: 05/29/2024, 2:24 PM  Clinical Narrative:    CM called [facility]. [name] patient was not accepted due to current medical concerns. Also called [facility] and instructed to have patient call with intake information Patient called and waiting on reply. CM and patient completed application for [facility] in [facility] and submitted online.   Expected Discharge Plan:  (Reidential Rehab) Barriers to Discharge: Continued Medical Work up, Homeless with medical needs               Expected Discharge Plan and Services In-house Referral: NA Discharge Planning Services: CM Consult Post Acute Care Choice: NA Living arrangements for the past 2 months: Homeless                 DME Arranged: N/A DME Agency: NA       HH Arranged: NA HH Agency: NA         Social Drivers of Health (SDOH) Interventions SDOH Screenings   Food Insecurity: Food Insecurity Present (05/25/2024)  Housing: High Risk (05/25/2024)  Transportation Needs: Unmet Transportation Needs (05/25/2024)  Utilities: Not At Risk (05/25/2024)  Alcohol Screen: High Risk (12/14/2023)  Depression (PHQ2-9): High Risk (05/02/2024)  Social Connections: Unknown (10/19/2022)   Received from [facility]  Tobacco Use: High Risk (05/25/2024)    Readmission Risk Interventions    05/28/2024   10:04 AM 05/19/2024   10:17 AM  Readmission Risk Prevention Plan  Transportation Screening Complete Complete  Medication Review [employer]) Complete Complete  PCP or Specialist appointment within 3-5 days of discharge  Complete  HRI or Home Care Consult Complete Complete  SW Recovery Care/Counseling Consult Complete Complete  Palliative Care Screening Not Applicable Not Applicable  Skilled Nursing Facility Not  Applicable Not Applicable

## 2024-05-29 NOTE — Progress Notes (Signed)
   05/29/24 2341  BiPAP/CPAP/SIPAP  BiPAP/CPAP/SIPAP Pt Type Adult  Reason BIPAP/CPAP not in use Non-compliant

## 2024-05-30 ENCOUNTER — Inpatient Hospital Stay (HOSPITAL_COMMUNITY): Payer: MEDICAID

## 2024-05-30 DIAGNOSIS — N10 Acute tubulo-interstitial nephritis: Secondary | ICD-10-CM | POA: Diagnosis not present

## 2024-05-30 DIAGNOSIS — R9431 Abnormal electrocardiogram [ECG] [EKG]: Secondary | ICD-10-CM | POA: Diagnosis not present

## 2024-05-30 LAB — ECHOCARDIOGRAM COMPLETE
Area-P 1/2: 2.8 cm2
Height: 61 in
MV M vel: 4.6 m/s
MV Peak grad: 84.6 mmHg
MV VTI: 2.12 cm2
S' Lateral: 2.4 cm
Weight: 3474.45 [oz_av]

## 2024-05-30 LAB — CBC
HCT: 39.7 % (ref 36.0–46.0)
Hemoglobin: 12.4 g/dL (ref 12.0–15.0)
MCH: 31 pg (ref 26.0–34.0)
MCHC: 31.2 g/dL (ref 30.0–36.0)
MCV: 99.3 fL (ref 80.0–100.0)
Platelets: 153 K/uL (ref 150–400)
RBC: 4 MIL/uL (ref 3.87–5.11)
RDW: 12.2 % (ref 11.5–15.5)
WBC: 4.5 K/uL (ref 4.0–10.5)
nRBC: 0 % (ref 0.0–0.2)

## 2024-05-30 LAB — BASIC METABOLIC PANEL WITH GFR
Anion gap: 9 (ref 5–15)
BUN: 16 mg/dL (ref 6–20)
CO2: 24 mmol/L (ref 22–32)
Calcium: 8.7 mg/dL — ABNORMAL LOW (ref 8.9–10.3)
Chloride: 104 mmol/L (ref 98–111)
Creatinine, Ser: 0.87 mg/dL (ref 0.44–1.00)
GFR, Estimated: >60 mL/min (ref >60)
Glucose, Bld: 100 mg/dL — ABNORMAL HIGH (ref 70–99)
Potassium: 4.3 mmol/L (ref 3.5–5.1)
Sodium: 137 mmol/L (ref 135–145)

## 2024-05-30 LAB — CULTURE, BLOOD (ROUTINE X 2)
Culture: NO GROWTH
Culture: NO GROWTH
Special Requests: ADEQUATE

## 2024-05-30 MED ORDER — NICOTINE 14 MG/24HR TD PT24
14.0000 mg | MEDICATED_PATCH | Freq: Every day | TRANSDERMAL | Status: DC
  Administered 2024-05-30 – 2024-05-31 (×2): 14 mg via TRANSDERMAL
  Filled 2024-05-30 (×2): qty 1

## 2024-05-30 NOTE — Progress Notes (Signed)
 Progress Note   Patient: Judy Robles FMW:[id number] DOB: 06/05/1976 DOA: 05/25/2024     5 DOS: the patient was seen and examined on 05/30/2024   Brief hospital course: Judy Robles is a 48 y.o. female with medical history significant of osteoarthritis, bipolar 1 disorder, depression, PTSD, hyperlipidemia, hypertension, MRSA infection, history of symptomatic bradycardia, alcohol abuse, benzodiazepine abuse, drug overdose, cocaine abuse, opioid abuse, unspecified renal disease, sleep apnea not on CPAP who was recently discharged from the hospital continue to AKI who presented to the emergency department complaints of bilateral flank pain for several days and fever since earlier today.  No dysuria, frequency or hematuria.  She also had chills, nausea, emesis, diarrhea, myalgias and arthralgias but her GI symptoms were present before the fever and flank pain.  No night sweats, rhinorrhea, sore throat, wheezing or hemoptysis.  No chest pain, palpitations, diaphoresis, PND, orthopnea or pitting edema of the lower extremities.  No  constipation, melena or hematochezia.  No polyuria, polydipsia, polyphagia or blurred vision.    ED course: Initial vital signs were temperature 100.4 F, pulse 65, respiration 14, BP 159/125 mmHg and O2 sat.  The patient received ceftriaxone  2 g IVPB, ceftriaxone  1 g IVPB, ketorolac  15 mg IVP, morphine  2 mg IVP, ondansetron  4 mg IVP and 1000 mL of normal saline bolus.   Lab work: Urinalysis was turbid, with moderate hemoglobin and moderate leukocyte esterase and greater than 300 mg/dL protein.  Microscopic examination with no bacteria, but greater than 50 RBC, greater than 50 WBC, positive WBC clumps and mucus.  CBC, PT/INR, CMP and lactic acid were normal.  Negative coronavirus, influenza and RSV PCR test.  Serum pregnancy test was negative.   Imaging: Portable 1 view chest radiograph with no active disease.  Aortic atherosclerosis.  CT abdomen/pelvis with contrast with suspicion  for bilateral areas of pyelonephritis.  No repeat cyst.  No secondary changes seen in either kidney or ureter.  Normal appendix.  New cirrhosis.  Distal esophageal wall thickening with small hiatal hernia suspicious for esophagitis.  Multiple bilateral lower lobe pulmonary nodules.  Noncontrast chest CT follow-up in 12 months if high risk.  Aortic atherosclerosis.  Assessment and Plan:  #Acute pyelonephritis secondary to Proteus mirabilis - Urine culture positive for Proteus mirabilis - Blood cultures showed no growth to date - Received 2 days of IV Rocephin  was then switched to p.o. cefadroxil  - Continue cefadroxil  1000 mg twice daily to complete 7 days total - As needed analgesics and antiemetics  # Bradycardia, asymptomatic # Widened pulse pressure - Patient was recently admitted on 05/04/19/2025 with symptomatic bradycardia.  At that time, EDP discussed with cardiology who recommended continuation of clonidine  and monitoring without any further cardiology intervention. Per chart review, patient was also noted to have a widened pulse pressure at that time.  Patient was discharged with a heart rate in the 50s with stable blood pressure and asymptomatic. - Will obtain an EKG and an echo to have updated studies - Patient would probably benefit from outpatient follow-up with cardiology for continued evaluation  #Diarrhea - resolved - Continue oral hydration  #Alcohol use disorder - Patient reported no recent alcohol consumption since prior discharge  #Polysubstance abuse - Patient reported using cocaine prior to admission - Patient was counseled on cessation of all recreational drugs  #Liver cirrhosis secondary to alcohol use - CT scan showed cirrhosis of the liver - Patient was notified and counseled on alcohol abstinence - Follow-up with PCP  #Pulmonary nodules, incidental  findings - CT abdomen/pelvis showed multiple bilateral lower lobe pulmonary nodules - Recommend CT follow-up in  12 months given patient is high risk due to history of smoking  #GERD - Continue PPI  # Class III/morbid obesity  (BMI Body mass index is 41.03 kg/m. kg/m) - Obesity is clinically significant and contributes to GERD - Discussed lifestyle modification including dietary changes, regular physical activity, and weight reduction strategies. Will continue to monitor weight and BMI during hospitalization.    Subjective: Patient seen in bed this morning.  No acute events overnight per patient or RN staff.  Denies any abdominal pain, nausea, vomiting, chest pain, shortness of breath, dysuria, hematuria.  Physical Exam: Vitals:   05/29/24 1233 05/29/24 1933 05/30/24 0514 05/30/24 0525  BP: (!) 155/62 (!) 144/59 (!) 147/56   Pulse: (!) 58 (!) 55 (!) 47 (!) 47  Resp: 16 19 18    Temp: 98.3 F (36.8 C) 98.3 F (36.8 C) 98.2 F (36.8 C)   TempSrc:  Oral    SpO2: 97% 99% 99% 98%  Weight:      Height:        Physical Exam Constitutional:      General: She is not in acute distress.    Appearance: She is obese. She is not ill-appearing.  HENT:     Mouth/Throat:     Mouth: Mucous membranes are moist.  Cardiovascular:     Rate and Rhythm: Normal rate and regular rhythm.     Heart sounds: Normal heart sounds. No murmur heard. Pulmonary:     Effort: Pulmonary effort is normal. No respiratory distress.     Breath sounds: Normal breath sounds. No wheezing.  Abdominal:     General: Bowel sounds are normal. There is no distension.     Palpations: Abdomen is soft.     Tenderness: There is no abdominal tenderness. There is right CVA tenderness (mild). There is no guarding.  Musculoskeletal:     Right lower leg: No edema.     Left lower leg: No edema.  Skin:    General: Skin is warm and dry.     Capillary Refill: Capillary refill takes less than 2 seconds.  Neurological:     Mental Status: She is alert and oriented to person, place, and time. Mental status is at baseline.     Family  Communication: Discussed with patient  Disposition: Status is: Inpatient Remains inpatient appropriate because: Acute pyelonephritis  Planned Discharge Destination: TOC following forresidential rehab placement, patient is from community, homeless.  Stable for discharge when safe disposition is secured.    Time spent: 40 minutes  Author: Duffy Larch, MD 05/30/2024 7:35 AM  For on call review [email or web address].

## 2024-05-30 NOTE — Plan of Care (Signed)

## 2024-05-30 NOTE — Plan of Care (Signed)

## 2024-05-30 NOTE — TOC Progression Note (Signed)
 Transition of Care [facility]) - Progression Note    Patient Details  Name: MARILI VADER MRN: [id number] Date of Birth: 1976/07/07  Transition of Care [facility]) CM/SW Contact  Doneta Glenys DASEN, RN Phone Number: 05/30/2024, 5:22 PM  Clinical Narrative:    CM discussed with patient that if a rehab does not accept her, she will will discharged to [facility]. No decide from [facility] in [place] or [place].   Expected Discharge Plan:  (Reidential Rehab) Barriers to Discharge: Continued Medical Work up, Homeless with medical needs               Expected Discharge Plan and Services In-house Referral: NA Discharge Planning Services: CM Consult Post Acute Care Choice: NA Living arrangements for the past 2 months: Homeless                 DME Arranged: N/A DME Agency: NA       HH Arranged: NA HH Agency: NA         Social Drivers of Health (SDOH) Interventions SDOH Screenings   Food Insecurity: Food Insecurity Present (05/25/2024)  Housing: High Risk (05/25/2024)  Transportation Needs: Unmet Transportation Needs (05/25/2024)  Utilities: Not At Risk (05/25/2024)  Alcohol Screen: High Risk (12/14/2023)  Depression (PHQ2-9): High Risk (05/02/2024)  Social Connections: Unknown (10/19/2022)   [facility]  Tobacco Use: High Risk (05/25/2024)    Readmission Risk Interventions    05/28/2024   10:04 AM 05/19/2024   10:17 AM  Readmission Risk Prevention Plan  Transportation Screening Complete Complete  Medication Review Oceanographer) Complete Complete  PCP or Specialist appointment within 3-5 days of discharge  Complete  HRI or Home Care Consult Complete Complete  SW Recovery Care/Counseling Consult Complete Complete  Palliative Care Screening Not Applicable Not Applicable  Skilled Nursing Facility Not Applicable Not Applicable

## 2024-05-30 NOTE — Progress Notes (Signed)
  Echocardiogram 2D Echocardiogram has been performed.  Koleen KANDICE Popper, RDCS 05/30/2024, 2:54 PM

## 2024-05-31 DIAGNOSIS — N10 Acute tubulo-interstitial nephritis: Secondary | ICD-10-CM | POA: Diagnosis not present

## 2024-05-31 NOTE — Discharge Summary (Signed)
 Physician Discharge Summary  Judy Robles FMW:[id number] DOB: [birth year] DOA: 05/25/2024  PCP: Pcp, No  Admit date: 05/25/2024 Discharge date: 05/31/2024 30 Day Unplanned Readmission Risk Score    Flowsheet Row ED to Hosp-Admission (Current) from 05/25/2024 in [facility]  30 Day Unplanned Readmission Risk Score (%) 39.35 Filed at 05/31/2024 0801    This score is the patient's risk of an unplanned readmission within 30 days of being discharged (0 -100%). The score is based on dignosis, age, lab data, medications, orders, and past utilization.   Low:  0-14.9   Medium: 15-21.9   High: 22-29.9   Extreme: 30 and above          Admitted From: Home Disposition: Home/shelter  Recommendations for Outpatient Follow-up:  Follow up with PCP in 1-2 weeks Please obtain BMP/CBC in one week Please follow up with your PCP on the following pending results: Unresulted Labs (From admission, onward)    None         Home Health: None Equipment/Devices: None none  Discharge Condition: Stable CODE STATUS: Full code Diet recommendation:  Diet Order             Diet regular Room service appropriate? Yes; Fluid consistency: Thin  Diet effective now                   Subjective: Seen and examined, feels well.  No complaints today.  Brief/Interim Summary: Judy Robles is a 48 y.o. female with medical history significant of osteoarthritis, bipolar 1 disorder, depression, PTSD, hyperlipidemia, hypertension, MRSA infection, history of symptomatic bradycardia, alcohol abuse, benzodiazepine abuse, drug overdose, cocaine abuse, opioid abuse, unspecified renal disease, sleep apnea not on CPAP who was recently discharged from the hospital due to AKI who presented to the emergency department complaints of bilateral flank pain for several days and fever associated with chills, nausea, vomiting, arthralgias and myalgias as well as diarrhea however her GI  symptoms are present prior to spiking fever.  Upon arrival to ED, she was found to have low-grade fever.  She was diagnosed with bilateral pyelonephritis, given antibiotics, admitted to hospital service.   #Acute pyelonephritis secondary to Proteus mirabilis - Urine culture positive for Proteus mirabilis, Blood cultures showed no growth to date - Received 2 days of IV Rocephin  was then switched to p.o. cefadroxil  which will be completed today before discharge.   # Bradycardia, asymptomatic # Widened pulse pressure - Patient was recently admitted on 05/04/19/2025 with symptomatic bradycardia.  At that time, EDP discussed with cardiology who recommended continuation of clonidine  and monitoring without any further cardiology intervention. Per chart review, patient was also noted to have a widened pulse pressure at that time.  Patient was discharged with a heart rate in the 50s with stable blood pressure and asymptomatic. - Echo was obtained for the sake of completeness which shows normal ejection fraction but grade 2 diastolic dysfunction.  Patient appears euvolemic. 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 kg/m) - Obesity is  clinically significant and contributes to GERD - Discussed lifestyle modification including dietary changes, regular physical activity, and weight reduction strategies. Will continue to monitor weight and BMI during hospitalization  Discharge plan was discussed with patient and/or family member and they verbalized understanding and agreed with it.  Discharge Diagnoses:  Principal Problem:   Acute pyelonephritis Active Problems:   Essential hypertension   Diarrhea   Alcohol use disorder   Polysubstance abuse (HCC)    Discharge Instructions   Allergies as of 05/31/2024       Reactions   Gramineae Pollens Itching   Lisinopril  Other (See Comments)   Shuts down kidneys   Mirtazapine  Other (See Comments)   Tremors        Medication List     TAKE these medications    gabapentin  400 MG capsule Commonly known as: NEURONTIN  Take 1 capsule (400 mg total) by mouth 3 (three) times daily.   oxyCODONE  5 MG immediate release tablet Commonly known as: Oxy IR/ROXICODONE  Take 1 tablet (5 mg total) by mouth every 6 (six) hours as needed for moderate pain (pain score 4-6) or severe pain (pain score 7-10).   senna 8.6 MG Tabs tablet Commonly known as: SENOKOT Take 2 tablets (17.2 mg total) by mouth 2 (two) times daily as needed for mild constipation or moderate constipation.   Vitamin D  (Ergocalciferol ) 1.25 MG (50000 UNIT) Caps capsule Commonly known as: DRISDOL  Take 1 capsule (50,000 Units total) by mouth every 7 (seven) days.        Follow-up Information     PCP Follow up in 1 week(s).                 Allergies  Allergen Reactions   Gramineae Pollens Itching   Lisinopril  Other (See Comments)    Shuts down kidneys   Mirtazapine  Other (See Comments)    Tremors    Consultations: None.   Procedures/Studies: ECHOCARDIOGRAM COMPLETE Result Date: 05/30/2024    ECHOCARDIOGRAM REPORT   Patient Name:   Judy Robles Date of Exam: 05/30/2024 Medical Rec #:  [id number]      Height:       61.0 in Accession #:    [id number]    Weight:       217.2 lb Date of Birth:  10/18/1975     BSA:          1.956 m Patient Age:    48 years      BP:           123/66 mmHg Patient Gender: F             HR:           61 bpm. Exam Location:  Inpatient Procedure: 2D Echo, Cardiac Doppler and Color Doppler (Both Spectral and Color            Flow Doppler were utilized during procedure). Indications:    Abnormal ECG R94.31  History:        Patient has prior history of Echocardiogram examinations, most                 recent 09/03/2017. Abnormal ECG, Arrythmias:Bradycardia; Risk  Factors:Hypertension, PSA, Current Smoker, Sleep Apnea and                 Dyslipidemia.  Sonographer:    Koleen Popper RDCS Referring Phys: [id number] [facility]  Sonographer Comments: Image acquisition challenging due to respiratory motion. IMPRESSIONS  1. Left ventricular ejection fraction, by estimation, is 70 to 75%. The left ventricle has hyperdynamic function. The left ventricle has no regional wall motion abnormalities. There is moderate asymmetric left ventricular hypertrophy of the basal-septal  segment. Left ventricular diastolic parameters are consistent with Grade II diastolic dysfunction (pseudonormalization).  2. Right ventricular systolic function is normal. The right ventricular size is normal. There is mildly elevated pulmonary artery systolic pressure. The estimated right ventricular systolic pressure is 36.6 mmHg.  3. Left atrial size was moderately dilated.  4. The mitral valve is normal in structure. Trivial mitral valve regurgitation. No evidence of mitral stenosis. Moderate mitral annular calcification.  5. The aortic valve is tricuspid. There is mild calcification of the aortic valve. Aortic valve regurgitation is not visualized. Aortic valve sclerosis/calcification is present, without any evidence of aortic stenosis.  6. The inferior vena cava is normal in size with greater than 50%  respiratory variability, suggesting right atrial pressure of 3 mmHg. FINDINGS  Left Ventricle: Left ventricular ejection fraction, by estimation, is 70 to 75%. The left ventricle has hyperdynamic function. The left ventricle has no regional wall motion abnormalities. The left ventricular internal cavity size was normal in size. There is moderate asymmetric left ventricular hypertrophy of the basal-septal segment. Left ventricular diastolic parameters are consistent with Grade II diastolic dysfunction (pseudonormalization). Right Ventricle: The right ventricular size is normal. No increase in right ventricular wall thickness. Right ventricular systolic function is normal. There is mildly elevated pulmonary artery systolic pressure. The tricuspid regurgitant velocity is 2.90  m/s, and with an assumed right atrial pressure of 3 mmHg, the estimated right ventricular systolic pressure is 36.6 mmHg. Left Atrium: Left atrial size was moderately dilated. Right Atrium: Right atrial size was normal in size. Pericardium: There is no evidence of pericardial effusion. Mitral Valve: The mitral valve is normal in structure. Moderate mitral annular calcification. Trivial mitral valve regurgitation. No evidence of mitral valve stenosis. MV peak gradient, 7.3 mmHg. The mean mitral valve gradient is 2.0 mmHg. Tricuspid Valve: The tricuspid valve is normal in structure. Tricuspid valve regurgitation is trivial. No evidence of tricuspid stenosis. Aortic Valve: The aortic valve is tricuspid. There is mild calcification of the aortic valve. Aortic valve regurgitation is not visualized. Aortic valve sclerosis/calcification is present, without any evidence of aortic stenosis. Pulmonic Valve: The pulmonic valve was normal in structure. Pulmonic valve regurgitation is not visualized. No evidence of pulmonic stenosis. Aorta: The aortic root is normal in size and structure. Venous: The inferior vena cava is normal in size with greater than 50%  respiratory variability, suggesting right atrial pressure of 3 mmHg. IAS/Shunts: No atrial level shunt detected by color flow Doppler.  LEFT VENTRICLE PLAX 2D LVIDd:         4.90 cm   Diastology LVIDs:         2.40 cm   LV e' medial:    4.35 cm/s LV PW:         0.90 cm   LV E/e' medial:  28.0 LV IVS:        1.40 cm   LV e' lateral:   5.55 cm/s LVOT diam:     2.00 cm   LV E/e'  lateral: 22.0 LV SV:         103 LV SV Index:   53 LVOT Area:     3.14 cm  RIGHT VENTRICLE             IVC RV Basal diam:  4.00 cm     IVC diam: 1.90 cm RV S prime:     17.80 cm/s TAPSE (M-mode): 2.3 cm LEFT ATRIUM             Index        RIGHT ATRIUM           Index LA diam:        4.20 cm 2.15 cm/m   RA Area:     11.60 cm LA Vol (A2C):   75.2 ml 38.45 ml/m  RA Volume:   26.20 ml  13.39 ml/m LA Vol (A4C):   75.3 ml 38.50 ml/m LA Biplane Vol: 75.6 ml 38.65 ml/m  AORTIC VALVE LVOT Vmax:   149.00 cm/s LVOT Vmean:  95.300 cm/s LVOT VTI:    0.328 m  AORTA Ao Root diam: 2.60 cm Ao Asc diam:  2.80 cm MITRAL VALVE                TRICUSPID VALVE MV Area (PHT): 2.80 cm     TR Peak grad:   33.6 mmHg MV Area VTI:   2.12 cm     TR Vmax:        290.00 cm/s MV Peak grad:  7.3 mmHg MV Mean grad:  2.0 mmHg     SHUNTS MV Vmax:       1.36 m/s     Systemic VTI:  0.33 m MV Vmean:      65.4 cm/s    Systemic Diam: 2.00 cm MV Decel Time: 271 msec MR Peak grad: 84.6 mmHg MR Vmax:      460.00 cm/s MV E velocity: 122.00 cm/s MV A velocity: 112.00 cm/s MV E/A ratio:  1.09 Toribio Fuel MD Electronically signed by Toribio Fuel MD Signature Date/Time: 05/30/2024/3:20:26 PM    Final    CT ABDOMEN PELVIS W CONTRAST Result Date: 05/25/2024 CLINICAL DATA:  Right flank pain. Evaluate for stone or appendicitis. EXAM: CT ABDOMEN AND PELVIS WITH CONTRAST TECHNIQUE: Multidetector CT imaging of the abdomen and pelvis was performed using the standard protocol following bolus administration of intravenous contrast. RADIATION DOSE REDUCTION: This exam was performed  according to the departmental dose-optimization program which includes automated exposure control, adjustment of the Robles and/or kV according to patient size and/or use of iterative reconstruction technique. CONTRAST:  OMNIPAQUE IOHEXOL 300 MG/ML  SOLN COMPARISON:  05/17/2024 FINDINGS: Lower chest: 4 mm right lower lobe subpleural nodule on 15/5 is stable, potentially a subpleural lymph node. 4 mm left lower lobe nodule on 23/5 was not definitely seen previously. 4 mm more anterior left lower lobe pulmonary nodule is seen on the same image, similar to prior. Other scattered tiny pulmonary nodules are seen in both lower lungs. No pleural effusion. Hepatobiliary: No suspicious focal abnormality within the liver parenchyma. Nodular liver contour is most pronounced over the left hepatic lobe and suggests underlying cirrhosis. There is no evidence for gallstones, gallbladder wall thickening, or pericholecystic fluid. Common bile duct diameter 7 mm is borderline to mildly increased. Pancreas: No focal mass lesion. No dilatation of the main duct. No intraparenchymal cyst. No peripancreatic edema. Spleen: Calcified granuloma noted anteriorly. Adrenals/Urinary Tract: No adrenal nodule or mass. Areas of focal cortical scarring are  seen in both kidneys wedge-shaped area of striated decreased perfusion in the interpolar left kidney (images 12 and 13 of series 11 is suspicious for pyelonephritis. Similar possible segmental edema seen posterior right kidney on 15/11. No evidence for hydroureter. Bladder wall is ill-defined without substantial thickening. Stomach/Bowel: Circumferential wall thickening noted distal esophagus. Small hiatal hernia noted. Stomach is unremarkable. No gastric wall thickening. No evidence of outlet obstruction. Duodenum is normally positioned as is the ligament of Treitz. No small bowel wall thickening. No small bowel dilatation. The terminal ileum is normal. The appendix is normal. No gross colonic  mass. No colonic wall thickening. Vascular/Lymphatic: There is mild atherosclerotic calcification of the abdominal aorta without aneurysm. Mildly enlarged 11 mm short axis portal caval lymph node on 23/2 is stable and may be reactive. Additional small hepatoduodenal ligament lymph nodes evident. No retroperitoneal lymphadenopathy. No pelvic sidewall lymphadenopathy. Reproductive: The uterus is unremarkable.  There is no adnexal mass. Other: No intraperitoneal free fluid. Musculoskeletal: No worrisome lytic or sclerotic osseous abnormality. IMPRESSION: 1. Areas of focal cortical scarring in both kidneys with wedge-shaped area of striated decreased perfusion in the interpolar left kidney and posterior right kidney. Imaging features are suspicious for pyelonephritis. 2. No evidence for urinary stone disease. No secondary changes in either kidney or ureter. 3. Normal appendix. 4. Nodular liver contour suggests underlying cirrhosis. 5. Circumferential wall thickening in the distal esophagus with small hiatal hernia. Esophagitis would be a consideration. 6. Multiple tiny bilateral lower lobe pulmonary nodules measuring up to 4 mm. Some of these are stable in the short interval since prior study. Previous CT chest of 09/03/2017 showed relatively diffuse bilateral airspace disease. As such, the findings may reflect scarring. No follow-up needed if patient is low-risk (and has no known or suspected primary neoplasm). Non-contrast chest CT can be considered in 12 months if patient is high-risk. This recommendation follows the consensus statement: Guidelines for Management of Incidental Pulmonary Nodules Detected on CT Images: From the Fleischner Society 2017; Radiology 2017; 284:228-243. 7.  Aortic Atherosclerosis (ICD10-I70.0). Electronically Signed   By: Camellia Candle M.D.   On: 05/25/2024 13:29   DG Chest Port 1 View Result Date: 05/25/2024 CLINICAL DATA:  Questionable sepsis - evaluate for abnormality EXAM: PORTABLE  CHEST 1 VIEW COMPARISON:  Chest x-ray 07/22/2023 FINDINGS: The heart and mediastinal contours are unchanged. Atherosclerotic plaque. No focal consolidation. No pulmonary edema. No pleural effusion. No pneumothorax. No acute osseous abnormality. IMPRESSION: 1. No active disease. 2.  Aortic Atherosclerosis (ICD10-I70.0). Electronically Signed   By: Morgane  Naveau M.D.   On: 05/25/2024 11:36   CT ABDOMEN PELVIS WO CONTRAST Result Date: 05/17/2024 EXAM: CT ABDOMEN AND PELVIS WITHOUT CONTRAST 05/17/2024 08:40:37 PM TECHNIQUE: CT of the abdomen and pelvis was performed without the administration of intravenous contrast. Multiplanar reformatted images are provided for review. Automated exposure control, iterative reconstruction, and/or weight-based adjustment of the Robles/kV was utilized to reduce the radiation dose to as low as reasonably achievable. COMPARISON: CT abdomen and pelvis 11/19/2017. CLINICAL HISTORY: Abdominal pain, acute, nonlocalized. FINDINGS: LOWER CHEST: No acute abnormality. LIVER: No acute abnormality. GALLBLADDER AND BILE DUCTS: Gallbladder is unremarkable. No biliary ductal dilatation. SPLEEN: No acute abnormality. PANCREAS: No acute abnormality. ADRENAL GLANDS: No acute abnormality. KIDNEYS, URETERS AND BLADDER: No stones in the kidneys or ureters. No hydronephrosis. No perinephric or periureteral stranding. Urinary bladder is unremarkable. GI AND BOWEL: Stomach demonstrates no acute abnormality. There is no bowel obstruction. Normal appendix. PERITONEUM AND RETROPERITONEUM: No ascites. No free air.  VASCULATURE: Aorta is normal in caliber. Aortic atherosclerotic calcification. LYMPH NODES: No lymphadenopathy. REPRODUCTIVE ORGANS: No acute abnormality. BONES AND SOFT TISSUES: No acute osseous abnormality. No focal soft tissue abnormality. IMPRESSION: 1. No acute findings in the abdomen or pelvis. Electronically signed by: Norman Gatlin MD 05/17/2024 09:00 PM EDT RP Workstation: [id number]      Discharge Exam: Vitals:   05/30/24 2010 05/31/24 0519  BP: (!) 142/84 128/68  Pulse: (!) 54 (!) 54  Resp: 18 18  Temp: 98.5 F (36.9 C) 98 F (36.7 C)  SpO2: 100% 100%   Vitals:   05/30/24 0525 05/30/24 1456 05/30/24 2010 05/31/24 0519  BP:  (!) 156/59 (!) 142/84 128/68  Pulse: (!) 47 (!) 52 (!) 54 (!) 54  Resp:  18 18 18   Temp:  98.4 F (36.9 C) 98.5 F (36.9 C) 98 F (36.7 C)  TempSrc:      SpO2: 98% 100% 100% 100%  Weight:      Height:        General: Pt is alert, awake, not in acute distress Cardiovascular: RRR, S1/S2 +, no rubs, no gallops Respiratory: CTA bilaterally, no wheezing, no rhonchi Abdominal: Soft, NT, ND, bowel sounds + Extremities: no edema, no cyanosis    The results of significant diagnostics from this hospitalization (including imaging, microbiology, ancillary and laboratory) are listed below for reference.     Microbiology: Recent Results (from the past 240 hours)  Blood Culture (routine x 2)     Status: None   Collection Time: 05/25/24 11:40 AM   Specimen: BLOOD  Result Value Ref Range Status   Specimen Description   Final    BLOOD RIGHT ANTECUBITAL Performed at [facility], [address], [place], [place] [address]    Special Requests   Final    BOTTLES DRAWN AEROBIC AND ANAEROBIC Blood Culture adequate volume Performed at [facility], [address] [place], [place] [address]    Culture   Final    NO GROWTH 5 DAYS Performed at [facility], [address], [place], [place] [address]    Report Status 05/30/2024 FINAL  Final  Blood Culture (routine x 2)     Status: None   Collection Time: 05/25/24 11:45 AM   Specimen: BLOOD  Result Value Ref Range Status   Specimen Description   Final    BLOOD LEFT ANTECUBITAL Performed at [facility], [address], [place], [place] [address]    Special Requests   Final    BOTTLES DRAWN AEROBIC AND ANAEROBIC Blood  Culture results may not be optimal due to an inadequate volume of blood received in culture bottles Performed at [facility], [address], [place], [place] [address]    Culture   Final    NO GROWTH 5 DAYS Performed at [facility], [address], [place], [place] [address]    Report Status 05/30/2024 FINAL  Final  Resp panel by RT-PCR (RSV, Flu A&B, Covid) Anterior Nasal Swab     Status: None   Collection Time: 05/25/24 12:34 PM   Specimen: Anterior Nasal Swab  Result Value Ref Range Status   SARS Coronavirus 2 by RT PCR NEGATIVE NEGATIVE Final    Comment: (NOTE) SARS-CoV-2 target nucleic acids are NOT DETECTED.  The SARS-CoV-2 RNA is generally detectable in upper respiratory specimens during the acute phase of infection. The lowest concentration of SARS-CoV-2 viral copies this assay can detect is 138 copies/mL.  A negative result does not preclude SARS-Cov-2 infection and should not be used as the sole basis for treatment or other patient management decisions. A negative result may occur with  improper specimen collection/handling, submission of specimen other than nasopharyngeal swab, presence of viral mutation(s) within the areas targeted by this assay, and inadequate number of viral copies(<138 copies/mL). A negative result must be combined with clinical observations, patient history, and epidemiological information. The expected result is Negative.  Fact Sheet for Patients:  BloggerCourse.com  Fact Sheet for Healthcare Providers:  SeriousBroker.it  This test is no t yet approved or cleared by the [place]  FDA and  has been authorized for detection and/or diagnosis of SARS-CoV-2 by FDA under an Emergency Use Authorization (EUA). This EUA will remain  in effect (meaning this test can be used) for the duration of the COVID-19 declaration under Section 564(b)(1) of the Act, 21 U.S.C.section  360bbb-3(b)(1), unless the authorization is terminated  or revoked sooner.       Influenza A by PCR NEGATIVE NEGATIVE Final   Influenza B by PCR NEGATIVE NEGATIVE Final    Comment: (NOTE) The Xpert Xpress SARS-CoV-2/FLU/RSV plus assay is intended as an aid in the diagnosis of influenza from Nasopharyngeal swab specimens and should not be used as a sole basis for treatment. Nasal washings and aspirates are unacceptable for Xpert Xpress SARS-CoV-2/FLU/RSV testing.  Fact Sheet for Patients: BloggerCourse.com  Fact Sheet for Healthcare Providers: SeriousBroker.it  This test is not yet approved or cleared by the [place]  FDA and has been authorized for detection and/or diagnosis of SARS-CoV-2 by FDA under an Emergency Use Authorization (EUA). This EUA will remain in effect (meaning this test can be used) for the duration of the COVID-19 declaration under Section 564(b)(1) of the Act, 21 U.S.C. section 360bbb-3(b)(1), unless the authorization is terminated or revoked.     Resp Syncytial Virus by PCR NEGATIVE NEGATIVE Final    Comment: (NOTE) Fact Sheet for Patients: BloggerCourse.com  Fact Sheet for Healthcare Providers: SeriousBroker.it  This test is not yet approved or cleared by the [place]  FDA and has been authorized for detection and/or diagnosis of SARS-CoV-2 by FDA under an Emergency Use Authorization (EUA). This EUA will remain in effect (meaning this test can be used) for the duration of the COVID-19 declaration under Section 564(b)(1) of the Act, 21 U.S.C. section 360bbb-3(b)(1), unless the authorization is terminated or revoked.  Performed at [facility], [address], [place], [place] [address]   Urine Culture     Status: Abnormal   Collection Time: 05/25/24  3:43 PM   Specimen: Urine, Clean Catch  Result Value Ref Range Status    Specimen Description   Final    URINE, CLEAN CATCH Performed at [facility], [address], [place], [place] [address]    Special Requests   Final    NONE Performed at [facility], [address] [place], [place] [address]    Culture >=100,000 COLONIES/mL PROTEUS MIRABILIS (A)  Final   Report Status 05/27/2024 FINAL  Final   Organism ID, Bacteria PROTEUS MIRABILIS (A)  Final      Susceptibility   Proteus mirabilis - MIC*    AMPICILLIN <=2 SENSITIVE Sensitive     CEFAZOLIN (URINE) Value in next row Sensitive      4 SENSITIVEThis is a modified FDA-approved test that has been validated and its performance characteristics determined by the reporting laboratory.  This laboratory is certified under the  Clinical Laboratory Improvement Amendments CLIA as qualified to perform high complexity clinical laboratory testing.    CEFEPIME  Value in next row Sensitive      4 SENSITIVEThis is a modified FDA-approved test that has been validated and its performance characteristics determined by the reporting laboratory.  This laboratory is certified under the Clinical Laboratory Improvement Amendments CLIA as qualified to perform high complexity clinical laboratory testing.    ERTAPENEM Value in next row Sensitive      4 SENSITIVEThis is a modified FDA-approved test that has been validated and its performance characteristics determined by the reporting laboratory.  This laboratory is certified under the Clinical Laboratory Improvement Amendments CLIA as qualified to perform high complexity clinical laboratory testing.    CEFTRIAXONE  Value in next row Sensitive      4 SENSITIVEThis is a modified FDA-approved test that has been validated and its performance characteristics determined by the reporting laboratory.  This laboratory is certified under the Clinical Laboratory Improvement Amendments CLIA as qualified to perform high complexity clinical laboratory testing.     CIPROFLOXACIN Value in next row Sensitive      4 SENSITIVEThis is a modified FDA-approved test that has been validated and its performance characteristics determined by the reporting laboratory.  This laboratory is certified under the Clinical Laboratory Improvement Amendments CLIA as qualified to perform high complexity clinical laboratory testing.    GENTAMICIN Value in next row Sensitive      4 SENSITIVEThis is a modified FDA-approved test that has been validated and its performance characteristics determined by the reporting laboratory.  This laboratory is certified under the Clinical Laboratory Improvement Amendments CLIA as qualified to perform high complexity clinical laboratory testing.    NITROFURANTOIN Value in next row Resistant      4 SENSITIVEThis is a modified FDA-approved test that has been validated and its performance characteristics determined by the reporting laboratory.  This laboratory is certified under the Clinical Laboratory Improvement Amendments CLIA as qualified to perform high complexity clinical laboratory testing.    TRIMETH /SULFA  Value in next row Resistant      4 SENSITIVEThis is a modified FDA-approved test that has been validated and its performance characteristics determined by the reporting laboratory.  This laboratory is certified under the Clinical Laboratory Improvement Amendments CLIA as qualified to perform high complexity clinical laboratory testing.    AMPICILLIN/SULBACTAM Value in next row Sensitive      4 SENSITIVEThis is a modified FDA-approved test that has been validated and its performance characteristics determined by the reporting laboratory.  This laboratory is certified under the Clinical Laboratory Improvement Amendments CLIA as qualified to perform high complexity clinical laboratory testing.    PIP/TAZO Value in next row Sensitive      <=4 SENSITIVEThis is a modified FDA-approved test that has been validated and its performance characteristics  determined by the reporting laboratory.  This laboratory is certified under the Clinical Laboratory Improvement Amendments CLIA as qualified to perform high complexity clinical laboratory testing.    MEROPENEM Value in next row Sensitive      <=4 SENSITIVEThis is a modified FDA-approved test that has been validated and its performance characteristics determined by the reporting laboratory.  This laboratory is certified under the Clinical Laboratory Improvement Amendments CLIA as qualified to perform high complexity clinical laboratory testing.    * >=100,000 COLONIES/mL PROTEUS MIRABILIS     Labs: BNP (last 3 results) No results for input(s): BNP in the last 8760 hours. Basic Metabolic Panel: Recent Labs  Lab  05/26/24 0541 05/27/24 0420 05/28/24 0441 05/29/24 0445 05/30/24 0442  NA 138 135 138 138 137  K 4.2 4.3 4.6 4.2 4.3  CL 107 108 107 104 104  CO2 22 19* 23 22 24   GLUCOSE 94 81 74 81 100*  BUN 17 16 11 15 16   CREATININE 0.97 0.94 0.96 0.89 0.87  CALCIUM  8.5* 8.1* 8.6* 9.0 8.7*  MG 1.9 2.1 2.1 2.2  --   PHOS 3.7 3.5 3.5 3.7  --    Liver Function Tests: Recent Labs  Lab 05/25/24 1150 05/26/24 0541  AST 36 25  ALT 39 28  ALKPHOS 60 45  BILITOT 0.5 0.3  PROT 7.7 6.3*  ALBUMIN 4.3 3.4*   No results for input(s): LIPASE, AMYLASE in the last 168 hours. No results for input(s): AMMONIA in the last 168 hours. CBC: Recent Labs  Lab 05/25/24 1150 05/26/24 0541 05/27/24 0420 05/28/24 0441 05/29/24 0445 05/30/24 0442  WBC 10.3 6.3 5.3 4.3 5.3 4.5  NEUTROABS 7.2  --   --   --   --   --   HGB 13.3 11.9* 12.0 12.3 12.8 12.4  HCT 41.7 37.8 38.3 40.4 40.0 39.7  MCV 99.0 100.0 100.5* 103.3* 99.5 99.3  PLT 200 159 159 151 175 153   Cardiac Enzymes: No results for input(s): CKTOTAL, CKMB, CKMBINDEX, TROPONINI in the last 168 hours. BNP: Invalid input(s): POCBNP CBG: No results for input(s): GLUCAP in the last 168 hours. D-Dimer No results for  input(s): DDIMER in the last 72 hours. Hgb A1c No results for input(s): HGBA1C in the last 72 hours. Lipid Profile No results for input(s): CHOL, HDL, LDLCALC, TRIG, CHOLHDL, LDLDIRECT in the last 72 hours. Thyroid  function studies No results for input(s): TSH, T4TOTAL, T3FREE, THYROIDAB in the last 72 hours.  Invalid input(s): FREET3 Anemia work up No results for input(s): VITAMINB12, FOLATE, FERRITIN, TIBC, IRON , RETICCTPCT in the last 72 hours. Urinalysis    Component Value Date/Time   COLORURINE YELLOW 05/25/2024 1351   APPEARANCEUR TURBID (A) 05/25/2024 1351   LABSPEC 1.016 05/25/2024 1351   PHURINE 6.0 05/25/2024 1351   GLUCOSEU NEGATIVE 05/25/2024 1351   HGBUR MODERATE (A) 05/25/2024 1351   BILIRUBINUR NEGATIVE 05/25/2024 1351   KETONESUR NEGATIVE 05/25/2024 1351   PROTEINUR >=300 (A) 05/25/2024 1351   UROBILINOGEN 0.2 07/03/2015 0618   NITRITE NEGATIVE 05/25/2024 1351   LEUKOCYTESUR MODERATE (A) 05/25/2024 1351   Sepsis Labs Recent Labs  Lab 05/27/24 0420 05/28/24 0441 05/29/24 0445 05/30/24 0442  WBC 5.3 4.3 5.3 4.5   Microbiology Recent Results (from the past 240 hours)  Blood Culture (routine x 2)     Status: None   Collection Time: 05/25/24 11:40 AM   Specimen: BLOOD  Result Value Ref Range Status   Specimen Description   Final    BLOOD RIGHT ANTECUBITAL Performed at [facility], [address], [place], [place] [address]    Special Requests   Final    BOTTLES DRAWN AEROBIC AND ANAEROBIC Blood Culture adequate volume Performed at [facility], [address], [place], [place] [address]    Culture   Final    NO GROWTH 5 DAYS Performed at [facility], [address], [place], [place] [address]    Report Status 05/30/2024 FINAL  Final  Blood Culture (routine x 2)     Status: None   Collection Time: 05/25/24 11:45 AM   Specimen: BLOOD  Result Value Ref Range Status  Specimen Description   Final    BLOOD LEFT ANTECUBITAL Performed at [facility], [address], [place], [place] [address]    Special Requests   Final    BOTTLES DRAWN AEROBIC AND ANAEROBIC Blood Culture results may not be optimal due to an inadequate volume of blood received in culture bottles Performed at [facility], [address] [place], [place] [address]    Culture   Final    NO GROWTH 5 DAYS Performed at [facility], [address], [place], [place] [address]    Report Status 05/30/2024 FINAL  Final  Resp panel by RT-PCR (RSV, Flu A&B, Covid) Anterior Nasal Swab     Status: None   Collection Time: 05/25/24 12:34 PM   Specimen: Anterior Nasal Swab  Result Value Ref Range Status   SARS Coronavirus 2 by RT PCR NEGATIVE NEGATIVE Final    Comment: (NOTE) SARS-CoV-2 target nucleic acids are NOT DETECTED.  The SARS-CoV-2 RNA is generally detectable in upper respiratory specimens during the acute phase of infection. The lowest concentration of SARS-CoV-2 viral copies this assay can detect is 138 copies/mL. A negative result does not preclude SARS-Cov-2 infection and should not be used as the sole basis for treatment or other patient management decisions. A negative result may occur with  improper specimen collection/handling, submission of specimen other than nasopharyngeal swab, presence of viral mutation(s) within the areas targeted by this assay, and inadequate number of viral copies(<138 copies/mL). A negative result must be combined with clinical observations, patient history, and epidemiological information. The expected result is Negative.  Fact Sheet for Patients:  BloggerCourse.com  Fact Sheet for Healthcare Providers:  SeriousBroker.it  This test is no t yet approved or cleared by the [place]  FDA and  has been authorized for detection and/or diagnosis of  SARS-CoV-2 by FDA under an Emergency Use Authorization (EUA). This EUA will remain  in effect (meaning this test can be used) for the duration of the COVID-19 declaration under Section 564(b)(1) of the Act, 21 U.S.C.section 360bbb-3(b)(1), unless the authorization is terminated  or revoked sooner.       Influenza A by PCR NEGATIVE NEGATIVE Final   Influenza B by PCR NEGATIVE NEGATIVE Final    Comment: (NOTE) The Xpert Xpress SARS-CoV-2/FLU/RSV plus assay is intended as an aid in the diagnosis of influenza from Nasopharyngeal swab specimens and should not be used as a sole basis for treatment. Nasal washings and aspirates are unacceptable for Xpert Xpress SARS-CoV-2/FLU/RSV testing.  Fact Sheet for Patients: BloggerCourse.com  Fact Sheet for Healthcare Providers: SeriousBroker.it  This test is not yet approved or cleared by the [place]  FDA and has been authorized for detection and/or diagnosis of SARS-CoV-2 by FDA under an Emergency Use Authorization (EUA). This EUA will remain in effect (meaning this test can be used) for the duration of the COVID-19 declaration under Section 564(b)(1) of the Act, 21 U.S.C. section 360bbb-3(b)(1), unless the authorization is terminated or revoked.     Resp Syncytial Virus by PCR NEGATIVE NEGATIVE Final    Comment: (NOTE) Fact Sheet for Patients: BloggerCourse.com  Fact Sheet for Healthcare Providers: SeriousBroker.it  This test is not yet approved or cleared by the [place]  FDA and has been authorized for detection and/or diagnosis of SARS-CoV-2 by FDA under an Emergency Use Authorization (EUA). This EUA will remain in effect (meaning this test can be used) for the duration of the COVID-19 declaration under Section 564(b)(1) of the Act,  21 U.S.C. section 360bbb-3(b)(1), unless the authorization is terminated  or revoked.  Performed at [facility], [address], [place], [place] [address]   Urine Culture     Status: Abnormal   Collection Time: 05/25/24  3:43 PM   Specimen: Urine, Clean Catch  Result Value Ref Range Status   Specimen Description   Final    URINE, CLEAN CATCH Performed at [facility], [address] [place], [place] [address]    Special Requests   Final    NONE Performed at [facility], [address], [place], [place] [address]    Culture >=100,000 COLONIES/mL PROTEUS MIRABILIS (A)  Final   Report Status 05/27/2024 FINAL  Final   Organism ID, Bacteria PROTEUS MIRABILIS (A)  Final      Susceptibility   Proteus mirabilis - MIC*    AMPICILLIN <=2 SENSITIVE Sensitive     CEFAZOLIN (URINE) Value in next row Sensitive      4 SENSITIVEThis is a modified FDA-approved test that has been validated and its performance characteristics determined by the reporting laboratory.  This laboratory is certified under the Clinical Laboratory Improvement Amendments CLIA as qualified to perform high complexity clinical laboratory testing.    CEFEPIME  Value in next row Sensitive      4 SENSITIVEThis is a modified FDA-approved test that has been validated and its performance characteristics determined by the reporting laboratory.  This laboratory is certified under the Clinical Laboratory Improvement Amendments CLIA as qualified to perform high complexity clinical laboratory testing.    ERTAPENEM Value in next row Sensitive      4 SENSITIVEThis is a modified FDA-approved test that has been validated and its performance characteristics determined by the reporting laboratory.  This laboratory is certified under the Clinical Laboratory Improvement Amendments CLIA as qualified to perform high complexity clinical laboratory testing.    CEFTRIAXONE  Value in next row Sensitive      4 SENSITIVEThis is a modified FDA-approved test that has been  validated and its performance characteristics determined by the reporting laboratory.  This laboratory is certified under the Clinical Laboratory Improvement Amendments CLIA as qualified to perform high complexity clinical laboratory testing.    CIPROFLOXACIN Value in next row Sensitive      4 SENSITIVEThis is a modified FDA-approved test that has been validated and its performance characteristics determined by the reporting laboratory.  This laboratory is certified under the Clinical Laboratory Improvement Amendments CLIA as qualified to perform high complexity clinical laboratory testing.    GENTAMICIN Value in next row Sensitive      4 SENSITIVEThis is a modified FDA-approved test that has been validated and its performance characteristics determined by the reporting laboratory.  This laboratory is certified under the Clinical Laboratory Improvement Amendments CLIA as qualified to perform high complexity clinical laboratory testing.    NITROFURANTOIN Value in next row Resistant      4 SENSITIVEThis is a modified FDA-approved test that has been validated and its performance characteristics determined by the reporting laboratory.  This laboratory is certified under the Clinical Laboratory Improvement Amendments CLIA as qualified to perform high complexity clinical laboratory testing.    TRIMETH /SULFA  Value in next row Resistant      4 SENSITIVEThis is a modified FDA-approved test that has been validated and its performance characteristics determined by the reporting laboratory.  This laboratory is certified under the Clinical Laboratory Improvement Amendments CLIA as qualified to perform high complexity clinical laboratory testing.  AMPICILLIN/SULBACTAM Value in next row Sensitive      4 SENSITIVEThis is a modified FDA-approved test that has been validated and its performance characteristics determined by the reporting laboratory.  This laboratory is certified under the Clinical Laboratory Improvement  Amendments CLIA as qualified to perform high complexity clinical laboratory testing.    PIP/TAZO Value in next row Sensitive      <=4 SENSITIVEThis is a modified FDA-approved test that has been validated and its performance characteristics determined by the reporting laboratory.  This laboratory is certified under the Clinical Laboratory Improvement Amendments CLIA as qualified to perform high complexity clinical laboratory testing.    MEROPENEM Value in next row Sensitive      <=4 SENSITIVEThis is a modified FDA-approved test that has been validated and its performance characteristics determined by the reporting laboratory.  This laboratory is certified under the Clinical Laboratory Improvement Amendments CLIA as qualified to perform high complexity clinical laboratory testing.    * >=100,000 COLONIES/mL PROTEUS MIRABILIS    FURTHER DISCHARGE INSTRUCTIONS:   Get Medicines reviewed and adjusted: Please take all your medications with you for your next visit with your Primary MD   Laboratory/radiological data: Please request your Primary MD to go over all hospital tests and procedure/radiological results at the follow up, please ask your Primary MD to get all Hospital records sent to his/her office.   In some cases, they will be blood work, cultures and biopsy results pending at the time of your discharge. Please request that your primary care M.D. goes through all the records of your hospital data and follows up on these results.   Also Note the following: If you experience worsening of your admission symptoms, develop shortness of breath, life threatening emergency, suicidal or homicidal thoughts you must seek medical attention immediately by calling 911 or calling your MD immediately  if symptoms less severe.   You must read complete instructions/literature along with all the possible adverse reactions/side effects for all the Medicines you take and that have been prescribed to you. Take any  new Medicines after you have completely understood and accpet all the possible adverse reactions/side effects.    patient was instructed, not to drive, operate heavy machinery, perform activities at heights, swimming or participation in water  activities or provide baby-sitting services while on Pain, Sleep and Anxiety Medications; until their outpatient Physician has advised to do so again. Also recommended to not to take more than prescribed Pain, Sleep and Anxiety Medications.  It is not advisable to combine anxiety, sleep and pain medications without talking with your primary care provider.     Wear Seat belts while driving.   Please note: You were cared for by a hospitalist during your hospital stay. Once you are discharged, your primary care physician will handle any further medical issues. Please note that NO REFILLS for any discharge medications will be authorized once you are discharged, as it is imperative that you return to your primary care physician (or establish a relationship with a primary care physician if you do not have one) for your post hospital discharge needs so that they can reassess your need for medications and monitor your lab values  Time coordinating discharge: Over 30 minutes  SIGNED:   Fredia Skeeter, MD  [facility] 05/31/2024, 11:12 AM *Please note that this is a verbal dictation therefore any spelling or grammatical errors are due to the Dragon Medical One system interpretation. If 7PM-7AM, please contact night-coverage [email or web address]

## 2024-05-31 NOTE — Plan of Care (Addendum)
 Pt requesting pain medication, MD confirmed it's ok to wait to DC this pt until next scheduled pain med at around 1230   --------  Pt received, educated on, and understands AVS discharge packet.  All IV accesses Dc'd.  Pt has all belongings.  Pt given bus pass.

## 2024-05-31 NOTE — TOC Transition Note (Signed)
 Transition of Care [facility]) - Discharge Note   Patient Details  Name: Judy Robles MRN: [id number] Date of Birth: [birth year]  Transition of [facility]) CM/SW Contact:  NORMAN ASPEN, LCSW Phone Number: 05/31/2024, 10:20 AM   Clinical Narrative:     Pt medically cleared for dc today.  Pt aware and has not received any notice of acceptance from local SA rehab programs.  She is agreed with dc and plans to dc to [facility].  Bus pass provided.  No further IP CM needs.  Final next level of care: Homeless Shelter Barriers to Discharge: Barriers Resolved   Patient Goals and CMS Choice Patient states their goals for this hospitalization and ongoing recovery are:: Find a home CMS Medicare.gov Compare Post Acute Care list provided to::  (NA) Choice offered to / list presented to : NA [place] ownership interest in [facility].provided to:: Parent NA    Discharge Placement                       Discharge Plan and Services Additional resources added to the After Visit Summary for   In-house Referral: NA Discharge Planning Services: CM Consult Post Acute Care Choice: NA          DME Arranged: N/A DME Agency: NA       HH Arranged: NA HH Agency: NA        Social Drivers of Health (SDOH) Interventions SDOH Screenings   Food Insecurity: Food Insecurity Present (05/25/2024)  Housing: High Risk (05/25/2024)  Transportation Needs: Unmet Transportation Needs (05/25/2024)  Utilities: Not At Risk (05/25/2024)  Alcohol Screen: High Risk (12/14/2023)  Depression (PHQ2-9): High Risk (05/02/2024)  Social Connections: Unknown (10/19/2022)   Received from [facility]  Tobacco Use: High Risk (05/25/2024)     Readmission Risk Interventions    05/28/2024   10:04 AM 05/19/2024   10:17 AM  Readmission Risk Prevention Plan  Transportation Screening Complete Complete  Medication Review Oceanographer) Complete Complete  PCP or Specialist appointment within 3-5 days of discharge   Complete  HRI or Home Care Consult Complete Complete  SW Recovery Care/Counseling Consult Complete Complete  Palliative Care Screening Not Applicable Not Applicable  Skilled Nursing Facility Not Applicable Not Applicable

## 2024-05-31 NOTE — Plan of Care (Signed)

## 2024-06-06 ENCOUNTER — Emergency Department (HOSPITAL_COMMUNITY): Payer: MEDICAID

## 2024-06-06 ENCOUNTER — Other Ambulatory Visit: Payer: Self-pay

## 2024-06-06 ENCOUNTER — Emergency Department (HOSPITAL_COMMUNITY)
Admission: EM | Admit: 2024-06-06 | Discharge: 2024-06-07 | Disposition: A | Discharge status: Home / Self Care | Payer: MEDICAID | Attending: Emergency Medicine | Admitting: Emergency Medicine

## 2024-06-06 DIAGNOSIS — R7989 Other specified abnormal findings of blood chemistry: Secondary | ICD-10-CM | POA: Diagnosis not present

## 2024-06-06 DIAGNOSIS — R780 Finding of alcohol in blood: Secondary | ICD-10-CM | POA: Insufficient documentation

## 2024-06-06 DIAGNOSIS — S7001XA Contusion of right hip, initial encounter: Secondary | ICD-10-CM | POA: Diagnosis not present

## 2024-06-06 DIAGNOSIS — Y903 Blood alcohol level of 60-79 mg/100 ml: Secondary | ICD-10-CM | POA: Insufficient documentation

## 2024-06-06 DIAGNOSIS — S7011XA Contusion of right thigh, initial encounter: Secondary | ICD-10-CM | POA: Diagnosis not present

## 2024-06-06 DIAGNOSIS — I10 Essential (primary) hypertension: Secondary | ICD-10-CM | POA: Diagnosis not present

## 2024-06-06 DIAGNOSIS — S79911A Unspecified injury of right hip, initial encounter: Secondary | ICD-10-CM | POA: Diagnosis present

## 2024-06-06 LAB — COMPREHENSIVE METABOLIC PANEL WITH GFR
ALT: 96 U/L — ABNORMAL HIGH (ref 0–44)
AST: 72 U/L — ABNORMAL HIGH (ref 15–41)
Albumin: 4.4 g/dL (ref 3.5–5.0)
Alkaline Phosphatase: 55 U/L (ref 38–126)
Anion gap: 13 (ref 5–15)
BUN: 16 mg/dL (ref 6–20)
CO2: 18 mmol/L — ABNORMAL LOW (ref 22–32)
Calcium: 9.4 mg/dL (ref 8.9–10.3)
Chloride: 105 mmol/L (ref 98–111)
Creatinine, Ser: 1.09 mg/dL — ABNORMAL HIGH (ref 0.44–1.00)
GFR, Estimated: >60 mL/min (ref >60)
Glucose, Bld: 89 mg/dL (ref 70–99)
Potassium: 3.7 mmol/L (ref 3.5–5.1)
Sodium: 136 mmol/L (ref 135–145)
Total Bilirubin: 0.3 mg/dL (ref 0.0–1.2)
Total Protein: 7.8 g/dL (ref 6.5–8.1)

## 2024-06-06 LAB — CBC
HCT: 36.6 % (ref 36.0–46.0)
Hemoglobin: 11.6 g/dL — ABNORMAL LOW (ref 12.0–15.0)
MCH: 31.2 pg (ref 26.0–34.0)
MCHC: 31.7 g/dL (ref 30.0–36.0)
MCV: 98.4 fL (ref 80.0–100.0)
Platelets: 231 K/uL (ref 150–400)
RBC: 3.72 MIL/uL — ABNORMAL LOW (ref 3.87–5.11)
RDW: 12.5 % (ref 11.5–15.5)
WBC: 7.4 K/uL (ref 4.0–10.5)
nRBC: 0 % (ref 0.0–0.2)

## 2024-06-06 LAB — ETHANOL: Alcohol, Ethyl (B): 77 mg/dL — ABNORMAL HIGH (ref <15)
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
## 2024-06-06 NOTE — ED Notes (Signed)
 Pt cleared for po fluids by PA. Offered water and coke per pt request
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
## 2024-06-07 ENCOUNTER — Emergency Department (HOSPITAL_COMMUNITY): Payer: MEDICAID

## 2024-06-07 MED ORDER — IBUPROFEN 200 MG PO TABS
600.0000 mg | ORAL_TABLET | Freq: Once | ORAL | Status: AC
  Administered 2024-06-07: 600 mg via ORAL
  Filled 2024-06-07: qty 3
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
## 2024-06-07 NOTE — ED Notes (Signed)
 Upon discharge patient requested to wait for brother to pick her up. RN agreed and Mudlogger nurse know.

## 2024-06-24 ENCOUNTER — Ambulatory Visit (HOSPITAL_BASED_OUTPATIENT_CLINIC_OR_DEPARTMENT_OTHER): Admitting: Family Medicine
